# Patient Record
Sex: Female | Born: 1935 | Race: White | Hispanic: No | Marital: Married | Smoking: Former smoker
Health system: Southern US, Community
[De-identification: ages and names within clinical notes are randomized; demographics above are authoritative.]

## PROBLEM LIST (undated history)

## (undated) DIAGNOSIS — C50912 Malignant neoplasm of unspecified site of left female breast: Secondary | ICD-10-CM

## (undated) DIAGNOSIS — H3530 Unspecified macular degeneration: Secondary | ICD-10-CM

## (undated) DIAGNOSIS — Z923 Personal history of irradiation: Secondary | ICD-10-CM

## (undated) DIAGNOSIS — Z950 Presence of cardiac pacemaker: Secondary | ICD-10-CM

## (undated) DIAGNOSIS — T4145XA Adverse effect of unspecified anesthetic, initial encounter: Secondary | ICD-10-CM

## (undated) DIAGNOSIS — T8859XA Other complications of anesthesia, initial encounter: Secondary | ICD-10-CM

## (undated) DIAGNOSIS — C4492 Squamous cell carcinoma of skin, unspecified: Secondary | ICD-10-CM

## (undated) DIAGNOSIS — I219 Acute myocardial infarction, unspecified: Secondary | ICD-10-CM

## (undated) DIAGNOSIS — D649 Anemia, unspecified: Secondary | ICD-10-CM

## (undated) DIAGNOSIS — Z8679 Personal history of other diseases of the circulatory system: Secondary | ICD-10-CM

## (undated) DIAGNOSIS — M1990 Unspecified osteoarthritis, unspecified site: Secondary | ICD-10-CM

## (undated) DIAGNOSIS — Z87442 Personal history of urinary calculi: Secondary | ICD-10-CM

## (undated) DIAGNOSIS — G459 Transient cerebral ischemic attack, unspecified: Secondary | ICD-10-CM

## (undated) DIAGNOSIS — G629 Polyneuropathy, unspecified: Secondary | ICD-10-CM

## (undated) DIAGNOSIS — E119 Type 2 diabetes mellitus without complications: Secondary | ICD-10-CM

## (undated) DIAGNOSIS — E785 Hyperlipidemia, unspecified: Secondary | ICD-10-CM

## (undated) DIAGNOSIS — R011 Cardiac murmur, unspecified: Secondary | ICD-10-CM

## (undated) DIAGNOSIS — I499 Cardiac arrhythmia, unspecified: Secondary | ICD-10-CM

## (undated) DIAGNOSIS — I2510 Atherosclerotic heart disease of native coronary artery without angina pectoris: Secondary | ICD-10-CM

## (undated) DIAGNOSIS — T7840XA Allergy, unspecified, initial encounter: Secondary | ICD-10-CM

## (undated) DIAGNOSIS — I4891 Unspecified atrial fibrillation: Secondary | ICD-10-CM

## (undated) HISTORY — PX: CATARACT EXTRACTION: SUR2

## (undated) HISTORY — DX: Type 2 diabetes mellitus without complications: E11.9

## (undated) HISTORY — DX: Unspecified macular degeneration: H35.30

## (undated) HISTORY — DX: Polyneuropathy, unspecified: G62.9

## (undated) HISTORY — DX: Allergy, unspecified, initial encounter: T78.40XA

## (undated) HISTORY — PX: TONSILLECTOMY AND ADENOIDECTOMY: SUR1326

## (undated) HISTORY — DX: Unspecified atrial fibrillation: I48.91

## (undated) HISTORY — DX: Anemia, unspecified: D64.9

## (undated) HISTORY — DX: Transient cerebral ischemic attack, unspecified: G45.9

## (undated) HISTORY — DX: Acute myocardial infarction, unspecified: I21.9

## (undated) HISTORY — DX: Personal history of other diseases of the circulatory system: Z86.79

## (undated) HISTORY — PX: ABDOMINAL HYSTERECTOMY: SHX81

## (undated) HISTORY — PX: OTHER SURGICAL HISTORY: SHX169

## (undated) HISTORY — DX: Unspecified osteoarthritis, unspecified site: M19.90

## (undated) HISTORY — DX: Atherosclerotic heart disease of native coronary artery without angina pectoris: I25.10

## (undated) HISTORY — DX: Hyperlipidemia, unspecified: E78.5

## (undated) HISTORY — DX: Cardiac murmur, unspecified: R01.1

## (undated) HISTORY — PX: BLADDER SURGERY: SHX569

## (undated) HISTORY — DX: Presence of cardiac pacemaker: Z95.0

---
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
## 1986-01-18 HISTORY — PX: BREAST EXCISIONAL BIOPSY: SUR124

## 1986-09-14 HISTORY — PX: BREAST SURGERY: SHX581

## 1987-09-15 HISTORY — PX: CARDIAC CATHETERIZATION: SHX172

## 1994-09-14 HISTORY — PX: RECTOCELE REPAIR: SHX761

## 1999-07-07 ENCOUNTER — Ambulatory Visit (HOSPITAL_COMMUNITY): Admission: RE | Admit: 1999-07-07 | Discharge: 1999-07-07 | Payer: Self-pay | Admitting: Interventional Cardiology

## 2002-09-14 DIAGNOSIS — I4891 Unspecified atrial fibrillation: Secondary | ICD-10-CM

## 2002-09-14 HISTORY — DX: Unspecified atrial fibrillation: I48.91

## 2003-10-08 ENCOUNTER — Inpatient Hospital Stay (HOSPITAL_COMMUNITY): Admission: AD | Admit: 2003-10-08 | Discharge: 2003-10-11 | Payer: Self-pay | Admitting: Interventional Cardiology

## 2003-11-13 ENCOUNTER — Other Ambulatory Visit: Payer: Self-pay

## 2006-09-14 DIAGNOSIS — I219 Acute myocardial infarction, unspecified: Secondary | ICD-10-CM

## 2006-09-14 HISTORY — DX: Acute myocardial infarction, unspecified: I21.9

## 2006-09-14 HISTORY — PX: OTHER SURGICAL HISTORY: SHX169

## 2007-04-13 ENCOUNTER — Inpatient Hospital Stay: Payer: Self-pay | Admitting: Internal Medicine

## 2007-04-13 ENCOUNTER — Other Ambulatory Visit: Payer: Self-pay

## 2007-06-16 ENCOUNTER — Ambulatory Visit: Payer: Self-pay | Admitting: Internal Medicine

## 2008-09-14 LAB — HM COLONOSCOPY

## 2009-10-08 ENCOUNTER — Ambulatory Visit: Payer: Self-pay | Admitting: Internal Medicine

## 2010-06-14 DIAGNOSIS — Z950 Presence of cardiac pacemaker: Secondary | ICD-10-CM

## 2010-06-14 HISTORY — DX: Presence of cardiac pacemaker: Z95.0

## 2010-06-19 ENCOUNTER — Ambulatory Visit: Payer: Self-pay | Admitting: Cardiology

## 2010-06-24 ENCOUNTER — Ambulatory Visit: Payer: Self-pay | Admitting: Cardiology

## 2011-07-07 ENCOUNTER — Ambulatory Visit: Payer: Self-pay | Admitting: Internal Medicine

## 2011-07-08 ENCOUNTER — Ambulatory Visit: Payer: Self-pay | Admitting: Internal Medicine

## 2013-03-31 DIAGNOSIS — K922 Gastrointestinal hemorrhage, unspecified: Secondary | ICD-10-CM | POA: Insufficient documentation

## 2013-03-31 DIAGNOSIS — I341 Nonrheumatic mitral (valve) prolapse: Secondary | ICD-10-CM | POA: Insufficient documentation

## 2013-03-31 DIAGNOSIS — K5790 Diverticulosis of intestine, part unspecified, without perforation or abscess without bleeding: Secondary | ICD-10-CM | POA: Insufficient documentation

## 2013-09-20 DIAGNOSIS — E119 Type 2 diabetes mellitus without complications: Secondary | ICD-10-CM | POA: Diagnosis not present

## 2013-09-20 DIAGNOSIS — E785 Hyperlipidemia, unspecified: Secondary | ICD-10-CM | POA: Diagnosis not present

## 2013-09-20 LAB — HEPATIC FUNCTION PANEL
ALT: 23 U/L (ref 7–35)
AST: 23 U/L (ref 13–35)
Alkaline Phosphatase: 63 U/L (ref 25–125)
Bilirubin, Total: 0.4 mg/dL

## 2013-09-20 LAB — LIPID PANEL
Cholesterol: 237 mg/dL — AB (ref 0–200)
HDL: 62 mg/dL (ref 35–70)
LDL Cholesterol: 137 mg/dL
Triglycerides: 190 mg/dL — AB (ref 40–160)

## 2013-09-20 LAB — BASIC METABOLIC PANEL
BUN: 13 mg/dL (ref 4–21)
Creatinine: 0.8 mg/dL (ref 0.5–1.1)
Glucose: 141 mg/dL
Potassium: 4.9 mmol/L (ref 3.4–5.3)
Sodium: 140 mmol/L (ref 137–147)

## 2013-09-20 LAB — HEMOGLOBIN A1C: Hgb A1c MFr Bld: 7.4 % — AB (ref 4.0–6.0)

## 2013-09-22 DIAGNOSIS — H353290 Exudative age-related macular degeneration, unspecified eye, stage unspecified: Secondary | ICD-10-CM | POA: Diagnosis not present

## 2013-09-26 DIAGNOSIS — I495 Sick sinus syndrome: Secondary | ICD-10-CM | POA: Diagnosis not present

## 2013-09-27 DIAGNOSIS — E119 Type 2 diabetes mellitus without complications: Secondary | ICD-10-CM | POA: Diagnosis not present

## 2013-09-27 DIAGNOSIS — I259 Chronic ischemic heart disease, unspecified: Secondary | ICD-10-CM | POA: Diagnosis not present

## 2013-10-06 DIAGNOSIS — H353290 Exudative age-related macular degeneration, unspecified eye, stage unspecified: Secondary | ICD-10-CM | POA: Diagnosis not present

## 2013-10-12 DIAGNOSIS — D485 Neoplasm of uncertain behavior of skin: Secondary | ICD-10-CM | POA: Diagnosis not present

## 2013-10-12 DIAGNOSIS — C44721 Squamous cell carcinoma of skin of unspecified lower limb, including hip: Secondary | ICD-10-CM | POA: Diagnosis not present

## 2013-10-24 DIAGNOSIS — K922 Gastrointestinal hemorrhage, unspecified: Secondary | ICD-10-CM | POA: Diagnosis not present

## 2013-10-24 DIAGNOSIS — D509 Iron deficiency anemia, unspecified: Secondary | ICD-10-CM | POA: Diagnosis not present

## 2013-11-03 DIAGNOSIS — H353290 Exudative age-related macular degeneration, unspecified eye, stage unspecified: Secondary | ICD-10-CM | POA: Diagnosis not present

## 2013-11-20 DIAGNOSIS — H353290 Exudative age-related macular degeneration, unspecified eye, stage unspecified: Secondary | ICD-10-CM | POA: Diagnosis not present

## 2013-11-23 DIAGNOSIS — C44721 Squamous cell carcinoma of skin of unspecified lower limb, including hip: Secondary | ICD-10-CM | POA: Diagnosis not present

## 2013-12-11 DIAGNOSIS — R079 Chest pain, unspecified: Secondary | ICD-10-CM | POA: Diagnosis not present

## 2013-12-11 DIAGNOSIS — I2510 Atherosclerotic heart disease of native coronary artery without angina pectoris: Secondary | ICD-10-CM | POA: Diagnosis not present

## 2013-12-11 DIAGNOSIS — I10 Essential (primary) hypertension: Secondary | ICD-10-CM | POA: Diagnosis not present

## 2013-12-11 DIAGNOSIS — I4891 Unspecified atrial fibrillation: Secondary | ICD-10-CM | POA: Diagnosis not present

## 2013-12-19 DIAGNOSIS — R079 Chest pain, unspecified: Secondary | ICD-10-CM | POA: Diagnosis not present

## 2013-12-25 DIAGNOSIS — H353290 Exudative age-related macular degeneration, unspecified eye, stage unspecified: Secondary | ICD-10-CM | POA: Diagnosis not present

## 2014-01-01 DIAGNOSIS — H353290 Exudative age-related macular degeneration, unspecified eye, stage unspecified: Secondary | ICD-10-CM | POA: Diagnosis not present

## 2014-01-01 DIAGNOSIS — E119 Type 2 diabetes mellitus without complications: Secondary | ICD-10-CM | POA: Diagnosis not present

## 2014-01-01 DIAGNOSIS — I10 Essential (primary) hypertension: Secondary | ICD-10-CM | POA: Diagnosis not present

## 2014-01-01 DIAGNOSIS — I4891 Unspecified atrial fibrillation: Secondary | ICD-10-CM | POA: Diagnosis not present

## 2014-01-08 ENCOUNTER — Ambulatory Visit (INDEPENDENT_AMBULATORY_CARE_PROVIDER_SITE_OTHER): Payer: Medicare Other

## 2014-01-08 ENCOUNTER — Ambulatory Visit: Payer: Medicare Other | Admitting: Podiatry

## 2014-01-08 ENCOUNTER — Encounter: Payer: Self-pay | Admitting: Podiatry

## 2014-01-08 VITALS — Resp 16 | Ht 64.0 in | Wt 163.0 lb

## 2014-01-08 DIAGNOSIS — B351 Tinea unguium: Secondary | ICD-10-CM

## 2014-01-08 DIAGNOSIS — E1149 Type 2 diabetes mellitus with other diabetic neurological complication: Secondary | ICD-10-CM

## 2014-01-08 DIAGNOSIS — M79609 Pain in unspecified limb: Secondary | ICD-10-CM | POA: Diagnosis not present

## 2014-01-08 DIAGNOSIS — Q828 Other specified congenital malformations of skin: Secondary | ICD-10-CM | POA: Diagnosis not present

## 2014-01-08 DIAGNOSIS — M79673 Pain in unspecified foot: Secondary | ICD-10-CM

## 2014-01-08 NOTE — Progress Notes (Signed)
   Subjective:    Patient ID: Holly Rose, female    DOB: 29-Mar-1930, 80 y.o.   MRN: [id number]  HPI Comments: Its both of my feet that hurt. They have been hurting for about 3 yrs and they are getting worse. i have callus on both as well. i soak my feet and try to pull the hard skin off. They hurt with shoes.   Foot Pain Associated symptoms include fatigue and numbness.      Review of Systems  Constitutional: Positive for fatigue.  HENT:       Sinus problems  Eyes: Positive for visual disturbance.  Respiratory: Negative.   Cardiovascular: Positive for palpitations.  Endocrine: Positive for cold intolerance.  Musculoskeletal:       Joint pain  Neurological: Positive for numbness.  All other systems reviewed and are negative.      Objective:   Physical Exam: I have reviewed her past medical history medications allergies surgeries social history and review of systems. Pulses are strongly palpable bilateral. Neurologic since him a slightly decreased per since once the monofilament to the level of the midfoot. Deep tendon reflexes are intact bilateral brisk and equal. Muscle strength is 5 over 5 dorsiflexors plantar flexors inverters everters all intrinsic musculature is intact. Orthopedic evaluation does demonstrate mild hammertoe deformities bilateral with plantar flexed metatarsals resulting in reactive hyperkeratosis. Cutaneous evaluation demonstrates supple well hydrated cutis her toenails are thick yellow dystrophic onychomycotic painful palpation as well as debridement. She also has plantar reactive hyperkeratosis to the plantar aspect of the bilateral forefoot.        Assessment & Plan:  Assessment: Diabetes with diabetic peripheral neuropathy. Pain in limb secondary to onychomycosis bilateral. Pain in limb secondary to porokeratosis plantar aspect of the bilateral foot.  Plan: Debridement all reactive hyperkeratosis. Debridement all nails 1 through 5 bilateral secondary to  pain. Dispensed silicone metatarsal heads bilateral.

## 2014-01-10 DIAGNOSIS — J019 Acute sinusitis, unspecified: Secondary | ICD-10-CM | POA: Diagnosis not present

## 2014-01-29 DIAGNOSIS — H353290 Exudative age-related macular degeneration, unspecified eye, stage unspecified: Secondary | ICD-10-CM | POA: Diagnosis not present

## 2014-02-02 DIAGNOSIS — H353290 Exudative age-related macular degeneration, unspecified eye, stage unspecified: Secondary | ICD-10-CM | POA: Diagnosis not present

## 2014-02-26 DIAGNOSIS — H353290 Exudative age-related macular degeneration, unspecified eye, stage unspecified: Secondary | ICD-10-CM | POA: Diagnosis not present

## 2014-02-28 DIAGNOSIS — E119 Type 2 diabetes mellitus without complications: Secondary | ICD-10-CM | POA: Diagnosis not present

## 2014-02-28 DIAGNOSIS — E789 Disorder of lipoprotein metabolism, unspecified: Secondary | ICD-10-CM | POA: Diagnosis not present

## 2014-02-28 DIAGNOSIS — R7989 Other specified abnormal findings of blood chemistry: Secondary | ICD-10-CM | POA: Diagnosis not present

## 2014-03-01 LAB — HEMOGLOBIN A1C: Hgb A1c MFr Bld: 8.3 % — AB (ref 4.0–6.0)

## 2014-03-01 LAB — CBC AND DIFFERENTIAL
HCT: 43 % (ref 36–46)
Hemoglobin: 14.2 g/dL (ref 12.0–16.0)
Platelets: 194 10*3/uL (ref 150–399)
WBC: 6.3 10^3/mL

## 2014-03-01 LAB — HEPATIC FUNCTION PANEL
ALT: 26 U/L (ref 7–35)
AST: 25 U/L (ref 13–35)
Alkaline Phosphatase: 61 U/L (ref 25–125)
Bilirubin, Total: 0.4 mg/dL

## 2014-03-01 LAB — LIPID PANEL
Cholesterol: 204 mg/dL — AB (ref 0–200)
HDL: 60 mg/dL (ref 35–70)
LDL Cholesterol: 110 mg/dL
Triglycerides: 168 mg/dL — AB (ref 40–160)

## 2014-03-01 LAB — BASIC METABOLIC PANEL
BUN: 13 mg/dL (ref 4–21)
Creatinine: 0.8 mg/dL (ref 0.5–1.1)
Glucose: 141 mg/dL
Potassium: 4.6 mmol/L (ref 3.4–5.3)
Sodium: 138 mmol/L (ref 137–147)

## 2014-03-06 DIAGNOSIS — H353290 Exudative age-related macular degeneration, unspecified eye, stage unspecified: Secondary | ICD-10-CM | POA: Diagnosis not present

## 2014-03-07 DIAGNOSIS — E119 Type 2 diabetes mellitus without complications: Secondary | ICD-10-CM | POA: Diagnosis not present

## 2014-03-07 DIAGNOSIS — I4891 Unspecified atrial fibrillation: Secondary | ICD-10-CM | POA: Diagnosis not present

## 2014-03-07 DIAGNOSIS — I259 Chronic ischemic heart disease, unspecified: Secondary | ICD-10-CM | POA: Diagnosis not present

## 2014-03-07 DIAGNOSIS — G589 Mononeuropathy, unspecified: Secondary | ICD-10-CM | POA: Diagnosis not present

## 2014-03-30 DIAGNOSIS — H353290 Exudative age-related macular degeneration, unspecified eye, stage unspecified: Secondary | ICD-10-CM | POA: Diagnosis not present

## 2014-04-03 DIAGNOSIS — I495 Sick sinus syndrome: Secondary | ICD-10-CM | POA: Diagnosis not present

## 2014-04-05 DIAGNOSIS — D500 Iron deficiency anemia secondary to blood loss (chronic): Secondary | ICD-10-CM | POA: Diagnosis not present

## 2014-04-05 DIAGNOSIS — D509 Iron deficiency anemia, unspecified: Secondary | ICD-10-CM | POA: Diagnosis not present

## 2014-04-09 ENCOUNTER — Ambulatory Visit (INDEPENDENT_AMBULATORY_CARE_PROVIDER_SITE_OTHER): Payer: Medicare Other | Admitting: Podiatry

## 2014-04-09 ENCOUNTER — Encounter: Payer: Self-pay | Admitting: Podiatry

## 2014-04-09 DIAGNOSIS — E1149 Type 2 diabetes mellitus with other diabetic neurological complication: Secondary | ICD-10-CM | POA: Diagnosis not present

## 2014-04-09 DIAGNOSIS — G5790 Unspecified mononeuropathy of unspecified lower limb: Secondary | ICD-10-CM

## 2014-04-09 DIAGNOSIS — B351 Tinea unguium: Secondary | ICD-10-CM | POA: Diagnosis not present

## 2014-04-09 DIAGNOSIS — M79609 Pain in unspecified limb: Secondary | ICD-10-CM | POA: Diagnosis not present

## 2014-04-09 DIAGNOSIS — Q828 Other specified congenital malformations of skin: Secondary | ICD-10-CM | POA: Diagnosis not present

## 2014-04-09 DIAGNOSIS — M79676 Pain in unspecified toe(s): Secondary | ICD-10-CM

## 2014-04-09 MED ORDER — PREGABALIN 50 MG PO CAPS: 50.0000 mg | ORAL_CAPSULE | Freq: Two times a day (BID) | ORAL | Status: DC

## 2014-04-09 NOTE — Progress Notes (Signed)
She presents today chief complaint of painful burning aching feet. A sharp incurvated nail margin tibial border hallux left. Painfully elongated toenails one through 5 bilateral.  Objective: Vital signs are stable she is alert and oriented x3 pulses are palpable bilateral. No change in neurological findings. Nails are thick yellow dystrophic onychomycotic. Split ingrown nail hallux left.  Assessment: Pain in limb secondary to diabetic peripheral neuropathy and painful elongated toenails one through 5 bilateral. Ingrown nail tibial border hallux left.  Plan: Debridement nails 1 through 5 bilateral today started her layer. I did expressed to her that Lyrica and gabapentin were 1 of the 2 drugs that she could not take however she would like to try these again stating that she had no major side effects other than dizziness. She also goes on to state that she never gave in time work properly. So I dispensed a prescription for Lyrica 50 mg 1 twice daily.  The next him he comes in for med check we will perform a partial permanent matrixectomy tibial border hallux left.

## 2014-04-16 DIAGNOSIS — H353290 Exudative age-related macular degeneration, unspecified eye, stage unspecified: Secondary | ICD-10-CM | POA: Diagnosis not present

## 2014-05-03 DIAGNOSIS — H353290 Exudative age-related macular degeneration, unspecified eye, stage unspecified: Secondary | ICD-10-CM | POA: Diagnosis not present

## 2014-05-07 ENCOUNTER — Ambulatory Visit (INDEPENDENT_AMBULATORY_CARE_PROVIDER_SITE_OTHER): Payer: Medicare Other | Admitting: Podiatry

## 2014-05-07 VITALS — BP 123/67 | HR 89 | Resp 16

## 2014-05-07 DIAGNOSIS — Z79899 Other long term (current) drug therapy: Secondary | ICD-10-CM

## 2014-05-07 NOTE — Progress Notes (Signed)
She presents today stating that her toe is doing fine she does not want a matrixectomy at this point in time she refers to the hallux left. She's also started Lyrica recently and states that she's approximately 75% improved with 1 heel at nighttime.  Objective: Vital signs are stable she is alert and oriented x3. Pulses are palpable bilateral. No erythema edema cellulitis drainage or odor to the hallux nail plate left.  Assessment: Neuropathy unchanged on physical exam. 75% improved her patient.  Plan: Continue Lyrica and will followup with me in one month.

## 2014-05-14 DIAGNOSIS — H353290 Exudative age-related macular degeneration, unspecified eye, stage unspecified: Secondary | ICD-10-CM | POA: Diagnosis not present

## 2014-06-04 ENCOUNTER — Ambulatory Visit (INDEPENDENT_AMBULATORY_CARE_PROVIDER_SITE_OTHER): Payer: Medicare Other | Admitting: Podiatry

## 2014-06-04 DIAGNOSIS — B351 Tinea unguium: Secondary | ICD-10-CM

## 2014-06-04 DIAGNOSIS — M79676 Pain in unspecified toe(s): Secondary | ICD-10-CM

## 2014-06-04 DIAGNOSIS — Q828 Other specified congenital malformations of skin: Secondary | ICD-10-CM | POA: Diagnosis not present

## 2014-06-04 DIAGNOSIS — M79609 Pain in unspecified limb: Secondary | ICD-10-CM | POA: Diagnosis not present

## 2014-06-04 NOTE — Progress Notes (Signed)
She presents today for followup of her painful elongated toenails and to discuss Lyrica. She states is alert and seems to be working for her that she's only taken one at nighttime. She also complaining of painful elongated toenails one through 5 bilateral.  Objective: Vital signs are stable she is alert and oriented x3.  Assessment: Pain in limb secondary onychomycosis and neuropathy.  Plan: Continue Lyrica regularly. Debridement nails 1 through 5 bilateral.

## 2014-06-06 DIAGNOSIS — H353290 Exudative age-related macular degeneration, unspecified eye, stage unspecified: Secondary | ICD-10-CM | POA: Diagnosis not present

## 2014-06-13 DIAGNOSIS — I4891 Unspecified atrial fibrillation: Secondary | ICD-10-CM | POA: Diagnosis not present

## 2014-06-13 DIAGNOSIS — H353290 Exudative age-related macular degeneration, unspecified eye, stage unspecified: Secondary | ICD-10-CM | POA: Diagnosis not present

## 2014-06-13 DIAGNOSIS — E119 Type 2 diabetes mellitus without complications: Secondary | ICD-10-CM | POA: Diagnosis not present

## 2014-06-13 DIAGNOSIS — J019 Acute sinusitis, unspecified: Secondary | ICD-10-CM | POA: Diagnosis not present

## 2014-06-14 LAB — HEMOGLOBIN A1C: Hgb A1c MFr Bld: 8 % — AB (ref 4.0–6.0)

## 2014-06-14 LAB — BASIC METABOLIC PANEL
BUN: 15 mg/dL (ref 4–21)
Creatinine: 0.8 mg/dL (ref 0.5–1.1)
Glucose: 144 mg/dL
Potassium: 4.6 mmol/L (ref 3.4–5.3)
Sodium: 136 mmol/L — AB (ref 137–147)

## 2014-06-14 LAB — LIPID PANEL
Cholesterol: 219 mg/dL — AB (ref 0–200)
HDL: 58 mg/dL (ref 35–70)
LDL Cholesterol: 128 mg/dL
Triglycerides: 163 mg/dL — AB (ref 40–160)

## 2014-06-14 LAB — HEPATIC FUNCTION PANEL
ALT: 26 U/L (ref 7–35)
AST: 20 U/L (ref 13–35)
Alkaline Phosphatase: 65 U/L (ref 25–125)
Bilirubin, Total: 0.3 mg/dL

## 2014-06-26 DIAGNOSIS — I4891 Unspecified atrial fibrillation: Secondary | ICD-10-CM | POA: Diagnosis not present

## 2014-06-26 DIAGNOSIS — Z23 Encounter for immunization: Secondary | ICD-10-CM | POA: Diagnosis not present

## 2014-06-26 DIAGNOSIS — E119 Type 2 diabetes mellitus without complications: Secondary | ICD-10-CM | POA: Diagnosis not present

## 2014-06-26 DIAGNOSIS — I259 Chronic ischemic heart disease, unspecified: Secondary | ICD-10-CM | POA: Diagnosis not present

## 2014-07-04 DIAGNOSIS — H3532 Exudative age-related macular degeneration: Secondary | ICD-10-CM | POA: Diagnosis not present

## 2014-07-09 ENCOUNTER — Ambulatory Visit (INDEPENDENT_AMBULATORY_CARE_PROVIDER_SITE_OTHER): Payer: Medicare Other | Admitting: Podiatry

## 2014-07-09 ENCOUNTER — Encounter: Payer: Self-pay | Admitting: Podiatry

## 2014-07-09 DIAGNOSIS — B351 Tinea unguium: Secondary | ICD-10-CM

## 2014-07-09 DIAGNOSIS — M79676 Pain in unspecified toe(s): Secondary | ICD-10-CM | POA: Diagnosis not present

## 2014-07-09 NOTE — Progress Notes (Signed)
Presents today chief complaint of painful elongated toenails.  Objective: Pulses are palpable bilateral nails are thick, yellow dystrophic onychomycosis and painful palpation.   Assessment: Onychomycosis with pain in limb.  Plan: Treatment of nails in thickness and length as covered service secondary to pain.  

## 2014-07-13 DIAGNOSIS — E782 Mixed hyperlipidemia: Secondary | ICD-10-CM | POA: Diagnosis not present

## 2014-07-13 DIAGNOSIS — E119 Type 2 diabetes mellitus without complications: Secondary | ICD-10-CM | POA: Diagnosis not present

## 2014-07-13 DIAGNOSIS — I4891 Unspecified atrial fibrillation: Secondary | ICD-10-CM | POA: Diagnosis not present

## 2014-07-13 DIAGNOSIS — E1169 Type 2 diabetes mellitus with other specified complication: Secondary | ICD-10-CM | POA: Insufficient documentation

## 2014-08-02 DIAGNOSIS — H6063 Unspecified chronic otitis externa, bilateral: Secondary | ICD-10-CM | POA: Diagnosis not present

## 2014-08-02 DIAGNOSIS — H6120 Impacted cerumen, unspecified ear: Secondary | ICD-10-CM | POA: Diagnosis not present

## 2014-08-06 DIAGNOSIS — H3532 Exudative age-related macular degeneration: Secondary | ICD-10-CM | POA: Diagnosis not present

## 2014-08-07 DIAGNOSIS — K922 Gastrointestinal hemorrhage, unspecified: Secondary | ICD-10-CM | POA: Diagnosis not present

## 2014-08-07 DIAGNOSIS — G4700 Insomnia, unspecified: Secondary | ICD-10-CM | POA: Diagnosis not present

## 2014-08-07 DIAGNOSIS — Z6832 Body mass index (BMI) 32.0-32.9, adult: Secondary | ICD-10-CM | POA: Diagnosis not present

## 2014-08-07 DIAGNOSIS — D500 Iron deficiency anemia secondary to blood loss (chronic): Secondary | ICD-10-CM | POA: Diagnosis not present

## 2014-08-13 DIAGNOSIS — H3532 Exudative age-related macular degeneration: Secondary | ICD-10-CM | POA: Diagnosis not present

## 2014-08-20 ENCOUNTER — Ambulatory Visit: Payer: Medicare Other | Admitting: Podiatry

## 2014-09-04 DIAGNOSIS — I495 Sick sinus syndrome: Secondary | ICD-10-CM | POA: Diagnosis not present

## 2014-09-05 DIAGNOSIS — H3532 Exudative age-related macular degeneration: Secondary | ICD-10-CM | POA: Diagnosis not present

## 2014-09-10 DIAGNOSIS — H3532 Exudative age-related macular degeneration: Secondary | ICD-10-CM | POA: Diagnosis not present

## 2014-10-01 ENCOUNTER — Ambulatory Visit: Payer: Medicare Other | Admitting: Podiatry

## 2014-10-01 ENCOUNTER — Ambulatory Visit (INDEPENDENT_AMBULATORY_CARE_PROVIDER_SITE_OTHER): Payer: Medicare Other | Admitting: Podiatry

## 2014-10-01 DIAGNOSIS — H3532 Exudative age-related macular degeneration: Secondary | ICD-10-CM | POA: Diagnosis not present

## 2014-10-01 DIAGNOSIS — M79676 Pain in unspecified toe(s): Secondary | ICD-10-CM

## 2014-10-01 DIAGNOSIS — B351 Tinea unguium: Secondary | ICD-10-CM | POA: Diagnosis not present

## 2014-10-01 NOTE — Progress Notes (Signed)
Presents today chief complaint of painful elongated toenails.  Objective: Pulses are palpable bilateral nails are thick, yellow dystrophic onychomycosis and painful palpation.   Assessment: Onychomycosis with pain in limb.  Plan: Treatment of nails in thickness and length as covered service secondary to pain.  

## 2014-10-08 DIAGNOSIS — H3532 Exudative age-related macular degeneration: Secondary | ICD-10-CM | POA: Diagnosis not present

## 2014-10-24 ENCOUNTER — Ambulatory Visit (INDEPENDENT_AMBULATORY_CARE_PROVIDER_SITE_OTHER): Payer: Medicare Other | Admitting: Podiatry

## 2014-10-24 ENCOUNTER — Encounter: Payer: Self-pay | Admitting: Podiatry

## 2014-10-24 VITALS — BP 142/79 | HR 92 | Resp 12

## 2014-10-24 DIAGNOSIS — G5790 Unspecified mononeuropathy of unspecified lower limb: Secondary | ICD-10-CM

## 2014-10-24 DIAGNOSIS — E118 Type 2 diabetes mellitus with unspecified complications: Secondary | ICD-10-CM

## 2014-10-24 DIAGNOSIS — E11621 Type 2 diabetes mellitus with foot ulcer: Secondary | ICD-10-CM

## 2014-10-24 DIAGNOSIS — L89890 Pressure ulcer of other site, unstageable: Secondary | ICD-10-CM

## 2014-10-24 DIAGNOSIS — E1142 Type 2 diabetes mellitus with diabetic polyneuropathy: Secondary | ICD-10-CM

## 2014-10-24 NOTE — Progress Notes (Signed)
She presents today with chief complaint of a painful third toe on her left foot.  Objective: Vital signs are stable she is alert and oriented 3 she has a distal clavus with superficial ulceration. Cellulitis does not extend to the level of the DIPJ appears to be local. I debrided all reactive hyperkeratosis today down the bleeding and the toe appears to be in good position other than the rigid hammertoe deformity.  Assessment: Hammertoe deformity resulting in a distal clavus diabetic ulceration.  Plan: Debrided to bleeding today instructed on soaking Epsom salts and warm water application of sulcus pad and dressing. Follow up with her on Monday if necessary otherwise

## 2014-10-29 ENCOUNTER — Ambulatory Visit: Payer: Medicare Other | Admitting: Podiatry

## 2014-10-31 ENCOUNTER — Encounter: Payer: Self-pay | Admitting: Podiatry

## 2014-10-31 ENCOUNTER — Ambulatory Visit (INDEPENDENT_AMBULATORY_CARE_PROVIDER_SITE_OTHER): Payer: Medicare Other | Admitting: Podiatry

## 2014-10-31 VITALS — BP 139/79 | HR 87 | Resp 12

## 2014-10-31 DIAGNOSIS — G5790 Unspecified mononeuropathy of unspecified lower limb: Secondary | ICD-10-CM

## 2014-10-31 DIAGNOSIS — M779 Enthesopathy, unspecified: Secondary | ICD-10-CM

## 2014-10-31 DIAGNOSIS — M778 Other enthesopathies, not elsewhere classified: Secondary | ICD-10-CM

## 2014-10-31 DIAGNOSIS — E11621 Type 2 diabetes mellitus with foot ulcer: Secondary | ICD-10-CM

## 2014-10-31 DIAGNOSIS — L89891 Pressure ulcer of other site, stage 1: Secondary | ICD-10-CM | POA: Diagnosis not present

## 2014-10-31 DIAGNOSIS — H3532 Exudative age-related macular degeneration: Secondary | ICD-10-CM | POA: Diagnosis not present

## 2014-10-31 DIAGNOSIS — E118 Type 2 diabetes mellitus with unspecified complications: Secondary | ICD-10-CM

## 2014-10-31 DIAGNOSIS — M7751 Other enthesopathy of right foot: Secondary | ICD-10-CM

## 2014-10-31 NOTE — Progress Notes (Signed)
She presents today for follow-up of ulceration distal aspect of the third digit left foot. She states it is doing much better but is still little tender. She is also having pain between the first and second metatarsophalangeal joints of the right foot.  Objective: Vital signs are stable she is alert and oriented 3. Pulses are palpable bilateral. Hammertoe deformities of the left foot resulting in a distal clavus with superficial ulceration which appears to be now healing without signs of infection to the third digit of the left foot. Distal clavus was debrided today. No signs of infection. She has pain on palpation of the fibular sesamoid and the plantar medial aspect of the second metatarsophalangeal joint of the right foot.  Assessment: Sesamoiditis capsulitis right foot. Hammertoe deformity with ulceration distal clavus third left.  Plan: Debrided all reactive hyperkeratosis today and injected her right foot. I will follow up with her in about 3 weeks but she is to continue all conservative therapies.

## 2014-11-05 DIAGNOSIS — H3532 Exudative age-related macular degeneration: Secondary | ICD-10-CM | POA: Diagnosis not present

## 2014-11-14 ENCOUNTER — Ambulatory Visit (INDEPENDENT_AMBULATORY_CARE_PROVIDER_SITE_OTHER): Payer: Medicare Other | Admitting: Podiatry

## 2014-11-14 VITALS — BP 122/82 | HR 94 | Resp 16

## 2014-11-14 DIAGNOSIS — E11621 Type 2 diabetes mellitus with foot ulcer: Secondary | ICD-10-CM

## 2014-11-14 DIAGNOSIS — L89891 Pressure ulcer of other site, stage 1: Secondary | ICD-10-CM

## 2014-11-14 DIAGNOSIS — G5790 Unspecified mononeuropathy of unspecified lower limb: Secondary | ICD-10-CM

## 2014-11-14 DIAGNOSIS — L97529 Non-pressure chronic ulcer of other part of left foot with unspecified severity: Secondary | ICD-10-CM

## 2014-11-15 NOTE — Progress Notes (Signed)
She presents today for follow-up of ulceration to the third digit of her left foot. She states that the third toe on the right foot appears to be getting a little red as well. Concerned she may be developing another ulceration. States the third toe on the left foot appears to be doing all right.  Objective: Vital signs are stable she's alert and oriented 3. Pulses remain palpable bilateral hammertoe deformities resulting in mild erythema to the distal tuft of the toe no ulceration or skin breakdown on the right foot however she still returned retains superficial ulceration third digit left foot.  Assessment: Superficial ulceration third digit left foot.  Plan: Debrided the wound today she will continue to treat this conservatively at home. Also dispensed silicone pads and I will follow-up with her in proximally a month.

## 2014-11-16 DIAGNOSIS — Z23 Encounter for immunization: Secondary | ICD-10-CM | POA: Diagnosis not present

## 2014-11-28 DIAGNOSIS — H3532 Exudative age-related macular degeneration: Secondary | ICD-10-CM | POA: Diagnosis not present

## 2014-11-30 DIAGNOSIS — E119 Type 2 diabetes mellitus without complications: Secondary | ICD-10-CM | POA: Diagnosis not present

## 2014-11-30 DIAGNOSIS — I4891 Unspecified atrial fibrillation: Secondary | ICD-10-CM | POA: Diagnosis not present

## 2014-11-30 DIAGNOSIS — E785 Hyperlipidemia, unspecified: Secondary | ICD-10-CM | POA: Diagnosis not present

## 2014-12-01 LAB — LIPID PANEL
Cholesterol: 221 mg/dL — AB (ref 0–200)
HDL: 73 mg/dL — AB (ref 35–70)
LDL Cholesterol: 119 mg/dL
Triglycerides: 146 mg/dL (ref 40–160)

## 2014-12-01 LAB — HEPATIC FUNCTION PANEL
ALT: 29 U/L (ref 7–35)
AST: 23 U/L (ref 13–35)
Alkaline Phosphatase: 55 U/L (ref 25–125)
Bilirubin, Total: 0.3 mg/dL

## 2014-12-01 LAB — HEMOGLOBIN A1C: Hgb A1c MFr Bld: 9.1 % — AB (ref 4.0–6.0)

## 2014-12-01 LAB — BASIC METABOLIC PANEL
BUN: 13 mg/dL (ref 4–21)
Creatinine: 0.7 mg/dL (ref 0.5–1.1)
Glucose: 164 mg/dL
Potassium: 4.5 mmol/L (ref 3.4–5.3)
Sodium: 140 mmol/L (ref 137–147)

## 2014-12-04 DIAGNOSIS — Z6831 Body mass index (BMI) 31.0-31.9, adult: Secondary | ICD-10-CM | POA: Diagnosis not present

## 2014-12-04 DIAGNOSIS — R197 Diarrhea, unspecified: Secondary | ICD-10-CM | POA: Diagnosis not present

## 2014-12-04 DIAGNOSIS — D509 Iron deficiency anemia, unspecified: Secondary | ICD-10-CM | POA: Diagnosis not present

## 2014-12-14 DIAGNOSIS — H3532 Exudative age-related macular degeneration: Secondary | ICD-10-CM | POA: Diagnosis not present

## 2014-12-17 ENCOUNTER — Encounter: Payer: Self-pay | Admitting: Podiatry

## 2014-12-17 ENCOUNTER — Ambulatory Visit (INDEPENDENT_AMBULATORY_CARE_PROVIDER_SITE_OTHER): Payer: Medicare Other | Admitting: Podiatry

## 2014-12-17 VITALS — BP 145/79 | HR 99 | Resp 16

## 2014-12-17 DIAGNOSIS — L89891 Pressure ulcer of other site, stage 1: Secondary | ICD-10-CM

## 2014-12-17 DIAGNOSIS — L97529 Non-pressure chronic ulcer of other part of left foot with unspecified severity: Principal | ICD-10-CM

## 2014-12-17 DIAGNOSIS — E11621 Type 2 diabetes mellitus with foot ulcer: Secondary | ICD-10-CM

## 2014-12-17 NOTE — Progress Notes (Signed)
She presents today for follow-up of the ulceration to the third digit left foot distal aspect. States that she's been sleeping with her dressing on for 2 days at a time.  Objective: Vital signs are stable she is alert and oriented 3. Distal aspect of the third toe demonstrates a distal clavus and superficial ulceration was debridement. No signs of cellulitis and the ulceration does not probe deep to bone.  Assessment: Hammertoe deformities left foot. Distal clavus with superficial ulceration third left.  Plan: Debridement of all reactive hyperkeratosis today follow up with her in 1 month

## 2014-12-26 DIAGNOSIS — I480 Paroxysmal atrial fibrillation: Secondary | ICD-10-CM | POA: Diagnosis not present

## 2014-12-26 DIAGNOSIS — E119 Type 2 diabetes mellitus without complications: Secondary | ICD-10-CM | POA: Diagnosis not present

## 2014-12-26 DIAGNOSIS — E782 Mixed hyperlipidemia: Secondary | ICD-10-CM | POA: Diagnosis not present

## 2014-12-26 DIAGNOSIS — I481 Persistent atrial fibrillation: Secondary | ICD-10-CM | POA: Diagnosis not present

## 2014-12-31 ENCOUNTER — Ambulatory Visit (INDEPENDENT_AMBULATORY_CARE_PROVIDER_SITE_OTHER): Payer: Medicare Other | Admitting: Podiatry

## 2014-12-31 DIAGNOSIS — M79676 Pain in unspecified toe(s): Secondary | ICD-10-CM | POA: Diagnosis not present

## 2014-12-31 DIAGNOSIS — L89891 Pressure ulcer of other site, stage 1: Secondary | ICD-10-CM

## 2014-12-31 DIAGNOSIS — B351 Tinea unguium: Secondary | ICD-10-CM

## 2014-12-31 DIAGNOSIS — E11621 Type 2 diabetes mellitus with foot ulcer: Secondary | ICD-10-CM

## 2014-12-31 DIAGNOSIS — L97529 Non-pressure chronic ulcer of other part of left foot with unspecified severity: Principal | ICD-10-CM

## 2014-12-31 MED ORDER — AMOXICILLIN-POT CLAVULANATE 875-125 MG PO TABS
1.0000 | ORAL_TABLET | Freq: Two times a day (BID) | ORAL | Status: DC
Start: 2014-12-31 — End: 2015-02-12

## 2014-12-31 NOTE — Progress Notes (Signed)
Presents today chief complaint of painful elongated toenails. And an ulcer fourth digit left foot which appears to be sort of swollen today and painful.  Objective: Pulses are palpable bilateral nails are thick, yellow dystrophic onychomycosis and painful palpation. Ulceration fourth digit left foot demonstrates distal clavus and the skin was removed was removed demonstrates no drainage or purulence no malodor. She does have cellulitis extending to the level of the DIPJ.  Assessment: Onychomycosis with pain in limb. Hammertoe deformity resulting in a distal clavus and superficial ulceration left foot.    Plan: Treatment of nails in thickness and length as covered service secondary to pain. All reactive hyperkeratotic tissue and started her on Augmentin 875 mg 1 by mouth twice a day 10 days follow up with her in 2 weeks.

## 2015-01-07 DIAGNOSIS — H3532 Exudative age-related macular degeneration: Secondary | ICD-10-CM | POA: Diagnosis not present

## 2015-01-08 DIAGNOSIS — E785 Hyperlipidemia, unspecified: Secondary | ICD-10-CM | POA: Diagnosis not present

## 2015-01-08 DIAGNOSIS — E119 Type 2 diabetes mellitus without complications: Secondary | ICD-10-CM | POA: Diagnosis not present

## 2015-01-14 ENCOUNTER — Ambulatory Visit: Payer: Medicare Other | Admitting: Podiatry

## 2015-01-14 DIAGNOSIS — H3532 Exudative age-related macular degeneration: Secondary | ICD-10-CM | POA: Diagnosis not present

## 2015-01-28 ENCOUNTER — Ambulatory Visit (INDEPENDENT_AMBULATORY_CARE_PROVIDER_SITE_OTHER): Payer: Medicare Other | Admitting: Podiatry

## 2015-01-28 DIAGNOSIS — L89891 Pressure ulcer of other site, stage 1: Secondary | ICD-10-CM

## 2015-01-28 DIAGNOSIS — L97529 Non-pressure chronic ulcer of other part of left foot with unspecified severity: Principal | ICD-10-CM

## 2015-01-28 DIAGNOSIS — E11621 Type 2 diabetes mellitus with foot ulcer: Secondary | ICD-10-CM | POA: Diagnosis not present

## 2015-01-28 MED ORDER — AMOXICILLIN 500 MG PO CAPS: 500.0000 mg | ORAL_CAPSULE | Freq: Two times a day (BID) | ORAL | Status: DC

## 2015-01-28 NOTE — Progress Notes (Signed)
She presents today for follow-up of her diabetic ulceration distal aspect third toe left foot. She states it is still moderately tender and she continues to soak and dressing daily.  Objective: Vital signs alert and oriented 3. Pulses are strongly palpable. Hammertoe deformity third digit left foot resulting in superficial ulceration does not probe to bone all cellulitis is noted no purulence and no malodor.  Assessment: Chronic diabetic ulceration third toe left foot.  Plan: Debridement of all reactive hyperkeratosis discussed surgical management today however her blood sugars are out of control at this point. She will start new medication in order to get her sugar control and we will follow up with her in a few weeks.

## 2015-02-04 DIAGNOSIS — H3532 Exudative age-related macular degeneration: Secondary | ICD-10-CM | POA: Diagnosis not present

## 2015-02-07 ENCOUNTER — Encounter: Payer: Self-pay | Admitting: *Deleted

## 2015-02-07 DIAGNOSIS — H0013 Chalazion right eye, unspecified eyelid: Secondary | ICD-10-CM | POA: Diagnosis not present

## 2015-02-12 ENCOUNTER — Encounter (INDEPENDENT_AMBULATORY_CARE_PROVIDER_SITE_OTHER): Payer: Self-pay

## 2015-02-12 ENCOUNTER — Ambulatory Visit (INDEPENDENT_AMBULATORY_CARE_PROVIDER_SITE_OTHER): Payer: Medicare Other | Admitting: Internal Medicine

## 2015-02-12 ENCOUNTER — Encounter: Payer: Self-pay | Admitting: Internal Medicine

## 2015-02-12 VITALS — BP 128/70 | HR 70 | Temp 98.2°F | Ht 62.75 in | Wt 175.1 lb

## 2015-02-12 DIAGNOSIS — Z0000 Encounter for general adult medical examination without abnormal findings: Secondary | ICD-10-CM

## 2015-02-12 DIAGNOSIS — Z658 Other specified problems related to psychosocial circumstances: Secondary | ICD-10-CM

## 2015-02-12 DIAGNOSIS — F439 Reaction to severe stress, unspecified: Secondary | ICD-10-CM

## 2015-02-12 DIAGNOSIS — Z1239 Encounter for other screening for malignant neoplasm of breast: Secondary | ICD-10-CM

## 2015-02-12 DIAGNOSIS — E1140 Type 2 diabetes mellitus with diabetic neuropathy, unspecified: Secondary | ICD-10-CM

## 2015-02-12 DIAGNOSIS — I4891 Unspecified atrial fibrillation: Secondary | ICD-10-CM | POA: Diagnosis not present

## 2015-02-12 DIAGNOSIS — H3530 Unspecified macular degeneration: Secondary | ICD-10-CM

## 2015-02-12 DIAGNOSIS — I2510 Atherosclerotic heart disease of native coronary artery without angina pectoris: Secondary | ICD-10-CM | POA: Diagnosis not present

## 2015-02-12 DIAGNOSIS — D509 Iron deficiency anemia, unspecified: Secondary | ICD-10-CM

## 2015-02-12 DIAGNOSIS — E7800 Pure hypercholesterolemia, unspecified: Secondary | ICD-10-CM

## 2015-02-12 DIAGNOSIS — R197 Diarrhea, unspecified: Secondary | ICD-10-CM

## 2015-02-12 MED ORDER — SITAGLIPTIN PHOSPHATE 100 MG PO TABS: 100.0000 mg | ORAL_TABLET | Freq: Every day | ORAL | Status: DC

## 2015-02-12 NOTE — Progress Notes (Signed)
Pre visit review using our clinic review tool, if applicable. No additional management support is needed unless otherwise documented below in the visit note. 

## 2015-02-15 DIAGNOSIS — H3532 Exudative age-related macular degeneration: Secondary | ICD-10-CM | POA: Diagnosis not present

## 2015-02-17 ENCOUNTER — Encounter: Payer: Self-pay | Admitting: Internal Medicine

## 2015-02-17 DIAGNOSIS — Z0000 Encounter for general adult medical examination without abnormal findings: Secondary | ICD-10-CM | POA: Insufficient documentation

## 2015-02-17 DIAGNOSIS — I25119 Atherosclerotic heart disease of native coronary artery with unspecified angina pectoris: Secondary | ICD-10-CM | POA: Insufficient documentation

## 2015-02-17 DIAGNOSIS — I2510 Atherosclerotic heart disease of native coronary artery without angina pectoris: Secondary | ICD-10-CM | POA: Insufficient documentation

## 2015-02-17 DIAGNOSIS — E7800 Pure hypercholesterolemia, unspecified: Secondary | ICD-10-CM | POA: Insufficient documentation

## 2015-02-17 DIAGNOSIS — H3530 Unspecified macular degeneration: Secondary | ICD-10-CM | POA: Insufficient documentation

## 2015-02-17 DIAGNOSIS — R197 Diarrhea, unspecified: Secondary | ICD-10-CM | POA: Insufficient documentation

## 2015-02-17 DIAGNOSIS — F439 Reaction to severe stress, unspecified: Secondary | ICD-10-CM | POA: Insufficient documentation

## 2015-02-17 DIAGNOSIS — E785 Hyperlipidemia, unspecified: Secondary | ICD-10-CM | POA: Insufficient documentation

## 2015-02-17 DIAGNOSIS — E1140 Type 2 diabetes mellitus with diabetic neuropathy, unspecified: Secondary | ICD-10-CM | POA: Insufficient documentation

## 2015-02-17 DIAGNOSIS — D509 Iron deficiency anemia, unspecified: Secondary | ICD-10-CM | POA: Insufficient documentation

## 2015-02-17 DIAGNOSIS — I4891 Unspecified atrial fibrillation: Secondary | ICD-10-CM | POA: Insufficient documentation

## 2015-02-17 DIAGNOSIS — I482 Chronic atrial fibrillation, unspecified: Secondary | ICD-10-CM | POA: Insufficient documentation

## 2015-02-17 NOTE — Assessment & Plan Note (Signed)
Increased stress.  Husband recently diagnosed with bladder cancer.  Follow.

## 2015-02-17 NOTE — Assessment & Plan Note (Signed)
Sees Dr Sterling Big.  Gets iron infusions intermittently.  Last hgb wnl.  Has had colonoscopy - 2010.  EGD 2014.  Follow.

## 2015-02-17 NOTE — Assessment & Plan Note (Signed)
Last a1c 9.0.  On amaryl and metformin.  Has decreased dose of metformin.  Start [place] 100mg  q day.  Follow sugars.  Monitor for lows.  Check metabolic panel and K4M.  Is up to date with eye exams.

## 2015-02-17 NOTE — Assessment & Plan Note (Signed)
Followed by opthalmology.  Limited vision.  Does not drive.

## 2015-02-17 NOTE — Progress Notes (Signed)
Patient ID: Holly Rose, female   DOB: 1930-08-28, 81 y.o.   MRN: [id number]   Subjective:    Patient ID: Holly Rose, female    DOB: 19-Apr-1930, 81 y.o.   MRN: [id number]  HPI  Patient here to establish care.  Accompanied by her daughter-n-law.  History obtained from both of them.  Has a history of CAD and atrial fib. Sees Dr Ubaldo Glassing.  States doing well from a cardiac standpoint.  Has been followed by Dr Hall Busing.  He has been following her diabetes.  Last a1c elevated - 9.0.  On metformin.  Has recently decreased the dose secondary to diarrhea.  Diarrhea resolved since decreasing the dose.  Having regular bowel movements now.  Also on amaryl.   Previously on Farxiga.  Too expensive.  Did not start victoza.  Has samples.  States sugars are averaging 97-150 when checked.  Lives at the [facility].  Does not drive.  Has macular degeneration.  Sees opthalmology regularly.  Increased stress.  Husband just recently diagnosed with bladder cancer.  Does have peripheral neuropathy.  Affects her gait.  Is unsteady.  Also has a toe ulcer.  Followed by Dr Milinda Pointer.  Tried lyrica and neurontin for her neuropathy.  Did not tolerate.  States made her crazy.  Breathing stable.  States is followed by Dr Sterling Big for her anemia.  Previous GI bleed.  Had colonoscopy 2010.  Has to periodically get iron infusions.  Most recent hgb wnl.  Is s/p pacemaker placement for sick sinus syndrome.     Past Medical History  Diagnosis Date  . Diabetes   . Atrial fibrillation   . Hyperlipidemia   . Anemia   . Neuropathy   . TIA (transient ischemic attack)   . Arthritis   . Cancer     H/O skin cancer  . Allergy   . Heart murmur   . Stroke   . CAD (coronary artery disease)   . History of sick sinus syndrome     s/p pacemaker placement  . Neuropathy   . Macular degeneration     Current Outpatient Prescriptions on File Prior to Visit  Medication Sig Dispense Refill  . aspirin 81 MG tablet Take 81 mg by mouth  daily.    . digoxin (LANOXIN) 0.125 MG tablet     . furosemide (LASIX) 20 MG tablet Take 20 mg by mouth.    Marland Kitchen glimepiride (AMARYL) 4 MG tablet     . metFORMIN (GLUCOPHAGE) 1000 MG tablet     . metoprolol succinate (TOPROL-XL) 50 MG 24 hr tablet Take 50 mg by mouth daily. Take with or immediately following a meal.    . Multiple Vitamins-Minerals (MULTIVITAMIN PO) Take by mouth daily.    . vitamin B-12 (CYANOCOBALAMIN) 1000 MCG tablet Take 1,000 mcg by mouth daily.     No current facility-administered medications on file prior to visit.    Review of Systems  Constitutional: Negative for appetite change and unexpected weight change.  HENT: Negative for congestion and sinus pressure.   Eyes:       Decreased vision.  Sees opthalmology regularly.  Has macular degeneration.   Respiratory: Negative for cough, chest tightness and shortness of breath.   Cardiovascular: Negative for chest pain, palpitations and leg swelling.  Gastrointestinal: Positive for diarrhea (resolved now.  normal bowel movements now. ). Negative for nausea, vomiting and abdominal pain.  Genitourinary: Negative for dysuria and difficulty urinating.  Musculoskeletal: Negative for joint swelling.  Pain in her feet from her neuropathy.   Skin: Negative for rash.       Toe ulceration.   Neurological: Negative for dizziness, light-headedness and headaches.  Hematological: Negative for adenopathy. Does not bruise/bleed easily.  Psychiatric/Behavioral: Negative for dysphoric mood and agitation.       Objective:     Blood pressure recheck:  142/78, pulse 68  Physical Exam  Constitutional: She appears well-developed and well-nourished. No distress.  HENT:  Nose: Nose normal.  Mouth/Throat: Oropharynx is clear and moist.  Eyes: Right eye exhibits no discharge. Left eye exhibits no discharge. No scleral icterus.  Neck: Neck supple. No thyromegaly present.  Cardiovascular: Normal rate.   Rate controlled.  Has afib.     Pulmonary/Chest: Breath sounds normal. No respiratory distress. She has no wheezes.  Abdominal: Soft. Bowel sounds are normal. There is no tenderness.  Musculoskeletal: She exhibits no edema or tenderness.  Lymphadenopathy:    She has no cervical adenopathy.  Neurological:  Decreased sensation to pin prick - bilateral feet extending up her legs.   Skin: No rash noted. No erythema.  Left third toe ulceration.    Psychiatric: She has a normal mood and affect. Her behavior is normal.    BP 128/70 mmHg  Pulse 70  Temp(Src) 98.2 F (36.8 C) (Oral)  Ht 5' 2.75" (1.594 m)  Wt 175 lb 2 oz (79.436 kg)  BMI 31.26 kg/m2  SpO2 96% Wt Readings from Last 3 Encounters:  02/12/15 175 lb 2 oz (79.436 kg)  01/08/14 163 lb (73.936 kg)     Lab Results  Component Value Date   WBC 6.3 03/01/2014   HGB 14.2 03/01/2014   HCT 43 03/01/2014   PLT 194 03/01/2014   CHOL 221* 12/01/2014   TRIG 146 12/01/2014   HDL 73* 12/01/2014   LDLCALC 119 12/01/2014   ALT 29 12/01/2014   AST 23 12/01/2014   NA 140 12/01/2014   K 4.5 12/01/2014   CREATININE 0.7 12/01/2014   BUN 13 12/01/2014   HGBA1C 9.1* 12/01/2014       Assessment & Plan:   Problem List Items Addressed This Visit    Anemia, iron deficiency    Sees Dr Sterling Big.  Gets iron infusions intermittently.  Last hgb wnl.  Has had colonoscopy - 2010.  EGD 2014.  Follow.        Atrial fibrillation    Sees Dr Ubaldo Glassing.  On digoxin and metoprolol.  Rate controlled.  Has a history of sick sinus syndrome.  Pacemaker in place.  Doing well.  Follow.       CAD (coronary artery disease)    Has documented CAD.  Per her report cath ok.  See if can obtain results.  Sees Dr Ubaldo Glassing.       Diabetes mellitus with neuropathy - Primary    Last a1c 9.0.  On amaryl and metformin.  Has decreased dose of metformin.  Start [place] 100mg  q day.  Follow sugars.  Monitor for lows.  Check metabolic panel and V6H.  Is up to date with eye exams.        Relevant  Medications   sitaGLIPtin (JANUVIA) 100 MG tablet   Diarrhea    Better since adjusting metformin dose.  Follow.  Pt reports norma bowel movements now.        Health care maintenance    Schedule her for a physical.   Schedule mammogram.  Colonoscopy 2010.  Pneumonia shot in 2014.  Need to  clarify type.       Hypercholesterolemia    Low cholesterol diet and exercise.  Follow lipid panel.       Macular degeneration    Followed by opthalmology.  Limited vision.  Does not drive.       Stress    Increased stress.  Husband recently diagnosed with bladder cancer.  Follow.        Other Visit Diagnoses    Breast cancer screening        Relevant Orders    MM DIGITAL SCREENING BILATERAL      I spent 45 minutes with the patient and more than 50% of the time was spent in consultation regarding the above.     [name], MD

## 2015-02-17 NOTE — Assessment & Plan Note (Signed)
Sees Dr Ubaldo Glassing.  On digoxin and metoprolol.  Rate controlled.  Has a history of sick sinus syndrome.  Pacemaker in place.  Doing well.  Follow.

## 2015-02-17 NOTE — Assessment & Plan Note (Signed)
Low cholesterol diet and exercise.  Follow lipid panel.   

## 2015-02-17 NOTE — Assessment & Plan Note (Addendum)
Schedule her for a physical.   Schedule mammogram.  Colonoscopy 2010.  Pneumonia shot in 2014.  Need to clarify type.

## 2015-02-17 NOTE — Assessment & Plan Note (Signed)
Has documented CAD.  Per her report cath ok.  See if can obtain results.  Sees Dr Ubaldo Glassing.

## 2015-02-17 NOTE — Assessment & Plan Note (Signed)
Better since adjusting metformin dose.  Follow.  Pt reports norma bowel movements now.

## 2015-02-25 ENCOUNTER — Ambulatory Visit (INDEPENDENT_AMBULATORY_CARE_PROVIDER_SITE_OTHER): Payer: Medicare Other | Admitting: Podiatry

## 2015-02-25 ENCOUNTER — Encounter: Payer: Self-pay | Admitting: Podiatry

## 2015-02-25 VITALS — BP 149/75 | HR 84 | Resp 16

## 2015-02-25 DIAGNOSIS — E11621 Type 2 diabetes mellitus with foot ulcer: Secondary | ICD-10-CM

## 2015-02-25 DIAGNOSIS — I872 Venous insufficiency (chronic) (peripheral): Secondary | ICD-10-CM

## 2015-02-25 DIAGNOSIS — L89891 Pressure ulcer of other site, stage 1: Secondary | ICD-10-CM

## 2015-02-25 DIAGNOSIS — L97529 Non-pressure chronic ulcer of other part of left foot with unspecified severity: Principal | ICD-10-CM

## 2015-02-25 DIAGNOSIS — I2510 Atherosclerotic heart disease of native coronary artery without angina pectoris: Secondary | ICD-10-CM

## 2015-02-25 NOTE — Progress Notes (Signed)
She presents today for follow-up of the ulceration third digit of her left foot. She states that I gets is doing okay seems to be doing better.  Objective: Vital signs are stable he is alert and oriented 3 third digit of the left foot demonstrates reactive fibrokeratoma to the distal tuft of the toe secondary to hammertoe deformity and skin breakdown. The skin breakdown and ulceration is limited to superficial tissue does not probe deep to bone and there appears to be good circulation. However the patient is complaining up And leg pain particularly in the evenings and swelling throughout the day. At this point I think a vascular evaluation consisting of venous Doppler as well as a arterial in flow study should be performed.  Assessment: Chronic ulceration third digit left foot. Appears to be slowly healing. Venous insufficiency resulting in edema to the bilateral lower extremity and nocturnal pain. She also has palpable pulses however concerned about digital flow. We are requesting a arterial study as well.  Plan: Debridement wound today and redressed with a dry sterile compressive dressing she'll follow up with vascular department and follow up with [place] in 1 month we did discuss the possible need for surgical intervention however this will be delayed until her husband is feeling better from his bladder cancer treatments

## 2015-02-26 ENCOUNTER — Telehealth: Payer: Self-pay | Admitting: *Deleted

## 2015-02-26 DIAGNOSIS — R197 Diarrhea, unspecified: Secondary | ICD-10-CM

## 2015-02-26 DIAGNOSIS — D509 Iron deficiency anemia, unspecified: Secondary | ICD-10-CM

## 2015-02-26 DIAGNOSIS — E1140 Type 2 diabetes mellitus with diabetic neuropathy, unspecified: Secondary | ICD-10-CM

## 2015-02-26 DIAGNOSIS — E7800 Pure hypercholesterolemia, unspecified: Secondary | ICD-10-CM

## 2015-02-26 DIAGNOSIS — I4891 Unspecified atrial fibrillation: Secondary | ICD-10-CM

## 2015-02-26 NOTE — Telephone Encounter (Signed)
Orders placed for f/u labs.  

## 2015-02-26 NOTE — Telephone Encounter (Signed)
Pt coming tomorrow what labs and dx?

## 2015-02-27 ENCOUNTER — Other Ambulatory Visit (INDEPENDENT_AMBULATORY_CARE_PROVIDER_SITE_OTHER): Payer: Medicare Other

## 2015-02-27 DIAGNOSIS — E1140 Type 2 diabetes mellitus with diabetic neuropathy, unspecified: Secondary | ICD-10-CM

## 2015-02-27 DIAGNOSIS — D509 Iron deficiency anemia, unspecified: Secondary | ICD-10-CM

## 2015-02-27 DIAGNOSIS — E7800 Pure hypercholesterolemia, unspecified: Secondary | ICD-10-CM

## 2015-02-27 DIAGNOSIS — I4891 Unspecified atrial fibrillation: Secondary | ICD-10-CM | POA: Diagnosis not present

## 2015-02-27 LAB — BASIC METABOLIC PANEL
BUN: 16 mg/dL (ref 6–23)
CO2: 26 mEq/L (ref 19–32)
Calcium: 10.1 mg/dL (ref 8.4–10.5)
Chloride: 103 mEq/L (ref 96–112)
Creatinine, Ser: 0.71 mg/dL (ref 0.40–1.20)
GFR: 83.5 mL/min (ref >60.00)
Glucose, Bld: 181 mg/dL — ABNORMAL HIGH (ref 70–99)
Potassium: 4.5 mEq/L (ref 3.5–5.1)
Sodium: 135 mEq/L (ref 135–145)

## 2015-02-27 LAB — MICROALBUMIN / CREATININE URINE RATIO
Creatinine,U: 71.3 mg/dL
Microalb Creat Ratio: 14.6 mg/g (ref 0.0–30.0)
Microalb, Ur: 10.4 mg/dL — ABNORMAL HIGH (ref 0.0–1.9)

## 2015-02-27 LAB — LIPID PANEL
Cholesterol: 189 mg/dL (ref 0–200)
HDL: 53.1 mg/dL (ref >39.00)
LDL Cholesterol: 107 mg/dL — ABNORMAL HIGH (ref 0–99)
NonHDL: 135.9
Total CHOL/HDL Ratio: 4
Triglycerides: 145 mg/dL (ref 0.0–149.0)
VLDL: 29 mg/dL (ref 0.0–40.0)

## 2015-02-27 LAB — CBC WITH DIFFERENTIAL/PLATELET
Basophils Absolute: 0 10*3/uL (ref 0.0–0.1)
Basophils Relative: 0.3 % (ref 0.0–3.0)
Eosinophils Absolute: 0.1 10*3/uL (ref 0.0–0.7)
Eosinophils Relative: 1.3 % (ref 0.0–5.0)
HCT: 38 % (ref 36.0–46.0)
Hemoglobin: 12.6 g/dL (ref 12.0–15.0)
Lymphocytes Relative: 27.1 % (ref 12.0–46.0)
Lymphs Abs: 1.7 10*3/uL (ref 0.7–4.0)
MCHC: 33.3 g/dL (ref 30.0–36.0)
MCV: 84.3 fl (ref 78.0–100.0)
Monocytes Absolute: 0.5 10*3/uL (ref 0.1–1.0)
Monocytes Relative: 7.9 % (ref 3.0–12.0)
Neutro Abs: 4 10*3/uL (ref 1.4–7.7)
Neutrophils Relative %: 63.4 % (ref 43.0–77.0)
Platelets: 176 10*3/uL (ref 150.0–400.0)
RBC: 4.51 Mil/uL (ref 3.87–5.11)
RDW: 15.3 % (ref 11.5–15.5)
WBC: 6.3 10*3/uL (ref 4.0–10.5)

## 2015-02-27 LAB — HEPATIC FUNCTION PANEL
ALT: 27 U/L (ref 0–35)
AST: 22 U/L (ref 0–37)
Albumin: 3.9 g/dL (ref 3.5–5.2)
Alkaline Phosphatase: 60 U/L (ref 39–117)
Bilirubin, Direct: 0.1 mg/dL (ref 0.0–0.3)
Total Bilirubin: 0.5 mg/dL (ref 0.2–1.2)
Total Protein: 7.3 g/dL (ref 6.0–8.3)

## 2015-02-27 LAB — HEMOGLOBIN A1C: Hgb A1c MFr Bld: 8.5 % — ABNORMAL HIGH (ref 4.6–6.5)

## 2015-02-27 LAB — TSH: TSH: 3.56 u[IU]/mL (ref 0.35–4.50)

## 2015-02-28 ENCOUNTER — Encounter: Payer: Self-pay | Admitting: Internal Medicine

## 2015-03-01 ENCOUNTER — Other Ambulatory Visit: Payer: Self-pay | Admitting: *Deleted

## 2015-03-01 ENCOUNTER — Encounter: Payer: Self-pay | Admitting: Internal Medicine

## 2015-03-01 MED ORDER — METFORMIN HCL 500 MG PO TABS: 500.0000 mg | ORAL_TABLET | Freq: Two times a day (BID) | ORAL | Status: DC

## 2015-03-01 NOTE — Telephone Encounter (Signed)
rx has been sent if for metformin 500mg  bid #180 with one refill.

## 2015-03-01 NOTE — Telephone Encounter (Signed)
Opened in error

## 2015-03-01 NOTE — Telephone Encounter (Signed)
Metformin refilled per patients mychart request. I called the pharmacy to verify the correct dose & directions. Pt notified via mychart.

## 2015-03-04 ENCOUNTER — Telehealth: Payer: Self-pay | Admitting: Internal Medicine

## 2015-03-04 NOTE — Telephone Encounter (Signed)
Will review when return to office.

## 2015-03-04 NOTE — Telephone Encounter (Signed)
Pt daughter dropped off blood sugar readings. Readings in Dr. Bary Leriche box/msn

## 2015-03-12 NOTE — Telephone Encounter (Signed)
My chart message sent regarding sugar readings.  Continue [place].

## 2015-03-15 DIAGNOSIS — H3532 Exudative age-related macular degeneration: Secondary | ICD-10-CM | POA: Diagnosis not present

## 2015-03-20 ENCOUNTER — Encounter: Payer: Self-pay | Admitting: Podiatry

## 2015-03-20 ENCOUNTER — Ambulatory Visit (INDEPENDENT_AMBULATORY_CARE_PROVIDER_SITE_OTHER): Payer: Medicare Other | Admitting: Podiatry

## 2015-03-20 VITALS — BP 148/78 | HR 86 | Resp 17

## 2015-03-20 DIAGNOSIS — L97529 Non-pressure chronic ulcer of other part of left foot with unspecified severity: Principal | ICD-10-CM

## 2015-03-20 DIAGNOSIS — M79676 Pain in unspecified toe(s): Secondary | ICD-10-CM

## 2015-03-20 DIAGNOSIS — L89891 Pressure ulcer of other site, stage 1: Secondary | ICD-10-CM | POA: Diagnosis not present

## 2015-03-20 DIAGNOSIS — B351 Tinea unguium: Secondary | ICD-10-CM | POA: Diagnosis not present

## 2015-03-20 DIAGNOSIS — E11621 Type 2 diabetes mellitus with foot ulcer: Secondary | ICD-10-CM

## 2015-03-20 NOTE — Progress Notes (Signed)
She presents today for follow-up of ulceration third digit left foot. She states this seems to be getting better. The redness is gone down and it doesn't hurt as much. She also like to have her elongated nails trimmed.  Objective: Vital signs are stable she is alert and oriented 3. Pulses are palpable bilateral. Ulceration distal aspect third digit does demonstrate reactive hyperkeratosis which was debrided demonstrates a 3 mm ulcerative lesion that probes deep and the tuft of the toe. I am concerned for osteomyelitis. Toenails are thick yellow dystrophic lytic mycotic and painful.  Assessment: Pain in limb secondary to onychomycosis bilateral. Ulceration can't Rule out osteomyelitis third digit left foot.  Plan: Discussed etiology pathology conservative versus surgical therapies. The next time she comes in which will be approximately one month we will discuss the need for surgical intervention debrided nails bilaterally. Redressed third toe with a dry sterile compressive dressing.

## 2015-03-21 DIAGNOSIS — I495 Sick sinus syndrome: Secondary | ICD-10-CM | POA: Diagnosis not present

## 2015-03-22 DIAGNOSIS — H3532 Exudative age-related macular degeneration: Secondary | ICD-10-CM | POA: Diagnosis not present

## 2015-03-28 DIAGNOSIS — C4491 Basal cell carcinoma of skin, unspecified: Secondary | ICD-10-CM | POA: Diagnosis not present

## 2015-04-03 ENCOUNTER — Ambulatory Visit: Payer: Medicare Other | Admitting: Podiatry

## 2015-04-04 DIAGNOSIS — R05 Cough: Secondary | ICD-10-CM | POA: Diagnosis not present

## 2015-04-04 DIAGNOSIS — I481 Persistent atrial fibrillation: Secondary | ICD-10-CM | POA: Diagnosis not present

## 2015-04-04 DIAGNOSIS — E782 Mixed hyperlipidemia: Secondary | ICD-10-CM | POA: Diagnosis not present

## 2015-04-04 DIAGNOSIS — R0602 Shortness of breath: Secondary | ICD-10-CM | POA: Diagnosis not present

## 2015-04-05 DIAGNOSIS — R05 Cough: Secondary | ICD-10-CM | POA: Diagnosis not present

## 2015-04-09 DIAGNOSIS — C44321 Squamous cell carcinoma of skin of nose: Secondary | ICD-10-CM | POA: Diagnosis not present

## 2015-04-09 DIAGNOSIS — D500 Iron deficiency anemia secondary to blood loss (chronic): Secondary | ICD-10-CM | POA: Diagnosis not present

## 2015-04-09 DIAGNOSIS — Z6830 Body mass index (BMI) 30.0-30.9, adult: Secondary | ICD-10-CM | POA: Diagnosis not present

## 2015-04-09 DIAGNOSIS — I509 Heart failure, unspecified: Secondary | ICD-10-CM | POA: Diagnosis not present

## 2015-04-12 DIAGNOSIS — R0602 Shortness of breath: Secondary | ICD-10-CM | POA: Diagnosis not present

## 2015-04-15 ENCOUNTER — Ambulatory Visit: Payer: Medicare Other | Admitting: Podiatry

## 2015-04-15 DIAGNOSIS — C44129 Squamous cell carcinoma of skin of left eyelid, including canthus: Secondary | ICD-10-CM | POA: Diagnosis not present

## 2015-04-17 ENCOUNTER — Ambulatory Visit (INDEPENDENT_AMBULATORY_CARE_PROVIDER_SITE_OTHER): Payer: Medicare Other

## 2015-04-17 ENCOUNTER — Encounter: Payer: Self-pay | Admitting: Podiatry

## 2015-04-17 ENCOUNTER — Ambulatory Visit: Payer: Medicare Other

## 2015-04-17 ENCOUNTER — Ambulatory Visit (INDEPENDENT_AMBULATORY_CARE_PROVIDER_SITE_OTHER): Payer: Medicare Other | Admitting: Podiatry

## 2015-04-17 VITALS — BP 137/76 | HR 89 | Resp 16

## 2015-04-17 DIAGNOSIS — I2510 Atherosclerotic heart disease of native coronary artery without angina pectoris: Secondary | ICD-10-CM | POA: Diagnosis not present

## 2015-04-17 DIAGNOSIS — L89891 Pressure ulcer of other site, stage 1: Secondary | ICD-10-CM | POA: Diagnosis not present

## 2015-04-17 DIAGNOSIS — E11621 Type 2 diabetes mellitus with foot ulcer: Secondary | ICD-10-CM | POA: Diagnosis not present

## 2015-04-17 DIAGNOSIS — L97529 Non-pressure chronic ulcer of other part of left foot with unspecified severity: Secondary | ICD-10-CM

## 2015-04-17 MED ORDER — DOXYCYCLINE HYCLATE 100 MG PO TABS: 100.0000 mg | ORAL_TABLET | Freq: Two times a day (BID) | ORAL | Status: DC

## 2015-04-17 NOTE — Progress Notes (Signed)
She presents today for follow-up of a chronic ulceration to the third digit of the left foot. She denies fever chills nausea vomiting muscle aches and pains. She does state that she is recently been short of breath and has had an echocardiogram performed and will follow-up with the cardiologist tomorrow and discuss her results.  Objective: Vital signs are stable she is alert and oriented 3. Pulses are palpable left foot. Hammertoe deformity which is resulted in a chronic ulceration has also resulted in osteomyelitis of the distal tuft of the distal phalanx third toe left foot. Chronic cellulitis is also present.  Assessment: Osteomyelitis third digit left foot.  Plan: We went over consent form today line by line number by number giving her ample time to ask questions she saw fit regarding partial amputation third digit left foot. I answered these questions to best of my ability and layman's terms. She understood it was amenable to it and signed all 3 pages of the consent form. I also wrote prescription for doxycycline to help hold her over prior surgery. I requested her daughter to follow up with [place] after her cardiologist visit tomorrow.

## 2015-04-18 DIAGNOSIS — I481 Persistent atrial fibrillation: Secondary | ICD-10-CM | POA: Diagnosis not present

## 2015-04-18 DIAGNOSIS — R600 Localized edema: Secondary | ICD-10-CM | POA: Diagnosis not present

## 2015-04-18 DIAGNOSIS — E119 Type 2 diabetes mellitus without complications: Secondary | ICD-10-CM | POA: Diagnosis not present

## 2015-04-18 DIAGNOSIS — E782 Mixed hyperlipidemia: Secondary | ICD-10-CM | POA: Diagnosis not present

## 2015-04-19 DIAGNOSIS — H3532 Exudative age-related macular degeneration: Secondary | ICD-10-CM | POA: Diagnosis not present

## 2015-04-22 ENCOUNTER — Telehealth: Payer: Self-pay | Admitting: *Deleted

## 2015-04-22 NOTE — Telephone Encounter (Signed)
I'm calling to set up your surgery.  Is this Friday, 04/26/2015 okay with you?  "That will be fine."  Okay, I'll get it scheduled then.  The surgical center will call you either tomorrow or Wednesday.  They will give you the arrival time.  You can't eat or drink anything after midnight.  Did they give you a pamphlet for the surgical center?  "Yes they did."  There's some information that needs to be filled out.  You can fill it out on-line if you have access to a computer or you can call and give the needed information.  "I don't have a computer.  So I'll call."

## 2015-04-23 ENCOUNTER — Telehealth: Payer: Self-pay | Admitting: *Deleted

## 2015-04-23 NOTE — Telephone Encounter (Signed)
"  I help my mother-in-law with appointments.  She saw [name] last Wednesday.  We're waiting on a call to schedule foot surgery in [place].  I know he does surgeries on Fridays.  Please give me a call back this morning.  I spoke with patient yesterday and scheduled surgery for 04/26/2015.

## 2015-04-24 DIAGNOSIS — H3532 Exudative age-related macular degeneration: Secondary | ICD-10-CM | POA: Diagnosis not present

## 2015-04-25 ENCOUNTER — Other Ambulatory Visit: Payer: Self-pay | Admitting: Podiatry

## 2015-04-25 MED ORDER — HYDROCODONE-ACETAMINOPHEN 10-325 MG PO TABS: 1.0000 | ORAL_TABLET | Freq: Four times a day (QID) | ORAL | Status: DC | PRN

## 2015-04-25 MED ORDER — CLINDAMYCIN HCL 150 MG PO CAPS: 150.0000 mg | ORAL_CAPSULE | Freq: Three times a day (TID) | ORAL | Status: DC

## 2015-04-26 ENCOUNTER — Encounter: Payer: Self-pay | Admitting: Podiatry

## 2015-04-26 DIAGNOSIS — M868X7 Other osteomyelitis, ankle and foot: Secondary | ICD-10-CM | POA: Diagnosis not present

## 2015-04-26 DIAGNOSIS — M150 Primary generalized (osteo)arthritis: Secondary | ICD-10-CM | POA: Diagnosis not present

## 2015-04-26 DIAGNOSIS — M86172 Other acute osteomyelitis, left ankle and foot: Secondary | ICD-10-CM | POA: Diagnosis not present

## 2015-04-26 DIAGNOSIS — I4891 Unspecified atrial fibrillation: Secondary | ICD-10-CM | POA: Diagnosis not present

## 2015-05-01 ENCOUNTER — Ambulatory Visit (INDEPENDENT_AMBULATORY_CARE_PROVIDER_SITE_OTHER): Payer: Medicare Other

## 2015-05-01 ENCOUNTER — Telehealth: Payer: Self-pay | Admitting: *Deleted

## 2015-05-01 ENCOUNTER — Ambulatory Visit (INDEPENDENT_AMBULATORY_CARE_PROVIDER_SITE_OTHER): Payer: Medicare Other | Admitting: Podiatry

## 2015-05-01 DIAGNOSIS — Z9889 Other specified postprocedural states: Secondary | ICD-10-CM | POA: Diagnosis not present

## 2015-05-01 DIAGNOSIS — L97524 Non-pressure chronic ulcer of other part of left foot with necrosis of bone: Secondary | ICD-10-CM | POA: Diagnosis not present

## 2015-05-01 NOTE — Telephone Encounter (Signed)
Courtesy call, pt picked up her line about the same time I saw she was at or returning from her 1st POV.  I asked pt if she had any questions or concerns after her 1st POV and she states she's doing great, I encouraged pt to call with concerns.

## 2015-05-01 NOTE — Progress Notes (Signed)
DOS 04/26/2015 Partial amputation toe 3rd left.

## 2015-05-01 NOTE — Progress Notes (Signed)
She presents today less than 1 week status post amputation third digit left foot. She denies fever chills nausea vomiting muscle aches and pains and states that she has not had any pain and she has not taking any medication for pain. She states that she tolerated the procedure well and had no problems postoperatively. She denies shortness of breath or chest pain.  Objective: Vital signs are stable she is alert and oriented 3. Dry sterile dressing was intact was removed demonstrates minimal edema no erythema saline as drainage or odor sutures are in place margins appear to be where well coapted third digit left foot. Radiographs taken in the office today do demonstrate a disarticulation at the PIPJ or digit left foot. No other osseous abnormalities are noted.  Assessment: Well-healing surgical foot status post 5 days.  Plan: Discussed etiology pathology conservative versus surgical therapies. And I will follow-up with her in 1 week for suture removal hopefully I did redress with a dry sterile compressive dressing today.

## 2015-05-02 ENCOUNTER — Encounter: Payer: Self-pay | Admitting: Podiatry

## 2015-05-08 ENCOUNTER — Ambulatory Visit (INDEPENDENT_AMBULATORY_CARE_PROVIDER_SITE_OTHER): Payer: Medicare Other | Admitting: Podiatry

## 2015-05-08 ENCOUNTER — Encounter: Payer: Self-pay | Admitting: Podiatry

## 2015-05-08 VITALS — BP 149/75 | HR 91 | Resp 18

## 2015-05-08 DIAGNOSIS — Z9889 Other specified postprocedural states: Secondary | ICD-10-CM

## 2015-05-08 NOTE — Progress Notes (Signed)
She presents today 3 weeks status post partial amputation third digit left foot secondary to osteomyelitis. She denies fever chills nausea vomiting muscle aches and pains states that the toe feels much better.  Objective: Vital signs are stable she is alert and oriented 3 sutures are intact to the distal aspect of the third digit left foot at the PIPJ. Margins are well coapted there is no signs of infection no purulence no erythema and no drainage.  Assessment: Well-healing surgical toe status post amputation third digit left foot.  Plan: Sutures were removed today margins remain well coapted I encouraged her to get back to her regular routine and I will follow-up with her in 2 weeks at which time another set of x-rays will be taken.

## 2015-05-11 ENCOUNTER — Other Ambulatory Visit: Payer: Self-pay | Admitting: Internal Medicine

## 2015-05-16 ENCOUNTER — Encounter: Payer: Medicare Other | Admitting: Internal Medicine

## 2015-05-17 DIAGNOSIS — C44129 Squamous cell carcinoma of skin of left eyelid, including canthus: Secondary | ICD-10-CM | POA: Diagnosis not present

## 2015-05-22 ENCOUNTER — Ambulatory Visit (INDEPENDENT_AMBULATORY_CARE_PROVIDER_SITE_OTHER): Payer: Medicare Other

## 2015-05-22 ENCOUNTER — Encounter: Payer: Self-pay | Admitting: Podiatry

## 2015-05-22 ENCOUNTER — Ambulatory Visit (INDEPENDENT_AMBULATORY_CARE_PROVIDER_SITE_OTHER): Payer: Medicare Other | Admitting: Podiatry

## 2015-05-22 VITALS — BP 146/76 | HR 85 | Resp 18

## 2015-05-22 DIAGNOSIS — L89891 Pressure ulcer of other site, stage 1: Secondary | ICD-10-CM

## 2015-05-22 DIAGNOSIS — M778 Other enthesopathies, not elsewhere classified: Secondary | ICD-10-CM

## 2015-05-22 DIAGNOSIS — M79676 Pain in unspecified toe(s): Secondary | ICD-10-CM | POA: Diagnosis not present

## 2015-05-22 DIAGNOSIS — B351 Tinea unguium: Secondary | ICD-10-CM | POA: Diagnosis not present

## 2015-05-22 DIAGNOSIS — L97524 Non-pressure chronic ulcer of other part of left foot with necrosis of bone: Secondary | ICD-10-CM

## 2015-05-22 DIAGNOSIS — M779 Enthesopathy, unspecified: Secondary | ICD-10-CM

## 2015-05-22 NOTE — Progress Notes (Signed)
She presents today with chief complaint of painful elongated toenails bilateral foot. She is also here for follow-up of a partial affectation third digit left foot which she states is doing just great.  Objective: Vital signs are stable she is alert and oriented 3. Pulses are strongly palpable. Nails are thick yellow dystrophic likely mycotic 1 through 5 right 1 into 4 and 5 on the left foot.  Assessment: Well-healing surgical toe third left. Pain in limb secondary to onychomycosis bilateral.  Plan: Debridement of nails 1 through 5 bilateral covered service secondary to pain.

## 2015-05-24 DIAGNOSIS — H3532 Exudative age-related macular degeneration: Secondary | ICD-10-CM | POA: Diagnosis not present

## 2015-06-04 DIAGNOSIS — D500 Iron deficiency anemia secondary to blood loss (chronic): Secondary | ICD-10-CM | POA: Diagnosis not present

## 2015-06-05 ENCOUNTER — Encounter: Payer: Self-pay | Admitting: Podiatry

## 2015-06-05 DIAGNOSIS — H3532 Exudative age-related macular degeneration: Secondary | ICD-10-CM | POA: Diagnosis not present

## 2015-06-07 ENCOUNTER — Other Ambulatory Visit: Payer: Self-pay | Admitting: Internal Medicine

## 2015-06-11 ENCOUNTER — Encounter: Payer: Self-pay | Admitting: Internal Medicine

## 2015-06-11 ENCOUNTER — Ambulatory Visit (INDEPENDENT_AMBULATORY_CARE_PROVIDER_SITE_OTHER): Payer: Medicare Other | Admitting: Internal Medicine

## 2015-06-11 VITALS — BP 120/70 | HR 81 | Temp 97.8°F | Resp 18 | Ht 62.5 in | Wt 171.2 lb

## 2015-06-11 DIAGNOSIS — F439 Reaction to severe stress, unspecified: Secondary | ICD-10-CM

## 2015-06-11 DIAGNOSIS — I4891 Unspecified atrial fibrillation: Secondary | ICD-10-CM

## 2015-06-11 DIAGNOSIS — L97529 Non-pressure chronic ulcer of other part of left foot with unspecified severity: Secondary | ICD-10-CM

## 2015-06-11 DIAGNOSIS — E1140 Type 2 diabetes mellitus with diabetic neuropathy, unspecified: Secondary | ICD-10-CM

## 2015-06-11 DIAGNOSIS — Z23 Encounter for immunization: Secondary | ICD-10-CM

## 2015-06-11 DIAGNOSIS — D509 Iron deficiency anemia, unspecified: Secondary | ICD-10-CM

## 2015-06-11 DIAGNOSIS — E7800 Pure hypercholesterolemia, unspecified: Secondary | ICD-10-CM

## 2015-06-11 DIAGNOSIS — H3530 Unspecified macular degeneration: Secondary | ICD-10-CM

## 2015-06-11 DIAGNOSIS — R197 Diarrhea, unspecified: Secondary | ICD-10-CM

## 2015-06-11 DIAGNOSIS — Z658 Other specified problems related to psychosocial circumstances: Secondary | ICD-10-CM

## 2015-06-11 DIAGNOSIS — Z0000 Encounter for general adult medical examination without abnormal findings: Secondary | ICD-10-CM

## 2015-06-11 DIAGNOSIS — I2510 Atherosclerotic heart disease of native coronary artery without angina pectoris: Secondary | ICD-10-CM

## 2015-06-11 MED ORDER — SITAGLIPTIN PHOSPHATE 100 MG PO TABS: 100.0000 mg | ORAL_TABLET | Freq: Every day | ORAL | Status: DC

## 2015-06-11 MED ORDER — METFORMIN HCL 500 MG PO TABS: 500.0000 mg | ORAL_TABLET | Freq: Two times a day (BID) | ORAL | Status: DC

## 2015-06-11 MED ORDER — FLUCONAZOLE 150 MG PO TABS: 150.0000 mg | ORAL_TABLET | Freq: Once | ORAL | Status: DC

## 2015-06-11 NOTE — Progress Notes (Signed)
Pre-visit discussion using our clinic review tool. No additional management support is needed unless otherwise documented below in the visit note.  

## 2015-06-11 NOTE — Progress Notes (Signed)
Patient ID: Holly Rose, female   DOB: [birth year], 81 y.o.   MRN: [id number]   Subjective:    Patient ID: Holly Rose, female    DOB: [birth year], 81 y.o.   MRN: [id number]  HPI  Patient with past history of atrial fib, CAD, diabetes and hypercholesterolemia who comes in today to follow up on these issues as well as for a complete physical exam.  She is accompanied by her daughter-n-law.  History obtained from both of them.  She has been doing relatively well.  Tries to stay active.  No cardiac symptoms with increased activity or exertion.  No increased heart rate or palpitations.  No acid reflux reported.  Stool had been doing better.  Noticed some loose stool over the last couple of days.  No increased diarrhea.  Eating and drinking.  No vomiting.  Being followed by podiatry for toe ulcer.     Past Medical History  Diagnosis Date  . Diabetes ([place])   . Atrial fibrillation ([place])   . Hyperlipidemia   . Anemia   . Neuropathy ([place])   . TIA (transient ischemic attack)   . Arthritis   . Cancer [facility])     H/O skin cancer  . Allergy   . Heart murmur   . Stroke ([place])   . CAD (coronary artery disease)   . History of sick sinus syndrome     s/p pacemaker placement  . Neuropathy ([place])   . Macular degeneration    Past Surgical History  Procedure Laterality Date  . Psychologist, forensic    . Breast surgery    . Appendectomy    . Abdominal hysterectomy    . Tonsillectomy and adenoidectomy    . Bladder surgery      1975 and 1995  . Rectocele repair  1996  . Cataract extraction  1997 and 1998  . Cardiac catheterization  1989   Family History  Problem Relation Age of Onset  . Stroke Mother   . Diabetes Mother   . Cancer Maternal Aunt     Breast Cancer  . Arthritis Maternal Grandmother   . Cancer Maternal Aunt     Lung Cancer   Social History   Social History  . Marital Status: Single    Spouse Name: N/A  . Number of Children: N/A  . Years of Education: N/A   Social History Main  Topics  . Smoking status: Never Smoker   . Smokeless tobacco: Never Used  . Alcohol Use: No  . Drug Use: No  . Sexual Activity: Not Asked   Other Topics Concern  . None   Social History Narrative    Outpatient Encounter Prescriptions as of 06/11/2015  Medication Sig  . aspirin 81 MG tablet Take 81 mg by mouth daily.  . digoxin (LANOXIN) 0.125 MG tablet   . furosemide (LASIX) 20 MG tablet Take 20 mg by mouth.  Marland Kitchen glimepiride (AMARYL) 4 MG tablet   . JANUVIA 100 MG tablet TAKE ONE TABLET BY MOUTH ONCE DAILY  . metFORMIN (GLUCOPHAGE) 500 MG tablet Take 1 tablet (500 mg total) by mouth 2 (two) times daily with a meal.  . metoprolol succinate (TOPROL-XL) 50 MG 24 hr tablet Take 50 mg by mouth daily. Take with or immediately following a meal.  . Multiple Vitamins-Minerals (MULTIVITAMIN PO) Take by mouth daily.  . Multiple Vitamins-Minerals (PRESERVISION AREDS 2 PO) Take by mouth.  . Probiotic Product (PROBIOTIC PO) Take by mouth daily.  . sitaGLIPtin (JANUVIA) 100  MG tablet Take 1 tablet (100 mg total) by mouth daily.  . vitamin B-12 (CYANOCOBALAMIN) 1000 MCG tablet Take 1,000 mcg by mouth daily.  . [DISCONTINUED] JANUVIA 100 MG tablet TAKE ONE TABLET BY MOUTH ONCE DAILY  . [DISCONTINUED] metFORMIN (GLUCOPHAGE) 500 MG tablet Take 1 tablet (500 mg total) by mouth 2 (two) times daily with a meal.  . fluconazole (DIFLUCAN) 150 MG tablet Take 1 tablet (150 mg total) by mouth once. If no improvement, then repeat x 1 in 3 days  . [DISCONTINUED] clindamycin (CLEOCIN) 150 MG capsule Take 1 capsule (150 mg total) by mouth 3 (three) times daily.  . [DISCONTINUED] doxycycline (VIBRA-TABS) 100 MG tablet Take 1 tablet (100 mg total) by mouth 2 (two) times daily.  . [DISCONTINUED] HYDROcodone-acetaminophen (NORCO) 10-325 MG per tablet Take 1 tablet by mouth every 6 (six) hours as needed.   No facility-administered encounter medications on file as of 06/11/2015.   Review of Systems  Constitutional:  Negative for appetite change and unexpected weight change.  HENT: Negative for congestion and sinus pressure.   Eyes: Negative for pain and visual disturbance.  Respiratory: Negative for cough, chest tightness and shortness of breath.   Cardiovascular: Negative for chest pain, palpitations and leg swelling.  Gastrointestinal: Negative for nausea, vomiting and abdominal pain.       Some loose stool.  Noticed over the last couple of days.    Genitourinary: Negative for dysuria and difficulty urinating.  Musculoskeletal: Negative for back pain and joint swelling.  Skin: Negative for color change and rash.  Neurological: Negative for dizziness, light-headedness and headaches.  Hematological: Negative for adenopathy. Does not bruise/bleed easily.  Psychiatric/Behavioral: Negative for dysphoric mood and agitation.       Objective:    Physical Exam  Constitutional: She is oriented to person, place, and time. She appears well-developed and well-nourished. No distress.  HENT:  Nose: Nose normal.  Mouth/Throat: Oropharynx is clear and moist.  Eyes: Right eye exhibits no discharge. Left eye exhibits no discharge. No scleral icterus.  Neck: Neck supple. No thyromegaly present.  Cardiovascular: Normal rate and regular rhythm.   Pulmonary/Chest: Breath sounds normal. No accessory muscle usage. No tachypnea. No respiratory distress. She has no decreased breath sounds. She has no wheezes. She has no rhonchi. Right breast exhibits no inverted nipple, no mass, no nipple discharge and no tenderness (no axillary adenopathy). Left breast exhibits no inverted nipple, no mass, no nipple discharge and no tenderness (no axilarry adenopathy).  Abdominal: Soft. Bowel sounds are normal. There is no tenderness.  Musculoskeletal: She exhibits no edema or tenderness.  Lymphadenopathy:    She has no cervical adenopathy.  Neurological: She is alert and oriented to person, place, and time.  Skin: Skin is warm. No rash  noted. No erythema.  Healing toe.    Psychiatric: She has a normal mood and affect. Her behavior is normal.    BP 120/70 mmHg  Pulse 81  Temp(Src) 97.8 F (36.6 C) (Oral)  Resp 18  Ht 5' 2.5" (1.588 m)  Wt 171 lb 4 oz (77.678 kg)  BMI 30.80 kg/m2  SpO2 97% Wt Readings from Last 3 Encounters:  06/11/15 171 lb 4 oz (77.678 kg)  02/12/15 175 lb 2 oz (79.436 kg)  01/08/14 163 lb (73.936 kg)     Lab Results  Component Value Date   WBC 6.3 02/27/2015   HGB 12.6 02/27/2015   HCT 38.0 02/27/2015   PLT 176.0 02/27/2015   GLUCOSE 181* 02/27/2015  CHOL 189 02/27/2015   TRIG 145.0 02/27/2015   HDL 53.10 02/27/2015   LDLCALC 107* 02/27/2015   ALT 27 02/27/2015   AST 22 02/27/2015   NA 135 02/27/2015   K 4.5 02/27/2015   CL 103 02/27/2015   CREATININE 0.71 02/27/2015   BUN 16 02/27/2015   CO2 26 02/27/2015   TSH 3.56 02/27/2015   HGBA1C 8.5* 02/27/2015   MICROALBUR 10.4* 02/27/2015       Assessment & Plan:   Problem List Items Addressed This Visit    Anemia, iron deficiency    Sees Dr Danton Clap ma.  Gets iron infusions intermittently.  Last hgb 13.5.  No infusion needed.  Follow cbc.  Colonoscopy 2010.  EGD - 2014.        Atrial fibrillation ([place])    Followed by Dr Ubaldo Glassing.  He has her on digoxin and metoprolol.  Has a history of sick sinus syndrome.  Pacemaker in place.  Stable.  Follow.        CAD (coronary artery disease)    Had documented CAD.  Followed by Dr Ubaldo Glassing.  No chest pain.  Currently doing well.        Diabetes mellitus with neuropathy ([place])    Most recent a1c improved - 8.5.  Low carb diet and exercise.  Follow met b and a1c.  On januvia and metformin.  Sugars improved and attached.  Follow.       Relevant Medications   metFORMIN (GLUCOPHAGE) 500 MG tablet   sitaGLIPtin (JANUVIA) 100 MG tablet   Other Relevant Orders   Hemoglobin T5V   Basic metabolic panel   Diarrhea    Diarrhea had been better.  Some soft stool over the last two days.  Discussed  probiotic.  Follow.  Notify me if persistent.       Health care maintenance    Physical today 06/11/15.  She desires not to have a mammogram.  Colonoscopy 2010.        Hypercholesterolemia    Low cholesterol diet and exercise.  Follow lipid panel.        Relevant Orders   Lipid panel   Hepatic function panel   Macular degeneration    Sees opthalmology.        Stress    Feels she is handling stress well.  Follow.       Toe ulcer ([place])    Followed by podiatry.         Other Visit Diagnoses    Encounter for immunization    -  Primary        [name], MD

## 2015-06-11 NOTE — Patient Instructions (Signed)

## 2015-06-16 ENCOUNTER — Encounter: Payer: Self-pay | Admitting: Internal Medicine

## 2015-06-16 NOTE — Assessment & Plan Note (Signed)
Followed by Dr Ubaldo Glassing.  He has her on digoxin and metoprolol.  Has a history of sick sinus syndrome.  Pacemaker in place.  Stable.  Follow.

## 2015-06-16 NOTE — Assessment & Plan Note (Signed)
Most recent a1c improved - 8.5.  Low carb diet and exercise.  Follow met b and a1c.  On januvia and metformin.  Sugars improved and attached.  Follow.

## 2015-06-16 NOTE — Assessment & Plan Note (Signed)
Physical today 06/11/15.  She desires not to have a mammogram.  Colonoscopy 2010.

## 2015-06-16 NOTE — Assessment & Plan Note (Signed)
Diarrhea had been better.  Some soft stool over the last two days.  Discussed probiotic.  Follow.  Notify me if persistent.

## 2015-06-16 NOTE — Assessment & Plan Note (Signed)
Sees opthalmology.  

## 2015-06-16 NOTE — Assessment & Plan Note (Signed)
Had documented CAD.  Followed by Dr Ubaldo Glassing.  No chest pain.  Currently doing well.

## 2015-06-16 NOTE — Assessment & Plan Note (Signed)
Followed by podiatry 

## 2015-06-16 NOTE — Assessment & Plan Note (Signed)
Feels she is handling stress well.  Follow.   

## 2015-06-16 NOTE — Assessment & Plan Note (Signed)
Sees Dr Danton Clap ma.  Gets iron infusions intermittently.  Last hgb 13.5.  No infusion needed.  Follow cbc.  Colonoscopy 2010.  EGD - 2014.

## 2015-06-16 NOTE — Assessment & Plan Note (Signed)
Low cholesterol diet and exercise.  Follow lipid panel.   

## 2015-06-21 ENCOUNTER — Other Ambulatory Visit (INDEPENDENT_AMBULATORY_CARE_PROVIDER_SITE_OTHER): Payer: Medicare Other

## 2015-06-21 DIAGNOSIS — E1140 Type 2 diabetes mellitus with diabetic neuropathy, unspecified: Secondary | ICD-10-CM

## 2015-06-21 DIAGNOSIS — E7800 Pure hypercholesterolemia, unspecified: Secondary | ICD-10-CM

## 2015-06-21 LAB — BASIC METABOLIC PANEL
BUN: 14 mg/dL (ref 6–23)
CO2: 23 mEq/L (ref 19–32)
Calcium: 10.7 mg/dL — ABNORMAL HIGH (ref 8.4–10.5)
Chloride: 105 mEq/L (ref 96–112)
Creatinine, Ser: 0.71 mg/dL (ref 0.40–1.20)
GFR: 83.44 mL/min (ref >60.00)
Glucose, Bld: 173 mg/dL — ABNORMAL HIGH (ref 70–99)
Potassium: 4.5 mEq/L (ref 3.5–5.1)
Sodium: 137 mEq/L (ref 135–145)

## 2015-06-21 LAB — LIPID PANEL
Cholesterol: 202 mg/dL — ABNORMAL HIGH (ref 0–200)
HDL: 49.8 mg/dL (ref >39.00)
LDL Cholesterol: 127 mg/dL — ABNORMAL HIGH (ref 0–99)
NonHDL: 152.11
Total CHOL/HDL Ratio: 4
Triglycerides: 125 mg/dL (ref 0.0–149.0)
VLDL: 25 mg/dL (ref 0.0–40.0)

## 2015-06-21 LAB — HEPATIC FUNCTION PANEL
ALT: 22 U/L (ref 0–35)
AST: 24 U/L (ref 0–37)
Albumin: 3.7 g/dL (ref 3.5–5.2)
Alkaline Phosphatase: 50 U/L (ref 39–117)
Bilirubin, Direct: 0.1 mg/dL (ref 0.0–0.3)
Total Bilirubin: 0.5 mg/dL (ref 0.2–1.2)
Total Protein: 7.7 g/dL (ref 6.0–8.3)

## 2015-06-21 LAB — HEMOGLOBIN A1C: Hgb A1c MFr Bld: 8 % — ABNORMAL HIGH (ref 4.6–6.5)

## 2015-06-22 ENCOUNTER — Other Ambulatory Visit: Payer: Self-pay | Admitting: Internal Medicine

## 2015-06-22 NOTE — Progress Notes (Signed)
Order placed for f/u calcium.  

## 2015-06-24 ENCOUNTER — Encounter: Payer: Self-pay | Admitting: *Deleted

## 2015-07-04 DIAGNOSIS — H353221 Exudative age-related macular degeneration, left eye, with active choroidal neovascularization: Secondary | ICD-10-CM | POA: Diagnosis not present

## 2015-07-10 ENCOUNTER — Other Ambulatory Visit (INDEPENDENT_AMBULATORY_CARE_PROVIDER_SITE_OTHER): Payer: Medicare Other

## 2015-07-10 LAB — CALCIUM: Calcium: 10.4 mg/dL (ref 8.4–10.5)

## 2015-07-11 ENCOUNTER — Encounter: Payer: Self-pay | Admitting: Internal Medicine

## 2015-07-15 DIAGNOSIS — H353221 Exudative age-related macular degeneration, left eye, with active choroidal neovascularization: Secondary | ICD-10-CM | POA: Diagnosis not present

## 2015-07-19 ENCOUNTER — Telehealth: Payer: Self-pay | Admitting: Internal Medicine

## 2015-07-19 NOTE — Telephone Encounter (Signed)
Pt daughter called about (mom) pt needing a new glucose meter and test strips. Daughter states the meter stopped working. Thank yOu!

## 2015-07-20 ENCOUNTER — Encounter: Payer: Self-pay | Admitting: Internal Medicine

## 2015-07-22 ENCOUNTER — Other Ambulatory Visit: Payer: Self-pay

## 2015-07-22 MED ORDER — BLOOD GLUCOSE METER KIT: PACK | Status: DC

## 2015-07-22 MED ORDER — GLUCOSE BLOOD VI STRP: ORAL_STRIP | Status: DC

## 2015-07-22 NOTE — Telephone Encounter (Signed)
Spoke with patient, she has a [employer] meter currently and would like to have another like it.  Sent new prescription to the pharmacy for the meter and test strips.

## 2015-07-25 ENCOUNTER — Other Ambulatory Visit: Payer: Self-pay

## 2015-07-25 MED ORDER — GLUCOSE BLOOD VI STRP: ORAL_STRIP | Status: DC

## 2015-07-25 MED ORDER — BLOOD GLUCOSE METER KIT: PACK | Status: DC

## 2015-08-06 ENCOUNTER — Encounter: Payer: Self-pay | Admitting: Internal Medicine

## 2015-08-07 NOTE — Telephone Encounter (Signed)
I can see her in the open spot on Tuesday - that we discussed.  Please contact the daughter and get her scheduled.  Thank you.  Please reiterate to her that if any change or worsening issues, she needs to be evaluated.  Thanks.   Dr [name]

## 2015-08-07 NOTE — Telephone Encounter (Signed)
Please call [name] (pts daughter-n-law) and let her know that if Ms Sipe is continuing to have bloody stools, she needs to be evaluated today.  Since I am not here this pm or the rest of the week, I recommend [facility] or [facility] for evaluation.  That way can get labs while there if needed.  If having increased bleeding and pain, recommend to ER.  Let me know if any problems.  Thanks.    Dr [name]

## 2015-08-13 ENCOUNTER — Encounter: Payer: Self-pay | Admitting: Internal Medicine

## 2015-08-13 ENCOUNTER — Ambulatory Visit (INDEPENDENT_AMBULATORY_CARE_PROVIDER_SITE_OTHER): Payer: Medicare Other | Admitting: Internal Medicine

## 2015-08-13 VITALS — BP 128/78 | HR 87 | Temp 97.9°F | Resp 18 | Ht 62.5 in | Wt 174.0 lb

## 2015-08-13 DIAGNOSIS — R1084 Generalized abdominal pain: Secondary | ICD-10-CM | POA: Diagnosis not present

## 2015-08-13 DIAGNOSIS — K625 Hemorrhage of anus and rectum: Secondary | ICD-10-CM

## 2015-08-13 DIAGNOSIS — I2510 Atherosclerotic heart disease of native coronary artery without angina pectoris: Secondary | ICD-10-CM | POA: Diagnosis not present

## 2015-08-13 DIAGNOSIS — E1140 Type 2 diabetes mellitus with diabetic neuropathy, unspecified: Secondary | ICD-10-CM

## 2015-08-13 DIAGNOSIS — D509 Iron deficiency anemia, unspecified: Secondary | ICD-10-CM | POA: Diagnosis not present

## 2015-08-13 LAB — HEPATIC FUNCTION PANEL
ALT: 22 U/L (ref 0–35)
AST: 22 U/L (ref 0–37)
Albumin: 3.8 g/dL (ref 3.5–5.2)
Alkaline Phosphatase: 49 U/L (ref 39–117)
Bilirubin, Direct: 0.1 mg/dL (ref 0.0–0.3)
Total Bilirubin: 0.4 mg/dL (ref 0.2–1.2)
Total Protein: 7.6 g/dL (ref 6.0–8.3)

## 2015-08-13 LAB — CBC WITH DIFFERENTIAL/PLATELET
Basophils Absolute: 0 K/uL (ref 0.0–0.1)
Basophils Relative: 0.5 % (ref 0.0–3.0)
Eosinophils Absolute: 0.1 K/uL (ref 0.0–0.7)
Eosinophils Relative: 1.8 % (ref 0.0–5.0)
HCT: 39.7 % (ref 36.0–46.0)
Hemoglobin: 13 g/dL (ref 12.0–15.0)
Lymphocytes Relative: 30.2 % (ref 12.0–46.0)
Lymphs Abs: 1.9 K/uL (ref 0.7–4.0)
MCHC: 32.7 g/dL (ref 30.0–36.0)
MCV: 88 fl (ref 78.0–100.0)
Monocytes Absolute: 0.6 K/uL (ref 0.1–1.0)
Monocytes Relative: 9.5 % (ref 3.0–12.0)
Neutro Abs: 3.6 K/uL (ref 1.4–7.7)
Neutrophils Relative %: 58 % (ref 43.0–77.0)
Platelets: 179 K/uL (ref 150.0–400.0)
RBC: 4.51 Mil/uL (ref 3.87–5.11)
RDW: 15.4 % (ref 11.5–15.5)
WBC: 6.2 K/uL (ref 4.0–10.5)

## 2015-08-13 LAB — BASIC METABOLIC PANEL WITH GFR
BUN: 18 mg/dL (ref 6–23)
CO2: 27 meq/L (ref 19–32)
Calcium: 10.6 mg/dL — ABNORMAL HIGH (ref 8.4–10.5)
Chloride: 101 meq/L (ref 96–112)
Creatinine, Ser: 0.72 mg/dL (ref 0.40–1.20)
GFR: 82.07 mL/min
Glucose, Bld: 194 mg/dL — ABNORMAL HIGH (ref 70–99)
Potassium: 4.4 meq/L (ref 3.5–5.1)
Sodium: 136 meq/L (ref 135–145)

## 2015-08-13 NOTE — Progress Notes (Signed)
Pre-visit discussion using our clinic review tool. No additional management support is needed unless otherwise documented below in the visit note.  

## 2015-08-13 NOTE — Progress Notes (Signed)
Patient ID: Ina Kick, female   DOB: December 19, 1929, 81 y.o.   MRN: [id number]   Subjective:    Patient ID: Ina Kick, female    DOB: 03-21-1930, 81 y.o.   MRN: [id number]  HPI  Patient with past history of diabetes, afib, CAD, anemia and hypercholesterolemia.  She comes in today as a work in with concerns regarding noticing blood in her stool.  She is accompanied by her daughter-n-law.  History obtained from both of them.  She reports that the Tuesday before Thanksgiving, she wiped and noticed BRB on toilet paper.  The bleeding continued for a few days.  Noticed bleeding with bowel movements.  Was passing some clots.  No vaginal bleeding and no hematuria.  The bleeding stopped several days ago.  Initially had right lower side pain.  This change to left side pain.  No pain today - per her report.  Previously had some nausea.  One episode of emesis.  Feels better today.  Has been eating well.     Past Medical History  Diagnosis Date  . Diabetes ([place])   . Atrial fibrillation ([place])   . Hyperlipidemia   . Anemia   . Neuropathy ([place])   . TIA (transient ischemic attack)   . Arthritis   . Allergy   . Heart murmur   . Stroke ([place])   . CAD (coronary artery disease)   . History of sick sinus syndrome     s/p pacemaker placement  . Neuropathy ([place])   . Macular degeneration   . Cancer [facility])     H/O skin cancer, squamous   Past Surgical History  Procedure Laterality Date  . Psychologist, forensic    . Breast surgery    . Appendectomy    . Abdominal hysterectomy    . Tonsillectomy and adenoidectomy    . Bladder surgery      1975 and 1995  . Rectocele repair  1996  . Cataract extraction  1997 and 1998  . Cardiac catheterization  1989   Family History  Problem Relation Age of Onset  . Stroke Mother   . Diabetes Mother   . Cancer Maternal Aunt     Breast Cancer  . Arthritis Maternal Grandmother   . Cancer Maternal Aunt     Lung Cancer   Social History   Social History  . Marital  Status: Single    Spouse Name: N/A  . Number of Children: N/A  . Years of Education: N/A   Social History Main Topics  . Smoking status: Never Smoker   . Smokeless tobacco: Never Used  . Alcohol Use: No  . Drug Use: No  . Sexual Activity: Not Asked   Other Topics Concern  . None   Social History Narrative    Outpatient Encounter Prescriptions as of 08/13/2015  Medication Sig  . aspirin 81 MG tablet Take 81 mg by mouth daily.  . blood glucose meter kit and supplies Per patient please dispense One touch Ultra2 meter.Use meter and supplies three times a day to check blood sugar. ICD10: E11.40  . digoxin (LANOXIN) 0.125 MG tablet   . fluconazole (DIFLUCAN) 150 MG tablet Take 1 tablet (150 mg total) by mouth once. If no improvement, then repeat x 1 in 3 days  . furosemide (LASIX) 20 MG tablet Take 20 mg by mouth.  Marland Kitchen glimepiride (AMARYL) 4 MG tablet   . glucose blood test strip Use three times a day to check blood sugar. Please dispense One  Touch Ultra 2 strips to go with meter. ICD-10:E11.40  . JANUVIA 100 MG tablet TAKE ONE TABLET BY MOUTH ONCE DAILY  . metFORMIN (GLUCOPHAGE) 500 MG tablet Take 1 tablet (500 mg total) by mouth 2 (two) times daily with a meal.  . metoprolol succinate (TOPROL-XL) 50 MG 24 hr tablet Take 50 mg by mouth daily. Take with or immediately following a meal.  . Multiple Vitamins-Minerals (MULTIVITAMIN PO) Take by mouth daily.  . Multiple Vitamins-Minerals (PRESERVISION AREDS 2 PO) Take by mouth.  . Probiotic Product (PROBIOTIC PO) Take by mouth daily.  . sitaGLIPtin (JANUVIA) 100 MG tablet Take 1 tablet (100 mg total) by mouth daily.  . vitamin B-12 (CYANOCOBALAMIN) 1000 MCG tablet Take 1,000 mcg by mouth daily.   No facility-administered encounter medications on file as of 08/13/2015.    Review of Systems  Constitutional: Negative for appetite change and unexpected weight change.  HENT: Negative for congestion and sinus pressure.   Respiratory: Negative  for cough, chest tightness and shortness of breath.   Cardiovascular: Negative for chest pain, palpitations and leg swelling.  Gastrointestinal: Positive for abdominal pain and blood in stool.       Some occasional nausea.  One episode of vomiting.    Genitourinary: Negative for dysuria.       No hematuria.   Musculoskeletal: Negative for back pain and joint swelling.  Skin: Negative for color change and rash.  Neurological: Negative for dizziness, light-headedness and headaches.       Objective:     Blood pressure rechecked by me:  130/72  Physical Exam  Constitutional: She appears well-developed and well-nourished. No distress.  HENT:  Mouth/Throat: Oropharynx is clear and moist.  Neck: Neck supple.  Cardiovascular: Normal rate and regular rhythm.   Pulmonary/Chest: Breath sounds normal. No respiratory distress. She has no wheezes.  Abdominal: Soft. Bowel sounds are normal.  Increased tenderness to palpation over her mid abdomen and left side.    Genitourinary:  Rectal exam - heme negative.    Musculoskeletal: She exhibits no edema or tenderness.  Lymphadenopathy:    She has no cervical adenopathy.  Skin: No rash noted. No erythema.  Psychiatric: She has a normal mood and affect. Her behavior is normal.    BP 128/78 mmHg  Pulse 87  Temp(Src) 97.9 F (36.6 C) (Oral)  Resp 18  Ht 5' 2.5" (1.588 m)  Wt 174 lb (78.926 kg)  BMI 31.30 kg/m2  SpO2 96% Wt Readings from Last 3 Encounters:  08/13/15 174 lb (78.926 kg)  06/11/15 171 lb 4 oz (77.678 kg)  02/12/15 175 lb 2 oz (79.436 kg)     Lab Results  Component Value Date   WBC 6.2 08/13/2015   HGB 13.0 08/13/2015   HCT 39.7 08/13/2015   PLT 179.0 08/13/2015   GLUCOSE 194* 08/13/2015   CHOL 202* 06/21/2015   TRIG 125.0 06/21/2015   HDL 49.80 06/21/2015   LDLCALC 127* 06/21/2015   ALT 22 08/13/2015   AST 22 08/13/2015   NA 136 08/13/2015   K 4.4 08/13/2015   CL 101 08/13/2015   CREATININE 0.72 08/13/2015    BUN 18 08/13/2015   CO2 27 08/13/2015   TSH 3.56 02/27/2015   HGBA1C 8.0* 06/21/2015   MICROALBUR 10.4* 02/27/2015       Assessment & Plan:   Problem List Items Addressed This Visit    Abdominal pain - Primary    Increased abdominal pain on exam.  Pt reports feeling better.  Bleeding  has subsided.  Given the increased pain on exam, will obtain CT abdomen and pelvis.  Also, refer to GI given the amount of bleeding.  Hold abx.  Check cbc and metabolic panel.        Relevant Orders   Basic metabolic panel (Completed)   Hepatic function panel (Completed)   CT Abdomen Pelvis W Contrast (Completed)   Ambulatory referral to Gastroenterology   Anemia, iron deficiency    Followed by hematology - Dr Sterling Big.  Check cbc today given bleeding.  Last colonoscopy 2010.        Diabetes mellitus with neuropathy ([facility])    States sugars are relatively stable.  Eating.  Follow sugars.        Rectal bleeding    Bleeding has stopped.  With increased pain.  Obtain CT as outlined.  Instructed on stopping metformin around CT.  Refer to GI.  Check cbc.       Relevant Orders   CBC with Differential/Platelet (Completed)   CT Abdomen Pelvis W Contrast (Completed)   Ambulatory referral to Gastroenterology       [name], MD

## 2015-08-14 ENCOUNTER — Encounter: Payer: Self-pay | Admitting: Internal Medicine

## 2015-08-15 ENCOUNTER — Ambulatory Visit
Admission: RE | Admit: 2015-08-15 | Discharge: 2015-08-15 | Disposition: A | Discharge status: Home / Self Care | Payer: Medicare Other | Source: Ambulatory Visit | Attending: Internal Medicine | Admitting: Internal Medicine

## 2015-08-15 DIAGNOSIS — K625 Hemorrhage of anus and rectum: Secondary | ICD-10-CM | POA: Diagnosis not present

## 2015-08-15 DIAGNOSIS — R1084 Generalized abdominal pain: Secondary | ICD-10-CM | POA: Diagnosis not present

## 2015-08-15 DIAGNOSIS — R1031 Right lower quadrant pain: Secondary | ICD-10-CM | POA: Diagnosis not present

## 2015-08-15 DIAGNOSIS — N281 Cyst of kidney, acquired: Secondary | ICD-10-CM | POA: Diagnosis not present

## 2015-08-15 DIAGNOSIS — N838 Other noninflammatory disorders of ovary, fallopian tube and broad ligament: Secondary | ICD-10-CM | POA: Diagnosis not present

## 2015-08-15 DIAGNOSIS — R1032 Left lower quadrant pain: Secondary | ICD-10-CM | POA: Diagnosis not present

## 2015-08-15 DIAGNOSIS — K5730 Diverticulosis of large intestine without perforation or abscess without bleeding: Secondary | ICD-10-CM | POA: Diagnosis not present

## 2015-08-15 MED ORDER — IOHEXOL 300 MG/ML  SOLN
50.0000 mL | Freq: Once | INTRAMUSCULAR | Status: AC | PRN
  Administered 2015-08-15: 100 mL via INTRAVENOUS

## 2015-08-18 ENCOUNTER — Encounter: Payer: Self-pay | Admitting: Internal Medicine

## 2015-08-18 DIAGNOSIS — R109 Unspecified abdominal pain: Secondary | ICD-10-CM | POA: Insufficient documentation

## 2015-08-18 DIAGNOSIS — K625 Hemorrhage of anus and rectum: Secondary | ICD-10-CM | POA: Insufficient documentation

## 2015-08-18 NOTE — Assessment & Plan Note (Signed)
States sugars are relatively stable.  Eating.  Follow sugars.

## 2015-08-18 NOTE — Assessment & Plan Note (Signed)
Increased abdominal pain on exam.  Pt reports feeling better.  Bleeding has subsided.  Given the increased pain on exam, will obtain CT abdomen and pelvis.  Also, refer to GI given the amount of bleeding.  Hold abx.  Check cbc and metabolic panel.

## 2015-08-18 NOTE — Assessment & Plan Note (Signed)
Bleeding has stopped.  With increased pain.  Obtain CT as outlined.  Instructed on stopping metformin around CT.  Refer to GI.  Check cbc.

## 2015-08-18 NOTE — Assessment & Plan Note (Addendum)
Followed by hematology - Dr Sterling Big.  Check cbc today given bleeding.  Last colonoscopy 2010.

## 2015-08-19 ENCOUNTER — Encounter: Payer: Self-pay | Admitting: Internal Medicine

## 2015-08-19 ENCOUNTER — Ambulatory Visit: Payer: BLUE CROSS/BLUE SHIELD

## 2015-08-20 ENCOUNTER — Encounter: Payer: Self-pay | Admitting: Internal Medicine

## 2015-08-20 ENCOUNTER — Other Ambulatory Visit: Payer: Self-pay | Admitting: Internal Medicine

## 2015-08-20 DIAGNOSIS — R935 Abnormal findings on diagnostic imaging of other abdominal regions, including retroperitoneum: Secondary | ICD-10-CM

## 2015-08-20 DIAGNOSIS — N949 Unspecified condition associated with female genital organs and menstrual cycle: Secondary | ICD-10-CM

## 2015-08-20 NOTE — Progress Notes (Signed)
Order placed for pelvic ultrasound.

## 2015-08-21 ENCOUNTER — Ambulatory Visit (INDEPENDENT_AMBULATORY_CARE_PROVIDER_SITE_OTHER): Payer: Medicare Other | Admitting: Podiatry

## 2015-08-21 ENCOUNTER — Ambulatory Visit: Payer: Medicare Other | Admitting: Podiatry

## 2015-08-21 ENCOUNTER — Encounter: Payer: Self-pay | Admitting: Podiatry

## 2015-08-21 VITALS — BP 141/77 | HR 81 | Resp 18

## 2015-08-21 DIAGNOSIS — M79676 Pain in unspecified toe(s): Secondary | ICD-10-CM | POA: Diagnosis not present

## 2015-08-21 DIAGNOSIS — B351 Tinea unguium: Secondary | ICD-10-CM | POA: Diagnosis not present

## 2015-08-21 NOTE — Progress Notes (Signed)
She presents today with chief complaint of painful elongated toenails bilateral.  Objective: Vital signs are stable she is alert and oriented 3. Pulses are strongly palpable. Her toenails are thick yellow dystrophic with mycotic bilateral.  Assessment: Pain in limb secondary onychomycosis bilateral.  Plan: Debridement of nails bilaterally.

## 2015-08-26 DIAGNOSIS — H353221 Exudative age-related macular degeneration, left eye, with active choroidal neovascularization: Secondary | ICD-10-CM | POA: Diagnosis not present

## 2015-08-28 ENCOUNTER — Ambulatory Visit
Admission: RE | Admit: 2015-08-28 | Discharge: 2015-08-28 | Disposition: A | Discharge status: Home / Self Care | Payer: Medicare Other | Source: Ambulatory Visit | Attending: Internal Medicine | Admitting: Internal Medicine

## 2015-08-28 ENCOUNTER — Encounter: Payer: Self-pay | Admitting: Internal Medicine

## 2015-08-28 DIAGNOSIS — R935 Abnormal findings on diagnostic imaging of other abdominal regions, including retroperitoneum: Secondary | ICD-10-CM

## 2015-08-28 DIAGNOSIS — N949 Unspecified condition associated with female genital organs and menstrual cycle: Secondary | ICD-10-CM | POA: Diagnosis not present

## 2015-08-28 DIAGNOSIS — Z90710 Acquired absence of both cervix and uterus: Secondary | ICD-10-CM | POA: Diagnosis not present

## 2015-08-28 DIAGNOSIS — N838 Other noninflammatory disorders of ovary, fallopian tube and broad ligament: Secondary | ICD-10-CM | POA: Diagnosis not present

## 2015-08-29 ENCOUNTER — Encounter: Payer: Self-pay | Admitting: *Deleted

## 2015-09-11 ENCOUNTER — Encounter: Payer: Self-pay | Admitting: *Deleted

## 2015-09-11 ENCOUNTER — Encounter: Payer: Self-pay | Admitting: Internal Medicine

## 2015-09-12 ENCOUNTER — Encounter: Payer: Self-pay | Admitting: Anesthesiology

## 2015-09-12 ENCOUNTER — Ambulatory Visit: Payer: Medicare Other | Admitting: Anesthesiology

## 2015-09-12 ENCOUNTER — Encounter
Admission: RE | Disposition: A | Discharge status: Home / Self Care | Payer: Self-pay | Source: Ambulatory Visit | Attending: Unknown Physician Specialty

## 2015-09-12 ENCOUNTER — Ambulatory Visit
Admission: RE | Admit: 2015-09-12 | Discharge: 2015-09-12 | Disposition: A | Discharge status: Home / Self Care | Payer: Medicare Other | Source: Ambulatory Visit | Attending: Unknown Physician Specialty | Admitting: Unknown Physician Specialty

## 2015-09-12 DIAGNOSIS — E785 Hyperlipidemia, unspecified: Secondary | ICD-10-CM | POA: Diagnosis not present

## 2015-09-12 DIAGNOSIS — I2510 Atherosclerotic heart disease of native coronary artery without angina pectoris: Secondary | ICD-10-CM | POA: Diagnosis not present

## 2015-09-12 DIAGNOSIS — Z7982 Long term (current) use of aspirin: Secondary | ICD-10-CM | POA: Insufficient documentation

## 2015-09-12 DIAGNOSIS — K648 Other hemorrhoids: Secondary | ICD-10-CM | POA: Diagnosis not present

## 2015-09-12 DIAGNOSIS — Z85828 Personal history of other malignant neoplasm of skin: Secondary | ICD-10-CM | POA: Diagnosis not present

## 2015-09-12 DIAGNOSIS — Z950 Presence of cardiac pacemaker: Secondary | ICD-10-CM | POA: Diagnosis not present

## 2015-09-12 DIAGNOSIS — Z79899 Other long term (current) drug therapy: Secondary | ICD-10-CM | POA: Diagnosis not present

## 2015-09-12 DIAGNOSIS — Z8673 Personal history of transient ischemic attack (TIA), and cerebral infarction without residual deficits: Secondary | ICD-10-CM | POA: Insufficient documentation

## 2015-09-12 DIAGNOSIS — K5730 Diverticulosis of large intestine without perforation or abscess without bleeding: Secondary | ICD-10-CM | POA: Insufficient documentation

## 2015-09-12 DIAGNOSIS — K625 Hemorrhage of anus and rectum: Secondary | ICD-10-CM | POA: Insufficient documentation

## 2015-09-12 DIAGNOSIS — E119 Type 2 diabetes mellitus without complications: Secondary | ICD-10-CM | POA: Insufficient documentation

## 2015-09-12 DIAGNOSIS — D122 Benign neoplasm of ascending colon: Secondary | ICD-10-CM | POA: Insufficient documentation

## 2015-09-12 DIAGNOSIS — D120 Benign neoplasm of cecum: Secondary | ICD-10-CM | POA: Diagnosis not present

## 2015-09-12 DIAGNOSIS — I4891 Unspecified atrial fibrillation: Secondary | ICD-10-CM | POA: Insufficient documentation

## 2015-09-12 DIAGNOSIS — K640 First degree hemorrhoids: Secondary | ICD-10-CM | POA: Insufficient documentation

## 2015-09-12 DIAGNOSIS — D123 Benign neoplasm of transverse colon: Secondary | ICD-10-CM | POA: Diagnosis not present

## 2015-09-12 DIAGNOSIS — K5790 Diverticulosis of intestine, part unspecified, without perforation or abscess without bleeding: Secondary | ICD-10-CM | POA: Diagnosis not present

## 2015-09-12 DIAGNOSIS — K635 Polyp of colon: Secondary | ICD-10-CM | POA: Diagnosis not present

## 2015-09-12 DIAGNOSIS — Z7984 Long term (current) use of oral hypoglycemic drugs: Secondary | ICD-10-CM | POA: Diagnosis not present

## 2015-09-12 DIAGNOSIS — I770 Arteriovenous fistula, acquired: Secondary | ICD-10-CM | POA: Diagnosis not present

## 2015-09-12 DIAGNOSIS — K5520 Angiodysplasia of colon without hemorrhage: Secondary | ICD-10-CM | POA: Diagnosis not present

## 2015-09-12 HISTORY — PX: COLONOSCOPY WITH PROPOFOL: SHX5780

## 2015-09-12 LAB — GLUCOSE, CAPILLARY: Glucose-Capillary: 154 mg/dL — ABNORMAL HIGH (ref 65–99)

## 2015-09-12 LAB — HM COLONOSCOPY: HM Colonoscopy: 6

## 2015-09-12 SURGERY — COLONOSCOPY WITH PROPOFOL
Anesthesia: General

## 2015-09-12 MED ORDER — PROPOFOL 500 MG/50ML IV EMUL
INTRAVENOUS | Status: DC | PRN
  Administered 2015-09-12: 100 ug/kg/min via INTRAVENOUS

## 2015-09-12 MED ORDER — METOPROLOL SUCCINATE ER 50 MG PO TB24
ORAL_TABLET | ORAL | Status: AC
  Administered 2015-09-12: 50 mg via ORAL
  Filled 2015-09-12: qty 1

## 2015-09-12 MED ORDER — SODIUM CHLORIDE 0.9 % IV SOLN
INTRAVENOUS | Status: DC
  Administered 2015-09-12: 1000 mL via INTRAVENOUS

## 2015-09-12 MED ORDER — EPHEDRINE SULFATE 50 MG/ML IJ SOLN
INTRAMUSCULAR | Status: DC | PRN
  Administered 2015-09-12 (×2): 5 mg via INTRAVENOUS

## 2015-09-12 MED ORDER — SODIUM CHLORIDE 0.9 % IV SOLN: INTRAVENOUS | Status: DC

## 2015-09-12 MED ORDER — FENTANYL CITRATE (PF) 100 MCG/2ML IJ SOLN
INTRAMUSCULAR | Status: DC | PRN
  Administered 2015-09-12: 50 ug via INTRAVENOUS

## 2015-09-12 NOTE — Transfer of Care (Signed)
Immediate Anesthesia Transfer of Care Note  Patient: Holly Rose  Procedure(s) Performed: Procedure(s): COLONOSCOPY WITH PROPOFOL (N/A)  Patient Location: PACU  Anesthesia Type:General  Level of Consciousness: awake and sedated  Airway & Oxygen Therapy: Patient Spontanous Breathing and Patient connected to nasal cannula oxygen  Post-op Assessment: Report given to RN and Post -op Vital signs reviewed and stable  Post vital signs: Reviewed and stable  Last Vitals:  Filed Vitals:   09/12/15 0721  BP: 162/72  Pulse: 88  Temp: 36.8 C  Resp: 16    Complications: No apparent anesthesia complications

## 2015-09-12 NOTE — H&P (Signed)
Primary Care Physician:  [name], MD Primary Gastroenterologist:  Dr. Vira Agar  Pre-Procedure History & Physical: HPI:  Holly Rose is a 81 y.o. female is here for an colonoscopy.   Past Medical History  Diagnosis Date  . Diabetes ([place])   . Atrial fibrillation ([place])   . Hyperlipidemia   . Anemia   . Neuropathy ([place])   . TIA (transient ischemic attack)   . Arthritis   . Allergy   . Heart murmur   . Stroke ([place])   . CAD (coronary artery disease)   . History of sick sinus syndrome     s/p pacemaker placement  . Neuropathy ([place])   . Macular degeneration   . Cancer [facility])     H/O skin cancer, squamous    Past Surgical History  Procedure Laterality Date  . Psychologist, forensic    . Breast surgery    . Appendectomy    . Abdominal hysterectomy    . Tonsillectomy and adenoidectomy    . Bladder surgery      1975 and 1995  . Rectocele repair  1996  . Cataract extraction  1997 and 1998  . Cardiac catheterization  1989    Prior to Admission medications   Medication Sig Start Date End Date Taking? Authorizing Provider  aspirin 81 MG tablet Take 81 mg by mouth daily.   Yes Historical Provider, MD  blood glucose meter kit and supplies Per patient please dispense One touch Ultra2 meter.Use meter and supplies three times a day to check blood sugar. ICD10: E11.40 07/25/15  Yes [name], MD  digoxin (LANOXIN) 0.125 MG tablet  12/04/13  Yes Historical Provider, MD  fluconazole (DIFLUCAN) 150 MG tablet Take 1 tablet (150 mg total) by mouth once. If no improvement, then repeat x 1 in 3 days 06/11/15  Yes [name], MD  furosemide (LASIX) 20 MG tablet Take 20 mg by mouth.   Yes Historical Provider, MD  glimepiride (AMARYL) 4 MG tablet  12/04/13  Yes Historical Provider, MD  glucose blood test strip Use three times a day to check blood sugar. Please dispense One Touch Ultra 2 strips to go with meter. ICD-10:E11.40 07/25/15  Yes [name], MD  JANUVIA 100 MG tablet TAKE ONE  TABLET BY MOUTH ONCE DAILY 05/13/15  Yes [name], MD  metFORMIN (GLUCOPHAGE) 500 MG tablet Take 1 tablet (500 mg total) by mouth 2 (two) times daily with a meal. 06/11/15  Yes [name], MD  metoprolol succinate (TOPROL-XL) 50 MG 24 hr tablet Take 50 mg by mouth daily. Take with or immediately following a meal.   Yes Historical Provider, MD  Multiple Vitamins-Minerals (MULTIVITAMIN PO) Take by mouth daily.   Yes Historical Provider, MD  Multiple Vitamins-Minerals (PRESERVISION AREDS 2 PO) Take by mouth.   Yes Historical Provider, MD  Probiotic Product (PROBIOTIC PO) Take by mouth daily.   Yes Historical Provider, MD  sitaGLIPtin (JANUVIA) 100 MG tablet Take 1 tablet (100 mg total) by mouth daily. 06/11/15  Yes [name], MD  vitamin B-12 (CYANOCOBALAMIN) 1000 MCG tablet Take 1,000 mcg by mouth daily.   Yes Historical Provider, MD    Allergies as of 08/22/2015 - Review Complete 08/21/2015  Allergen Reaction Noted  . Lyrica [pregabalin] Other (See Comments) and Swelling 01/08/2014  . Neurontin [gabapentin] Other (See Comments) and Nausea And Vomiting 01/08/2014    Family History  Problem Relation Age of Onset  . Stroke Mother   . Diabetes Mother   .  Cancer Maternal Aunt     Breast Cancer  . Arthritis Maternal Grandmother   . Cancer Maternal Aunt     Lung Cancer    Social History   Social History  . Marital Status: Single    Spouse Name: N/A  . Number of Children: N/A  . Years of Education: N/A   Occupational History  . Not on file.   Social History Main Topics  . Smoking status: Never Smoker   . Smokeless tobacco: Never Used  . Alcohol Use: No  . Drug Use: No  . Sexual Activity: Not on file   Other Topics Concern  . Not on file   Social History Narrative    Review of Systems: See HPI, otherwise negative ROS  Physical Exam: BP 162/72 mmHg  Pulse 88  Temp(Src) 98.2 F (36.8 C) (Tympanic)  Resp 16  Ht 5' 3"  (1.6 m)  Wt 76.204 kg (168 lb)  BMI  29.77 kg/m2  SpO2 98% General:   Alert,  pleasant and cooperative in NAD Head:  Normocephalic and atraumatic. Neck:  Supple; no masses or thyromegaly. Lungs:  Clear throughout to auscultation.    Heart:  Irregular rhythm s/w/atrial fib. Abdomen:  Soft, nontender and nondistended. Normal bowel sounds, without guarding, and without rebound.   Neurologic:  Alert and  oriented x4;  grossly normal neurologically.  Impression/Plan: Holly Rose is here for an colonoscopy to be performed for rectal bleeding, abnormal CT of abdomen  Risks, benefits, limitations, and alternatives regarding  colonoscopy have been reviewed with the patient.  Questions have been answered.  All parties agreeable.   Gaylyn Cheers, MD  09/12/2015, 7:37 AM

## 2015-09-12 NOTE — Anesthesia Procedure Notes (Signed)
Performed by: Vaughan Sine Pre-anesthesia Checklist: Emergency Drugs available, Patient identified, Suction available, Patient being monitored and Timeout performed Patient Re-evaluated:Patient Re-evaluated prior to inductionOxygen Delivery Method: Nasal cannula Preoxygenation: Pre-oxygenation with 100% oxygen Intubation Type: IV induction Placement Confirmation: CO2 detector

## 2015-09-12 NOTE — Op Note (Signed)
[facility] Gastroenterology Patient Name: Holly Rose Procedure Date: 09/12/2015 7:49 AM MRN: [id number] Account #: [id number] Date of Birth: 11/27/1929 Admit Type: Outpatient Age: 81 Room: [facility] ENDO ROOM 1 Gender: Female Note Status: Finalized Procedure:         Colonoscopy Indications:       Rectal bleeding Providers:         [name], MD Referring MD:      [name], MD (Referring MD) Medicines:         Propofol per Anesthesia Complications:     No immediate complications. Procedure:         Pre-Anesthesia Assessment:                    - After reviewing the risks and benefits, the patient was                     deemed in satisfactory condition to undergo the procedure.                    After obtaining informed consent, the colonoscope was                     passed under direct vision. Throughout the procedure, the                     patient's blood pressure, pulse, and oxygen saturations                     were monitored continuously. The Colonoscope was                     introduced through the anus and advanced to the the cecum,                     identified by appendiceal orifice and ileocecal valve. The                     colonoscopy was somewhat difficult due to restricted                     mobility of the colon. Successful completion of the                     procedure was aided by placed on her back and sigmoid                     became more open. Stool present in clumps. Findings:      Six sessile polyps were found in the transverse colon, in the ascending       colon and in the cecum. The polyps were small in size. These polyps were       removed with a cold snare. Resection and retrieval were complete.      A diminutive polyp was found in the cecum. The polyp was sessile. The       polyp was removed with a jumbo cold forceps. Resection and retrieval       were complete.      A few small patchy angioectasias without  bleeding were found in the       cecum. Coagulation for tissue destruction using argon plasma at 0.4       liters/minute and 20 watts was successful.      Multiple small and large-mouthed diverticula were  found in the sigmoid       colon, in the descending colon and in the transverse colon.      Internal hemorrhoids were found during endoscopy. The hemorrhoids were       small and Grade I (internal hemorrhoids that do not prolapse). A clip       was used on one spot. Impression:        - Six small polyps in the transverse colon, in the                     ascending colon and in the cecum. Resected and retrieved.                    - One diminutive polyp in the cecum. Resected and                     retrieved.                    - A few non-bleeding colonic angioectasias. Treated with                     argon plasma coagulation (APC).                    - Diverticulosis in the sigmoid colon, in the descending                     colon and in the transverse colon.                    - Internal hemorrhoids. Recommendation:    - Await pathology results. Procedure Code(s): --- Professional ---                    [phone], 59, Colonoscopy, flexible; with ablation of                     tumor(s), polyp(s), or other lesion(s) (includes pre- and                     post-dilation and guide wire passage, when performed)                    45385, Colonoscopy, flexible; with removal of tumor(s),                     polyp(s), or other lesion(s) by snare technique                    45380, 3, Colonoscopy, flexible; with biopsy, single or                     multiple CPT copyright 2014 American Medical Association. All rights reserved. The codes documented in this report are preliminary and upon coder review may  be revised to meet current compliance requirements. [name], MD 09/12/2015 8:46:52 AM This report has been signed electronically. Number of Addenda: 0 Note Initiated On: 09/12/2015  7:49 AM Scope Withdrawal Time: 0 hours 22 minutes 4 seconds  Total Procedure Duration: 0 hours 37 minutes 55 seconds       [facility]

## 2015-09-12 NOTE — Anesthesia Preprocedure Evaluation (Signed)
Anesthesia Evaluation  Patient identified by MRN, date of birth, ID band Patient awake    Reviewed: Allergy & Precautions, H&P , NPO status , Patient's Chart, lab work & pertinent test results, reviewed documented beta blocker date and time   Airway Mallampati: II  TM Distance: >3 FB Neck ROM: full    Dental no notable dental hx. (+) Upper Dentures, Lower Dentures   Pulmonary neg pulmonary ROS,    Pulmonary exam normal breath sounds clear to auscultation       Cardiovascular Exercise Tolerance: Good negative cardio ROS   Rhythm:regular Rate:Normal     Neuro/Psych TIACVA negative neurological ROS  negative psych ROS   GI/Hepatic negative GI ROS, Neg liver ROS,   Endo/Other  negative endocrine ROSdiabetes  Renal/GU negative Renal ROS  negative genitourinary   Musculoskeletal   Abdominal   Peds  Hematology negative hematology ROS (+) anemia ,   Anesthesia Other Findings   Reproductive/Obstetrics negative OB ROS                             Anesthesia Physical Anesthesia Plan  ASA: III  Anesthesia Plan: General   Post-op Pain Management:    Induction:   Airway Management Planned:   Additional Equipment:   Intra-op Plan:   Post-operative Plan:   Informed Consent: I have reviewed the patients History and Physical, chart, labs and discussed the procedure including the risks, benefits and alternatives for the proposed anesthesia with the patient or authorized representative who has indicated his/her understanding and acceptance.   Dental Advisory Given  Plan Discussed with: CRNA  Anesthesia Plan Comments:         Anesthesia Quick Evaluation

## 2015-09-13 LAB — SURGICAL PATHOLOGY

## 2015-09-16 ENCOUNTER — Encounter: Payer: Self-pay | Admitting: Internal Medicine

## 2015-09-16 DIAGNOSIS — Z86010 Personal history of colonic polyps: Secondary | ICD-10-CM | POA: Insufficient documentation

## 2015-09-17 ENCOUNTER — Encounter: Payer: Self-pay | Admitting: Unknown Physician Specialty

## 2015-09-17 NOTE — Anesthesia Postprocedure Evaluation (Signed)
Anesthesia Post Note  Patient: Elleen A Killgore  Procedure(s) Performed: Procedure(s) (LRB): COLONOSCOPY WITH PROPOFOL (N/A)  Patient location during evaluation: PACU Anesthesia Type: General Level of consciousness: awake and alert Pain management: pain level controlled Vital Signs Assessment: post-procedure vital signs reviewed and stable Respiratory status: spontaneous breathing, nonlabored ventilation, respiratory function stable and patient connected to nasal cannula oxygen Cardiovascular status: blood pressure returned to baseline and stable Postop Assessment: no signs of nausea or vomiting Anesthetic complications: no    Last Vitals:  Filed Vitals:   09/12/15 0850 09/12/15 0900  BP: 98/60 122/61  Pulse: 69 59  Temp:    Resp: 11 13    Last Pain:  Filed Vitals:   09/13/15 0755  PainSc: 0-No pain                 Molli Barrows

## 2015-09-25 DIAGNOSIS — H353221 Exudative age-related macular degeneration, left eye, with active choroidal neovascularization: Secondary | ICD-10-CM | POA: Diagnosis not present

## 2015-10-01 DIAGNOSIS — Z6830 Body mass index (BMI) 30.0-30.9, adult: Secondary | ICD-10-CM | POA: Diagnosis not present

## 2015-10-01 DIAGNOSIS — Z86010 Personal history of colonic polyps: Secondary | ICD-10-CM | POA: Diagnosis not present

## 2015-10-01 DIAGNOSIS — K922 Gastrointestinal hemorrhage, unspecified: Secondary | ICD-10-CM | POA: Diagnosis not present

## 2015-10-01 DIAGNOSIS — D500 Iron deficiency anemia secondary to blood loss (chronic): Secondary | ICD-10-CM | POA: Diagnosis not present

## 2015-10-01 LAB — CBC AND DIFFERENTIAL
HCT: 44 % (ref 36–46)
Hemoglobin: 13.9 g/dL (ref 12.0–16.0)
Neutrophils Absolute: 4 /uL
Platelets: 182 10*3/uL (ref 150–399)
WBC: 6.3 10^3/mL

## 2015-10-04 ENCOUNTER — Encounter: Payer: Self-pay | Admitting: Internal Medicine

## 2015-10-09 DIAGNOSIS — H353211 Exudative age-related macular degeneration, right eye, with active choroidal neovascularization: Secondary | ICD-10-CM | POA: Diagnosis not present

## 2015-10-10 DIAGNOSIS — I495 Sick sinus syndrome: Secondary | ICD-10-CM | POA: Diagnosis not present

## 2015-10-14 ENCOUNTER — Encounter: Payer: Self-pay | Admitting: Internal Medicine

## 2015-10-14 ENCOUNTER — Ambulatory Visit (INDEPENDENT_AMBULATORY_CARE_PROVIDER_SITE_OTHER): Payer: Medicare Other | Admitting: Internal Medicine

## 2015-10-14 VITALS — BP 118/70 | HR 95 | Temp 97.5°F | Resp 18 | Ht 62.5 in | Wt 174.2 lb

## 2015-10-14 DIAGNOSIS — K625 Hemorrhage of anus and rectum: Secondary | ICD-10-CM | POA: Diagnosis not present

## 2015-10-14 DIAGNOSIS — R1084 Generalized abdominal pain: Secondary | ICD-10-CM

## 2015-10-14 DIAGNOSIS — D509 Iron deficiency anemia, unspecified: Secondary | ICD-10-CM | POA: Diagnosis not present

## 2015-10-14 DIAGNOSIS — E1140 Type 2 diabetes mellitus with diabetic neuropathy, unspecified: Secondary | ICD-10-CM

## 2015-10-14 DIAGNOSIS — I4891 Unspecified atrial fibrillation: Secondary | ICD-10-CM

## 2015-10-14 DIAGNOSIS — R197 Diarrhea, unspecified: Secondary | ICD-10-CM | POA: Diagnosis not present

## 2015-10-14 NOTE — Progress Notes (Signed)
Pre-visit discussion using our clinic review tool. No additional management support is needed unless otherwise documented below in the visit note.  

## 2015-10-14 NOTE — Progress Notes (Signed)
Patient ID: Holly Rose, female   DOB: 09/14/1930, 82 y.o.   MRN: [id number]   Subjective:    Patient ID: Holly Rose, female    DOB: 1930-08-23, 82 y.o.   MRN: [id number]  HPI  Patient with past history of hypercholesterolemia, diabetes, afib and previous rectal bleeding.  She comes in today to follow up on these issues.  She is accompanied by her daughter-n-law.  History obtained from both of them.  She had recent colonoscopy.  Found to have 7 polyps, few non bleeding colonic angioectasias, diverticulosis and internal hemorrhoids.  She has not had any more bleeding.  She was since on her stomach 10 days ago.  Some loose stool and abdominal pain.  This is better.  Still with some minimal abdominal discomfort, but is better.  Eating and drinking.  Bowels stable.  Recently had CT scan.  Found to have adnexal fullness.  Pelvic ultrasound revealed 2.8 cm simple cyst right ovary.  Radiology recommended one year f/u ultrasound.  Discussed with her again today.  She wants to hold on any further w/up or evaluation for this - at this time.  States will notify me if changes her mind.  States her sugars have been averaging 160s in the am and 130-180 in the pm.  No lows.     Past Medical History  Diagnosis Date  . Diabetes ([place])   . Atrial fibrillation ([place])   . Hyperlipidemia   . Anemia   . Neuropathy ([place])   . TIA (transient ischemic attack)   . Arthritis   . Allergy   . Heart murmur   . Stroke ([place])   . CAD (coronary artery disease)   . History of sick sinus syndrome     s/p pacemaker placement  . Neuropathy ([place])   . Macular degeneration   . Cancer [facility])     H/O skin cancer, squamous   Past Surgical History  Procedure Laterality Date  . Psychologist, forensic    . Breast surgery    . Appendectomy    . Abdominal hysterectomy    . Tonsillectomy and adenoidectomy    . Bladder surgery      1975 and 1995  . Rectocele repair  1996  . Cataract extraction  1997 and 1998  . Cardiac  catheterization  1989  . Colonoscopy with propofol N/A 09/12/2015    Procedure: COLONOSCOPY WITH PROPOFOL;  Surgeon: [name], MD;  Location: [facility] ENDOSCOPY;  Service: Endoscopy;  Laterality: N/A;   Family History  Problem Relation Age of Onset  . Stroke Mother   . Diabetes Mother   . Cancer Maternal Aunt     Breast Cancer  . Arthritis Maternal Grandmother   . Cancer Maternal Aunt     Lung Cancer   Social History   Social History  . Marital Status: Single    Spouse Name: N/A  . Number of Children: N/A  . Years of Education: N/A   Social History Main Topics  . Smoking status: Never Smoker   . Smokeless tobacco: Never Used  . Alcohol Use: No  . Drug Use: No  . Sexual Activity: Not Asked   Other Topics Concern  . None   Social History Narrative    Outpatient Encounter Prescriptions as of 10/14/2015  Medication Sig  . aspirin 81 MG tablet Take 81 mg by mouth daily.  . blood glucose meter kit and supplies Per patient please dispense One touch Ultra2 meter.Use meter and supplies three times  a day to check blood sugar. ICD10: E11.40  . digoxin (LANOXIN) 0.125 MG tablet   . furosemide (LASIX) 20 MG tablet Take 20 mg by mouth.  Marland Kitchen glimepiride (AMARYL) 4 MG tablet   . glucose blood test strip Use three times a day to check blood sugar. Please dispense One Touch Ultra 2 strips to go with meter. ICD-10:E11.40  . metFORMIN (GLUCOPHAGE) 500 MG tablet Take 1 tablet (500 mg total) by mouth 2 (two) times daily with a meal.  . metoprolol succinate (TOPROL-XL) 50 MG 24 hr tablet Take 50 mg by mouth daily. Take with or immediately following a meal.  . Multiple Vitamins-Minerals (MULTIVITAMIN PO) Take by mouth daily.  . Multiple Vitamins-Minerals (PRESERVISION AREDS 2 PO) Take by mouth.  . Probiotic Product (PROBIOTIC PO) Take by mouth daily.  . sitaGLIPtin (JANUVIA) 100 MG tablet Take 1 tablet (100 mg total) by mouth daily.  . vitamin B-12 (CYANOCOBALAMIN) 1000 MCG tablet Take  1,000 mcg by mouth daily.  . [DISCONTINUED] JANUVIA 100 MG tablet TAKE ONE TABLET BY MOUTH ONCE DAILY  . [DISCONTINUED] fluconazole (DIFLUCAN) 150 MG tablet Take 1 tablet (150 mg total) by mouth once. If no improvement, then repeat x 1 in 3 days   No facility-administered encounter medications on file as of 10/14/2015.    Review of Systems  Constitutional: Negative for appetite change and unexpected weight change.  HENT: Negative for congestion and sinus pressure.   Respiratory: Negative for cough, chest tightness and shortness of breath.   Cardiovascular: Negative for chest pain, palpitations and leg swelling.  Gastrointestinal: Positive for abdominal pain. Negative for nausea and vomiting.       Diarrhea has resolved.    Genitourinary: Negative for dysuria and difficulty urinating.  Musculoskeletal: Negative for back pain and joint swelling.  Skin: Negative for color change and rash.  Neurological: Negative for dizziness, light-headedness and headaches.  Psychiatric/Behavioral: Negative for dysphoric mood and agitation.       Objective:    Physical Exam  Constitutional: She appears well-developed and well-nourished. No distress.  HENT:  Nose: Nose normal.  Mouth/Throat: Oropharynx is clear and moist.  Neck: Neck supple. No thyromegaly present.  Cardiovascular: Normal rate and regular rhythm.   Pulmonary/Chest: Breath sounds normal. No respiratory distress. She has no wheezes.  Abdominal: Soft. Bowel sounds are normal.  Minimal tenderness to palpation.    Musculoskeletal: She exhibits no edema or tenderness.  Lymphadenopathy:    She has no cervical adenopathy.  Skin: No rash noted. No erythema.  Psychiatric: She has a normal mood and affect. Her behavior is normal.    BP 118/70 mmHg  Pulse 95  Temp(Src) 97.5 F (36.4 C) (Oral)  Resp 18  Ht 5' 2.5" (1.588 m)  Wt 174 lb 4 oz (79.039 kg)  BMI 31.34 kg/m2  SpO2 96% Wt Readings from Last 3 Encounters:  10/14/15 174 lb 4  oz (79.039 kg)  09/12/15 168 lb (76.204 kg)  08/13/15 174 lb (78.926 kg)     Lab Results  Component Value Date   WBC 6.3 10/01/2015   HGB 13.9 10/01/2015   HCT 44 10/01/2015   PLT 182 10/01/2015   GLUCOSE 194* 08/13/2015   CHOL 202* 06/21/2015   TRIG 125.0 06/21/2015   HDL 49.80 06/21/2015   LDLCALC 127* 06/21/2015   ALT 22 08/13/2015   AST 22 08/13/2015   NA 136 08/13/2015   K 4.4 08/13/2015   CL 101 08/13/2015   CREATININE 0.72 08/13/2015   BUN  18 08/13/2015   CO2 27 08/13/2015   TSH 3.56 02/27/2015   HGBA1C 8.0* 06/21/2015   MICROALBUR 10.4* 02/27/2015    US Transvaginal Non-ob  08/28/2015  CLINICAL DATA:  Adnexal cyst. EXAM: TRANSABDOMINAL AND TRANSVAGINAL ULTRASOUND OF PELVIS TECHNIQUE: Both transabdominal and transvaginal ultrasound examinations of the pelvis were performed. Transabdominal technique was performed for global imaging of the pelvis including uterus, ovaries, adnexal regions, and pelvic cul-de-sac. It was necessary to proceed with endovaginal exam following the transabdominal exam to visualize the uterus and ovaries. COMPARISON:  None FINDINGS: Uterus Hysterectomy. Right ovary Not well visualized. Hypoechoic region in the right adnexa is noted with a at 2.7 x 2.8 x 2.8 cm cyst. This is most likely the right ovary with a right ovarian small simple cyst. Left ovary Not visualized. Other findings No free fluid.  Difficult exam due to overlying bowel gas. IMPRESSION: Prior hysterectomy. Difficult exam due to overlying bowel gas. Probable 2.8 cm simple cyst right ovary. This is almost certainly benign, but follow up ultrasound is recommended in 1 year according to the Society of Radiologists in [place] Statement (D Clovis Riley et al. Management of Asymptomatic Ovarian and Other Adnexal Cysts Imaged at [place]: Society of Radiologists in [place] Statement 2010. Radiology 256 (Sept 2010): 502-774.). Electronically Signed   By:  Marcello Moores  Register   On: 08/28/2015 11:34   US Pelvis Complete  08/28/2015  CLINICAL DATA:  Adnexal cyst. EXAM: TRANSABDOMINAL AND TRANSVAGINAL ULTRASOUND OF PELVIS TECHNIQUE: Both transabdominal and transvaginal ultrasound examinations of the pelvis were performed. Transabdominal technique was performed for global imaging of the pelvis including uterus, ovaries, adnexal regions, and pelvic cul-de-sac. It was necessary to proceed with endovaginal exam following the transabdominal exam to visualize the uterus and ovaries. COMPARISON:  None FINDINGS: Uterus Hysterectomy. Right ovary Not well visualized. Hypoechoic region in the right adnexa is noted with a at 2.7 x 2.8 x 2.8 cm cyst. This is most likely the right ovary with a right ovarian small simple cyst. Left ovary Not visualized. Other findings No free fluid.  Difficult exam due to overlying bowel gas. IMPRESSION: Prior hysterectomy. Difficult exam due to overlying bowel gas. Probable 2.8 cm simple cyst right ovary. This is almost certainly benign, but follow up ultrasound is recommended in 1 year according to the Society of Radiologists in [place] Statement (D Clovis Riley et al. Management of Asymptomatic Ovarian and Other Adnexal Cysts Imaged at [place]: Society of Radiologists in [place] Statement 2010. Radiology 256 (Sept 2010): 128-786.). Electronically Signed   By: Marcello Moores  Register   On: 08/28/2015 11:34       Assessment & Plan:   Problem List Items Addressed This Visit    Abdominal pain    Had recent CT as outlined.  Pelvic ultrasound with right ovarian cyst.  Minimal pain on exam.  She desires no further w/up for the cyst.  Pain is better.  Bowels better.  Eating well.  Follow.  Notify me if pain does not completely resolve.        Anemia, iron deficiency    Followed by Dr Sterling Big.  hgb 76.7 on recent check.  Just had colonoscopy as outlined.        Atrial fibrillation ([place])   Relevant Orders    Digoxin level   Diabetes mellitus with neuropathy ([place])    Sugars as outlined.  No low sugars.  Continue same medication regimen.  Follow met b and a1c.  Relevant Orders   Hepatic function panel   Lipid panel   Hemoglobin W5I   Basic metabolic panel   Diarrhea    Better now.  Continue probiotic.  Follow.        Rectal bleeding - Primary    Had colonoscopy as outlined.  No further bleeding.  Follow.            [name], MD

## 2015-10-17 DIAGNOSIS — I482 Chronic atrial fibrillation: Secondary | ICD-10-CM | POA: Diagnosis not present

## 2015-10-17 DIAGNOSIS — E782 Mixed hyperlipidemia: Secondary | ICD-10-CM | POA: Diagnosis not present

## 2015-10-17 DIAGNOSIS — I4891 Unspecified atrial fibrillation: Secondary | ICD-10-CM | POA: Diagnosis not present

## 2015-10-17 DIAGNOSIS — E1169 Type 2 diabetes mellitus with other specified complication: Secondary | ICD-10-CM | POA: Diagnosis not present

## 2015-10-21 ENCOUNTER — Encounter: Payer: Self-pay | Admitting: Internal Medicine

## 2015-10-21 NOTE — Assessment & Plan Note (Signed)
Sugars as outlined.  No low sugars.  Continue same medication regimen.  Follow met b and a1c.

## 2015-10-21 NOTE — Assessment & Plan Note (Signed)
Had recent CT as outlined.  Pelvic ultrasound with right ovarian cyst.  Minimal pain on exam.  She desires no further w/up for the cyst.  Pain is better.  Bowels better.  Eating well.  Follow.  Notify me if pain does not completely resolve.

## 2015-10-21 NOTE — Assessment & Plan Note (Signed)
Had colonoscopy as outlined.  No further bleeding.  Follow.

## 2015-10-21 NOTE — Assessment & Plan Note (Signed)
Followed by Dr Sterling Big.  hgb [id number] on recent check.  Just had colonoscopy as outlined.

## 2015-10-21 NOTE — Assessment & Plan Note (Signed)
Better now.  Continue probiotic.  Follow.

## 2015-11-01 ENCOUNTER — Other Ambulatory Visit (INDEPENDENT_AMBULATORY_CARE_PROVIDER_SITE_OTHER): Payer: Medicare Other

## 2015-11-01 DIAGNOSIS — M3501 Sicca syndrome with keratoconjunctivitis: Secondary | ICD-10-CM | POA: Diagnosis not present

## 2015-11-01 DIAGNOSIS — I4891 Unspecified atrial fibrillation: Secondary | ICD-10-CM | POA: Diagnosis not present

## 2015-11-01 DIAGNOSIS — E1140 Type 2 diabetes mellitus with diabetic neuropathy, unspecified: Secondary | ICD-10-CM

## 2015-11-01 LAB — BASIC METABOLIC PANEL
BUN: 14 mg/dL (ref 6–23)
CO2: 27 mEq/L (ref 19–32)
Calcium: 10.1 mg/dL (ref 8.4–10.5)
Chloride: 101 mEq/L (ref 96–112)
Creatinine, Ser: 0.7 mg/dL (ref 0.40–1.20)
GFR: 84.74 mL/min (ref >60.00)
Glucose, Bld: 190 mg/dL — ABNORMAL HIGH (ref 70–99)
Potassium: 4.4 mEq/L (ref 3.5–5.1)
Sodium: 138 mEq/L (ref 135–145)

## 2015-11-01 LAB — LIPID PANEL
Cholesterol: 212 mg/dL — ABNORMAL HIGH (ref 0–200)
HDL: 55.3 mg/dL (ref >39.00)
LDL Cholesterol: 132 mg/dL — ABNORMAL HIGH (ref 0–99)
NonHDL: 156.69
Total CHOL/HDL Ratio: 4
Triglycerides: 125 mg/dL (ref 0.0–149.0)
VLDL: 25 mg/dL (ref 0.0–40.0)

## 2015-11-01 LAB — HEPATIC FUNCTION PANEL
ALT: 21 U/L (ref 0–35)
AST: 20 U/L (ref 0–37)
Albumin: 4 g/dL (ref 3.5–5.2)
Alkaline Phosphatase: 51 U/L (ref 39–117)
Bilirubin, Direct: 0.1 mg/dL (ref 0.0–0.3)
Total Bilirubin: 0.4 mg/dL (ref 0.2–1.2)
Total Protein: 7.5 g/dL (ref 6.0–8.3)

## 2015-11-01 LAB — HEMOGLOBIN A1C: Hgb A1c MFr Bld: 8.8 % — ABNORMAL HIGH (ref 4.6–6.5)

## 2015-11-01 NOTE — Progress Notes (Signed)
Patient here today for lab draw only. 

## 2015-11-02 LAB — DIGOXIN LEVEL: Digoxin Level: 0.7 ug/L — ABNORMAL LOW (ref 0.8–2.0)

## 2015-11-03 ENCOUNTER — Encounter: Payer: Self-pay | Admitting: Internal Medicine

## 2015-11-20 ENCOUNTER — Encounter: Payer: Self-pay | Admitting: Podiatry

## 2015-11-20 ENCOUNTER — Ambulatory Visit (INDEPENDENT_AMBULATORY_CARE_PROVIDER_SITE_OTHER): Payer: Medicare Other | Admitting: Podiatry

## 2015-11-20 DIAGNOSIS — Q828 Other specified congenital malformations of skin: Secondary | ICD-10-CM | POA: Diagnosis not present

## 2015-11-20 DIAGNOSIS — M79676 Pain in unspecified toe(s): Secondary | ICD-10-CM

## 2015-11-20 DIAGNOSIS — E1142 Type 2 diabetes mellitus with diabetic polyneuropathy: Secondary | ICD-10-CM

## 2015-11-20 DIAGNOSIS — B351 Tinea unguium: Secondary | ICD-10-CM

## 2015-11-20 NOTE — Progress Notes (Signed)
She presents today for a chief complaint painful elongated toenails and calluses plantar aspect of the bilateral foot.  Objective: Vital signs are stable she is alert and oriented 3. Pulses are strongly palpable. Her toenails are thick yellow dystrophic onychomycotic and multiple porokeratotic lesions plantar aspect of the bilateral foot.  Assessment: Pain in limb secondary to onychomycosis 1 through 5 bilateral. Porokeratosis plantar aspect of bilateral foot with diabetes mellitus and diabetic peripheral neuropathy.  Plan: Discussed etiology pathology conservative or surgical therapies. Debrided toenails 1 through 5 bilateral. Debrided all reactive hyperkeratotic tissues bilateral.

## 2015-11-21 DIAGNOSIS — H353221 Exudative age-related macular degeneration, left eye, with active choroidal neovascularization: Secondary | ICD-10-CM | POA: Diagnosis not present

## 2015-11-25 DIAGNOSIS — H353211 Exudative age-related macular degeneration, right eye, with active choroidal neovascularization: Secondary | ICD-10-CM | POA: Diagnosis not present

## 2015-11-27 DIAGNOSIS — H353213 Exudative age-related macular degeneration, right eye, with inactive scar: Secondary | ICD-10-CM | POA: Diagnosis not present

## 2015-12-19 DIAGNOSIS — H353221 Exudative age-related macular degeneration, left eye, with active choroidal neovascularization: Secondary | ICD-10-CM | POA: Diagnosis not present

## 2015-12-24 ENCOUNTER — Ambulatory Visit (INDEPENDENT_AMBULATORY_CARE_PROVIDER_SITE_OTHER): Payer: Medicare Other | Admitting: Internal Medicine

## 2015-12-24 ENCOUNTER — Encounter: Payer: Self-pay | Admitting: Internal Medicine

## 2015-12-24 VITALS — BP 120/70 | HR 92 | Temp 98.0°F | Resp 18 | Ht 62.5 in | Wt 175.5 lb

## 2015-12-24 DIAGNOSIS — E7800 Pure hypercholesterolemia, unspecified: Secondary | ICD-10-CM | POA: Diagnosis not present

## 2015-12-24 DIAGNOSIS — D509 Iron deficiency anemia, unspecified: Secondary | ICD-10-CM

## 2015-12-24 DIAGNOSIS — I4891 Unspecified atrial fibrillation: Secondary | ICD-10-CM | POA: Diagnosis not present

## 2015-12-24 DIAGNOSIS — Z86010 Personal history of colonic polyps: Secondary | ICD-10-CM

## 2015-12-24 DIAGNOSIS — R1084 Generalized abdominal pain: Secondary | ICD-10-CM | POA: Diagnosis not present

## 2015-12-24 DIAGNOSIS — E1140 Type 2 diabetes mellitus with diabetic neuropathy, unspecified: Secondary | ICD-10-CM

## 2015-12-24 NOTE — Progress Notes (Signed)
Pre-visit discussion using our clinic review tool. No additional management support is needed unless otherwise documented below in the visit note.  

## 2015-12-24 NOTE — Progress Notes (Signed)
Patient ID: Holly Rose, female   DOB: [birth year], 82 y.o.   MRN: [id number]   Subjective:    Patient ID: Holly Rose, female    DOB: 1930-01-11, 82 y.o.   MRN: [id number]  HPI  Patient here for a scheduled follow up.  She reports she is doing better.  Seeing Dr Druscilla Brownie for her eyes.  Still has intermittent abdominal discomfort.   Intermittent.  Not as bad as previous.  No triggers.  May last hours and then resolves.  No diarrhea.  Some occasional constipation.  No blood in her stool.  States her sugars in the am average 154-210 and pm are lower - 100.  Discussed diet and exercise.  Discussed further w/up for her abdominal discomfort.  Has had CT scan.  Right ovarian simple cyst.  Had colonoscopy.     Past Medical History  Diagnosis Date  . Diabetes (HCC)   . Atrial fibrillation (HCC)   . Hyperlipidemia   . Anemia   . Neuropathy (HCC)   . TIA (transient ischemic attack)   . Arthritis   . Allergy   . Heart murmur   . Stroke (HCC)   . CAD (coronary artery disease)   . History of sick sinus syndrome     s/p pacemaker placement  . Neuropathy (HCC)   . Macular degeneration   . Cancer [facility])     H/O skin cancer, squamous   Past Surgical History  Procedure Laterality Date  . Visual merchandiser    . Breast surgery    . Appendectomy    . Abdominal hysterectomy    . Tonsillectomy and adenoidectomy    . Bladder surgery      1975 and 1995  . Rectocele repair  1996  . Cataract extraction  1997 and 1998  . Cardiac catheterization  1989  . Colonoscopy with propofol N/A 09/12/2015    Procedure: COLONOSCOPY WITH PROPOFOL;  Surgeon: Scot Jun, MD;  Location: [facility] ENDOSCOPY;  Service: Endoscopy;  Laterality: N/A;   Family History  Problem Relation Age of Onset  . Stroke Mother   . Diabetes Mother   . Cancer Maternal Aunt     Breast Cancer  . Arthritis Maternal Grandmother   . Cancer Maternal Aunt     Lung Cancer   Social History   Social History  . Marital Status: Single     Spouse Name: N/A  . Number of Children: N/A  . Years of Education: N/A   Social History Main Topics  . Smoking status: Never Smoker   . Smokeless tobacco: Never Used  . Alcohol Use: No  . Drug Use: No  . Sexual Activity: Not Asked   Other Topics Concern  . None   Social History Narrative    Outpatient Encounter Prescriptions as of 12/24/2015  Medication Sig  . aspirin 81 MG tablet Take 81 mg by mouth daily.  . blood glucose meter kit and supplies Per patient please dispense One touch Ultra2 meter.Use meter and supplies three times a day to check blood sugar. ICD10: E11.40  . digoxin (LANOXIN) 0.125 MG tablet   . furosemide (LASIX) 20 MG tablet Take 20 mg by mouth.  Marland Kitchen glimepiride (AMARYL) 4 MG tablet   . glucose blood test strip Use three times a day to check blood sugar. Please dispense One Touch Ultra 2 strips to go with meter. ICD-10:E11.40  . metFORMIN (GLUCOPHAGE) 500 MG tablet Take 1 tablet (500 mg total) by mouth 2 (two) times daily  with a meal.  . metoprolol (LOPRESSOR) 50 MG tablet   . metoprolol succinate (TOPROL-XL) 50 MG 24 hr tablet Take 50 mg by mouth daily. Take with or immediately following a meal.  . Multiple Vitamins-Minerals (MULTIVITAMIN PO) Take by mouth daily.  . Multiple Vitamins-Minerals (PRESERVISION AREDS 2 PO) Take by mouth.  . Probiotic Product (PROBIOTIC PO) Take by mouth daily.  . sitaGLIPtin (JANUVIA) 100 MG tablet Take 1 tablet (100 mg total) by mouth daily.  . vitamin B-12 (CYANOCOBALAMIN) 1000 MCG tablet Take 1,000 mcg by mouth daily.   No facility-administered encounter medications on file as of 12/24/2015.    Review of Systems  Constitutional: Negative for appetite change and unexpected weight change.  HENT: Negative for congestion and sinus pressure.   Respiratory: Negative for cough, chest tightness and shortness of breath.   Cardiovascular: Negative for chest pain, palpitations and leg swelling.  Gastrointestinal: Positive for  abdominal pain and constipation (occasional constipation. ). Negative for nausea, vomiting and diarrhea.  Genitourinary: Negative for dysuria and difficulty urinating.  Musculoskeletal: Negative for back pain and joint swelling.  Skin: Negative for color change and rash.  Neurological: Negative for dizziness, light-headedness and headaches.  Psychiatric/Behavioral: Negative for dysphoric mood and agitation.       Objective:    Physical Exam  Constitutional: She appears well-developed and well-nourished. No distress.  HENT:  Nose: Nose normal.  Mouth/Throat: Oropharynx is clear and moist.  Neck: Neck supple. No thyromegaly present.  Cardiovascular: Normal rate and regular rhythm.   Pulmonary/Chest: Breath sounds normal. No respiratory distress. She has no wheezes.  Abdominal: Soft. Bowel sounds are normal.  Minimal tenderness lower abdomen.    Musculoskeletal: She exhibits no edema or tenderness.  Lymphadenopathy:    She has no cervical adenopathy.  Skin: No rash noted. No erythema.  Psychiatric: She has a normal mood and affect. Her behavior is normal.    BP 120/70 mmHg  Pulse 92  Temp(Src) 98 F (36.7 C) (Oral)  Resp 18  Ht 5' 2.5" (1.588 m)  Wt 175 lb 8 oz (79.606 kg)  BMI 31.57 kg/m2  SpO2 94% Wt Readings from Last 3 Encounters:  12/24/15 175 lb 8 oz (79.606 kg)  10/14/15 174 lb 4 oz (79.039 kg)  09/12/15 168 lb (76.204 kg)     Lab Results  Component Value Date   WBC 6.3 10/01/2015   HGB 13.9 10/01/2015   HCT 44 10/01/2015   PLT 182 10/01/2015   GLUCOSE 190* 11/01/2015   CHOL 212* 11/01/2015   TRIG 125.0 11/01/2015   HDL 55.30 11/01/2015   LDLCALC 132* 11/01/2015   ALT 21 11/01/2015   AST 20 11/01/2015   NA 138 11/01/2015   K 4.4 11/01/2015   CL 101 11/01/2015   CREATININE 0.70 11/01/2015   BUN 14 11/01/2015   CO2 27 11/01/2015   TSH 3.56 02/27/2015   HGBA1C 8.8* 11/01/2015   MICROALBUR 10.4* 02/27/2015    US Transvaginal Non-ob  08/28/2015   CLINICAL DATA:  Adnexal cyst. EXAM: TRANSABDOMINAL AND TRANSVAGINAL ULTRASOUND OF PELVIS TECHNIQUE: Both transabdominal and transvaginal ultrasound examinations of the pelvis were performed. Transabdominal technique was performed for global imaging of the pelvis including uterus, ovaries, adnexal regions, and pelvic cul-de-sac. It was necessary to proceed with endovaginal exam following the transabdominal exam to visualize the uterus and ovaries. COMPARISON:  None FINDINGS: Uterus Hysterectomy. Right ovary Not well visualized. Hypoechoic region in the right adnexa is noted with a at 2.7 x 2.8 x 2.8  cm cyst. This is most likely the right ovary with a right ovarian small simple cyst. Left ovary Not visualized. Other findings No free fluid.  Difficult exam due to overlying bowel gas. IMPRESSION: Prior hysterectomy. Difficult exam due to overlying bowel gas. Probable 2.8 cm simple cyst right ovary. This is almost certainly benign, but follow up ultrasound is recommended in 1 year according to the Society of Radiologists in [place] Statement (D Clovis Riley et al. Management of Asymptomatic Ovarian and Other Adnexal Cysts Imaged at [place]: Society of Radiologists in [place] Statement 2010. Radiology 256 (Sept 2010): 903-009.). Electronically Signed   By: Marcello Moores  Register   On: 08/28/2015 11:34   US Pelvis Complete  08/28/2015  CLINICAL DATA:  Adnexal cyst. EXAM: TRANSABDOMINAL AND TRANSVAGINAL ULTRASOUND OF PELVIS TECHNIQUE: Both transabdominal and transvaginal ultrasound examinations of the pelvis were performed. Transabdominal technique was performed for global imaging of the pelvis including uterus, ovaries, adnexal regions, and pelvic cul-de-sac. It was necessary to proceed with endovaginal exam following the transabdominal exam to visualize the uterus and ovaries. COMPARISON:  None FINDINGS: Uterus Hysterectomy. Right ovary Not well visualized. Hypoechoic region in the  right adnexa is noted with a at 2.7 x 2.8 x 2.8 cm cyst. This is most likely the right ovary with a right ovarian small simple cyst. Left ovary Not visualized. Other findings No free fluid.  Difficult exam due to overlying bowel gas. IMPRESSION: Prior hysterectomy. Difficult exam due to overlying bowel gas. Probable 2.8 cm simple cyst right ovary. This is almost certainly benign, but follow up ultrasound is recommended in 1 year according to the Society of Radiologists in [place] Statement (D Clovis Riley et al. Management of Asymptomatic Ovarian and Other Adnexal Cysts Imaged at [place]: Society of Radiologists in [place] Statement 2010. Radiology 256 (Sept 2010): 233-007.). Electronically Signed   By: Marcello Moores  Register   On: 08/28/2015 11:34       Assessment & Plan:   Problem List Items Addressed This Visit    Abdominal pain - Primary    Had recent CT as outlined.  Pelvic ultrasound revealed a right ovarian cyst.  She desires no further w/up for the cyst.  Pain is intermittent.  Has had recent colonoscopy.  Given persistent intermittent pain, will refer back to GI for further evaluation and question of need for any further w/up.        Relevant Orders   Ambulatory referral to Gastroenterology   Anemia, iron deficiency    Followed by Dr Sterling Big.  Has been stable.  Colonoscopy as outlined.       Atrial fibrillation ([place])    Followed by Dr Ubaldo Glassing.  Stable.  Pacemaker in place for history of sick sinus syndrome.  Follow.        Diabetes mellitus with neuropathy (HCC)    Sugars as outlined.  Discussed medication.  Discussed diet and exercise.  Follow sugars.  Follow met b and a1c.       Relevant Orders   Hemoglobin M2U   Basic metabolic panel   TSH   Microalbumin / creatinine urine ratio   History of colonic polyps    Just had colonoscopy 09/12/15 as outlined in overview.  Bowels appear to be overall stable.       Hypercholesterolemia    Low  cholesterol diet and exercise.  Follow lipid panel.        Relevant Orders   Lipid panel   Hepatic function panel  [name], MD

## 2015-12-29 ENCOUNTER — Encounter: Payer: Self-pay | Admitting: Internal Medicine

## 2015-12-29 NOTE — Assessment & Plan Note (Signed)
Sugars as outlined.  Discussed medication.  Discussed diet and exercise.  Follow sugars.  Follow met b and a1c.

## 2015-12-29 NOTE — Assessment & Plan Note (Signed)
Followed by Dr Ubaldo Glassing.  Stable.  Pacemaker in place for history of sick sinus syndrome.  Follow.

## 2015-12-29 NOTE — Assessment & Plan Note (Signed)
Had recent CT as outlined.  Pelvic ultrasound revealed a right ovarian cyst.  She desires no further w/up for the cyst.  Pain is intermittent.  Has had recent colonoscopy.  Given persistent intermittent pain, will refer back to GI for further evaluation and question of need for any further w/up.

## 2015-12-29 NOTE — Assessment & Plan Note (Signed)
Just had colonoscopy 09/12/15 as outlined in overview.  Bowels appear to be overall stable.

## 2015-12-29 NOTE — Assessment & Plan Note (Signed)
Low cholesterol diet and exercise.  Follow lipid panel.   

## 2015-12-29 NOTE — Assessment & Plan Note (Signed)
Followed by Dr Sterling Big.  Has been stable.  Colonoscopy as outlined.

## 2016-01-06 DIAGNOSIS — H353211 Exudative age-related macular degeneration, right eye, with active choroidal neovascularization: Secondary | ICD-10-CM | POA: Diagnosis not present

## 2016-01-07 DIAGNOSIS — Z6831 Body mass index (BMI) 31.0-31.9, adult: Secondary | ICD-10-CM | POA: Diagnosis not present

## 2016-01-07 DIAGNOSIS — Z79899 Other long term (current) drug therapy: Secondary | ICD-10-CM | POA: Diagnosis not present

## 2016-01-07 DIAGNOSIS — D500 Iron deficiency anemia secondary to blood loss (chronic): Secondary | ICD-10-CM | POA: Diagnosis not present

## 2016-01-07 DIAGNOSIS — D509 Iron deficiency anemia, unspecified: Secondary | ICD-10-CM | POA: Diagnosis not present

## 2016-01-16 DIAGNOSIS — H353122 Nonexudative age-related macular degeneration, left eye, intermediate dry stage: Secondary | ICD-10-CM | POA: Diagnosis not present

## 2016-01-20 DIAGNOSIS — R1032 Left lower quadrant pain: Secondary | ICD-10-CM | POA: Diagnosis not present

## 2016-01-20 DIAGNOSIS — K5900 Constipation, unspecified: Secondary | ICD-10-CM | POA: Diagnosis not present

## 2016-01-20 DIAGNOSIS — K5909 Other constipation: Secondary | ICD-10-CM | POA: Diagnosis not present

## 2016-01-20 DIAGNOSIS — R1031 Right lower quadrant pain: Secondary | ICD-10-CM | POA: Diagnosis not present

## 2016-01-20 DIAGNOSIS — K625 Hemorrhage of anus and rectum: Secondary | ICD-10-CM | POA: Diagnosis not present

## 2016-02-05 ENCOUNTER — Other Ambulatory Visit: Payer: Self-pay | Admitting: Internal Medicine

## 2016-02-12 DIAGNOSIS — H353122 Nonexudative age-related macular degeneration, left eye, intermediate dry stage: Secondary | ICD-10-CM | POA: Diagnosis not present

## 2016-02-13 DIAGNOSIS — H353112 Nonexudative age-related macular degeneration, right eye, intermediate dry stage: Secondary | ICD-10-CM | POA: Diagnosis not present

## 2016-02-24 ENCOUNTER — Encounter: Payer: Self-pay | Admitting: Podiatry

## 2016-02-24 ENCOUNTER — Ambulatory Visit (INDEPENDENT_AMBULATORY_CARE_PROVIDER_SITE_OTHER): Payer: Medicare Other | Admitting: Podiatry

## 2016-02-24 DIAGNOSIS — B351 Tinea unguium: Secondary | ICD-10-CM

## 2016-02-24 DIAGNOSIS — M79676 Pain in unspecified toe(s): Secondary | ICD-10-CM

## 2016-02-24 DIAGNOSIS — Q828 Other specified congenital malformations of skin: Secondary | ICD-10-CM | POA: Diagnosis not present

## 2016-02-24 NOTE — Progress Notes (Signed)
She presents today with chief complaint of painful elongated toenails and corns and calluses.  Objective: Vital signs are stable alert and oriented 3. Pulses are palpable. Neurologic symptoms intact. Toenails are thick yellow dystrophic with mycotic and painful palpation. Multiple porokeratosis tyloma sit plantar aspect bilateral foot.  Assessment: Pain in limb secondary to onychomycosis diabetes and porokeratosis.  Plan: Debridement of toenails and reactive hyperkeratosis bilateral.

## 2016-03-09 ENCOUNTER — Other Ambulatory Visit (INDEPENDENT_AMBULATORY_CARE_PROVIDER_SITE_OTHER): Payer: Medicare Other

## 2016-03-09 DIAGNOSIS — E1140 Type 2 diabetes mellitus with diabetic neuropathy, unspecified: Secondary | ICD-10-CM | POA: Diagnosis not present

## 2016-03-09 DIAGNOSIS — E7800 Pure hypercholesterolemia, unspecified: Secondary | ICD-10-CM

## 2016-03-09 LAB — MICROALBUMIN / CREATININE URINE RATIO
Creatinine,U: 105.7 mg/dL
Microalb Creat Ratio: 61.9 mg/g — ABNORMAL HIGH (ref 0.0–30.0)
Microalb, Ur: 65.4 mg/dL — ABNORMAL HIGH (ref 0.0–1.9)

## 2016-03-09 LAB — LIPID PANEL
Cholesterol: 211 mg/dL — ABNORMAL HIGH (ref 0–200)
HDL: 58.4 mg/dL (ref >39.00)
LDL Cholesterol: 127 mg/dL — ABNORMAL HIGH (ref 0–99)
NonHDL: 152.95
Total CHOL/HDL Ratio: 4
Triglycerides: 129 mg/dL (ref 0.0–149.0)
VLDL: 25.8 mg/dL (ref 0.0–40.0)

## 2016-03-09 LAB — HEPATIC FUNCTION PANEL
ALT: 23 U/L (ref 0–35)
AST: 22 U/L (ref 0–37)
Albumin: 4 g/dL (ref 3.5–5.2)
Alkaline Phosphatase: 50 U/L (ref 39–117)
Bilirubin, Direct: 0.1 mg/dL (ref 0.0–0.3)
Total Bilirubin: 0.4 mg/dL (ref 0.2–1.2)
Total Protein: 7.7 g/dL (ref 6.0–8.3)

## 2016-03-09 LAB — BASIC METABOLIC PANEL
BUN: 13 mg/dL (ref 6–23)
CO2: 23 mEq/L (ref 19–32)
Calcium: 10.4 mg/dL (ref 8.4–10.5)
Chloride: 102 mEq/L (ref 96–112)
Creatinine, Ser: 0.71 mg/dL (ref 0.40–1.20)
GFR: 83.29 mL/min (ref >60.00)
Glucose, Bld: 293 mg/dL — ABNORMAL HIGH (ref 70–99)
Potassium: 4.5 mEq/L (ref 3.5–5.1)
Sodium: 136 mEq/L (ref 135–145)

## 2016-03-09 LAB — HEMOGLOBIN A1C: Hgb A1c MFr Bld: 10.2 % — ABNORMAL HIGH (ref 4.6–6.5)

## 2016-03-09 LAB — TSH: TSH: 3.22 u[IU]/mL (ref 0.35–4.50)

## 2016-03-10 ENCOUNTER — Ambulatory Visit (INDEPENDENT_AMBULATORY_CARE_PROVIDER_SITE_OTHER): Payer: Medicare Other | Admitting: Internal Medicine

## 2016-03-10 ENCOUNTER — Encounter: Payer: Self-pay | Admitting: Internal Medicine

## 2016-03-10 VITALS — BP 130/78 | HR 86 | Temp 98.1°F | Resp 18 | Ht 62.5 in | Wt 173.1 lb

## 2016-03-10 DIAGNOSIS — E1140 Type 2 diabetes mellitus with diabetic neuropathy, unspecified: Secondary | ICD-10-CM

## 2016-03-10 DIAGNOSIS — N83202 Unspecified ovarian cyst, left side: Secondary | ICD-10-CM

## 2016-03-10 DIAGNOSIS — N390 Urinary tract infection, site not specified: Secondary | ICD-10-CM

## 2016-03-10 DIAGNOSIS — R300 Dysuria: Secondary | ICD-10-CM

## 2016-03-10 DIAGNOSIS — D509 Iron deficiency anemia, unspecified: Secondary | ICD-10-CM

## 2016-03-10 DIAGNOSIS — R1084 Generalized abdominal pain: Secondary | ICD-10-CM

## 2016-03-10 DIAGNOSIS — I2510 Atherosclerotic heart disease of native coronary artery without angina pectoris: Secondary | ICD-10-CM | POA: Diagnosis not present

## 2016-03-10 DIAGNOSIS — I4891 Unspecified atrial fibrillation: Secondary | ICD-10-CM

## 2016-03-10 DIAGNOSIS — Z860100 Personal history of colon polyps, unspecified: Secondary | ICD-10-CM

## 2016-03-10 DIAGNOSIS — E7800 Pure hypercholesterolemia, unspecified: Secondary | ICD-10-CM

## 2016-03-10 DIAGNOSIS — N83201 Unspecified ovarian cyst, right side: Secondary | ICD-10-CM | POA: Diagnosis not present

## 2016-03-10 DIAGNOSIS — R197 Diarrhea, unspecified: Secondary | ICD-10-CM

## 2016-03-10 DIAGNOSIS — E1142 Type 2 diabetes mellitus with diabetic polyneuropathy: Secondary | ICD-10-CM

## 2016-03-10 MED ORDER — METFORMIN HCL ER 500 MG PO TB24: ORAL_TABLET | ORAL | Status: DC

## 2016-03-10 NOTE — Progress Notes (Signed)
Pre-visit discussion using our clinic review tool. No additional management support is needed unless otherwise documented below in the visit note.  

## 2016-03-10 NOTE — Progress Notes (Signed)
Patient ID: Holly Rose, female   DOB: 20-Oct-1929, 82 y.o.   MRN: [id number]   Subjective:    Patient ID: Holly Rose, female    DOB: August 26, 1930, 82 y.o.   MRN: [id number]  HPI  Patient here for a scheduled follow up.  She is accompanied by her daughter-n-law.  History obtained from both of them.  Overall she feels she is doing better.  Still with some intermittent bowel flares - abdominal pain, constipation.  S/p colonoscopy 09/12/15 - 6 small polyps in the transverse colon, ascending colon and the cecum, a few colonic angioectasias, diverticulosis.  Pathology revealed tubular adenomas - no repeat colon necessary.   They recommended her adjust her diet.  She has been watching her diet and increasing her fiber.  Still with flares.  Eating.  Has been eating out a lot.  Increased carb intake.  Checking sugars - elevated.  See attached list.  Taking her meds regularly.  Now with diarrhea - with mucus.  Intermittent.     Past Medical History  Diagnosis Date  . Diabetes ([place])   . Atrial fibrillation ([place])   . Hyperlipidemia   . Anemia   . Neuropathy ([place])   . TIA (transient ischemic attack)   . Arthritis   . Allergy   . Heart murmur   . Stroke ([place])   . CAD (coronary artery disease)   . History of sick sinus syndrome     s/p pacemaker placement  . Neuropathy ([place])   . Macular degeneration   . Cancer [facility])     H/O skin cancer, squamous   Past Surgical History  Procedure Laterality Date  . Psychologist, forensic    . Breast surgery    . Appendectomy    . Abdominal hysterectomy    . Tonsillectomy and adenoidectomy    . Bladder surgery      1975 and 1995  . Rectocele repair  1996  . Cataract extraction  1997 and 1998  . Cardiac catheterization  1989  . Colonoscopy with propofol N/A 09/12/2015    Procedure: COLONOSCOPY WITH PROPOFOL;  Surgeon: [name], MD;  Location: [facility] ENDOSCOPY;  Service: Endoscopy;  Laterality: N/A;   Family History  Problem Relation Age of Onset  .  Stroke Mother   . Diabetes Mother   . Cancer Maternal Aunt     Breast Cancer  . Arthritis Maternal Grandmother   . Cancer Maternal Aunt     Lung Cancer   Social History   Social History  . Marital Status: Single    Spouse Name: N/A  . Number of Children: N/A  . Years of Education: N/A   Social History Main Topics  . Smoking status: Never Smoker   . Smokeless tobacco: Never Used  . Alcohol Use: No  . Drug Use: No  . Sexual Activity: Not Asked   Other Topics Concern  . None   Social History Narrative    Outpatient Encounter Prescriptions as of 03/10/2016  Medication Sig  . aspirin 81 MG tablet Take 81 mg by mouth daily.  . blood glucose meter kit and supplies Per patient please dispense One touch Ultra2 meter.Use meter and supplies three times a day to check blood sugar. ICD10: E11.40  . digoxin (LANOXIN) 0.125 MG tablet   . furosemide (LASIX) 20 MG tablet Take 20 mg by mouth.  Marland Kitchen glucose blood test strip Use three times a day to check blood sugar. Please dispense One Touch Ultra 2 strips to  go with meter. ICD-10:E11.40  . metoprolol (LOPRESSOR) 50 MG tablet Take 50 mg by mouth 2 (two) times daily.  . Multiple Vitamins-Minerals (MULTIVITAMIN PO) Take by mouth daily.  . Multiple Vitamins-Minerals (PRESERVISION AREDS 2 PO) Take by mouth.  . Probiotic Product (PROBIOTIC PO) Take by mouth daily.  . sitaGLIPtin (JANUVIA) 100 MG tablet Take 1 tablet (100 mg total) by mouth daily.  . vitamin B-12 (CYANOCOBALAMIN) 1000 MCG tablet Take 1,000 mcg by mouth daily.  . [DISCONTINUED] glimepiride (AMARYL) 4 MG tablet   . [DISCONTINUED] metFORMIN (GLUCOPHAGE) 500 MG tablet TAKE ONE TABLET BY MOUTH TWICE DAILY WITH FOOD  . [DISCONTINUED] metoprolol (LOPRESSOR) 50 MG tablet   . [DISCONTINUED] metoprolol succinate (TOPROL-XL) 50 MG 24 hr tablet Take 50 mg by mouth 2 (two) times daily. Take with or immediately following a meal.  . metFORMIN (GLUCOPHAGE XR) 500 MG 24 hr tablet Take two tablets  bid   No facility-administered encounter medications on file as of 03/10/2016.    Review of Systems  Constitutional: Negative for appetite change and unexpected weight change.  HENT: Negative for congestion and sinus pressure.   Respiratory: Negative for cough, chest tightness and shortness of breath.   Cardiovascular: Negative for chest pain, palpitations and leg swelling.  Gastrointestinal: Positive for abdominal pain, diarrhea and constipation. Negative for nausea and vomiting.  Genitourinary: Positive for dysuria. Negative for difficulty urinating.  Musculoskeletal: Negative for joint swelling.       Some pain in her low back.  Some burning in her hands and feet.  Worse at times.  Discussed medication.    Neurological: Negative for dizziness, light-headedness and headaches.  Psychiatric/Behavioral: Negative for dysphoric mood and agitation.       Objective:     Blood pressure rechecked by me:  128-130/78  Physical Exam  Constitutional: She appears well-developed and well-nourished. No distress.  HENT:  Nose: Nose normal.  Mouth/Throat: Oropharynx is clear and moist.  Neck: Neck supple. No thyromegaly present.  Cardiovascular: Normal rate and regular rhythm.   Pulmonary/Chest: Breath sounds normal. No respiratory distress. She has no wheezes.  Abdominal: Soft. Bowel sounds are normal.  Minimal tenderness to palpation - lower abdomen.    Musculoskeletal: She exhibits no edema or tenderness.  Lymphadenopathy:    She has no cervical adenopathy.  Skin: No rash noted. No erythema.  Psychiatric: She has a normal mood and affect. Her behavior is normal.    BP 130/78 mmHg  Pulse 86  Temp(Src) 98.1 F (36.7 C) (Oral)  Resp 18  Ht 5' 2.5" (1.588 m)  Wt 173 lb 2 oz (78.529 kg)  BMI 31.14 kg/m2  SpO2 95% Wt Readings from Last 3 Encounters:  03/10/16 173 lb 2 oz (78.529 kg)  12/24/15 175 lb 8 oz (79.606 kg)  10/14/15 174 lb 4 oz (79.039 kg)     Lab Results  Component  Value Date   WBC 6.3 10/01/2015   HGB 13.9 10/01/2015   HCT 44 10/01/2015   PLT 182 10/01/2015   GLUCOSE 293* 03/09/2016   CHOL 211* 03/09/2016   TRIG 129.0 03/09/2016   HDL 58.40 03/09/2016   LDLCALC 127* 03/09/2016   ALT 23 03/09/2016   AST 22 03/09/2016   NA 136 03/09/2016   K 4.5 03/09/2016   CL 102 03/09/2016   CREATININE 0.71 03/09/2016   BUN 13 03/09/2016   CO2 23 03/09/2016   TSH 3.22 03/09/2016   HGBA1C 10.2* 03/09/2016   MICROALBUR 65.4* 03/09/2016    US  Transvaginal Non-ob  08/28/2015  CLINICAL DATA:  Adnexal cyst. EXAM: TRANSABDOMINAL AND TRANSVAGINAL ULTRASOUND OF PELVIS TECHNIQUE: Both transabdominal and transvaginal ultrasound examinations of the pelvis were performed. Transabdominal technique was performed for global imaging of the pelvis including uterus, ovaries, adnexal regions, and pelvic cul-de-sac. It was necessary to proceed with endovaginal exam following the transabdominal exam to visualize the uterus and ovaries. COMPARISON:  None FINDINGS: Uterus Hysterectomy. Right ovary Not well visualized. Hypoechoic region in the right adnexa is noted with a at 2.7 x 2.8 x 2.8 cm cyst. This is most likely the right ovary with a right ovarian small simple cyst. Left ovary Not visualized. Other findings No free fluid.  Difficult exam due to overlying bowel gas. IMPRESSION: Prior hysterectomy. Difficult exam due to overlying bowel gas. Probable 2.8 cm simple cyst right ovary. This is almost certainly benign, but follow up ultrasound is recommended in 1 year according to the Society of Radiologists in [place] Statement (D Clovis Riley et al. Management of Asymptomatic Ovarian and Other Adnexal Cysts Imaged at [place]: Society of Radiologists in [place] Statement 2010. Radiology 256 (Sept 2010): 517-616.). Electronically Signed   By: Marcello Moores  Register   On: 08/28/2015 11:34   US Pelvis Complete  08/28/2015  CLINICAL DATA:  Adnexal cyst.  EXAM: TRANSABDOMINAL AND TRANSVAGINAL ULTRASOUND OF PELVIS TECHNIQUE: Both transabdominal and transvaginal ultrasound examinations of the pelvis were performed. Transabdominal technique was performed for global imaging of the pelvis including uterus, ovaries, adnexal regions, and pelvic cul-de-sac. It was necessary to proceed with endovaginal exam following the transabdominal exam to visualize the uterus and ovaries. COMPARISON:  None FINDINGS: Uterus Hysterectomy. Right ovary Not well visualized. Hypoechoic region in the right adnexa is noted with a at 2.7 x 2.8 x 2.8 cm cyst. This is most likely the right ovary with a right ovarian small simple cyst. Left ovary Not visualized. Other findings No free fluid.  Difficult exam due to overlying bowel gas. IMPRESSION: Prior hysterectomy. Difficult exam due to overlying bowel gas. Probable 2.8 cm simple cyst right ovary. This is almost certainly benign, but follow up ultrasound is recommended in 1 year according to the Society of Radiologists in [place] Statement (D Clovis Riley et al. Management of Asymptomatic Ovarian and Other Adnexal Cysts Imaged at [place]: Society of Radiologists in [place] Statement 2010. Radiology 256 (Sept 2010): 073-710.). Electronically Signed   By: Marcello Moores  Register   On: 08/28/2015 11:34       Assessment & Plan:   Problem List Items Addressed This Visit    Abdominal pain    Had CT scan and then subsequent pelvic ultrasound.  Ultrasound revealed right ovarian cyst.  Discussed with her today.  She agreed to f/u pelvic ultrasound.        Anemia, iron deficiency    Followed by Dr Gaylyn Cheers.  Has been stable.  Colonoscopy as outlined.       Atrial fibrillation ([place])    Followed by Dr Ubaldo Glassing.  Stable.  Pacemaker in place.       Relevant Medications   metoprolol (LOPRESSOR) 50 MG tablet   CAD (coronary artery disease)    Has been followed by cardiology.        Relevant Medications   metoprolol  (LOPRESSOR) 50 MG tablet   Diabetes mellitus with neuropathy ([place]) - Primary    Sugars increased as outlined.  Have been unable to increase metformin secondary to intolerance.  We discussed multiple options.  Discussed additional  medication.  Will change metformin to extended release form.  Increase to 1031m bid.  Follow renal function.  Continue j[place]onga  Follow sugars.  Send in readings over the next few weeks.        Relevant Medications   metFORMIN (GLUCOPHAGE XR) 500 MG 24 hr tablet   Diabetic neuropathy ([place]    Discussed treatment.  Follow.  She desires to hold on medication.       Relevant Medications   metFORMIN (GLUCOPHAGE XR) 500 MG 24 hr tablet   Other Relevant Orders   Basic metabolic panel   Diarrhea    Continue probiotic.  Diarrhea better.  Follow.  Monitor diet intake.        History of colonic polyps    Colonoscopy 09/12/15 - as outlined.        Hypercholesterolemia    Low cholesterol diet and exercise.  Follow lipid panel.       Relevant Medications   metoprolol (LOPRESSOR) 50 MG tablet    Other Visit Diagnoses    Urinary tract infection, site not specified        Relevant Orders    Urine culture (Completed)    Urinalysis, Routine w reflex microscopic (Completed)    Cyst of left ovary        Right ovarian cyst        Relevant Orders    [place]Pelvis Complete (Completed)    [place]oreaTransvaginal Non-OB (Completed)    Dysuria        check u/a and culture.          SEinar Pheasant MD

## 2016-03-11 DIAGNOSIS — H353231 Exudative age-related macular degeneration, bilateral, with active choroidal neovascularization: Secondary | ICD-10-CM | POA: Diagnosis not present

## 2016-03-11 LAB — URINALYSIS, ROUTINE W REFLEX MICROSCOPIC
Bilirubin Urine: NEGATIVE
Ketones, ur: NEGATIVE
Nitrite: POSITIVE — AB
Specific Gravity, Urine: 1.025 (ref 1.000–1.030)
Total Protein, Urine: 30 — AB
Urine Glucose: >=1000 — AB
Urobilinogen, UA: 0.2 (ref 0.0–1.0)
pH: 5.5 (ref 5.0–8.0)

## 2016-03-13 ENCOUNTER — Other Ambulatory Visit: Payer: Self-pay | Admitting: Internal Medicine

## 2016-03-13 ENCOUNTER — Ambulatory Visit: Payer: BLUE CROSS/BLUE SHIELD | Admitting: Internal Medicine

## 2016-03-13 ENCOUNTER — Ambulatory Visit
Admission: RE | Admit: 2016-03-13 | Discharge: 2016-03-13 | Disposition: A | Discharge status: Home / Self Care | Payer: Medicare Other | Source: Ambulatory Visit | Attending: Internal Medicine | Admitting: Internal Medicine

## 2016-03-13 DIAGNOSIS — N83201 Unspecified ovarian cyst, right side: Secondary | ICD-10-CM | POA: Insufficient documentation

## 2016-03-13 LAB — URINE CULTURE: Colony Count: >=100000

## 2016-03-13 MED ORDER — CEFDINIR 300 MG PO CAPS: 300.0000 mg | ORAL_CAPSULE | Freq: Two times a day (BID) | ORAL | Status: DC

## 2016-03-13 NOTE — Progress Notes (Signed)
rx sent in for omnicef 

## 2016-03-15 ENCOUNTER — Encounter: Payer: Self-pay | Admitting: Internal Medicine

## 2016-03-15 DIAGNOSIS — R109 Unspecified abdominal pain: Secondary | ICD-10-CM

## 2016-03-15 DIAGNOSIS — R102 Pelvic and perineal pain: Secondary | ICD-10-CM

## 2016-03-15 DIAGNOSIS — N949 Unspecified condition associated with female genital organs and menstrual cycle: Secondary | ICD-10-CM

## 2016-03-15 NOTE — Assessment & Plan Note (Signed)
Low cholesterol diet and exercise.  Follow lipid panel.   

## 2016-03-15 NOTE — Assessment & Plan Note (Signed)
Colonoscopy 09/12/15 - as outlined.

## 2016-03-15 NOTE — Assessment & Plan Note (Signed)
Discussed treatment.  Follow.  She desires to hold on medication.

## 2016-03-15 NOTE — Assessment & Plan Note (Signed)
Had CT scan and then subsequent pelvic ultrasound.  Ultrasound revealed right ovarian cyst.  Discussed with her today.  She agreed to f/u pelvic ultrasound.

## 2016-03-15 NOTE — Assessment & Plan Note (Signed)
Sugars increased as outlined.  Have been unable to increase metformin secondary to intolerance.  We discussed multiple options.  Discussed additional medication.  Will change metformin to extended release form.  Increase to 1000mg  bid.  Follow renal function.  Continue [place].  Follow sugars.  Send in readings over the next few weeks.

## 2016-03-15 NOTE — Assessment & Plan Note (Signed)
Followed by Dr Gaylyn Cheers.  Has been stable.  Colonoscopy as outlined.

## 2016-03-15 NOTE — Assessment & Plan Note (Signed)
Has been followed by cardiology.  

## 2016-03-15 NOTE — Assessment & Plan Note (Signed)
Followed by Dr Ubaldo Glassing.  Stable.  Pacemaker in place.

## 2016-03-15 NOTE — Assessment & Plan Note (Signed)
Continue probiotic.  Diarrhea better.  Follow.  Monitor diet intake.

## 2016-03-16 NOTE — Telephone Encounter (Signed)
Order placed for CT scan. Pt advised about stopping metformin.

## 2016-03-18 NOTE — Telephone Encounter (Signed)
I mailed this info along with the 7/3 unread message

## 2016-03-25 DIAGNOSIS — H353231 Exudative age-related macular degeneration, bilateral, with active choroidal neovascularization: Secondary | ICD-10-CM | POA: Diagnosis not present

## 2016-03-26 DIAGNOSIS — I4891 Unspecified atrial fibrillation: Secondary | ICD-10-CM | POA: Diagnosis not present

## 2016-03-27 ENCOUNTER — Ambulatory Visit
Admission: RE | Admit: 2016-03-27 | Discharge: 2016-03-27 | Disposition: A | Discharge status: Home / Self Care | Payer: Medicare Other | Source: Ambulatory Visit | Attending: Internal Medicine | Admitting: Internal Medicine

## 2016-03-27 DIAGNOSIS — N200 Calculus of kidney: Secondary | ICD-10-CM | POA: Insufficient documentation

## 2016-03-27 DIAGNOSIS — K5790 Diverticulosis of intestine, part unspecified, without perforation or abscess without bleeding: Secondary | ICD-10-CM | POA: Diagnosis not present

## 2016-03-27 DIAGNOSIS — K7689 Other specified diseases of liver: Secondary | ICD-10-CM | POA: Diagnosis not present

## 2016-03-27 DIAGNOSIS — N281 Cyst of kidney, acquired: Secondary | ICD-10-CM | POA: Diagnosis not present

## 2016-03-27 DIAGNOSIS — N83201 Unspecified ovarian cyst, right side: Secondary | ICD-10-CM | POA: Insufficient documentation

## 2016-03-27 DIAGNOSIS — R109 Unspecified abdominal pain: Secondary | ICD-10-CM | POA: Insufficient documentation

## 2016-03-27 DIAGNOSIS — K760 Fatty (change of) liver, not elsewhere classified: Secondary | ICD-10-CM | POA: Diagnosis not present

## 2016-03-27 DIAGNOSIS — N949 Unspecified condition associated with female genital organs and menstrual cycle: Secondary | ICD-10-CM | POA: Diagnosis not present

## 2016-03-27 DIAGNOSIS — R102 Pelvic and perineal pain: Secondary | ICD-10-CM | POA: Insufficient documentation

## 2016-03-27 DIAGNOSIS — R1030 Lower abdominal pain, unspecified: Secondary | ICD-10-CM | POA: Diagnosis not present

## 2016-03-27 MED ORDER — IOPAMIDOL (ISOVUE-300) INJECTION 61%
100.0000 mL | Freq: Once | INTRAVENOUS | Status: AC | PRN
  Administered 2016-03-27: 100 mL via INTRAVENOUS

## 2016-03-30 ENCOUNTER — Other Ambulatory Visit: Payer: Self-pay | Admitting: Internal Medicine

## 2016-03-30 DIAGNOSIS — N83209 Unspecified ovarian cyst, unspecified side: Secondary | ICD-10-CM

## 2016-03-30 NOTE — Progress Notes (Signed)
Order placed for gyn referral.  

## 2016-04-09 DIAGNOSIS — H353231 Exudative age-related macular degeneration, bilateral, with active choroidal neovascularization: Secondary | ICD-10-CM | POA: Diagnosis not present

## 2016-04-12 ENCOUNTER — Encounter: Payer: Self-pay | Admitting: Internal Medicine

## 2016-04-14 NOTE — Telephone Encounter (Signed)
I had given you a message on this pt yesterday.  Dr Leonides Schanz leaving.  We discussed scheduling an appt with Dr Leafy Ro. Apparently pt called and scheduled.  Can you see if we need a referral and make sure insurance covers.. Thanks

## 2016-04-15 DIAGNOSIS — R001 Bradycardia, unspecified: Secondary | ICD-10-CM | POA: Diagnosis not present

## 2016-04-15 DIAGNOSIS — I341 Nonrheumatic mitral (valve) prolapse: Secondary | ICD-10-CM | POA: Diagnosis not present

## 2016-04-15 DIAGNOSIS — I4891 Unspecified atrial fibrillation: Secondary | ICD-10-CM | POA: Diagnosis not present

## 2016-04-15 DIAGNOSIS — E782 Mixed hyperlipidemia: Secondary | ICD-10-CM | POA: Diagnosis not present

## 2016-04-21 DIAGNOSIS — Z79899 Other long term (current) drug therapy: Secondary | ICD-10-CM | POA: Diagnosis not present

## 2016-04-21 DIAGNOSIS — Z6831 Body mass index (BMI) 31.0-31.9, adult: Secondary | ICD-10-CM | POA: Diagnosis not present

## 2016-04-21 DIAGNOSIS — D500 Iron deficiency anemia secondary to blood loss (chronic): Secondary | ICD-10-CM | POA: Diagnosis not present

## 2016-05-07 DIAGNOSIS — H353231 Exudative age-related macular degeneration, bilateral, with active choroidal neovascularization: Secondary | ICD-10-CM | POA: Diagnosis not present

## 2016-05-11 DIAGNOSIS — R300 Dysuria: Secondary | ICD-10-CM | POA: Diagnosis not present

## 2016-05-11 DIAGNOSIS — N390 Urinary tract infection, site not specified: Secondary | ICD-10-CM | POA: Diagnosis not present

## 2016-05-11 DIAGNOSIS — N200 Calculus of kidney: Secondary | ICD-10-CM | POA: Diagnosis not present

## 2016-05-11 DIAGNOSIS — N83201 Unspecified ovarian cyst, right side: Secondary | ICD-10-CM | POA: Diagnosis not present

## 2016-05-14 DIAGNOSIS — H353231 Exudative age-related macular degeneration, bilateral, with active choroidal neovascularization: Secondary | ICD-10-CM | POA: Diagnosis not present

## 2016-05-19 ENCOUNTER — Encounter: Payer: Self-pay | Admitting: Internal Medicine

## 2016-05-19 NOTE — Telephone Encounter (Signed)
I can see her on Tuesday 05/26/16 at 12:00.  Please call her daughter-n-law Cheryll Dessert) with appt time. Thanks.

## 2016-05-20 NOTE — Telephone Encounter (Signed)
I can see her at 8:00 or 8:30 on 06/03/16.  Let me know if problem.

## 2016-05-26 ENCOUNTER — Encounter: Payer: Self-pay | Admitting: Podiatry

## 2016-05-26 ENCOUNTER — Ambulatory Visit (INDEPENDENT_AMBULATORY_CARE_PROVIDER_SITE_OTHER): Payer: Medicare Other | Admitting: Podiatry

## 2016-05-26 DIAGNOSIS — Q828 Other specified congenital malformations of skin: Secondary | ICD-10-CM

## 2016-05-26 DIAGNOSIS — E1142 Type 2 diabetes mellitus with diabetic polyneuropathy: Secondary | ICD-10-CM | POA: Diagnosis not present

## 2016-05-26 DIAGNOSIS — M79676 Pain in unspecified toe(s): Secondary | ICD-10-CM | POA: Diagnosis not present

## 2016-05-26 DIAGNOSIS — B351 Tinea unguium: Secondary | ICD-10-CM | POA: Diagnosis not present

## 2016-05-26 NOTE — Patient Instructions (Signed)
Diabetes and Foot Care Diabetes may cause you to have problems because of poor blood supply (circulation) to your feet and legs. This may cause the skin on your feet to become thinner, break easier, and heal more slowly. Your skin may become dry, and the skin may peel and crack. You may also have nerve damage in your legs and feet causing decreased feeling in them. You may not notice minor injuries to your feet that could lead to infections or more serious problems. Taking care of your feet is one of the most important things you can do for yourself.  HOME CARE INSTRUCTIONS  Wear shoes at all times, even in the house. Do not go barefoot. Bare feet are easily injured.  Check your feet daily for blisters, cuts, and redness. If you cannot see the bottom of your feet, use a mirror or ask someone for help.  Wash your feet with warm water (do not use hot water) and mild soap. Then pat your feet and the areas between your toes until they are completely dry. Do not soak your feet as this can dry your skin.  Apply a moisturizing lotion or petroleum jelly (that does not contain alcohol and is unscented) to the skin on your feet and to dry, brittle toenails. Do not apply lotion between your toes.  Trim your toenails straight across. Do not dig under them or around the cuticle. File the edges of your nails with an emery board or nail file.  Do not cut corns or calluses or try to remove them with medicine.  Wear clean socks or stockings every day. Make sure they are not too tight. Do not wear knee-high stockings since they may decrease blood flow to your legs.  Wear shoes that fit properly and have enough cushioning. To break in new shoes, wear them for just a few hours a day. This prevents you from injuring your feet. Always look in your shoes before you put them on to be sure there are no objects inside.  Do not cross your legs. This may decrease the blood flow to your feet.  If you find a minor scrape,  cut, or break in the skin on your feet, keep it and the skin around it clean and dry. These areas may be cleansed with mild soap and water. Do not cleanse the area with peroxide, alcohol, or iodine.  When you remove an adhesive bandage, be sure not to damage the skin around it.  If you have a wound, look at it several times a day to make sure it is healing.  Do not use heating pads or hot water bottles. They may burn your skin. If you have lost feeling in your feet or legs, you may not know it is happening until it is too late.  Make sure your health care provider performs a complete foot exam at least annually or more often if you have foot problems. Report any cuts, sores, or bruises to your health care provider immediately. SEEK MEDICAL CARE IF:   You have an injury that is not healing.  You have cuts or breaks in the skin.  You have an ingrown nail.  You notice redness on your legs or feet.  You feel burning or tingling in your legs or feet.  You have pain or cramps in your legs and feet.  Your legs or feet are numb.  Your feet always feel cold. SEEK IMMEDIATE MEDICAL CARE IF:   There is increasing redness,   swelling, or pain in or around a wound.  There is a red line that goes up your leg.  Pus is coming from a wound.  You develop a fever or as directed by your health care provider.  You notice a bad smell coming from an ulcer or wound.   This information is not intended to replace advice given to you by your health care provider. Make sure you discuss any questions you have with your health care provider.   Document Released: 08/28/2000 Document Revised: 05/03/2013 Document Reviewed: 02/07/2013 Elsevier Interactive Patient Education 2016 [employer]  

## 2016-05-26 NOTE — Progress Notes (Signed)
SUBJECTIVE Patient with a history of diabetes mellitus presents to office today complaining of elongated, thickened nails. Pain while ambulating in shoes. Patient is unable to trim their own nails. Patient also complains of bilateral calluses to the bottom of her feet.  Allergies  Allergen Reactions  . Lyrica [Pregabalin] Other (See Comments) and Swelling    Other reaction(s): SWELLING/EDEMA Other reaction(s): SWELLING/EDEMA Sleepy, does not eat  . Neurontin [Gabapentin] Other (See Comments) and Nausea And Vomiting    Other reaction(s): Other (See Comments) Out of it Other reaction(s): Other (See Comments) Out of it Other reaction(s): OTHER Out of it    OBJECTIVE General Patient is awake, alert, and oriented x 3 and in no acute distress. Derm Skin is dry and supple bilateral. Negative open lesions or macerations. Remaining integument unremarkable. Nails are tender, long, thickened and dystrophic with subungual debris, consistent with onychomycosis, 1-5 bilateral. No signs of infection noted.Hyperkeratotic lesions noted to the sub-second metatarsal area of the bilateral feet. These are painful on palpation Vasc  DP and PT pedal pulses palpable bilaterally. Temperature gradient within normal limits.  Neuro Epicritic and protective threshold sensation diminished bilaterally.  Musculoskeletal Exam pain on palpation to the bilateral calluses previously noted to the sub-second metatarsal region bilateral No symptomatic pedal deformities noted bilateral. Muscular strength within normal limits.  ASSESSMENT 1. Diabetes Mellitus w/ peripheral neuropathy 2. Onychomycosis of nail due to dermatophyte bilateral 3. Pain in foot bilateral 4. Porokeratosis subsecond metatarsal bilateral feet  PLAN OF CARE Patient evaluated today. Instructed to maintain good pedal hygiene and foot care. Stressed importance of controlling blood sugar.  Mechanical debridement of nails 1-5 bilaterally performed using a  nail nipper. Filed with dremel without incident.  All patient questions were answered. Return to clinic in 3 mos.    Edrick Kins, DPM

## 2016-05-27 ENCOUNTER — Ambulatory Visit: Payer: Medicare Other | Admitting: Podiatry

## 2016-06-02 ENCOUNTER — Telehealth: Payer: Self-pay | Admitting: *Deleted

## 2016-06-02 ENCOUNTER — Ambulatory Visit (INDEPENDENT_AMBULATORY_CARE_PROVIDER_SITE_OTHER): Payer: Medicare Other | Admitting: Urology

## 2016-06-02 ENCOUNTER — Encounter: Payer: Self-pay | Admitting: Urology

## 2016-06-02 VITALS — BP 146/72 | HR 69 | Ht 62.5 in | Wt 166.4 lb

## 2016-06-02 DIAGNOSIS — I2510 Atherosclerotic heart disease of native coronary artery without angina pectoris: Secondary | ICD-10-CM

## 2016-06-02 DIAGNOSIS — N390 Urinary tract infection, site not specified: Secondary | ICD-10-CM | POA: Diagnosis not present

## 2016-06-02 DIAGNOSIS — N200 Calculus of kidney: Secondary | ICD-10-CM | POA: Diagnosis not present

## 2016-06-02 DIAGNOSIS — R32 Unspecified urinary incontinence: Secondary | ICD-10-CM | POA: Diagnosis not present

## 2016-06-02 DIAGNOSIS — N952 Postmenopausal atrophic vaginitis: Secondary | ICD-10-CM

## 2016-06-02 DIAGNOSIS — R109 Unspecified abdominal pain: Secondary | ICD-10-CM

## 2016-06-02 LAB — BLADDER SCAN AMB NON-IMAGING: Scan Result: 0

## 2016-06-02 MED ORDER — ESTROGENS, CONJUGATED 0.625 MG/GM VA CREA: 1.0000 | TOPICAL_CREAM | Freq: Every day | VAGINAL | 12 refills | Status: DC

## 2016-06-02 MED ORDER — ESTRADIOL 0.1 MG/GM VA CREA: TOPICAL_CREAM | VAGINAL | 12 refills | Status: DC

## 2016-06-02 NOTE — Telephone Encounter (Signed)
Spoke with patient about setting up her PTNS appointments. Patient wants to talk to daughter in law and call me back tomorrow. I let her know that would be fine.

## 2016-06-02 NOTE — Progress Notes (Signed)
06/02/2016 2:38 PM   Holly Rose 05-05-1930 [id number]  Referring provider: [name], MD [address] [place], [place] [address]  Chief Complaint  Patient presents with  . Recurrent UTI    referred by Holly Rose    HPI: Patient is a 82 -year-old Caucasian female who is referred to [place] by, Dr. [name],  for recurrent urinary tract infections.  Patient states that she has had two urinary tract infections over the last year.  Her symptoms with a urinary tract infection consist of dysuria, suprapubic pain and pain in the left flank.  She denies andy current dysuria, gross hematuria, suprapubic pain, back pain or abdominal pain.    She has been having left flank pain that is intermittent.  It can be intense at time reaching levels of 10/10.   The pain occurs while she is seated and she has to walk around, but she cannot get comfortable.  The pain lasts for several minutes.  She has not found anything that provides relief for the flank pain.    She has not had any recent fevers, chills, nausea or vomiting.  She does have a history of nephrolithiasis, and GU surgery.  She has had bladder tacking in 1975 and 1995.   She had rectocele repair and bladder reconstruction 1996.    Reviewing her records,  she has had two documented infections.  One for e.coli and one for proteus with pan sensitivities.    She is not sexually active.  She is post menopausal.  She admits to diarrhea.  She does engage in good perineal hygiene.  She does not take tub baths.   She has incontinence.  She is using incontinence pads. She wears two thick pads daily, sometimes wetting through the pads onto her clothes.    She underwent a CT with contrast in 03/2016 which demonstrated a reduction in size of the right ovarian cyst as described.  Nonobstructing left renal calculus increased in size from the prior exam.  Fatty liver.  Hepatic and renal cysts which are stable.  Diverticulosis  without diverticulitis.  She is drinking adequate amount of water daily.  Her PVR at today's exam was 0 mL.    PMH: Past Medical History:  Diagnosis Date  . Allergy   . Anemia   . Arthritis   . Atrial fibrillation ([place])   . CAD (coronary artery disease)   . Cancer ([place])    H/O skin cancer, squamous  . Diabetes ([place])   . Heart attack ([place])   . Heart murmur   . History of sick sinus syndrome    s/p pacemaker placement  . Hyperlipidemia   . Macular degeneration   . Neuropathy ([place])   . Neuropathy ([place])   . Stroke ([place])   . TIA (transient ischemic attack)     Surgical History: Past Surgical History:  Procedure Laterality Date  . ABDOMINAL HYSTERECTOMY    . APPENDECTOMY    . [place]  . BREAST SURGERY    . CARDIAC CATHETERIZATION  1989  . CATARACT EXTRACTION  1997 and 1998  . COLONOSCOPY WITH PROPOFOL N/A 09/12/2015   Procedure: COLONOSCOPY WITH PROPOFOL;  Surgeon: [name], MD;  Location: [facility] ENDOSCOPY;  Service: Endoscopy;  Laterality: N/A;  . pace maker    . [place]  . TONSILLECTOMY AND ADENOIDECTOMY      Home Medications:    Medication List  Accurate as of 06/02/16  2:38 PM. Always use your most recent med list.          Aflibercept 2 MG/0.05ML Soln   aspirin 81 MG tablet Take 81 mg by mouth daily.   blood glucose meter kit and supplies Per patient please dispense One touch Ultra2 meter.Use meter and supplies three times a day to check blood sugar. ICD10: E11.40   cefdinir 300 MG capsule Commonly known as:  OMNICEF Take 1 capsule (300 mg total) by mouth 2 (two) times daily.   conjugated estrogens vaginal cream Commonly known as:  PREMARIN Place 1 Applicatorful vaginally daily. Apply 0.'5mg'$  (pea-sized amount)  just inside the vaginal introitus with a finger-tip every night for two weeks and then Monday, Wednesday and Friday nights.   dapagliflozin propanediol 5 MG Tabs tablet Commonly known as:   FARXIGA Take 5 mg by mouth.   digoxin 0.125 MG tablet Commonly known as:  LANOXIN   estradiol 0.1 MG/GM vaginal cream Commonly known as:  ESTRACE VAGINAL Apply 0.'5mg'$  (pea-sized amount)  just inside the vaginal introitus with a finger-tip every night for two weeks and then Monday, Wednesday and Friday nights.   furosemide 20 MG tablet Commonly known as:  LASIX Take 20 mg by mouth.   glucose blood test strip Use three times a day to check blood sugar. Please dispense One Touch Ultra 2 strips to go with meter. ICD-10:E11.40   guaiFENesin 200 MG tablet Take 400 mg by mouth.   magnesium sulfate 50 % injection Take by mouth.   metFORMIN 500 MG 24 hr tablet Commonly known as:  GLUCOPHAGE XR Take two tablets bid   metoprolol 50 MG tablet Commonly known as:  LOPRESSOR Take 50 mg by mouth 2 (two) times daily.   PRESERVISION AREDS 2 PO Take by mouth.   PROBIOTIC ACIDOPHILUS PO Take by mouth.   PROBIOTIC PO Take by mouth daily.   sitaGLIPtin 100 MG tablet Commonly known as:  JANUVIA Take 1 tablet (100 mg total) by mouth daily.   sulfamethoxazole-trimethoprim 800-160 MG tablet Commonly known as:  BACTRIM DS,SEPTRA DS Take by mouth.   vitamin B-12 1000 MCG tablet Commonly known as:  CYANOCOBALAMIN Take 1,000 mcg by mouth daily.   VITAMIN D-1000 MAX ST 1000 units tablet Generic drug:  Cholecalciferol Take by mouth.       Allergies:  Allergies  Allergen Reactions  . Lyrica [Pregabalin] Other (See Comments) and Swelling    Other reaction(s): SWELLING/EDEMA Other reaction(s): SWELLING/EDEMA Sleepy, does not eat  . Neurontin [Gabapentin] Other (See Comments) and Nausea And Vomiting    Other reaction(s): Other (See Comments) Out of it Other reaction(s): Other (See Comments) Out of it Other reaction(s): OTHER Out of it  . Bactrim [Sulfamethoxazole-Trimethoprim]     Made her dizziness    Family History: Family History  Problem Relation Age of Onset  . Stroke  Mother   . Diabetes Mother   . Cancer Maternal Aunt     Breast Cancer  . Arthritis Maternal Grandmother   . Cancer Maternal Aunt     Lung Cancer  . Kidney disease Neg Hx   . Bladder Cancer Neg Hx     Social History:  reports that she has quit smoking. She has never used smokeless tobacco. She reports that she does not drink alcohol or use drugs.  ROS: UROLOGY Frequent Urination?: Yes Hard to postpone urination?: Yes Burning/pain with urination?: No Get up at night to urinate?: Yes Leakage of urine?: Yes Urine  stream starts and stops?: No Trouble starting stream?: No Do you have to strain to urinate?: No Blood in urine?: No Urinary tract infection?: Yes Sexually transmitted disease?: No Injury to kidneys or bladder?: No Painful intercourse?: No Weak stream?: No Currently pregnant?: No Vaginal bleeding?: No Last menstrual period?: n  Gastrointestinal Nausea?: No Vomiting?: No Indigestion/heartburn?: No Diarrhea?: Yes Constipation?: No  Constitutional Fever: No Night sweats?: No Weight loss?: No Fatigue?: No  Skin Skin rash/lesions?: No Itching?: No  Eyes Blurred vision?: No Double vision?: No  Ears/Nose/Throat Sore throat?: No Sinus problems?: No  Hematologic/Lymphatic Swollen glands?: No Easy bruising?: No  Cardiovascular Leg swelling?: Yes Chest pain?: No  Respiratory Cough?: No Shortness of breath?: No  Endocrine Excessive thirst?: No  Musculoskeletal Back pain?: Yes Joint pain?: Yes  Neurological Headaches?: No Dizziness?: No  Psychologic Depression?: No Anxiety?: No  Physical Exam: BP (!) 146/72   Pulse 69   Ht 5' 2.5" (1.588 m)   Wt 166 lb 6.4 oz (75.5 kg)   BMI 29.95 kg/m   Constitutional: Well nourished. Alert and oriented, No acute distress. HEENT: [place] AT, moist mucus membranes. Trachea midline, no masses. Cardiovascular: No clubbing, cyanosis, or edema. Respiratory: Normal respiratory effort, no increased work of  breathing. GI: Abdomen is soft, non tender, non distended, no abdominal masses. Liver and spleen not palpable.  No hernias appreciated.  Stool sample for occult testing is not indicated.   GU: No CVA tenderness.  No bladder fullness or masses.  Atrophic external genitalia, normal pubic hair distribution, no lesions.  Normal urethral meatus, no lesions, no prolapse, no discharge.   No urethral masses, tenderness and/or tenderness. No bladder fullness, tenderness or masses. Pale vaginal mucosa, poor estrogen effect, no discharge, no lesions, good pelvic support, no cystocele or rectocele noted.  No cervical motion tenderness.  Uterus is freely mobile and non-fixed.  No adnexal/parametria masses or tenderness noted.  Anus and perineum are without rashes or lesions.    Skin: No rashes, bruises or suspicious lesions. Lymph: No cervical or inguinal adenopathy. Neurologic: Grossly intact, no focal deficits, moving all 4 extremities. Psychiatric: Normal mood and affect.  Laboratory Data: Lab Results  Component Value Date   WBC 6.3 10/01/2015   HGB 13.9 10/01/2015   HCT 44 10/01/2015   MCV 88.0 08/13/2015   PLT 182 10/01/2015    Lab Results  Component Value Date   CREATININE 0.71 03/09/2016    Lab Results  Component Value Date   HGBA1C 10.2 (H) 03/09/2016    Lab Results  Component Value Date   TSH 3.22 03/09/2016       Component Value Date/Time   CHOL 211 (H) 03/09/2016 0923   HDL 58.40 03/09/2016 0923   CHOLHDL 4 03/09/2016 0923   VLDL 25.8 03/09/2016 0923   LDLCALC 127 (H) 03/09/2016 0923    Lab Results  Component Value Date   AST 22 03/09/2016   Lab Results  Component Value Date   ALT 23 03/09/2016     Pertinent Imaging: CLINICAL DATA:  Lower abdominal pain for several months  EXAM: CT ABDOMEN AND PELVIS WITH CONTRAST  TECHNIQUE: Multidetector CT imaging of the abdomen and pelvis was performed using the standard protocol following bolus administration  of intravenous contrast.  CONTRAST:  114m ISOVUE-300 IOPAMIDOL (ISOVUE-300) INJECTION 61%  COMPARISON:  08/15/2015  FINDINGS: Lower chest:  No acute findings.  Hepatobiliary: Fatty infiltration of the liver is noted. A hypodensity is noted in the dome of the liver stable from  the prior exam. The gallbladder is within normal limits.  Pancreas: No mass, inflammatory changes, or other significant abnormality.  Spleen: Within normal limits in size and appearance.  Adrenals/Urinary Tract: Adrenal glands are unremarkable. Kidneys demonstrate nonobstructing stone in the lower pole on the left measuring approximately 14 mm. This is slightly more prominent than that seen on the prior exam. Tiny renal cysts are again noted. The bladder is well distended.  Stomach/Bowel: The appendix has been surgically removed. Diverticular change of the colon is again identified. No diverticulitis is seen.  Vascular/Lymphatic: Aortoiliac calcifications are noted without aneurysmal dilatation. No significant lymphadenopathy is noted.  Reproductive: The uterus has been surgically removed. A small right ovarian cyst is again noted and decreased in size now measuring 1.9 cm. It previously measured 2.5 cm.  Other: None.  Musculoskeletal: Multilevel degenerative changes of the lumbar spine are seen.  IMPRESSION: Reduction in size of the right ovarian cyst as described.  Nonobstructing left renal calculus increased in size from the prior exam.  Fatty liver.  Hepatic and renal cysts which are stable.  Diverticulosis without diverticulitis.   Electronically Signed   By: Inez Catalina M.D.   On: 03/27/2016 10:35   Assessment & Plan:    1. Vaginal atrophy  - I explained to the patient that when women go through menopause and her estrogen levels are severely diminished, the normal vaginal flora will change.  This is due to an increase of the vaginal canal's pH. Because of  this, the vaginal canal may be colonized by bacteria from the rectum instead of the protective lactobacillus.  This accompanied by the loss of the mucus barrier with vaginal atrophy is a cause of recurrent urinary tract infections.  - In some studies, the use of vaginal estrogen cream has been demonstrated to reduce  recurrent urinary tract infections to one a year.   - Patient was given a sample of vaginal estrogen cream (Estrace) and instructed to apply 0.21m (pea-sized amount)  just inside the vaginal introitus with a finger-tip every night for two weeks and then Monday, Wednesday and Friday nights.  I explained to the patient that vaginally administered estrogen, which causes only a slight increase in the blood estrogen levels, have fewer contraindications and adverse systemic effects that oral HT.  - I have also given prescriptions for the Estrace cream and Premarin cream, so that the patient may carry them to the pharmacy to see which one of the branded creams would be most economical for her.  If she finds both medications cost prohibitive, she is instructed to call the office.  We can then call in a compounded vaginal estrogen cream for the patient that may be more affordable.    - She will follow up in three months for an exam.    2. Left renal stone  - 14 mm left lower pole stone  - serum creatinine is stable at 0.7 over the last 10 months  - not a surgical candidate at this time  - obtain a 24 hour work up to hopefully avoid further enlargement of the stone and prevent other stones from forming  - RTC to discuss results  - Advised to contact our office or seek treatment in the ED if becomes febrile or pain/ vomiting are difficult control in order to arrange for emergent/urgent intervention  3. Recurrent UTI's  - Patient is instructed to increase her water intake until the urine is pale yellow or clear.  I have advised her to  take probiotics (yogurt, oral pills or vaginal suppositories),  take cranberry pills or drink the juice and use the estrogen cream.  She is to take Vitamin C 1,000 mg daily to acidify the urine.   She should also avoid soaking in tubs and wipe front to back after urinating.    - Because of her history of recurrent UTI's, I have asked the patient to contact our office if she should experience symptoms of urinary tract infection so that we can CATH her for an urine specimen for urinalysis and culture. This is to prevent a skin contaminant from showing up in the urine culture.  If she should have her symptoms after hours or cannot get to our office, she should notify her other providers that she needs a catheterized specimen for UA and culture.   - I reviewed the symptoms of a urinary tract infection, such as a worsening of urinary urgency and frequency, dysuria, which is painful urination and not the pain of urine hitting sensitive perineal skin, hematuria, foul-smelling urine, suprapubic pain or mental status changes. Fevers, chills, nausea and or vomiting can also be signs of a possible UTI.  Positive urinalyses and positive urine cultures that are not associated with urinary symptoms should not be treated with antibiotics.    - I explained to the patient that being exposed to unnecessary antibiotics can put her at risk for increasing resistance of the bacteria to antibiotics, C. difficile and the side effects of the antibiotics.    4. Left flank pain  - not likely due the left renal stone as it is not obstructing at this time  5. Incontinence  - will reassess after a trial of estrogen cream                                  I spent 60 minutes in a face to face conversation regarding her recurrent urinary tract infections vaginal atrophy contributes to recurrent urinary tract infections. We also discussed her renal stone and that in theory is not causing obstruction and should not be causing pain. We also discussed undergoing 24-hour urine testing for further  evaluation as to why she a form stones.  Greater than 50% was spent in counseling & coordination of care with the patient.  Return in about 3 months (around 09/01/2016) for exam and [facility] results.  These notes generated with voice recognition software. I apologize for typographical errors.  Zara Council, [facility] [address] [place], [place] [address] [phone]

## 2016-06-02 NOTE — Patient Instructions (Addendum)
  I have given you two prescriptions for a vaginal estrogen cream.  Estrace and Premarin.  Please take these to your pharmacy and see which one your insurance covers.  If both are too expensive, please call the office at [phone] for an alternative.  You are given a sample of vaginal estrogen cream (Estrace) and instructed to apply 0.5mg  (pea-sized amount)  just inside the vaginal introitus with a finger-tip every night for two weeks.   Then apply it Monday, Wednesday and Friday nights.

## 2016-06-03 ENCOUNTER — Encounter: Payer: Self-pay | Admitting: Internal Medicine

## 2016-06-03 ENCOUNTER — Telehealth: Payer: Self-pay | Admitting: Urology

## 2016-06-03 ENCOUNTER — Ambulatory Visit (INDEPENDENT_AMBULATORY_CARE_PROVIDER_SITE_OTHER): Payer: Medicare Other | Admitting: Internal Medicine

## 2016-06-03 VITALS — BP 132/86 | HR 87 | Temp 97.7°F | Ht 63.0 in | Wt 166.0 lb

## 2016-06-03 DIAGNOSIS — E1142 Type 2 diabetes mellitus with diabetic polyneuropathy: Secondary | ICD-10-CM

## 2016-06-03 DIAGNOSIS — E7800 Pure hypercholesterolemia, unspecified: Secondary | ICD-10-CM | POA: Diagnosis not present

## 2016-06-03 DIAGNOSIS — Z23 Encounter for immunization: Secondary | ICD-10-CM | POA: Diagnosis not present

## 2016-06-03 DIAGNOSIS — F439 Reaction to severe stress, unspecified: Secondary | ICD-10-CM

## 2016-06-03 DIAGNOSIS — G629 Polyneuropathy, unspecified: Secondary | ICD-10-CM

## 2016-06-03 DIAGNOSIS — Z86010 Personal history of colonic polyps: Secondary | ICD-10-CM

## 2016-06-03 DIAGNOSIS — I2510 Atherosclerotic heart disease of native coronary artery without angina pectoris: Secondary | ICD-10-CM | POA: Diagnosis not present

## 2016-06-03 DIAGNOSIS — D509 Iron deficiency anemia, unspecified: Secondary | ICD-10-CM

## 2016-06-03 DIAGNOSIS — E1140 Type 2 diabetes mellitus with diabetic neuropathy, unspecified: Secondary | ICD-10-CM

## 2016-06-03 DIAGNOSIS — Z658 Other specified problems related to psychosocial circumstances: Secondary | ICD-10-CM | POA: Diagnosis not present

## 2016-06-03 DIAGNOSIS — I4891 Unspecified atrial fibrillation: Secondary | ICD-10-CM

## 2016-06-03 NOTE — Telephone Encounter (Signed)
Pt's daughter, [name] called to schedule PTNS treatments.  Please give her a call.

## 2016-06-03 NOTE — Patient Instructions (Signed)
Alpha lipoic acid daily (600mg )

## 2016-06-03 NOTE — Progress Notes (Signed)
Pre visit review using our clinic review tool, if applicable. No additional management support is needed unless otherwise documented below in the visit note. 

## 2016-06-03 NOTE — Progress Notes (Signed)
Patient ID: Holly Rose, female   DOB: 27-Sep-1929, 82 y.o.   MRN: [id number]   Subjective:    Patient ID: Holly Rose, female    DOB: Jan 19, 1930, 82 y.o.   MRN: [id number]  HPI  Patient here for a scheduled follow up.  She is accompanied by her daughter.  History obtained from both of them.  She has been under increased stress with her husband's health issues.  Overall feels she is handling things relatively well.  Just saw gyn.  Everything checked out ok.  Saw urology.  Recommended estrogen cream.  Tries to stay active.  Not checking her sugars.  Have been elevated.  Bowels appear to be doing better.  States has a history of parathyroid issues.  Desires no surgery.  Calcium recently has been wnl.  She is having problems with neuropathy.  Increased problems in her hands and feet.  Her hands are bothering her more now.  Eating well.  No nausea or vomiting.     Past Medical History:  Diagnosis Date  . Allergy   . Anemia   . Arthritis   . Atrial fibrillation ([place])   . CAD (coronary artery disease)   . Cancer ([place])    H/O skin cancer, squamous  . Diabetes ([place])   . Heart attack ([place])   . Heart murmur   . History of sick sinus syndrome    s/p pacemaker placement  . Hyperlipidemia   . Macular degeneration   . Neuropathy ([place])   . Neuropathy ([place])   . Stroke ([place])   . TIA (transient ischemic attack)    Past Surgical History:  Procedure Laterality Date  . ABDOMINAL HYSTERECTOMY    . APPENDECTOMY    . [place]  . BREAST SURGERY    . CARDIAC CATHETERIZATION  1989  . CATARACT EXTRACTION  1997 and 1998  . COLONOSCOPY WITH PROPOFOL N/A 09/12/2015   Procedure: COLONOSCOPY WITH PROPOFOL;  Surgeon: [name], MD;  Location: [facility] ENDOSCOPY;  Service: Endoscopy;  Laterality: N/A;  . pace maker    . [place]  . TONSILLECTOMY AND ADENOIDECTOMY     Family History  Problem Relation Age of Onset  . Stroke Mother   . Diabetes Mother   .  Cancer Maternal Aunt     Breast Cancer  . Arthritis Maternal Grandmother   . Cancer Maternal Aunt     Lung Cancer  . Kidney disease Neg Hx   . Bladder Cancer Neg Hx    Social History   Social History  . Marital status: Single    Spouse name: N/A  . Number of children: N/A  . Years of education: N/A   Social History Main Topics  . Smoking status: Former Research scientist (life sciences)  . Smokeless tobacco: Never Used     Comment: quit 1970  . Alcohol use No  . Drug use: No  . Sexual activity: Not Asked   Other Topics Concern  . None   Social History Narrative  . None    Outpatient Encounter Prescriptions as of 06/03/2016  Medication Sig  . Aflibercept 2 MG/0.05ML SOLN   . aspirin 81 MG tablet Take 81 mg by mouth daily.  . blood glucose meter kit and supplies Per patient please dispense One touch Ultra2 meter.Use meter and supplies three times a day to check blood sugar. ICD10: E11.40  . conjugated estrogens (PREMARIN) vaginal cream Place 1 Applicatorful vaginally daily. Apply 0.'5mg'$  (  pea-sized amount)  just inside the vaginal introitus with a finger-tip every night for two weeks and then Monday, Wednesday and Friday nights.  . dapagliflozin propanediol (FARXIGA) 5 MG TABS tablet Take 5 mg by mouth.  . digoxin (LANOXIN) 0.125 MG tablet   . estradiol (ESTRACE VAGINAL) 0.1 MG/GM vaginal cream Apply 0.25m (pea-sized amount)  just inside the vaginal introitus with a finger-tip every night for two weeks and then Monday, Wednesday and Friday nights.  . furosemide (LASIX) 20 MG tablet Take 20 mg by mouth.  .Marland Kitchenglucose blood test strip Use three times a day to check blood sugar. Please dispense One Touch Ultra 2 strips to go with meter. ICD-10:E11.40  . guaiFENesin 200 MG tablet Take 400 mg by mouth.  . Lactobacillus (PROBIOTIC ACIDOPHILUS PO) Take by mouth.  . magnesium sulfate 50 % injection Take by mouth.  . metFORMIN (GLUCOPHAGE XR) 500 MG 24 hr tablet Take two tablets bid  . metoprolol (LOPRESSOR) 50 MG  tablet Take 50 mg by mouth 2 (two) times daily.  . Multiple Vitamins-Minerals (PRESERVISION AREDS 2 PO) Take by mouth.  . Probiotic Product (PROBIOTIC PO) Take by mouth daily.  . sitaGLIPtin (JANUVIA) 100 MG tablet Take 1 tablet (100 mg total) by mouth daily.  .Marland Kitchensulfamethoxazole-trimethoprim (BACTRIM DS,SEPTRA DS) 800-160 MG tablet Take by mouth.  . vitamin B-12 (CYANOCOBALAMIN) 1000 MCG tablet Take 1,000 mcg by mouth daily.  . [DISCONTINUED] cefdinir (OMNICEF) 300 MG capsule Take 1 capsule (300 mg total) by mouth 2 (two) times daily.  . [DISCONTINUED] Cholecalciferol (VITAMIN D-1000 MAX ST) 1000 units tablet Take by mouth.  . [DISCONTINUED] metFORMIN (GLUCOPHAGE XR) 500 MG 24 hr tablet Take two tablets bid   No facility-administered encounter medications on file as of 06/03/2016.     Review of Systems  Constitutional: Negative for appetite change and unexpected weight change.  HENT: Negative for congestion and sinus pressure.   Respiratory: Negative for cough, chest tightness and shortness of breath.   Cardiovascular: Negative for chest pain, palpitations and leg swelling.  Gastrointestinal: Negative for abdominal pain, diarrhea, nausea and vomiting.  Genitourinary: Negative for difficulty urinating and dysuria.  Musculoskeletal: Negative for back pain and joint swelling.  Skin: Negative for color change and rash.  Neurological: Negative for dizziness, light-headedness and headaches.  Psychiatric/Behavioral: Negative for agitation and dysphoric mood.       Objective:    Physical Exam  Constitutional: She appears well-developed and well-nourished. No distress.  HENT:  Nose: Nose normal.  Mouth/Throat: Oropharynx is clear and moist.  Neck: Neck supple. No thyromegaly present.  Cardiovascular: Normal rate and regular rhythm.   Pulmonary/Chest: Breath sounds normal. No respiratory distress. She has no wheezes.  Abdominal: Soft. Bowel sounds are normal. There is no tenderness.    Musculoskeletal: She exhibits no edema or tenderness.  Lymphadenopathy:    She has no cervical adenopathy.  Skin: No rash noted. No erythema.  Psychiatric: She has a normal mood and affect. Her behavior is normal.    BP 132/86   Pulse 87   Temp 97.7 F (36.5 C) (Oral)   Ht 5' 3"  (1.6 m)   Wt 166 lb (75.3 kg)   SpO2 94%   BMI 29.41 kg/m  Wt Readings from Last 3 Encounters:  06/03/16 166 lb (75.3 kg)  06/02/16 166 lb 6.4 oz (75.5 kg)  03/10/16 173 lb 2 oz (78.5 kg)     Lab Results  Component Value Date   WBC 6.3 10/01/2015   HGB  13.9 10/01/2015   HCT 44 10/01/2015   PLT 182 10/01/2015   GLUCOSE 293 (H) 03/09/2016   CHOL 211 (H) 03/09/2016   TRIG 129.0 03/09/2016   HDL 58.40 03/09/2016   LDLCALC 127 (H) 03/09/2016   ALT 23 03/09/2016   AST 22 03/09/2016   NA 136 03/09/2016   K 4.5 03/09/2016   CL 102 03/09/2016   CREATININE 0.71 03/09/2016   BUN 13 03/09/2016   CO2 23 03/09/2016   TSH 3.22 03/09/2016   HGBA1C 10.2 (H) 03/09/2016   MICROALBUR 65.4 (H) 03/09/2016    Ct Abdomen Pelvis W Contrast  Result Date: 03/27/2016 CLINICAL DATA:  Lower abdominal pain for several months EXAM: CT ABDOMEN AND PELVIS WITH CONTRAST TECHNIQUE: Multidetector CT imaging of the abdomen and pelvis was performed using the standard protocol following bolus administration of intravenous contrast. CONTRAST:  141m ISOVUE-300 IOPAMIDOL (ISOVUE-300) INJECTION 61% COMPARISON:  08/15/2015 FINDINGS: Lower chest:  No acute findings. Hepatobiliary: Fatty infiltration of the liver is noted. A hypodensity is noted in the dome of the liver stable from the prior exam. The gallbladder is within normal limits. Pancreas: No mass, inflammatory changes, or other significant abnormality. Spleen: Within normal limits in size and appearance. Adrenals/Urinary Tract: Adrenal glands are unremarkable. Kidneys demonstrate nonobstructing stone in the lower pole on the left measuring approximately 14 mm. This is slightly  more prominent than that seen on the prior exam. Tiny renal cysts are again noted. The bladder is well distended. Stomach/Bowel: The appendix has been surgically removed. Diverticular change of the colon is again identified. No diverticulitis is seen. Vascular/Lymphatic: Aortoiliac calcifications are noted without aneurysmal dilatation. No significant lymphadenopathy is noted. Reproductive: The uterus has been surgically removed. A small right ovarian cyst is again noted and decreased in size now measuring 1.9 cm. It previously measured 2.5 cm. Other: None. Musculoskeletal: Multilevel degenerative changes of the lumbar spine are seen. IMPRESSION: Reduction in size of the right ovarian cyst as described. Nonobstructing left renal calculus increased in size from the prior exam. Fatty liver. Hepatic and renal cysts which are stable. Diverticulosis without diverticulitis. Electronically Signed   By: MInez CatalinaM.D.   On: 03/27/2016 10:35       Assessment & Plan:   Problem List Items Addressed This Visit    Anemia, iron deficiency    Has been followed by Dr MGaylyn Cheers  Would like to start getting cbc's followed here.        Relevant Orders   CBC with Differential/Platelet   Ferritin   Atrial fibrillation (HCC)    Followed by Dr FUbaldo Glassing  Stable.  Pacemaker in place.        Relevant Orders   Digoxin level   CAD (coronary artery disease)    Has been followed by cardiology.       Diabetes mellitus with neuropathy (HCC)    Low carb diet and exercise.  Have asked her to start monitoring her blood sugar.  Follow met b and a1c.        Relevant Medications   metFORMIN (GLUCOPHAGE XR) 500 MG 24 hr tablet   Other Relevant Orders   Hemoglobin AJ6B  Basic metabolic panel   Diabetic [place] ([place]    Discussed treatment options.  She has had issues with lyrica and gabapentin.  Discussed TCA - low dose.  Discussed starting alpha lipoic acid.  She prefers to start this.  Follow.        Relevant  Medications   metFORMIN (GLUCOPHAGE  XR) 500 MG 24 hr tablet   History of colonic polyps    Colonoscopy 09/12/15 as outlined.        Hypercholesterolemia    Low cholesterol diet and exercise.  Follow lipid panel.        Relevant Orders   Lipid panel   Hepatic function panel   Stress    Overall she feel she is handling things relatively well.  Follow.         Other Visit Diagnoses    Neuropathy ([place])    -  Primary   Encounter for immunization       Relevant Medications   metFORMIN (GLUCOPHAGE XR) 500 MG 24 hr tablet   Other Relevant Orders   Flu vaccine HIGH DOSE PF (Completed)   CBC with Differential/Platelet   Lipid panel   Ferritin   Hepatic function panel   Hemoglobin B8N   Basic metabolic panel   Digoxin level       [name], MD

## 2016-06-04 NOTE — Telephone Encounter (Signed)
Spoke with daughter [name]. Scheduled patient appointments for Mondays at 2:30 for the next 12 weeks. Daughter ok with plan.

## 2016-06-07 ENCOUNTER — Encounter: Payer: Self-pay | Admitting: Internal Medicine

## 2016-06-07 ENCOUNTER — Other Ambulatory Visit: Payer: Self-pay | Admitting: Internal Medicine

## 2016-06-07 DIAGNOSIS — E2839 Other primary ovarian failure: Secondary | ICD-10-CM

## 2016-06-07 MED ORDER — METFORMIN HCL ER 500 MG PO TB24: ORAL_TABLET | ORAL | 2 refills | Status: DC

## 2016-06-07 NOTE — Assessment & Plan Note (Signed)
Followed by Dr Ubaldo Glassing.  Stable.  Pacemaker in place.

## 2016-06-07 NOTE — Assessment & Plan Note (Signed)
Low cholesterol diet and exercise.  Follow lipid panel.   

## 2016-06-07 NOTE — Assessment & Plan Note (Signed)
Has been followed by cardiology.  

## 2016-06-07 NOTE — Assessment & Plan Note (Signed)
Has been followed by Dr Gaylyn Cheers.  Would like to start getting cbc's followed here.

## 2016-06-07 NOTE — Assessment & Plan Note (Signed)
Colonoscopy 09/12/15 as outlined.

## 2016-06-07 NOTE — Assessment & Plan Note (Signed)
Low carb diet and exercise.  Have asked her to start monitoring her blood sugar.  Follow met b and a1c.

## 2016-06-07 NOTE — Assessment & Plan Note (Signed)
Overall she feel she is handling things relatively well.  Follow.

## 2016-06-07 NOTE — Assessment & Plan Note (Signed)
Discussed treatment options.  She has had issues with lyrica and gabapentin.  Discussed TCA - low dose.  Discussed starting alpha lipoic acid.  She prefers to start this.  Follow.

## 2016-06-15 ENCOUNTER — Ambulatory Visit (INDEPENDENT_AMBULATORY_CARE_PROVIDER_SITE_OTHER): Payer: Medicare Other | Admitting: Urology

## 2016-06-15 ENCOUNTER — Encounter: Payer: Self-pay | Admitting: Urology

## 2016-06-15 ENCOUNTER — Telehealth: Payer: Self-pay | Admitting: *Deleted

## 2016-06-15 ENCOUNTER — Encounter: Payer: Self-pay | Admitting: Internal Medicine

## 2016-06-15 ENCOUNTER — Ambulatory Visit: Payer: Medicare Other | Admitting: Urology

## 2016-06-15 VITALS — BP 152/80 | HR 83 | Ht 62.5 in | Wt 167.1 lb

## 2016-06-15 DIAGNOSIS — N390 Urinary tract infection, site not specified: Secondary | ICD-10-CM | POA: Diagnosis not present

## 2016-06-15 DIAGNOSIS — N39498 Other specified urinary incontinence: Secondary | ICD-10-CM | POA: Diagnosis not present

## 2016-06-15 DIAGNOSIS — R109 Unspecified abdominal pain: Secondary | ICD-10-CM

## 2016-06-15 DIAGNOSIS — N952 Postmenopausal atrophic vaginitis: Secondary | ICD-10-CM

## 2016-06-15 DIAGNOSIS — N200 Calculus of kidney: Secondary | ICD-10-CM

## 2016-06-15 DIAGNOSIS — I2510 Atherosclerotic heart disease of native coronary artery without angina pectoris: Secondary | ICD-10-CM | POA: Diagnosis not present

## 2016-06-15 LAB — URINALYSIS, COMPLETE
Bilirubin, UA: NEGATIVE
Ketones, UA: NEGATIVE
Nitrite, UA: NEGATIVE
Specific Gravity, UA: 1.02 (ref 1.005–1.030)
Urobilinogen, Ur: 0.2 mg/dL (ref 0.2–1.0)
pH, UA: 5.5 (ref 5.0–7.5)

## 2016-06-15 LAB — MICROSCOPIC EXAMINATION: WBC, UA: >30 /hpf — AB (ref 0–?)

## 2016-06-15 NOTE — Telephone Encounter (Signed)
Pt daughter dropped off form to be filled out.. Placed in Dr.Scotts folder up front.. Please advise daughter when complete

## 2016-06-15 NOTE — Progress Notes (Signed)
06/15/2016 12:38 PM   Holly Rose May 30, 1930 [id number]  Referring provider: [name], MD [address] [place], [place] [address]  Chief Complaint  Patient presents with  . Urinary Incontinence    PTNS 1    HPI: Patient is an 82 year old Caucasian female who presents today to initiate PTNS therapy, this will be the first of 12 weekly treatments.  Background history Patient was referred to [place] by, Dr. [name],  for recurrent urinary tract infections.  Patient states that she has had two urinary tract infections over the last year.  Her symptoms with a urinary tract infection consist of dysuria, suprapubic pain and pain in the left flank.   She has been having left flank pain that is intermittent.  It can be intense at time reaching levels of 10/10.   The pain occurs while she is seated and she has to walk around, but she cannot get comfortable.  The pain lasts for several minutes.  She has not found anything that provides relief for the flank pain.  She does have a history of nephrolithiasis, and GU surgery.  She has had bladder tacking in 1975 and 1995.   She had rectocele repair and bladder reconstruction 1996.  Reviewing her records,  she has had two documented infections.  One for e.coli and one for proteus with pan sensitivities.  She is not sexually active.  She is post menopausal.  She admits to diarrhea.  She does engage in good perineal hygiene.  She does not take tub baths.  She has incontinence.  She is using incontinence pads. She wears two thick pads daily, sometimes wetting through the pads onto her clothes.  She underwent a CT with contrast in 03/2016 which demonstrated a reduction in size of the right ovarian cyst as described.  Nonobstructing left renal calculus increased in size from the prior exam.  Fatty liver.  Hepatic and renal cysts which are stable.  Diverticulosis without diverticulitis.  She is drinking adequate amount of water daily.   Her PVR at her last exam was 0 mL.    Today, she is quite overwrought at and does not want to undergo a PTNS treatment at today's visit.  Her husband has returned home from rehabilitation and he has a poor prognosis.  She is quite tearful about this situation.  She states that she has developed some dysuria and suprapubic pain. She is concerned that she may have a urinary tract infection. She's not had fevers, chills, nausea or vomiting. She has not experienced gross hematuria.  She is not experiencing flank pain at this time.  Her UA today is significant for greater than 30 WBCs and 3-10 rbc's.  She has not started her 24-hour urine test.  She is using the vaginal estrogen cream 3 nights weekly.  PMH: Past Medical History:  Diagnosis Date  . Allergy   . Anemia   . Arthritis   . Atrial fibrillation ([place])   . CAD (coronary artery disease)   . Cancer ([place])    H/O skin cancer, squamous  . Diabetes ([place])   . Heart attack   . Heart murmur   . History of sick sinus syndrome    s/p pacemaker placement  . Hyperlipidemia   . Macular degeneration   . Neuropathy ([place])   . Neuropathy ([place])   . Pacemaker   . Stroke ([place])   . TIA (transient ischemic attack)     Surgical History: Past Surgical History:  Procedure  Laterality Date  . ABDOMINAL HYSTERECTOMY    . APPENDECTOMY    . [place]  . BREAST SURGERY    . CARDIAC CATHETERIZATION  1989  . CATARACT EXTRACTION  1997 and 1998  . COLONOSCOPY WITH PROPOFOL N/A 09/12/2015   Procedure: COLONOSCOPY WITH PROPOFOL;  Surgeon: [name], MD;  [facility] ENDOSCOPY;  Service: Endoscopy;  Laterality: N/A;  . pace maker    . [place]  . TONSILLECTOMY AND ADENOIDECTOMY      Home Medications:    Medication List       Accurate as of 06/15/16 11:59 PM. Always use your most recent med list.          Aflibercept 2 MG/0.05ML Soln   aspirin 81 MG tablet Take 81 mg by mouth daily.   blood  glucose meter kit and supplies Per patient please dispense One touch Ultra2 meter.Use meter and supplies three times a day to check blood sugar. ICD10: E11.40   conjugated estrogens vaginal cream Commonly known as:  PREMARIN Place 1 Applicatorful vaginally daily. Apply 0.18m (pea-sized amount)  just inside the vaginal introitus with a finger-tip every night for two weeks and then Monday, Wednesday and Friday nights.   dapagliflozin propanediol 5 MG Tabs tablet Commonly known as:  FARXIGA Take 5 mg by mouth.   digoxin 0.125 MG tablet Commonly known as:  LANOXIN   estradiol 0.1 MG/GM vaginal cream Commonly known as:  ESTRACE VAGINAL Apply 0.527m(pea-sized amount)  just inside the vaginal introitus with a finger-tip every night for two weeks and then Monday, Wednesday and Friday nights.   furosemide 20 MG tablet Commonly known as:  LASIX Take 20 mg by mouth.   glucose blood test strip Use three times a day to check blood sugar. Please dispense One Touch Ultra 2 strips to go with meter. ICD-10:E11.40   guaiFENesin 200 MG tablet Take 400 mg by mouth.   JANUVIA 100 MG tablet Generic drug:  sitaGLIPtin TAKE ONE TABLET BY MOUTH ONCE DAILY   magnesium sulfate 50 % injection Take by mouth.   MAGNESIUM SULFATE PO Take by mouth.   metFORMIN 500 MG 24 hr tablet Commonly known as:  GLUCOPHAGE XR Take two tablets bid   metoprolol 50 MG tablet Commonly known as:  LOPRESSOR Take 50 mg by mouth 2 (two) times daily.   PRESERVISION AREDS 2 PO Take by mouth.   PROBIOTIC ACIDOPHILUS PO Take by mouth.   PROBIOTIC PO Take by mouth daily.   sulfamethoxazole-trimethoprim 800-160 MG tablet Commonly known as:  BACTRIM DS,SEPTRA DS Take by mouth.   vitamin B-12 1000 MCG tablet Commonly known as:  CYANOCOBALAMIN Take 1,000 mcg by mouth daily.       Allergies:  Allergies  Allergen Reactions  . Lyrica [Pregabalin] Other (See Comments) and Swelling    Other reaction(s):  SWELLING/EDEMA Other reaction(s): SWELLING/EDEMA Sleepy, does not eat  . Neurontin [Gabapentin] Other (See Comments) and Nausea And Vomiting    Other reaction(s): Other (See Comments) Out of it Other reaction(s): Other (See Comments) Out of it Other reaction(s): OTHER Out of it  . Bactrim [Sulfamethoxazole-Trimethoprim]     Made her dizziness    Family History: Family History  Problem Relation Age of Onset  . Stroke Mother   . Diabetes Mother   . Cancer Maternal Aunt     Breast Cancer  . Arthritis Maternal Grandmother   . Cancer Maternal Aunt  Lung Cancer  . Kidney disease Neg Hx   . Bladder Cancer Neg Hx     Social History:  reports that she has quit smoking. She has never used smokeless tobacco. She reports that she does not drink alcohol or use drugs.  ROS: UROLOGY Frequent Urination?: Yes Hard to postpone urination?: Yes Burning/pain with urination?: Yes Get up at night to urinate?: Yes Leakage of urine?: Yes Urine stream starts and stops?: No Trouble starting stream?: No Do you have to strain to urinate?: No Blood in urine?: No Urinary tract infection?: Yes Sexually transmitted disease?: No Injury to kidneys or bladder?: No Painful intercourse?: No Weak stream?: No Currently pregnant?: No Vaginal bleeding?: No Last menstrual period?: n  Gastrointestinal Nausea?: No Vomiting?: No Indigestion/heartburn?: No Diarrhea?: No Constipation?: No  Constitutional Fever: No Night sweats?: No Weight loss?: No Fatigue?: No  Skin Skin rash/lesions?: No Itching?: No  Eyes Blurred vision?: No Double vision?: No  Ears/Nose/Throat Sore throat?: No Sinus problems?: No  Hematologic/Lymphatic Swollen glands?: No Easy bruising?: No  Cardiovascular Leg swelling?: Yes Chest pain?: No  Respiratory Cough?: No Shortness of breath?: No  Endocrine Excessive thirst?: No  Musculoskeletal Back pain?: Yes Joint pain?: Yes  Neurological Headaches?:  No Dizziness?: No  Psychologic Depression?: No Anxiety?: No  Physical Exam: BP (!) 152/80   Pulse 83   Ht 5' 2.5" (1.588 m)   Wt 167 lb 1.6 oz (75.8 kg)   BMI 30.08 kg/m   Constitutional: Well nourished. Alert and oriented, No acute distress. HEENT: [place] AT, moist mucus membranes. Trachea midline, no masses. Cardiovascular: No clubbing, cyanosis, or edema. Skin: No rashes, bruises or suspicious lesions. Lymph: No cervical or inguinal adenopathy. Neurologic: Grossly intact, no focal deficits, moving all 4 extremities. Psychiatric: Normal mood and affect.  Laboratory Data: Lab Results  Component Value Date   WBC 6.3 10/01/2015   HGB 13.9 10/01/2015   HCT 44 10/01/2015   MCV 88.0 08/13/2015   PLT 182 10/01/2015    Lab Results  Component Value Date   CREATININE 0.71 03/09/2016    Lab Results  Component Value Date   HGBA1C 10.2 (H) 03/09/2016    Lab Results  Component Value Date   TSH 3.22 03/09/2016       Component Value Date/Time   CHOL 211 (H) 03/09/2016 0923   HDL 58.40 03/09/2016 0923   CHOLHDL 4 03/09/2016 0923   VLDL 25.8 03/09/2016 0923   LDLCALC 127 (H) 03/09/2016 0923    Lab Results  Component Value Date   AST 22 03/09/2016   Lab Results  Component Value Date   ALT 23 03/09/2016     Pertinent Imaging: CLINICAL DATA:  Lower abdominal pain for several months  EXAM: CT ABDOMEN AND PELVIS WITH CONTRAST  TECHNIQUE: Multidetector CT imaging of the abdomen and pelvis was performed using the standard protocol following bolus administration of intravenous contrast.  CONTRAST:  179m ISOVUE-300 IOPAMIDOL (ISOVUE-300) INJECTION 61%  COMPARISON:  08/15/2015  FINDINGS: Lower chest:  No acute findings.  Hepatobiliary: Fatty infiltration of the liver is noted. A hypodensity is noted in the dome of the liver stable from the prior exam. The gallbladder is within normal limits.  Pancreas: No mass, inflammatory changes, or other  significant abnormality.  Spleen: Within normal limits in size and appearance.  Adrenals/Urinary Tract: Adrenal glands are unremarkable. Kidneys demonstrate nonobstructing stone in the lower pole on the left measuring approximately 14 mm. This is slightly more prominent than that seen on the prior  exam. Tiny renal cysts are again noted. The bladder is well distended.  Stomach/Bowel: The appendix has been surgically removed. Diverticular change of the colon is again identified. No diverticulitis is seen.  Vascular/Lymphatic: Aortoiliac calcifications are noted without aneurysmal dilatation. No significant lymphadenopathy is noted.  Reproductive: The uterus has been surgically removed. A small right ovarian cyst is again noted and decreased in size now measuring 1.9 cm. It previously measured 2.5 cm.  Other: None.  Musculoskeletal: Multilevel degenerative changes of the lumbar spine are seen.  IMPRESSION: Reduction in size of the right ovarian cyst as described.  Nonobstructing left renal calculus increased in size from the prior exam.  Fatty liver.  Hepatic and renal cysts which are stable.  Diverticulosis without diverticulitis.   Electronically Signed   By: Inez Catalina M.D.   On: 03/27/2016 10:35   Assessment & Plan:    1. Vaginal atrophy  - Continue the vaginal estrogen cream three nights weekly  - She will follow up in three months for an exam.    2. Left renal stone  - 14 mm left lower pole stone  - serum creatinine is stable at 0.7 over the last 10 months  - not a surgical candidate at this time  - obtain a 24 hour work up to hopefully avoid further enlargement of the stone and prevent other stones from forming  - RTC to discuss results - pending at this time  - Advised to contact our office or seek treatment in the ED if becomes febrile or pain/ vomiting are difficult control in order to arrange for emergent/urgent intervention  3.  Recurrent UTI's  - Reviewed preventative measures   - UA is suspicious for infection- sent for culture  -  4. Left flank pain  - not likely due the left renal stone as it is not obstructing at this time  5. Incontinence  - Patient will RTC next week for her 1st PTNS                                  Return in about 1 week (around 06/22/2016) for # 1 PTNS.  These notes generated with voice recognition software. I apologize for typographical errors.  Zara Council, [facility] [address] [place], [place] [address] [phone]

## 2016-06-15 NOTE — Telephone Encounter (Signed)
Spoke with patients daughter per shannon urine looks suspicious for infection and we ordered a culture should be back Wednesday or Thursday. Someone will call her with the results as they come in. Patient's daughter ok with plan.

## 2016-06-18 DIAGNOSIS — H353231 Exudative age-related macular degeneration, bilateral, with active choroidal neovascularization: Secondary | ICD-10-CM | POA: Diagnosis not present

## 2016-06-18 LAB — CULTURE, URINE COMPREHENSIVE

## 2016-06-19 ENCOUNTER — Telehealth: Payer: Self-pay

## 2016-06-19 DIAGNOSIS — N390 Urinary tract infection, site not specified: Secondary | ICD-10-CM

## 2016-06-19 MED ORDER — AMOXICILLIN-POT CLAVULANATE 875-125 MG PO TABS: 1.0000 | ORAL_TABLET | Freq: Two times a day (BID) | ORAL | 0 refills | Status: AC

## 2016-06-19 NOTE — Telephone Encounter (Signed)
-----   Message from Nori Riis, PA-C sent at 06/18/2016 12:09 PM EDT ----- Patient's urine culture was positive and she needs to start Augmentin 875/125, one twice daily for seven days.

## 2016-06-19 NOTE — Telephone Encounter (Signed)
Spoke with pt daughter and made aware abx were sent to pharmacy. Daughter voiced understanding.  

## 2016-06-22 ENCOUNTER — Encounter: Payer: Self-pay | Admitting: Urology

## 2016-06-22 ENCOUNTER — Ambulatory Visit (INDEPENDENT_AMBULATORY_CARE_PROVIDER_SITE_OTHER): Payer: Medicare Other | Admitting: Urology

## 2016-06-22 VITALS — BP 150/85 | HR 69 | Ht 62.5 in | Wt 166.5 lb

## 2016-06-22 DIAGNOSIS — N39498 Other specified urinary incontinence: Secondary | ICD-10-CM

## 2016-06-22 DIAGNOSIS — I2510 Atherosclerotic heart disease of native coronary artery without angina pectoris: Secondary | ICD-10-CM | POA: Diagnosis not present

## 2016-06-22 DIAGNOSIS — N3941 Urge incontinence: Secondary | ICD-10-CM | POA: Diagnosis not present

## 2016-06-22 NOTE — Progress Notes (Signed)
PTNS  Session # 1  Health & Social Factors:  Caffeine: 2 Alcohol: 0 Daytime voids #per day: 10 Night-time voids #per night: 3 Urgency: Strong Incontinence Episodes #per day: 3 Ankle used: Right Treatment Setting: 12 Feeling/ Response: Sensory   Preformed By: Zara Council, PA-C  Assistant: Elberta Leatherwood, CMA  Follow Up: 1 week #2

## 2016-06-22 NOTE — Progress Notes (Signed)
06/22/2016 2:58 PM   Holly Rose 11/22/1929 [id number]  Referring provider: [name], MD [address] [place], [place] [address]  Chief Complaint  Patient presents with  . Urinary Incontinence    PTNS      HPI: Patient is an 82 year old Caucasian female who presents today to initiate PTNS therapy, this will be the first of 12 weekly treatments.  Background history Patient was referred to [place] by, Dr. [name],  for recurrent urinary tract infections.  Patient states that she has had two urinary tract infections over the last year.  Her symptoms with a urinary tract infection consist of dysuria, suprapubic pain and pain in the left flank.   She has been having left flank pain that is intermittent.  It can be intense at time reaching levels of 10/10.   The pain occurs while she is seated and she has to walk around, but she cannot get comfortable.  The pain lasts for several minutes.  She has not found anything that provides relief for the flank pain.  She does have a history of nephrolithiasis, and GU surgery.  She has had bladder tacking in 1975 and 1995.   She had rectocele repair and bladder reconstruction 1996.  Reviewing her records,  she has had two documented infections.  One for e.coli and one for proteus with pan sensitivities.  She is not sexually active.  She is post menopausal.  She admits to diarrhea.  She does engage in good perineal hygiene.  She does not take tub baths.  She has incontinence.  She is using incontinence pads. She wears two thick pads daily, sometimes wetting through the pads onto her clothes.  She underwent a CT with contrast in 03/2016 which demonstrated a reduction in size of the right ovarian cyst as described.  Nonobstructing left renal calculus increased in size from the prior exam.  Fatty liver.  Hepatic and renal cysts which are stable.  Diverticulosis without diverticulitis.  She is drinking adequate amount of water daily.  Her  PVR at her last exam was 0 mL.    She is ready to begin her PTNS treatment.       Contraindications present for PTNS      Pacemaker- YES (cardiac clearance is obtained)      Implantable defibrillator- NO       History of abnormal bleeding - NO      History of neuropathies or nerve damage - NO  Discussed with patient possible complications of procedure, such as discomfort, bleeding at insertion/stimulation site, procedure consent signed  Today, she is experiencing 10 day time voids, 3 night time voids and 3 incontinence episodes a day.  Her urgency is strong.  She denies dysuria, gross hematuria and suprapubic pain.  She has not had recent fevers, chills, nausea or vomiting.  She is currently on Augmentin 875/125, one tablet twice daily for a urine culture positive for proteus mirabilis.    Patient goals: She would like to reduce her urgency, frequency, no accidents, sleep through the night and voiding every two hours.     PMH: Past Medical History:  Diagnosis Date  . Allergy   . Anemia   . Arthritis   . Atrial fibrillation ([place])   . CAD (coronary artery disease)   . Cancer ([place])    H/O skin cancer, squamous  . Diabetes ([place])   . Heart attack   . Heart murmur   . History of sick sinus syndrome  s/p pacemaker placement  . Hyperlipidemia   . Macular degeneration   . Neuropathy ([place])   . Neuropathy ([place])   . Pacemaker   . Stroke ([place])   . TIA (transient ischemic attack)     Surgical History: Past Surgical History:  Procedure Laterality Date  . ABDOMINAL HYSTERECTOMY    . APPENDECTOMY    . [place]  . BREAST SURGERY    . CARDIAC CATHETERIZATION  1989  . CATARACT EXTRACTION  1997 and 1998  . COLONOSCOPY WITH PROPOFOL N/A 09/12/2015   Procedure: COLONOSCOPY WITH PROPOFOL;  Surgeon: [name], MD;  Location: [facility] ENDOSCOPY;  Service: Endoscopy;  Laterality: N/A;  . pace maker    . [place]  . TONSILLECTOMY AND  ADENOIDECTOMY      Home Medications:    Medication List       Accurate as of 06/22/16  2:58 PM. Always use your most recent med list.          Aflibercept 2 MG/0.05ML Soln   amoxicillin-clavulanate 875-125 MG tablet Commonly known as:  AUGMENTIN Take 1 tablet by mouth 2 (two) times daily.   aspirin 81 MG tablet Take 81 mg by mouth daily.   blood glucose meter kit and supplies Per patient please dispense One touch Ultra2 meter.Use meter and supplies three times a day to check blood sugar. ICD10: E11.40   conjugated estrogens vaginal cream Commonly known as:  PREMARIN Place 1 Applicatorful vaginally daily. Apply 0.33m (pea-sized amount)  just inside the vaginal introitus with a finger-tip every night for two weeks and then Monday, Wednesday and Friday nights.   dapagliflozin propanediol 5 MG Tabs tablet Commonly known as:  FARXIGA Take 5 mg by mouth.   digoxin 0.125 MG tablet Commonly known as:  LANOXIN   estradiol 0.1 MG/GM vaginal cream Commonly known as:  ESTRACE VAGINAL Apply 0.544m(pea-sized amount)  just inside the vaginal introitus with a finger-tip every night for two weeks and then Monday, Wednesday and Friday nights.   furosemide 20 MG tablet Commonly known as:  LASIX Take 20 mg by mouth.   glucose blood test strip Use three times a day to check blood sugar. Please dispense One Touch Ultra 2 strips to go with meter. ICD-10:E11.40   guaiFENesin 200 MG tablet Take 400 mg by mouth.   JANUVIA 100 MG tablet Generic drug:  sitaGLIPtin TAKE ONE TABLET BY MOUTH ONCE DAILY   MAGNESIUM SULFATE PO Take by mouth.   metFORMIN 500 MG 24 hr tablet Commonly known as:  GLUCOPHAGE XR Take two tablets bid   metoprolol 50 MG tablet Commonly known as:  LOPRESSOR Take 50 mg by mouth 2 (two) times daily.   PRESERVISION AREDS 2 PO Take by mouth.   PROBIOTIC ACIDOPHILUS PO Take by mouth.   sulfamethoxazole-trimethoprim 800-160 MG tablet Commonly known as:   BACTRIM DS,SEPTRA DS Take by mouth.   vitamin B-12 1000 MCG tablet Commonly known as:  CYANOCOBALAMIN Take 1,000 mcg by mouth daily.       Allergies:  Allergies  Allergen Reactions  . Lyrica [Pregabalin] Other (See Comments) and Swelling    Other reaction(s): SWELLING/EDEMA Other reaction(s): SWELLING/EDEMA Sleepy, does not eat  . Neurontin [Gabapentin] Other (See Comments) and Nausea And Vomiting    Other reaction(s): Other (See Comments) Out of it Other reaction(s): Other (See Comments) Out of it Other reaction(s): OTHER Out of it  . Bactrim [Sulfamethoxazole-Trimethoprim]  Made her dizziness    Family History: Family History  Problem Relation Age of Onset  . Stroke Mother   . Diabetes Mother   . Cancer Maternal Aunt     Breast Cancer  . Arthritis Maternal Grandmother   . Cancer Maternal Aunt     Lung Cancer  . Kidney disease Neg Hx   . Bladder Cancer Neg Hx     Social History:  reports that she has quit smoking. She has never used smokeless tobacco. She reports that she does not drink alcohol or use drugs.  ROS: UROLOGY Frequent Urination?: No Hard to postpone urination?: Yes Burning/pain with urination?: No Get up at night to urinate?: Yes Leakage of urine?: Yes Urine stream starts and stops?: No Trouble starting stream?: No Do you have to strain to urinate?: No Blood in urine?: No Urinary tract infection?: Yes Sexually transmitted disease?: No Injury to kidneys or bladder?: No Painful intercourse?: No Weak stream?: No Currently pregnant?: No Vaginal bleeding?: No Last menstrual period?: n  Gastrointestinal Nausea?: No Vomiting?: No Indigestion/heartburn?: No Diarrhea?: No Constipation?: No  Constitutional Fever: No Night sweats?: No Weight loss?: No Fatigue?: No  Skin Skin rash/lesions?: No Itching?: No  Eyes Blurred vision?: No Double vision?: No  Ears/Nose/Throat Sore throat?: No Sinus problems?:  No  Hematologic/Lymphatic Swollen glands?: No Easy bruising?: No  Cardiovascular Leg swelling?: Yes Chest pain?: No  Respiratory Cough?: No Shortness of breath?: No  Endocrine Excessive thirst?: No  Musculoskeletal Back pain?: No Joint pain?: No  Neurological Headaches?: No Dizziness?: No  Psychologic Depression?: No Anxiety?: No  Physical Exam: BP (!) 150/85 (BP Location: Left Arm, Patient Position: Sitting, Cuff Size: Normal)   Pulse 69   Ht 5' 2.5" (1.588 m)   Wt 166 lb 8 oz (75.5 kg)   BMI 29.97 kg/m    Constitutional: Well nourished. Alert and oriented, No acute distress. HEENT: [place] AT, moist mucus membranes. Trachea midline, no masses. Cardiovascular: No clubbing, cyanosis, or edema. Skin: No rashes, bruises or suspicious lesions. Lymph: No cervical or inguinal adenopathy. Neurologic: Grossly intact, no focal deficits, moving all 4 extremities. Psychiatric: Normal mood and affect.  Laboratory Data: Lab Results  Component Value Date   WBC 6.3 10/01/2015   HGB 13.9 10/01/2015   HCT 44 10/01/2015   MCV 88.0 08/13/2015   PLT 182 10/01/2015    Lab Results  Component Value Date   CREATININE 0.71 03/09/2016    Lab Results  Component Value Date   HGBA1C 10.2 (H) 03/09/2016    Lab Results  Component Value Date   TSH 3.22 03/09/2016       Component Value Date/Time   CHOL 211 (H) 03/09/2016 0923   HDL 58.40 03/09/2016 0923   CHOLHDL 4 03/09/2016 0923   VLDL 25.8 03/09/2016 0923   LDLCALC 127 (H) 03/09/2016 0923    Lab Results  Component Value Date   AST 22 03/09/2016   Lab Results  Component Value Date   ALT 23 03/09/2016    1. Urge incontinence   PTNS treatment:   The needle electrode was inserted into the lower, inner aspect of the patient's right leg. The surface electrode was  placed on the inside arch of the foot on the treatment leg. The lead set was connected to the stimulator and the needle  electrode clip was connected to  the needle electrode. The stimulator that produces an adjustable electrical pulse that travels  to the sacral nerve plexus via the tibial nerve was  increased to 12 until the patient received a sensory response.     Treatment Plan:    The needle electrode was removed without difficulty to the patient.  Patient tolerated the procedure for 30 minutes.    She will return next week for # 2 out of 12 of their weekly PTNS treatment's                                  Return in about 1 week (around 06/29/2016) for # 2 PTNS.  These notes generated with voice recognition software. I apologize for typographical errors.  Zara Council, [facility] [address], [place], [place] [address] [phone]

## 2016-06-24 DIAGNOSIS — H353231 Exudative age-related macular degeneration, bilateral, with active choroidal neovascularization: Secondary | ICD-10-CM | POA: Diagnosis not present

## 2016-06-29 ENCOUNTER — Ambulatory Visit (INDEPENDENT_AMBULATORY_CARE_PROVIDER_SITE_OTHER): Payer: Medicare Other | Admitting: Urology

## 2016-06-29 ENCOUNTER — Encounter: Payer: Self-pay | Admitting: Urology

## 2016-06-29 VITALS — BP 137/82 | HR 88 | Ht 62.5 in | Wt 164.8 lb

## 2016-06-29 DIAGNOSIS — I2510 Atherosclerotic heart disease of native coronary artery without angina pectoris: Secondary | ICD-10-CM

## 2016-06-29 DIAGNOSIS — N3941 Urge incontinence: Secondary | ICD-10-CM

## 2016-06-29 NOTE — Progress Notes (Signed)
06/29/2016 4:51 PM   Maren A [name] [birth year] [id number]  Referring provider: [name], MD [address] [place], [place] [address]  Chief Complaint  Patient presents with  . PTNS    HPI: Patient is an 82 year old Caucasian female who presents today to initiate PTNS therapy, this will be the second of 12 weekly treatments.  Background history Patient was referred to [place] by, Dr. [name],  for recurrent urinary tract infections.  Patient states that she has had two urinary tract infections over the last year.  Her symptoms with a urinary tract infection consist of dysuria, suprapubic pain and pain in the left flank.   She has been having left flank pain that is intermittent.  It can be intense at time reaching levels of 10/10.   The pain occurs while she is seated and she has to walk around, but she cannot get comfortable.  The pain lasts for several minutes.  She has not found anything that provides relief for the flank pain.  She does have a history of nephrolithiasis, and GU surgery.  She has had bladder tacking in 1975 and 1995.   She had rectocele repair and bladder reconstruction 1996.  Reviewing her records,  she has had two documented infections.  One for e.coli and one for proteus with pan sensitivities.  She is not sexually active.  She is post menopausal.  She admits to diarrhea.  She does engage in good perineal hygiene.  She does not take tub baths.  She has incontinence.  She is using incontinence pads. She wears two thick pads daily, sometimes wetting through the pads onto her clothes.  She underwent a CT with contrast in 03/2016 which demonstrated a reduction in size of the right ovarian cyst as described.  Nonobstructing left renal calculus increased in size from the prior exam.  Fatty liver.  Hepatic and renal cysts which are stable.  Diverticulosis without diverticulitis.  She is drinking adequate amount of water daily.  Her PVR at her last exam was 0  mL.    She is ready to begin her PTNS treatment.       Contraindications present for PTNS      Pacemaker- YES (cardiac clearance is obtained)      Implantable defibrillator- NO       History of abnormal bleeding - NO      History of neuropathies or nerve damage - NO  Discussed with patient possible complications of procedure, such as discomfort, bleeding at insertion/stimulation site, procedure consent signed  Today, she is experiencing 8 day time voids, 2 night time voids and 3 incontinence episodes a day.  Her urgency is strong.  She denies dysuria, gross hematuria and suprapubic pain.  She has not had recent fevers, chills, nausea or vomiting.  She has finished her Augmentin 875/125 for a urine culture positive for proteus mirabilis.    Patient goals: She would like to reduce her urgency, frequency, no accidents, sleep through the night and voiding every two hours.     PMH: Past Medical History:  Diagnosis Date  . Allergy   . Anemia   . Arthritis   . Atrial fibrillation ([place])   . CAD (coronary artery disease)   . Cancer ([place])    H/O skin cancer, squamous  . Diabetes ([place])   . Heart attack   . Heart murmur   . History of sick sinus syndrome    s/p pacemaker placement  . Hyperlipidemia   .  Macular degeneration   . Neuropathy ([place])   . Neuropathy ([place])   . Pacemaker   . Stroke ([place])   . TIA (transient ischemic attack)     Surgical History: Past Surgical History:  Procedure Laterality Date  . ABDOMINAL HYSTERECTOMY    . APPENDECTOMY    . [place]  . BREAST SURGERY    . CARDIAC CATHETERIZATION  1989  . CATARACT EXTRACTION  1997 and 1998  . COLONOSCOPY WITH PROPOFOL N/A 09/12/2015   Procedure: COLONOSCOPY WITH PROPOFOL;  Surgeon: [name], MD;  Location: [facility] ENDOSCOPY;  Service: Endoscopy;  Laterality: N/A;  . pace maker    . [place]  . TONSILLECTOMY AND ADENOIDECTOMY      Home Medications:    Medication List         Accurate as of 06/29/16  4:51 PM. Always use your most recent med list.          Aflibercept 2 MG/0.05ML Soln   aspirin 81 MG tablet Take 81 mg by mouth daily.   blood glucose meter kit and supplies Per patient please dispense One touch Ultra2 meter.Use meter and supplies three times a day to check blood sugar. ICD10: E11.40   conjugated estrogens vaginal cream Commonly known as:  PREMARIN Place 1 Applicatorful vaginally daily. Apply 0.16m (pea-sized amount)  just inside the vaginal introitus with a finger-tip every night for two weeks and then Monday, Wednesday and Friday nights.   dapagliflozin propanediol 5 MG Tabs tablet Commonly known as:  FARXIGA Take 5 mg by mouth.   digoxin 0.125 MG tablet Commonly known as:  LANOXIN   estradiol 0.1 MG/GM vaginal cream Commonly known as:  ESTRACE VAGINAL Apply 0.564m(pea-sized amount)  just inside the vaginal introitus with a finger-tip every night for two weeks and then Monday, Wednesday and Friday nights.   furosemide 20 MG tablet Commonly known as:  LASIX Take 20 mg by mouth.   glucose blood test strip Use three times a day to check blood sugar. Please dispense One Touch Ultra 2 strips to go with meter. ICD-10:E11.40   guaiFENesin 200 MG tablet Take 400 mg by mouth.   JANUVIA 100 MG tablet Generic drug:  sitaGLIPtin TAKE ONE TABLET BY MOUTH ONCE DAILY   MAGNESIUM SULFATE PO Take by mouth.   metFORMIN 500 MG 24 hr tablet Commonly known as:  GLUCOPHAGE XR Take two tablets bid   metoprolol 50 MG tablet Commonly known as:  LOPRESSOR Take 50 mg by mouth 2 (two) times daily.   PRESERVISION AREDS 2 PO Take by mouth.   PROBIOTIC ACIDOPHILUS PO Take by mouth.   sulfamethoxazole-trimethoprim 800-160 MG tablet Commonly known as:  BACTRIM DS,SEPTRA DS Take by mouth.   vitamin B-12 1000 MCG tablet Commonly known as:  CYANOCOBALAMIN Take 1,000 mcg by mouth daily.       Allergies:  Allergies  Allergen  Reactions  . Lyrica [Pregabalin] Other (See Comments) and Swelling    Other reaction(s): SWELLING/EDEMA Other reaction(s): SWELLING/EDEMA Sleepy, does not eat  . Neurontin [Gabapentin] Other (See Comments) and Nausea And Vomiting    Other reaction(s): Other (See Comments) Out of it Other reaction(s): Other (See Comments) Out of it Other reaction(s): OTHER Out of it  . Bactrim [Sulfamethoxazole-Trimethoprim]     Made her dizziness    Family History: Family History  Problem Relation Age of Onset  . Stroke Mother   . Diabetes Mother   .  Cancer Maternal Aunt     Breast Cancer  . Arthritis Maternal Grandmother   . Cancer Maternal Aunt     Lung Cancer  . Kidney disease Neg Hx   . Bladder Cancer Neg Hx     Social History:  reports that she has quit smoking. She has never used smokeless tobacco. She reports that she does not drink alcohol or use drugs.  ROS: UROLOGY Frequent Urination?: Yes Hard to postpone urination?: Yes Burning/pain with urination?: No Get up at night to urinate?: Yes Leakage of urine?: No Urine stream starts and stops?: No Trouble starting stream?: No Do you have to strain to urinate?: No Blood in urine?: No Urinary tract infection?: No Sexually transmitted disease?: No Injury to kidneys or bladder?: No Painful intercourse?: No Weak stream?: No Currently pregnant?: No Vaginal bleeding?: No Last menstrual period?: n  Gastrointestinal Nausea?: No Vomiting?: No Indigestion/heartburn?: No Diarrhea?: No Constipation?: No  Constitutional Fever: No Night sweats?: No Weight loss?: No Fatigue?: No  Skin Skin rash/lesions?: No Itching?: No  Eyes Blurred vision?: No Double vision?: No  Ears/Nose/Throat Sore throat?: No Sinus problems?: No  Hematologic/Lymphatic Swollen glands?: No Easy bruising?: No  Cardiovascular Leg swelling?: No Chest pain?: No  Respiratory Cough?: No Shortness of breath?: No  Endocrine Excessive thirst?:  No  Musculoskeletal Back pain?: No Joint pain?: No  Neurological Headaches?: No Dizziness?: No  Psychologic Depression?: No Anxiety?: No  Physical Exam: BP 137/82   Pulse 88   Ht 5' 2.5" (1.588 m)   Wt 164 lb 12.8 oz (74.8 kg)   BMI 29.66 kg/m   Constitutional: Well nourished. Alert and oriented, No acute distress. HEENT: [place] AT, moist mucus membranes. Trachea midline, no masses. Cardiovascular: No clubbing, cyanosis, or edema. Skin: No rashes, bruises or suspicious lesions. Lymph: No cervical or inguinal adenopathy. Neurologic: Grossly intact, no focal deficits, moving all 4 extremities. Psychiatric: Normal mood and affect.  Laboratory Data: Lab Results  Component Value Date   WBC 6.3 10/01/2015   HGB 13.9 10/01/2015   HCT 44 10/01/2015   MCV 88.0 08/13/2015   PLT 182 10/01/2015    Lab Results  Component Value Date   CREATININE 0.71 03/09/2016    Lab Results  Component Value Date   HGBA1C 10.2 (H) 03/09/2016    Lab Results  Component Value Date   TSH 3.22 03/09/2016       Component Value Date/Time   CHOL 211 (H) 03/09/2016 0923   HDL 58.40 03/09/2016 0923   CHOLHDL 4 03/09/2016 0923   VLDL 25.8 03/09/2016 0923   LDLCALC 127 (H) 03/09/2016 0923    Lab Results  Component Value Date   AST 22 03/09/2016   Lab Results  Component Value Date   ALT 23 03/09/2016    1. Urge incontinence   PTNS treatment:   The needle electrode was inserted into the lower, inner aspect of the patient's right leg. The surface electrode was  placed on the inside arch of the foot on the treatment leg. The lead set was connected to the stimulator and the needle  electrode clip was connected to the needle electrode. The stimulator that produces an adjustable electrical pulse that travels  to the sacral nerve plexus via the tibial nerve was increased to 14 until the patient received a toe flex.     Treatment Plan:    The needle electrode was removed without difficulty  to the patient.  Patient tolerated the procedure for 30 minutes.  She will return next week for # 3 out of 12 of their weekly PTNS treatment's                                  Return for # 3 PTNS.  These notes generated with voice recognition software. I apologize for typographical errors.  Zara Council, [facility] [address], [place],  [address] [phone]

## 2016-07-06 ENCOUNTER — Ambulatory Visit (INDEPENDENT_AMBULATORY_CARE_PROVIDER_SITE_OTHER): Payer: Medicare Other | Admitting: Urology

## 2016-07-06 ENCOUNTER — Encounter: Payer: Self-pay | Admitting: Urology

## 2016-07-06 VITALS — BP 140/82 | HR 93 | Ht 62.5 in | Wt 164.7 lb

## 2016-07-06 DIAGNOSIS — N3941 Urge incontinence: Secondary | ICD-10-CM

## 2016-07-06 DIAGNOSIS — I2510 Atherosclerotic heart disease of native coronary artery without angina pectoris: Secondary | ICD-10-CM

## 2016-07-06 LAB — PTNS-PERCUTANEOUS TIBIAL NERVE STIMULATION: Scan Result: 15

## 2016-07-06 NOTE — Progress Notes (Signed)
07/06/2016 3:03 PM   Etherine A [name] 1930/08/07 [id number]  Referring provider: [name], MD [address] [place], [place] [address]  Chief Complaint  Patient presents with  . Urinary Incontinence    PTNS    HPI: Patient is an 82 year old Caucasian female who presents today to initiate PTNS therapy, this will be the 3rd of 12 weekly treatments.  Background history Patient was referred to [place] by, Dr. [name],  for recurrent urinary tract infections.  Patient states that she has had two urinary tract infections over the last year.  Her symptoms with a urinary tract infection consist of dysuria, suprapubic pain and pain in the left flank.   She has been having left flank pain that is intermittent.  It can be intense at time reaching levels of 10/10.   The pain occurs while she is seated and she has to walk around, but she cannot get comfortable.  The pain lasts for several minutes.  She has not found anything that provides relief for the flank pain.  She does have a history of nephrolithiasis, and GU surgery.  She has had bladder tacking in 1975 and 1995.   She had rectocele repair and bladder reconstruction 1996.  Reviewing her records,  she has had two documented infections.  One for e.coli and one for proteus with pan sensitivities.  She is not sexually active.  She is post menopausal.  She admits to diarrhea.  She does engage in good perineal hygiene.  She does not take tub baths.  She has incontinence.  She is using incontinence pads. She wears two thick pads daily, sometimes wetting through the pads onto her clothes.  She underwent a CT with contrast in 03/2016 which demonstrated a reduction in size of the right ovarian cyst as described.  Nonobstructing left renal calculus increased in size from the prior exam.  Fatty liver.  Hepatic and renal cysts which are stable.  Diverticulosis without diverticulitis.  She is drinking adequate amount of water daily.  Her PVR  at her last exam was 0 mL.    She is ready to begin her PTNS treatment.       Contraindications present for PTNS      Pacemaker- YES (cardiac clearance is obtained)      Implantable defibrillator- NO       History of abnormal bleeding - NO      History of neuropathies or nerve damage - NO   Today, she is experiencing 7 day time voids, 2 night time voids and 3 incontinence episodes a day.  Her urgency is strong.  She denies dysuria, gross hematuria and suprapubic pain.  She has not had recent fevers, chills, nausea or vomiting.    Patient goals: She would like to reduce her urgency, frequency, no accidents, sleep through the night and voiding every two hours.     PMH: Past Medical History:  Diagnosis Date  . Allergy   . Anemia   . Arthritis   . Atrial fibrillation ([place])   . CAD (coronary artery disease)   . Cancer ([place])    H/O skin cancer, squamous  . Diabetes ([place])   . Heart attack   . Heart murmur   . History of sick sinus syndrome    s/p pacemaker placement  . Hyperlipidemia   . Macular degeneration   . Neuropathy ([place])   . Neuropathy ([place])   . Pacemaker   . Stroke ([place])   . TIA (transient ischemic  attack)     Surgical History: Past Surgical History:  Procedure Laterality Date  . ABDOMINAL HYSTERECTOMY    . APPENDECTOMY    . [place]  . BREAST SURGERY    . CARDIAC CATHETERIZATION  1989  . CATARACT EXTRACTION  1997 and 1998  . COLONOSCOPY WITH PROPOFOL N/A 09/12/2015   Procedure: COLONOSCOPY WITH PROPOFOL;  Surgeon: [name], MD;  Location: [facility] ENDOSCOPY;  Service: Endoscopy;  Laterality: N/A;  . pace maker    . [place]  . TONSILLECTOMY AND ADENOIDECTOMY      Home Medications:    Medication List       Accurate as of 07/06/16  3:03 PM. Always use your most recent med list.          Aflibercept 2 MG/0.05ML Soln   aspirin 81 MG tablet Take 81 mg by mouth daily.   blood glucose meter kit and  supplies Per patient please dispense One touch Ultra2 meter.Use meter and supplies three times a day to check blood sugar. ICD10: E11.40   conjugated estrogens vaginal cream Commonly known as:  PREMARIN Place 1 Applicatorful vaginally daily. Apply 0.48m (pea-sized amount)  just inside the vaginal introitus with a finger-tip every night for two weeks and then Monday, Wednesday and Friday nights.   dapagliflozin propanediol 5 MG Tabs tablet Commonly known as:  FARXIGA Take 5 mg by mouth.   digoxin 0.125 MG tablet Commonly known as:  LANOXIN   estradiol 0.1 MG/GM vaginal cream Commonly known as:  ESTRACE VAGINAL Apply 0.549m(pea-sized amount)  just inside the vaginal introitus with a finger-tip every night for two weeks and then Monday, Wednesday and Friday nights.   furosemide 20 MG tablet Commonly known as:  LASIX Take 20 mg by mouth.   glucose blood test strip Use three times a day to check blood sugar. Please dispense One Touch Ultra 2 strips to go with meter. ICD-10:E11.40   guaiFENesin 200 MG tablet Take 400 mg by mouth.   JANUVIA 100 MG tablet Generic drug:  sitaGLIPtin TAKE ONE TABLET BY MOUTH ONCE DAILY   MAGNESIUM SULFATE PO Take by mouth.   metFORMIN 500 MG 24 hr tablet Commonly known as:  GLUCOPHAGE XR Take two tablets bid   metoprolol 50 MG tablet Commonly known as:  LOPRESSOR Take 50 mg by mouth 2 (two) times daily.   PRESERVISION AREDS 2 PO Take by mouth.   PROBIOTIC ACIDOPHILUS PO Take by mouth.   sulfamethoxazole-trimethoprim 800-160 MG tablet Commonly known as:  BACTRIM DS,SEPTRA DS Take by mouth.   vitamin B-12 1000 MCG tablet Commonly known as:  CYANOCOBALAMIN Take 1,000 mcg by mouth daily.       Allergies:  Allergies  Allergen Reactions  . Lyrica [Pregabalin] Other (See Comments) and Swelling    Other reaction(s): SWELLING/EDEMA Other reaction(s): SWELLING/EDEMA Sleepy, does not eat  . Neurontin [Gabapentin] Other (See Comments) and  Nausea And Vomiting    Other reaction(s): Other (See Comments) Out of it Other reaction(s): Other (See Comments) Out of it Other reaction(s): OTHER Out of it  . Bactrim [Sulfamethoxazole-Trimethoprim]     Made her dizziness    Family History: Family History  Problem Relation Age of Onset  . Stroke Mother   . Diabetes Mother   . Cancer Maternal Aunt     Breast Cancer  . Arthritis Maternal Grandmother   . Cancer Maternal Aunt     Lung Cancer  .  Kidney disease Neg Hx   . Bladder Cancer Neg Hx     Social History:  reports that she has quit smoking. She has never used smokeless tobacco. She reports that she does not drink alcohol or use drugs.  ROS: UROLOGY Frequent Urination?: Yes Hard to postpone urination?: Yes Burning/pain with urination?: No Get up at night to urinate?: Yes Leakage of urine?: No Urine stream starts and stops?: No Trouble starting stream?: No Do you have to strain to urinate?: No Blood in urine?: No Urinary tract infection?: No Sexually transmitted disease?: No Injury to kidneys or bladder?: No Painful intercourse?: No Weak stream?: No Currently pregnant?: No Vaginal bleeding?: No Last menstrual period?: n  Gastrointestinal Nausea?: No Vomiting?: No Indigestion/heartburn?: No Diarrhea?: No Constipation?: No  Constitutional Fever: No Night sweats?: No Weight loss?: No Fatigue?: No  Skin Skin rash/lesions?: No Itching?: No  Eyes Blurred vision?: No Double vision?: No  Ears/Nose/Throat Sore throat?: No Sinus problems?: No  Hematologic/Lymphatic Swollen glands?: No Easy bruising?: No  Cardiovascular Leg swelling?: No Chest pain?: No  Respiratory Cough?: No Shortness of breath?: No  Endocrine Excessive thirst?: No  Musculoskeletal Back pain?: No Joint pain?: No  Neurological Headaches?: No Dizziness?: No  Psychologic Depression?: No Anxiety?: No  Physical Exam: BP 140/82   Pulse 93   Ht 5' 2.5" (1.588 m)    Wt 164 lb 11.2 oz (74.7 kg)   BMI 29.64 kg/m   Constitutional: Well nourished. Alert and oriented, No acute distress. HEENT: [place] AT, moist mucus membranes. Trachea midline, no masses. Cardiovascular: No clubbing, cyanosis, or edema. Skin: No rashes, bruises or suspicious lesions. Lymph: No cervical or inguinal adenopathy. Neurologic: Grossly intact, no focal deficits, moving all 4 extremities. Psychiatric: Normal mood and affect.  Laboratory Data: Lab Results  Component Value Date   WBC 6.3 10/01/2015   HGB 13.9 10/01/2015   HCT 44 10/01/2015   MCV 88.0 08/13/2015   PLT 182 10/01/2015    Lab Results  Component Value Date   CREATININE 0.71 03/09/2016    Lab Results  Component Value Date   HGBA1C 10.2 (H) 03/09/2016    Lab Results  Component Value Date   TSH 3.22 03/09/2016       Component Value Date/Time   CHOL 211 (H) 03/09/2016 0923   HDL 58.40 03/09/2016 0923   CHOLHDL 4 03/09/2016 0923   VLDL 25.8 03/09/2016 0923   LDLCALC 127 (H) 03/09/2016 0923    Lab Results  Component Value Date   AST 22 03/09/2016   Lab Results  Component Value Date   ALT 23 03/09/2016    1. Urge incontinence   PTNS treatment:   The needle electrode was inserted into the lower, inner aspect of the patient's right leg. The surface electrode was  placed on the inside arch of the foot on the treatment leg. The lead set was connected to the stimulator and the needle  electrode clip was connected to the needle electrode. The stimulator that produces an adjustable electrical pulse that travels  to the sacral nerve plexus via the tibial nerve was increased to 15 until the patient received a toe flex and sensory response.     Treatment Plan:    The needle electrode was removed without difficulty to the patient.  Patient tolerated the procedure for 30 minutes.    She will return next week for # 4 out of 12 of their weekly PTNS treatment's  Return in  about 1 week (around 07/13/2016) for # 4 PTNS.  These notes generated with voice recognition software. I apologize for typographical errors.  Zara Council, [facility] [address] [place], [place] [address] [phone]

## 2016-07-06 NOTE — Progress Notes (Signed)
Patient brought in her supplies that she was mailed for her 24 hr urine and told me I could have them back that she would not be doing it. I let patient know that we didn't send her the supplies they came from [facility] and she told me to just throw it out then.

## 2016-07-13 ENCOUNTER — Ambulatory Visit (INDEPENDENT_AMBULATORY_CARE_PROVIDER_SITE_OTHER): Payer: Medicare Other | Admitting: Urology

## 2016-07-13 ENCOUNTER — Encounter: Payer: Self-pay | Admitting: Urology

## 2016-07-13 VITALS — BP 160/82 | HR 99 | Ht 62.5 in | Wt 163.5 lb

## 2016-07-13 DIAGNOSIS — N3941 Urge incontinence: Secondary | ICD-10-CM | POA: Diagnosis not present

## 2016-07-13 DIAGNOSIS — I2510 Atherosclerotic heart disease of native coronary artery without angina pectoris: Secondary | ICD-10-CM

## 2016-07-13 NOTE — Progress Notes (Signed)
07/13/2016 3:35 PM   Holly Rose 1930/08/11 [id number]  Referring provider: [name], MD [address] [place], [place] [address]  Chief Complaint  Patient presents with  . PTNS    4th    HPI: Patient is an 82 year old Caucasian female who presents today to initiate PTNS therapy, this will be the 4th of 12 weekly treatments.  Background history Patient was referred to [place] by, Dr. [name],  for recurrent urinary tract infections.  Patient states that she has had two urinary tract infections over the last year.  Her symptoms with a urinary tract infection consist of dysuria, suprapubic pain and pain in the left flank.   She has been having left flank pain that is intermittent.  It can be intense at time reaching levels of 10/10.   The pain occurs while she is seated and she has to walk around, but she cannot get comfortable.  The pain lasts for several minutes.  She has not found anything that provides relief for the flank pain.  She does have a history of nephrolithiasis, and GU surgery.  She has had bladder tacking in 1975 and 1995.   She had rectocele repair and bladder reconstruction 1996.  Reviewing her records,  she has had two documented infections.  One for e.coli and one for proteus with pan sensitivities.  She is not sexually active.  She is post menopausal.  She admits to diarrhea.  She does engage in good perineal hygiene.  She does not take tub baths.  She has incontinence.  She is using incontinence pads. She wears two thick pads daily, sometimes wetting through the pads onto her clothes.  She underwent a CT with contrast in 03/2016 which demonstrated a reduction in size of the right ovarian cyst as described.  Nonobstructing left renal calculus increased in size from the prior exam.  Fatty liver.  Hepatic and renal cysts which are stable.  Diverticulosis without diverticulitis.  She is drinking adequate amount of water daily.  Her PVR at her last exam  was 0 mL.    She is ready to begin her PTNS treatment.       Contraindications present for PTNS      Pacemaker- YES (cardiac clearance is obtained)      Implantable defibrillator- NO       History of abnormal bleeding - NO      History of neuropathies or nerve damage - NO   Today, she is experiencing 7 day time voids (stable), 2 night time voids (stable)  and 0 incontinence episodes a day (improvement).  Her urgency is mild.  She denies dysuria, gross hematuria and suprapubic pain.  She has not had recent fevers, chills, nausea or vomiting.  She feels that the PTNS treatments are providing benefit.    Patient goals: She would like to reduce her urgency, frequency, no accidents, sleep through the night and voiding every two hours.     PMH: Past Medical History:  Diagnosis Date  . Allergy   . Anemia   . Arthritis   . Atrial fibrillation ([place])   . CAD (coronary artery disease)   . Cancer ([place])    H/O skin cancer, squamous  . Diabetes ([place])   . Heart attack   . Heart murmur   . History of sick sinus syndrome    s/p pacemaker placement  . Hyperlipidemia   . Macular degeneration   . Neuropathy ([place])   . Neuropathy ([place])   .  Pacemaker   . Stroke ([place])   . TIA (transient ischemic attack)     Surgical History: Past Surgical History:  Procedure Laterality Date  . ABDOMINAL HYSTERECTOMY    . APPENDECTOMY    . [place]  . BREAST SURGERY    . CARDIAC CATHETERIZATION  1989  . CATARACT EXTRACTION  1997 and 1998  . COLONOSCOPY WITH PROPOFOL N/A 09/12/2015   Procedure: COLONOSCOPY WITH PROPOFOL;  Surgeon: [name], MD;  Location: [facility] ENDOSCOPY;  Service: Endoscopy;  Laterality: N/A;  . pace maker    . [place]  . TONSILLECTOMY AND ADENOIDECTOMY      Home Medications:    Medication List       Accurate as of 07/13/16  3:35 PM. Always use your most recent med list.          Aflibercept 2 MG/0.05ML Soln   aspirin 81 MG  tablet Take 81 mg by mouth daily.   blood glucose meter kit and supplies Per patient please dispense One touch Ultra2 meter.Use meter and supplies three times a day to check blood sugar. ICD10: E11.40   conjugated estrogens vaginal cream Commonly known as:  PREMARIN Place 1 Applicatorful vaginally daily. Apply 0.'5mg'$  (pea-sized amount)  just inside the vaginal introitus with a finger-tip every night for two weeks and then Monday, Wednesday and Friday nights.   dapagliflozin propanediol 5 MG Tabs tablet Commonly known as:  FARXIGA Take 5 mg by mouth.   digoxin 0.125 MG tablet Commonly known as:  LANOXIN   estradiol 0.1 MG/GM vaginal cream Commonly known as:  ESTRACE VAGINAL Apply 0.'5mg'$  (pea-sized amount)  just inside the vaginal introitus with a finger-tip every night for two weeks and then Monday, Wednesday and Friday nights.   furosemide 20 MG tablet Commonly known as:  LASIX Take 20 mg by mouth.   glucose blood test strip Use three times a day to check blood sugar. Please dispense One Touch Ultra 2 strips to go with meter. ICD-10:E11.40   guaiFENesin 200 MG tablet Take 400 mg by mouth.   JANUVIA 100 MG tablet Generic drug:  sitaGLIPtin TAKE ONE TABLET BY MOUTH ONCE DAILY   MAGNESIUM SULFATE PO Take by mouth.   metFORMIN 500 MG 24 hr tablet Commonly known as:  GLUCOPHAGE XR Take two tablets bid   metoprolol 50 MG tablet Commonly known as:  LOPRESSOR Take 50 mg by mouth 2 (two) times daily.   PRESERVISION AREDS 2 PO Take by mouth.   PROBIOTIC ACIDOPHILUS PO Take by mouth.   sulfamethoxazole-trimethoprim 800-160 MG tablet Commonly known as:  BACTRIM DS,SEPTRA DS Take by mouth.   vitamin B-12 1000 MCG tablet Commonly known as:  CYANOCOBALAMIN Take 1,000 mcg by mouth daily.       Allergies:  Allergies  Allergen Reactions  . Lyrica [Pregabalin] Other (See Comments) and Swelling    Other reaction(s): SWELLING/EDEMA Other reaction(s):  SWELLING/EDEMA Sleepy, does not eat  . Neurontin [Gabapentin] Other (See Comments) and Nausea And Vomiting    Other reaction(s): Other (See Comments) Out of it Other reaction(s): Other (See Comments) Out of it Other reaction(s): OTHER Out of it  . Bactrim [Sulfamethoxazole-Trimethoprim]     Made her dizziness    Family History: Family History  Problem Relation Age of Onset  . Stroke Mother   . Diabetes Mother   . Cancer Maternal Aunt     Breast Cancer  . Arthritis Maternal Grandmother   .  Cancer Maternal Aunt     Lung Cancer  . Kidney disease Neg Hx   . Bladder Cancer Neg Hx     Social History:  reports that she has quit smoking. She has never used smokeless tobacco. She reports that she does not drink alcohol or use drugs.  ROS: UROLOGY Frequent Urination?: No Hard to postpone urination?: No Burning/pain with urination?: No Get up at night to urinate?: No Leakage of urine?: No Urine stream starts and stops?: No Trouble starting stream?: No Do you have to strain to urinate?: No Blood in urine?: No Urinary tract infection?: No Sexually transmitted disease?: No Injury to kidneys or bladder?: No Painful intercourse?: No Weak stream?: No Currently pregnant?: No Vaginal bleeding?: No Last menstrual period?: n  Gastrointestinal Nausea?: No Vomiting?: No Indigestion/heartburn?: No Diarrhea?: No Constipation?: No  Constitutional Fever: No Night sweats?: No Weight loss?: No Fatigue?: No  Skin Skin rash/lesions?: No Itching?: No  Eyes Blurred vision?: No Double vision?: No  Ears/Nose/Throat Sore throat?: No Sinus problems?: No  Hematologic/Lymphatic Swollen glands?: No Easy bruising?: No  Cardiovascular Leg swelling?: No Chest pain?: No  Respiratory Cough?: No Shortness of breath?: No  Endocrine Excessive thirst?: No  Musculoskeletal Back pain?: No Joint pain?: No  Neurological Headaches?: No Dizziness?:  No  Psychologic Depression?: No Anxiety?: No  Physical Exam: BP (!) 160/82 (BP Location: Left Arm, Patient Position: Sitting, Cuff Size: Normal)   Pulse 99   Ht 5' 2.5" (1.588 m)   Wt 163 lb 8 oz (74.2 kg)   BMI 29.43 kg/m   Constitutional: Well nourished. Alert and oriented, No acute distress. HEENT: [place] AT, moist mucus membranes. Trachea midline, no masses. Cardiovascular: No clubbing, cyanosis, or edema. Skin: No rashes, bruises or suspicious lesions. Lymph: No cervical or inguinal adenopathy. Neurologic: Grossly intact, no focal deficits, moving all 4 extremities. Psychiatric: Normal mood and affect.  Laboratory Data: Lab Results  Component Value Date   WBC 6.3 10/01/2015   HGB 13.9 10/01/2015   HCT 44 10/01/2015   MCV 88.0 08/13/2015   PLT 182 10/01/2015    Lab Results  Component Value Date   CREATININE 0.71 03/09/2016    Lab Results  Component Value Date   HGBA1C 10.2 (H) 03/09/2016    Lab Results  Component Value Date   TSH 3.22 03/09/2016       Component Value Date/Time   CHOL 211 (H) 03/09/2016 0923   HDL 58.40 03/09/2016 0923   CHOLHDL 4 03/09/2016 0923   VLDL 25.8 03/09/2016 0923   LDLCALC 127 (H) 03/09/2016 0923    Lab Results  Component Value Date   AST 22 03/09/2016   Lab Results  Component Value Date   ALT 23 03/09/2016   PTNS treatment:  The needle electrode was inserted into the lower, inner aspect of the patient's right leg. The surface electrode  was  placed on the inside arch of the foot on the treatment leg. The lead set was connected to the stimulator and the needle  electrode clip was connected to the needle electrode. The stimulator that produces an adjustable electrical pulse that  travels  to the sacral nerve plexus via the tibial nerve was increased to 15 until the patient received a sensory response.    1. Urge incontinence   Treatment Plan:    The needle electrode was removed without difficulty to the patient.  Patient  tolerated the procedure for 30   minutes.   She will return next week for #  5 out of 12 of their weekly PTNS treatment's                                  Return in about 1 week (around 07/20/2016) for # 5 PTNS.  These notes generated with voice recognition software. I apologize for typographical errors.  Zara Council, [facility] [address] [place], [place] [address] [phone]

## 2016-07-13 NOTE — Progress Notes (Signed)
PTNS  Session # 4  Health & Social Factors: No change Caffeine: 1 Alcohol: 0 Daytime voids #per day: 7 Night-time voids #per night: 2 Urgency: mild Incontinence Episodes #per day: 2 Ankle used: right Treatment Setting: 15 Feeling/ Response: sensory Comments:   Preformed By: Zara Council, PA-C  Assistant: Elberta Leatherwood, CMA  Follow Up: 1 week PTNS #5

## 2016-07-14 ENCOUNTER — Ambulatory Visit
Admission: RE | Admit: 2016-07-14 | Discharge: 2016-07-14 | Disposition: A | Discharge status: Home / Self Care | Payer: Medicare Other | Source: Ambulatory Visit | Attending: Internal Medicine | Admitting: Internal Medicine

## 2016-07-14 DIAGNOSIS — E2839 Other primary ovarian failure: Secondary | ICD-10-CM | POA: Diagnosis present

## 2016-07-14 DIAGNOSIS — M8589 Other specified disorders of bone density and structure, multiple sites: Secondary | ICD-10-CM | POA: Insufficient documentation

## 2016-07-14 DIAGNOSIS — M85852 Other specified disorders of bone density and structure, left thigh: Secondary | ICD-10-CM | POA: Diagnosis not present

## 2016-07-15 ENCOUNTER — Encounter: Payer: Self-pay | Admitting: Internal Medicine

## 2016-07-20 ENCOUNTER — Ambulatory Visit (INDEPENDENT_AMBULATORY_CARE_PROVIDER_SITE_OTHER): Payer: Medicare Other | Admitting: Urology

## 2016-07-20 ENCOUNTER — Encounter: Payer: Self-pay | Admitting: Urology

## 2016-07-20 VITALS — BP 148/81 | HR 93 | Ht 62.5 in | Wt 165.7 lb

## 2016-07-20 DIAGNOSIS — I2510 Atherosclerotic heart disease of native coronary artery without angina pectoris: Secondary | ICD-10-CM | POA: Diagnosis not present

## 2016-07-20 DIAGNOSIS — N3941 Urge incontinence: Secondary | ICD-10-CM | POA: Diagnosis not present

## 2016-07-20 LAB — PTNS-PERCUTANEOUS TIBIAL NERVE STIMULATION

## 2016-07-20 NOTE — Progress Notes (Signed)
07/20/2016 3:11 PM   Holly Rose 01-Mar-1930 [id number]  Referring provider: [name], MD [address] [place], [place] [address]  Chief Complaint  Patient presents with  . Follow-up    PTNS    HPI: Patient is an 82 year old Caucasian female who presents today to initiate PTNS therapy, this will be the 5th of 12 weekly treatments.  Background history Patient was referred to [place] by, Dr. [name],  for recurrent urinary tract infections.  Patient states that she has had two urinary tract infections over the last year.  Her symptoms with a urinary tract infection consist of dysuria, suprapubic pain and pain in the left flank.   She has been having left flank pain that is intermittent.  It can be intense at time reaching levels of 10/10.   The pain occurs while she is seated and she has to walk around, but she cannot get comfortable.  The pain lasts for several minutes.  She has not found anything that provides relief for the flank pain.  She does have a history of nephrolithiasis, and GU surgery.  She has had bladder tacking in 1975 and 1995.   She had rectocele repair and bladder reconstruction 1996.  Reviewing her records,  she has had two documented infections.  One for e.coli and one for proteus with pan sensitivities.  She is not sexually active.  She is post menopausal.  She admits to diarrhea.  She does engage in good perineal hygiene.  She does not take tub baths.  She has incontinence.  She is using incontinence pads. She wears two thick pads daily, sometimes wetting through the pads onto her clothes.  She underwent a CT with contrast in 03/2016 which demonstrated a reduction in size of the right ovarian cyst as described.  Nonobstructing left renal calculus increased in size from the prior exam.  Fatty liver.  Hepatic and renal cysts which are stable.  Diverticulosis without diverticulitis.  She is drinking adequate amount of water daily.  Her PVR at her last  exam was 0 mL.      Contraindications present for PTNS      Pacemaker- YES (cardiac clearance is obtained)      Implantable defibrillator- NO       History of abnormal bleeding - NO      History of neuropathies or nerve damage - NO   Today, she is experiencing 7 day time voids (stable), 2 night time voids (stable)  and 0 incontinence episodes a day (improvement).  Her urgency is mild.  She denies dysuria, gross hematuria and suprapubic pain.  She has not had recent fevers, chills, nausea or vomiting.  She feels that the PTNS treatments are providing benefit.    Patient goals: She would like to reduce her urgency, frequency, no accidents, sleep through the night and voiding every two hours.     PMH: Past Medical History:  Diagnosis Date  . Allergy   . Anemia   . Arthritis   . Atrial fibrillation ([place])   . CAD (coronary artery disease)   . Cancer ([place])    H/O skin cancer, squamous  . Diabetes ([place])   . Heart attack   . Heart murmur   . History of sick sinus syndrome    s/p pacemaker placement  . Hyperlipidemia   . Macular degeneration   . Neuropathy ([place])   . Neuropathy ([place])   . Pacemaker   . Stroke ([place])   . TIA (transient  ischemic attack)     Surgical History: Past Surgical History:  Procedure Laterality Date  . ABDOMINAL HYSTERECTOMY    . APPENDECTOMY    . [place]  . BREAST SURGERY    . CARDIAC CATHETERIZATION  1989  . CATARACT EXTRACTION  1997 and 1998  . COLONOSCOPY WITH PROPOFOL N/A 09/12/2015   Procedure: COLONOSCOPY WITH PROPOFOL;  Surgeon: [name], MD;  Location: [facility] ENDOSCOPY;  Service: Endoscopy;  Laterality: N/A;  . pace maker    . [place]  . TONSILLECTOMY AND ADENOIDECTOMY      Home Medications:    Medication List       Accurate as of 07/20/16  3:11 PM. Always use your most recent med list.          Aflibercept 2 MG/0.05ML Soln   aspirin 81 MG tablet Take 81 mg by mouth daily.   blood  glucose meter kit and supplies Per patient please dispense One touch Ultra2 meter.Use meter and supplies three times a day to check blood sugar. ICD10: E11.40   conjugated estrogens vaginal cream Commonly known as:  PREMARIN Place 1 Applicatorful vaginally daily. Apply 0.55m (pea-sized amount)  just inside the vaginal introitus with a finger-tip every night for two weeks and then Monday, Wednesday and Friday nights.   dapagliflozin propanediol 5 MG Tabs tablet Commonly known as:  FARXIGA Take 5 mg by mouth.   digoxin 0.125 MG tablet Commonly known as:  LANOXIN   estradiol 0.1 MG/GM vaginal cream Commonly known as:  ESTRACE VAGINAL Apply 0.527m(pea-sized amount)  just inside the vaginal introitus with a finger-tip every night for two weeks and then Monday, Wednesday and Friday nights.   furosemide 20 MG tablet Commonly known as:  LASIX Take 20 mg by mouth.   glucose blood test strip Use three times a day to check blood sugar. Please dispense One Touch Ultra 2 strips to go with meter. ICD-10:E11.40   guaiFENesin 200 MG tablet Take 400 mg by mouth.   JANUVIA 100 MG tablet Generic drug:  sitaGLIPtin TAKE ONE TABLET BY MOUTH ONCE DAILY   MAGNESIUM SULFATE PO Take by mouth.   metFORMIN 500 MG 24 hr tablet Commonly known as:  GLUCOPHAGE XR Take two tablets bid   metoprolol 50 MG tablet Commonly known as:  LOPRESSOR Take 50 mg by mouth 2 (two) times daily.   PRESERVISION AREDS 2 PO Take by mouth.   PROBIOTIC ACIDOPHILUS PO Take by mouth.   vitamin B-12 1000 MCG tablet Commonly known as:  CYANOCOBALAMIN Take 1,000 mcg by mouth daily.       Allergies:  Allergies  Allergen Reactions  . Lyrica [Pregabalin] Other (See Comments) and Swelling    Other reaction(s): SWELLING/EDEMA Other reaction(s): SWELLING/EDEMA Sleepy, does not eat  . Neurontin [Gabapentin] Other (See Comments) and Nausea And Vomiting    Other reaction(s): Other (See Comments) Out of it Other  reaction(s): Other (See Comments) Out of it Other reaction(s): OTHER Out of it  . Bactrim [Sulfamethoxazole-Trimethoprim]     Made her dizziness    Family History: Family History  Problem Relation Age of Onset  . Stroke Mother   . Diabetes Mother   . Cancer Maternal Aunt     Breast Cancer  . Arthritis Maternal Grandmother   . Cancer Maternal Aunt     Lung Cancer  . Kidney disease Neg Hx   . Bladder Cancer Neg Hx  Social History:  reports that she has quit smoking. She has never used smokeless tobacco. She reports that she does not drink alcohol or use drugs.  ROS: UROLOGY Frequent Urination?: No Hard to postpone urination?: No Burning/pain with urination?: No Get up at night to urinate?: Yes Leakage of urine?: No Urine stream starts and stops?: No Trouble starting stream?: No Do you have to strain to urinate?: No Blood in urine?: No Urinary tract infection?: No Sexually transmitted disease?: No Injury to kidneys or bladder?: No Painful intercourse?: No Weak stream?: No Currently pregnant?: No Vaginal bleeding?: No Last menstrual period?: n  Gastrointestinal Nausea?: No Vomiting?: No Indigestion/heartburn?: No Diarrhea?: No Constipation?: No  Constitutional Fever: No Night sweats?: No Weight loss?: No Fatigue?: No  Skin Skin rash/lesions?: No Itching?: No  Eyes Blurred vision?: No Double vision?: No  Ears/Nose/Throat Sore throat?: No Sinus problems?: No  Hematologic/Lymphatic Swollen glands?: No Easy bruising?: No  Cardiovascular Leg swelling?: No Chest pain?: No  Respiratory Cough?: No Shortness of breath?: No  Endocrine Excessive thirst?: No  Musculoskeletal Back pain?: No Joint pain?: No  Neurological Headaches?: No Dizziness?: No  Psychologic Depression?: No Anxiety?: No  Physical Exam: BP (!) 148/81   Pulse 93   Ht 5' 2.5" (1.588 m)   Wt 165 lb 11.2 oz (75.2 kg)   BMI 29.82 kg/m   Constitutional: Well  nourished. Alert and oriented, No acute distress. HEENT:  AT, moist mucus membranes. Trachea midline, no masses. Cardiovascular: No clubbing, cyanosis, or edema. Skin: No rashes, bruises or suspicious lesions. Lymph: No cervical or inguinal adenopathy. Neurologic: Grossly intact, no focal deficits, moving all 4 extremities. Psychiatric: Normal mood and affect.  Laboratory Data: Lab Results  Component Value Date   WBC 6.3 10/01/2015   HGB 13.9 10/01/2015   HCT 44 10/01/2015   MCV 88.0 08/13/2015   PLT 182 10/01/2015    Lab Results  Component Value Date   CREATININE 0.71 03/09/2016    Lab Results  Component Value Date   HGBA1C 10.2 (H) 03/09/2016    Lab Results  Component Value Date   TSH 3.22 03/09/2016       Component Value Date/Time   CHOL 211 (H) 03/09/2016 0923   HDL 58.40 03/09/2016 0923   CHOLHDL 4 03/09/2016 0923   VLDL 25.8 03/09/2016 0923   LDLCALC 127 (H) 03/09/2016 0923    Lab Results  Component Value Date   AST 22 03/09/2016   Lab Results  Component Value Date   ALT 23 03/09/2016   PTNS treatment:  The needle electrode was inserted into the lower, inner aspect of the patient's right leg. The surface electrode  was  placed on the inside arch of the foot on the treatment leg. The lead set was connected to the stimulator and the needle  electrode clip was connected to the needle electrode. The stimulator that produces an adjustable electrical pulse that  travels  to the sacral nerve plexus via the tibial nerve was increased to 12 until the patient received a sensory response.    1. Urge incontinence   Treatment Plan:    The needle electrode was removed without difficulty to the patient.  Patient tolerated the procedure for 30   minutes.   She will return next week for # 6 out of 12 of their weekly PTNS treatment's  Return in about 1 week (around 07/27/2016) for # 7 PTNS.  These notes generated with voice  recognition software. I apologize for typographical errors.  Zara Council, [facility] [address] [place], [place] [address] [phone]

## 2016-07-27 ENCOUNTER — Ambulatory Visit: Payer: Medicare Other | Admitting: Urology

## 2016-07-30 DIAGNOSIS — H353231 Exudative age-related macular degeneration, bilateral, with active choroidal neovascularization: Secondary | ICD-10-CM | POA: Diagnosis not present

## 2016-08-03 ENCOUNTER — Ambulatory Visit: Payer: Medicare Other | Admitting: Urology

## 2016-08-03 ENCOUNTER — Other Ambulatory Visit: Payer: Medicare Other

## 2016-08-03 NOTE — Addendum Note (Signed)
Addended by: Leeanne Rio on: 08/03/2016 04:00 PM   Modules accepted: Orders

## 2016-08-05 ENCOUNTER — Ambulatory Visit (INDEPENDENT_AMBULATORY_CARE_PROVIDER_SITE_OTHER): Payer: Medicare Other | Admitting: Internal Medicine

## 2016-08-05 ENCOUNTER — Encounter: Payer: Self-pay | Admitting: Internal Medicine

## 2016-08-05 ENCOUNTER — Other Ambulatory Visit: Payer: Medicare Other

## 2016-08-05 DIAGNOSIS — E7800 Pure hypercholesterolemia, unspecified: Secondary | ICD-10-CM

## 2016-08-05 DIAGNOSIS — I2510 Atherosclerotic heart disease of native coronary artery without angina pectoris: Secondary | ICD-10-CM | POA: Diagnosis not present

## 2016-08-05 DIAGNOSIS — H353231 Exudative age-related macular degeneration, bilateral, with active choroidal neovascularization: Secondary | ICD-10-CM | POA: Diagnosis not present

## 2016-08-05 DIAGNOSIS — E1140 Type 2 diabetes mellitus with diabetic neuropathy, unspecified: Secondary | ICD-10-CM

## 2016-08-05 DIAGNOSIS — Z86010 Personal history of colonic polyps: Secondary | ICD-10-CM | POA: Diagnosis not present

## 2016-08-05 DIAGNOSIS — F439 Reaction to severe stress, unspecified: Secondary | ICD-10-CM | POA: Diagnosis not present

## 2016-08-05 DIAGNOSIS — D509 Iron deficiency anemia, unspecified: Secondary | ICD-10-CM

## 2016-08-05 DIAGNOSIS — R197 Diarrhea, unspecified: Secondary | ICD-10-CM

## 2016-08-05 DIAGNOSIS — I4891 Unspecified atrial fibrillation: Secondary | ICD-10-CM

## 2016-08-05 DIAGNOSIS — E1142 Type 2 diabetes mellitus with diabetic polyneuropathy: Secondary | ICD-10-CM

## 2016-08-05 DIAGNOSIS — R1084 Generalized abdominal pain: Secondary | ICD-10-CM

## 2016-08-05 NOTE — Addendum Note (Signed)
Addended by: Frutoso Chase A on: 08/05/2016 10:23 AM   Modules accepted: Orders

## 2016-08-05 NOTE — Progress Notes (Signed)
Patient ID: [name], female   DOB: 17-Jul-1930, 82 y.o.   MRN: [id number]   Subjective:    Patient ID: [name], female    DOB: [birth year], 82 y.o.   MRN: [id number]  HPI  Patient here for a scheduled follow up.  She is accompanied by her caretaker.  History obtained from both of them.  She is under increased stress.  Her son has been diagnosed with metastatic lung cancer.  She feels she is handling things ok.  Her sugars have been elevated.  Brought in no recorded sugar readings.  States am sugars in the low 200 range.  No chest pain.  Breathing stable.  Was seeing urology for bladder issues.  Does not feel this is helping a lot.  Not sure if going to continue.  Eating.  No nausea or vomiting.  Bowels moving.  No significant abdominal pain or cramping.  Occasionally will notice some back discomfort.  Will just catch at times.  Desires no further w/up or intervention.  Unable to get her labs here.  Plans to get next week at [facility].  Discussed her neuropathy.  She has not started taking alpha lipoic acid.  Plans to start.  See last note.     Past Medical History:  Diagnosis Date  . Allergy   . Anemia   . Arthritis   . Atrial fibrillation ([place])   . CAD (coronary artery disease)   . Cancer ([place])    H/O skin cancer, squamous  . Diabetes ([place])   . Heart attack   . Heart murmur   . History of sick sinus syndrome    s/p pacemaker placement  . Hyperlipidemia   . Macular degeneration   . Neuropathy ([place])   . Neuropathy ([place])   . Pacemaker   . Stroke ([place])   . TIA (transient ischemic attack)    Past Surgical History:  Procedure Laterality Date  . ABDOMINAL HYSTERECTOMY    . APPENDECTOMY    . [place]  . BREAST SURGERY    . CARDIAC CATHETERIZATION  1989  . CATARACT EXTRACTION  1997 and 1998  . COLONOSCOPY WITH PROPOFOL N/A 09/12/2015   Procedure: COLONOSCOPY WITH PROPOFOL;  Surgeon: [name], MD;  Location: [facility] ENDOSCOPY;  Service:  Endoscopy;  Laterality: N/A;  . pace maker    . [place]  . TONSILLECTOMY AND ADENOIDECTOMY     Family History  Problem Relation Age of Onset  . Stroke Mother   . Diabetes Mother   . Cancer Maternal Aunt     Breast Cancer  . Arthritis Maternal Grandmother   . Cancer Maternal Aunt     Lung Cancer  . Kidney disease Neg Hx   . Bladder Cancer Neg Hx    Social History   Social History  . Marital status: Single    Spouse name: N/A  . Number of children: N/A  . Years of education: N/A   Social History Main Topics  . Smoking status: Former Research scientist (life sciences)  . Smokeless tobacco: Never Used     Comment: quit 1970  . Alcohol use No  . Drug use: No  . Sexual activity: Not Asked   Other Topics Concern  . None   Social History Narrative  . None    Outpatient Encounter Prescriptions as of 08/05/2016  Medication Sig  . Aflibercept 2 MG/0.05ML SOLN   . aspirin 81 MG tablet Take 81 mg  by mouth daily.  . blood glucose meter kit and supplies Per patient please dispense One touch Ultra2 meter.Use meter and supplies three times a day to check blood sugar. ICD10: E11.40  . conjugated estrogens (PREMARIN) vaginal cream Place 1 Applicatorful vaginally daily. Apply 0.63m (pea-sized amount)  just inside the vaginal introitus with a finger-tip every night for two weeks and then Monday, Wednesday and Friday nights.  . dapagliflozin propanediol (FARXIGA) 5 MG TABS tablet Take 5 mg by mouth.  . digoxin (LANOXIN) 0.125 MG tablet   . estradiol (ESTRACE VAGINAL) 0.1 MG/GM vaginal cream Apply 0.563m(pea-sized amount)  just inside the vaginal introitus with a finger-tip every night for two weeks and then Monday, Wednesday and Friday nights.  . furosemide (LASIX) 20 MG tablet Take 20 mg by mouth.  . Marland Kitchenlucose blood test strip Use three times a day to check blood sugar. Please dispense One Touch Ultra 2 strips to go with meter. ICD-10:E11.40  . guaiFENesin 200 MG tablet Take 400 mg by mouth.  . Marland KitchenJANUVIA 100 MG tablet TAKE ONE TABLET BY MOUTH ONCE DAILY  . Lactobacillus (PROBIOTIC ACIDOPHILUS PO) Take by mouth.  . Marland KitchenAGNESIUM SULFATE PO Take by mouth.  . metFORMIN (GLUCOPHAGE XR) 500 MG 24 hr tablet Take two tablets bid  . metoprolol (LOPRESSOR) 50 MG tablet Take 50 mg by mouth 2 (two) times daily.  . Multiple Vitamins-Minerals (PRESERVISION AREDS 2 PO) Take by mouth.  . vitamin B-12 (CYANOCOBALAMIN) 1000 MCG tablet Take 1,000 mcg by mouth daily.   No facility-administered encounter medications on file as of 08/05/2016.     Review of Systems  Constitutional: Negative for appetite change and unexpected weight change.  HENT: Negative for congestion and sinus pressure.   Respiratory: Negative for cough, chest tightness and shortness of breath.   Cardiovascular: Negative for chest pain, palpitations and leg swelling.  Gastrointestinal: Negative for abdominal pain, nausea and vomiting.       Still some diarrhea.  Intermittent.   Genitourinary: Negative for dysuria and vaginal pain.  Musculoskeletal: Negative for myalgias.       Occasional back pain as outlined.    Skin: Negative for color change and rash.  Neurological: Negative for dizziness, light-headedness and headaches.  Psychiatric/Behavioral: Negative for agitation and dysphoric mood.       Increased stress as outlined.         Objective:    Physical Exam  Constitutional: She appears well-developed and well-nourished. No distress.  HENT:  Nose: Nose normal.  Mouth/Throat: Oropharynx is clear and moist.  Neck: Neck supple. No thyromegaly present.  Cardiovascular: Normal rate and regular rhythm.   Pulmonary/Chest: Breath sounds normal. No respiratory distress. She has no wheezes.  Abdominal: Soft. Bowel sounds are normal.  Minimal tenderness to palpation lower abdomen.  No significant pain.    Musculoskeletal: She exhibits no edema or tenderness.  Lymphadenopathy:    She has no cervical adenopathy.  Skin: No rash  noted. No erythema.  Psychiatric: She has a normal mood and affect. Her behavior is normal.    BP 140/80   Pulse 80   Temp 97.6 F (36.4 C) (Oral)   Ht 5' 3" (1.6 m)   Wt 163 lb 9.6 oz (74.2 kg)   SpO2 95%   BMI 28.98 kg/m  Wt Readings from Last 3 Encounters:  08/05/16 163 lb 9.6 oz (74.2 kg)  07/20/16 165 lb 11.2 oz (75.2 kg)  07/13/16 163 lb 8 oz (74.2 kg)  Lab Results  Component Value Date   WBC 6.3 10/01/2015   HGB 13.9 10/01/2015   HCT 44 10/01/2015   PLT 182 10/01/2015   GLUCOSE 293 (H) 03/09/2016   CHOL 211 (H) 03/09/2016   TRIG 129.0 03/09/2016   HDL 58.40 03/09/2016   LDLCALC 127 (H) 03/09/2016   ALT 23 03/09/2016   AST 22 03/09/2016   NA 136 03/09/2016   K 4.5 03/09/2016   CL 102 03/09/2016   CREATININE 0.71 03/09/2016   BUN 13 03/09/2016   CO2 23 03/09/2016   TSH 3.22 03/09/2016   HGBA1C 10.2 (H) 03/09/2016   MICROALBUR 65.4 (H) 03/09/2016    Dg Bone Density  Result Date: 07/14/2016 EXAM: DUAL X-RAY ABSORPTIOMETRY (DXA) FOR BONE MINERAL DENSITY IMPRESSION: Dear Dr. [name], Your patient [name] completed a FRAX assessment on 07/14/2016 using the [place] (analysis version: 14.10) manufactured by [employer]. The following summarizes the results of our evaluation. PATIENT BIOGRAPHICAL: Name: Nirvi, Boehler Patient ID: [id number] Birth Date: Dec 03, 1929 Height:    62.5 in. Gender:     Female    [age]       Weight:    163.5 lbs. Ethnicity:  White                            Exam Date: 07/14/2016 FRAX* RESULTS:  (version: 3.5) 10-year Probability of Fracture1 Major Osteoporotic Fracture2 Hip Fracture 17.0% 5.6% Population: [place] (Caucasian) Risk Factors: None Based on Femur (Left) Neck BMD 1 -The 10-year probability of fracture may be lower than reported if the patient has received treatment. 2 -Major Osteoporotic Fracture: Clinical Spine, Forearm, Hip or Shoulder *FRAX is a Materials engineer of the [employer] of [employer] for Metabolic Bone Disease, a [place] (WHO) [facility]. ASSESSMENT: The probability of a major osteoporotic fracture is 17.0% within the next ten years. The probability of a hip fracture is 5.6% within the next ten years. . Dear Dr. [name], Your patient Mellody Masri completed a BMD test on 07/14/2016 using the [place] (analysis version: 14.10) manufactured by [employer]. The following summarizes the results of our evaluation. PATIENT BIOGRAPHICAL: Name: Lizvette, Lightsey Patient ID: [id number] Birth Date: [birth year] Height: 62.5 in. Gender: Female Exam Date: 07/14/2016 Weight: 163.5 lbs. Indications: Advanced Age, Caucasian, Hysterectomy, Postmenopausal Fractures: Treatments: Estrace, Multi-Vitamin with calcium ASSESSMENT: The BMD measured at Femur Neck Left is 0.726 g/cm2 with a T-score of -2.2. This patient is considered osteopenic according to [facility]) criteria. Site Region Measured Measured WHO Young Adult BMD Date       Age      Classification T-score AP Spine L1-L4 07[age]14/2016 [age] Osteopenia -1.1 1.054 g/cm2 DualFemur Neck Left 07/14/2016 84.6 Osteopenia -2.2 0.726 g/cm2 World Health Organization [employer]) criteria for post-menopausal, Caucasian Women: Normal:       T-score at or above -1 SD Osteopenia:   T-score between -1 and -2.5 SD Osteoporosis: T-score at or below -2.5 SD RECOMMENDATIONS: [place] recommends that FDA-approved medical therapies be considered in postmenopausal women and men age 68 or older with a: 1. Hip or vertebral (clinical or morphometric) fracture. 2. T-score of < -2.5 at the spine or hip. 3. Ten-year fracture probability by FRAX of 3% or greater for hip fracture or 20% or greater for major osteoporotic fracture. All treatment decisions require clinical judgment  and consideration of individual patient factors, including patient preferences, co-morbidities, previous  drug use, risk factors not captured in the FRAX model (e.g. falls, vitamin D deficiency, increased bone turnover, interval significant decline in bone density) and possible under - or over-estimation of fracture risk by FRAX. All patients should ensure an adequate intake of dietary calcium (1200 mg/d) and vitamin D (800 IU daily) unless contraindicated. FOLLOW-UP: People with diagnosed cases of osteoporosis or at high risk for fracture should have regular bone mineral density tests. For patients eligible for Medicare, routine testing is allowed once every 2 years. The testing frequency can be increased to one year for patients who have rapidly progressing disease, those who are receiving or discontinuing medical therapy to restore bone mass, or have additional risk factors. I have reviewed this report, and agree with the above findings. [facility] Radiology Electronically Signed   By: Lowella Grip III M.D.   On: 07/14/2016 11:36       Assessment & Plan:   Problem List Items Addressed This Visit    A-fib [facility])    Has been followed by Dr Ubaldo Glassing.  Pacemaker in place.  Stable.        Abdominal pain    Had previous CT scan and pelvic ultrasound.  Ultrasound revealed right ovarian cyst.  Saw gyn.  Cyst decreased in size.  Referred to urology.  Has kidney stone.  Currently stable.  Desires no further intervention.  Follow.        CAD (coronary artery disease)    Has been followed by cardiology.  Stable.       Diabetes mellitus with neuropathy (HCC)    Low carb diet and exercise as tolerated.  Brought in no recorded sugar readings.  Check metabolic panel and G9E.  Up to date with eye checks.        Diabetic neuropathy ([place])    Have discussed treatment options.  See last note.  Will start alpha lipoic acid.  Follow.        Diarrhea    On a probiotic.  Diarrhea overall she had reported was better.  Still flares intermittently.  Follow.        History of colonic polyps    Colonoscopy 09/12/15  - as outlined.        Hypercholesterolemia    Low cholesterol diet and exercise.  Follow lipid panel.        Iron deficiency anemia    Has been followed by Dr Gaylyn Cheers.  Wants cbc's followed here now.  Check cbc with next lab.       Stress    Increased stress as outlined.  Overall feels she is handling things relatively well.  Follow.            [name], MD

## 2016-08-05 NOTE — Progress Notes (Signed)
Pre visit review using our clinic review tool, if applicable. No additional management support is needed unless otherwise documented below in the visit note. 

## 2016-08-05 NOTE — Addendum Note (Signed)
Addended by: Frutoso Chase A on: 08/05/2016 04:54 PM   Modules accepted: Orders

## 2016-08-10 ENCOUNTER — Ambulatory Visit: Payer: Medicare Other | Admitting: Urology

## 2016-08-10 ENCOUNTER — Other Ambulatory Visit: Payer: Medicare Other

## 2016-08-10 ENCOUNTER — Encounter: Payer: Self-pay | Admitting: Internal Medicine

## 2016-08-10 ENCOUNTER — Telehealth: Payer: Self-pay | Admitting: Internal Medicine

## 2016-08-10 DIAGNOSIS — S50862A Insect bite (nonvenomous) of left forearm, initial encounter: Secondary | ICD-10-CM | POA: Diagnosis not present

## 2016-08-10 DIAGNOSIS — L0390 Cellulitis, unspecified: Secondary | ICD-10-CM | POA: Diagnosis not present

## 2016-08-10 DIAGNOSIS — W57XXXA Bitten or stung by nonvenomous insect and other nonvenomous arthropods, initial encounter: Secondary | ICD-10-CM | POA: Diagnosis not present

## 2016-08-10 NOTE — Assessment & Plan Note (Signed)
Increased stress as outlined.  Overall feels she is handling things relatively well.  Follow.

## 2016-08-10 NOTE — Assessment & Plan Note (Signed)
Low carb diet and exercise as tolerated.  Brought in no recorded sugar readings.  Check metabolic panel and [id number].  Up to date with eye checks.

## 2016-08-10 NOTE — Assessment & Plan Note (Signed)
On a probiotic.  Diarrhea overall she had reported was better.  Still flares intermittently.  Follow.

## 2016-08-10 NOTE — Assessment & Plan Note (Signed)
Has been followed by Dr Ubaldo Glassing.  Pacemaker in place.  Stable.

## 2016-08-10 NOTE — Assessment & Plan Note (Signed)
Have discussed treatment options.  See last note.  Will start alpha lipoic acid.  Follow.

## 2016-08-10 NOTE — Assessment & Plan Note (Signed)
Colonoscopy 09/12/15 - as outlined.

## 2016-08-10 NOTE — Assessment & Plan Note (Signed)
Low cholesterol diet and exercise.  Follow lipid panel.   

## 2016-08-10 NOTE — Telephone Encounter (Signed)
Pt daughter in law called and stated that pt has a possible spider bit and it is swollen, red, and puss is coming out. We have no appts available today. Please advise, thank you!  Call [name] @ [phone]

## 2016-08-10 NOTE — Assessment & Plan Note (Signed)
Has been followed by cardiology.  Stable.  

## 2016-08-10 NOTE — Assessment & Plan Note (Signed)
Has been followed by Dr Gaylyn Cheers.  Wants cbc's followed here now.  Check cbc with next lab.

## 2016-08-10 NOTE — Telephone Encounter (Signed)
Spoke with [name] and states patient was seen by [facility] and she was treated for spider bite and was put on antibiotic.

## 2016-08-10 NOTE — Assessment & Plan Note (Signed)
Had previous CT scan and pelvic ultrasound.  Ultrasound revealed right ovarian cyst.  Saw gyn.  Cyst decreased in size.  Referred to urology.  Has kidney stone.  Currently stable.  Desires no further intervention.  Follow.

## 2016-08-13 ENCOUNTER — Telehealth: Payer: Self-pay | Admitting: *Deleted

## 2016-08-13 NOTE — Telephone Encounter (Signed)
Pt wold like a office visit, pt thinks she could have been bit by a spider. She went to [place] [facility], she was given an antibiotic and topical cream. The spot is on the top of her left arm, and now has started draining. Please give a time and date to place pt.  Pt contact [phone]

## 2016-08-13 NOTE — Telephone Encounter (Signed)
I spoke with the patient.  She wanted to follow up with you.  She is still taking the Doxcycline.  It seems to be helping as long as she takes the medication with food.  She states that the site is still about the same.  It is now draining a bloody drainage on the bandage and it red around it still.  She wanted to make sure that is okay.  I advised that we had no appts today, but she could be seen tomorrow by another Provider, she declined as she is not available.  She requested an appt on Monday, I gave her one with NP Arnett, but advised that if the pain or drainage changes to seek medical attention at a urgent care/ED over the next few days.  She agreed.

## 2016-08-13 NOTE — Telephone Encounter (Signed)
Please call her.  She was seen 08/10/16 for bite.  Placed on doxycycline.  See if she is worsening.  If so, does need reevaluation.  I am unable to see her today - can see if someone has opening today (or tomorrow if able to wait).

## 2016-08-13 NOTE — Telephone Encounter (Signed)
Please advise, does she need office visit?

## 2016-08-17 ENCOUNTER — Ambulatory Visit: Payer: Medicare Other | Admitting: Urology

## 2016-08-17 ENCOUNTER — Ambulatory Visit (INDEPENDENT_AMBULATORY_CARE_PROVIDER_SITE_OTHER): Payer: Medicare Other | Admitting: Family

## 2016-08-17 ENCOUNTER — Encounter: Payer: Self-pay | Admitting: Family

## 2016-08-17 VITALS — BP 145/85 | HR 87 | Temp 97.5°F | Ht 63.0 in | Wt 164.6 lb

## 2016-08-17 DIAGNOSIS — I2510 Atherosclerotic heart disease of native coronary artery without angina pectoris: Secondary | ICD-10-CM | POA: Diagnosis not present

## 2016-08-17 DIAGNOSIS — T63301A Toxic effect of unspecified spider venom, accidental (unintentional), initial encounter: Secondary | ICD-10-CM

## 2016-08-17 NOTE — Progress Notes (Signed)
Pre visit review using our clinic review tool, if applicable. No additional management support is needed unless otherwise documented below in the visit note. 

## 2016-08-17 NOTE — Progress Notes (Signed)
Subjective:    Patient ID: Holly Rose, female    DOB: 1930/03/20, 82 y.o.   MRN: [id number]  CC: Holly Rose is a 82 y.o. female who presents today for an acute visit.    HPI: CC: left forearm. spider bite 11/27 and was started on antibiotic from walk in clinic. Has been on doxycycline and mupirocin topical with improvement. Draining clear to blood fluid.   No fever, chills.     HISTORY:  Past Medical History:  Diagnosis Date  . Allergy   . Anemia   . Arthritis   . Atrial fibrillation ([place])   . CAD (coronary artery disease)   . Cancer ([place])    H/O skin cancer, squamous  . Diabetes ([place])   . Heart attack   . Heart murmur   . History of sick sinus syndrome    s/p pacemaker placement  . Hyperlipidemia   . Macular degeneration   . Neuropathy ([place])   . Neuropathy ([place])   . Pacemaker   . Stroke ([place])   . TIA (transient ischemic attack)    Past Surgical History:  Procedure Laterality Date  . ABDOMINAL HYSTERECTOMY    . APPENDECTOMY    . [place]  . BREAST SURGERY    . CARDIAC CATHETERIZATION  1989  . CATARACT EXTRACTION  1997 and 1998  . COLONOSCOPY WITH PROPOFOL N/A 09/12/2015   Procedure: COLONOSCOPY WITH PROPOFOL;  Surgeon: [name], MD;  Location: [facility] ENDOSCOPY;  Service: Endoscopy;  Laterality: N/A;  . pace maker    . [place]  . TONSILLECTOMY AND ADENOIDECTOMY     Family History  Problem Relation Age of Onset  . Stroke Mother   . Diabetes Mother   . Cancer Maternal Aunt     Breast Cancer  . Arthritis Maternal Grandmother   . Cancer Maternal Aunt     Lung Cancer  . Kidney disease Neg Hx   . Bladder Cancer Neg Hx     Allergies: Lyrica [pregabalin]; Neurontin [gabapentin]; and Bactrim [sulfamethoxazole-trimethoprim] Current Outpatient Prescriptions on File Prior to Visit  Medication Sig Dispense Refill  . Aflibercept 2 MG/0.05ML SOLN     . aspirin 81 MG tablet Take 81 mg by mouth daily.    .  blood glucose meter kit and supplies Per patient please dispense One touch Ultra2 meter.Use meter and supplies three times a day to check blood sugar. ICD10: E11.40 1 each 0  . dapagliflozin propanediol (FARXIGA) 5 MG TABS tablet Take 5 mg by mouth.    . digoxin (LANOXIN) 0.125 MG tablet     . estradiol (ESTRACE VAGINAL) 0.1 MG/GM vaginal cream Apply 0.71m (pea-sized amount)  just inside the vaginal introitus with a finger-tip every night for two weeks and then Monday, Wednesday and Friday nights. 30 g 12  . furosemide (LASIX) 20 MG tablet Take 20 mg by mouth.    .Marland Kitchenglucose blood test strip Use three times a day to check blood sugar. Please dispense One Touch Ultra 2 strips to go with meter. ICD-10:E11.40 100 each 12  . Lactobacillus (PROBIOTIC ACIDOPHILUS PO) Take by mouth.    .Marland KitchenMAGNESIUM SULFATE PO Take by mouth.    . metFORMIN (GLUCOPHAGE XR) 500 MG 24 hr tablet Take two tablets bid 120 tablet 2  . metoprolol (LOPRESSOR) 50 MG tablet Take 50 mg by mouth 2 (two) times daily.    . Multiple Vitamins-Minerals (PRESERVISION AREDS 2 PO)  Take by mouth.    . vitamin B-12 (CYANOCOBALAMIN) 1000 MCG tablet Take 1,000 mcg by mouth daily.     No current facility-administered medications on file prior to visit.     Social History  Substance Use Topics  . Smoking status: Former Research scientist (life sciences)  . Smokeless tobacco: Never Used     Comment: quit 1970  . Alcohol use No    Review of Systems  Constitutional: Negative for chills and fever.  Respiratory: Negative for cough.   Cardiovascular: Negative for chest pain and palpitations.  Gastrointestinal: Negative for nausea and vomiting.  Skin: Positive for wound.      Objective:    BP (!) 145/85   Pulse 87   Temp 97.5 F (36.4 C) (Oral)   Ht 5' 3"  (1.6 m)   Wt 164 lb 9.6 oz (74.7 kg)   SpO2 96%   BMI 29.16 kg/m    Physical Exam  Constitutional: She appears well-developed and well-nourished.  Eyes: Conjunctivae are normal.  Cardiovascular: Normal  rate, regular rhythm, normal heart sounds and normal pulses.   Pulmonary/Chest: Effort normal and breath sounds normal. She has no wheezes. She has no rhonchi. She has no rales.  Neurological: She is alert.  Skin: Skin is warm and dry.     approx 3 cm circular erythematous wound. No streaking, drainage or warmth.   Psychiatric: She has a normal mood and affect. Her speech is normal and behavior is normal. Thought content normal.  Vitals reviewed.      Assessment & Plan:   1. Spider bite wound, accidental or unintentional, initial encounter Please to see resolving. Advised to complete antibiotic. Return precautions given.     I have discontinued [name]'s guaiFENesin, conjugated estrogens, and JANUVIA. I am also having her maintain her digoxin, aspirin, furosemide, vitamin B-12, Multiple Vitamins-Minerals (PRESERVISION AREDS 2 PO), blood glucose meter kit and supplies, glucose blood, metoprolol, Aflibercept, dapagliflozin propanediol, Lactobacillus (PROBIOTIC ACIDOPHILUS PO), estradiol, metFORMIN, MAGNESIUM SULFATE PO, doxycycline, and mupirocin ointment.   Meds ordered this encounter  Medications  . doxycycline (VIBRAMYCIN) 100 MG capsule    Sig: Take by mouth.  . mupirocin ointment (BACTROBAN) 2 %    Sig: Apply topically.    Return precautions given.   Risks, benefits, and alternatives of the medications and treatment plan prescribed today were discussed, and patient expressed understanding.   Education regarding symptom management and diagnosis given to patient on AVS.  Continue to follow with [name], MD for routine health maintenance.   Jaynie A [name] and I agreed with plan.   Mable Paris, FNP

## 2016-08-17 NOTE — Patient Instructions (Addendum)
BP goal < 145/90. Please call if consistently higher than that.   Arm looks great ; finish antibiotic.   Make lab appt , fasting

## 2016-08-19 ENCOUNTER — Other Ambulatory Visit: Payer: BLUE CROSS/BLUE SHIELD

## 2016-08-19 ENCOUNTER — Telehealth: Payer: Self-pay | Admitting: Internal Medicine

## 2016-08-19 NOTE — Telephone Encounter (Signed)
The lab orders are already in the system for [facility].

## 2016-08-19 NOTE — Telephone Encounter (Signed)
Patient advised.

## 2016-08-19 NOTE — Telephone Encounter (Signed)
Please advise 

## 2016-08-19 NOTE — Telephone Encounter (Signed)
The patient was in the lab previously to have her labs drawn per the patient they were unable to get any blood . She is requesting orders to be sent to [facility] on [address]. She will go there to have them drawn . She does not want to go to [facility]. Please call the patient after labs have been sent to [facility].

## 2016-08-20 DIAGNOSIS — E1140 Type 2 diabetes mellitus with diabetic neuropathy, unspecified: Secondary | ICD-10-CM | POA: Diagnosis not present

## 2016-08-20 DIAGNOSIS — D509 Iron deficiency anemia, unspecified: Secondary | ICD-10-CM | POA: Diagnosis not present

## 2016-08-20 DIAGNOSIS — E7800 Pure hypercholesterolemia, unspecified: Secondary | ICD-10-CM | POA: Diagnosis not present

## 2016-08-20 DIAGNOSIS — I4891 Unspecified atrial fibrillation: Secondary | ICD-10-CM | POA: Diagnosis not present

## 2016-08-21 ENCOUNTER — Other Ambulatory Visit: Payer: Self-pay | Admitting: Internal Medicine

## 2016-08-21 LAB — CBC WITH DIFFERENTIAL/PLATELET
Basophils Absolute: 0 10*3/uL (ref 0.0–0.2)
Basos: 0 %
EOS (ABSOLUTE): 0.1 10*3/uL (ref 0.0–0.4)
Eos: 1 %
Hematocrit: 41.2 % (ref 34.0–46.6)
Hemoglobin: 13 g/dL (ref 11.1–15.9)
Immature Grans (Abs): 0 10*3/uL (ref 0.0–0.1)
Immature Granulocytes: 0 %
Lymphocytes Absolute: 1.6 10*3/uL (ref 0.7–3.1)
Lymphs: 29 %
MCH: 27.8 pg (ref 26.6–33.0)
MCHC: 31.6 g/dL (ref 31.5–35.7)
MCV: 88 fL (ref 79–97)
Monocytes Absolute: 0.5 10*3/uL (ref 0.1–0.9)
Monocytes: 9 %
Neutrophils Absolute: 3.4 10*3/uL (ref 1.4–7.0)
Neutrophils: 61 %
Platelets: 180 10*3/uL (ref 150–379)
RBC: 4.68 x10E6/uL (ref 3.77–5.28)
RDW: 14.5 % (ref 12.3–15.4)
WBC: 5.7 10*3/uL (ref 3.4–10.8)

## 2016-08-21 LAB — BASIC METABOLIC PANEL
BUN/Creatinine Ratio: 16 (ref 12–28)
BUN: 10 mg/dL (ref 8–27)
CO2: 22 mmol/L (ref 18–29)
Calcium: 10.5 mg/dL — ABNORMAL HIGH (ref 8.7–10.3)
Chloride: 97 mmol/L (ref 96–106)
Creatinine, Ser: 0.63 mg/dL (ref 0.57–1.00)
GFR calc Af Amer: 95 mL/min/{1.73_m2} (ref >59)
GFR calc non Af Amer: 83 mL/min/{1.73_m2} (ref >59)
Glucose: 244 mg/dL — ABNORMAL HIGH (ref 65–99)
Potassium: 4.3 mmol/L (ref 3.5–5.2)
Sodium: 135 mmol/L (ref 134–144)

## 2016-08-21 LAB — HEPATIC FUNCTION PANEL
ALT: 21 IU/L (ref 0–32)
AST: 20 IU/L (ref 0–40)
Albumin: 3.8 g/dL (ref 3.5–4.7)
Alkaline Phosphatase: 68 IU/L (ref 39–117)
Bilirubin Total: 0.5 mg/dL (ref 0.0–1.2)
Bilirubin, Direct: 0.17 mg/dL (ref 0.00–0.40)
Total Protein: 7.6 g/dL (ref 6.0–8.5)

## 2016-08-21 LAB — LIPID PANEL
Chol/HDL Ratio: 2.8 ratio units (ref 0.0–4.4)
Cholesterol, Total: 193 mg/dL (ref 100–199)
HDL: 68 mg/dL (ref >39)
LDL Calculated: 102 mg/dL — ABNORMAL HIGH (ref 0–99)
Triglycerides: 115 mg/dL (ref 0–149)
VLDL Cholesterol Cal: 23 mg/dL (ref 5–40)

## 2016-08-21 LAB — HEMOGLOBIN A1C
Est. average glucose Bld gHb Est-mCnc: 255 mg/dL
Hgb A1c MFr Bld: 10.5 % — ABNORMAL HIGH (ref 4.8–5.6)

## 2016-08-21 LAB — DIGOXIN LEVEL: Digoxin, Serum: <0.4 ng/mL — ABNORMAL LOW (ref 0.5–0.9)

## 2016-08-21 LAB — FERRITIN: Ferritin: 21 ng/mL (ref 15–150)

## 2016-08-24 ENCOUNTER — Ambulatory Visit: Payer: Medicare Other | Admitting: Urology

## 2016-08-24 ENCOUNTER — Other Ambulatory Visit: Payer: Self-pay | Admitting: Internal Medicine

## 2016-08-24 NOTE — Progress Notes (Signed)
Opened in areea.

## 2016-08-25 ENCOUNTER — Ambulatory Visit (INDEPENDENT_AMBULATORY_CARE_PROVIDER_SITE_OTHER): Payer: Medicare Other | Admitting: Podiatry

## 2016-08-25 ENCOUNTER — Encounter: Payer: Self-pay | Admitting: Podiatry

## 2016-08-25 DIAGNOSIS — M79676 Pain in unspecified toe(s): Secondary | ICD-10-CM

## 2016-08-25 DIAGNOSIS — Q828 Other specified congenital malformations of skin: Secondary | ICD-10-CM | POA: Diagnosis not present

## 2016-08-25 DIAGNOSIS — M79672 Pain in left foot: Secondary | ICD-10-CM

## 2016-08-25 DIAGNOSIS — E0843 Diabetes mellitus due to underlying condition with diabetic autonomic (poly)neuropathy: Secondary | ICD-10-CM

## 2016-08-25 DIAGNOSIS — L84 Corns and callosities: Secondary | ICD-10-CM

## 2016-08-25 DIAGNOSIS — B351 Tinea unguium: Secondary | ICD-10-CM | POA: Diagnosis not present

## 2016-08-25 DIAGNOSIS — L603 Nail dystrophy: Secondary | ICD-10-CM

## 2016-08-25 DIAGNOSIS — M79609 Pain in unspecified limb: Secondary | ICD-10-CM

## 2016-08-25 DIAGNOSIS — L851 Acquired keratosis [keratoderma] palmaris et plantaris: Secondary | ICD-10-CM

## 2016-08-25 DIAGNOSIS — L608 Other nail disorders: Secondary | ICD-10-CM

## 2016-08-25 DIAGNOSIS — M79671 Pain in right foot: Secondary | ICD-10-CM

## 2016-08-25 NOTE — Progress Notes (Signed)
SUBJECTIVE Patient with a history of diabetes mellitus presents to office today complaining of elongated, thickened nails. Pain while ambulating in shoes. Patient is unable to trim their own nails.  Patient also complains of painful callus lesions to the weightbearing surfaces of her bilateral feet.  Allergies  Allergen Reactions  . Lyrica [Pregabalin] Other (See Comments) and Swelling    Other reaction(s): SWELLING/EDEMA Other reaction(s): SWELLING/EDEMA Sleepy, does not eat  . Neurontin [Gabapentin] Other (See Comments) and Nausea And Vomiting    Other reaction(s): Other (See Comments) Out of it Other reaction(s): Other (See Comments) Out of it Other reaction(s): OTHER Out of it  . Bactrim [Sulfamethoxazole-Trimethoprim]     Made her dizziness    OBJECTIVE General Patient is awake, alert, and oriented x 3 and in no acute distress. Derm Painful hyperkeratotic callus lesions was noted to the bilateral weightbearing surfaces of the forefoot 5. Skin is dry and supple bilateral. Negative open lesions or macerations. Remaining integument unremarkable. Nails are tender, long, thickened and dystrophic with subungual debris, consistent with onychomycosis, 1-5 bilateral. No signs of infection noted. Vasc  DP and PT pedal pulses palpable bilaterally. Temperature gradient within normal limits.  Neuro Epicritic and protective threshold sensation diminished bilaterally.  Musculoskeletal Exam No symptomatic pedal deformities noted bilateral. Muscular strength within normal limits.  ASSESSMENT 1. Diabetes Mellitus w/ peripheral neuropathy 2. Onychomycosis of nail due to dermatophyte bilateral 3. Pain in foot bilateral 4. Porokeratosis bilateral forefeet 5  PLAN OF CARE 1. Patient evaluated today. 2. Instructed to maintain good pedal hygiene and foot care. Stressed importance of controlling blood sugar.  3. Mechanical debridement of nails 1-5 bilaterally performed using a nail nipper. Filed  with dremel without incident.  4. Excisional debridement of callus lesions was performed using a chisel blade without incident or bleeding. [id number] salicylic acid cream was applied with occlusive dressing. 5. Return to clinic in 3 mos.     Edrick Kins, DPM [facility]  Dr. Edrick Kins, [address]                                        [place], [place] [address]                Office [phone]  Fax [phone]

## 2016-08-26 ENCOUNTER — Other Ambulatory Visit: Payer: Self-pay

## 2016-08-26 ENCOUNTER — Telehealth: Payer: Self-pay | Admitting: Internal Medicine

## 2016-08-26 DIAGNOSIS — E1140 Type 2 diabetes mellitus with diabetic neuropathy, unspecified: Secondary | ICD-10-CM

## 2016-08-26 MED ORDER — GLUCOSE BLOOD VI STRP: ORAL_STRIP | 2 refills | Status: DC

## 2016-08-26 NOTE — Telephone Encounter (Signed)
Order placed for calcium recheck

## 2016-08-27 ENCOUNTER — Other Ambulatory Visit: Payer: Self-pay | Admitting: Internal Medicine

## 2016-08-27 DIAGNOSIS — E1140 Type 2 diabetes mellitus with diabetic neuropathy, unspecified: Secondary | ICD-10-CM

## 2016-08-27 NOTE — Progress Notes (Signed)
Order placed for endocrinology referral.  

## 2016-08-31 ENCOUNTER — Ambulatory Visit: Payer: Medicare Other | Admitting: Urology

## 2016-09-01 ENCOUNTER — Ambulatory Visit: Payer: Medicare Other | Admitting: Urology

## 2016-09-14 HISTORY — PX: KIDNEY STONE SURGERY: SHX686

## 2016-09-15 ENCOUNTER — Ambulatory Visit: Payer: Medicare Other | Admitting: Urology

## 2016-09-21 ENCOUNTER — Ambulatory Visit: Payer: Medicare Other | Admitting: Urology

## 2016-09-21 DIAGNOSIS — H353231 Exudative age-related macular degeneration, bilateral, with active choroidal neovascularization: Secondary | ICD-10-CM | POA: Diagnosis not present

## 2016-09-23 DIAGNOSIS — H353231 Exudative age-related macular degeneration, bilateral, with active choroidal neovascularization: Secondary | ICD-10-CM | POA: Diagnosis not present

## 2016-09-28 ENCOUNTER — Ambulatory Visit: Payer: Medicare Other | Admitting: Urology

## 2016-10-12 DIAGNOSIS — R300 Dysuria: Secondary | ICD-10-CM | POA: Diagnosis not present

## 2016-10-12 DIAGNOSIS — E210 Primary hyperparathyroidism: Secondary | ICD-10-CM | POA: Diagnosis not present

## 2016-10-12 DIAGNOSIS — Z794 Long term (current) use of insulin: Secondary | ICD-10-CM | POA: Diagnosis not present

## 2016-10-12 DIAGNOSIS — R8290 Unspecified abnormal findings in urine: Secondary | ICD-10-CM | POA: Diagnosis not present

## 2016-10-12 DIAGNOSIS — E1142 Type 2 diabetes mellitus with diabetic polyneuropathy: Secondary | ICD-10-CM | POA: Diagnosis not present

## 2016-10-13 ENCOUNTER — Encounter: Payer: Self-pay | Admitting: Internal Medicine

## 2016-10-13 DIAGNOSIS — I495 Sick sinus syndrome: Secondary | ICD-10-CM | POA: Diagnosis not present

## 2016-10-13 DIAGNOSIS — R8290 Unspecified abnormal findings in urine: Secondary | ICD-10-CM | POA: Diagnosis not present

## 2016-10-13 DIAGNOSIS — N200 Calculus of kidney: Secondary | ICD-10-CM

## 2016-10-13 DIAGNOSIS — Z87442 Personal history of urinary calculi: Secondary | ICD-10-CM

## 2016-10-16 DIAGNOSIS — E782 Mixed hyperlipidemia: Secondary | ICD-10-CM | POA: Diagnosis not present

## 2016-10-16 DIAGNOSIS — I341 Nonrheumatic mitral (valve) prolapse: Secondary | ICD-10-CM | POA: Diagnosis not present

## 2016-10-16 DIAGNOSIS — M19049 Primary osteoarthritis, unspecified hand: Secondary | ICD-10-CM | POA: Diagnosis not present

## 2016-10-16 DIAGNOSIS — I4891 Unspecified atrial fibrillation: Secondary | ICD-10-CM | POA: Diagnosis not present

## 2016-10-16 NOTE — Telephone Encounter (Signed)
Order placed for urology referral.  

## 2016-11-02 DIAGNOSIS — H353231 Exudative age-related macular degeneration, bilateral, with active choroidal neovascularization: Secondary | ICD-10-CM | POA: Diagnosis not present

## 2016-11-03 DIAGNOSIS — M159 Polyosteoarthritis, unspecified: Secondary | ICD-10-CM | POA: Insufficient documentation

## 2016-11-03 DIAGNOSIS — M79642 Pain in left hand: Secondary | ICD-10-CM | POA: Diagnosis not present

## 2016-11-03 DIAGNOSIS — M79641 Pain in right hand: Secondary | ICD-10-CM | POA: Diagnosis not present

## 2016-11-03 DIAGNOSIS — R202 Paresthesia of skin: Secondary | ICD-10-CM

## 2016-11-03 DIAGNOSIS — E1140 Type 2 diabetes mellitus with diabetic neuropathy, unspecified: Secondary | ICD-10-CM | POA: Diagnosis not present

## 2016-11-03 DIAGNOSIS — R200 Anesthesia of skin: Secondary | ICD-10-CM | POA: Diagnosis not present

## 2016-11-04 ENCOUNTER — Telehealth: Payer: Self-pay

## 2016-11-04 ENCOUNTER — Telehealth: Payer: Self-pay | Admitting: *Deleted

## 2016-11-04 NOTE — Telephone Encounter (Signed)
We received a e-mail message from patients daughter in law via her mychart:  Dr [name], [place] just called me and said she has found a lump in a breast. What should we do?     Ed was told by his oncologist Febuary 8 that his metastatic liver cancer has not responded to the chemo drugs so we decided to stop. Hospice has begun home visits so now we just treat his pain. I'm not sure how much more I can take on but there is no one else close to help Beila.     I take her to Dr Jacqlyn Larsen on Thursday afternoon. How soon can she be seen or scheduled for a mammogram?  Help!  [name]     I called patient and she states that the knot is on top middle of breast around nipple area. It is about the size of a dime. She is not having any d/c or change in nipple or pain/redness in the area. She does not remember when her last mammogram was.

## 2016-11-04 NOTE — Telephone Encounter (Signed)
See phone message had already spoke to pt this am.

## 2016-11-04 NOTE — Telephone Encounter (Signed)
Pt's daughter-in-law reported that pt found a knot in her breast(unspecified of which breast). Pt was not sure when she had her last mammogram, she also requested a time and date to come in to see Dr. [name] if necessary.  Please contact daughter-in-law [name] [phone]

## 2016-11-04 NOTE — Telephone Encounter (Signed)
She is going to need to be evaluated and then can schedule further testing needed.  I will be out of the office the rest of today and this week.  I can see her 11/10/16 at 4:00.  If more urgent, can see if someone else here can see her.  Thanks

## 2016-11-04 NOTE — Telephone Encounter (Signed)
Spoke to pt did not want to see another provider have made app for 2/27 she will call if any problems.

## 2016-11-05 DIAGNOSIS — N3020 Other chronic cystitis without hematuria: Secondary | ICD-10-CM | POA: Insufficient documentation

## 2016-11-05 DIAGNOSIS — N23 Unspecified renal colic: Secondary | ICD-10-CM | POA: Insufficient documentation

## 2016-11-05 DIAGNOSIS — N3941 Urge incontinence: Secondary | ICD-10-CM | POA: Diagnosis not present

## 2016-11-05 DIAGNOSIS — N200 Calculus of kidney: Secondary | ICD-10-CM | POA: Diagnosis not present

## 2016-11-05 DIAGNOSIS — M8588 Other specified disorders of bone density and structure, other site: Secondary | ICD-10-CM | POA: Diagnosis not present

## 2016-11-05 DIAGNOSIS — Z6831 Body mass index (BMI) 31.0-31.9, adult: Secondary | ICD-10-CM | POA: Diagnosis not present

## 2016-11-09 DIAGNOSIS — H353211 Exudative age-related macular degeneration, right eye, with active choroidal neovascularization: Secondary | ICD-10-CM | POA: Diagnosis not present

## 2016-11-10 ENCOUNTER — Encounter: Payer: Self-pay | Admitting: Internal Medicine

## 2016-11-10 ENCOUNTER — Ambulatory Visit: Payer: BLUE CROSS/BLUE SHIELD | Admitting: Dietician

## 2016-11-10 ENCOUNTER — Other Ambulatory Visit: Payer: Self-pay | Admitting: Internal Medicine

## 2016-11-10 ENCOUNTER — Ambulatory Visit (INDEPENDENT_AMBULATORY_CARE_PROVIDER_SITE_OTHER): Payer: Medicare Other | Admitting: Internal Medicine

## 2016-11-10 VITALS — BP 132/74 | HR 74 | Temp 98.6°F | Resp 16 | Ht 63.0 in | Wt 163.0 lb

## 2016-11-10 DIAGNOSIS — N6320 Unspecified lump in the left breast, unspecified quadrant: Secondary | ICD-10-CM | POA: Diagnosis not present

## 2016-11-10 DIAGNOSIS — E1140 Type 2 diabetes mellitus with diabetic neuropathy, unspecified: Secondary | ICD-10-CM

## 2016-11-10 DIAGNOSIS — Z1231 Encounter for screening mammogram for malignant neoplasm of breast: Secondary | ICD-10-CM

## 2016-11-10 DIAGNOSIS — N63 Unspecified lump in unspecified breast: Secondary | ICD-10-CM

## 2016-11-10 DIAGNOSIS — H353221 Exudative age-related macular degeneration, left eye, with active choroidal neovascularization: Secondary | ICD-10-CM | POA: Diagnosis not present

## 2016-11-10 DIAGNOSIS — Z1239 Encounter for other screening for malignant neoplasm of breast: Secondary | ICD-10-CM

## 2016-11-10 NOTE — Progress Notes (Signed)
Pre-visit discussion using our clinic review tool. No additional management support is needed unless otherwise documented below in the visit note.  

## 2016-11-10 NOTE — Progress Notes (Signed)
Patient ID: Holly Rose, female   DOB: 1930-05-25, 83 y.o.   MRN: [id number]   Subjective:    Patient ID: Holly Rose, female    DOB: [birth year], 83 y.o.   MRN: [id number]  HPI  Patient here as a work in with concerns regarding a lump in her left breast.  She first noticed over this past week.  States was in the shower and looked down and saw a change in her breast.  She is questioning if is bigger.  No pain.  No nipple discharge.  No redness or warmth.    Past Medical History:  Diagnosis Date  . Allergy   . Anemia   . Arthritis   . Atrial fibrillation ([place])   . CAD (coronary artery disease)   . Cancer ([place])    H/O skin cancer, squamous  . Diabetes ([place])   . Heart attack   . Heart murmur   . History of sick sinus syndrome    s/p pacemaker placement  . Hyperlipidemia   . Macular degeneration   . Neuropathy ([place])   . Neuropathy ([place])   . Pacemaker   . Stroke ([place])   . TIA (transient ischemic attack)    Past Surgical History:  Procedure Laterality Date  . ABDOMINAL HYSTERECTOMY    . APPENDECTOMY    . [place]  . BREAST SURGERY    . CARDIAC CATHETERIZATION  1989  . CATARACT EXTRACTION  1997 and 1998  . COLONOSCOPY WITH PROPOFOL N/A 09/12/2015   Procedure: COLONOSCOPY WITH PROPOFOL;  Surgeon: [name], MD;  Location: [facility] ENDOSCOPY;  Service: Endoscopy;  Laterality: N/A;  . pace maker    . [place]  . TONSILLECTOMY AND ADENOIDECTOMY     Family History  Problem Relation Age of Onset  . Stroke Mother   . Diabetes Mother   . Cancer Maternal Aunt     Breast Cancer  . Arthritis Maternal Grandmother   . Cancer Maternal Aunt     Lung Cancer  . Kidney disease Neg Hx   . Bladder Cancer Neg Hx    Social History   Social History  . Marital status: Single    Spouse name: N/A  . Number of children: N/A  . Years of education: N/A   Social History Main Topics  . Smoking status: Former Research scientist (life sciences)  . Smokeless tobacco:  Never Used     Comment: quit 1970  . Alcohol use No  . Drug use: No  . Sexual activity: Not Asked   Other Topics Concern  . None   Social History Narrative  . None    Outpatient Encounter Prescriptions as of 11/10/2016  Medication Sig  . Aflibercept 2 MG/0.05ML SOLN   . aspirin 81 MG tablet Take 81 mg by mouth daily.  . blood glucose meter kit and supplies Per patient please dispense One touch Ultra2 meter.Use meter and supplies three times a day to check blood sugar. ICD10: E11.40  . digoxin (LANOXIN) 0.125 MG tablet   . doxycycline (VIBRAMYCIN) 100 MG capsule Take by mouth.  . estradiol (ESTRACE VAGINAL) 0.1 MG/GM vaginal cream Apply 0.'5mg'$  (pea-sized amount)  just inside the vaginal introitus with a finger-tip every night for two weeks and then Monday, Wednesday and Friday nights.  . furosemide (LASIX) 20 MG tablet Take 20 mg by mouth.  Marland Kitchen glucose blood (ONE TOUCH ULTRA TEST) test strip USE ONE STRIP TO CHECK GLUCOSE THREE TIMES  DAILY  . Lactobacillus (PROBIOTIC ACIDOPHILUS PO) Take by mouth.  . liraglutide (VICTOZA) 18 MG/3ML SOPN Inject 18 mg into the skin daily.  Marland Kitchen MAGNESIUM SULFATE PO Take by mouth.  . metoprolol (LOPRESSOR) 50 MG tablet Take 50 mg by mouth 2 (two) times daily.  . Multiple Vitamins-Minerals (PRESERVISION AREDS 2 PO) Take by mouth.  . vitamin B-12 (CYANOCOBALAMIN) 1000 MCG tablet Take 1,000 mcg by mouth daily.  . [DISCONTINUED] metFORMIN (GLUCOPHAGE XR) 500 MG 24 hr tablet Take two tablets bid (Patient taking differently: 500 mg at bedtime. Take two tablets bid)  . [DISCONTINUED] dapagliflozin propanediol (FARXIGA) 5 MG TABS tablet Take 5 mg by mouth.  . [DISCONTINUED] mupirocin ointment (BACTROBAN) 2 % Apply topically.   No facility-administered encounter medications on file as of 11/10/2016.     Review of Systems  Constitutional: Negative for appetite change and unexpected weight change.  Cardiovascular: Negative for chest pain.  Gastrointestinal: Negative  for nausea and vomiting.  Skin: Negative for color change and rash.       Objective:    Physical Exam  Constitutional: She appears well-developed and well-nourished. No distress.  Neck: Neck supple.  Cardiovascular: Normal rate and regular rhythm.   Pulmonary/Chest: Breath sounds normal. No respiratory distress. She has no wheezes.  Breast exam:  Palpable left breast lump.  Non tender.  No nipple retraction or nipple discharge present.  Could no appreciate any distinct nodule or axillary adenopathy.    Abdominal: Soft. Bowel sounds are normal. There is no tenderness.  Lymphadenopathy:    She has no cervical adenopathy.    BP 132/74 (BP Location: Left Arm, Patient Position: Sitting, Cuff Size: Large)   Pulse 74   Temp 98.6 F (37 C) (Oral)   Resp 16   Ht 5' 3"  (1.6 m)   Wt 163 lb (73.9 kg)   SpO2 94%   BMI 28.87 kg/m  Wt Readings from Last 3 Encounters:  11/16/16 160 lb 6.4 oz (72.8 kg)  11/10/16 163 lb (73.9 kg)  08/17/16 164 lb 9.6 oz (74.7 kg)     Lab Results  Component Value Date   WBC 5.7 08/20/2016   HGB 13.9 10/01/2015   HCT 41.2 08/20/2016   PLT 180 08/20/2016   GLUCOSE 244 (H) 08/20/2016   CHOL 193 08/20/2016   TRIG 115 08/20/2016   HDL 68 08/20/2016   LDLCALC 102 (H) 08/20/2016   ALT 21 08/20/2016   AST 20 08/20/2016   NA 135 08/20/2016   K 4.3 08/20/2016   CL 97 08/20/2016   CREATININE 0.63 08/20/2016   BUN 10 08/20/2016   CO2 22 08/20/2016   TSH 3.22 03/09/2016   HGBA1C 10.5 (H) 08/20/2016   MICROALBUR 65.4 (H) 03/09/2016       Assessment & Plan:   Problem List Items Addressed This Visit    Diabetes mellitus with neuropathy [facility])    Seeing endocrinology now.        Relevant Medications   liraglutide (VICTOZA) 18 MG/3ML SOPN   Left breast lump    Schedule diagnostic mammogram and ultrasound.  She has declined recent mammograms.  Agreed today for scheduled exam.        Relevant Orders   US BREAST LTD UNI LEFT INC AXILLA   US BREAST  LTD UNI RIGHT INC AXILLA    Other Visit Diagnoses    Breast cancer screening    -  Primary   Breast mass in female       Relevant Orders  MM Digital Diagnostic Bilat       [name], MD

## 2016-11-11 NOTE — Telephone Encounter (Signed)
Should be now.  Let me know if a problem.

## 2016-11-11 NOTE — Telephone Encounter (Signed)
Pt is being seen at Endocrinology at [facility] should she be receiving refills from them?

## 2016-11-14 ENCOUNTER — Other Ambulatory Visit: Payer: Self-pay | Admitting: Internal Medicine

## 2016-11-16 ENCOUNTER — Ambulatory Visit (INDEPENDENT_AMBULATORY_CARE_PROVIDER_SITE_OTHER): Payer: Medicare Other | Admitting: Internal Medicine

## 2016-11-16 ENCOUNTER — Encounter: Payer: Self-pay | Admitting: Internal Medicine

## 2016-11-16 DIAGNOSIS — N6320 Unspecified lump in the left breast, unspecified quadrant: Secondary | ICD-10-CM

## 2016-11-16 DIAGNOSIS — F439 Reaction to severe stress, unspecified: Secondary | ICD-10-CM

## 2016-11-16 DIAGNOSIS — I2510 Atherosclerotic heart disease of native coronary artery without angina pectoris: Secondary | ICD-10-CM

## 2016-11-16 DIAGNOSIS — E7800 Pure hypercholesterolemia, unspecified: Secondary | ICD-10-CM

## 2016-11-16 DIAGNOSIS — I4891 Unspecified atrial fibrillation: Secondary | ICD-10-CM | POA: Diagnosis not present

## 2016-11-16 DIAGNOSIS — D509 Iron deficiency anemia, unspecified: Secondary | ICD-10-CM

## 2016-11-16 DIAGNOSIS — E1140 Type 2 diabetes mellitus with diabetic neuropathy, unspecified: Secondary | ICD-10-CM | POA: Diagnosis not present

## 2016-11-16 MED ORDER — METFORMIN HCL ER 500 MG PO TB24: 500.0000 mg | ORAL_TABLET | Freq: Every day | ORAL | 1 refills | Status: DC

## 2016-11-16 NOTE — Progress Notes (Signed)
Patient ID: Holly Rose, female   DOB: 13-Jan-1930, 83 y.o.   MRN: [id number]   Subjective:    Patient ID: Holly Rose, female    DOB: [birth year], 83 y.o.   MRN: [id number]  HPI  Patient here for a scheduled follow up.  She is accompanied by her daughter-n-law.  History obtained from both of them.  She was seen last week with breast nodule.  Waiting for mammogram.  Trying to get outside films.  Increased stress.  Son diagnosed with metastatic cancer.  Overall appears to be doing ok.  Seeing endocrinology now for her sugars.  Are better.  No chest pain.  Breathing stable.  No abdominal pain.  Bowels doing better.  Taking victoza.  No urine change.  Planning for urological procedure soon.    Past Medical History:  Diagnosis Date  . Allergy   . Anemia   . Arthritis   . Atrial fibrillation ([place])   . CAD (coronary artery disease)   . Cancer ([place])    H/O skin cancer, squamous  . Diabetes ([place])   . Heart attack   . Heart murmur   . History of sick sinus syndrome    s/p pacemaker placement  . Hyperlipidemia   . Macular degeneration   . Neuropathy ([place])   . Neuropathy ([place])   . Pacemaker   . Stroke ([place])   . TIA (transient ischemic attack)    Past Surgical History:  Procedure Laterality Date  . ABDOMINAL HYSTERECTOMY    . APPENDECTOMY    . [place]  . BREAST SURGERY    . CARDIAC CATHETERIZATION  1989  . CATARACT EXTRACTION  1997 and 1998  . COLONOSCOPY WITH PROPOFOL N/A 09/12/2015   Procedure: COLONOSCOPY WITH PROPOFOL;  Surgeon: [name], MD;  Location: [facility] ENDOSCOPY;  Service: Endoscopy;  Laterality: N/A;  . pace maker    . [place]  . TONSILLECTOMY AND ADENOIDECTOMY     Family History  Problem Relation Age of Onset  . Stroke Mother   . Diabetes Mother   . Cancer Maternal Aunt     Breast Cancer  . Arthritis Maternal Grandmother   . Cancer Maternal Aunt     Lung Cancer  . Kidney disease Neg Hx   . Bladder Cancer  Neg Hx    Social History   Social History  . Marital status: Single    Spouse name: N/A  . Number of children: N/A  . Years of education: N/A   Social History Main Topics  . Smoking status: Former Research scientist (life sciences)  . Smokeless tobacco: Never Used     Comment: quit 1970  . Alcohol use No  . Drug use: No  . Sexual activity: Not Asked   Other Topics Concern  . None   Social History Narrative  . None    Outpatient Encounter Prescriptions as of 11/16/2016  Medication Sig  . Aflibercept 2 MG/0.05ML SOLN   . aspirin 81 MG tablet Take 81 mg by mouth daily.  . blood glucose meter kit and supplies Per patient please dispense One touch Ultra2 meter.Use meter and supplies three times a day to check blood sugar. ICD10: E11.40  . digoxin (LANOXIN) 0.125 MG tablet   . doxycycline (VIBRAMYCIN) 100 MG capsule Take by mouth.  . estradiol (ESTRACE VAGINAL) 0.1 MG/GM vaginal cream Apply 0.57m (pea-sized amount)  just inside the vaginal introitus with a finger-tip every night for two weeks and  then Monday, Wednesday and Friday nights.  . furosemide (LASIX) 20 MG tablet Take 20 mg by mouth.  Marland Kitchen glucose blood (ONE TOUCH ULTRA TEST) test strip USE ONE STRIP TO CHECK GLUCOSE THREE TIMES DAILY  . Lactobacillus (PROBIOTIC ACIDOPHILUS PO) Take by mouth.  . liraglutide (VICTOZA) 18 MG/3ML SOPN Inject 18 mg into the skin daily.  Marland Kitchen MAGNESIUM SULFATE PO Take by mouth.  . metFORMIN (GLUCOPHAGE-XR) 500 MG 24 hr tablet Take 1 tablet (500 mg total) by mouth daily with breakfast.  . metoprolol (LOPRESSOR) 50 MG tablet Take 50 mg by mouth 2 (two) times daily.  . Multiple Vitamins-Minerals (PRESERVISION AREDS 2 PO) Take by mouth.  . vitamin B-12 (CYANOCOBALAMIN) 1000 MCG tablet Take 1,000 mcg by mouth daily.  . [DISCONTINUED] metFORMIN (GLUCOPHAGE-XR) 500 MG 24 hr tablet TAKE TWO TABLETS BY MOUTH TWICE DAILY   No facility-administered encounter medications on file as of 11/16/2016.     Review of Systems  Constitutional:  Negative for appetite change and unexpected weight change.  HENT: Negative for congestion and sinus pressure.   Respiratory: Negative for cough, chest tightness and shortness of breath.   Cardiovascular: Negative for chest pain, palpitations and leg swelling.  Gastrointestinal: Negative for abdominal pain, diarrhea, nausea and vomiting.  Genitourinary: Negative for difficulty urinating and dysuria.  Musculoskeletal: Negative for back pain and joint swelling.  Skin: Negative for color change and rash.  Neurological: Negative for dizziness, light-headedness and headaches.  Psychiatric/Behavioral: Negative for agitation and dysphoric mood.       Objective:    Physical Exam  Constitutional: She appears well-developed and well-nourished. No distress.  HENT:  Nose: Nose normal.  Mouth/Throat: Oropharynx is clear and moist.  Neck: Neck supple. No thyromegaly present.  Cardiovascular: Normal rate and regular rhythm.   Pulmonary/Chest: Breath sounds normal. No respiratory distress. She has no wheezes.  Abdominal: Soft. Bowel sounds are normal. There is no tenderness.  Musculoskeletal: She exhibits no edema or tenderness.  Lymphadenopathy:    She has no cervical adenopathy.  Skin: No rash noted. No erythema.  Psychiatric: She has a normal mood and affect. Her behavior is normal.    BP 134/70 (BP Location: Left Arm, Patient Position: Sitting, Cuff Size: Large)   Pulse 74   Temp 97.8 F (36.6 C) (Oral)   Resp 16   Ht _0  (1.6 m)   Wt 160 lb 6.4 oz (72.8 kg)   SpO2 95%   BMI 28.41 kg/m  Wt Readings from Last 3 Encounters:  11/16/16 160 lb 6.4 oz (72.8 kg)  11/10/16 163 lb (73.9 kg)  08/17/16 164 lb 9.6 oz (74.7 kg)     Lab Results  Component Value Date   WBC 5.7 08/20/2016   HGB 13.9 10/01/2015   HCT 41.2 08/20/2016   PLT 180 08/20/2016   GLUCOSE 244 (H) 08/20/2016   CHOL 193 08/20/2016   TRIG 115 08/20/2016   HDL 68 08/20/2016   LDLCALC 102 (H) 08/20/2016   ALT 21  08/20/2016   AST 20 08/20/2016   NA 135 08/20/2016   K 4.3 08/20/2016   CL 97 08/20/2016   CREATININE 0.63 08/20/2016   BUN 10 08/20/2016   CO2 22 08/20/2016   TSH 3.22 03/09/2016   HGBA1C 10.5 (H) 08/20/2016   MICROALBUR 65.4 (H) 03/09/2016    Dg Bone Density  Result Date: 07/14/2016 EXAM: DUAL X-RAY ABSORPTIOMETRY (DXA) FOR BONE MINERAL DENSITY IMPRESSION: Dear Dr. [name], Your patient Holly Rose completed a FRAX assessment  on 07/14/2016 using the [place] (analysis version: 14.10) manufactured by [employer]. The following summarizes the results of our evaluation. PATIENT BIOGRAPHICAL: Name: Shakyra, Mattera Patient ID: [id number] Birth Date: 27-Jan-1930 Height:    62.5 in. Gender:     Female    [age]       Weight:    163.5 lbs. Ethnicity:  White                            Exam Date: 07/14/2016 FRAX* RESULTS:  (version: 3.5) 10-year Probability of Fracture1 Major Osteoporotic Fracture2 Hip Fracture 17.0% 5.6% Population: [place] (Caucasian) Risk Factors: None Based on Femur (Left) Neck BMD 1 -The 10-year probability of fracture may be lower than reported if the patient has received treatment. 2 -Major Osteoporotic Fracture: Clinical Spine, Forearm, Hip or Shoulder *FRAX is a Materials engineer of the [employer] for Metabolic Bone Disease, a [place] (WHO) [facility]. ASSESSMENT: The probability of a major osteoporotic fracture is 17.0% within the next ten years. The probability of a hip fracture is 5.6% within the next ten years. . Dear Dr. [name], Your patient Netanya Yazdani completed a BMD test on 07/14/2016 using the [place] (analysis version: 14.10) manufactured by [employer]. The following summarizes the results of our evaluation. PATIENT BIOGRAPHICAL: Name: Delaina, Fetsch Patient ID: [id number] Birth Date: [birth year] Height: 62.5 in. Gender: Female Exam Date: 07/14/2016  Weight: 163.5 lbs. Indications: Advanced Age, Caucasian, Hysterectomy, Postmenopausal Fractures: Treatments: Estrace, Multi-Vitamin with calcium ASSESSMENT: The BMD measured at Femur Neck Left is 0.726 g/cm2 with a T-score of -2.2. This patient is considered osteopenic according to [facility]) criteria. Site Region Measured Measured WHO Young Adult BMD Date       Age      Classification T-score AP Spine L1-L4 07/14/2016 84.6 Osteopenia -1.1 1.054 g/cm2 DualFemur Neck Left 07/14/2016 84.6 Osteopenia -2.2 0.726 g/cm2 World Health Organization [facility]) criteria for post-menopausal, Caucasian Women: Normal:       T-score at or above -1 SD Osteopenia:   T-score between -1 and -2.5 SD Osteoporosis: T-score at or below -2.5 SD RECOMMENDATIONS: [place] recommends that FDA-approved medical therapies be considered in postmenopausal women and men age 88 or older with a: 1. Hip or vertebral (clinical or morphometric) fracture. 2. T-score of < -2.5 at the spine or hip. 3. Ten-year fracture probability by FRAX of 3% or greater for hip fracture or 20% or greater for major osteoporotic fracture. All treatment decisions require clinical judgment and consideration of individual patient factors, including patient preferences, co-morbidities, previous drug use, risk factors not captured in the FRAX model (e.g. falls, vitamin D deficiency, increased bone turnover, interval significant decline in bone density) and possible under - or over-estimation of fracture risk by FRAX. All patients should ensure an adequate intake of dietary calcium (1200 mg/d) and vitamin D (800 IU daily) unless contraindicated. FOLLOW-UP: People with diagnosed cases of osteoporosis or at high risk for fracture should have regular bone mineral density tests. For patients eligible for Medicare, routine testing is allowed once every 2 years. The testing frequency can be increased to one year for patients who have rapidly  progressing disease, those who are receiving or discontinuing medical therapy to restore bone mass, or have additional risk factors. I have reviewed this report, and agree with the above findings.  [facility] Radiology Electronically Signed   By: Lowella Grip III M.D.   On: 07/14/2016 11:36       Assessment & Plan:   Problem List Items Addressed This Visit    A-fib [facility])    Pacemaker in place.  Has been followed by Dr Ubaldo Glassing.        Relevant Orders   Digoxin level   CAD (coronary artery disease)    Has been followed by cardiology.  Stable.        Hypercholesterolemia    Low cholesterol diet and exercise.  Follow lipid panel.        Relevant Orders   Hepatic function panel   Lipid panel   Iron deficiency anemia    Has been followed by Dr Gaylyn Cheers.  Follow cbc.       Relevant Orders   CBC with Differential/Platelet   Ferritin   Left breast lump    Awaiting mammogram and ultrasound.  Have been ordered.        Stress    Increased stress as outlined.  Overall appears to be handling things well.  Follow.        Type 2 diabetes mellitus with diabetic neuropathy [facility])    Seeing endocrinology now.  Sugars better.  On victoza.  Follow.        Relevant Medications   metFORMIN (GLUCOPHAGE-XR) 500 MG 24 hr tablet   Other Relevant Orders   Hemoglobin A1c   TSH   Basic metabolic panel   Microalbumin / creatinine urine ratio       [name], MD

## 2016-11-16 NOTE — Progress Notes (Signed)
Pre-visit discussion using our clinic review tool. No additional management support is needed unless otherwise documented below in the visit note.  

## 2016-11-17 DIAGNOSIS — Z950 Presence of cardiac pacemaker: Secondary | ICD-10-CM | POA: Diagnosis not present

## 2016-11-17 DIAGNOSIS — N200 Calculus of kidney: Secondary | ICD-10-CM | POA: Diagnosis not present

## 2016-11-17 DIAGNOSIS — I480 Paroxysmal atrial fibrillation: Secondary | ICD-10-CM | POA: Diagnosis not present

## 2016-11-17 DIAGNOSIS — E213 Hyperparathyroidism, unspecified: Secondary | ICD-10-CM | POA: Diagnosis not present

## 2016-11-17 DIAGNOSIS — G629 Polyneuropathy, unspecified: Secondary | ICD-10-CM | POA: Diagnosis not present

## 2016-11-17 DIAGNOSIS — E1140 Type 2 diabetes mellitus with diabetic neuropathy, unspecified: Secondary | ICD-10-CM | POA: Diagnosis not present

## 2016-11-17 DIAGNOSIS — Z01818 Encounter for other preprocedural examination: Secondary | ICD-10-CM | POA: Diagnosis not present

## 2016-11-19 ENCOUNTER — Inpatient Hospital Stay
Admission: RE | Admit: 2016-11-19 | Discharge: 2016-11-19 | Disposition: A | Discharge status: Home / Self Care | Payer: Self-pay | Source: Ambulatory Visit | Attending: *Deleted | Admitting: *Deleted

## 2016-11-19 ENCOUNTER — Other Ambulatory Visit: Payer: Self-pay | Admitting: *Deleted

## 2016-11-19 DIAGNOSIS — Z9289 Personal history of other medical treatment: Secondary | ICD-10-CM

## 2016-11-22 ENCOUNTER — Encounter: Payer: Self-pay | Admitting: Internal Medicine

## 2016-11-22 NOTE — Assessment & Plan Note (Signed)
Low cholesterol diet and exercise.  Follow lipid panel.   

## 2016-11-22 NOTE — Assessment & Plan Note (Signed)
Pacemaker in place.  Has been followed by Dr Ubaldo Glassing.

## 2016-11-22 NOTE — Assessment & Plan Note (Signed)
Seeing endocrinology now.  Sugars better.  On victoza.  Follow.

## 2016-11-22 NOTE — Assessment & Plan Note (Signed)
Increased stress as outlined.  Overall appears to be handling things well  Follow.  

## 2016-11-22 NOTE — Assessment & Plan Note (Signed)
Has been followed by cardiology.  Stable.  

## 2016-11-22 NOTE — Assessment & Plan Note (Signed)
Seeing endocrinology now.

## 2016-11-22 NOTE — Assessment & Plan Note (Signed)
Has been followed by Dr Gaylyn Cheers.  Follow [facility].

## 2016-11-22 NOTE — Assessment & Plan Note (Signed)
Awaiting mammogram and ultrasound.  Have been ordered.

## 2016-11-22 NOTE — Assessment & Plan Note (Signed)
Schedule diagnostic mammogram and ultrasound.  She has declined recent mammograms.  Agreed today for scheduled exam.

## 2016-11-23 ENCOUNTER — Ambulatory Visit: Payer: Medicare Other | Admitting: Podiatry

## 2016-11-23 DIAGNOSIS — I480 Paroxysmal atrial fibrillation: Secondary | ICD-10-CM | POA: Diagnosis not present

## 2016-11-23 DIAGNOSIS — I252 Old myocardial infarction: Secondary | ICD-10-CM | POA: Diagnosis not present

## 2016-11-23 DIAGNOSIS — Z794 Long term (current) use of insulin: Secondary | ICD-10-CM | POA: Diagnosis not present

## 2016-11-23 DIAGNOSIS — Z85828 Personal history of other malignant neoplasm of skin: Secondary | ICD-10-CM | POA: Diagnosis not present

## 2016-11-23 DIAGNOSIS — Z8673 Personal history of transient ischemic attack (TIA), and cerebral infarction without residual deficits: Secondary | ICD-10-CM | POA: Diagnosis not present

## 2016-11-23 DIAGNOSIS — N3090 Cystitis, unspecified without hematuria: Secondary | ICD-10-CM | POA: Diagnosis not present

## 2016-11-23 DIAGNOSIS — I341 Nonrheumatic mitral (valve) prolapse: Secondary | ICD-10-CM | POA: Diagnosis not present

## 2016-11-23 DIAGNOSIS — Z79899 Other long term (current) drug therapy: Secondary | ICD-10-CM | POA: Diagnosis not present

## 2016-11-23 DIAGNOSIS — Z95810 Presence of automatic (implantable) cardiac defibrillator: Secondary | ICD-10-CM | POA: Diagnosis not present

## 2016-11-23 DIAGNOSIS — E213 Hyperparathyroidism, unspecified: Secondary | ICD-10-CM | POA: Diagnosis not present

## 2016-11-23 DIAGNOSIS — Z87891 Personal history of nicotine dependence: Secondary | ICD-10-CM | POA: Diagnosis not present

## 2016-11-23 DIAGNOSIS — K5791 Diverticulosis of intestine, part unspecified, without perforation or abscess with bleeding: Secondary | ICD-10-CM | POA: Diagnosis not present

## 2016-11-23 DIAGNOSIS — N281 Cyst of kidney, acquired: Secondary | ICD-10-CM | POA: Diagnosis not present

## 2016-11-23 DIAGNOSIS — I495 Sick sinus syndrome: Secondary | ICD-10-CM | POA: Diagnosis not present

## 2016-11-23 DIAGNOSIS — E1140 Type 2 diabetes mellitus with diabetic neuropathy, unspecified: Secondary | ICD-10-CM | POA: Diagnosis not present

## 2016-11-23 DIAGNOSIS — D509 Iron deficiency anemia, unspecified: Secondary | ICD-10-CM | POA: Diagnosis not present

## 2016-11-23 DIAGNOSIS — N200 Calculus of kidney: Secondary | ICD-10-CM | POA: Diagnosis not present

## 2016-11-23 DIAGNOSIS — Z886 Allergy status to analgesic agent status: Secondary | ICD-10-CM | POA: Diagnosis not present

## 2016-11-23 DIAGNOSIS — N3941 Urge incontinence: Secondary | ICD-10-CM | POA: Diagnosis not present

## 2016-12-01 ENCOUNTER — Other Ambulatory Visit: Payer: BLUE CROSS/BLUE SHIELD

## 2016-12-01 ENCOUNTER — Ambulatory Visit: Payer: BLUE CROSS/BLUE SHIELD

## 2016-12-07 ENCOUNTER — Ambulatory Visit
Admission: RE | Admit: 2016-12-07 | Discharge: 2016-12-07 | Disposition: A | Discharge status: Home / Self Care | Payer: Medicare Other | Source: Ambulatory Visit | Attending: Internal Medicine | Admitting: Internal Medicine

## 2016-12-07 ENCOUNTER — Other Ambulatory Visit: Payer: Self-pay | Admitting: Internal Medicine

## 2016-12-07 DIAGNOSIS — I899 Noninfective disorder of lymphatic vessels and lymph nodes, unspecified: Secondary | ICD-10-CM | POA: Diagnosis not present

## 2016-12-07 DIAGNOSIS — N63 Unspecified lump in unspecified breast: Secondary | ICD-10-CM

## 2016-12-07 DIAGNOSIS — N6320 Unspecified lump in the left breast, unspecified quadrant: Secondary | ICD-10-CM

## 2016-12-07 DIAGNOSIS — R928 Other abnormal and inconclusive findings on diagnostic imaging of breast: Secondary | ICD-10-CM | POA: Diagnosis not present

## 2016-12-07 LAB — HM MAMMOGRAPHY

## 2016-12-08 ENCOUNTER — Other Ambulatory Visit: Payer: Self-pay | Admitting: Internal Medicine

## 2016-12-08 DIAGNOSIS — N6320 Unspecified lump in the left breast, unspecified quadrant: Secondary | ICD-10-CM

## 2016-12-08 DIAGNOSIS — H353211 Exudative age-related macular degeneration, right eye, with active choroidal neovascularization: Secondary | ICD-10-CM | POA: Diagnosis not present

## 2016-12-08 DIAGNOSIS — R928 Other abnormal and inconclusive findings on diagnostic imaging of breast: Secondary | ICD-10-CM

## 2016-12-09 DIAGNOSIS — N200 Calculus of kidney: Secondary | ICD-10-CM | POA: Diagnosis not present

## 2016-12-09 DIAGNOSIS — T191XXA Foreign body in bladder, initial encounter: Secondary | ICD-10-CM | POA: Diagnosis not present

## 2016-12-10 ENCOUNTER — Encounter: Payer: Self-pay | Admitting: General Surgery

## 2016-12-10 ENCOUNTER — Other Ambulatory Visit: Payer: Self-pay | Admitting: Internal Medicine

## 2016-12-10 DIAGNOSIS — R928 Other abnormal and inconclusive findings on diagnostic imaging of breast: Secondary | ICD-10-CM

## 2016-12-10 DIAGNOSIS — N200 Calculus of kidney: Secondary | ICD-10-CM | POA: Diagnosis not present

## 2016-12-10 DIAGNOSIS — E1142 Type 2 diabetes mellitus with diabetic polyneuropathy: Secondary | ICD-10-CM | POA: Diagnosis not present

## 2016-12-10 DIAGNOSIS — Z794 Long term (current) use of insulin: Secondary | ICD-10-CM | POA: Diagnosis not present

## 2016-12-10 NOTE — Progress Notes (Signed)
Order placed for surgery referral.  

## 2016-12-14 DIAGNOSIS — H353221 Exudative age-related macular degeneration, left eye, with active choroidal neovascularization: Secondary | ICD-10-CM | POA: Diagnosis not present

## 2016-12-17 ENCOUNTER — Ambulatory Visit (INDEPENDENT_AMBULATORY_CARE_PROVIDER_SITE_OTHER): Payer: Medicare Other | Admitting: General Surgery

## 2016-12-17 ENCOUNTER — Inpatient Hospital Stay: Payer: Self-pay

## 2016-12-17 ENCOUNTER — Ambulatory Visit: Payer: Medicare Other | Admitting: General Surgery

## 2016-12-17 ENCOUNTER — Encounter: Payer: Self-pay | Admitting: General Surgery

## 2016-12-17 VITALS — BP 162/90 | HR 102 | Resp 14 | Ht 62.5 in | Wt 159.0 lb

## 2016-12-17 DIAGNOSIS — N6320 Unspecified lump in the left breast, unspecified quadrant: Secondary | ICD-10-CM

## 2016-12-17 DIAGNOSIS — C50812 Malignant neoplasm of overlapping sites of left female breast: Secondary | ICD-10-CM | POA: Diagnosis not present

## 2016-12-17 DIAGNOSIS — N6321 Unspecified lump in the left breast, upper outer quadrant: Secondary | ICD-10-CM | POA: Diagnosis not present

## 2016-12-17 DIAGNOSIS — I2510 Atherosclerotic heart disease of native coronary artery without angina pectoris: Secondary | ICD-10-CM

## 2016-12-17 DIAGNOSIS — N6489 Other specified disorders of breast: Secondary | ICD-10-CM | POA: Diagnosis not present

## 2016-12-17 HISTORY — PX: BREAST BIOPSY: SHX20

## 2016-12-17 NOTE — Progress Notes (Addendum)
Patient ID: Holly Rose, female   DOB: Dec 30, 1929, 83 y.o.   MRN: [id number]  Chief Complaint  Patient presents with  . Breast Problem    HPI Denicia A Goodnow is a 83 y.o. female.  who presents for a breast evaluation. The most recent mammogram was done on 12-07-16. Prior mammogram was 1996 she was told she didn't need any more mammograms at age 29. She states she can feel and see a left breast knot about 6-8 weeks ago. She denies pain. Denies any breast injury or trauma. Patient does perform regular self breast checks and she does not get regular mammograms done.   On doxycycline for kidney infection. She is accompanied today by Emelia Salisbury, her daughter in law.  The patient's son, and Ms. Kretz husband recently died of lung cancer.  The patient is looking forward to celebrating her 76 th wedding anniversary this June.  FSBS this morning was 150. Dr Ubaldo Glassing is her cardiologist.  When originally diagnosed with atrial fibrillation she was placed on Coumadin but they had such extreme difficulties maintaining therapeutic levels it was discontinued.    HPI  Past Medical History:  Diagnosis Date  . Allergy   . Anemia   . Arthritis   . Atrial fibrillation ([place]) 2004  . CAD (coronary artery disease)   . Cancer ([place])    H/O skin cancer, squamous  . Diabetes ([place])   . Heart attack 2008  . Heart murmur   . History of sick sinus syndrome    s/p pacemaker placement  . Hyperlipidemia   . Kidney calculi 11/2016  . Macular degeneration   . Neuropathy ([place])   . Neuropathy ([place])   . Pacemaker 06/2010  . TIA (transient ischemic attack)     Past Surgical History:  Procedure Laterality Date  . ABDOMINAL HYSTERECTOMY  1960's  . APPENDECTOMY  1947  . [place]  . BREAST EXCISIONAL BIOPSY Left 01/18/1986   Benign microcalcifications, [employer].  Marland Kitchen BREAST SURGERY  1988  . CARDIAC CATHETERIZATION  1989  . CATARACT EXTRACTION  1997 and 1998  .  COLONOSCOPY WITH PROPOFOL N/A 09/12/2015   Procedure: COLONOSCOPY WITH PROPOFOL;  Surgeon: [name], MD;  Location: [facility] ENDOSCOPY;  Service: Endoscopy;  Laterality: N/A;  . KIDNEY STONE SURGERY  2018  . pace maker  2008  . [place]  . TONSILLECTOMY AND ADENOIDECTOMY  1950"s    Family History  Problem Relation Age of Onset  . Stroke Mother   . Diabetes Mother   . Cancer Maternal Aunt     Breast Cancer  . Breast cancer Maternal Aunt   . Arthritis Maternal Grandmother   . Cancer Maternal Aunt     Lung Cancer  . Kidney disease Neg Hx   . Bladder Cancer Neg Hx     Social History Social History  Substance Use Topics  . Smoking status: Former Smoker    Years: 5.00    Quit date: 09/14/1968  . Smokeless tobacco: Never Used     Comment: quit 1970  . Alcohol use No    Allergies  Allergen Reactions  . Lyrica [Pregabalin] Other (See Comments) and Swelling    Other reaction(s): SWELLING/EDEMA Other reaction(s): SWELLING/EDEMA Sleepy, does not eat  . Neurontin [Gabapentin] Other (See Comments) and Nausea And Vomiting    Other reaction(s): Other (See Comments) Out of it Other reaction(s): Other (See Comments) Out of it Other reaction(s): OTHER Out  of it  . Bactrim [Sulfamethoxazole-Trimethoprim]     Made her dizziness    Current Outpatient Prescriptions  Medication Sig Dispense Refill  . aspirin 81 MG tablet Take 81 mg by mouth daily.    . blood glucose meter kit and supplies Per patient please dispense One touch Ultra2 meter.Use meter and supplies three times a day to check blood sugar. ICD10: E11.40 1 each 0  . digoxin (LANOXIN) 0.125 MG tablet     . doxycycline (VIBRAMYCIN) 100 MG capsule Take by mouth.    . estradiol (ESTRACE VAGINAL) 0.1 MG/GM vaginal cream Apply 0.79m (pea-sized amount)  just inside the vaginal introitus with a finger-tip every night for two weeks and then Monday, Wednesday and Friday nights. 30 g 12  . furosemide (LASIX) 20 MG  tablet Take 20 mg by mouth.    .Marland Kitchenglucose blood (ONE TOUCH ULTRA TEST) test strip USE ONE STRIP TO CHECK GLUCOSE THREE TIMES DAILY 100 each 2  . Lactobacillus (PROBIOTIC ACIDOPHILUS PO) Take by mouth.    . liraglutide (VICTOZA) 18 MG/3ML SOPN Inject 18 mg into the skin daily.    .Marland KitchenMAGNESIUM SULFATE PO Take by mouth.    . metFORMIN (GLUCOPHAGE-XR) 500 MG 24 hr tablet Take 1 tablet (500 mg total) by mouth daily with breakfast. 90 tablet 1  . metoprolol (LOPRESSOR) 50 MG tablet Take 50 mg by mouth 2 (two) times daily.    . Multiple Vitamins-Minerals (PRESERVISION AREDS 2 PO) Take by mouth.    . vitamin B-12 (CYANOCOBALAMIN) 1000 MCG tablet Take 1,000 mcg by mouth daily.     No current facility-administered medications for this visit.     Review of Systems Review of Systems  Constitutional: Negative.   Respiratory: Negative.   Cardiovascular: Negative.     Blood pressure (!) 162/90, pulse (!) 102, resp. rate 14, height 5' 2.5" (1.588 m), weight 159 lb (72.1 kg).  Physical Exam Physical Exam  Constitutional: She is oriented to person, place, and time. She appears well-developed and well-nourished.  HENT:  Mouth/Throat: Oropharynx is clear and moist.  Eyes: Conjunctivae are normal. No scleral icterus.  Neck: Neck supple.  Cardiovascular: Normal rate and normal heart sounds.  An irregular rhythm present.  Pulmonary/Chest: Effort normal and breath sounds normal. Right breast exhibits no inverted nipple, no mass, no nipple discharge, no skin change and no tenderness. Left breast exhibits mass. Left breast exhibits no inverted nipple, no nipple discharge, no skin change and no tenderness.    1.5 cm mass at 12 o'clock right breast  Lymphadenopathy:    She has no cervical adenopathy.    She has no axillary adenopathy.  Neurological: She is alert and oriented to person, place, and time.  Skin: Skin is warm and dry.  Psychiatric: Her behavior is normal.    Data Reviewed Mammograms from  12/07/2016 including a left breast ultrasound were reviewed. Solitary mass in the upper aspect of the left breast with ultrasound showing a mixed lesion. BI-RADS-4. [facility] mid [facility]s showed that she had undergone wire localization of both breasts in 1987 for microcalcifications. Pathology was not available.  Assessment    New left breast mass suspicious for malignancy.    Plan    The patient was amenable to having a biopsy completed, and I explained my concern for possible malignancy. The procedure was reviewed with both the patient and her daughter-in-law.  Ultrasound examination of the left breast showed a  solitary dominant lesion at the 12:00 position, 5 cm from the nipple which had areas of focal acoustic enhancement as well as areas of posterior acoustic shadowing. The latter were more medially-based. There appeared to be a cystic and solid component. Duplex imaging showed no internal flow. This measured 1.14 x 1.5 x 1.56 cm.  10 mL of 0.5% Xylocaine with 0.25% Marcaine with 1-200,000 units was infiltrated and well tolerated. With this needle aspiration of the lesion yielded some dark fluid consistent with old blood and there was a significant decrease in volume of the mass.  ChloraPrep was applied to the skin and a 14-gauge Finesse biopsy device was used to remove 8 core samples. There was a significant decrease in size of the lesion. Patient had transient pain that resolved by the end of the procedure. A postbiopsy clip was placed. Scant bleeding from the puncture site. This was controlled with direct pressure. The skin defect was closed with benzoin and Steri-Strips followed by Telfa and Tegaderm dressing.  Approximately 45 minutes was spent with the patient today.  The patient's daughter-in-law will be contacted when pathology is available per patient request.  HPI, Physical Exam, Assessment and Plan have been scribed  under the direction and in the presence of [name], MD.  Karie Fetch, RN  I have completed the exam and reviewed the above documentation for accuracy and completeness.  I agree with the above.  Haematologist has been used and any errors in dictation or transcription are unintentional.  Hervey Ard, M.D., F.A.C.S. [name] 12/17/2016, 8:37 PM

## 2016-12-17 NOTE — Patient Instructions (Addendum)

## 2016-12-18 ENCOUNTER — Telehealth: Payer: Self-pay | Admitting: General Surgery

## 2016-12-18 NOTE — Telephone Encounter (Signed)
Notified daughter in law that path inconclusive.  Will arrange f/u visit next week to re-assess next step.

## 2016-12-21 DIAGNOSIS — N200 Calculus of kidney: Secondary | ICD-10-CM | POA: Diagnosis not present

## 2016-12-23 ENCOUNTER — Encounter: Payer: Self-pay | Admitting: General Surgery

## 2016-12-23 ENCOUNTER — Inpatient Hospital Stay: Payer: Self-pay

## 2016-12-23 ENCOUNTER — Ambulatory Visit (INDEPENDENT_AMBULATORY_CARE_PROVIDER_SITE_OTHER): Payer: Medicare Other | Admitting: General Surgery

## 2016-12-23 VITALS — BP 168/84 | HR 88 | Resp 12 | Ht 62.5 in | Wt 159.0 lb

## 2016-12-23 DIAGNOSIS — I2510 Atherosclerotic heart disease of native coronary artery without angina pectoris: Secondary | ICD-10-CM

## 2016-12-23 DIAGNOSIS — N6321 Unspecified lump in the left breast, upper outer quadrant: Secondary | ICD-10-CM | POA: Diagnosis not present

## 2016-12-23 DIAGNOSIS — N6320 Unspecified lump in the left breast, unspecified quadrant: Secondary | ICD-10-CM

## 2016-12-23 DIAGNOSIS — N6489 Other specified disorders of breast: Secondary | ICD-10-CM | POA: Diagnosis not present

## 2016-12-23 DIAGNOSIS — C50812 Malignant neoplasm of overlapping sites of left female breast: Secondary | ICD-10-CM | POA: Diagnosis not present

## 2016-12-23 NOTE — Progress Notes (Signed)
Patient ID: Holly Rose, female   DOB: 29-Mar-1930, 83 y.o.   MRN: [id number]  Chief Complaint  Patient presents with  . Other    HPI Holly Rose is a 83 y.o. female.  Here today for post left breast biopsy.  She is accompanied today by Emelia Salisbury, her daughter in law   HPI  Past Medical History:  Diagnosis Date  . Allergy   . Anemia   . Arthritis   . Atrial fibrillation ([place]) 2004  . CAD (coronary artery disease)   . Cancer ([place])    H/O skin cancer, squamous  . Diabetes ([place])   . Heart attack 2008  . Heart murmur   . History of sick sinus syndrome    s/p pacemaker placement  . Hyperlipidemia   . Kidney calculi 11/2016  . Macular degeneration   . Neuropathy ([place])   . Neuropathy ([place])   . Pacemaker 06/2010  . TIA (transient ischemic attack)     Past Surgical History:  Procedure Laterality Date  . ABDOMINAL HYSTERECTOMY  1960's  . APPENDECTOMY  1947  . [place]  . BREAST BIOPSY Left 12/17/2016   SINGLE FRAGMENT OF ATYPICAL EPITHELIAL CELLS  . BREAST EXCISIONAL BIOPSY Left 01/18/1986   Benign microcalcifications, [employer].  Marland Kitchen BREAST SURGERY  1988  . CARDIAC CATHETERIZATION  1989  . CATARACT EXTRACTION  1997 and 1998  . COLONOSCOPY WITH PROPOFOL N/A 09/12/2015   Procedure: COLONOSCOPY WITH PROPOFOL;  Surgeon: [name], MD;  Location: [facility] ENDOSCOPY;  Service: Endoscopy;  Laterality: N/A;  . KIDNEY STONE SURGERY  2018  . pace maker  2008  . [place]  . TONSILLECTOMY AND ADENOIDECTOMY  1950"s    Family History  Problem Relation Age of Onset  . Stroke Mother   . Diabetes Mother   . Cancer Maternal Aunt     Breast Cancer  . Breast cancer Maternal Aunt   . Arthritis Maternal Grandmother   . Cancer Maternal Aunt     Lung Cancer  . Kidney disease Neg Hx   . Bladder Cancer Neg Hx     Social History Social History  Substance Use Topics  . Smoking status: Former Smoker    Years: 5.00   Quit date: 09/14/1968  . Smokeless tobacco: Never Used     Comment: quit 1970  . Alcohol use No    Allergies  Allergen Reactions  . Lyrica [Pregabalin] Other (See Comments) and Swelling    Other reaction(s): SWELLING/EDEMA Other reaction(s): SWELLING/EDEMA Sleepy, does not eat  . Neurontin [Gabapentin] Other (See Comments) and Nausea And Vomiting    Other reaction(s): Other (See Comments) Out of it Other reaction(s): Other (See Comments) Out of it Other reaction(s): OTHER Out of it  . Bactrim [Sulfamethoxazole-Trimethoprim]     Made her dizziness    Current Outpatient Prescriptions  Medication Sig Dispense Refill  . aspirin 81 MG tablet Take 81 mg by mouth daily.    . blood glucose meter kit and supplies Per patient please dispense One touch Ultra2 meter.Use meter and supplies three times a day to check blood sugar. ICD10: E11.40 1 each 0  . digoxin (LANOXIN) 0.125 MG tablet     . doxycycline (VIBRAMYCIN) 100 MG capsule Take by mouth.    . estradiol (ESTRACE VAGINAL) 0.1 MG/GM vaginal cream Apply 0.52m (pea-sized amount)  just inside the vaginal introitus with a finger-tip every night for two weeks and then  Monday, Wednesday and Friday nights. 30 g 12  . furosemide (LASIX) 20 MG tablet Take 20 mg by mouth.    Marland Kitchen glucose blood (ONE TOUCH ULTRA TEST) test strip USE ONE STRIP TO CHECK GLUCOSE THREE TIMES DAILY 100 each 2  . Lactobacillus (PROBIOTIC ACIDOPHILUS PO) Take by mouth.    . liraglutide (VICTOZA) 18 MG/3ML SOPN Inject 18 mg into the skin daily.    Marland Kitchen MAGNESIUM SULFATE PO Take by mouth.    . metFORMIN (GLUCOPHAGE-XR) 500 MG 24 hr tablet Take 1 tablet (500 mg total) by mouth daily with breakfast. 90 tablet 1  . metoprolol (LOPRESSOR) 50 MG tablet Take 50 mg by mouth 2 (two) times daily.    . Multiple Vitamins-Minerals (PRESERVISION AREDS 2 PO) Take by mouth.    . vitamin B-12 (CYANOCOBALAMIN) 1000 MCG tablet Take 1,000 mcg by mouth daily.     No current facility-administered  medications for this visit.     Review of Systems Review of Systems  Constitutional: Negative.   Respiratory: Negative.   Cardiovascular: Negative.     Blood pressure (!) 168/84, pulse 88, resp. rate 12, height 5' 2.5" (1.588 m), weight 159 lb (72.1 kg).  Physical Exam Physical Exam  Constitutional: She is oriented to person, place, and time. She appears well-developed and well-nourished.  Pulmonary/Chest: Left breast exhibits mass. Left breast exhibits no inverted nipple, no nipple discharge, no skin change and no tenderness.    minimal bruise left breast biopsy site  Neurological: She is alert and oriented to person, place, and time.  Skin: Skin is warm and dry.  Psychiatric: Her behavior is normal.    Data Reviewed December 17, 2016 biopsy results had previously been presented to the patient's daughter by phone. Breast, left, needle core biopsy, 12 o'clock - SINGLE FRAGMENT OF ATYPICAL EPITHELIAL CELLS. - SEE COMMENT. Microscopic Comment Most of the specimen consists of benign fibroadipose tissue with debris, suggestive of a cyst. There is a single fragment of atypical epithelial cells present, however. Additional tissue may help better evaluate the extent and severity of these atypical cells. (JDP:gt, 12/18/16)  Assessment    Biopsy that does not indicate malignancy, discordant based on clinical exam but equivocal in review of ultrasound findings.    Plan    Options for management were reviewed with the patient and her daughter: 1) observation alone versus 2) surgical excision versus 3) repeat vacuum biopsy to completely remove the area. Discussing the pros and cons of each we decided to repeat the biopsy.  Ultrasound examination of the original biopsy site showed the clip in place and a 0.6 x 1.1 x 1.24 cm hypoechoic area with shadowing associated with the clip in the 12:00 position of the left breast 5 cm from the nipple.  10 mL of 0.5% Xylocaine with 0.25% Marcaine with  1-200,000 units of epinephrine was instilled and well tolerated. Approaching the lesion from the lateral aspect in the anti-radial view, a 10-gauge Encor device was then placed in the lesion and with the sample volume center 12 mm 8 core samples obtained. The biopsy needle was then repositioned slightly inferior to encompass removal of the remaining tissue. The patient did experience some pain and additional 10 mL of 1% plain Xylocaine was instilled in the wound. Final biopsies were then obtained with minimal discomfort. Scant bleeding was noted. A new biopsy clip was placed. Benzoin and Steri-Strips followed by Telfa and Tegaderm dressing was applied. Ice pack provided.  Brief scan through the axilla shows  a mildly prominent axillary node. This was left undisturbed.    The patient's will be contacted when biopsy results are available. Further treatment options will be reviewed based on those results.  HPI, Physical Exam, Assessment and Plan have been scribed under the direction and in the presence of [name], MD.  Karie Fetch, RN  I have completed the exam and reviewed the above documentation for accuracy and completeness.  I agree with the above.  Haematologist has been used and any errors in dictation or transcription are unintentional.  Hervey Ard, M.D., F.A.C.S. [name] 12/24/2016, 4:33 PM  The patient returned to the office about an hour post procedure with evidence of breast swelling suggestive of the hematoma. The site was prepped with ChloraPrep and using a hemostat about 60 mL old blood was expressed. A compressive wrap with fluff gauze, Kerlix and an Ace wrap was applied.  We'll arrange for follow-up tomorrow for reassessment.

## 2016-12-23 NOTE — Patient Instructions (Addendum)

## 2016-12-24 ENCOUNTER — Ambulatory Visit: Payer: Medicare Other | Admitting: *Deleted

## 2016-12-24 DIAGNOSIS — N6320 Unspecified lump in the left breast, unspecified quadrant: Secondary | ICD-10-CM

## 2016-12-24 NOTE — Progress Notes (Signed)
Patient ID: Holly Rose, female   DOB: [birth year], 83 y.o.   MRN: [id number]   Patient came in today for a wound check post breast biopsy.  The wound is clean, with no signs of infection noted. Follow up in four days.

## 2016-12-24 NOTE — Patient Instructions (Signed)
The patient is aware to call back for any questions or concerns.  

## 2016-12-25 ENCOUNTER — Telehealth: Payer: Self-pay | Admitting: General Surgery

## 2016-12-25 NOTE — Telephone Encounter (Signed)
I notified the patient's daughter-in-law, Janeese Mcgloin,  that the pathology results showed a 1 mm (head of the pin) area of cancer, otherwise only scar tissue. She will relay the message to the patient when she goes to check on her later today and change her compressive wrap.  We'll review the details at her follow-up appointment on Monday, April 16.

## 2016-12-28 ENCOUNTER — Ambulatory Visit (INDEPENDENT_AMBULATORY_CARE_PROVIDER_SITE_OTHER): Payer: Medicare Other | Admitting: General Surgery

## 2016-12-28 VITALS — BP 116/62 | HR 68 | Resp 14 | Ht 62.0 in | Wt 160.0 lb

## 2016-12-28 DIAGNOSIS — C50412 Malignant neoplasm of upper-outer quadrant of left female breast: Secondary | ICD-10-CM

## 2016-12-28 DIAGNOSIS — Z170 Estrogen receptor positive status [ER+]: Secondary | ICD-10-CM

## 2016-12-28 DIAGNOSIS — N6489 Other specified disorders of breast: Secondary | ICD-10-CM | POA: Insufficient documentation

## 2016-12-28 DIAGNOSIS — C50912 Malignant neoplasm of unspecified site of left female breast: Secondary | ICD-10-CM | POA: Insufficient documentation

## 2016-12-28 NOTE — Progress Notes (Addendum)
Patient ID: [name], female   DOB: 1930/02/07, 83 y.o.   MRN: [id number]  Chief Complaint  Patient presents with  . Follow-up    HPI Rima A Sze is a 83 y.o. female here today for her post op left breast biopsy done on 12/23/2016. Patient states she is doing well, she has been using the heating pad. She is accompanied today Evalee Jefferson, her daughter in law  HPI  Past Medical History:  Diagnosis Date  . Allergy   . Anemia   . Arthritis   . Atrial fibrillation ([place]) 2004  . CAD (coronary artery disease)   . Cancer ([place])    H/O skin cancer, squamous  . Diabetes ([place])   . Heart attack ([place]) 2008  . Heart murmur   . History of sick sinus syndrome    s/p pacemaker placement  . Hyperlipidemia   . Kidney calculi 11/2016  . Macular degeneration   . Neuropathy   . Neuropathy   . Pacemaker 06/2010  . TIA (transient ischemic attack)     Past Surgical History:  Procedure Laterality Date  . ABDOMINAL HYSTERECTOMY  1960's  . APPENDECTOMY  1947  . [place]  . BREAST BIOPSY Left 12/17/2016   SINGLE FRAGMENT OF ATYPICAL EPITHELIAL CELLS  . BREAST EXCISIONAL BIOPSY Left 01/18/1986   Benign microcalcifications, [employer].  Marland Kitchen BREAST SURGERY  1988  . CARDIAC CATHETERIZATION  1989  . CATARACT EXTRACTION  1997 and 1998  . COLONOSCOPY WITH PROPOFOL N/A 09/12/2015   Procedure: COLONOSCOPY WITH PROPOFOL;  Surgeon: [name], MD;  Location: [facility] ENDOSCOPY;  Service: Endoscopy;  Laterality: N/A;  . KIDNEY STONE SURGERY  2018  . pace maker  2008  . [place]  . TONSILLECTOMY AND ADENOIDECTOMY  1950"s    Family History  Problem Relation Age of Onset  . Stroke Mother   . Diabetes Mother   . Cancer Maternal Aunt     Breast Cancer  . Breast cancer Maternal Aunt   . Arthritis Maternal Grandmother   . Cancer Maternal Aunt     Lung Cancer  . Kidney disease Neg Hx   . Bladder Cancer Neg Hx     Social History Social  History  Substance Use Topics  . Smoking status: Former Smoker    Years: 5.00    Quit date: 09/14/1968  . Smokeless tobacco: Never Used     Comment: quit 1970  . Alcohol use No    Allergies  Allergen Reactions  . Lyrica [Pregabalin] Other (See Comments) and Swelling    Other reaction(s): SWELLING/EDEMA Other reaction(s): SWELLING/EDEMA Sleepy, does not eat  . Neurontin [Gabapentin] Other (See Comments) and Nausea And Vomiting    Other reaction(s): Other (See Comments) Out of it Other reaction(s): Other (See Comments) Out of it Other reaction(s): OTHER Out of it  . Bactrim [Sulfamethoxazole-Trimethoprim]     Made her dizziness    Current Outpatient Prescriptions  Medication Sig Dispense Refill  . aspirin 81 MG tablet Take 81 mg by mouth daily.    . blood glucose meter kit and supplies Per patient please dispense One touch Ultra2 meter.Use meter and supplies three times a day to check blood sugar. ICD10: E11.40 1 each 0  . digoxin (LANOXIN) 0.125 MG tablet     . doxycycline (VIBRAMYCIN) 100 MG capsule Take by mouth.    . estradiol (ESTRACE VAGINAL) 0.1 MG/GM vaginal cream Apply 0.2m (pea-sized amount)  just inside the vaginal introitus with a finger-tip every night for two weeks and then Monday, Wednesday and Friday nights. 30 g 12  . furosemide (LASIX) 20 MG tablet Take 20 mg by mouth.    Marland Kitchen glucose blood (ONE TOUCH ULTRA TEST) test strip USE ONE STRIP TO CHECK GLUCOSE THREE TIMES DAILY 100 each 2  . Lactobacillus (PROBIOTIC ACIDOPHILUS PO) Take by mouth.    . liraglutide (VICTOZA) 18 MG/3ML SOPN Inject 18 mg into the skin daily.    Marland Kitchen MAGNESIUM SULFATE PO Take by mouth.    . metFORMIN (GLUCOPHAGE-XR) 500 MG 24 hr tablet Take 1 tablet (500 mg total) by mouth daily with breakfast. 90 tablet 1  . metoprolol (LOPRESSOR) 50 MG tablet Take 50 mg by mouth 2 (two) times daily.    . Multiple Vitamins-Minerals (PRESERVISION AREDS 2 PO) Take by mouth.    . vitamin B-12 (CYANOCOBALAMIN)  1000 MCG tablet Take 1,000 mcg by mouth daily.     No current facility-administered medications for this visit.     Review of Systems Review of Systems  Constitutional: Negative.   Respiratory: Negative.   Gastrointestinal: Negative.     Blood pressure 116/62, pulse 68, resp. rate 14, height _0  (1.575 m), weight 160 lb (72.6 kg).  Physical Exam Physical Exam  Pulmonary/Chest:      Data Reviewed Breast, left, needle core biopsy, 12:00 o'clock - INVASIVE MAMMARY CARCINOMA. - RADIAL SCAR. - SEE COMMENT. Microscopic Comment There is a microscopic of grade 1 invasive mammary carcinoma which is favored to be a ductal phenotype. Immunohistochemical stains for cytokeratin, calponin, myosin, and p63 are non-contributory. A breast prognostic profile will be attempted and the results reported separately. Dr. Bary Castilla was paged on 12/24/2016. Dr. Tresa Moore has reviewed the case and concurs with this interpretation. (JBK:ah 12/24/16) Enid Cutter MD Pathologist, Electronic Signature   Estimated volume of the palpable mass biopsied was 1.76 cm. The volume of tissue submitted on the second biopsy site was 5 cm. I do not believe there is a sampling error issue here and that this indeed is a T1a carcinoma arising in a radial scar.    Assessment    Hematoma post biopsy, identification of a 1 mm invasive mammary carcinoma.  Lymphadenopathy of questionable source on ultrasound.     Plan    At this time I would not recommend reexcision as the likelihood of residual disease is exceptionally small. I also would not rectal mind elective node biopsy as at her present age if an estrogen receptor tumors identified that excision of the node would be enough towards adjuvant chemotherapy.  At present will work toward resolution of the hematoma with the use of local heat.  The patient had been placed on Estrace cream daily in the past when she had frequent UTIs, markedly improved over the last 18  months. The patient is presently making use of Estrace cream 3 times a week. This value would be likely enough to offset any potential use of an aromatase inhibitor. The patient is asked to decrease her frequency of use to twice a week and if she remains asymptomatic over the next month will likely drop back to 1 time per week.  She likely do well with tamoxifen which would allow her to continue to use Estrace cream and not interfere with hormone blocking in the rest of the breast. The risks associated with this including a 1% chance of DVT.  Opportunity for second surgical opinion or medical oncology assessment was  reviewed. Deferred at present.  We'll arrange for follow-up examination in 10 days.       HPI, Physical Exam, Assessment and Plan have been scribed under the direction and in the presence of Hervey Ard, MD.  Gaspar Cola, CMA  I have completed the exam and reviewed the above documentation for accuracy and completeness.  I agree with the above.  Haematologist has been used and any errors in dictation or transcription are unintentional.  Hervey Ard, M.D., F.A.C.S. [name] 12/28/2016, 1:45 PM

## 2016-12-29 NOTE — Telephone Encounter (Signed)
x

## 2017-01-06 ENCOUNTER — Ambulatory Visit (INDEPENDENT_AMBULATORY_CARE_PROVIDER_SITE_OTHER): Payer: Medicare Other | Admitting: General Surgery

## 2017-01-06 ENCOUNTER — Encounter: Payer: Self-pay | Admitting: General Surgery

## 2017-01-06 VITALS — BP 130/78 | HR 68 | Resp 14 | Ht 62.0 in | Wt 160.0 lb

## 2017-01-06 DIAGNOSIS — C50412 Malignant neoplasm of upper-outer quadrant of left female breast: Secondary | ICD-10-CM

## 2017-01-06 DIAGNOSIS — Z170 Estrogen receptor positive status [ER+]: Secondary | ICD-10-CM

## 2017-01-06 DIAGNOSIS — N6489 Other specified disorders of breast: Secondary | ICD-10-CM

## 2017-01-06 NOTE — Progress Notes (Signed)
Patient ID: [name], female   DOB: 10-02-1929, 83 y.o.   MRN: [id number]  Chief Complaint  Patient presents with  . Follow-up    HPI Holly Rose is a 83 y.o. female here today for her post op left breast biopsy done on 12/23/2016. Patient states she is doing well, she has been using the heating pad. Lorriane Dehart daughter in law is present at visit.  HPI  Past Medical History:  Diagnosis Date  . Allergy   . Anemia   . Arthritis   . Atrial fibrillation ([place]) 2004  . CAD (coronary artery disease)   . Cancer ([place])    H/O skin cancer, squamous  . Diabetes ([place])   . Heart attack ([place]) 2008  . Heart murmur   . History of sick sinus syndrome    s/p pacemaker placement  . Hyperlipidemia   . Kidney calculi 11/2016  . Macular degeneration   . Neuropathy   . Neuropathy   . Pacemaker 06/2010  . TIA (transient ischemic attack)     Past Surgical History:  Procedure Laterality Date  . ABDOMINAL HYSTERECTOMY  1960's  . APPENDECTOMY  1947  . [place]  . BREAST BIOPSY Left 12/17/2016   SINGLE FRAGMENT OF ATYPICAL EPITHELIAL CELLS  . BREAST EXCISIONAL BIOPSY Left 01/18/1986   Benign microcalcifications, [employer].  Marland Kitchen BREAST SURGERY  1988  . CARDIAC CATHETERIZATION  1989  . CATARACT EXTRACTION  1997 and 1998  . COLONOSCOPY WITH PROPOFOL N/A 09/12/2015   Procedure: COLONOSCOPY WITH PROPOFOL;  Surgeon: [name], MD;  Location: [facility] ENDOSCOPY;  Service: Endoscopy;  Laterality: N/A;  . KIDNEY STONE SURGERY  2018  . pace maker  2008  . [place]  . TONSILLECTOMY AND ADENOIDECTOMY  1950"s    Family History  Problem Relation Age of Onset  . Stroke Mother   . Diabetes Mother   . Cancer Maternal Aunt     Breast Cancer  . Breast cancer Maternal Aunt   . Arthritis Maternal Grandmother   . Cancer Maternal Aunt     Lung Cancer  . Kidney disease Neg Hx   . Bladder Cancer Neg Hx     Social History Social History    Substance Use Topics  . Smoking status: Former Smoker    Years: 5.00    Quit date: 09/14/1968  . Smokeless tobacco: Never Used     Comment: quit 1970  . Alcohol use No    Allergies  Allergen Reactions  . Lyrica [Pregabalin] Other (See Comments) and Swelling    Other reaction(s): SWELLING/EDEMA Other reaction(s): SWELLING/EDEMA Sleepy, does not eat  . Neurontin [Gabapentin] Other (See Comments) and Nausea And Vomiting    Other reaction(s): Other (See Comments) Out of it Other reaction(s): Other (See Comments) Out of it Other reaction(s): OTHER Out of it  . Bactrim [Sulfamethoxazole-Trimethoprim]     Made her dizziness    Current Outpatient Prescriptions  Medication Sig Dispense Refill  . aspirin 81 MG tablet Take 81 mg by mouth daily.    . blood glucose meter kit and supplies Per patient please dispense One touch Ultra2 meter.Use meter and supplies three times a day to check blood sugar. ICD10: E11.40 1 each 0  . digoxin (LANOXIN) 0.125 MG tablet     . doxycycline (VIBRAMYCIN) 100 MG capsule Take by mouth.    . estradiol (ESTRACE VAGINAL) 0.1 MG/GM vaginal cream Apply 0.45m (pea-sized amount)  just inside the vaginal introitus with a finger-tip every night for two weeks and then Monday, Wednesday and Friday nights. 30 g 12  . furosemide (LASIX) 20 MG tablet Take 20 mg by mouth.    Marland Kitchen glucose blood (ONE TOUCH ULTRA TEST) test strip USE ONE STRIP TO CHECK GLUCOSE THREE TIMES DAILY 100 each 2  . Lactobacillus (PROBIOTIC ACIDOPHILUS PO) Take by mouth.    . liraglutide (VICTOZA) 18 MG/3ML SOPN Inject 18 mg into the skin daily.    Marland Kitchen MAGNESIUM SULFATE PO Take by mouth.    . metFORMIN (GLUCOPHAGE-XR) 500 MG 24 hr tablet Take 1 tablet (500 mg total) by mouth daily with breakfast. 90 tablet 1  . metoprolol (LOPRESSOR) 50 MG tablet Take 50 mg by mouth 2 (two) times daily.    . Multiple Vitamins-Minerals (PRESERVISION AREDS 2 PO) Take by mouth.    . vitamin B-12 (CYANOCOBALAMIN) 1000 MCG  tablet Take 1,000 mcg by mouth daily.     No current facility-administered medications for this visit.     Review of Systems Review of Systems  Constitutional: Negative.   Respiratory: Negative.   Cardiovascular: Negative.     Blood pressure 130/78, pulse 68, resp. rate 14, height 5' 2"  (1.575 m), weight 160 lb (72.6 kg).  Physical Exam Physical Exam  Constitutional: She is oriented to person, place, and time. She appears well-developed and well-nourished.  Pulmonary/Chest:    Lymphadenopathy:    She has axillary adenopathy.       Left: No supraclavicular adenopathy present.  Neurological: She is alert and oriented to person, place, and time.  Skin: Skin is warm and dry.    Data Reviewed Breast, left, needle core biopsy, 12:00 o'clock - INVASIVE MAMMARY CARCINOMA. - RADIAL SCAR. - SEE COMMENT. Microscopic Comment There is a microscopic of grade 1 invasive mammary carcinoma which is favored to be a ductal phenotype. Immunohistochemical stains for cytokeratin, calponin, myosin, and p63 are non-contributory. A breast prognostic profile will be attempted and the results reported separately. Dr. Bary Castilla was paged on 12/24/2016. Dr. Tresa Moore has reviewed the case and concurs with this interpretation. (JBK:ah 12/24/16) Enid Cutter MD Pathologist, Electronic Signature (Case signed 12/25/2016) Specimen Gross and Clinical Information ER/PR negative. Insufficient tumor for additional testing  Assessment    1 mm invasive carcinoma, ER/PR negative.    Plan    My recommendation at present is to allow the hematoma to resolve. I don't believe that additional surgical resection will be of benefit. The potential for metastatic disease to the axillary nodes with a 1 mm tumor is in the low single digits. Observation has been recommended.  Opportunity for second surgical opinion was reviewed. Opportunity for medical oncology assessment discussed.  The patient will continue to use local  heat and wear her bra for support as long as it provides more comfort.    Patient to return in one month. HPI, Physical Exam, Assessment and Plan have been scribed under the direction and in the presence of Hervey Ard, MD.  Gaspar Cola, CMA  I have completed the exam and reviewed the above documentation for accuracy and completeness.  I agree with the above.  Haematologist has been used and any errors in dictation or transcription are unintentional.  Hervey Ard, M.D., F.A.C.S.   [name] 01/07/2017, 7:03 PM

## 2017-01-06 NOTE — Patient Instructions (Signed)
Patient to return in one month. 

## 2017-01-07 DIAGNOSIS — E1142 Type 2 diabetes mellitus with diabetic polyneuropathy: Secondary | ICD-10-CM | POA: Diagnosis not present

## 2017-01-07 DIAGNOSIS — Z794 Long term (current) use of insulin: Secondary | ICD-10-CM | POA: Diagnosis not present

## 2017-01-18 DIAGNOSIS — H353221 Exudative age-related macular degeneration, left eye, with active choroidal neovascularization: Secondary | ICD-10-CM | POA: Diagnosis not present

## 2017-01-19 DIAGNOSIS — H353211 Exudative age-related macular degeneration, right eye, with active choroidal neovascularization: Secondary | ICD-10-CM | POA: Diagnosis not present

## 2017-01-20 ENCOUNTER — Encounter: Payer: Self-pay | Admitting: Podiatry

## 2017-01-20 ENCOUNTER — Ambulatory Visit (INDEPENDENT_AMBULATORY_CARE_PROVIDER_SITE_OTHER): Payer: Medicare Other | Admitting: Podiatry

## 2017-01-20 DIAGNOSIS — M79609 Pain in unspecified limb: Secondary | ICD-10-CM | POA: Diagnosis not present

## 2017-01-20 DIAGNOSIS — Q828 Other specified congenital malformations of skin: Secondary | ICD-10-CM

## 2017-01-20 DIAGNOSIS — B351 Tinea unguium: Secondary | ICD-10-CM

## 2017-01-20 DIAGNOSIS — E1142 Type 2 diabetes mellitus with diabetic polyneuropathy: Secondary | ICD-10-CM

## 2017-01-20 NOTE — Progress Notes (Signed)
She presents today with chief complaint of painful elongated toenails and calluses plantar aspect of bilateral foot.  Objective: Vital signs stable she is alert and oriented 3 Sensory was intact. Tone is along thick L dystrophic with mycotic bilateral. Reactive hyperkeratosis plantar aspect of bilateral foot particularly beneath the second metatarsophalangeal joint and the fifth metatarsal base on the right foot.  Assessment: Pain elicited onychomycosis and porokeratosis.  Plan: Debridement of the area bilaterally and debridement of toenails 1 through 5 bilateral.

## 2017-02-03 ENCOUNTER — Ambulatory Visit (INDEPENDENT_AMBULATORY_CARE_PROVIDER_SITE_OTHER): Payer: Medicare Other | Admitting: General Surgery

## 2017-02-03 ENCOUNTER — Encounter: Payer: Self-pay | Admitting: General Surgery

## 2017-02-03 VITALS — BP 132/62 | HR 80 | Resp 14 | Ht 62.5 in | Wt 159.0 lb

## 2017-02-03 DIAGNOSIS — Z170 Estrogen receptor positive status [ER+]: Secondary | ICD-10-CM

## 2017-02-03 DIAGNOSIS — N6489 Other specified disorders of breast: Secondary | ICD-10-CM

## 2017-02-03 DIAGNOSIS — C50412 Malignant neoplasm of upper-outer quadrant of left female breast: Secondary | ICD-10-CM

## 2017-02-03 NOTE — Progress Notes (Signed)
Patient ID: Holly Rose, female   DOB: May 18, 1930, 83 y.o.   MRN: [id number]  Chief Complaint  Patient presents with  . Follow-up    HPI Holly Rose is a 83 y.o. female.  Here today for follow up left breast biopsy done 12-23-16. She states she is doing well, the area is still swollen. Emelia Salisbury daughter in law is present at visit.  She has a 70th wedding anniversary coming up.  HPI  Past Medical History:  Diagnosis Date  . Allergy   . Anemia   . Arthritis   . Atrial fibrillation ([place]) 2004  . CAD (coronary artery disease)   . Cancer ([place])    H/O skin cancer, squamous  . Diabetes ([place])   . Heart attack ([place]) 2008  . Heart murmur   . History of sick sinus syndrome    s/p pacemaker placement  . Hyperlipidemia   . Kidney calculi 11/2016  . Macular degeneration   . Neuropathy   . Neuropathy   . Pacemaker 06/2010  . TIA (transient ischemic attack)     Past Surgical History:  Procedure Laterality Date  . ABDOMINAL HYSTERECTOMY  1960's  . APPENDECTOMY  1947  . [place]  . BREAST BIOPSY Left 12/17/2016   SINGLE FRAGMENT OF ATYPICAL EPITHELIAL CELLS  . BREAST EXCISIONAL BIOPSY Left 01/18/1986   Benign microcalcifications, [employer].  Marland Kitchen BREAST SURGERY  1988  . CARDIAC CATHETERIZATION  1989  . CATARACT EXTRACTION  1997 and 1998  . COLONOSCOPY WITH PROPOFOL N/A 09/12/2015   Procedure: COLONOSCOPY WITH PROPOFOL;  Surgeon: [name], MD;  Location: [facility] END[facility]COPY;  Service: Endoscopy;  Laterality: N/A;  . KIDNEY STONE SURGERY  2018  . pace maker  2008  . [place]  . TONSILLECTOMY AND ADENOIDECTOMY  1950"s    Family History  Problem Relation Age of Onset  . Stroke Mother   . Diabetes Mother   . Cancer Maternal Aunt        Breast Cancer  . Breast cancer Maternal Aunt   . Arthritis Maternal Grandmother   . Cancer Maternal Aunt        Lung Cancer  . Kidney disease Neg Hx   . Bladder Cancer Neg Hx      Social History Social History  Substance Use Topics  . Smoking status: Former Smoker    Years: 5.00    Quit date: 09/14/1968  . Smokeless tobacco: Never Used     Comment: quit 1970  . Alcohol use No    Allergies  Allergen Reactions  . Lyrica [Pregabalin] Other (See Comments) and Swelling    Other reaction(s): SWELLING/EDEMA Other reaction(s): SWELLING/EDEMA Sleepy, does not eat  . Neurontin [Gabapentin] Other (See Comments) and Nausea And Vomiting    Other reaction(s): Other (See Comments) Out of it Other reaction(s): Other (See Comments) Out of it Other reaction(s): OTHER Out of it  . Bactrim [Sulfamethoxazole-Trimethoprim]     Made her dizziness    Current Outpatient Prescriptions  Medication Sig Dispense Refill  . aspirin 81 MG tablet Take 81 mg by mouth daily.    . blood glucose meter kit and supplies Per patient please dispense One touch Ultra2 meter.Use meter and supplies three times a day to check blood sugar. ICD10: E11.40 1 each 0  . digoxin (LANOXIN) 0.125 MG tablet     . estradiol (ESTRACE VAGINAL) 0.1 MG/GM vaginal cream Apply 0.33m (pea-sized amount)  just  inside the vaginal introitus with a finger-tip every night for two weeks and then Monday, Wednesday and Friday nights. 30 g 12  . furosemide (LASIX) 20 MG tablet Take 20 mg by mouth.    Marland Kitchen glucose blood (ONE TOUCH ULTRA TEST) test strip USE ONE STRIP TO CHECK GLUCOSE THREE TIMES DAILY 100 each 2  . Lactobacillus (PROBIOTIC ACIDOPHILUS PO) Take by mouth.    . liraglutide (VICTOZA) 18 MG/3ML SOPN Inject 18 mg into the skin daily.    Marland Kitchen MAGNESIUM SULFATE PO Take by mouth.    . metFORMIN (GLUCOPHAGE-XR) 500 MG 24 hr tablet Take 1 tablet (500 mg total) by mouth daily with breakfast. 90 tablet 1  . metoprolol (LOPRESSOR) 50 MG tablet Take 50 mg by mouth 2 (two) times daily.    . Multiple Vitamins-Minerals (PRESERVISION AREDS 2 PO) Take by mouth.    . vitamin B-12 (CYANOCOBALAMIN) 1000 MCG tablet Take 1,000 mcg by  mouth daily.     No current facility-administered medications for this visit.     Review of Systems Review of Systems  Blood pressure 132/62, pulse 80, resp. rate 14, height 5' 2.5" (1.588 m), weight 159 lb (72.1 kg).  Physical Exam Physical Exam  Constitutional: She is oriented to person, place, and time. She appears well-developed and well-nourished.  HENT:  Mouth/Throat: Oropharynx is clear and moist.  Eyes: Conjunctivae are normal. No scleral icterus.  Neck: Neck supple.  Cardiovascular: Normal rate and normal heart sounds.  An irregular rhythm present.  Pulmonary/Chest: Effort normal and breath sounds normal.    Hematoma left breast  Lymphadenopathy:    She has no cervical adenopathy.    She has axillary adenopathy.  Left axillary node 8 mm  Neurological: She is alert and oriented to person, place, and time.  Skin: Skin is warm and dry.  Psychiatric: Her behavior is normal.    Data Reviewed Breast, left, needle core biopsy, 12:00 o'clock - INVASIVE MAMMARY CARCINOMA. - RADIAL SCAR. - SEE COMMENT. Microscopic Comment There is a microscopic of grade 1 invasive mammary carcinoma which is favored to be a ductal phenotype. Immunohistochemical stains for cytokeratin, calponin, myosin, and p63 are non-contributory. ER/PR negative. Insufficient sample for HER-2 testing.  Assessment    Gradual resolution of postbiopsy hematoma.    Plan    It may be reasonable to complete formal excision of this area area with her upcoming anniversary should like to delay any decision.     Follow up in 2 months. The patient is aware to call back for any questions or concerns.     HPI, Physical Exam, Assessment and Plan have been scribed under the direction and in the presence of [name], MD.  Karie Fetch, RN  I have completed the exam and reviewed the above documentation for accuracy and completeness.  I agree with the above.  Haematologist has been used and any  errors in dictation or transcription are unintentional.  Hervey Ard, M.D., F.A.C.S.  [name] 02/03/2017, 8:54 PM

## 2017-02-03 NOTE — Patient Instructions (Signed)
The patient is aware to call back for any questions or concerns.  

## 2017-02-11 DIAGNOSIS — E1142 Type 2 diabetes mellitus with diabetic polyneuropathy: Secondary | ICD-10-CM | POA: Diagnosis not present

## 2017-02-17 DIAGNOSIS — D0472 Carcinoma in situ of skin of left lower limb, including hip: Secondary | ICD-10-CM | POA: Diagnosis not present

## 2017-02-17 DIAGNOSIS — L570 Actinic keratosis: Secondary | ICD-10-CM | POA: Diagnosis not present

## 2017-02-17 DIAGNOSIS — D0439 Carcinoma in situ of skin of other parts of face: Secondary | ICD-10-CM | POA: Diagnosis not present

## 2017-02-17 DIAGNOSIS — D485 Neoplasm of uncertain behavior of skin: Secondary | ICD-10-CM | POA: Diagnosis not present

## 2017-02-17 DIAGNOSIS — X32XXXA Exposure to sunlight, initial encounter: Secondary | ICD-10-CM | POA: Diagnosis not present

## 2017-02-22 ENCOUNTER — Other Ambulatory Visit: Payer: Self-pay

## 2017-02-22 MED ORDER — METFORMIN HCL ER 500 MG PO TB24: 500.0000 mg | ORAL_TABLET | Freq: Every day | ORAL | 1 refills | Status: DC

## 2017-03-01 DIAGNOSIS — H353221 Exudative age-related macular degeneration, left eye, with active choroidal neovascularization: Secondary | ICD-10-CM | POA: Diagnosis not present

## 2017-03-09 ENCOUNTER — Ambulatory Visit: Payer: BLUE CROSS/BLUE SHIELD | Admitting: Internal Medicine

## 2017-03-09 ENCOUNTER — Encounter: Payer: Self-pay | Admitting: Internal Medicine

## 2017-03-09 ENCOUNTER — Ambulatory Visit (INDEPENDENT_AMBULATORY_CARE_PROVIDER_SITE_OTHER): Payer: Medicare Other | Admitting: Internal Medicine

## 2017-03-09 DIAGNOSIS — I2510 Atherosclerotic heart disease of native coronary artery without angina pectoris: Secondary | ICD-10-CM | POA: Diagnosis not present

## 2017-03-09 DIAGNOSIS — Z170 Estrogen receptor positive status [ER+]: Secondary | ICD-10-CM

## 2017-03-09 DIAGNOSIS — E1140 Type 2 diabetes mellitus with diabetic neuropathy, unspecified: Secondary | ICD-10-CM | POA: Diagnosis not present

## 2017-03-09 DIAGNOSIS — R1084 Generalized abdominal pain: Secondary | ICD-10-CM | POA: Diagnosis not present

## 2017-03-09 DIAGNOSIS — C50412 Malignant neoplasm of upper-outer quadrant of left female breast: Secondary | ICD-10-CM

## 2017-03-09 DIAGNOSIS — I4891 Unspecified atrial fibrillation: Secondary | ICD-10-CM

## 2017-03-09 DIAGNOSIS — Z87442 Personal history of urinary calculi: Secondary | ICD-10-CM | POA: Diagnosis not present

## 2017-03-09 DIAGNOSIS — F439 Reaction to severe stress, unspecified: Secondary | ICD-10-CM | POA: Diagnosis not present

## 2017-03-09 DIAGNOSIS — H353211 Exudative age-related macular degeneration, right eye, with active choroidal neovascularization: Secondary | ICD-10-CM | POA: Diagnosis not present

## 2017-03-09 DIAGNOSIS — E7800 Pure hypercholesterolemia, unspecified: Secondary | ICD-10-CM | POA: Diagnosis not present

## 2017-03-09 DIAGNOSIS — Z0000 Encounter for general adult medical examination without abnormal findings: Secondary | ICD-10-CM | POA: Diagnosis not present

## 2017-03-09 DIAGNOSIS — E1142 Type 2 diabetes mellitus with diabetic polyneuropathy: Secondary | ICD-10-CM | POA: Diagnosis not present

## 2017-03-09 LAB — HM DIABETES FOOT EXAM

## 2017-03-09 NOTE — Progress Notes (Signed)
Patient ID: Holly Rose, female   DOB: November 05, 1929, 83 y.o.   MRN: [id number]   Subjective:    Patient ID: Holly Rose, female    DOB: [birth year], 83 y.o.   MRN: [id number]  HPI  Patient with past history of diabetes, hypercholesterolemia and recent diagnosis of breast cancer.  She comes in today to follow up on these issues as well as for a complete physical exam.  She is accompanied by her daughter-n-law.  History obtained from both of them.  She reports she is doing relatively well.  Appears to be handling stress relatively well.  No chest pain.  Breathing stable.  Seeing endocrinology.  On victoza.  Has lost some weight.  a1c decreased to 7.8.  Being followed by Dr Bary Castilla for her breast.   Note reviewed.  Persistent breast lump.  No acid reflux.  Overall bowels doing better.  No abdominal pain now.  Persistent pain with neuropathy.    Past Medical History:  Diagnosis Date  . Allergy   . Anemia   . Arthritis   . Atrial fibrillation ([place]) 2004  . CAD (coronary artery disease)   . Cancer ([place])    H/O skin cancer, squamous  . Diabetes ([place])   . Heart attack ([place]) 2008  . Heart murmur   . History of sick sinus syndrome    s/p pacemaker placement  . Hyperlipidemia   . Kidney calculi 11/2016  . Macular degeneration   . Neuropathy   . Neuropathy   . Pacemaker 06/2010  . TIA (transient ischemic attack)    Past Surgical History:  Procedure Laterality Date  . ABDOMINAL HYSTERECTOMY  1960's  . APPENDECTOMY  1947  . [place]  . BREAST BIOPSY Left 12/17/2016   SINGLE FRAGMENT OF ATYPICAL EPITHELIAL CELLS  . BREAST EXCISIONAL BIOPSY Left 01/18/1986   Benign microcalcifications, [employer].  Marland Kitchen BREAST SURGERY  1988  . CARDIAC CATHETERIZATION  1989  . CATARACT EXTRACTION  1997 and 1998  . COLONOSCOPY WITH PROPOFOL N/A 09/12/2015   Procedure: COLONOSCOPY WITH PROPOFOL;  Surgeon: [name], MD;  Location: [facility] ENDOSCOPY;  Service: Endoscopy;   Laterality: N/A;  . KIDNEY STONE SURGERY  2018  . pace maker  2008  . [place]  . TONSILLECTOMY AND ADENOIDECTOMY  1950"s   Family History  Problem Relation Age of Onset  . Stroke Mother   . Diabetes Mother   . Cancer Maternal Aunt        Breast Cancer  . Breast cancer Maternal Aunt   . Arthritis Maternal Grandmother   . Cancer Maternal Aunt        Lung Cancer  . Kidney disease Neg Hx   . Bladder Cancer Neg Hx    Social History   Social History  . Marital status: Married    Spouse name: N/A  . Number of children: N/A  . Years of education: N/A   Social History Main Topics  . Smoking status: Former Smoker    Years: 5.00    Quit date: 09/14/1968  . Smokeless tobacco: Never Used     Comment: quit 1970  . Alcohol use No  . Drug use: No  . Sexual activity: Not Asked   Other Topics Concern  . None   Social History Narrative  . None    Outpatient Encounter Prescriptions as of 03/09/2017  Medication Sig  . aspirin 81 MG tablet Take 81 mg by  mouth daily.  . blood glucose meter kit and supplies Per patient please dispense One touch Ultra2 meter.Use meter and supplies three times a day to check blood sugar. ICD10: E11.40  . digoxin (LANOXIN) 0.125 MG tablet   . furosemide (LASIX) 20 MG tablet Take 20 mg by mouth.  Marland Kitchen glucose blood (ONE TOUCH ULTRA TEST) test strip USE ONE STRIP TO CHECK GLUCOSE THREE TIMES DAILY  . Lactobacillus (PROBIOTIC ACIDOPHILUS PO) Take by mouth.  . liraglutide (VICTOZA) 18 MG/3ML SOPN Inject 18 mg into the skin daily.  Marland Kitchen MAGNESIUM SULFATE PO Take by mouth.  . metFORMIN (GLUCOPHAGE-XR) 500 MG 24 hr tablet Take 1 tablet (500 mg total) by mouth daily with breakfast.  . metoprolol (LOPRESSOR) 50 MG tablet Take 50 mg by mouth 2 (two) times daily.  . Multiple Vitamins-Minerals (PRESERVISION AREDS 2 PO) Take by mouth.  . vitamin B-12 (CYANOCOBALAMIN) 1000 MCG tablet Take 1,000 mcg by mouth daily.  . [DISCONTINUED] estradiol (ESTRACE VAGINAL)  0.1 MG/GM vaginal cream Apply 0.60m (pea-sized amount)  just inside the vaginal introitus with a finger-tip every night for two weeks and then Monday, Wednesday and Friday nights.   No facility-administered encounter medications on file as of 03/09/2017.     Review of Systems  Constitutional: Negative for appetite change and unexpected weight change.  HENT: Negative for congestion and sinus pressure.   Eyes: Negative for pain and visual disturbance.  Respiratory: Negative for cough, chest tightness and shortness of breath.   Cardiovascular: Negative for chest pain, palpitations and leg swelling.  Gastrointestinal: Negative for abdominal pain, diarrhea, nausea and vomiting.  Genitourinary: Negative for difficulty urinating and dysuria.  Musculoskeletal: Negative for back pain and joint swelling.  Skin: Negative for color change and rash.  Neurological: Negative for dizziness, light-headedness and headaches.       Neuropathy pain as outliend.    Hematological: Negative for adenopathy. Does not bruise/bleed easily.  Psychiatric/Behavioral: Negative for agitation and dysphoric mood.       Objective:    Physical Exam  Constitutional: She appears well-developed and well-nourished. No distress.  HENT:  Nose: Nose normal.  Mouth/Throat: Oropharynx is clear and moist.  Eyes: Conjunctivae are normal. Right eye exhibits no discharge. Left eye exhibits no discharge.  Neck: Neck supple. No thyromegaly present.  Cardiovascular: Normal rate.   Rate controlled.   Pulmonary/Chest: Breath sounds normal. No respiratory distress. She has no wheezes.  Breast exam:  No nipple discharge or nipple retraction present.  Nodule - left breast.  No palpable axillary adenopathy.    Abdominal: Soft. Bowel sounds are normal. There is no tenderness.  Musculoskeletal: She exhibits no edema or tenderness.  Feet - intact to light touch. Some decreased sensation to pinprick - distally.   Lymphadenopathy:    She has  no cervical adenopathy.  Skin: No rash noted. No erythema.  Psychiatric: She has a normal mood and affect. Her behavior is normal.    BP 130/64 (BP Location: Left Arm, Patient Position: Sitting, Cuff Size: Normal)   Pulse 62   Temp 98.6 F (37 C) (Oral)   Resp 12   Ht 5' 3"  (1.6 m)   Wt 156 lb 9.6 oz (71 kg)   SpO2 96%   BMI 27.74 kg/m  Wt Readings from Last 3 Encounters:  03/09/17 156 lb 9.6 oz (71 kg)  02/03/17 159 lb (72.1 kg)  01/06/17 160 lb (72.6 kg)     Lab Results  Component Value Date   WBC 5.7 08/20/2016  HGB 13.0 08/20/2016   HCT 41.2 08/20/2016   PLT 180 08/20/2016   GLUCOSE 244 (H) 08/20/2016   CHOL 193 08/20/2016   TRIG 115 08/20/2016   HDL 68 08/20/2016   LDLCALC 102 (H) 08/20/2016   ALT 21 08/20/2016   AST 20 08/20/2016   NA 135 08/20/2016   K 4.3 08/20/2016   CL 97 08/20/2016   CREATININE 0.63 08/20/2016   BUN 10 08/20/2016   CO2 22 08/20/2016   TSH 3.22 03/09/2016   HGBA1C 10.5 (H) 08/20/2016   MICROALBUR 65.4 (H) 03/09/2016    US Breast Ltd Uni Left Inc Axilla  Result Date: 12/07/2016 CLINICAL DATA:  Palpable abnormality in the left breast. EXAM: 2D DIGITAL DIAGNOSTIC LEFT MAMMOGRAM WITH CAD AND ADJUNCT TOMO ULTRASOUND LEFT BREAST COMPARISON:  New baseline; earlier studies performed at [facility] are not available. ACR Breast Density Category a: The breast tissue is almost entirely fatty. FINDINGS: Within the upper central portion of the left breast, there is a partially rounded mass with partially circumscribed, partially indistinct margins measuring approximately 1.6 cm mammographically. Mammographic images were processed with CAD. On physical exam, I palpate a rounded mobile mass in the 12 o'clock location of the left breast. Targeted ultrasound is performed, showing an oval parallel mass with posterior acoustic enhancement. Image R.T. the margins are circumscribed. Some margins are angular however. The mass is partially anechoic and partially  hypoechoic, and measures 1.6 x 1.0 x 1.6 cm. Evaluation of the left axilla demonstrates a single lymph node with prominent cortex. Other lymph nodes have normal morphology. IMPRESSION: 1. Suspicious cystic and solid mass in the 12 o'clock location of the left breast for which biopsy is indicated. 2. Mildly prominent left axillary lymph node with thickened cortex. RECOMMENDATION: 1. Ultrasound-guided core biopsy of mass in the 12 o'clock location of the left breast. 2. Ultrasound-guided core biopsy of left axillary lymph node. I have discussed the findings and recommendations with the patient and her daughter in law. Results were also provided in writing at the conclusion of the visit. If applicable, a reminder letter will be sent to the patient regarding the next appointment. BI-RADS CATEGORY  4: Suspicious. Electronically Signed   By: Nolon Nations M.D.   On: 12/07/2016 14:41   Mm Diag Breast Tomo Bilateral  Result Date: 12/07/2016 CLINICAL DATA:  Palpable abnormality in the left breast. EXAM: 2D DIGITAL DIAGNOSTIC LEFT MAMMOGRAM WITH CAD AND ADJUNCT TOMO ULTRASOUND LEFT BREAST COMPARISON:  New baseline; earlier studies performed at [facility] are not available. ACR Breast Density Category a: The breast tissue is almost entirely fatty. FINDINGS: Within the upper central portion of the left breast, there is a partially rounded mass with partially circumscribed, partially indistinct margins measuring approximately 1.6 cm mammographically. Mammographic images were processed with CAD. On physical exam, I palpate a rounded mobile mass in the 12 o'clock location of the left breast. Targeted ultrasound is performed, showing an oval parallel mass with posterior acoustic enhancement. Image R.T. the margins are circumscribed. Some margins are angular however. The mass is partially anechoic and partially hypoechoic, and measures 1.6 x 1.0 x 1.6 cm. Evaluation of the left axilla demonstrates a single lymph node with prominent  cortex. Other lymph nodes have normal morphology. IMPRESSION: 1. Suspicious cystic and solid mass in the 12 o'clock location of the left breast for which biopsy is indicated. 2. Mildly prominent left axillary lymph node with thickened cortex. RECOMMENDATION: 1. Ultrasound-guided core biopsy of mass in the 12 o'clock location of the  left breast. 2. Ultrasound-guided core biopsy of left axillary lymph node. I have discussed the findings and recommendations with the patient and her daughter in law. Results were also provided in writing at the conclusion of the visit. If applicable, a reminder letter will be sent to the patient regarding the next appointment. BI-RADS CATEGORY  4: Suspicious. Electronically Signed   By: Nolon Nations M.D.   On: 12/07/2016 14:41       Assessment & Plan:   Problem List Items Addressed This Visit    A-fib [facility])    Has pacemaker in place.  Has been followed by Dr Ubaldo Glassing.  Stable.        Abdominal pain    None now.  Has been better.  Has had extensive w/up as outlined.  Follow.        CAD (coronary artery disease)    Stable.  Has seen cardiology.       Diabetic neuropathy (HCC)    Tried alpha lipoic acid.  Did not help.  Follow.        Health care maintenance    Physical today 03/09/17.  Mammogram and breast being followed by Dr Bary Castilla.  Colonoscopy 09/12/15.       History of kidney stones    Seeing urology.  S/p removal.  Doing well.        Hypercholesterolemia    Low cholesterol diet and exercise.  Follow lipid panel.        Malignant neoplasm of upper-outer quadrant of left breast in female, estrogen receptor positive ([place])    Being followed by Dr Bary Castilla.        Stress    Appears to be doing well.  Follow.        Type 2 diabetes mellitus with diabetic neuropathy [facility])    Seeing endocrinology now.  On victoza.  Last a1c 7.8.  Low carb diet and exercise.  Follow met b and a1c.            [name], MD

## 2017-03-09 NOTE — Progress Notes (Signed)
Pre-visit discussion using our clinic review tool. No additional management support is needed unless otherwise documented below in the visit note.  

## 2017-03-12 ENCOUNTER — Encounter: Payer: Self-pay | Admitting: Internal Medicine

## 2017-03-12 DIAGNOSIS — Z87442 Personal history of urinary calculi: Secondary | ICD-10-CM | POA: Insufficient documentation

## 2017-03-12 NOTE — Assessment & Plan Note (Signed)
Seeing urology.  S/p removal.  Doing well.

## 2017-03-12 NOTE — Assessment & Plan Note (Signed)
Has pacemaker in place.  Has been followed by Dr Ubaldo Glassing.  Stable.

## 2017-03-12 NOTE — Assessment & Plan Note (Signed)
Being followed by Dr Bary Castilla.

## 2017-03-12 NOTE — Assessment & Plan Note (Signed)
Stable.  Has seen cardiology.

## 2017-03-12 NOTE — Assessment & Plan Note (Signed)
Physical today 03/09/17.  Mammogram and breast being followed by Dr Bary Castilla.  Colonoscopy 09/12/15.

## 2017-03-12 NOTE — Assessment & Plan Note (Signed)
Low cholesterol diet and exercise.  Follow lipid panel.   

## 2017-03-12 NOTE — Assessment & Plan Note (Signed)
None now.  Has been better.  Has had extensive w/up as outlined.  Follow.

## 2017-03-12 NOTE — Assessment & Plan Note (Signed)
Tried alpha lipoic acid.  Did not help.  Follow.

## 2017-03-12 NOTE — Assessment & Plan Note (Signed)
Seeing endocrinology now.  On victoza.  Last a1c 7.8.  Low carb diet and exercise.  Follow met b and a1c.

## 2017-03-12 NOTE — Assessment & Plan Note (Signed)
Appears to be doing well.  Follow.   

## 2017-03-21 ENCOUNTER — Other Ambulatory Visit: Payer: Self-pay | Admitting: Internal Medicine

## 2017-03-25 DIAGNOSIS — D0472 Carcinoma in situ of skin of left lower limb, including hip: Secondary | ICD-10-CM | POA: Diagnosis not present

## 2017-03-29 DIAGNOSIS — L578 Other skin changes due to chronic exposure to nonionizing radiation: Secondary | ICD-10-CM | POA: Diagnosis not present

## 2017-03-29 DIAGNOSIS — L908 Other atrophic disorders of skin: Secondary | ICD-10-CM | POA: Diagnosis not present

## 2017-03-29 DIAGNOSIS — Z85828 Personal history of other malignant neoplasm of skin: Secondary | ICD-10-CM | POA: Diagnosis not present

## 2017-03-29 DIAGNOSIS — D099 Carcinoma in situ, unspecified: Secondary | ICD-10-CM | POA: Diagnosis not present

## 2017-03-29 DIAGNOSIS — C44320 Squamous cell carcinoma of skin of unspecified parts of face: Secondary | ICD-10-CM | POA: Insufficient documentation

## 2017-03-29 DIAGNOSIS — L814 Other melanin hyperpigmentation: Secondary | ICD-10-CM | POA: Diagnosis not present

## 2017-04-08 ENCOUNTER — Ambulatory Visit (INDEPENDENT_AMBULATORY_CARE_PROVIDER_SITE_OTHER): Payer: Medicare Other | Admitting: General Surgery

## 2017-04-08 ENCOUNTER — Encounter: Payer: Self-pay | Admitting: General Surgery

## 2017-04-08 ENCOUNTER — Inpatient Hospital Stay: Payer: Self-pay

## 2017-04-08 VITALS — BP 152/74 | HR 96 | Resp 14 | Ht 62.5 in | Wt 161.0 lb

## 2017-04-08 DIAGNOSIS — C50412 Malignant neoplasm of upper-outer quadrant of left female breast: Secondary | ICD-10-CM

## 2017-04-08 DIAGNOSIS — I2510 Atherosclerotic heart disease of native coronary artery without angina pectoris: Secondary | ICD-10-CM

## 2017-04-08 DIAGNOSIS — Z170 Estrogen receptor positive status [ER+]: Principal | ICD-10-CM

## 2017-04-08 HISTORY — PX: BREAST BIOPSY: SHX20

## 2017-04-08 NOTE — Progress Notes (Signed)
Patient ID: Holly Rose, female   DOB: 12/06/1929, 83 y.o.   MRN: [id number]  Chief Complaint  Patient presents with  . Breast Cancer    HPI Holly Rose is a 83 y.o. female.  Here today for follow up left breast biopsy done 12-23-16. She developed a post biopsy hematoma. She states she is doing well, and the lump is still swollen, no change. Holly Rose daughter in law is present at visit.  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 She has an appointment with Dr Ubaldo Glassing for pacemaker check up.  HPI  Past Medical History:  Diagnosis Date  . Allergy   . Anemia   . Arthritis   . Atrial fibrillation ([place]) 2004  . CAD (coronary artery disease)   . Cancer ([place])    H/O skin cancer, squamous  . Diabetes ([place])   . Heart attack ([place]) 2008  . Heart murmur   . History of sick sinus syndrome    s/p pacemaker placement  . Hyperlipidemia   . Kidney calculi 11/2016  . Macular degeneration   . Neuropathy   . Neuropathy   . Pacemaker 06/2010  . TIA (transient ischemic attack)     Past Surgical History:  Procedure Laterality Date  . ABDOMINAL HYSTERECTOMY  1960's  . APPENDECTOMY  1947  . [name]  . BREAST BIOPSY Left 12/17/2016   SINGLE FRAGMENT OF ATYPICAL EPITHELIAL CELLS  . BREAST EXCISIONAL BIOPSY Left 01/18/1986   Benign microcalcifications, [employer].  Marland Kitchen BREAST SURGERY  1988  . CARDIAC CATHETERIZATION  1989  . CATARACT EXTRACTION  1997 and 1998  . COLONOSCOPY WITH PROPOFOL N/A 09/12/2015   Procedure: COLONOSCOPY WITH PROPOFOL;  Surgeon: [name], MD;  Location: [facility] ENDOSCOPY;  Service: Endoscopy;  Laterality: N/A;  . KIDNEY STONE SURGERY  2018  . pace maker  2008  . [place]  . TONSILLECTOMY AND ADENOIDECTOMY  1950"s    Family History  Problem Relation Age of Onset  . Stroke Mother   . Diabetes Mother   . Cancer Maternal Aunt        Breast Cancer  . Breast cancer  Maternal Aunt   . Arthritis Maternal Grandmother   . Cancer Maternal Aunt        Lung Cancer  . Kidney disease Neg Hx   . Bladder Cancer Neg Hx     Social History Social History  Substance Use Topics  . Smoking status: Former Smoker    Years: 5.00    Quit date: 09/14/1968  . Smokeless tobacco: Never Used     Comment: quit 1970  . Alcohol use No    Allergies  Allergen Reactions  . Lyrica [Pregabalin] Other (See Comments) and Swelling    Other reaction(s): SWELLING/EDEMA Other reaction(s): SWELLING/EDEMA Sleepy, does not eat  . Neurontin [Gabapentin] Other (See Comments) and Nausea And Vomiting    Other reaction(s): Other (See Comments) Out of it Other reaction(s): Other (See Comments) Out of it Other reaction(s): OTHER Out of it  . Bactrim [Sulfamethoxazole-Trimethoprim]     Made her dizziness    Current Outpatient Prescriptions  Medication Sig Dispense Refill  . aspirin 81 MG tablet Take 81 mg by mouth daily.    . blood glucose meter kit and supplies Per patient please dispense One touch Ultra2 meter.Use meter and supplies three times a day to check blood sugar. ICD10: E11.40 1 each 0  .  digoxin (LANOXIN) 0.125 MG tablet     . furosemide (LASIX) 20 MG tablet Take 20 mg by mouth.    Marland Kitchen glucose blood (ONE TOUCH ULTRA TEST) test strip USE ONE STRIP TO CHECK GLUCOSE THREE TIMES DAILY 100 each 2  . Lactobacillus (PROBIOTIC ACIDOPHILUS PO) Take by mouth.    Marland Kitchen MAGNESIUM SULFATE PO Take by mouth.    . metFORMIN (GLUCOPHAGE-XR) 500 MG 24 hr tablet Take 1 tablet (500 mg total) by mouth daily with breakfast. 90 tablet 1  . metFORMIN (GLUCOPHAGE-XR) 500 MG 24 hr tablet TAKE 2 TABLETS BY MOUTH TWICE DAILY 120 tablet 2  . metoprolol (LOPRESSOR) 50 MG tablet Take 50 mg by mouth 2 (two) times daily.    . Multiple Vitamins-Minerals (PRESERVISION AREDS 2 PO) Take by mouth.    . vitamin B-12 (CYANOCOBALAMIN) 1000 MCG tablet Take 1,000 mcg by mouth daily.    Marland Kitchen liraglutide (VICTOZA) 18  MG/3ML SOPN Inject 18 mg into the skin daily.     No current facility-administered medications for this visit.     Review of Systems Review of Systems  Constitutional: Negative.   Respiratory: Negative.   Cardiovascular: Negative.     Blood pressure (!) 152/74, pulse 96, resp. rate 14, height 5' 2.5" (1.588 m), weight 161 lb (73 kg).  Physical Exam Physical Exam  Constitutional: She is oriented to person, place, and time. She appears well-developed and well-nourished.  HENT:  Mouth/Throat: Oropharynx is clear and moist.  Eyes: Conjunctivae are normal. No scleral icterus.  Neck: Neck supple.  Cardiovascular: Normal rate and normal heart sounds.  An irregular rhythm present.  Pulmonary/Chest: Effort normal and breath sounds normal. Right breast exhibits no inverted nipple, no mass, no nipple discharge, no skin change and no tenderness. Left breast exhibits no inverted nipple, no mass, no nipple discharge, no skin change and no tenderness.  Little thickening at 12 o'clock left breast. Hematoma area much softer.   Abdominal: Soft.  Lymphadenopathy:    She has no cervical adenopathy.    She has no axillary adenopathy.  Neurological: She is alert and oriented to person, place, and time.  Skin: Skin is warm and dry.  Psychiatric: Her behavior is normal.    Data Reviewed Ultrasound examination completed 12/07/2016 described a 1.0 x 1.6 x 1.6 cm mass mass in the 12:00 position of the left breast.  This lesion was biopsied on 12/17/2016 and a 14-gauge Finesse biopsy was utilized. Biopsy showed a single fragment of atypical ductal cells.  Repeat biopsy was completed 12/23/2016 when pathology was inconclusive. At that time, a 10-gauge Encor device was utilized with 12 mm core samples 8 obtained. Pathology showed a microscopic focus of invasive cancer within a radial scar. ER/PR negative. Inadequate sample for HER-2/neu analysis.  Ultrasound examination today post hematoma shows a  residual 1.18 x 1.5 x 1.7 cm hematoma cavity with a small 0.36 x 0.44 x 0.46 hypoechoic area adjacent to the hematoma cavity. No increased vascular flow noted. BI-RADS-4.    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    Plan    The patient is scheduled for pacemaker assessment with her cardiologist next week or 2. If she is not going to require generator replacement, I've recommended excision of the area under local anesthesia with sedation to completely clarify that no additional residual tumor is present. Considering that only a microscopic foci of disease was identified on 2 vacuum biopsies, I a.m. a little reluctant to proceed  to sentinel node biopsy as it's unlikely that the patient would ever be a candidate for adjuvant radiation or chemotherapy.  This proposal was reviewed with the patient and her daughter-in-law who are in agreement.  If generator changes plan, we'll try to coordinate with cardiology service to leave the biopsy at the same time.    Cardiac clearance with Dr Ubaldo Glassing as scheduled, appointment next week. Plan for surgery in new future    HPI, Physical Exam, Assessment and Plan have been scribed under the direction and in the presence of [name], MD. Karie Fetch, RN  I have completed the exam and reviewed the above documentation for accuracy and completeness.  I agree with the above.  Haematologist has been used and any errors in dictation or transcription are unintentional.  Hervey Ard, M.D., F.A.C.S.  [name] 04/08/2017, 10:02 PM

## 2017-04-08 NOTE — Patient Instructions (Addendum)
The patient is aware to call back for any questions or concerns.  Cardiac clearance with Dr Ubaldo Glassing as scheduled next week.

## 2017-04-12 DIAGNOSIS — H353221 Exudative age-related macular degeneration, left eye, with active choroidal neovascularization: Secondary | ICD-10-CM | POA: Diagnosis not present

## 2017-04-13 DIAGNOSIS — I4891 Unspecified atrial fibrillation: Secondary | ICD-10-CM | POA: Diagnosis not present

## 2017-04-16 DIAGNOSIS — I4891 Unspecified atrial fibrillation: Secondary | ICD-10-CM | POA: Diagnosis not present

## 2017-04-16 DIAGNOSIS — I341 Nonrheumatic mitral (valve) prolapse: Secondary | ICD-10-CM | POA: Diagnosis not present

## 2017-04-16 DIAGNOSIS — I481 Persistent atrial fibrillation: Secondary | ICD-10-CM | POA: Diagnosis not present

## 2017-04-16 DIAGNOSIS — E782 Mixed hyperlipidemia: Secondary | ICD-10-CM | POA: Diagnosis not present

## 2017-04-20 DIAGNOSIS — H353211 Exudative age-related macular degeneration, right eye, with active choroidal neovascularization: Secondary | ICD-10-CM | POA: Diagnosis not present

## 2017-04-26 ENCOUNTER — Ambulatory Visit: Payer: Medicare Other | Admitting: Podiatry

## 2017-04-26 ENCOUNTER — Encounter: Payer: Self-pay | Admitting: Podiatry

## 2017-04-26 ENCOUNTER — Ambulatory Visit (INDEPENDENT_AMBULATORY_CARE_PROVIDER_SITE_OTHER): Payer: Medicare Other | Admitting: Podiatry

## 2017-04-26 DIAGNOSIS — Q828 Other specified congenital malformations of skin: Secondary | ICD-10-CM

## 2017-04-26 DIAGNOSIS — M79676 Pain in unspecified toe(s): Secondary | ICD-10-CM

## 2017-04-26 DIAGNOSIS — M79609 Pain in unspecified limb: Secondary | ICD-10-CM

## 2017-04-26 DIAGNOSIS — E1142 Type 2 diabetes mellitus with diabetic polyneuropathy: Secondary | ICD-10-CM

## 2017-04-26 DIAGNOSIS — B351 Tinea unguium: Secondary | ICD-10-CM

## 2017-04-26 NOTE — Progress Notes (Signed)
She presents today concerned that her toenails are getting thick and that she is developing calluses.  Objective: Vital signs are stable she is alert and oriented 3 no open lesions or wounds are noted plantar aspect of the distal aspect of the toes. Her toenails are long thick yellow dystrophic and clinically mycotic.  Assessment: Pain limb secondary to onychomycosis and porokeratosis.  Plan: Debridement of all reactive hyperkeratotic tissue and debridement of toenails 1 through 5 bilateral.

## 2017-04-29 ENCOUNTER — Ambulatory Visit (INDEPENDENT_AMBULATORY_CARE_PROVIDER_SITE_OTHER): Payer: Medicare Other

## 2017-04-29 VITALS — BP 130/70 | HR 90 | Temp 98.4°F | Resp 14 | Ht 62.5 in | Wt 157.1 lb

## 2017-04-29 DIAGNOSIS — Z0000 Encounter for general adult medical examination without abnormal findings: Secondary | ICD-10-CM | POA: Diagnosis not present

## 2017-04-29 NOTE — Progress Notes (Signed)
Subjective:   Holly Rose is a 83 y.o. female who presents for an Initial Medicare Annual Wellness Visit.  Review of Systems    No ROS.  Medicare Wellness Visit. Additional risk factors are reflected in the social history.  Cardiac Risk Factors include: advanced age (>58mn, >>19women);diabetes mellitus     Objective:    Today's Vitals   04/29/17 1515  BP: 130/70  Pulse: 90  Resp: 14  Temp: 98.4 F (36.9 C)  TempSrc: Oral  SpO2: 97%  Weight: 157 lb 1.9 oz (71.3 kg)  Height: 5' 2.5" (1.588 m)   Body mass index is 28.28 kg/m.   Current Medications (verified) Outpatient Encounter Prescriptions as of 04/29/2017  Medication Sig  . aspirin 81 MG tablet Take 81 mg by mouth daily.  . blood glucose meter kit and supplies Per patient please dispense One touch Ultra2 meter.Use meter and supplies three times a day to check blood sugar. ICD10: E11.40  . furosemide (LASIX) 20 MG tablet Take 20 mg by mouth.  .Marland Kitchenglucose blood (ONE TOUCH ULTRA TEST) test strip USE ONE STRIP TO CHECK GLUCOSE THREE TIMES DAILY  . Lactobacillus (PROBIOTIC ACIDOPHILUS PO) Take by mouth.  .Marland KitchenMAGNESIUM SULFATE PO Take by mouth.  . metFORMIN (GLUCOPHAGE-XR) 500 MG 24 hr tablet TAKE 2 TABLETS BY MOUTH TWICE DAILY  . metoprolol (LOPRESSOR) 50 MG tablet Take 50 mg by mouth 2 (two) times daily.  . Multiple Vitamins-Minerals (PRESERVISION AREDS 2 PO) Take by mouth.  . vitamin B-12 (CYANOCOBALAMIN) 1000 MCG tablet Take 1,000 mcg by mouth daily.  . [DISCONTINUED] digoxin (LANOXIN) 0.125 MG tablet   . [DISCONTINUED] liraglutide (VICTOZA) 18 MG/3ML SOPN Inject 18 mg into the skin daily.  . [DISCONTINUED] metFORMIN (GLUCOPHAGE-XR) 500 MG 24 hr tablet Take 1 tablet (500 mg total) by mouth daily with breakfast.   No facility-administered encounter medications on file as of 04/29/2017.     Allergies (verified) Lyrica [pregabalin]; Neurontin [gabapentin]; and Bactrim [sulfamethoxazole-trimethoprim]    History: Past Medical History:  Diagnosis Date  . Allergy   . Anemia   . Arthritis   . Atrial fibrillation ([place] 2004  . CAD (coronary artery disease)   . Cancer ([place]    H/O skin cancer, squamous  . Diabetes ([place]   . Heart attack ([place]  . Heart murmur   . History of sick sinus syndrome    s/p pacemaker placement  . Hyperlipidemia   . Kidney calculi 11/2016  . Macular degeneration   . Neuropathy   . Neuropathy   . Pacemaker 06/2010  . TIA (transient ischemic attack)    Past Surgical History:  Procedure Laterality Date  . ABDOMINAL HYSTERECTOMY  1960's  . [place]EN[place]CTOMY  1947  [place] BREAST BIOPSY Left 12/17/2016   SINGLE FRAGMENT OF ATYPICAL EPITHELIAL CELLS  . BREAST EXCISIONAL BIOPSY Left 01/18/1986   Benign microcalcifications, [employer].  .Marland KitchenBREAST SURGERY  1988  . CARDIAC CATHETERIZATION  1989  . CATARACT EXTRACTION  1997 and 1998  . COLONOSCOPY WITH PROPOFOL N/A 09/12/2015   Procedure: COLONOSCOPY WITH PROPOFOL;  Surgeon: RManya Silvas MD;  [facility];  Service: Endoscopy;  Laterality: N/A;  . KIDNEY STONE SURGERY  2018  . pace maker  2008  [place] TONSILLECTOMY AND ADENOIDECTOMY  1950"s   Family History  Problem Relation Age of Onset  . Stroke Mother   . Diabetes Mother   .  Cancer Maternal Aunt        Breast Cancer  . Breast cancer Maternal Aunt   . Arthritis Maternal Grandmother   . Cancer Maternal Aunt        Lung Cancer  . Diabetes Son   . Cancer Son   . Kidney disease Neg Hx   . Bladder Cancer Neg Hx    Social History   Occupational History  . Not on file.   Social History Main Topics  . Smoking status: Former Smoker    Years: 5.00    Quit date: 09/14/1968  . Smokeless tobacco: Never Used     Comment: quit 1970  . Alcohol use No  . Drug use: No  . Sexual activity: Not on file    Tobacco Counseling Counseling given: Not Answered   Activities of Daily  Living In your present state of health, do you have any difficulty performing the following activities: 04/29/2017  Hearing? N  Vision? Y  Comment Macular degeneration  Difficulty concentrating or making decisions? N  Walking or climbing stairs? N  Dressing or bathing? N  Doing errands, shopping? Y  Comment She does not drive.   Preparing Food and eating ? N  Using the Toilet? N  In the past six months, have you accidently leaked urine? Y  Comment Managed with a daily pad  Do you have problems with loss of bowel control? N  Managing your Medications? N  Managing your Finances? N  Housekeeping or managing your Housekeeping? N  Some recent data might be hidden    Immunizations and Health Maintenance Immunization History  Administered Date(s) Administered  . Influenza, High Dose Seasonal PF 06/03/2016  . Influenza, Seasonal, Injecte, Preservative Fre 06/24/2007, 06/16/2011  . Influenza,inj,Quad PF,36+ Mos 06/11/2015  . Pneumococcal Polysaccharide-23 06/24/2007  . Td 09/14/2009  . Zoster 09/04/2013   Health Maintenance Due  Topic Date Due  . OPHTHALMOLOGY EXAM  11/18/1939  . HEMOGLOBIN A1C  02/18/2017  . URINE MICROALBUMIN  03/09/2017  . INFLUENZA VACCINE  04/14/2017    Patient Care Team: [name], MD as PCP - General (Internal Medicine) [name], MD (Internal Medicine) Bary Castilla Forest Gleason, MD (General Surgery)  Indicate any recent Medical Services you may have received from other than [facility] providers in the past year (date may be approximate).     Assessment:   This is a routine wellness examination for Holly Rose. The goal of the wellness visit is to assist the patient how to close the gaps in care and create a preventative care plan for the patient.   The roster of all physicians providing medical care to patient is listed in the Snapshot section of the chart.  Taking calcium VIT D as appropriate/Osteoporosis risk reviewed.    Safety issues reviewed;  Smoke and carbon monoxide detectors in the home. No firearms in the home.  Wears seatbelts when driving or riding with others. Patient does wear sunscreen or protective clothing when in direct sunlight. No violence in the home.  Patient is alert, normal appearance, oriented to person/place/and time. Correctly identified the president of the [place], recall of 2/3 words, and performing simple calculations. Displays appropriate judgement and can read correct time from watch face.   No new identified risk were noted.  No failures at ADL's or IADL's.    BMI- discussed the importance of a healthy diet, water intake and the benefits of aerobic exercise. Educational material provided.   24 hour diet recall: Breakfast: Blueberry english muffin, 2 tablespoons  cottage cheese Lunch: Peanut butter sandwich, 2 celery sticks with pimento Dinner: Roast beef sandwich, 2 onion rings   Daily fluid intake: 1 cups of caffeine, 4 cups of water, 1 V8 juice  Dental- dentures and implants.  Sleep patterns- Sleeps 7-8 hours at night.  Wakes feeling rested.  Diabtetes- followed by Endocrinology, Dr. Gabriel Carina.  Patient Concerns: None at this time. Follow up with PCP as needed.  Hearing/Vision screen Hearing Screening Comments: Patient is able to hear conversational tones without difficulty.  No issues reported.   Vision Screening Comments: Followed by [facility] Wears corrective lenses OV every 4 weeks; injections administered Macular degeneration Cataract extraction, bilateral Visual acuity not assessed per patient preference since they have regular follow up with the ophthalmologist  Dietary issues and exercise activities discussed: Current Exercise Habits: Home exercise routine, Time (Minutes): 20, Frequency (Times/Week): 4, Weekly Exercise (Minutes/Week): 80, Intensity: Mild  Goals    . Increase lean proteins          Low carb foods    . Increase physical activity          Stay active and  walk for exercise      Depression Screen PHQ 2/9 Scores 04/29/2017 08/05/2016 06/03/2016 06/11/2015 02/12/2015  PHQ - 2 Score 0 0 0 0 0    Fall Risk Fall Risk  04/29/2017 08/05/2016 06/03/2016 06/11/2015 02/12/2015  Falls in the past year? No No No No No    Cognitive Function:     6CIT Screen 04/29/2017  What Year? 0 points  What month? 0 points  What time? 0 points  Count back from 20 0 points  Months in reverse 0 points  Repeat phrase 0 points  Total Score 0    Screening Tests Health Maintenance  Topic Date Due  . OPHTHALMOLOGY EXAM  [birth year]18[birth year]1941  . HEMOGLOBIN A1C  02/18/2017  . URINE MICROALBUMIN  03/09/2017  . INFLUENZA VACCINE  04/14/2017  . PNA vac Low Risk Adult (2 of 2 - PCV13) 03/14/2019 (Originally 06/23/2008)  . FOOT EXAM  03/09/2018  . TETANUS/TDAP  09/15/2019  . DEXA SCAN  Completed      Plan:    End of life planning; Advance aging; Advanced directives discussed. Copy of current HCPOA/Living Will requested.    I have personally reviewed and noted the following in the patient's chart:   . Medical and social history . Use of alcohol, tobacco or illicit drugs  . Current medications and supplements . Functional ability and status . Nutritional status . Physical activity . Advanced directives . List of other physicians . Hospitalizations, surgeries, and ER visits in previous 12 months . Vitals . Screenings to include cognitive, depression, and falls . Referrals and appointments  In addition, I have reviewed and discussed with patient certain preventive protocols, quality metrics, and best practice recommendations. A written personalized care plan for preventive services as well as general preventive health recommendations were provided to patient.     Varney Biles, LPN   4/84/[id number]    Reviewed above information.  Agree with assessment and plan.  Dr [name]

## 2017-04-29 NOTE — Patient Instructions (Addendum)
  Holly Rose , Thank you for taking time to come for your Medicare Wellness Visit. I appreciate your ongoing commitment to your health goals. Please review the following plan we discussed and let me know if I can assist you in the future.   Follow up with Dr. [name] as needed.    Bring a copy of your [place] and/or Living Will to be scanned into chart.  Have a great day!  These are the goals we discussed: Goals    . Increase physical activity          Stay active and walk for exercise       This is a list of the screening recommended for you and due dates:  Health Maintenance  Topic Date Due  . Eye exam for diabetics  11/18/1939  . Hemoglobin A1C  02/18/2017  . Urine Protein Check  03/09/2017  . Flu Shot  04/14/2017  . Pneumonia vaccines (2 of 2 - PCV13) 03/14/2019*  . Complete foot exam   03/09/2018  . Tetanus Vaccine  09/15/2019  . DEXA scan (bone density measurement)  Completed  *Topic was postponed. The date shown is not the original due date.

## 2017-05-05 ENCOUNTER — Telehealth: Payer: Self-pay | Admitting: *Deleted

## 2017-05-05 NOTE — Telephone Encounter (Signed)
Patient was contacted today and made aware that we received cardiac clearance from Dr. Bethanne Ginger office.   This patient wishes to call the office back tomorrow morning after she reviews her calendar to arrange a date for surgery.   Patient will also need a pre-op visit with Dr. Bary Castilla and a pre-admission appointment.

## 2017-05-05 NOTE — Telephone Encounter (Signed)
-----   Message from [name], MD sent at 05/05/2017  4:30 PM EDT ----- OK to schedule for left wide excision, NO SLN.  Needs preop appointment. Scheduling sheet completed.  ----- Message ----- From: Dominga Ferry, CMA Sent: 05/05/2017   8:33 AM To: [name], MD  See cardiac clearance from Dr. Ubaldo Glassing in your blue folder. I do not have a scheduling sheet on this patient. Thanks.

## 2017-05-06 NOTE — Telephone Encounter (Signed)
Spoke with Holly Rose, patient's daughter and the patient is scheduled for surgery at [facility] on 05/26/17. She will pre admit at the hospital on 05/18/17. The patient is aware of dates and instructions.

## 2017-05-06 NOTE — Telephone Encounter (Signed)
The patient will be seen here for a pre op appointment with Dr Bary Castilla on 05/10/17 at 3:00 pm.

## 2017-05-10 ENCOUNTER — Inpatient Hospital Stay: Payer: Self-pay

## 2017-05-10 ENCOUNTER — Ambulatory Visit (INDEPENDENT_AMBULATORY_CARE_PROVIDER_SITE_OTHER): Payer: Medicare Other | Admitting: General Surgery

## 2017-05-10 ENCOUNTER — Encounter: Payer: Self-pay | Admitting: General Surgery

## 2017-05-10 VITALS — BP 136/74 | HR 111 | Resp 12 | Ht 66.0 in | Wt 157.0 lb

## 2017-05-10 DIAGNOSIS — Z170 Estrogen receptor positive status [ER+]: Secondary | ICD-10-CM

## 2017-05-10 DIAGNOSIS — C50412 Malignant neoplasm of upper-outer quadrant of left female breast: Secondary | ICD-10-CM | POA: Diagnosis not present

## 2017-05-10 DIAGNOSIS — I2510 Atherosclerotic heart disease of native coronary artery without angina pectoris: Secondary | ICD-10-CM

## 2017-05-10 NOTE — Progress Notes (Signed)
Patient ID: Holly Rose, female   DOB: [birth year], 83 y.o.   MRN: [id number]  Chief Complaint  Patient presents with  . Pre-op Exam    left breast wide excison    HPI Ferrin A Vanderpol is a 83 y.o. female here today for her pre op left breast wide excision scheduled on 05/26/2017. Maloree Uplinger daughter in law is present at visit.                                                      Marland KitchenHPI   Past Medical History:  Diagnosis Date  . Allergy   . Anemia   . Arthritis   . Atrial fibrillation ([place]) 2004  . CAD (coronary artery disease)   . Cancer ([place])    H/O skin cancer, squamous  . Diabetes ([place])   . Heart attack ([place]) 2008  . Heart murmur   . History of sick sinus syndrome    s/p pacemaker placement  . Hyperlipidemia   . Kidney calculi 11/2016  . Macular degeneration   . Neuropathy   . Neuropathy   . Pacemaker 06/2010  . TIA (transient ischemic attack)     Past Surgical History:  Procedure Laterality Date  . ABDOMINAL HYSTERECTOMY  1960's  . APPENDECTOMY  1947  . [place]  . BREAST BIOPSY Left 12/17/2016   SINGLE FRAGMENT OF ATYPICAL EPITHELIAL CELLS  . BREAST EXCISIONAL BIOPSY Left 01/18/1986   Benign microcalcifications, [employer].  Marland Kitchen BREAST SURGERY  1988  . CARDIAC CATHETERIZATION  1989  . CATARACT EXTRACTION  1997 and 1998  . COLONOSCOPY WITH PROPOFOL N/A 09/12/2015   Procedure: COLONOSCOPY WITH PROPOFOL;  Surgeon: [name], MD;  Location: [facility] ENDOSCOPY;  Service: Endoscopy;  Laterality: N/A;  . KIDNEY STONE SURGERY  2018  . pace maker  2008  . [place]  . TONSILLECTOMY AND ADENOIDECTOMY  1950"s    Family History  Problem Relation Age of Onset  . Stroke Mother   . Diabetes Mother   . Cancer Maternal Aunt        Breast Cancer  . Breast cancer Maternal Aunt   . Arthritis Maternal Grandmother   . Cancer Maternal Aunt        Lung Cancer  . Diabetes Son   . Cancer Son   . Kidney disease Neg Hx   .  Bladder Cancer Neg Hx     Social History Social History  Substance Use Topics  . Smoking status: Former Smoker    Years: 5.00    Quit date: 09/14/1968  . Smokeless tobacco: Never Used     Comment: quit 1970  . Alcohol use No    Allergies  Allergen Reactions  . Lyrica [Pregabalin] Other (See Comments) and Swelling    Other reaction(s): SWELLING/EDEMA Other reaction(s): SWELLING/EDEMA Sleepy, does not eat  . Neurontin [Gabapentin] Other (See Comments) and Nausea And Vomiting    Other reaction(s): Other (See Comments) Out of it Other reaction(s): Other (See Comments) Out of it Other reaction(s): OTHER Out of it  . Bactrim [Sulfamethoxazole-Trimethoprim]     Made her dizziness    Current Outpatient Prescriptions  Medication Sig Dispense Refill  . aspirin 81 MG tablet Take 81 mg by mouth daily.    . blood glucose  meter kit and supplies Per patient please dispense One touch Ultra2 meter.Use meter and supplies three times a day to check blood sugar. ICD10: E11.40 1 each 0  . fluorouracil (EFUDEX) 5 % cream Apply topically 2 (two) times daily.    . furosemide (LASIX) 20 MG tablet Take 20 mg by mouth.    Marland Kitchen glucose blood (ONE TOUCH ULTRA TEST) test strip USE ONE STRIP TO CHECK GLUCOSE THREE TIMES DAILY 100 each 2  . Lactobacillus (PROBIOTIC ACIDOPHILUS PO) Take by mouth.    Marland Kitchen MAGNESIUM SULFATE PO Take by mouth.    . metFORMIN (GLUCOPHAGE-XR) 500 MG 24 hr tablet TAKE 2 TABLETS BY MOUTH TWICE DAILY 120 tablet 2  . metoprolol (LOPRESSOR) 50 MG tablet Take 50 mg by mouth 2 (two) times daily.    . Multiple Vitamins-Minerals (PRESERVISION AREDS 2 PO) Take by mouth.    . vitamin B-12 (CYANOCOBALAMIN) 1000 MCG tablet Take 1,000 mcg by mouth daily.     No current facility-administered medications for this visit.     Review of Systems Review of Systems  Blood pressure 136/74, pulse (!) 111, resp. rate 12, height _0  (1.676 m), weight 157 lb (71.2 kg).  Physical Exam Physical Exam   Constitutional: She is oriented to person, place, and time. She appears well-developed and well-nourished.  Eyes: Conjunctivae are normal. No scleral icterus.  Neck: Neck supple.  Cardiovascular: Normal rate, regular rhythm and normal heart sounds.   Pulmonary/Chest: Effort normal and breath sounds normal.  Lymphadenopathy:    She has no cervical adenopathy.  Neurological: She is alert and oriented to person, place, and time.  Skin: Skin is warm and dry.    Data Reviewed 04/16/2017 cardiology evaluation note reviewed. Pacemaker interrogation dated 04/13/2017 estimated battery life at 2.5 years.  Ultrasound examination of the 12:00 position of the left breast shows a 3 lobulated 0.9 x 1.02 x 1.04 cm mass 5 cm from the nipple. No increased blood flow on duplex imaging. Primarily hematoma after the prior 10-gauge Encor biopsy previously completed. BI-RADS-6.  Assessment    Left breast mass with that leak a microscopic foci of invasive mammary cancer.    Plan    With the pacemaker status clarified, we will elected to arrange for excision of this area under local anesthesia with sedation. I don't believe the patient's candidate for sentinel node biopsy. This should help put to rest the stepwise increase in pathology from atypia on the 14-gauge biopsy to a microscopic foci of invasive cancer on a 10-gauge biopsy and allow [place] to initiate antiestrogen therapy. ER and PR were negative on the original core sample, but considering the tiny volume of tissue all have this repeated on the formal excision if additional invasive cancer is seen.    Patient is scheduled for left breast wide excision on  05/26/2017.   HPI, Physical Exam, Assessment and Plan have been scribed under the direction and in the presence of Hervey Ard, MD.  Gaspar Cola, CMA  I have completed the exam and reviewed the above documentation for accuracy and completeness.  I agree with the above.  Haematologist has  been used and any errors in dictation or transcription are unintentional.  Hervey Ard, M.D., F.A.C.S.  [name] 05/11/2017, 9:52 AM

## 2017-05-10 NOTE — Patient Instructions (Signed)
Patient is scheduled for left breast wide excision on  05/26/2017.

## 2017-05-18 ENCOUNTER — Encounter
Admission: RE | Admit: 2017-05-18 | Discharge: 2017-05-18 | Disposition: A | Discharge status: Home / Self Care | Payer: Medicare Other | Source: Ambulatory Visit | Attending: General Surgery | Admitting: General Surgery

## 2017-05-18 DIAGNOSIS — C50412 Malignant neoplasm of upper-outer quadrant of left female breast: Secondary | ICD-10-CM | POA: Diagnosis not present

## 2017-05-18 DIAGNOSIS — Z170 Estrogen receptor positive status [ER+]: Secondary | ICD-10-CM | POA: Diagnosis not present

## 2017-05-18 HISTORY — DX: Cardiac arrhythmia, unspecified: I49.9

## 2017-05-18 HISTORY — DX: Adverse effect of unspecified anesthetic, initial encounter: T41.45XA

## 2017-05-18 HISTORY — DX: Personal history of urinary calculi: Z87.442

## 2017-05-18 HISTORY — DX: Other complications of anesthesia, initial encounter: T88.59XA

## 2017-05-18 NOTE — Pre-Procedure Instructions (Signed)
[place] perioperatove prescription for implanted cardiac device programming form faxed to [facility] for completion and return.

## 2017-05-18 NOTE — Patient Instructions (Signed)
  Your procedure is scheduled on:Wednesday Sept. 12 , 2018. Report to Same Day Surgery. To find out your arrival time please call [phone] between 1PM - 3PM on Tuesday Sept. 11, 2018.  Remember: Instructions that are not followed completely may result in serious medical risk, up to and including death, or upon the discretion of your surgeon and anesthesiologist your surgery may need to be rescheduled.    _x___ 1. Do not eat food or drink liquids after midnight. No gum chewing or hard candies. Clear liquids up to   2 hours prior to arrival time.    ____ 2. No Alcohol for 24 hours before or after surgery.   ____ 3. Bring all medications with you on the day of surgery if instructed.    __x__ 4. Notify your doctor if there is any change in your medical condition     (cold, fever, infections).    _____ 5. No smoking 24 hours prior to surgery.     Do not wear jewelry, make-up, hairpins, clips or nail polish.  Do not wear lotions, powders, or perfumes.   Do not shave 48 hours prior to surgery. Men may shave face and neck.  Do not bring valuables to the hospital.    [facility] is not responsible for any belongings or valuables.               Contacts, dentures or bridgework may not be worn into surgery.  Leave your suitcase in the car. After surgery it may be brought to your room.  For patients admitted to the hospital, discharge time is determined by your treatment team.   Patients discharged the day of surgery will not be allowed to drive home.    Please read over the following fact sheets that you were given:   [facility] Preparing for Surgery  __x__ Take these medicines the morning of surgery with A SIP OF WATER:    1. metoprolol (LOPRESSOR)   ____ Fleet Enema (as directed)   _x___ Use CHG Soap as directed on instruction sheet  ____ Use inhalers on the day of surgery and bring to hospital day of surgery  ____ Stop metformin 2 days prior to surgery, last dose will be on  Sept. 9, 2018.    ____ Take 1/2 of usual insulin dose the night before surgery and none on the morning of surgery.   __x__ Stop aspirin on per Dr. Dwyane Luo instructions.  _x___ Stop Anti-inflammatories such as Advil, Aleve, Ibuprofen, Motrin, Naproxen, Naprosyn, Goodies powders or aspirin  products. OK to take Tylenol.   ____ Stop supplements until after surgery.    ____ Bring C-Pap to the hospital.

## 2017-05-18 NOTE — Pre-Procedure Instructions (Signed)
ECG 12 Lead3/02/2017 [place] Component Name Value Ref Range  EKG Ventricular Rate 65 BPM   EKG Atrial Rate 288 BPM   EKG QRS Duration 96 ms  EKG Q-T Interval 428 ms  EKG QTC Calculation 445 ms  EKG Calculated R Axis 65 degrees   EKG Calculated T Axis 58 degrees   Result Narrative  ATRIAL FIBRILLATION WITH A DEMAND PACEMAKER WHEN COMPARED WITH ECG OF 15-Apr-2007 08:17, RATE HAS DECREASED AND VENTRICULAR-PACED COMPLEXES ARE NOW PRESENT Confirmed by Ishmael Holter MD, Charles [phone]) on 11/17/2016 5:41:23 PM

## 2017-05-19 DIAGNOSIS — E1142 Type 2 diabetes mellitus with diabetic polyneuropathy: Secondary | ICD-10-CM | POA: Diagnosis not present

## 2017-05-19 DIAGNOSIS — Z794 Long term (current) use of insulin: Secondary | ICD-10-CM | POA: Diagnosis not present

## 2017-05-19 DIAGNOSIS — N6320 Unspecified lump in the left breast, unspecified quadrant: Secondary | ICD-10-CM | POA: Diagnosis not present

## 2017-05-20 ENCOUNTER — Other Ambulatory Visit: Payer: BLUE CROSS/BLUE SHIELD

## 2017-05-21 ENCOUNTER — Encounter: Payer: Self-pay | Admitting: General Surgery

## 2017-05-24 DIAGNOSIS — H353221 Exudative age-related macular degeneration, left eye, with active choroidal neovascularization: Secondary | ICD-10-CM | POA: Diagnosis not present

## 2017-05-25 MED ORDER — CEFAZOLIN SODIUM-DEXTROSE 2-4 GM/100ML-% IV SOLN
2.0000 g | INTRAVENOUS | Status: AC
  Administered 2017-05-26: 2 g via INTRAVENOUS

## 2017-05-25 NOTE — Pre-Procedure Instructions (Signed)
Called Dr. Bethanne Ginger office for an update on the Pacemaker form that was faxed 05/18/17.  Spoke with Gwinda Passe, she suggested that I refax the form to their office, she will let Dr. Bethanne Ginger nurse know to expect this form for completion and return to PAT ASAP.

## 2017-05-26 ENCOUNTER — Encounter
Admission: RE | Disposition: A | Discharge status: Home / Self Care | Payer: Self-pay | Source: Ambulatory Visit | Attending: General Surgery

## 2017-05-26 ENCOUNTER — Ambulatory Visit: Payer: Medicare Other | Admitting: Registered Nurse

## 2017-05-26 ENCOUNTER — Ambulatory Visit
Admission: RE | Admit: 2017-05-26 | Discharge: 2017-05-26 | Disposition: A | Discharge status: Home / Self Care | Payer: Medicare Other | Source: Ambulatory Visit | Attending: General Surgery | Admitting: General Surgery

## 2017-05-26 ENCOUNTER — Encounter: Payer: Self-pay | Admitting: Anesthesiology

## 2017-05-26 DIAGNOSIS — N6320 Unspecified lump in the left breast, unspecified quadrant: Secondary | ICD-10-CM | POA: Diagnosis not present

## 2017-05-26 DIAGNOSIS — E119 Type 2 diabetes mellitus without complications: Secondary | ICD-10-CM | POA: Diagnosis not present

## 2017-05-26 DIAGNOSIS — Z7982 Long term (current) use of aspirin: Secondary | ICD-10-CM | POA: Diagnosis not present

## 2017-05-26 DIAGNOSIS — I10 Essential (primary) hypertension: Secondary | ICD-10-CM | POA: Insufficient documentation

## 2017-05-26 DIAGNOSIS — Z79899 Other long term (current) drug therapy: Secondary | ICD-10-CM | POA: Diagnosis not present

## 2017-05-26 DIAGNOSIS — I2510 Atherosclerotic heart disease of native coronary artery without angina pectoris: Secondary | ICD-10-CM | POA: Diagnosis not present

## 2017-05-26 DIAGNOSIS — Z803 Family history of malignant neoplasm of breast: Secondary | ICD-10-CM | POA: Insufficient documentation

## 2017-05-26 DIAGNOSIS — C50412 Malignant neoplasm of upper-outer quadrant of left female breast: Secondary | ICD-10-CM

## 2017-05-26 DIAGNOSIS — Z85828 Personal history of other malignant neoplasm of skin: Secondary | ICD-10-CM | POA: Diagnosis not present

## 2017-05-26 DIAGNOSIS — I252 Old myocardial infarction: Secondary | ICD-10-CM | POA: Insufficient documentation

## 2017-05-26 DIAGNOSIS — Z7984 Long term (current) use of oral hypoglycemic drugs: Secondary | ICD-10-CM | POA: Diagnosis not present

## 2017-05-26 DIAGNOSIS — Z8673 Personal history of transient ischemic attack (TIA), and cerebral infarction without residual deficits: Secondary | ICD-10-CM | POA: Insufficient documentation

## 2017-05-26 DIAGNOSIS — C50912 Malignant neoplasm of unspecified site of left female breast: Secondary | ICD-10-CM | POA: Diagnosis not present

## 2017-05-26 DIAGNOSIS — E1140 Type 2 diabetes mellitus with diabetic neuropathy, unspecified: Secondary | ICD-10-CM | POA: Insufficient documentation

## 2017-05-26 DIAGNOSIS — I4891 Unspecified atrial fibrillation: Secondary | ICD-10-CM | POA: Insufficient documentation

## 2017-05-26 DIAGNOSIS — Z87891 Personal history of nicotine dependence: Secondary | ICD-10-CM | POA: Diagnosis not present

## 2017-05-26 DIAGNOSIS — D649 Anemia, unspecified: Secondary | ICD-10-CM | POA: Diagnosis not present

## 2017-05-26 DIAGNOSIS — Z950 Presence of cardiac pacemaker: Secondary | ICD-10-CM | POA: Diagnosis not present

## 2017-05-26 DIAGNOSIS — N6321 Unspecified lump in the left breast, upper outer quadrant: Secondary | ICD-10-CM | POA: Diagnosis present

## 2017-05-26 DIAGNOSIS — Z170 Estrogen receptor positive status [ER+]: Secondary | ICD-10-CM

## 2017-05-26 HISTORY — PX: BREAST LUMPECTOMY: SHX2

## 2017-05-26 LAB — GLUCOSE, CAPILLARY: Glucose-Capillary: 156 mg/dL — ABNORMAL HIGH (ref 65–99)

## 2017-05-26 SURGERY — BREAST LUMPECTOMY
Anesthesia: General | Laterality: Left | Wound class: Clean

## 2017-05-26 MED ORDER — LIDOCAINE HCL (CARDIAC) 20 MG/ML IV SOLN
INTRAVENOUS | Status: DC | PRN
  Administered 2017-05-26: 40 mg via INTRAVENOUS

## 2017-05-26 MED ORDER — FENTANYL CITRATE (PF) 100 MCG/2ML IJ SOLN: 25.0000 ug | INTRAMUSCULAR | Status: DC | PRN

## 2017-05-26 MED ORDER — LIDOCAINE HCL 1 % IJ SOLN
INTRAMUSCULAR | Status: DC | PRN
  Administered 2017-05-26: 20 mL

## 2017-05-26 MED ORDER — LIDOCAINE HCL (PF) 1 % IJ SOLN
INTRAMUSCULAR | Status: AC
Start: 2017-05-26 — End: ?
  Filled 2017-05-26: qty 30

## 2017-05-26 MED ORDER — PROPOFOL 500 MG/50ML IV EMUL
INTRAVENOUS | Status: DC | PRN
  Administered 2017-05-26: 75 ug/kg/min via INTRAVENOUS

## 2017-05-26 MED ORDER — FENTANYL CITRATE (PF) 100 MCG/2ML IJ SOLN
INTRAMUSCULAR | Status: AC
  Filled 2017-05-26: qty 2

## 2017-05-26 MED ORDER — HYDROCODONE-ACETAMINOPHEN 5-325 MG PO TABS: 1.0000 | ORAL_TABLET | ORAL | 0 refills | Status: DC | PRN

## 2017-05-26 MED ORDER — BUPIVACAINE-EPINEPHRINE (PF) 0.5% -1:200000 IJ SOLN
INTRAMUSCULAR | Status: AC
  Filled 2017-05-26: qty 30

## 2017-05-26 MED ORDER — BUPIVACAINE-EPINEPHRINE (PF) 0.5% -1:200000 IJ SOLN
INTRAMUSCULAR | Status: DC | PRN
  Administered 2017-05-26: 20 mL via PERINEURAL

## 2017-05-26 MED ORDER — ONDANSETRON HCL 4 MG/2ML IJ SOLN
INTRAMUSCULAR | Status: AC
  Filled 2017-05-26: qty 2

## 2017-05-26 MED ORDER — FENTANYL CITRATE (PF) 100 MCG/2ML IJ SOLN
INTRAMUSCULAR | Status: DC | PRN
  Administered 2017-05-26 (×2): 12.5 ug via INTRAVENOUS
  Administered 2017-05-26: 25 ug via INTRAVENOUS

## 2017-05-26 MED ORDER — FAMOTIDINE 20 MG PO TABS
ORAL_TABLET | ORAL | Status: AC
  Administered 2017-05-26: 20 mg via ORAL
  Filled 2017-05-26: qty 1

## 2017-05-26 MED ORDER — ONDANSETRON HCL 4 MG/2ML IJ SOLN
INTRAMUSCULAR | Status: DC | PRN
  Administered 2017-05-26: 4 mg via INTRAVENOUS

## 2017-05-26 MED ORDER — ONDANSETRON HCL 4 MG/2ML IJ SOLN: 4.0000 mg | Freq: Once | INTRAMUSCULAR | Status: DC | PRN

## 2017-05-26 MED ORDER — FAMOTIDINE 20 MG PO TABS
20.0000 mg | ORAL_TABLET | Freq: Once | ORAL | Status: AC
  Administered 2017-05-26: 20 mg via ORAL

## 2017-05-26 MED ORDER — PROPOFOL 10 MG/ML IV BOLUS
INTRAVENOUS | Status: AC
  Filled 2017-05-26: qty 20

## 2017-05-26 MED ORDER — CEFAZOLIN SODIUM-DEXTROSE 2-4 GM/100ML-% IV SOLN
INTRAVENOUS | Status: AC
  Filled 2017-05-26: qty 100

## 2017-05-26 MED ORDER — SODIUM CHLORIDE 0.9 % IV SOLN
INTRAVENOUS | Status: DC
  Administered 2017-05-26: 11:00:00 via INTRAVENOUS

## 2017-05-26 MED ORDER — LIDOCAINE HCL (PF) 2 % IJ SOLN
INTRAMUSCULAR | Status: AC
  Filled 2017-05-26: qty 4

## 2017-05-26 SURGICAL SUPPLY — 49 items
BLADE SURG 15 STRL SS SAFETY (BLADE) ×3 IMPLANT
BRA SURGICAL LRG (MISCELLANEOUS) IMPLANT
BRA SURGICAL MEDIUM (MISCELLANEOUS) ×3 IMPLANT
BRA SURGICAL XLRG (MISCELLANEOUS) IMPLANT
BULB RESERV EVAC DRAIN JP 100C (MISCELLANEOUS) IMPLANT
CANISTER SUCT 1200ML W/VALVE (MISCELLANEOUS) ×3 IMPLANT
CHLORAPREP W/TINT 26ML (MISCELLANEOUS) ×3 IMPLANT
CLOSURE WOUND 1/2 X4 (GAUZE/BANDAGES/DRESSINGS) ×1
CNTNR SPEC 2.5X3XGRAD LEK (MISCELLANEOUS) ×1
CONT SPEC 4OZ STER OR WHT (MISCELLANEOUS) ×2
CONTAINER SPEC 2.5X3XGRAD LEK (MISCELLANEOUS) ×1 IMPLANT
COVER PROBE FLX POLY STRL (MISCELLANEOUS) ×3 IMPLANT
DRAIN CHANNEL JP 15F RND 16 (MISCELLANEOUS) IMPLANT
DRAPE LAPAROTOMY TRNSV 106X77 (MISCELLANEOUS) ×3 IMPLANT
DRSG TELFA 3X8 NADH (GAUZE/BANDAGES/DRESSINGS) ×3 IMPLANT
ELECT CAUTERY BLADE TIP 2.5 (TIP) ×3
ELECT REM PT RETURN 9FT ADLT (ELECTROSURGICAL) ×3
ELECTRODE CAUTERY BLDE TIP 2.5 (TIP) ×1 IMPLANT
ELECTRODE REM PT RTRN 9FT ADLT (ELECTROSURGICAL) ×1 IMPLANT
GAUZE FLUFF 18X24 1PLY STRL (GAUZE/BANDAGES/DRESSINGS) ×3 IMPLANT
GLOVE BIO SURGEON STRL SZ7.5 (GLOVE) ×9 IMPLANT
GLOVE INDICATOR 8.0 STRL GRN (GLOVE) ×9 IMPLANT
GOWN STRL REUS W/ TWL LRG LVL3 (GOWN DISPOSABLE) ×3 IMPLANT
GOWN STRL REUS W/TWL LRG LVL3 (GOWN DISPOSABLE) ×6
KIT RM TURNOVER STRD PROC AR (KITS) ×3 IMPLANT
LABEL OR SOLS (LABEL) ×3 IMPLANT
MARGIN MAP 10MM (MISCELLANEOUS) ×3 IMPLANT
NDL SAFETY 22GX1.5 (NEEDLE) ×3 IMPLANT
NEEDLE HYPO 25X1 1.5 SAFETY (NEEDLE) ×6 IMPLANT
PACK BASIN MINOR ARMC (MISCELLANEOUS) ×3 IMPLANT
SHEARS FOC LG CVD HARMONIC 17C (MISCELLANEOUS) IMPLANT
SHEARS HARMONIC 9CM CVD (BLADE) IMPLANT
STRIP CLOSURE SKIN 1/2X4 (GAUZE/BANDAGES/DRESSINGS) ×2 IMPLANT
SUT ETHILON 3-0 FS-10 30 BLK (SUTURE) ×3
SUT SILK 2 0 (SUTURE)
SUT SILK 2-0 18XBRD TIE 12 (SUTURE) IMPLANT
SUT VIC AB 2-0 CT1 27 (SUTURE) ×2
SUT VIC AB 2-0 CT1 TAPERPNT 27 (SUTURE) ×1 IMPLANT
SUT VIC AB 3-0 SH 27 (SUTURE) ×2
SUT VIC AB 3-0 SH 27X BRD (SUTURE) ×1 IMPLANT
SUT VIC AB 4-0 FS2 27 (SUTURE) ×3 IMPLANT
SUT VICRYL+ 3-0 144IN (SUTURE) ×3 IMPLANT
SUTURE EHLN 3-0 FS-10 30 BLK (SUTURE) ×1 IMPLANT
SWABSTK COMLB BENZOIN TINCTURE (MISCELLANEOUS) ×3 IMPLANT
SYR BULB IRRIG 60ML STRL (SYRINGE) IMPLANT
SYR CONTROL 10ML (SYRINGE) ×3 IMPLANT
SYRINGE 10CC LL (SYRINGE) IMPLANT
TAPE TRANSPORE STRL 2 31045 (GAUZE/BANDAGES/DRESSINGS) IMPLANT
WATER STERILE IRR 1000ML POUR (IV SOLUTION) ×3 IMPLANT

## 2017-05-26 NOTE — Transfer of Care (Signed)
Immediate Anesthesia Transfer of Care Note  Patient: Holly Rose  Procedure(s) Performed: Procedure(s): LEFT BREAST WIDE EXCISION (Left)  Patient Location: PACU  Anesthesia Type:General  Level of Consciousness: awake, alert  and oriented  Airway & Oxygen Therapy: Patient Spontanous Breathing  Post-op Assessment: Report given to RN and Post -op Vital signs reviewed and stable  Post vital signs: Reviewed and stable  Last Vitals:  Vitals:   05/26/17 1033 05/26/17 1147  BP: (!) 172/88 101/65  Pulse: 77 73  Resp: 18 (!) 9  Temp: (!) 35.9 C 36.5 C  SpO2: 99% 95%    Last Pain:  Vitals:   05/26/17 1147  TempSrc: Temporal         Complications: No apparent anesthesia complications

## 2017-05-26 NOTE — Discharge Instructions (Addendum)
appt Sept 19 at 10:30

## 2017-05-26 NOTE — Anesthesia Procedure Notes (Signed)
Procedure Name: MAC Date/Time: 05/26/2017 11:05 AM Performed by: Hedda Slade Pre-anesthesia Checklist: Patient identified, Emergency Drugs available, Suction available and Patient being monitored Oxygen Delivery Method: Simple face mask

## 2017-05-26 NOTE — Op Note (Signed)
Preoperative diagnosis:  Left upper-outer quadrant breast mass,core biopsy showing microscopic foci of adenocarcinoma.  Postoperative diagnosis: Same.  Operative procedure: Wide excision left rest mass.  Operating surgeon: Ollen Bowl, M.D.  Anesthesia: Attended local, 40 mL 0.5% Xylocaine with 0.25% Marcaine with 1-200,000 units of epinephrine.  Estimated blood loss: Less than 5 mL.  Clinical note. This 83 year old woman was noted with a mass in the upper-outer quadrant of the left breast. Core biopsy with a 14-gauge device sd no evidence of malignancy. Clinical suspicion was strong and general repeat biopsy with a 10-gauge device. This showed a single microscopic foci of adenocarcinoma. She developed a hematoma after the second biopsy and she's brought preoperative at this time perform excision of the mass.  Operative note: With the patient under adequate sedation the breast was prepped with ChloraPrep and draped. Ultrasound was used to identify the area in the upper-outer quadrant of the left breast. Field block anesthesia was established. A curvilinear incision from the 12 to o'clock position was made and carried down through skin and subcutaneous and the remaining dissection was completed with electrocautery. A 2.5 x 3 x 3 cm block of tissue extending down to but not including the pectoralis fashion was excised. The specimen was orientated and sent for routine histology. Hemostasis was achieved electrocautery. The deep tissue was approximated with interrupted 2-0 Vicryl figure-of-eight sutures. The superficial layer of the breast parenchyma and adipose tissue was approximated in a similar fashion. He skin was closed with a running 4-0 Vicryl subcuticular suture. Benzoin, Steri-Strips followed by Telfa pad fluffed gauze and a surgical bra was applied.  The patient tolerated the procedure well and was taken recovery in stable condition

## 2017-05-26 NOTE — H&P (Signed)
No change in clinical history or exam. For formal excision left upper outer quadrant area where a microscopic foci of carcinoma was identified on biopsy.

## 2017-05-26 NOTE — Anesthesia Preprocedure Evaluation (Signed)
Anesthesia Evaluation  Patient identified by MRN, date of birth, ID band Patient awake    Reviewed: Allergy & Precautions, NPO status , Patient's Chart, lab work & pertinent test results, reviewed documented beta blocker date and time   History of Anesthesia Complications (+) history of anesthetic complications  Airway Mallampati: II  TM Distance: >3 FB     Dental  (+) Chipped   Pulmonary former smoker,           Cardiovascular hypertension, Pt. on home beta blockers and Pt. on medications + CAD and + Past MI  + dysrhythmias Atrial Fibrillation + pacemaker + Valvular Problems/Murmurs      Neuro/Psych TIA   GI/Hepatic   Endo/Other  diabetes, Type 2  Renal/GU      Musculoskeletal   Abdominal   Peds  Hematology  (+) anemia ,   Anesthesia Other Findings   Reproductive/Obstetrics                             Anesthesia Physical Anesthesia Plan  ASA: III  Anesthesia Plan: General   Post-op Pain Management:    Induction: Intravenous  PONV Risk Score and Plan:   Airway Management Planned: LMA  Additional Equipment:   Intra-op Plan:   Post-operative Plan:   Informed Consent: I have reviewed the patients History and Physical, chart, labs and discussed the procedure including the risks, benefits and alternatives for the proposed anesthesia with the patient or authorized representative who has indicated his/her understanding and acceptance.     Plan Discussed with: CRNA  Anesthesia Plan Comments:         Anesthesia Quick Evaluation

## 2017-05-26 NOTE — Anesthesia Postprocedure Evaluation (Signed)
Anesthesia Post Note  Patient: Milanya A Ortega  Procedure(s) Performed: Procedure(s) (LRB): LEFT BREAST WIDE EXCISION (Left)  Patient location during evaluation: PACU Anesthesia Type: General Level of consciousness: awake and alert Pain management: pain level controlled Vital Signs Assessment: post-procedure vital signs reviewed and stable Respiratory status: spontaneous breathing, nonlabored ventilation, respiratory function stable and patient connected to nasal cannula oxygen Cardiovascular status: blood pressure returned to baseline and stable Postop Assessment: no signs of nausea or vomiting Anesthetic complications: no     Last Vitals:  Vitals:   05/26/17 1320 05/26/17 1340  BP:  140/80  Pulse: 66 66  Resp:    Temp: (!) 36.2 C   SpO2: 99% 99%    Last Pain:  Vitals:   05/26/17 1320  TempSrc: Temporal  PainSc:                  Colton Tassin S

## 2017-05-26 NOTE — Progress Notes (Signed)
Dr. Byrnett into see 

## 2017-05-26 NOTE — Anesthesia Post-op Follow-up Note (Signed)
Anesthesia QCDR form completed.        

## 2017-05-27 ENCOUNTER — Telehealth: Payer: Self-pay | Admitting: General Surgery

## 2017-05-27 NOTE — Telephone Encounter (Signed)
Results of yesterday's biopsy reviewed with the patient's daughter-in-law per patient request. The mass is indeed a 12 mm invasive cancer. Written report pending. ER/PR receptor status going to be repeated based on the small original sample size. Follow-up is scheduled for September 19.

## 2017-06-02 ENCOUNTER — Ambulatory Visit (INDEPENDENT_AMBULATORY_CARE_PROVIDER_SITE_OTHER): Payer: Medicare Other | Admitting: General Surgery

## 2017-06-02 ENCOUNTER — Encounter: Payer: Self-pay | Admitting: General Surgery

## 2017-06-02 VITALS — BP 126/70 | HR 84 | Resp 14 | Ht 62.0 in | Wt 161.0 lb

## 2017-06-02 DIAGNOSIS — Z170 Estrogen receptor positive status [ER+]: Secondary | ICD-10-CM

## 2017-06-02 DIAGNOSIS — C50412 Malignant neoplasm of upper-outer quadrant of left female breast: Secondary | ICD-10-CM

## 2017-06-02 NOTE — Patient Instructions (Addendum)
Return in three weeks.. 

## 2017-06-02 NOTE — Progress Notes (Signed)
Patient ID: Holly Rose, female   DOB: 04/29/1930, 83 y.o.   MRN: [id number]  Chief Complaint  Patient presents with  . Routine Post Op    HPI Holly Rose is a 83 y.o. female here today for her post op left breast mass excision done on 05/26/2017. Patient states she is doing well. Holly Rose daughter in law is present at visit.  HPI  Past Medical History:  Diagnosis Date  . Allergy   . Anemia   . Arthritis   . Atrial fibrillation ([place]) 2004  . CAD (coronary artery disease)   . Cancer ([place])    H/O skin cancer, squamous  . Complication of anesthesia    hard to wake up with general anesthesia  . Diabetes ([place])   . Dysrhythmia    A-fib  . Heart attack ([place]) 2008  . Heart murmur   . History of kidney stones   . History of sick sinus syndrome    s/p pacemaker placement  . Hyperlipidemia   . Macular degeneration   . Neuropathy   . Neuropathy   . Pacemaker 06/2010  . TIA (transient ischemic attack)     Past Surgical History:  Procedure Laterality Date  . ABDOMINAL HYSTERECTOMY  1960's  . APPENDECTOMY  1947  . [place]  . BREAST BIOPSY Left 12/17/2016   SINGLE FRAGMENT OF ATYPICAL EPITHELIAL CELLS  . BREAST EXCISIONAL BIOPSY Left 01/18/1986   Benign microcalcifications, [employer].  Marland Kitchen BREAST LUMPECTOMY Left 05/26/2017   Procedure: LEFT BREAST WIDE EXCISION;  Surgeon: [name], MD;  Location: [facility] ORS;  Service: General;  Laterality: Left;  . BREAST SURGERY  1988  . CARDIAC CATHETERIZATION  1989  . CATARACT EXTRACTION  1997 and 1998  . COLONOSCOPY WITH PROPOFOL N/A 09/12/2015   Procedure: COLONOSCOPY WITH PROPOFOL;  Surgeon: [name], MD;  Location: [facility] ENDOSCOPY;  Service: Endoscopy;  Laterality: N/A;  . KIDNEY STONE SURGERY  2018  . pace maker  2008  . [place]  . TONSILLECTOMY AND ADENOIDECTOMY  1950"s    Family History  Problem Relation Age of Onset  . Stroke Mother   . Diabetes Mother    . Cancer Maternal Aunt        Breast Cancer  . Breast cancer Maternal Aunt   . Arthritis Maternal Grandmother   . Cancer Maternal Aunt        Lung Cancer  . Diabetes Son   . Cancer Son   . Kidney disease Neg Hx   . Bladder Cancer Neg Hx     Social History Social History  Substance Use Topics  . Smoking status: Former Smoker    Years: 5.00    Quit date: 09/14/1968  . Smokeless tobacco: Never Used     Comment: quit 1970  . Alcohol use No    Allergies  Allergen Reactions  . Lyrica [Pregabalin] Swelling and Other (See Comments)    Other reaction(s): SWELLING/EDEMA Sleepy, does not eat  . Neurontin [Gabapentin] Nausea And Vomiting and Other (See Comments)    disoriented  . Bactrim [Sulfamethoxazole-Trimethoprim]     Made her dizziness    Current Outpatient Prescriptions  Medication Sig Dispense Refill  . aspirin 81 MG tablet Take 81 mg by mouth daily.    . Biotin w/ Vitamins C & E (HAIR/SKIN/NAILS PO) Take 1 tablet by mouth daily.    . blood glucose meter kit and supplies Per patient  please dispense One touch Ultra2 meter.Use meter and supplies three times a day to check blood sugar. ICD10: E11.40 1 each 0  . Cholecalciferol (D3-50 PO) Take 1 tablet by mouth daily.    . furosemide (LASIX) 20 MG tablet Take 20 mg by mouth daily.     Marland Kitchen glucose blood (ONE TOUCH ULTRA TEST) test strip USE ONE STRIP TO CHECK GLUCOSE THREE TIMES DAILY 100 each 2  . Lactobacillus (PROBIOTIC ACIDOPHILUS PO) Take 1 tablet by mouth daily.     Marland Kitchen MAGNESIUM SULFATE PO Take 1 tablet by mouth daily.     . metFORMIN (GLUCOPHAGE-XR) 500 MG 24 hr tablet TAKE 2 TABLETS BY MOUTH TWICE DAILY (Patient taking differently: TAKE 1 TABLETS BY MOUTH TWICE DAILY) 120 tablet 2  . metoprolol (LOPRESSOR) 50 MG tablet Take 50 mg by mouth daily.     . Multiple Vitamins-Minerals (PRESERVISION AREDS 2 PO) Take 1 tablet by mouth daily.     Vladimir Faster Glycol-Propyl Glycol (SYSTANE OP) Apply 1 drop to eye 2 (two) times daily.     . vitamin B-12 (CYANOCOBALAMIN) 1000 MCG tablet Take 1,000 mcg by mouth daily.     No current facility-administered medications for this visit.     Review of Systems Review of Systems  Constitutional: Negative.   Respiratory: Negative.   Cardiovascular: Negative.     Blood pressure 126/70, pulse 84, resp. rate 14, height 5' 2"  (1.575 m), weight 161 lb (73 kg).  Physical Exam Physical Exam  Constitutional: She is oriented to person, place, and time. She appears well-developed and well-nourished.  Pulmonary/Chest:    Left breast excision is clean and healing well.   Neurological: She is alert and oriented to person, place, and time.  Skin: Skin is warm and dry.    Data Reviewed DIAGNOSIS:  A. LEFT BREAST; WIDE EXCISION:  - INVASIVE MAMMARY CARCINOMA OF NO SPECIAL TYPE, 13 MM.  - HISTOLOGIC FEATURES CONSISTENT WITH PRIOR BIOPSY SITE.  - THE MARGINS OF EXCISION ARE NEGATIVE, CLOSEST MARGIN IS MEDIAL.  - SEE SUMMARY BELOW.   Surgical Pathology Cancer Case Summary   INVASIVE CARCINOMA OF THE BREAST  Procedure: Wide excision  Specimen Laterality: Left  Histologic Type: Invasive carcinoma of no special type  Histologic Grade ([place] Histologic Score)       Glandular (Acinar)/Tubular Differentiation: 1       Nuclear Pleomorphism: 1       Mitotic Rate: 1       Overall Grade: 1  Tumor Size: 13 mm  Ductal Carcinoma In Situ (DCIS): Present  Margins: Negative, closest margin is medial (distance 2 mm)  Regional Lymph nodes: None submitted or found  Treatment Effect: No known pre-surgical therapy  Lymphovascular Invasion: Negative   ER: 90%, PR 50%. Her 2 neu not overexpressed. Margins clear.   Assessment    T1c, Nx carcinoma left breast.    Plan    Potential role of postoperative radiation therapy reviewed, we'll defer referral to radiation therapy pending upcoming review at the [facility] breast tumor conference.    Patient to return in three  week. The patient is aware to call back for any questions or concerns.  Follow up appointment to be announced.   HPI, Physical Exam, Assessment and Plan have been scribed under the direction and in the presence of Hervey Ard, MD.  Gaspar Cola, CMA  I have completed the exam and reviewed the above documentation for accuracy and completeness.  I agree with the above.  Haematologist  has been used and any errors in dictation or transcription are unintentional.  Hervey Ard, M.D., F.A.C.S.  [name] 06/03/2017, 2:48 PM

## 2017-06-03 ENCOUNTER — Other Ambulatory Visit: Payer: Self-pay | Admitting: *Deleted

## 2017-06-08 ENCOUNTER — Telehealth: Payer: Self-pay | Admitting: *Deleted

## 2017-06-08 ENCOUNTER — Encounter: Payer: Self-pay | Admitting: General Surgery

## 2017-06-08 DIAGNOSIS — H353211 Exudative age-related macular degeneration, right eye, with active choroidal neovascularization: Secondary | ICD-10-CM | POA: Diagnosis not present

## 2017-06-08 DIAGNOSIS — H353221 Exudative age-related macular degeneration, left eye, with active choroidal neovascularization: Secondary | ICD-10-CM | POA: Diagnosis not present

## 2017-06-08 LAB — SURGICAL PATHOLOGY

## 2017-06-08 NOTE — Telephone Encounter (Signed)
Patient called the office wanted to speak with you, please return call. Thank you

## 2017-06-08 NOTE — Telephone Encounter (Signed)
Notified patient as instructed, patient pleased. Discussed follow-up appointments, October, patient agrees

## 2017-06-08 NOTE — Telephone Encounter (Signed)
Mammoprint order faxed with pathology.

## 2017-06-08 NOTE — Telephone Encounter (Signed)
-----   Message from [name], MD sent at 06/08/2017  9:29 AM EDT ----- Please let the patient/ daughter in law know I am going to get a Mammoprint test and will discuss results on f/u visit. Radiation on hold at this time.  ----- Message ----- From: Interface, Lab In Three Zero One Sent: 05/28/2017  11:35 AM To: [name], MD

## 2017-06-10 DIAGNOSIS — C50412 Malignant neoplasm of upper-outer quadrant of left female breast: Secondary | ICD-10-CM | POA: Diagnosis not present

## 2017-06-14 DIAGNOSIS — N3020 Other chronic cystitis without hematuria: Secondary | ICD-10-CM | POA: Diagnosis not present

## 2017-06-14 DIAGNOSIS — R82994 Hypercalciuria: Secondary | ICD-10-CM | POA: Diagnosis not present

## 2017-06-14 DIAGNOSIS — N3941 Urge incontinence: Secondary | ICD-10-CM | POA: Diagnosis not present

## 2017-06-14 DIAGNOSIS — I878 Other specified disorders of veins: Secondary | ICD-10-CM | POA: Diagnosis not present

## 2017-06-14 DIAGNOSIS — N200 Calculus of kidney: Secondary | ICD-10-CM | POA: Diagnosis not present

## 2017-06-16 DIAGNOSIS — E559 Vitamin D deficiency, unspecified: Secondary | ICD-10-CM | POA: Insufficient documentation

## 2017-06-16 DIAGNOSIS — E785 Hyperlipidemia, unspecified: Secondary | ICD-10-CM | POA: Diagnosis not present

## 2017-06-16 DIAGNOSIS — E1142 Type 2 diabetes mellitus with diabetic polyneuropathy: Secondary | ICD-10-CM | POA: Diagnosis not present

## 2017-06-16 DIAGNOSIS — E1169 Type 2 diabetes mellitus with other specified complication: Secondary | ICD-10-CM | POA: Diagnosis not present

## 2017-06-17 ENCOUNTER — Encounter: Payer: Self-pay | Admitting: General Surgery

## 2017-06-17 ENCOUNTER — Telehealth: Payer: Self-pay | Admitting: General Surgery

## 2017-06-17 NOTE — Telephone Encounter (Signed)
Reviewed results of the Mammoprint study with the patient's daughter-in-law, Ms. Ridolfi. We both agree to this would be better to discuss with her in person at the time of her upcoming appointment scheduled for October 11.

## 2017-06-24 ENCOUNTER — Encounter: Payer: Self-pay | Admitting: General Surgery

## 2017-06-24 ENCOUNTER — Ambulatory Visit (INDEPENDENT_AMBULATORY_CARE_PROVIDER_SITE_OTHER): Payer: Medicare Other | Admitting: General Surgery

## 2017-06-24 VITALS — BP 140/70 | HR 84 | Resp 14 | Ht 62.0 in | Wt 162.0 lb

## 2017-06-24 DIAGNOSIS — C50412 Malignant neoplasm of upper-outer quadrant of left female breast: Secondary | ICD-10-CM

## 2017-06-24 DIAGNOSIS — Z170 Estrogen receptor positive status [ER+]: Secondary | ICD-10-CM

## 2017-06-24 NOTE — Progress Notes (Signed)
Appintment with mnedical Patient ID: Holly Rose, female   DOB: Jan 05, 1930, 83 y.o.   MRN: [id number]  Chief Complaint  Patient presents with  . Follow-up    HPI Holly Rose is a 83 y.o. female here today for her follow up left breast wide excision done on 05/26/2017. Patient states she is doing well. Soreness at the surgical site is steadily decreasing. She is here with her daughter in law, Holly Rose.  HPI  Past Medical History:  Diagnosis Date  . Allergy   . Anemia   . Arthritis   . Atrial fibrillation ([place]) 2004  . CAD (coronary artery disease)   . Cancer ([place])    H/O skin cancer, squamous  . Complication of anesthesia    hard to wake up with general anesthesia  . Diabetes ([place])   . Dysrhythmia    A-fib  . Heart attack ([place]) 2008  . Heart murmur   . History of kidney stones   . History of sick sinus syndrome    s/p pacemaker placement  . Hyperlipidemia   . Macular degeneration   . Neuropathy   . Neuropathy   . Pacemaker 06/2010  . TIA (transient ischemic attack)     Past Surgical History:  Procedure Laterality Date  . ABDOMINAL HYSTERECTOMY  1960's  . APPENDECTOMY  1947  . [place]  . BREAST BIOPSY Left 12/17/2016   SINGLE FRAGMENT OF ATYPICAL EPITHELIAL CELLS  . BREAST EXCISIONAL BIOPSY Left 01/18/1986   Benign microcalcifications, [employer].  Marland Kitchen BREAST LUMPECTOMY Left 05/26/2017   T1c, N0; ER/ PR+, Her 2 neu negative, HIGH RISK by Mammoprint ;  Surgeon: [name], MD;  Location: [facility] ORS;  Service: General;  Laterality: Left;  . BREAST SURGERY  1988  . CARDIAC CATHETERIZATION  1989  . CATARACT EXTRACTION  1997 and 1998  . COLONOSCOPY WITH PROPOFOL N/A 09/12/2015   Procedure: COLONOSCOPY WITH PROPOFOL;  Surgeon: [name], MD;  Location: [facility] ENDOSCOPY;  Service: Endoscopy;  Laterality: N/A;  . KIDNEY STONE SURGERY  2018  . pace maker  2008  . [place]  . TONSILLECTOMY AND  ADENOIDECTOMY  1950"s    Family History  Problem Relation Age of Onset  . Stroke Mother   . Diabetes Mother   . Cancer Maternal Aunt        Breast Cancer  . Breast cancer Maternal Aunt   . Arthritis Maternal Grandmother   . Cancer Maternal Aunt        Lung Cancer  . Diabetes Son   . Cancer Son   . Kidney disease Neg Hx   . Bladder Cancer Neg Hx     Social History Social History  Substance Use Topics  . Smoking status: Former Smoker    Years: 5.00    Quit date: 09/14/1968  . Smokeless tobacco: Never Used     Comment: quit 1970  . Alcohol use No    Allergies  Allergen Reactions  . Lyrica [Pregabalin] Swelling and Other (See Comments)    Other reaction(s): SWELLING/EDEMA Sleepy, does not eat  . Neurontin [Gabapentin] Nausea And Vomiting and Other (See Comments)    disoriented  . Bactrim [Sulfamethoxazole-Trimethoprim]     Made her dizziness    Current Outpatient Prescriptions  Medication Sig Dispense Refill  . aspirin 81 MG tablet Take 81 mg by mouth daily.    . Biotin w/ Vitamins C & E (HAIR/SKIN/NAILS PO)  Take 1 tablet by mouth daily.    . blood glucose meter kit and supplies Per patient please dispense One touch Ultra2 meter.Use meter and supplies three times a day to check blood sugar. ICD10: E11.40 1 each 0  . Cholecalciferol (D3-50 PO) Take 1 tablet by mouth daily.    . furosemide (LASIX) 20 MG tablet Take 20 mg by mouth daily.     Marland Kitchen glucose blood (ONE TOUCH ULTRA TEST) test strip USE ONE STRIP TO CHECK GLUCOSE THREE TIMES DAILY 100 each 2  . Lactobacillus (PROBIOTIC ACIDOPHILUS PO) Take 1 tablet by mouth daily.     Marland Kitchen MAGNESIUM SULFATE PO Take 1 tablet by mouth daily.     . metFORMIN (GLUCOPHAGE-XR) 500 MG 24 hr tablet TAKE 2 TABLETS BY MOUTH TWICE DAILY (Patient taking differently: TAKE 1 TABLETS BY MOUTH TWICE DAILY) 120 tablet 2  . metoprolol (LOPRESSOR) 50 MG tablet Take 50 mg by mouth daily.     . Multiple Vitamins-Minerals (PRESERVISION AREDS 2 PO) Take 1  tablet by mouth daily.     Vladimir Faster Glycol-Propyl Glycol (SYSTANE OP) Apply 1 drop to eye 2 (two) times daily.    . vitamin B-12 (CYANOCOBALAMIN) 1000 MCG tablet Take 1,000 mcg by mouth daily.     No current facility-administered medications for this visit.     Review of Systems Review of Systems  Constitutional: Negative.   Respiratory: Negative.   Cardiovascular: Negative.     Blood pressure 140/70, pulse 84, resp. rate 14, height _0  (1.575 m), weight 162 lb (73.5 kg).  Physical Exam Physical Exam  Constitutional: She is oriented to person, place, and time. She appears well-developed and well-nourished.  Pulmonary/Chest:    Neurological: She is alert and oriented to person, place, and time.  Skin: Skin is warm and dry.  Psychiatric: Her behavior is normal.    Data Reviewed Mammoprint testing showed her to have an approximately 30% risk of local regional recurrence at 10 years without adjuvant chemotherapy. 5% with adjuvant chemotherapy and hormone suppression.  Assessment    Doing well status post wide excision.    Plan    I had originally obtained a Mammoprint with the idea this might allow [place] to avoid radiation therapy if she is found to be low risk. With the identification of high-risk tumor characteristics on molecular analysis, I think she would benefit from medical oncology assessment. This does not commit her to adjuvant chemotherapy, but it would be best for here the pros and cons from those would be administered. If she declined chemotherapy she would certainly be a candidate for antiestrogen therapy and perhaps whole breast radiation with inclusion of the axilla as sentinel node biopsy was not undertaken.    Appointment with medical oncology HPI, Physical Exam, Assessment and Plan have been scribed under the direction and in the presence of Hervey Ard, MD.  Gaspar Cola, CMA  I have completed the exam and reviewed the above documentation for  accuracy and completeness.  I agree with the above.  Haematologist has been used and any errors in dictation or transcription are unintentional.  Hervey Ard, M.D., F.A.C.S.   [name] 06/24/2017, 7:56 PM  The patient has been scheduled for an appointment with Dr. Grayland Ormond at the [facility] for 06-28-17 at 3:15 pm (arrive 3 pm). She is aware of date, time, and instructions.  Dominga Ferry, CMA

## 2017-06-24 NOTE — Patient Instructions (Signed)
Appointment with medical oncology

## 2017-06-27 NOTE — Progress Notes (Signed)
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  INTERVAL HISTORY: patient is an 83 year old female who is noted to have an abnormality in routine screening mammogram. Subsequent biopsy and lumpectomy revealed the above stated breast cancer. She is also noted to have a high risk Mammaprint it is referred for further evaluation and treatment planning. She currently feels well and is asymptomatic. She has no neurologic complaints. She denies any recent fevers or illnesses. She has good appetite and denies weight loss. She has no chest pain or shortness of breath. She denies any nausea, vomiting, constipation, or diarrhea. She has no urinary complaints. Patient feels at her baseline and offers no specific complaints today.  REVIEW OF SYSTEMS:   Review of Systems  Constitutional: Negative.  Negative for fever, malaise/fatigue and weight loss.  Respiratory: Negative.  Negative for cough and shortness of breath.   Cardiovascular: Negative.  Negative for chest pain and leg swelling.  Gastrointestinal: Negative.  Negative for abdominal pain, blood in stool and melena.  Genitourinary: Negative.   Musculoskeletal: Negative.   Skin: Negative.  Negative for rash.  Neurological: Negative.  Negative for weakness.  Psychiatric/Behavioral: Negative.  The patient is not nervous/anxious.     As per HPI. Otherwise, a complete review of systems is negative.  PAST MEDICAL HISTORY: Past Medical History:  Diagnosis Date  . Allergy   . Anemia   . Arthritis   . Atrial fibrillation ([place]) 2004  . CAD (coronary artery  disease)   . Cancer ([place])    H/O skin cancer, squamous  . Complication of anesthesia    hard to wake up with general anesthesia  . Diabetes ([place])   . Dysrhythmia    A-fib  . Heart attack ([place]) 2008  . Heart murmur   . History of kidney stones   . History of sick sinus syndrome    s/p pacemaker placement  . Hyperlipidemia   . Macular degeneration   . Neuropathy   . Neuropathy   . Pacemaker 06/2010  . TIA (transient ischemic attack)     PAST SURGICAL HISTORY: Past Surgical History:  Procedure Laterality Date  . ABDOMINAL HYSTERECTOMY  1960's  . APPENDECTOMY  1947  . [place]  . BREAST BIOPSY Left 12/17/2016   SINGLE FRAGMENT OF ATYPICAL EPITHELIAL CELLS  . BREAST EXCISIONAL BIOPSY Left 01/18/1986   Benign microcalcifications, [employer].  Marland Kitchen BREAST LUMPECTOMY Left 05/26/2017   T1c, N0; ER/ PR+, Her 2 neu negative, HIGH RISK by Mammoprint ;  Surgeon: [name], MD;  Location: [facility] ORS;  Service: General;  Laterality: Left;  . BREAST SURGERY  1988  . CARDIAC CATHETERIZATION  1989  . CATARACT EXTRACTION  1997 and 1998  . COLONOSCOPY WITH PROPOFOL N/A 09/12/2015   Procedure: COLONOSCOPY WITH PROPOFOL;  Surgeon: [name], MD;  Location: [facility] ENDOSCOPY;  Service: Endoscopy;  Laterality: N/A;  . KIDNEY STONE SURGERY  2018  . pace maker  2008  . [place]  . TONSILLECTOMY AND ADENOIDECTOMY  1950"s    FAMILY HISTORY: Family History  Problem Relation Age of Onset  . Stroke  Mother   . Diabetes Mother   . Cancer Maternal Aunt        Breast Cancer  . Breast cancer Maternal Aunt   . Arthritis Maternal Grandmother   . Cancer Maternal Aunt        Lung Cancer  . Diabetes Son   . Cancer Son   . Kidney disease Neg Hx   . Bladder Cancer Neg Hx     ADVANCED DIRECTIVES (Y/N):  N  HEALTH MAINTENANCE: Social History  Substance Use Topics  . Smoking status: Former Smoker    Years: 5.00    Quit date: 09/14/1968  .  Smokeless tobacco: Never Used     Comment: quit 1970  . Alcohol use No     Colonoscopy:  PAP:  Bone density:  Lipid panel:  Allergies  Allergen Reactions  . Lyrica [Pregabalin] Swelling and Other (See Comments)    Other reaction(s): SWELLING/EDEMA Sleepy, does not eat  . Neurontin [Gabapentin] Nausea And Vomiting and Other (See Comments)    disoriented  . Bactrim [Sulfamethoxazole-Trimethoprim]     Made her dizziness    Current Outpatient Prescriptions  Medication Sig Dispense Refill  . aspirin 81 MG tablet Take 81 mg by mouth daily.    . Biotin w/ Vitamins C & E (HAIR/SKIN/NAILS PO) Take 1 tablet by mouth daily.    . blood glucose meter kit and supplies Per patient please dispense One touch Ultra2 meter.Use meter and supplies three times a day to check blood sugar. ICD10: E11.40 1 each 0  . Cholecalciferol (D3-50 PO) Take 1 tablet by mouth daily.    . furosemide (LASIX) 20 MG tablet Take 20 mg by mouth daily.     Marland Kitchen glucose blood (ONE TOUCH ULTRA TEST) test strip USE ONE STRIP TO CHECK GLUCOSE THREE TIMES DAILY 100 each 2  . Lactobacillus (PROBIOTIC ACIDOPHILUS PO) Take 1 tablet by mouth daily.     Marland Kitchen MAGNESIUM SULFATE PO Take 1 tablet by mouth daily.     . metFORMIN (GLUCOPHAGE-XR) 500 MG 24 hr tablet TAKE 2 TABLETS BY MOUTH TWICE DAILY (Patient taking differently: TAKE 1 TABLETS BY MOUTH TWICE DAILY) 120 tablet 2  . metoprolol (LOPRESSOR) 50 MG tablet Take 50 mg by mouth daily.     . Multiple Vitamins-Minerals (PRESERVISION AREDS 2 PO) Take 1 tablet by mouth daily.     Vladimir Faster Glycol-Propyl Glycol (SYSTANE OP) Apply 1 drop to eye 2 (two) times daily.    . vitamin B-12 (CYANOCOBALAMIN) 1000 MCG tablet Take 1,000 mcg by mouth daily.     No current facility-administered medications for this visit.     OBJECTIVE: Vitals:   06/28/17 1538  BP: (!) 147/82  Pulse: 85     Body mass index is 29.65 kg/m.    ECOG FS:0 - Asymptomatic  General: Well-developed, well-nourished,  no acute distress. Eyes: Pink conjunctiva, anicteric sclera. HEENT: Normocephalic, moist mucous membranes, clear oropharnyx. Breasts: Left breast with well-healed surgical scar. Chest wall: Pacemaker in place just above surgical scar. Lungs: Clear to auscultation bilaterally. Heart: Regular rate and rhythm. No rubs, murmurs, or gallops. Abdomen: Soft, nontender, nondistended. No organomegaly noted, normoactive bowel sounds. Musculoskeletal: No edema, cyanosis, or clubbing. Neuro: Alert, answering all questions appropriately. Cranial nerves grossly intact. Skin: No rashes or petechiae noted. Psych: Normal affect. Lymphatics: No cervical, calvicular, axillary or inguinal LAD.   LAB RESULTS:  Lab Results  Component Value Date   NA 135 08/20/2016   K 4.3 08/20/2016   CL  97 08/20/2016   CO2 22 08/20/2016   GLUCOSE 244 (H) 08/20/2016   BUN 10 08/20/2016   CREATININE 0.63 08/20/2016   CALCIUM 10.5 (H) 08/20/2016   PROT 7.6 08/20/2016   ALBUMIN 3.8 08/20/2016   AST 20 08/20/2016   ALT 21 08/20/2016   ALKPHOS 68 08/20/2016   BILITOT 0.5 08/20/2016   GFRNONAA 83 08/20/2016   GFRAA 95 08/20/2016    Lab Results  Component Value Date   WBC 5.7 08/20/2016   NEUTROABS 3.4 08/20/2016   HGB 13.0 08/20/2016   HCT 41.2 08/20/2016   MCV 88 08/20/2016   PLT 180 08/20/2016     STUDIES: No results found.  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  PLAN:    1.  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 After lengthy discussion with the patient, given her advanced age is agreed upon that chemotherapy may be more detrimental than any benefit it would offer. She has agreed to a referral to radiation oncology for evaluation for adjuvant XRT.  If she pursues adjuvant XRT, patient will return to clinic in 8 weeks to initiate aromatase inhibitor. If she declines adjuvant XRT, she will return to clinic in 2 weeks for further evaluation. A baseline bone marrow density has been ordered.  Approximately 60 minutes was spent in discussion of which greater than 50% was consultation.  Patient expressed understanding and was in agreement with this plan. She also understands that She can call clinic at any time with any questions, concerns, or complaints.   Cancer Staging Malignant neoplasm of upper-outer quadrant of left breast in female, estrogen receptor positive ([place]) Staging form: Breast, AJCC 8th Edition - Pathologic stage from 06/27/2017: Stage IA (pT1c, pN0, cM0, G1, ER: Positive, PR: Positive, HER2: Negative) - Signed by [name], MD on 06/27/2017   [name], MD   06/29/2017 10:19 AM

## 2017-06-28 ENCOUNTER — Encounter: Payer: Self-pay | Admitting: Oncology

## 2017-06-28 ENCOUNTER — Encounter: Payer: Self-pay | Admitting: *Deleted

## 2017-06-28 ENCOUNTER — Inpatient Hospital Stay: Payer: Medicare Other | Attending: Oncology | Admitting: Oncology

## 2017-06-28 VITALS — BP 147/82 | HR 85 | Wt 162.1 lb

## 2017-06-28 DIAGNOSIS — I4891 Unspecified atrial fibrillation: Secondary | ICD-10-CM | POA: Insufficient documentation

## 2017-06-28 DIAGNOSIS — M129 Arthropathy, unspecified: Secondary | ICD-10-CM | POA: Diagnosis not present

## 2017-06-28 DIAGNOSIS — Z803 Family history of malignant neoplasm of breast: Secondary | ICD-10-CM | POA: Insufficient documentation

## 2017-06-28 DIAGNOSIS — C50412 Malignant neoplasm of upper-outer quadrant of left female breast: Secondary | ICD-10-CM | POA: Diagnosis not present

## 2017-06-28 DIAGNOSIS — Z87442 Personal history of urinary calculi: Secondary | ICD-10-CM | POA: Diagnosis not present

## 2017-06-28 DIAGNOSIS — E785 Hyperlipidemia, unspecified: Secondary | ICD-10-CM | POA: Insufficient documentation

## 2017-06-28 DIAGNOSIS — Z780 Asymptomatic menopausal state: Secondary | ICD-10-CM

## 2017-06-28 DIAGNOSIS — Z8673 Personal history of transient ischemic attack (TIA), and cerebral infarction without residual deficits: Secondary | ICD-10-CM

## 2017-06-28 DIAGNOSIS — G629 Polyneuropathy, unspecified: Secondary | ICD-10-CM | POA: Diagnosis not present

## 2017-06-28 DIAGNOSIS — Z7982 Long term (current) use of aspirin: Secondary | ICD-10-CM | POA: Diagnosis not present

## 2017-06-28 DIAGNOSIS — Z85828 Personal history of other malignant neoplasm of skin: Secondary | ICD-10-CM | POA: Insufficient documentation

## 2017-06-28 DIAGNOSIS — Z801 Family history of malignant neoplasm of trachea, bronchus and lung: Secondary | ICD-10-CM | POA: Insufficient documentation

## 2017-06-28 DIAGNOSIS — D649 Anemia, unspecified: Secondary | ICD-10-CM | POA: Insufficient documentation

## 2017-06-28 DIAGNOSIS — I252 Old myocardial infarction: Secondary | ICD-10-CM | POA: Insufficient documentation

## 2017-06-28 DIAGNOSIS — Z79899 Other long term (current) drug therapy: Secondary | ICD-10-CM | POA: Diagnosis not present

## 2017-06-28 DIAGNOSIS — Z170 Estrogen receptor positive status [ER+]: Secondary | ICD-10-CM

## 2017-06-28 DIAGNOSIS — Z7984 Long term (current) use of oral hypoglycemic drugs: Secondary | ICD-10-CM | POA: Diagnosis not present

## 2017-06-28 DIAGNOSIS — E119 Type 2 diabetes mellitus without complications: Secondary | ICD-10-CM | POA: Insufficient documentation

## 2017-06-28 DIAGNOSIS — I2510 Atherosclerotic heart disease of native coronary artery without angina pectoris: Secondary | ICD-10-CM | POA: Insufficient documentation

## 2017-06-28 DIAGNOSIS — Z87891 Personal history of nicotine dependence: Secondary | ICD-10-CM | POA: Diagnosis not present

## 2017-06-28 NOTE — Progress Notes (Signed)
  Oncology Nurse Navigator Documentation  Navigator Location: [facility]-Med Onc (06/28/17 1600) Referral date to RadOnc/MedOnc: 06/28/17 (06/28/17 1600) )Navigator Encounter Type: Initial MedOnc (06/28/17 1600)                                                    Time Spent with Patient: 60 (06/28/17 1600)   Met patient, her husband, and daughter-in-law Holly Rose, today during her initial medical oncology visit with Dr. Grayland Rose.  Patient has had lumpectomy, and recent high risk mammoprint results.  Dr. Grayland Rose is referring patient to Dr. Baruch Rose to consult for radiation therapy and treatment with Letrozole.  Patient is to follow-up with him after radiation therapy unless she decides not to proceed with radiation.  She is to call with questions or needs.

## 2017-06-30 ENCOUNTER — Ambulatory Visit
Admission: RE | Admit: 2017-06-30 | Discharge: 2017-06-30 | Disposition: A | Discharge status: Home / Self Care | Payer: Medicare Other | Source: Ambulatory Visit | Attending: Oncology | Admitting: Oncology

## 2017-06-30 DIAGNOSIS — Z780 Asymptomatic menopausal state: Secondary | ICD-10-CM | POA: Insufficient documentation

## 2017-06-30 DIAGNOSIS — M8588 Other specified disorders of bone density and structure, other site: Secondary | ICD-10-CM | POA: Diagnosis not present

## 2017-06-30 DIAGNOSIS — M818 Other osteoporosis without current pathological fracture: Secondary | ICD-10-CM | POA: Diagnosis not present

## 2017-06-30 DIAGNOSIS — M810 Age-related osteoporosis without current pathological fracture: Secondary | ICD-10-CM | POA: Diagnosis not present

## 2017-07-02 ENCOUNTER — Ambulatory Visit: Payer: Medicare Other | Admitting: Radiation Oncology

## 2017-07-05 ENCOUNTER — Ambulatory Visit
Admission: RE | Admit: 2017-07-05 | Discharge: 2017-07-05 | Disposition: A | Discharge status: Home / Self Care | Payer: Medicare Other | Source: Ambulatory Visit | Attending: Radiation Oncology | Admitting: Radiation Oncology

## 2017-07-05 ENCOUNTER — Encounter: Payer: Self-pay | Admitting: Radiation Oncology

## 2017-07-05 ENCOUNTER — Other Ambulatory Visit: Payer: Self-pay | Admitting: *Deleted

## 2017-07-05 VITALS — BP 165/93 | HR 84 | Temp 96.6°F | Resp 18 | Wt 162.7 lb

## 2017-07-05 DIAGNOSIS — Z803 Family history of malignant neoplasm of breast: Secondary | ICD-10-CM | POA: Insufficient documentation

## 2017-07-05 DIAGNOSIS — Z8673 Personal history of transient ischemic attack (TIA), and cerebral infarction without residual deficits: Secondary | ICD-10-CM | POA: Insufficient documentation

## 2017-07-05 DIAGNOSIS — Z950 Presence of cardiac pacemaker: Secondary | ICD-10-CM | POA: Insufficient documentation

## 2017-07-05 DIAGNOSIS — C50412 Malignant neoplasm of upper-outer quadrant of left female breast: Secondary | ICD-10-CM

## 2017-07-05 DIAGNOSIS — Z510 Encounter for antineoplastic radiation therapy: Secondary | ICD-10-CM | POA: Insufficient documentation

## 2017-07-05 DIAGNOSIS — E785 Hyperlipidemia, unspecified: Secondary | ICD-10-CM | POA: Insufficient documentation

## 2017-07-05 DIAGNOSIS — E119 Type 2 diabetes mellitus without complications: Secondary | ICD-10-CM | POA: Insufficient documentation

## 2017-07-05 DIAGNOSIS — I252 Old myocardial infarction: Secondary | ICD-10-CM | POA: Insufficient documentation

## 2017-07-05 DIAGNOSIS — Z79899 Other long term (current) drug therapy: Secondary | ICD-10-CM | POA: Insufficient documentation

## 2017-07-05 DIAGNOSIS — Z87442 Personal history of urinary calculi: Secondary | ICD-10-CM | POA: Insufficient documentation

## 2017-07-05 DIAGNOSIS — I2510 Atherosclerotic heart disease of native coronary artery without angina pectoris: Secondary | ICD-10-CM | POA: Insufficient documentation

## 2017-07-05 DIAGNOSIS — Z170 Estrogen receptor positive status [ER+]: Secondary | ICD-10-CM | POA: Diagnosis not present

## 2017-07-05 DIAGNOSIS — D649 Anemia, unspecified: Secondary | ICD-10-CM | POA: Insufficient documentation

## 2017-07-05 DIAGNOSIS — G629 Polyneuropathy, unspecified: Secondary | ICD-10-CM | POA: Insufficient documentation

## 2017-07-05 DIAGNOSIS — Z7982 Long term (current) use of aspirin: Secondary | ICD-10-CM | POA: Insufficient documentation

## 2017-07-05 DIAGNOSIS — I4891 Unspecified atrial fibrillation: Secondary | ICD-10-CM | POA: Insufficient documentation

## 2017-07-05 DIAGNOSIS — Z9049 Acquired absence of other specified parts of digestive tract: Secondary | ICD-10-CM | POA: Insufficient documentation

## 2017-07-05 DIAGNOSIS — M1990 Unspecified osteoarthritis, unspecified site: Secondary | ICD-10-CM | POA: Insufficient documentation

## 2017-07-05 DIAGNOSIS — Z87891 Personal history of nicotine dependence: Secondary | ICD-10-CM | POA: Insufficient documentation

## 2017-07-05 DIAGNOSIS — R011 Cardiac murmur, unspecified: Secondary | ICD-10-CM | POA: Insufficient documentation

## 2017-07-05 DIAGNOSIS — Z90710 Acquired absence of both cervix and uterus: Secondary | ICD-10-CM | POA: Insufficient documentation

## 2017-07-05 DIAGNOSIS — Z794 Long term (current) use of insulin: Secondary | ICD-10-CM | POA: Insufficient documentation

## 2017-07-05 NOTE — Consult Note (Signed)
NEW PATIENT EVALUATION  Name: Holly Rose  MRN: [id number]  Date:   07/05/2017     DOB: August 17, 1930   This 83 y.o. 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 No sentinel lymph node submitted patient has MammaPrint showing high risk for recurrence.  REFERRING PHYSICIAN: [name], MD  CHIEF COMPLAINT:  Chief Complaint  Patient presents with  . Breast Cancer    Pt is here for initial consult of breast cancer.      DIAGNOSIS: The encounter diagnosis was Malignant neoplasm of upper-outer quadrant of left breast in female, estrogen receptor positive ([place]).   PREVIOUS INVESTIGATIONS:  Mammogram and ultrasound reviewed Pathology reports reviewed Clinical notes reviewed  HPI: Patient is a 83 year old female whose husband was a prior patient of mine for bladder cancer. She was seen for a routine screening mammogram back in March 2018 shows is a suspicious cystic and solid mass at 12:00 position of the left breast. There was also mild prominent left axillary lymph node with thickened cortex. Biopsy of the cystic lesion demonstrated invasive mammary carcinoma do not see any real report on the lymph node biopsy. Secondary to family issues her wide local excision was delayed until early September at which time she underwent a wide local excision. Tumor was 1.3 cm overall grade 1. No lymphovascular invasion was noted. Margins were clear at 2 mm. No sentinel lymph node was identified. Patient patient had a MammaPrint performed showing a high risk for recurrence with a 30% chance of local regional recurrence at 10 years. She's been seen by medical oncology based on her advanced age they have declined systemic chemotherapy. She is now referred to radiation oncology for consideration of treatment. She is doing well she states she is  healing well specifically denies cough bone pain. She does have some issues with her left upper extremity with limited range of motion secondary believe to arthritis. She also has a pacemaker present in her left upper chest wall.  PLANNED TREATMENT REGIMEN: Left breast possibly hypofractionated course of treatment versus 3 field with protection of her pacemaker  PAST MEDICAL HISTORY:  has a past medical history of Allergy; Anemia; Arthritis; Atrial fibrillation ([place]) (2004); CAD (coronary artery disease); Cancer ([place]); Complication of anesthesia; Diabetes ([place]); Dysrhythmia; Heart attack ([place]) (2008); Heart murmur; History of kidney stones; History of sick sinus syndrome; Hyperlipidemia; Macular degeneration; Neuropathy; Neuropathy; Pacemaker (06/2010); and TIA (transient ischemic attack).    PAST SURGICAL HISTORY:  Past Surgical History:  Procedure Laterality Date  . ABDOMINAL HYSTERECTOMY  1960's  . APPENDECTOMY  1947  . [place]  . BREAST BIOPSY Left 12/17/2016   SINGLE FRAGMENT OF ATYPICAL EPITHELIAL CELLS  . BREAST EXCISIONAL BIOPSY Left 01/18/1986   Benign microcalcifications, [employer].  Marland Kitchen BREAST LUMPECTOMY Left 05/26/2017   T1c, N0; ER/ PR+, Her 2 neu negative, HIGH RISK by Mammoprint ;  Surgeon: [name], MD;  Location: [facility] ORS;  Service: General;  Laterality: Left;  . BREAST SURGERY  1988  . CARDIAC CATHETERIZATION  1989  . CATARACT EXTRACTION  1997 and 1998  . COLONOSCOPY WITH PROPOFOL N/A 09/12/2015   Procedure: COLONOSCOPY WITH PROPOFOL;  Surgeon: [name], MD;  Location: [facility] ENDOSCOPY;  Service: Endoscopy;  Laterality: N/A;  . KIDNEY STONE SURGERY  2018  . pace  maker  2008  . [place]  . TONSILLECTOMY AND ADENOIDECTOMY  1950"s    FAMILY HISTORY: family history includes Arthritis in her maternal grandmother; Breast cancer in her maternal aunt; Cancer in her maternal aunt, maternal aunt, and son; Diabetes in her  mother and son; Stroke in her mother.  SOCIAL HISTORY:  reports that she quit smoking about 48 years ago. She quit after 5.00 years of use. She has never used smokeless tobacco. She reports that she does not drink alcohol or use drugs.  ALLERGIES: Lyrica [pregabalin]; Neurontin [gabapentin]; and Bactrim [sulfamethoxazole-trimethoprim]  MEDICATIONS:  Current Outpatient Prescriptions  Medication Sig Dispense Refill  . aspirin 81 MG tablet Take 81 mg by mouth daily.    . Biotin w/ Vitamins C & E (HAIR/SKIN/NAILS PO) Take 1 tablet by mouth daily.    . blood glucose meter kit and supplies Per patient please dispense One touch Ultra2 meter.Use meter and supplies three times a day to check blood sugar. ICD10: E11.40 1 each 0  . Cholecalciferol (D3-50 PO) Take 1 tablet by mouth daily.    . furosemide (LASIX) 20 MG tablet Take 20 mg by mouth daily.     Marland Kitchen glucose blood (ONE TOUCH ULTRA TEST) test strip USE ONE STRIP TO CHECK GLUCOSE THREE TIMES DAILY 100 each 2  . Lactobacillus (PROBIOTIC ACIDOPHILUS PO) Take 1 tablet by mouth daily.     Marland Kitchen MAGNESIUM SULFATE PO Take 1 tablet by mouth daily.     . metFORMIN (GLUCOPHAGE-XR) 500 MG 24 hr tablet TAKE 2 TABLETS BY MOUTH TWICE DAILY (Patient taking differently: TAKE 1 TABLETS BY MOUTH TWICE DAILY) 120 tablet 2  . metoprolol (LOPRESSOR) 50 MG tablet Take 50 mg by mouth daily.     . Multiple Vitamins-Minerals (PRESERVISION AREDS 2 PO) Take 1 tablet by mouth daily.     Vladimir Faster Glycol-Propyl Glycol (SYSTANE OP) Apply 1 drop to eye 2 (two) times daily.    . vitamin B-12 (CYANOCOBALAMIN) 1000 MCG tablet Take 1,000 mcg by mouth daily.     No current facility-administered medications for this encounter.     ECOG PERFORMANCE STATUS:  0 - Asymptomatic  REVIEW OF SYSTEMS:  Patient denies any weight loss, fatigue, weakness, fever, chills or night sweats. Patient denies any loss of vision, blurred vision. Patient denies any ringing  of the ears or hearing loss.  No irregular heartbeat. Patient denies heart murmur or history of fainting. Patient denies any chest pain or pain radiating to her upper extremities. Patient denies any shortness of breath, difficulty breathing at night, cough or hemoptysis. Patient denies any swelling in the lower legs. Patient denies any nausea vomiting, vomiting of blood, or coffee ground material in the vomitus. Patient denies any stomach pain. Patient states has had normal bowel movements no significant constipation or diarrhea. Patient denies any dysuria, hematuria or significant nocturia. Patient denies any problems walking, swelling in the joints or loss of balance. Patient denies any skin changes, loss of hair or loss of weight. Patient denies any excessive worrying or anxiety or significant depression. Patient denies any problems with insomnia. Patient denies excessive thirst, polyuria, polydipsia. Patient denies any swollen glands, patient denies easy bruising or easy bleeding. Patient denies any recent infections, allergies or URI. Patient "s visual fields have not changed significantly in recent time.    PHYSICAL EXAM: BP (!) 165/93   Pulse 84   Temp (!) 96.6 F (35.9 C)   Resp 18   Wt 162 lb  11.2 oz (73.8 kg)   LMP  (LMP Unknown)   BMI 29.76 kg/m  Patient is status post wide local excision of the left breast. No dominant mass or nodularity is noted in either breast in 2 positions examined. No axillary or supraclavicular adenopathy is appreciated. She does have a pacemaker present in her left anterior chest wall. Well-developed well-nourished patient in NAD. HEENT reveals PERLA, EOMI, discs not visualized.  Oral cavity is clear. No oral mucosal lesions are identified. Neck is clear without evidence of cervical or supraclavicular adenopathy. Lungs are clear to A&P. Cardiac examination is essentially unremarkable with regular rate and rhythm without murmur rub or thrill. Abdomen is benign with no organomegaly or masses  noted. Motor sensory and DTR levels are equal and symmetric in the upper and lower extremities. Cranial nerves II through XII are grossly intact. Proprioception is intact. No peripheral adenopathy or edema is identified. No motor or sensory levels are noted. Crude visual fields are within normal range.  LABORATORY DATA: Pathology reports reviewed    RADIOLOGY RESULTS: Mammograms and ultrasound reviewed   IMPRESSION: Stage I invasive mammary carcinoma with MammaPrint showing high risk for local recurrence in 83 year old female  PLAN: At this time I have recommended adjuvant radiation therapy. I may be prohibitive in my intent to treat her peripheral lymphatics based on the positioning and placement of her pacemaker. If I cannot treat her peripheral lymphatics will treat using a hypofractionated course of treatment over 4 weeks boosting her scar another 1400 cGy using electron beam. Risks and benefits of treatment including skin reaction fatigue alteration of blood counts possible inclusion of superficial lung possible slight lymphedema of her left upper extremity all were discussed in detail with the patient and her family. They all seem to comprehend my treatment plan well. I have personally set up and ordered CT simulation for later this week. Patient also will be candidate for antiestrogen therapy after completion of radiation.  I would like to take this opportunity to thank you for allowing me to participate in the care of your patient.Armstead Peaks., MD

## 2017-07-06 DIAGNOSIS — H353221 Exudative age-related macular degeneration, left eye, with active choroidal neovascularization: Secondary | ICD-10-CM | POA: Diagnosis not present

## 2017-07-07 DIAGNOSIS — Z87442 Personal history of urinary calculi: Secondary | ICD-10-CM | POA: Diagnosis not present

## 2017-07-08 ENCOUNTER — Ambulatory Visit
Admission: RE | Admit: 2017-07-08 | Discharge: 2017-07-08 | Disposition: A | Discharge status: Home / Self Care | Payer: Medicare Other | Source: Ambulatory Visit | Attending: Radiation Oncology | Admitting: Radiation Oncology

## 2017-07-08 DIAGNOSIS — D649 Anemia, unspecified: Secondary | ICD-10-CM | POA: Diagnosis not present

## 2017-07-08 DIAGNOSIS — Z87891 Personal history of nicotine dependence: Secondary | ICD-10-CM | POA: Diagnosis not present

## 2017-07-08 DIAGNOSIS — M1990 Unspecified osteoarthritis, unspecified site: Secondary | ICD-10-CM | POA: Diagnosis not present

## 2017-07-08 DIAGNOSIS — Z87442 Personal history of urinary calculi: Secondary | ICD-10-CM | POA: Diagnosis not present

## 2017-07-08 DIAGNOSIS — C50412 Malignant neoplasm of upper-outer quadrant of left female breast: Secondary | ICD-10-CM | POA: Diagnosis not present

## 2017-07-08 DIAGNOSIS — Z7982 Long term (current) use of aspirin: Secondary | ICD-10-CM | POA: Diagnosis not present

## 2017-07-08 DIAGNOSIS — E785 Hyperlipidemia, unspecified: Secondary | ICD-10-CM | POA: Diagnosis not present

## 2017-07-08 DIAGNOSIS — Z8673 Personal history of transient ischemic attack (TIA), and cerebral infarction without residual deficits: Secondary | ICD-10-CM | POA: Diagnosis not present

## 2017-07-08 DIAGNOSIS — I4891 Unspecified atrial fibrillation: Secondary | ICD-10-CM | POA: Diagnosis not present

## 2017-07-08 DIAGNOSIS — E119 Type 2 diabetes mellitus without complications: Secondary | ICD-10-CM | POA: Diagnosis not present

## 2017-07-08 DIAGNOSIS — Z9049 Acquired absence of other specified parts of digestive tract: Secondary | ICD-10-CM | POA: Diagnosis not present

## 2017-07-08 DIAGNOSIS — Z794 Long term (current) use of insulin: Secondary | ICD-10-CM | POA: Diagnosis not present

## 2017-07-08 DIAGNOSIS — Z510 Encounter for antineoplastic radiation therapy: Secondary | ICD-10-CM | POA: Diagnosis not present

## 2017-07-08 DIAGNOSIS — Z79899 Other long term (current) drug therapy: Secondary | ICD-10-CM | POA: Diagnosis not present

## 2017-07-08 DIAGNOSIS — I2510 Atherosclerotic heart disease of native coronary artery without angina pectoris: Secondary | ICD-10-CM | POA: Diagnosis not present

## 2017-07-08 DIAGNOSIS — Z170 Estrogen receptor positive status [ER+]: Secondary | ICD-10-CM | POA: Diagnosis not present

## 2017-07-08 DIAGNOSIS — I252 Old myocardial infarction: Secondary | ICD-10-CM | POA: Diagnosis not present

## 2017-07-08 DIAGNOSIS — Z90710 Acquired absence of both cervix and uterus: Secondary | ICD-10-CM | POA: Diagnosis not present

## 2017-07-08 DIAGNOSIS — Z803 Family history of malignant neoplasm of breast: Secondary | ICD-10-CM | POA: Diagnosis not present

## 2017-07-08 DIAGNOSIS — G629 Polyneuropathy, unspecified: Secondary | ICD-10-CM | POA: Diagnosis not present

## 2017-07-08 DIAGNOSIS — Z950 Presence of cardiac pacemaker: Secondary | ICD-10-CM | POA: Diagnosis not present

## 2017-07-08 DIAGNOSIS — R011 Cardiac murmur, unspecified: Secondary | ICD-10-CM | POA: Diagnosis not present

## 2017-07-09 ENCOUNTER — Other Ambulatory Visit: Payer: Self-pay | Admitting: *Deleted

## 2017-07-09 DIAGNOSIS — Z170 Estrogen receptor positive status [ER+]: Principal | ICD-10-CM

## 2017-07-09 DIAGNOSIS — C50412 Malignant neoplasm of upper-outer quadrant of left female breast: Secondary | ICD-10-CM

## 2017-07-12 DIAGNOSIS — D649 Anemia, unspecified: Secondary | ICD-10-CM | POA: Diagnosis not present

## 2017-07-12 DIAGNOSIS — C50412 Malignant neoplasm of upper-outer quadrant of left female breast: Secondary | ICD-10-CM | POA: Diagnosis not present

## 2017-07-12 DIAGNOSIS — I4891 Unspecified atrial fibrillation: Secondary | ICD-10-CM | POA: Diagnosis not present

## 2017-07-12 DIAGNOSIS — Z170 Estrogen receptor positive status [ER+]: Secondary | ICD-10-CM | POA: Diagnosis not present

## 2017-07-12 DIAGNOSIS — Z510 Encounter for antineoplastic radiation therapy: Secondary | ICD-10-CM | POA: Diagnosis not present

## 2017-07-12 DIAGNOSIS — M1990 Unspecified osteoarthritis, unspecified site: Secondary | ICD-10-CM | POA: Diagnosis not present

## 2017-07-13 ENCOUNTER — Ambulatory Visit (INDEPENDENT_AMBULATORY_CARE_PROVIDER_SITE_OTHER): Payer: Medicare Other | Admitting: Internal Medicine

## 2017-07-13 ENCOUNTER — Encounter: Payer: Self-pay | Admitting: Internal Medicine

## 2017-07-13 VITALS — BP 134/80 | HR 89 | Temp 97.6°F | Resp 17 | Ht 61.81 in | Wt 164.6 lb

## 2017-07-13 DIAGNOSIS — E7800 Pure hypercholesterolemia, unspecified: Secondary | ICD-10-CM | POA: Diagnosis not present

## 2017-07-13 DIAGNOSIS — E118 Type 2 diabetes mellitus with unspecified complications: Secondary | ICD-10-CM | POA: Diagnosis not present

## 2017-07-13 DIAGNOSIS — D509 Iron deficiency anemia, unspecified: Secondary | ICD-10-CM

## 2017-07-13 DIAGNOSIS — F439 Reaction to severe stress, unspecified: Secondary | ICD-10-CM

## 2017-07-13 DIAGNOSIS — I4891 Unspecified atrial fibrillation: Secondary | ICD-10-CM | POA: Diagnosis not present

## 2017-07-13 DIAGNOSIS — C50412 Malignant neoplasm of upper-outer quadrant of left female breast: Secondary | ICD-10-CM

## 2017-07-13 DIAGNOSIS — Z170 Estrogen receptor positive status [ER+]: Secondary | ICD-10-CM | POA: Diagnosis not present

## 2017-07-13 DIAGNOSIS — E559 Vitamin D deficiency, unspecified: Secondary | ICD-10-CM

## 2017-07-13 DIAGNOSIS — R350 Frequency of micturition: Secondary | ICD-10-CM | POA: Diagnosis not present

## 2017-07-13 DIAGNOSIS — Z23 Encounter for immunization: Secondary | ICD-10-CM

## 2017-07-13 DIAGNOSIS — Z87442 Personal history of urinary calculi: Secondary | ICD-10-CM

## 2017-07-13 DIAGNOSIS — I2510 Atherosclerotic heart disease of native coronary artery without angina pectoris: Secondary | ICD-10-CM | POA: Diagnosis not present

## 2017-07-13 LAB — URINALYSIS, ROUTINE W REFLEX MICROSCOPIC
Bilirubin Urine: NEGATIVE
Hgb urine dipstick: NEGATIVE
Ketones, ur: NEGATIVE
Leukocytes, UA: NEGATIVE
Nitrite: POSITIVE — AB
RBC / HPF: NONE SEEN (ref 0–?)
Specific Gravity, Urine: 1.025 (ref 1.000–1.030)
Total Protein, Urine: 30 — AB
Urine Glucose: 250 — AB
Urobilinogen, UA: 0.2 (ref 0.0–1.0)
pH: 6.5 (ref 5.0–8.0)

## 2017-07-13 NOTE — Progress Notes (Signed)
Patient ID: Holly Rose, female   DOB: [birth year], 83 y.o.   MRN: [id number]   Subjective:    Patient ID: Holly Rose, female    DOB: 11-03-1929, 84 y.o.   MRN: [id number]  HPI  Patient here for a scheduled follow up.  She is accompanied by her daughter-n-law Holly Rose).  History obtained from both of them.  Recently diagnosed with breast cancer.  S/p lumpectomy.  Saw Dr Holly Rose. Planning for XRT.  Handling stress ok.  No chest pain.  Breathing stable.  Does report noticing some symptoms c/w uti recently.  Sees Dr Holly Rose, but did not have symptoms when recently evaluated.  States noticed increased frequency over the last few days.  Took AZO last night.  States sugars are averaging 120-160-170 in the am and pm sugars similar.  Trying to watch her diet.     Past Medical History:  Diagnosis Date  . Allergy   . Anemia   . Arthritis   . Atrial fibrillation ([place]) 2004  . CAD (coronary artery disease)   . Cancer ([place])    H/O skin cancer, squamous  . Complication of anesthesia    hard to wake up with general anesthesia  . Diabetes ([place])   . Dysrhythmia    A-fib  . Heart attack ([place]) 2008  . Heart murmur   . History of kidney stones   . History of sick sinus syndrome    s/p pacemaker placement  . Hyperlipidemia   . Macular degeneration   . Neuropathy   . Neuropathy   . Pacemaker 06/2010  . TIA (transient ischemic attack)    Past Surgical History:  Procedure Laterality Date  . ABDOMINAL HYSTERECTOMY  1960's  . APPENDECTOMY  1947  . [place]  . BREAST BIOPSY Left 12/17/2016   SINGLE FRAGMENT OF ATYPICAL EPITHELIAL CELLS  . BREAST EXCISIONAL BIOPSY Left 01/18/1986   Benign microcalcifications, [employer].  Marland Kitchen BREAST LUMPECTOMY Left 05/26/2017   T1c, N0; ER/ PR+, Her 2 neu negative, HIGH RISK by Mammoprint ;  Surgeon: Holly Bellow, MD;  Location: [facility] ORS;  Service: General;  Laterality: Left;  . BREAST SURGERY  1988  . CARDIAC CATHETERIZATION   1989  . CATARACT EXTRACTION  1997 and 1998  . COLONOSCOPY WITH PROPOFOL N/A 09/12/2015   Procedure: COLONOSCOPY WITH PROPOFOL;  Surgeon: Holly Silvas, MD;  Location: [facility] ENDOSCOPY;  Service: Endoscopy;  Laterality: N/A;  . KIDNEY STONE SURGERY  2018  . pace maker  2008  . [place]  . TONSILLECTOMY AND ADENOIDECTOMY  1950"s   Family History  Problem Relation Age of Onset  . Stroke Mother   . Diabetes Mother   . Cancer Maternal Aunt        Breast Cancer  . Breast cancer Maternal Aunt   . Arthritis Maternal Grandmother   . Cancer Maternal Aunt        Lung Cancer  . Diabetes Son   . Cancer Son   . Kidney disease Neg Hx   . Bladder Cancer Neg Hx    Social History   Social History  . Marital status: Married    Spouse name: N/A  . Number of children: N/A  . Years of education: N/A   Social History Main Topics  . Smoking status: Former Smoker    Years: 5.00    Quit date: 09/14/1968  . Smokeless tobacco: Never Used     Comment: quit  1970  . Alcohol use No  . Drug use: No  . Sexual activity: Not Asked   Other Topics Concern  . None   Social History Narrative  . None    Outpatient Encounter Prescriptions as of 07/13/2017  Medication Sig  . aspirin 81 MG tablet Take 81 mg by mouth daily.  . Biotin w/ Vitamins C & E (HAIR/SKIN/NAILS PO) Take 1 tablet by mouth daily.  . blood glucose meter kit and supplies Per patient please dispense One touch Ultra2 meter.Use meter and supplies three times a day to check blood sugar. ICD10: E11.40  . Cholecalciferol (D3-50 PO) Take 1 tablet by mouth daily.  . furosemide (LASIX) 20 MG tablet Take 20 mg by mouth daily.   Marland Kitchen glucose blood (ONE TOUCH ULTRA TEST) test strip USE ONE STRIP TO CHECK GLUCOSE THREE TIMES DAILY  . Lactobacillus (PROBIOTIC ACIDOPHILUS PO) Take 1 tablet by mouth daily.   Marland Kitchen MAGNESIUM SULFATE PO Take 1 tablet by mouth daily.   . metoprolol (LOPRESSOR) 50 MG tablet Take 50 mg by mouth daily.   .  Multiple Vitamins-Minerals (PRESERVISION AREDS 2 PO) Take 1 tablet by mouth daily.   Holly Rose Glycol-Propyl Glycol (SYSTANE OP) Apply 1 drop to eye 2 (two) times daily.  . vitamin B-12 (CYANOCOBALAMIN) 1000 MCG tablet Take 1,000 mcg by mouth daily.  . [DISCONTINUED] metFORMIN (GLUCOPHAGE-XR) 500 MG 24 hr tablet TAKE 2 TABLETS BY MOUTH TWICE DAILY (Patient taking differently: TAKE 1 TABLETS BY MOUTH TWICE DAILY)   No facility-administered encounter medications on file as of 07/13/2017.     Review of Systems  Constitutional: Negative for appetite change and unexpected weight change.  HENT: Negative for congestion and sinus pressure.   Respiratory: Negative for cough, chest tightness and shortness of breath.   Cardiovascular: Negative for chest pain, palpitations and leg swelling.  Gastrointestinal: Negative for nausea and vomiting.       No significant abdominal pain.    Genitourinary: Positive for frequency. Negative for difficulty urinating.  Musculoskeletal: Negative for joint swelling and myalgias.  Skin: Negative for color change and rash.  Neurological: Negative for dizziness, light-headedness and headaches.  Psychiatric/Behavioral: Negative for agitation and dysphoric mood.       Objective:     Blood pressure rechecked by me:  134/78  Physical Exam  Constitutional: She appears well-developed and well-nourished. No distress.  HENT:  Nose: Nose normal.  Mouth/Throat: Oropharynx is clear and moist.  Neck: Neck supple. No thyromegaly present.  Cardiovascular: Normal rate and regular rhythm.   Pulmonary/Chest: Breath sounds normal. No respiratory distress. She has no wheezes.  Abdominal: Soft. Bowel sounds are normal. There is no tenderness.  Musculoskeletal: She exhibits no edema or tenderness.  Lymphadenopathy:    She has no cervical adenopathy.  Skin: No rash noted. No erythema.  Psychiatric: She has a normal mood and affect. Her behavior is normal.    BP 134/80 (BP  Location: Left Arm, Patient Position: Sitting, Cuff Size: Large)   Pulse 89   Temp 97.6 F (36.4 C) (Oral)   Resp 17   Ht 5' 1.81" (1.57 m)   Wt 164 lb 9.6 oz (74.7 kg)   LMP  (LMP Unknown)   SpO2 95%   BMI 30.29 kg/m  Wt Readings from Last 3 Encounters:  07/13/17 164 lb 9.6 oz (74.7 kg)  07/05/17 162 lb 11.2 oz (73.8 kg)  06/28/17 162 lb 1.6 oz (73.5 kg)     Lab Results  Component Value Date  WBC 5.7 08/20/2016   HGB 13.0 08/20/2016   HCT 41.2 08/20/2016   PLT 180 08/20/2016   GLUCOSE 244 (H) 08/20/2016   CHOL 193 08/20/2016   TRIG 115 08/20/2016   HDL 68 08/20/2016   LDLCALC 102 (H) 08/20/2016   ALT 21 08/20/2016   AST 20 08/20/2016   NA 135 08/20/2016   K 4.3 08/20/2016   CL 97 08/20/2016   CREATININE 0.63 08/20/2016   BUN 10 08/20/2016   CO2 22 08/20/2016   TSH 3.22 03/09/2016   HGBA1C 10.5 (H) 08/20/2016   MICROALBUR 65.4 (H) 03/09/2016       Assessment & Plan:   Problem List Items Addressed This Visit    A-fib [facility])    Has pacemaker in place.  Has been followed by Dr Ubaldo Glassing.        CAD (coronary artery disease)    Stable.        History of kidney stones    Seeing urology.        Hypercholesterolemia    Low cholesterol diet and exercise.  Follow lipid panel.        Iron deficiency anemia    Follow cbc and ferritin.       Malignant neoplasm of upper-outer quadrant of left breast in female, estrogen receptor positive ([place])    Followed by Dr Bary Castilla. Seeing Dr Holly Rose and planning to start XRT.        Stress    Increased stress.  Overall handling things well.  Has good support.        Type II diabetes mellitus ([place])    Seeing endocrinology.  Doing well on current regimen.  Sugars as outlined.  No low sugars.  Low carb diet and exercise.        Vitamin D deficiency, unspecified    Follow vitamin D level.         Other Visit Diagnoses    Urinary frequency    -  Primary   Check urinalysis and culture.  will hold on abx until culture  returns unless symptoms progress.  pt comfortable with this plan.     Relevant Orders   Urinalysis, Routine w reflex microscopic (Completed)   Urine Culture (Completed)   Encounter for immunization       Relevant Orders   Flu vaccine HIGH DOSE PF (Completed)       [name], MD

## 2017-07-14 ENCOUNTER — Other Ambulatory Visit: Payer: Self-pay | Admitting: Internal Medicine

## 2017-07-15 ENCOUNTER — Ambulatory Visit
Admission: RE | Admit: 2017-07-15 | Discharge: 2017-07-15 | Disposition: A | Discharge status: Home / Self Care | Payer: Medicare Other | Source: Ambulatory Visit | Attending: Radiation Oncology | Admitting: Radiation Oncology

## 2017-07-15 DIAGNOSIS — Z170 Estrogen receptor positive status [ER+]: Secondary | ICD-10-CM | POA: Diagnosis not present

## 2017-07-15 DIAGNOSIS — M1990 Unspecified osteoarthritis, unspecified site: Secondary | ICD-10-CM | POA: Diagnosis not present

## 2017-07-15 DIAGNOSIS — C50412 Malignant neoplasm of upper-outer quadrant of left female breast: Secondary | ICD-10-CM | POA: Diagnosis not present

## 2017-07-15 DIAGNOSIS — D649 Anemia, unspecified: Secondary | ICD-10-CM | POA: Diagnosis not present

## 2017-07-15 DIAGNOSIS — I4891 Unspecified atrial fibrillation: Secondary | ICD-10-CM | POA: Diagnosis not present

## 2017-07-15 DIAGNOSIS — Z510 Encounter for antineoplastic radiation therapy: Secondary | ICD-10-CM | POA: Diagnosis not present

## 2017-07-16 ENCOUNTER — Encounter: Payer: Self-pay | Admitting: Internal Medicine

## 2017-07-16 ENCOUNTER — Other Ambulatory Visit: Payer: Self-pay

## 2017-07-16 LAB — URINE CULTURE
MICRO NUMBER:: 81215463
SPECIMEN QUALITY:: ADEQUATE

## 2017-07-16 MED ORDER — AMOXICILLIN-POT CLAVULANATE 500-125 MG PO TABS: 1.0000 | ORAL_TABLET | Freq: Two times a day (BID) | ORAL | 0 refills | Status: DC

## 2017-07-16 NOTE — Assessment & Plan Note (Signed)
Seeing endocrinology.  Doing well on current regimen.  Sugars as outlined.  No low sugars.  Low carb diet and exercise.

## 2017-07-16 NOTE — Assessment & Plan Note (Signed)
Low cholesterol diet and exercise.  Follow lipid panel.   

## 2017-07-16 NOTE — Assessment & Plan Note (Signed)
Increased stress.  Overall handling things well.  Has good support.

## 2017-07-16 NOTE — Assessment & Plan Note (Signed)
Follow cbc and ferritin.  

## 2017-07-16 NOTE — Assessment & Plan Note (Signed)
Follow vitamin D level.  

## 2017-07-16 NOTE — Assessment & Plan Note (Signed)
Has pacemaker in place.  Has been followed by Dr Ubaldo Glassing.

## 2017-07-16 NOTE — Assessment & Plan Note (Signed)
Seeing urology

## 2017-07-16 NOTE — Assessment & Plan Note (Signed)
Followed by Dr Bary Castilla. Seeing Dr Donella Stade and planning to start XRT.

## 2017-07-16 NOTE — Assessment & Plan Note (Signed)
Stable

## 2017-07-19 ENCOUNTER — Ambulatory Visit
Admission: RE | Admit: 2017-07-19 | Discharge: 2017-07-19 | Disposition: A | Discharge status: Home / Self Care | Payer: Medicare Other | Source: Ambulatory Visit | Attending: Radiation Oncology | Admitting: Radiation Oncology

## 2017-07-19 DIAGNOSIS — Z170 Estrogen receptor positive status [ER+]: Secondary | ICD-10-CM | POA: Diagnosis not present

## 2017-07-19 DIAGNOSIS — M1990 Unspecified osteoarthritis, unspecified site: Secondary | ICD-10-CM | POA: Diagnosis not present

## 2017-07-19 DIAGNOSIS — I4891 Unspecified atrial fibrillation: Secondary | ICD-10-CM | POA: Diagnosis not present

## 2017-07-19 DIAGNOSIS — D649 Anemia, unspecified: Secondary | ICD-10-CM | POA: Diagnosis not present

## 2017-07-19 DIAGNOSIS — Z510 Encounter for antineoplastic radiation therapy: Secondary | ICD-10-CM | POA: Diagnosis not present

## 2017-07-19 DIAGNOSIS — C50412 Malignant neoplasm of upper-outer quadrant of left female breast: Secondary | ICD-10-CM | POA: Diagnosis not present

## 2017-07-20 ENCOUNTER — Ambulatory Visit
Admission: RE | Admit: 2017-07-20 | Discharge: 2017-07-20 | Disposition: A | Discharge status: Home / Self Care | Payer: Medicare Other | Source: Ambulatory Visit | Attending: Radiation Oncology | Admitting: Radiation Oncology

## 2017-07-20 DIAGNOSIS — Z510 Encounter for antineoplastic radiation therapy: Secondary | ICD-10-CM | POA: Diagnosis not present

## 2017-07-20 DIAGNOSIS — I4891 Unspecified atrial fibrillation: Secondary | ICD-10-CM | POA: Diagnosis not present

## 2017-07-20 DIAGNOSIS — M1990 Unspecified osteoarthritis, unspecified site: Secondary | ICD-10-CM | POA: Diagnosis not present

## 2017-07-20 DIAGNOSIS — Z170 Estrogen receptor positive status [ER+]: Secondary | ICD-10-CM | POA: Diagnosis not present

## 2017-07-20 DIAGNOSIS — D649 Anemia, unspecified: Secondary | ICD-10-CM | POA: Diagnosis not present

## 2017-07-20 DIAGNOSIS — C50412 Malignant neoplasm of upper-outer quadrant of left female breast: Secondary | ICD-10-CM | POA: Diagnosis not present

## 2017-07-21 ENCOUNTER — Ambulatory Visit
Admission: RE | Admit: 2017-07-21 | Discharge: 2017-07-21 | Disposition: A | Discharge status: Home / Self Care | Payer: Medicare Other | Source: Ambulatory Visit | Attending: Radiation Oncology | Admitting: Radiation Oncology

## 2017-07-21 DIAGNOSIS — D649 Anemia, unspecified: Secondary | ICD-10-CM | POA: Diagnosis not present

## 2017-07-21 DIAGNOSIS — Z170 Estrogen receptor positive status [ER+]: Secondary | ICD-10-CM | POA: Diagnosis not present

## 2017-07-21 DIAGNOSIS — Z510 Encounter for antineoplastic radiation therapy: Secondary | ICD-10-CM | POA: Diagnosis not present

## 2017-07-21 DIAGNOSIS — M1990 Unspecified osteoarthritis, unspecified site: Secondary | ICD-10-CM | POA: Diagnosis not present

## 2017-07-21 DIAGNOSIS — I4891 Unspecified atrial fibrillation: Secondary | ICD-10-CM | POA: Diagnosis not present

## 2017-07-21 DIAGNOSIS — C50412 Malignant neoplasm of upper-outer quadrant of left female breast: Secondary | ICD-10-CM | POA: Diagnosis not present

## 2017-07-22 ENCOUNTER — Ambulatory Visit
Admission: RE | Admit: 2017-07-22 | Discharge: 2017-07-22 | Disposition: A | Discharge status: Home / Self Care | Payer: Medicare Other | Source: Ambulatory Visit | Attending: Radiation Oncology | Admitting: Radiation Oncology

## 2017-07-22 DIAGNOSIS — I4891 Unspecified atrial fibrillation: Secondary | ICD-10-CM | POA: Diagnosis not present

## 2017-07-22 DIAGNOSIS — M1990 Unspecified osteoarthritis, unspecified site: Secondary | ICD-10-CM | POA: Diagnosis not present

## 2017-07-22 DIAGNOSIS — D649 Anemia, unspecified: Secondary | ICD-10-CM | POA: Diagnosis not present

## 2017-07-22 DIAGNOSIS — C50412 Malignant neoplasm of upper-outer quadrant of left female breast: Secondary | ICD-10-CM | POA: Diagnosis not present

## 2017-07-22 DIAGNOSIS — Z170 Estrogen receptor positive status [ER+]: Secondary | ICD-10-CM | POA: Diagnosis not present

## 2017-07-22 DIAGNOSIS — Z510 Encounter for antineoplastic radiation therapy: Secondary | ICD-10-CM | POA: Diagnosis not present

## 2017-07-23 ENCOUNTER — Ambulatory Visit
Admission: RE | Admit: 2017-07-23 | Discharge: 2017-07-23 | Disposition: A | Discharge status: Home / Self Care | Payer: Medicare Other | Source: Ambulatory Visit | Attending: Radiation Oncology | Admitting: Radiation Oncology

## 2017-07-23 DIAGNOSIS — C50412 Malignant neoplasm of upper-outer quadrant of left female breast: Secondary | ICD-10-CM | POA: Diagnosis not present

## 2017-07-23 DIAGNOSIS — Z170 Estrogen receptor positive status [ER+]: Secondary | ICD-10-CM | POA: Diagnosis not present

## 2017-07-23 DIAGNOSIS — I4891 Unspecified atrial fibrillation: Secondary | ICD-10-CM | POA: Diagnosis not present

## 2017-07-23 DIAGNOSIS — M1990 Unspecified osteoarthritis, unspecified site: Secondary | ICD-10-CM | POA: Diagnosis not present

## 2017-07-23 DIAGNOSIS — Z510 Encounter for antineoplastic radiation therapy: Secondary | ICD-10-CM | POA: Diagnosis not present

## 2017-07-23 DIAGNOSIS — D649 Anemia, unspecified: Secondary | ICD-10-CM | POA: Diagnosis not present

## 2017-07-26 ENCOUNTER — Ambulatory Visit (INDEPENDENT_AMBULATORY_CARE_PROVIDER_SITE_OTHER): Payer: Medicare Other | Admitting: Podiatry

## 2017-07-26 ENCOUNTER — Encounter: Payer: Self-pay | Admitting: Podiatry

## 2017-07-26 ENCOUNTER — Ambulatory Visit
Admission: RE | Admit: 2017-07-26 | Discharge: 2017-07-26 | Disposition: A | Discharge status: Home / Self Care | Payer: Medicare Other | Source: Ambulatory Visit | Attending: Radiation Oncology | Admitting: Radiation Oncology

## 2017-07-26 DIAGNOSIS — Z510 Encounter for antineoplastic radiation therapy: Secondary | ICD-10-CM | POA: Diagnosis not present

## 2017-07-26 DIAGNOSIS — I4891 Unspecified atrial fibrillation: Secondary | ICD-10-CM | POA: Diagnosis not present

## 2017-07-26 DIAGNOSIS — B351 Tinea unguium: Secondary | ICD-10-CM | POA: Diagnosis not present

## 2017-07-26 DIAGNOSIS — E1142 Type 2 diabetes mellitus with diabetic polyneuropathy: Secondary | ICD-10-CM

## 2017-07-26 DIAGNOSIS — D649 Anemia, unspecified: Secondary | ICD-10-CM | POA: Diagnosis not present

## 2017-07-26 DIAGNOSIS — M79609 Pain in unspecified limb: Secondary | ICD-10-CM

## 2017-07-26 DIAGNOSIS — M1990 Unspecified osteoarthritis, unspecified site: Secondary | ICD-10-CM | POA: Diagnosis not present

## 2017-07-26 DIAGNOSIS — Z170 Estrogen receptor positive status [ER+]: Secondary | ICD-10-CM | POA: Diagnosis not present

## 2017-07-26 DIAGNOSIS — C50412 Malignant neoplasm of upper-outer quadrant of left female breast: Secondary | ICD-10-CM | POA: Diagnosis not present

## 2017-07-26 NOTE — Progress Notes (Signed)
She presents today for routine nail debridement.  Objective: Toenails are long thick yellow dystrophic mycotic bilateral. They're painful on palpation as well as debridement. Pulses remain palpable.  Assessment: Pain in limb secondary to onychomycosis.  Plan: Debridement of toenails 1 through 5 bilateral.

## 2017-07-27 ENCOUNTER — Ambulatory Visit
Admission: RE | Admit: 2017-07-27 | Discharge: 2017-07-27 | Disposition: A | Discharge status: Home / Self Care | Payer: Medicare Other | Source: Ambulatory Visit | Attending: Radiation Oncology | Admitting: Radiation Oncology

## 2017-07-27 DIAGNOSIS — Z510 Encounter for antineoplastic radiation therapy: Secondary | ICD-10-CM | POA: Diagnosis not present

## 2017-07-27 DIAGNOSIS — M1990 Unspecified osteoarthritis, unspecified site: Secondary | ICD-10-CM | POA: Diagnosis not present

## 2017-07-27 DIAGNOSIS — Z170 Estrogen receptor positive status [ER+]: Secondary | ICD-10-CM | POA: Diagnosis not present

## 2017-07-27 DIAGNOSIS — I4891 Unspecified atrial fibrillation: Secondary | ICD-10-CM | POA: Diagnosis not present

## 2017-07-27 DIAGNOSIS — D649 Anemia, unspecified: Secondary | ICD-10-CM | POA: Diagnosis not present

## 2017-07-27 DIAGNOSIS — C50412 Malignant neoplasm of upper-outer quadrant of left female breast: Secondary | ICD-10-CM | POA: Diagnosis not present

## 2017-07-28 ENCOUNTER — Ambulatory Visit: Payer: Medicare Other | Admitting: Podiatry

## 2017-07-28 ENCOUNTER — Ambulatory Visit
Admission: RE | Admit: 2017-07-28 | Discharge: 2017-07-28 | Disposition: A | Discharge status: Home / Self Care | Payer: Medicare Other | Source: Ambulatory Visit | Attending: Radiation Oncology | Admitting: Radiation Oncology

## 2017-07-28 DIAGNOSIS — I4891 Unspecified atrial fibrillation: Secondary | ICD-10-CM | POA: Diagnosis not present

## 2017-07-28 DIAGNOSIS — C50412 Malignant neoplasm of upper-outer quadrant of left female breast: Secondary | ICD-10-CM | POA: Diagnosis not present

## 2017-07-28 DIAGNOSIS — Z170 Estrogen receptor positive status [ER+]: Secondary | ICD-10-CM | POA: Diagnosis not present

## 2017-07-28 DIAGNOSIS — D0439 Carcinoma in situ of skin of other parts of face: Secondary | ICD-10-CM | POA: Diagnosis not present

## 2017-07-28 DIAGNOSIS — D649 Anemia, unspecified: Secondary | ICD-10-CM | POA: Diagnosis not present

## 2017-07-28 DIAGNOSIS — M1990 Unspecified osteoarthritis, unspecified site: Secondary | ICD-10-CM | POA: Diagnosis not present

## 2017-07-28 DIAGNOSIS — D485 Neoplasm of uncertain behavior of skin: Secondary | ICD-10-CM | POA: Diagnosis not present

## 2017-07-28 DIAGNOSIS — Z85828 Personal history of other malignant neoplasm of skin: Secondary | ICD-10-CM | POA: Diagnosis not present

## 2017-07-28 DIAGNOSIS — Z510 Encounter for antineoplastic radiation therapy: Secondary | ICD-10-CM | POA: Diagnosis not present

## 2017-07-28 DIAGNOSIS — Z08 Encounter for follow-up examination after completed treatment for malignant neoplasm: Secondary | ICD-10-CM | POA: Diagnosis not present

## 2017-07-28 DIAGNOSIS — L905 Scar conditions and fibrosis of skin: Secondary | ICD-10-CM | POA: Diagnosis not present

## 2017-07-29 ENCOUNTER — Ambulatory Visit
Admission: RE | Admit: 2017-07-29 | Discharge: 2017-07-29 | Disposition: A | Discharge status: Home / Self Care | Payer: Medicare Other | Source: Ambulatory Visit | Attending: Radiation Oncology | Admitting: Radiation Oncology

## 2017-07-29 DIAGNOSIS — I4891 Unspecified atrial fibrillation: Secondary | ICD-10-CM | POA: Diagnosis not present

## 2017-07-29 DIAGNOSIS — Z170 Estrogen receptor positive status [ER+]: Secondary | ICD-10-CM | POA: Diagnosis not present

## 2017-07-29 DIAGNOSIS — D649 Anemia, unspecified: Secondary | ICD-10-CM | POA: Diagnosis not present

## 2017-07-29 DIAGNOSIS — Z510 Encounter for antineoplastic radiation therapy: Secondary | ICD-10-CM | POA: Diagnosis not present

## 2017-07-29 DIAGNOSIS — M1990 Unspecified osteoarthritis, unspecified site: Secondary | ICD-10-CM | POA: Diagnosis not present

## 2017-07-29 DIAGNOSIS — C50412 Malignant neoplasm of upper-outer quadrant of left female breast: Secondary | ICD-10-CM | POA: Diagnosis not present

## 2017-07-30 ENCOUNTER — Ambulatory Visit
Admission: RE | Admit: 2017-07-30 | Discharge: 2017-07-30 | Disposition: A | Discharge status: Home / Self Care | Payer: Medicare Other | Source: Ambulatory Visit | Attending: Radiation Oncology | Admitting: Radiation Oncology

## 2017-07-30 DIAGNOSIS — D649 Anemia, unspecified: Secondary | ICD-10-CM | POA: Diagnosis not present

## 2017-07-30 DIAGNOSIS — M1990 Unspecified osteoarthritis, unspecified site: Secondary | ICD-10-CM | POA: Diagnosis not present

## 2017-07-30 DIAGNOSIS — C50412 Malignant neoplasm of upper-outer quadrant of left female breast: Secondary | ICD-10-CM | POA: Diagnosis not present

## 2017-07-30 DIAGNOSIS — I4891 Unspecified atrial fibrillation: Secondary | ICD-10-CM | POA: Diagnosis not present

## 2017-07-30 DIAGNOSIS — Z170 Estrogen receptor positive status [ER+]: Secondary | ICD-10-CM | POA: Diagnosis not present

## 2017-07-30 DIAGNOSIS — Z510 Encounter for antineoplastic radiation therapy: Secondary | ICD-10-CM | POA: Diagnosis not present

## 2017-08-02 ENCOUNTER — Ambulatory Visit
Admission: RE | Admit: 2017-08-02 | Discharge: 2017-08-02 | Disposition: A | Discharge status: Home / Self Care | Payer: Medicare Other | Source: Ambulatory Visit | Attending: Radiation Oncology | Admitting: Radiation Oncology

## 2017-08-02 DIAGNOSIS — D649 Anemia, unspecified: Secondary | ICD-10-CM | POA: Diagnosis not present

## 2017-08-02 DIAGNOSIS — C50412 Malignant neoplasm of upper-outer quadrant of left female breast: Secondary | ICD-10-CM | POA: Diagnosis not present

## 2017-08-02 DIAGNOSIS — Z510 Encounter for antineoplastic radiation therapy: Secondary | ICD-10-CM | POA: Diagnosis not present

## 2017-08-02 DIAGNOSIS — M1990 Unspecified osteoarthritis, unspecified site: Secondary | ICD-10-CM | POA: Diagnosis not present

## 2017-08-02 DIAGNOSIS — I4891 Unspecified atrial fibrillation: Secondary | ICD-10-CM | POA: Diagnosis not present

## 2017-08-02 DIAGNOSIS — Z170 Estrogen receptor positive status [ER+]: Secondary | ICD-10-CM | POA: Diagnosis not present

## 2017-08-03 ENCOUNTER — Inpatient Hospital Stay: Payer: Medicare Other | Attending: Oncology

## 2017-08-03 ENCOUNTER — Other Ambulatory Visit: Payer: Self-pay | Admitting: Internal Medicine

## 2017-08-03 ENCOUNTER — Ambulatory Visit
Admission: RE | Admit: 2017-08-03 | Discharge: 2017-08-03 | Disposition: A | Discharge status: Home / Self Care | Payer: Medicare Other | Source: Ambulatory Visit | Attending: Radiation Oncology | Admitting: Radiation Oncology

## 2017-08-03 DIAGNOSIS — M1990 Unspecified osteoarthritis, unspecified site: Secondary | ICD-10-CM | POA: Diagnosis not present

## 2017-08-03 DIAGNOSIS — Z170 Estrogen receptor positive status [ER+]: Secondary | ICD-10-CM | POA: Insufficient documentation

## 2017-08-03 DIAGNOSIS — C50412 Malignant neoplasm of upper-outer quadrant of left female breast: Secondary | ICD-10-CM | POA: Diagnosis not present

## 2017-08-03 DIAGNOSIS — Z510 Encounter for antineoplastic radiation therapy: Secondary | ICD-10-CM | POA: Diagnosis not present

## 2017-08-03 DIAGNOSIS — I4891 Unspecified atrial fibrillation: Secondary | ICD-10-CM | POA: Diagnosis not present

## 2017-08-03 DIAGNOSIS — D649 Anemia, unspecified: Secondary | ICD-10-CM | POA: Diagnosis not present

## 2017-08-03 DIAGNOSIS — E1140 Type 2 diabetes mellitus with diabetic neuropathy, unspecified: Secondary | ICD-10-CM

## 2017-08-03 DIAGNOSIS — H353211 Exudative age-related macular degeneration, right eye, with active choroidal neovascularization: Secondary | ICD-10-CM | POA: Diagnosis not present

## 2017-08-03 LAB — CBC
HCT: 36.7 % (ref 35.0–47.0)
Hemoglobin: 12 g/dL (ref 12.0–16.0)
MCH: 28 pg (ref 26.0–34.0)
MCHC: 32.6 g/dL (ref 32.0–36.0)
MCV: 85.9 fL (ref 80.0–100.0)
Platelets: 190 10*3/uL (ref 150–440)
RBC: 4.27 MIL/uL (ref 3.80–5.20)
RDW: 16.2 % — ABNORMAL HIGH (ref 11.5–14.5)
WBC: 6.2 10*3/uL (ref 3.6–11.0)

## 2017-08-04 ENCOUNTER — Ambulatory Visit
Admission: RE | Admit: 2017-08-04 | Discharge: 2017-08-04 | Disposition: A | Discharge status: Home / Self Care | Payer: Medicare Other | Source: Ambulatory Visit | Attending: Radiation Oncology | Admitting: Radiation Oncology

## 2017-08-04 DIAGNOSIS — M1990 Unspecified osteoarthritis, unspecified site: Secondary | ICD-10-CM | POA: Diagnosis not present

## 2017-08-04 DIAGNOSIS — I4891 Unspecified atrial fibrillation: Secondary | ICD-10-CM | POA: Diagnosis not present

## 2017-08-04 DIAGNOSIS — Z510 Encounter for antineoplastic radiation therapy: Secondary | ICD-10-CM | POA: Diagnosis not present

## 2017-08-04 DIAGNOSIS — D649 Anemia, unspecified: Secondary | ICD-10-CM | POA: Diagnosis not present

## 2017-08-04 DIAGNOSIS — Z170 Estrogen receptor positive status [ER+]: Secondary | ICD-10-CM | POA: Diagnosis not present

## 2017-08-04 DIAGNOSIS — C50412 Malignant neoplasm of upper-outer quadrant of left female breast: Secondary | ICD-10-CM | POA: Diagnosis not present

## 2017-08-09 ENCOUNTER — Ambulatory Visit
Admission: RE | Admit: 2017-08-09 | Discharge: 2017-08-09 | Disposition: A | Discharge status: Home / Self Care | Payer: Medicare Other | Source: Ambulatory Visit | Attending: Radiation Oncology | Admitting: Radiation Oncology

## 2017-08-09 DIAGNOSIS — I4891 Unspecified atrial fibrillation: Secondary | ICD-10-CM | POA: Diagnosis not present

## 2017-08-09 DIAGNOSIS — Z170 Estrogen receptor positive status [ER+]: Secondary | ICD-10-CM | POA: Diagnosis not present

## 2017-08-09 DIAGNOSIS — M1990 Unspecified osteoarthritis, unspecified site: Secondary | ICD-10-CM | POA: Diagnosis not present

## 2017-08-09 DIAGNOSIS — Z510 Encounter for antineoplastic radiation therapy: Secondary | ICD-10-CM | POA: Diagnosis not present

## 2017-08-09 DIAGNOSIS — D649 Anemia, unspecified: Secondary | ICD-10-CM | POA: Diagnosis not present

## 2017-08-09 DIAGNOSIS — C50412 Malignant neoplasm of upper-outer quadrant of left female breast: Secondary | ICD-10-CM | POA: Diagnosis not present

## 2017-08-10 ENCOUNTER — Ambulatory Visit
Admission: RE | Admit: 2017-08-10 | Discharge: 2017-08-10 | Disposition: A | Discharge status: Home / Self Care | Payer: Medicare Other | Source: Ambulatory Visit | Attending: Radiation Oncology | Admitting: Radiation Oncology

## 2017-08-10 DIAGNOSIS — C50412 Malignant neoplasm of upper-outer quadrant of left female breast: Secondary | ICD-10-CM | POA: Diagnosis not present

## 2017-08-10 DIAGNOSIS — D649 Anemia, unspecified: Secondary | ICD-10-CM | POA: Diagnosis not present

## 2017-08-10 DIAGNOSIS — Z170 Estrogen receptor positive status [ER+]: Secondary | ICD-10-CM | POA: Diagnosis not present

## 2017-08-10 DIAGNOSIS — M1990 Unspecified osteoarthritis, unspecified site: Secondary | ICD-10-CM | POA: Diagnosis not present

## 2017-08-10 DIAGNOSIS — I4891 Unspecified atrial fibrillation: Secondary | ICD-10-CM | POA: Diagnosis not present

## 2017-08-10 DIAGNOSIS — Z510 Encounter for antineoplastic radiation therapy: Secondary | ICD-10-CM | POA: Diagnosis not present

## 2017-08-11 ENCOUNTER — Ambulatory Visit
Admission: RE | Admit: 2017-08-11 | Discharge: 2017-08-11 | Disposition: A | Discharge status: Home / Self Care | Payer: Medicare Other | Source: Ambulatory Visit | Attending: Radiation Oncology | Admitting: Radiation Oncology

## 2017-08-11 DIAGNOSIS — M1990 Unspecified osteoarthritis, unspecified site: Secondary | ICD-10-CM | POA: Diagnosis not present

## 2017-08-11 DIAGNOSIS — I4891 Unspecified atrial fibrillation: Secondary | ICD-10-CM | POA: Diagnosis not present

## 2017-08-11 DIAGNOSIS — C50412 Malignant neoplasm of upper-outer quadrant of left female breast: Secondary | ICD-10-CM | POA: Diagnosis not present

## 2017-08-11 DIAGNOSIS — D649 Anemia, unspecified: Secondary | ICD-10-CM | POA: Diagnosis not present

## 2017-08-11 DIAGNOSIS — Z170 Estrogen receptor positive status [ER+]: Secondary | ICD-10-CM | POA: Diagnosis not present

## 2017-08-11 DIAGNOSIS — Z510 Encounter for antineoplastic radiation therapy: Secondary | ICD-10-CM | POA: Diagnosis not present

## 2017-08-12 ENCOUNTER — Ambulatory Visit
Admission: RE | Admit: 2017-08-12 | Discharge: 2017-08-12 | Disposition: A | Discharge status: Home / Self Care | Payer: Medicare Other | Source: Ambulatory Visit | Attending: Radiation Oncology | Admitting: Radiation Oncology

## 2017-08-12 DIAGNOSIS — Z170 Estrogen receptor positive status [ER+]: Secondary | ICD-10-CM | POA: Diagnosis not present

## 2017-08-12 DIAGNOSIS — Z510 Encounter for antineoplastic radiation therapy: Secondary | ICD-10-CM | POA: Diagnosis not present

## 2017-08-12 DIAGNOSIS — C50412 Malignant neoplasm of upper-outer quadrant of left female breast: Secondary | ICD-10-CM | POA: Diagnosis not present

## 2017-08-12 DIAGNOSIS — M1990 Unspecified osteoarthritis, unspecified site: Secondary | ICD-10-CM | POA: Diagnosis not present

## 2017-08-12 DIAGNOSIS — I4891 Unspecified atrial fibrillation: Secondary | ICD-10-CM | POA: Diagnosis not present

## 2017-08-12 DIAGNOSIS — D649 Anemia, unspecified: Secondary | ICD-10-CM | POA: Diagnosis not present

## 2017-08-13 ENCOUNTER — Ambulatory Visit
Admission: RE | Admit: 2017-08-13 | Discharge: 2017-08-13 | Disposition: A | Discharge status: Home / Self Care | Payer: Medicare Other | Source: Ambulatory Visit | Attending: Radiation Oncology | Admitting: Radiation Oncology

## 2017-08-13 DIAGNOSIS — I4891 Unspecified atrial fibrillation: Secondary | ICD-10-CM | POA: Diagnosis not present

## 2017-08-13 DIAGNOSIS — M1990 Unspecified osteoarthritis, unspecified site: Secondary | ICD-10-CM | POA: Diagnosis not present

## 2017-08-13 DIAGNOSIS — C50412 Malignant neoplasm of upper-outer quadrant of left female breast: Secondary | ICD-10-CM | POA: Diagnosis not present

## 2017-08-13 DIAGNOSIS — D649 Anemia, unspecified: Secondary | ICD-10-CM | POA: Diagnosis not present

## 2017-08-13 DIAGNOSIS — Z170 Estrogen receptor positive status [ER+]: Secondary | ICD-10-CM | POA: Diagnosis not present

## 2017-08-13 DIAGNOSIS — Z510 Encounter for antineoplastic radiation therapy: Secondary | ICD-10-CM | POA: Diagnosis not present

## 2017-08-16 ENCOUNTER — Ambulatory Visit
Admission: RE | Admit: 2017-08-16 | Discharge: 2017-08-16 | Disposition: A | Discharge status: Home / Self Care | Payer: Medicare Other | Source: Ambulatory Visit | Attending: Radiation Oncology | Admitting: Radiation Oncology

## 2017-08-16 DIAGNOSIS — I4891 Unspecified atrial fibrillation: Secondary | ICD-10-CM | POA: Diagnosis not present

## 2017-08-16 DIAGNOSIS — Z510 Encounter for antineoplastic radiation therapy: Secondary | ICD-10-CM | POA: Diagnosis not present

## 2017-08-16 DIAGNOSIS — C50412 Malignant neoplasm of upper-outer quadrant of left female breast: Secondary | ICD-10-CM | POA: Diagnosis not present

## 2017-08-16 DIAGNOSIS — M1990 Unspecified osteoarthritis, unspecified site: Secondary | ICD-10-CM | POA: Diagnosis not present

## 2017-08-16 DIAGNOSIS — D649 Anemia, unspecified: Secondary | ICD-10-CM | POA: Diagnosis not present

## 2017-08-16 DIAGNOSIS — Z170 Estrogen receptor positive status [ER+]: Secondary | ICD-10-CM | POA: Diagnosis not present

## 2017-08-17 ENCOUNTER — Inpatient Hospital Stay: Payer: Medicare Other

## 2017-08-17 ENCOUNTER — Ambulatory Visit
Admission: RE | Admit: 2017-08-17 | Discharge: 2017-08-17 | Disposition: A | Discharge status: Home / Self Care | Payer: Medicare Other | Source: Ambulatory Visit | Attending: Radiation Oncology | Admitting: Radiation Oncology

## 2017-08-17 DIAGNOSIS — Z510 Encounter for antineoplastic radiation therapy: Secondary | ICD-10-CM | POA: Diagnosis not present

## 2017-08-17 DIAGNOSIS — I4891 Unspecified atrial fibrillation: Secondary | ICD-10-CM | POA: Diagnosis not present

## 2017-08-17 DIAGNOSIS — D649 Anemia, unspecified: Secondary | ICD-10-CM | POA: Diagnosis not present

## 2017-08-17 DIAGNOSIS — M1990 Unspecified osteoarthritis, unspecified site: Secondary | ICD-10-CM | POA: Diagnosis not present

## 2017-08-17 DIAGNOSIS — Z170 Estrogen receptor positive status [ER+]: Secondary | ICD-10-CM | POA: Diagnosis not present

## 2017-08-17 DIAGNOSIS — C50412 Malignant neoplasm of upper-outer quadrant of left female breast: Secondary | ICD-10-CM | POA: Diagnosis not present

## 2017-08-18 ENCOUNTER — Ambulatory Visit
Admission: RE | Admit: 2017-08-18 | Discharge: 2017-08-18 | Disposition: A | Discharge status: Home / Self Care | Payer: Medicare Other | Source: Ambulatory Visit | Attending: Radiation Oncology | Admitting: Radiation Oncology

## 2017-08-18 DIAGNOSIS — D649 Anemia, unspecified: Secondary | ICD-10-CM | POA: Diagnosis not present

## 2017-08-18 DIAGNOSIS — I4891 Unspecified atrial fibrillation: Secondary | ICD-10-CM | POA: Diagnosis not present

## 2017-08-18 DIAGNOSIS — Z510 Encounter for antineoplastic radiation therapy: Secondary | ICD-10-CM | POA: Diagnosis not present

## 2017-08-18 DIAGNOSIS — M1990 Unspecified osteoarthritis, unspecified site: Secondary | ICD-10-CM | POA: Diagnosis not present

## 2017-08-18 DIAGNOSIS — Z170 Estrogen receptor positive status [ER+]: Secondary | ICD-10-CM | POA: Diagnosis not present

## 2017-08-18 DIAGNOSIS — C50412 Malignant neoplasm of upper-outer quadrant of left female breast: Secondary | ICD-10-CM | POA: Diagnosis not present

## 2017-08-19 ENCOUNTER — Ambulatory Visit
Admission: RE | Admit: 2017-08-19 | Discharge: 2017-08-19 | Disposition: A | Discharge status: Home / Self Care | Payer: Medicare Other | Source: Ambulatory Visit | Attending: Radiation Oncology | Admitting: Radiation Oncology

## 2017-08-19 DIAGNOSIS — Z170 Estrogen receptor positive status [ER+]: Secondary | ICD-10-CM | POA: Diagnosis not present

## 2017-08-19 DIAGNOSIS — M1990 Unspecified osteoarthritis, unspecified site: Secondary | ICD-10-CM | POA: Diagnosis not present

## 2017-08-19 DIAGNOSIS — I4891 Unspecified atrial fibrillation: Secondary | ICD-10-CM | POA: Diagnosis not present

## 2017-08-19 DIAGNOSIS — D649 Anemia, unspecified: Secondary | ICD-10-CM | POA: Diagnosis not present

## 2017-08-19 DIAGNOSIS — Z510 Encounter for antineoplastic radiation therapy: Secondary | ICD-10-CM | POA: Diagnosis not present

## 2017-08-19 DIAGNOSIS — C50412 Malignant neoplasm of upper-outer quadrant of left female breast: Secondary | ICD-10-CM | POA: Diagnosis not present

## 2017-08-20 ENCOUNTER — Ambulatory Visit
Admission: RE | Admit: 2017-08-20 | Discharge: 2017-08-20 | Disposition: A | Discharge status: Home / Self Care | Payer: Medicare Other | Source: Ambulatory Visit | Attending: Radiation Oncology | Admitting: Radiation Oncology

## 2017-08-20 DIAGNOSIS — Z510 Encounter for antineoplastic radiation therapy: Secondary | ICD-10-CM | POA: Diagnosis not present

## 2017-08-20 DIAGNOSIS — M1990 Unspecified osteoarthritis, unspecified site: Secondary | ICD-10-CM | POA: Diagnosis not present

## 2017-08-20 DIAGNOSIS — Z170 Estrogen receptor positive status [ER+]: Secondary | ICD-10-CM | POA: Diagnosis not present

## 2017-08-20 DIAGNOSIS — I4891 Unspecified atrial fibrillation: Secondary | ICD-10-CM | POA: Diagnosis not present

## 2017-08-20 DIAGNOSIS — D649 Anemia, unspecified: Secondary | ICD-10-CM | POA: Diagnosis not present

## 2017-08-20 DIAGNOSIS — C50412 Malignant neoplasm of upper-outer quadrant of left female breast: Secondary | ICD-10-CM | POA: Diagnosis not present

## 2017-08-23 ENCOUNTER — Ambulatory Visit: Payer: Medicare Other

## 2017-08-24 ENCOUNTER — Ambulatory Visit: Payer: Medicare Other

## 2017-08-24 ENCOUNTER — Ambulatory Visit
Admission: RE | Admit: 2017-08-24 | Discharge: 2017-08-24 | Disposition: A | Discharge status: Home / Self Care | Payer: Medicare Other | Source: Ambulatory Visit | Attending: Radiation Oncology | Admitting: Radiation Oncology

## 2017-08-24 ENCOUNTER — Inpatient Hospital Stay: Payer: Medicare Other | Attending: Oncology | Admitting: Oncology

## 2017-08-24 VITALS — BP 121/78 | HR 82 | Temp 97.3°F | Resp 18 | Wt 163.0 lb

## 2017-08-24 DIAGNOSIS — Z7984 Long term (current) use of oral hypoglycemic drugs: Secondary | ICD-10-CM

## 2017-08-24 DIAGNOSIS — I2510 Atherosclerotic heart disease of native coronary artery without angina pectoris: Secondary | ICD-10-CM | POA: Diagnosis not present

## 2017-08-24 DIAGNOSIS — E785 Hyperlipidemia, unspecified: Secondary | ICD-10-CM | POA: Diagnosis not present

## 2017-08-24 DIAGNOSIS — M810 Age-related osteoporosis without current pathological fracture: Secondary | ICD-10-CM | POA: Insufficient documentation

## 2017-08-24 DIAGNOSIS — D649 Anemia, unspecified: Secondary | ICD-10-CM | POA: Diagnosis not present

## 2017-08-24 DIAGNOSIS — E119 Type 2 diabetes mellitus without complications: Secondary | ICD-10-CM | POA: Diagnosis not present

## 2017-08-24 DIAGNOSIS — Z79899 Other long term (current) drug therapy: Secondary | ICD-10-CM | POA: Diagnosis not present

## 2017-08-24 DIAGNOSIS — Z170 Estrogen receptor positive status [ER+]: Secondary | ICD-10-CM | POA: Insufficient documentation

## 2017-08-24 DIAGNOSIS — I4891 Unspecified atrial fibrillation: Secondary | ICD-10-CM | POA: Diagnosis not present

## 2017-08-24 DIAGNOSIS — Z85820 Personal history of malignant melanoma of skin: Secondary | ICD-10-CM | POA: Insufficient documentation

## 2017-08-24 DIAGNOSIS — Z79810 Long term (current) use of selective estrogen receptor modulators (SERMs): Secondary | ICD-10-CM | POA: Insufficient documentation

## 2017-08-24 DIAGNOSIS — Z7982 Long term (current) use of aspirin: Secondary | ICD-10-CM | POA: Diagnosis not present

## 2017-08-24 DIAGNOSIS — Z803 Family history of malignant neoplasm of breast: Secondary | ICD-10-CM | POA: Insufficient documentation

## 2017-08-24 DIAGNOSIS — I252 Old myocardial infarction: Secondary | ICD-10-CM | POA: Diagnosis not present

## 2017-08-24 DIAGNOSIS — Z87891 Personal history of nicotine dependence: Secondary | ICD-10-CM

## 2017-08-24 DIAGNOSIS — M1990 Unspecified osteoarthritis, unspecified site: Secondary | ICD-10-CM | POA: Diagnosis not present

## 2017-08-24 DIAGNOSIS — Z923 Personal history of irradiation: Secondary | ICD-10-CM | POA: Insufficient documentation

## 2017-08-24 DIAGNOSIS — G629 Polyneuropathy, unspecified: Secondary | ICD-10-CM | POA: Insufficient documentation

## 2017-08-24 DIAGNOSIS — Z800 Family history of malignant neoplasm of digestive organs: Secondary | ICD-10-CM | POA: Insufficient documentation

## 2017-08-24 DIAGNOSIS — Z8673 Personal history of transient ischemic attack (TIA), and cerebral infarction without residual deficits: Secondary | ICD-10-CM | POA: Diagnosis not present

## 2017-08-24 DIAGNOSIS — Z801 Family history of malignant neoplasm of trachea, bronchus and lung: Secondary | ICD-10-CM

## 2017-08-24 DIAGNOSIS — M129 Arthropathy, unspecified: Secondary | ICD-10-CM | POA: Insufficient documentation

## 2017-08-24 DIAGNOSIS — C50412 Malignant neoplasm of upper-outer quadrant of left female breast: Secondary | ICD-10-CM | POA: Diagnosis not present

## 2017-08-24 DIAGNOSIS — Z510 Encounter for antineoplastic radiation therapy: Secondary | ICD-10-CM | POA: Diagnosis not present

## 2017-08-24 MED ORDER — TAMOXIFEN CITRATE 20 MG PO TABS: 20.0000 mg | ORAL_TABLET | Freq: Every day | ORAL | 11 refills | Status: DC

## 2017-08-24 NOTE — Progress Notes (Signed)
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  INTERVAL HISTORY: Patient returns to clinic today for further evaluation and initiation of tamoxifen.  She will complete her adjuvant XRT today. She currently feels well and is asymptomatic. She has no neurologic complaints. She denies any recent fevers or illnesses. She has a good appetite and denies weight loss. She has no chest pain or shortness of breath. She denies any nausea, vomiting, constipation, or diarrhea. She has no urinary complaints. Patient feels at her baseline and offers no specific complaints today.  REVIEW OF SYSTEMS:   Review of Systems  Constitutional: Negative.  Negative for fever, malaise/fatigue and weight loss.  Respiratory: Negative.  Negative for cough and shortness of breath.   Cardiovascular: Negative.  Negative for chest pain and leg swelling.  Gastrointestinal: Negative.  Negative for abdominal pain, blood in stool and melena.  Genitourinary: Negative.   Musculoskeletal: Negative.   Skin: Negative.  Negative for rash.  Neurological: Negative.  Negative for weakness.  Psychiatric/Behavioral: Negative.  The patient is not nervous/anxious.     As per HPI. Otherwise, a complete review of systems is negative.  PAST MEDICAL HISTORY: Past Medical History:  Diagnosis Date  . Allergy   . Anemia   . Arthritis   . Atrial fibrillation ([place]) 2004  . CAD (coronary artery disease)   . Cancer ([place])    H/O skin cancer, squamous  . Complication of anesthesia    hard to wake up with general anesthesia  . Diabetes ([place])   .  Dysrhythmia    A-fib  . Heart attack ([place]) 2008  . Heart murmur   . History of kidney stones   . History of sick sinus syndrome    s/p pacemaker placement  . Hyperlipidemia   . Macular degeneration   . Neuropathy   . Neuropathy   . Pacemaker 06/2010  . TIA (transient ischemic attack)     PAST SURGICAL HISTORY: Past Surgical History:  Procedure Laterality Date  . ABDOMINAL HYSTERECTOMY  1960's  . APPENDECTOMY  1947  . [place]  . BREAST BIOPSY Left 12/17/2016   SINGLE FRAGMENT OF ATYPICAL EPITHELIAL CELLS  . BREAST EXCISIONAL BIOPSY Left 01/18/1986   Benign microcalcifications, [employer].  Marland Kitchen BREAST LUMPECTOMY Left 05/26/2017   T1c, N0; ER/ PR+, Her 2 neu negative, HIGH RISK by Mammoprint ;  Surgeon: [name], MD;  Location: [facility] ORS;  Service: General;  Laterality: Left;  . BREAST SURGERY  1988  . CARDIAC CATHETERIZATION  1989  . CATARACT EXTRACTION  1997 and 1998  . COLONOSCOPY WITH PROPOFOL N/A 09/12/2015   Procedure: COLONOSCOPY WITH PROPOFOL;  Surgeon: [name], MD;  Location: [facility] ENDOSCOPY;  Service: Endoscopy;  Laterality: N/A;  . KIDNEY STONE SURGERY  2018  . pace maker  2008  . [place]  . TONSILLECTOMY AND ADENOIDECTOMY  1950"s    FAMILY HISTORY: Family History  Problem Relation Age of Onset  . Stroke Mother   . Diabetes Mother   . Cancer Maternal Aunt        Breast Cancer  . Breast  cancer Maternal Aunt   . Arthritis Maternal Grandmother   . Cancer Maternal Aunt        Lung Cancer  . Diabetes Son   . Cancer Son   . Kidney disease Neg Hx   . Bladder Cancer Neg Hx     ADVANCED DIRECTIVES (Y/N):  N  HEALTH MAINTENANCE: Social History   Tobacco Use  . Smoking status: Former Smoker    Years: 5.00    Last attempt to quit: 09/14/1968    Years since quitting: 48.9  . Smokeless tobacco: Never Used  . Tobacco comment: quit 1970  Substance Use Topics  . Alcohol use: No    Alcohol/week: 0.0  oz  . Drug use: No     Colonoscopy:  PAP:  Bone density:  Lipid panel:  Allergies  Allergen Reactions  . Lyrica [Pregabalin] Swelling and Other (See Comments)    Other reaction(s): SWELLING/EDEMA Sleepy, does not eat  . Neurontin [Gabapentin] Nausea And Vomiting and Other (See Comments)    disoriented  . Bactrim [Sulfamethoxazole-Trimethoprim]     Made her dizziness    Current Outpatient Medications  Medication Sig Dispense Refill  . aspirin 81 MG tablet Take 81 mg by mouth daily.    . Biotin w/ Vitamins C & E (HAIR/SKIN/NAILS PO) Take 1 tablet by mouth daily.    . blood glucose meter kit and supplies Per patient please dispense One touch Ultra2 meter.Use meter and supplies three times a day to check blood sugar. ICD10: E11.40 1 each 0  . Cholecalciferol (D3-50 PO) Take 1 tablet by mouth daily.    . furosemide (LASIX) 20 MG tablet Take 20 mg by mouth daily.     Marland Kitchen glucose blood (ONE TOUCH ULTRA TEST) test strip USE ONE STRIP TO CHECK GLUCOSE THREE TIMES DAILY 100 each 2  . Lactobacillus (PROBIOTIC ACIDOPHILUS PO) Take 1 tablet by mouth daily.     Marland Kitchen MAGNESIUM SULFATE PO Take 1 tablet by mouth daily.     . metFORMIN (GLUCOPHAGE-XR) 500 MG 24 hr tablet TAKE 2 TABLETS BY MOUTH TWICE DAILY 120 tablet 2  . metoprolol (LOPRESSOR) 50 MG tablet Take 50 mg by mouth daily.     . Multiple Vitamins-Minerals (PRESERVISION AREDS 2 PO) Take 1 tablet by mouth daily.     Vladimir Faster Glycol-Propyl Glycol (SYSTANE OP) Apply 1 drop to eye 2 (two) times daily.    . vitamin B-12 (CYANOCOBALAMIN) 1000 MCG tablet Take 1,000 mcg by mouth daily.     No current facility-administered medications for this visit.     OBJECTIVE: Vitals:   08/24/17 1212  BP: 121/78  Pulse: 82  Resp: 18  Temp: (!) 97.3 F (36.3 C)     Body mass index is 30 kg/m.    ECOG FS:0 - Asymptomatic  General: Well-developed, well-nourished, no acute distress. Eyes: Pink conjunctiva, anicteric sclera. Breasts: Left breast with  well-healed surgical scar. Chest wall: Pacemaker in place just above surgical scar. Lungs: Clear to auscultation bilaterally. Heart: Regular rate and rhythm. No rubs, murmurs, or gallops. Abdomen: Soft, nontender, nondistended. No organomegaly noted, normoactive bowel sounds. Musculoskeletal: No edema, cyanosis, or clubbing. Neuro: Alert, answering all questions appropriately. Cranial nerves grossly intact. Skin: No rashes or petechiae noted. Psych: Normal affect.   LAB RESULTS:  Lab Results  Component Value Date   NA 135 08/20/2016   K 4.3 08/20/2016   CL 97 08/20/2016   CO2 22 08/20/2016   GLUCOSE 244 (H) 08/20/2016   BUN 10  08/20/2016   CREATININE 0.63 08/20/2016   CALCIUM 10.5 (H) 08/20/2016   PROT 7.6 08/20/2016   ALBUMIN 3.8 08/20/2016   AST 20 08/20/2016   ALT 21 08/20/2016   ALKPHOS 68 08/20/2016   BILITOT 0.5 08/20/2016   GFRNONAA 83 08/20/2016   GFRAA 95 08/20/2016    Lab Results  Component Value Date   WBC 6.2 08/03/2017   NEUTROABS 3.4 08/20/2016   HGB 12.0 08/03/2017   HCT 36.7 08/03/2017   MCV 85.9 08/03/2017   PLT 190 08/03/2017     STUDIES: No results found.  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  PLAN:    1.  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 She has now completed adjuvant XRT.  Given her osteoporosis on recent bone mineral density, will initiate tamoxifen daily for 5 years.  Return to clinic in 3 months for further evaluation. 2.  Osteoporosis: Bone mineral density from June 30, 2017 revealed a T score of -2.5.  Tamoxifen as above.  Patient was also instructed to  continue calcium and vitamin D supplementation.  Consider adding Fosamax in the future.    Approximately 30 minutes was spent in discussion of which greater than 50% was consultation.  Patient expressed understanding and was in agreement with this plan. She also understands that She can call clinic at any time with any questions, concerns, or complaints.   Cancer Staging Malignant neoplasm of upper-outer quadrant of left breast in female, estrogen receptor positive ([place]) Staging form: Breast, AJCC 8th Edition - Pathologic stage from 06/27/2017: Stage IA (pT1c, pN0, cM0, G1, ER: Positive, PR: Positive, HER2: Negative) - Signed by [name], MD on 06/27/2017   [name], MD   08/24/2017 12:38 PM

## 2017-08-25 ENCOUNTER — Ambulatory Visit: Payer: Medicare Other

## 2017-08-26 ENCOUNTER — Ambulatory Visit: Payer: Medicare Other

## 2017-08-27 ENCOUNTER — Ambulatory Visit: Payer: Medicare Other

## 2017-08-30 ENCOUNTER — Ambulatory Visit: Payer: Medicare Other

## 2017-08-31 ENCOUNTER — Ambulatory Visit: Payer: Medicare Other

## 2017-08-31 ENCOUNTER — Inpatient Hospital Stay: Payer: Medicare Other

## 2017-08-31 DIAGNOSIS — H353221 Exudative age-related macular degeneration, left eye, with active choroidal neovascularization: Secondary | ICD-10-CM | POA: Diagnosis not present

## 2017-09-01 ENCOUNTER — Ambulatory Visit: Payer: Medicare Other

## 2017-09-02 ENCOUNTER — Ambulatory Visit: Payer: Medicare Other

## 2017-09-03 ENCOUNTER — Ambulatory Visit: Payer: Medicare Other

## 2017-09-06 ENCOUNTER — Ambulatory Visit: Payer: Medicare Other

## 2017-09-08 ENCOUNTER — Ambulatory Visit: Payer: Medicare Other

## 2017-09-09 ENCOUNTER — Ambulatory Visit: Payer: Medicare Other

## 2017-09-14 HISTORY — PX: MOHS SURGERY: SUR867

## 2017-09-21 DIAGNOSIS — I495 Sick sinus syndrome: Secondary | ICD-10-CM | POA: Diagnosis not present

## 2017-09-28 DIAGNOSIS — H353211 Exudative age-related macular degeneration, right eye, with active choroidal neovascularization: Secondary | ICD-10-CM | POA: Diagnosis not present

## 2017-09-29 ENCOUNTER — Other Ambulatory Visit: Payer: Self-pay

## 2017-09-29 ENCOUNTER — Ambulatory Visit
Admission: RE | Admit: 2017-09-29 | Discharge: 2017-09-29 | Disposition: A | Discharge status: Home / Self Care | Payer: Medicare Other | Source: Ambulatory Visit | Attending: Radiation Oncology | Admitting: Radiation Oncology

## 2017-09-29 VITALS — BP 143/82 | HR 86 | Temp 97.4°F | Resp 20 | Wt 164.9 lb

## 2017-09-29 DIAGNOSIS — Z923 Personal history of irradiation: Secondary | ICD-10-CM | POA: Insufficient documentation

## 2017-09-29 DIAGNOSIS — M25512 Pain in left shoulder: Secondary | ICD-10-CM | POA: Diagnosis not present

## 2017-09-29 DIAGNOSIS — C50412 Malignant neoplasm of upper-outer quadrant of left female breast: Secondary | ICD-10-CM | POA: Insufficient documentation

## 2017-09-29 DIAGNOSIS — Z170 Estrogen receptor positive status [ER+]: Secondary | ICD-10-CM | POA: Insufficient documentation

## 2017-09-29 DIAGNOSIS — Z79810 Long term (current) use of selective estrogen receptor modulators (SERMs): Secondary | ICD-10-CM | POA: Diagnosis not present

## 2017-09-29 NOTE — Progress Notes (Signed)
Radiation Oncology Follow up Note  Name: Holly Rose   Date:   09/29/2017 MRN:  [id number] DOB: 07/29/1930    This 84 y.o. female presents to the clinic today for one-month follow-up status post whole breast radiation to her left breast for stage I invasive mammary carcinoma ER/PR positive.  REFERRING PROVIDER: [name], MD  HPI: Patient is a 84 year old female now seen out 1 month status post whole breast radiation to her left breast for stage I ER/PR positive HER-2/neu negative invasive mammary carcinoma seen today in routine follow-up she is doing well. She still complaining some pain in her left shoulder and continues to favor it. She did have a slight accident on the machine where he [employer] beam inadvertently hit her shoulder. She otherwise specifically denies breast tenderness cough or bone pain.. She's currently on tamoxifen tolerating that well without side effect.  COMPLICATIONS OF TREATMENT: none  FOLLOW UP COMPLIANCE: keeps appointments   PHYSICAL EXAM:  BP (!) 143/82   Pulse 86   Temp (!) 97.4 F (36.3 C)   Resp 20   Wt 164 lb 14.5 oz (74.8 kg)   LMP  (LMP Unknown)   BMI 30.35 kg/m  Lungs are clear to A&P cardiac examination essentially unremarkable with regular rate and rhythm. No dominant mass or nodularity is noted in either breast in 2 positions examined. Incision is well-healed. No axillary or supraclavicular adenopathy is appreciated. Cosmetic result is excellent. Range of motion of her left upper extremity does not elicit pain deep palpation of left upper extremity does not elicit pain. Motor and sensory levels are equal and symmetric in the upper and lower extremities. Well-developed well-nourished patient in NAD. HEENT reveals PERLA, EOMI, discs not visualized.  Oral cavity is clear. No oral mucosal lesions are identified. Neck is clear without evidence of cervical or supraclavicular adenopathy. Lungs are clear to A&P. Cardiac examination is essentially  unremarkable with regular rate and rhythm without murmur rub or thrill. Abdomen is benign with no organomegaly or masses noted. Motor sensory and DTR levels are equal and symmetric in the upper and lower extremities. Cranial nerves II through XII are grossly intact. Proprioception is intact. No peripheral adenopathy or edema is identified. No motor or sensory levels are noted. Crude visual fields are within normal range.  RADIOLOGY RESULTS: No current films for review  PLAN: Present time patient is doing well. I've emphasized she needs to use her left upper extremity is much is possible. Patient may benefit from physical therapy should this area of pain persist. Otherwise I've asked to see her back in 4-5 months for follow-up. She continues on tamoxifen without side effect. Patient family know to call with any concerns.  I would like to take this opportunity to thank you for allowing me to participate in the care of your patient.Noreene Filbert, MD

## 2017-10-15 DIAGNOSIS — C50912 Malignant neoplasm of unspecified site of left female breast: Secondary | ICD-10-CM

## 2017-10-15 HISTORY — DX: Malignant neoplasm of unspecified site of left female breast: C50.912

## 2017-10-18 DIAGNOSIS — I481 Persistent atrial fibrillation: Secondary | ICD-10-CM | POA: Diagnosis not present

## 2017-10-18 DIAGNOSIS — I4891 Unspecified atrial fibrillation: Secondary | ICD-10-CM | POA: Diagnosis not present

## 2017-10-18 DIAGNOSIS — E1169 Type 2 diabetes mellitus with other specified complication: Secondary | ICD-10-CM | POA: Diagnosis not present

## 2017-10-18 DIAGNOSIS — E785 Hyperlipidemia, unspecified: Secondary | ICD-10-CM | POA: Diagnosis not present

## 2017-10-18 DIAGNOSIS — I341 Nonrheumatic mitral (valve) prolapse: Secondary | ICD-10-CM | POA: Diagnosis not present

## 2017-10-19 DIAGNOSIS — H353221 Exudative age-related macular degeneration, left eye, with active choroidal neovascularization: Secondary | ICD-10-CM | POA: Diagnosis not present

## 2017-10-23 ENCOUNTER — Other Ambulatory Visit: Payer: Self-pay | Admitting: Internal Medicine

## 2017-10-27 ENCOUNTER — Ambulatory Visit: Payer: Medicare Other | Admitting: Podiatry

## 2017-11-03 ENCOUNTER — Ambulatory Visit (INDEPENDENT_AMBULATORY_CARE_PROVIDER_SITE_OTHER): Payer: Medicare Other | Admitting: Podiatry

## 2017-11-03 ENCOUNTER — Encounter: Payer: Self-pay | Admitting: Podiatry

## 2017-11-03 DIAGNOSIS — Q828 Other specified congenital malformations of skin: Secondary | ICD-10-CM

## 2017-11-03 DIAGNOSIS — E1142 Type 2 diabetes mellitus with diabetic polyneuropathy: Secondary | ICD-10-CM

## 2017-11-03 NOTE — Progress Notes (Signed)
She presents today chief complaint of painful calluses plantar aspect of the bilateral foot.  Objective: Vital signs are stable alert and oriented x3.  Pulses are palpable.  No open lesions or wounds.  Multiple reactive hyperkeratosis to the distal aspect of the toes and plantar aspect of the forefoot.  Assessment: Painful calluses and tylomas bilateral.  Plan: Debridement of reactive hyperkeratosis bilateral.  Follow-up with her as needed.

## 2017-11-04 DIAGNOSIS — D0439 Carcinoma in situ of skin of other parts of face: Secondary | ICD-10-CM | POA: Diagnosis not present

## 2017-11-11 ENCOUNTER — Ambulatory Visit: Payer: BLUE CROSS/BLUE SHIELD | Admitting: Internal Medicine

## 2017-11-12 DIAGNOSIS — Z923 Personal history of irradiation: Secondary | ICD-10-CM

## 2017-11-12 HISTORY — DX: Personal history of irradiation: Z92.3

## 2017-11-16 ENCOUNTER — Ambulatory Visit: Payer: BLUE CROSS/BLUE SHIELD | Admitting: Internal Medicine

## 2017-11-16 DIAGNOSIS — H353211 Exudative age-related macular degeneration, right eye, with active choroidal neovascularization: Secondary | ICD-10-CM | POA: Diagnosis not present

## 2017-11-24 ENCOUNTER — Ambulatory Visit: Payer: BLUE CROSS/BLUE SHIELD | Admitting: Oncology

## 2017-11-28 NOTE — Progress Notes (Deleted)
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  INTERVAL HISTORY: Patient returns to clinic today for further evaluation and initiation of tamoxifen.  She will complete her adjuvant XRT today. She currently feels well and is asymptomatic. She has no neurologic complaints. She denies any recent fevers or illnesses. She has a good appetite and denies weight loss. She has no chest pain or shortness of breath. She denies any nausea, vomiting, constipation, or diarrhea. She has no urinary complaints. Patient feels at her baseline and offers no specific complaints today.  REVIEW OF SYSTEMS:   Review of Systems  Constitutional: Negative.  Negative for fever, malaise/fatigue and weight loss.  Respiratory: Negative.  Negative for cough and shortness of breath.   Cardiovascular: Negative.  Negative for chest pain and leg swelling.  Gastrointestinal: Negative.  Negative for abdominal pain, blood in stool and melena.  Genitourinary: Negative.   Musculoskeletal: Negative.   Skin: Negative.  Negative for rash.  Neurological: Negative.  Negative for weakness.  Psychiatric/Behavioral: Negative.  The patient is not nervous/anxious.     As per HPI. Otherwise, a complete review of systems is negative.  PAST MEDICAL HISTORY: Past Medical History:  Diagnosis Date  . Allergy   . Anemia   . Arthritis   . Atrial fibrillation ([place]) 2004  . CAD (coronary artery disease)   . Cancer ([place])    H/O skin cancer, squamous  . Complication of anesthesia    hard to wake up with general anesthesia  . Diabetes ([place])   .  Dysrhythmia    A-fib  . Heart attack ([place]) 2008  . Heart murmur   . History of kidney stones   . History of sick sinus syndrome    s/p pacemaker placement  . Hyperlipidemia   . Macular degeneration   . Neuropathy   . Neuropathy   . Pacemaker 06/2010  . TIA (transient ischemic attack)     PAST SURGICAL HISTORY: Past Surgical History:  Procedure Laterality Date  . ABDOMINAL HYSTERECTOMY  1960's  . APPENDECTOMY  1947  . [place]  . BREAST BIOPSY Left 12/17/2016   SINGLE FRAGMENT OF ATYPICAL EPITHELIAL CELLS  . BREAST EXCISIONAL BIOPSY Left 01/18/1986   Benign microcalcifications, [employer].  Marland Kitchen BREAST LUMPECTOMY Left 05/26/2017   T1c, N0; ER/ PR+, Her 2 neu negative, HIGH RISK by Mammoprint ;  Surgeon: [name], MD;  Location: [facility] ORS;  Service: General;  Laterality: Left;  . BREAST SURGERY  1988  . CARDIAC CATHETERIZATION  1989  . CATARACT EXTRACTION  1997 and 1998  . COLONOSCOPY WITH PROPOFOL N/A 09/12/2015   Procedure: COLONOSCOPY WITH PROPOFOL;  Surgeon: [name], MD;  Location: [facility] ENDOSCOPY;  Service: Endoscopy;  Laterality: N/A;  . KIDNEY STONE SURGERY  2018  . pace maker  2008  . [place]  . TONSILLECTOMY AND ADENOIDECTOMY  1950"s    FAMILY HISTORY: Family History  Problem Relation Age of Onset  . Stroke Mother   . Diabetes Mother   . Cancer Maternal Aunt        Breast Cancer  . Breast  cancer Maternal Aunt   . Arthritis Maternal Grandmother   . Cancer Maternal Aunt        Lung Cancer  . Diabetes Son   . Cancer Son   . Kidney disease Neg Hx   . Bladder Cancer Neg Hx     ADVANCED DIRECTIVES (Y/N):  N  HEALTH MAINTENANCE: Social History   Tobacco Use  . Smoking status: Former Smoker    Years: 5.00    Last attempt to quit: 09/14/1968    Years since quitting: 49.2  . Smokeless tobacco: Never Used  . Tobacco comment: quit 1970  Substance Use Topics  . Alcohol use: No    Alcohol/week: 0.0  oz  . Drug use: No     Colonoscopy:  PAP:  Bone density:  Lipid panel:  Allergies  Allergen Reactions  . Lyrica [Pregabalin] Swelling and Other (See Comments)    Other reaction(s): SWELLING/EDEMA Sleepy, does not eat  . Neurontin [Gabapentin] Nausea And Vomiting and Other (See Comments)    disoriented  . Bactrim [Sulfamethoxazole-Trimethoprim]     Made her dizziness    Current Outpatient Medications  Medication Sig Dispense Refill  . Aflibercept (EYLEA) 2 MG/0.05ML SOLN by Intravitreal route.    Marland Kitchen aspirin 81 MG tablet Take 81 mg by mouth daily.    . Biotin w/ Vitamins C & E (HAIR/SKIN/NAILS PO) Take 1 tablet by mouth daily.    . blood glucose meter kit and supplies Per patient please dispense One touch Ultra2 meter.Use meter and supplies three times a day to check blood sugar. ICD10: E11.40 1 each 0  . Cholecalciferol (D3-50 PO) Take 1 tablet by mouth daily.    . furosemide (LASIX) 20 MG tablet Take 20 mg by mouth daily.     Marland Kitchen glucose blood (ONE TOUCH ULTRA TEST) test strip USE ONE STRIP TO CHECK GLUCOSE THREE TIMES DAILY 100 each 2  . Lactobacillus (PROBIOTIC ACIDOPHILUS PO) Take 1 tablet by mouth daily.     Marland Kitchen MAGNESIUM SULFATE PO Take 1 tablet by mouth daily.     . metFORMIN (GLUCOPHAGE-XR) 500 MG 24 hr tablet TAKE 2 TABLETS BY MOUTH TWICE DAILY 120 tablet 2  . metoprolol (LOPRESSOR) 50 MG tablet Take 50 mg by mouth daily.     . Multiple Vitamins-Minerals (PRESERVISION AREDS 2 PO) Take 1 tablet by mouth daily.     Vladimir Faster Glycol-Propyl Glycol (SYSTANE OP) Apply 1 drop to eye 2 (two) times daily.    . tamoxifen (NOLVADEX) 20 MG tablet Take 1 tablet (20 mg total) by mouth daily. 30 tablet 11  . vitamin B-12 (CYANOCOBALAMIN) 1000 MCG tablet Take 1,000 mcg by mouth daily.     No current facility-administered medications for this visit.     OBJECTIVE: There were no vitals filed for this visit.   There is no height or weight on file to calculate BMI.    ECOG FS:0 -  Asymptomatic  General: Well-developed, well-nourished, no acute distress. Eyes: Pink conjunctiva, anicteric sclera. Breasts: Left breast with well-healed surgical scar. Chest wall: Pacemaker in place just above surgical scar. Lungs: Clear to auscultation bilaterally. Heart: Regular rate and rhythm. No rubs, murmurs, or gallops. Abdomen: Soft, nontender, nondistended. No organomegaly noted, normoactive bowel sounds. Musculoskeletal: No edema, cyanosis, or clubbing. Neuro: Alert, answering all questions appropriately. Cranial nerves grossly intact. Skin: No rashes or petechiae noted. Psych: Normal affect.   LAB RESULTS:  Lab Results  Component Value Date   NA 135 08/20/2016   K 4.3  08/20/2016   CL 97 08/20/2016   CO2 22 08/20/2016   GLUCOSE 244 (H) 08/20/2016   BUN 10 08/20/2016   CREATININE 0.63 08/20/2016   CALCIUM 10.5 (H) 08/20/2016   PROT 7.6 08/20/2016   ALBUMIN 3.8 08/20/2016   AST 20 08/20/2016   ALT 21 08/20/2016   ALKPHOS 68 08/20/2016   BILITOT 0.5 08/20/2016   GFRNONAA 83 08/20/2016   GFRAA 95 08/20/2016    Lab Results  Component Value Date   WBC 6.2 08/03/2017   NEUTROABS 3.4 08/20/2016   HGB 12.0 08/03/2017   HCT 36.7 08/03/2017   MCV 85.9 08/03/2017   PLT 190 08/03/2017     STUDIES: No results found.  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  PLAN:    1.  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 She has now completed adjuvant XRT.  Given her osteoporosis on recent bone mineral density, will initiate tamoxifen daily for 5 years.  Return to clinic in 3 months for further  evaluation. 2.  Osteoporosis: Bone mineral density from June 30, 2017 revealed a T score of -2.5.  Tamoxifen as above.  Patient was also instructed to continue calcium and vitamin D supplementation.  Consider adding Fosamax in the future.    Approximately 30 minutes was spent in discussion of which greater than 50% was consultation.  Patient expressed understanding and was in agreement with this plan. She also understands that She can call clinic at any time with any questions, concerns, or complaints.   Cancer Staging Malignant neoplasm of upper-outer quadrant of left breast in female, estrogen receptor positive ([place]) Staging form: Breast, AJCC 8th Edition - Pathologic stage from 06/27/2017: Stage IA (pT1c, pN0, cM0, G1, ER: Positive, PR: Positive, HER2: Negative) - Signed by [name], MD on 06/27/2017   [name], MD   11/28/2017 10:22 AM

## 2017-12-02 ENCOUNTER — Inpatient Hospital Stay: Payer: Medicare Other | Admitting: Oncology

## 2017-12-06 DIAGNOSIS — H353221 Exudative age-related macular degeneration, left eye, with active choroidal neovascularization: Secondary | ICD-10-CM | POA: Diagnosis not present

## 2017-12-09 DIAGNOSIS — E1142 Type 2 diabetes mellitus with diabetic polyneuropathy: Secondary | ICD-10-CM | POA: Diagnosis not present

## 2017-12-09 DIAGNOSIS — E559 Vitamin D deficiency, unspecified: Secondary | ICD-10-CM | POA: Diagnosis not present

## 2017-12-09 DIAGNOSIS — E1169 Type 2 diabetes mellitus with other specified complication: Secondary | ICD-10-CM | POA: Diagnosis not present

## 2017-12-09 DIAGNOSIS — E785 Hyperlipidemia, unspecified: Secondary | ICD-10-CM | POA: Diagnosis not present

## 2017-12-22 DIAGNOSIS — E1169 Type 2 diabetes mellitus with other specified complication: Secondary | ICD-10-CM | POA: Diagnosis not present

## 2017-12-22 DIAGNOSIS — E1142 Type 2 diabetes mellitus with diabetic polyneuropathy: Secondary | ICD-10-CM | POA: Diagnosis not present

## 2017-12-22 DIAGNOSIS — E785 Hyperlipidemia, unspecified: Secondary | ICD-10-CM | POA: Diagnosis not present

## 2017-12-26 NOTE — Progress Notes (Signed)
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  INTERVAL HISTORY: Patient returns to clinic today for routine evaluation.  She is tolerating tamoxifen well without significant side effects.  She currently feels well and is asymptomatic. She has no neurologic complaints. She denies any recent fevers or illnesses. She has a good appetite and denies weight loss. She has no chest pain or shortness of breath. She denies any nausea, vomiting, constipation, or diarrhea. She has no urinary complaints.  Patient offers no specific complaints today.  REVIEW OF SYSTEMS:   Review of Systems  Constitutional: Negative.  Negative for fever, malaise/fatigue and weight loss.  Respiratory: Negative.  Negative for cough and shortness of breath.   Cardiovascular: Negative.  Negative for chest pain and leg swelling.  Gastrointestinal: Negative.  Negative for abdominal pain, blood in stool and melena.  Genitourinary: Negative.  Negative for dysuria.  Musculoskeletal: Negative.   Skin: Negative.  Negative for rash.  Neurological: Negative.  Negative for sensory change, focal weakness and weakness.  Psychiatric/Behavioral: Negative.  The patient is not nervous/anxious.     As per HPI. Otherwise, a complete review of systems is negative.  PAST MEDICAL HISTORY: Past Medical History:  Diagnosis Date  . Allergy   . Anemia   . Arthritis   . Atrial fibrillation ([place]) 2004  . CAD (coronary artery disease)   . Cancer ([place])    H/O skin cancer, squamous  . Complication of anesthesia    hard to wake up with general  anesthesia  . Diabetes ([place])   . Dysrhythmia    A-fib  . Heart attack ([place]) 2008  . Heart murmur   . History of kidney stones   . History of sick sinus syndrome    s/p pacemaker placement  . Hyperlipidemia   . Macular degeneration   . Neuropathy   . Neuropathy   . Pacemaker 06/2010  . TIA (transient ischemic attack)     PAST SURGICAL HISTORY: Past Surgical History:  Procedure Laterality Date  . ABDOMINAL HYSTERECTOMY  1960's  . APPENDECTOMY  1947  . [place]  . BREAST BIOPSY Left 12/17/2016   SINGLE FRAGMENT OF ATYPICAL EPITHELIAL CELLS  . BREAST EXCISIONAL BIOPSY Left 01/18/1986   Benign microcalcifications, [employer].  Marland Kitchen BREAST LUMPECTOMY Left 05/26/2017   T1c, N0; ER/ PR+, Her 2 neu negative, HIGH RISK by Mammoprint ;  Surgeon: [name], MD;  Location: [facility] ORS;  Service: General;  Laterality: Left;  . BREAST SURGERY  1988  . CARDIAC CATHETERIZATION  1989  . CATARACT EXTRACTION  1997 and 1998  . COLONOSCOPY WITH PROPOFOL N/A 09/12/2015   Procedure: COLONOSCOPY WITH PROPOFOL;  Surgeon: [name], MD;  Location: [facility] ENDOSCOPY;  Service: Endoscopy;  Laterality: N/A;  . KIDNEY STONE SURGERY  2018  . pace maker  2008  . [place]  . TONSILLECTOMY AND ADENOIDECTOMY  1950"s    FAMILY HISTORY: Family History  Problem Relation Age of Onset  . Stroke Mother   . Diabetes Mother   . Cancer Maternal Aunt        Breast Cancer  .  Breast cancer Maternal Aunt   . Arthritis Maternal Grandmother   . Cancer Maternal Aunt        Lung Cancer  . Diabetes Son   . Cancer Son   . Kidney disease Neg Hx   . Bladder Cancer Neg Hx     ADVANCED DIRECTIVES (Y/N):  N  HEALTH MAINTENANCE: Social History   Tobacco Use  . Smoking status: Former Smoker    Years: 5.00    Last attempt to quit: 09/14/1968    Years since quitting: 49.3  . Smokeless tobacco: Never Used  . Tobacco comment: quit 1970  Substance Use Topics  .  Alcohol use: No    Alcohol/week: 0.0 oz  . Drug use: No     Colonoscopy:  PAP:  Bone density:  Lipid panel:  Allergies  Allergen Reactions  . Lyrica [Pregabalin] Swelling and Other (See Comments)    Other reaction(s): SWELLING/EDEMA Sleepy, does not eat  . Neurontin [Gabapentin] Nausea And Vomiting and Other (See Comments)    disoriented  . Bactrim [Sulfamethoxazole-Trimethoprim]     Made her dizziness    Current Outpatient Medications  Medication Sig Dispense Refill  . Aflibercept (EYLEA) 2 MG/0.05ML SOLN by Intravitreal route.    Marland Kitchen aspirin 81 MG tablet Take 81 mg by mouth daily.    . Biotin w/ Vitamins C & E (HAIR/SKIN/NAILS PO) Take 1 tablet by mouth daily.    . blood glucose meter kit and supplies Per patient please dispense One touch Ultra2 meter.Use meter and supplies three times a day to check blood sugar. ICD10: E11.40 1 each 0  . Cholecalciferol (D3-50 PO) Take 1 tablet by mouth daily.    . furosemide (LASIX) 20 MG tablet Take 20 mg by mouth daily.     Marland Kitchen glucose blood (ONE TOUCH ULTRA TEST) test strip USE ONE STRIP TO CHECK GLUCOSE THREE TIMES DAILY 100 each 2  . Lactobacillus (PROBIOTIC ACIDOPHILUS PO) Take 1 tablet by mouth daily.     Marland Kitchen MAGNESIUM SULFATE PO Take 1 tablet by mouth daily.     . metFORMIN (GLUCOPHAGE-XR) 500 MG 24 hr tablet TAKE 2 TABLETS BY MOUTH TWICE DAILY 120 tablet 2  . metoprolol (LOPRESSOR) 50 MG tablet Take 50 mg by mouth daily.     . Multiple Vitamins-Minerals (PRESERVISION AREDS 2 PO) Take 1 tablet by mouth daily.     Vladimir Faster Glycol-Propyl Glycol (SYSTANE OP) Apply 1 drop to eye 2 (two) times daily.    . tamoxifen (NOLVADEX) 20 MG tablet Take 1 tablet (20 mg total) by mouth daily. 30 tablet 11  . vitamin B-12 (CYANOCOBALAMIN) 1000 MCG tablet Take 1,000 mcg by mouth daily.     No current facility-administered medications for this visit.     OBJECTIVE: Vitals:   12/27/17 1053 12/27/17 1057  BP:  (!) 162/116  Pulse:  89  Resp: 12     Temp:  98 F (36.7 C)     Body mass index is 30.35 kg/m.    ECOG FS:0 - Asymptomatic  General: Well-developed, well-nourished, no acute distress. Eyes: Pink conjunctiva, anicteric sclera. Breast: Left breast with well-healed surgical scar.  Patient declined exam today. Lungs: Clear to auscultation bilaterally. Heart: Regular rate and rhythm. No rubs, murmurs, or gallops. Abdomen: Soft, nontender, nondistended. No organomegaly noted, normoactive bowel sounds. Musculoskeletal: No edema, cyanosis, or clubbing. Neuro: Alert, answering all questions appropriately. Cranial nerves grossly intact. Skin: No rashes or petechiae noted. Psych: Normal affect.  LAB RESULTS:  Lab Results  Component Value Date   NA 135 08/20/2016   K 4.3 08/20/2016   CL 97 08/20/2016   CO2 22 08/20/2016   GLUCOSE 244 (H) 08/20/2016   BUN 10 08/20/2016   CREATININE 0.63 08/20/2016   CALCIUM 10.5 (H) 08/20/2016   PROT 7.6 08/20/2016   ALBUMIN 3.8 08/20/2016   AST 20 08/20/2016   ALT 21 08/20/2016   ALKPHOS 68 08/20/2016   BILITOT 0.5 08/20/2016   GFRNONAA 83 08/20/2016   GFRAA 95 08/20/2016    Lab Results  Component Value Date   WBC 6.2 08/03/2017   NEUTROABS 3.4 08/20/2016   HGB 12.0 08/03/2017   HCT 36.7 08/03/2017   MCV 85.9 08/03/2017   PLT 190 08/03/2017     STUDIES: No results found.  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  PLAN:    1.  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  She has now completed XRT.  Patient will require mammogram in the next 1-2 weeks.  Continue tamoxifen daily for a total of 5 years completing in January 2024.  Return to  clinic in 6 months for further evaluation. 2.  Osteoporosis: Bone mineral density from June 30, 2017 revealed a T score of -2.5.  Continue tamoxifen as above.  Patient has been instructed to continue her calcium and vitamin D supplementation.  Repeat bone mineral density in October 2019.  If no improvement, consider adding Fosamax.   Approximately 20 minutes was spent in discussion of which greater than 50% was consultation.  Patient expressed understanding and was in agreement with this plan. She also understands that She can call clinic at any time with any questions, concerns, or complaints.   Cancer Staging Malignant neoplasm of upper-outer quadrant of left breast in female, estrogen receptor positive ([place]) Staging form: Breast, AJCC 8th Edition - Pathologic stage from 06/27/2017: Stage IA (pT1c, pN0, cM0, G1, ER: Positive, PR: Positive, HER2: Negative) - Signed by [name], MD on 06/27/2017   [name], MD   12/27/2017 3:35 PM

## 2017-12-27 ENCOUNTER — Inpatient Hospital Stay: Payer: Medicare Other | Attending: Oncology | Admitting: Oncology

## 2017-12-27 ENCOUNTER — Other Ambulatory Visit: Payer: Self-pay

## 2017-12-27 ENCOUNTER — Encounter: Payer: Self-pay | Admitting: Oncology

## 2017-12-27 VITALS — BP 162/116 | HR 89 | Temp 98.0°F | Resp 12 | Ht 61.81 in | Wt 164.9 lb

## 2017-12-27 DIAGNOSIS — E119 Type 2 diabetes mellitus without complications: Secondary | ICD-10-CM

## 2017-12-27 DIAGNOSIS — Z79899 Other long term (current) drug therapy: Secondary | ICD-10-CM

## 2017-12-27 DIAGNOSIS — M810 Age-related osteoporosis without current pathological fracture: Secondary | ICD-10-CM | POA: Diagnosis not present

## 2017-12-27 DIAGNOSIS — I252 Old myocardial infarction: Secondary | ICD-10-CM | POA: Diagnosis not present

## 2017-12-27 DIAGNOSIS — Z801 Family history of malignant neoplasm of trachea, bronchus and lung: Secondary | ICD-10-CM

## 2017-12-27 DIAGNOSIS — Z87442 Personal history of urinary calculi: Secondary | ICD-10-CM | POA: Diagnosis not present

## 2017-12-27 DIAGNOSIS — C50412 Malignant neoplasm of upper-outer quadrant of left female breast: Secondary | ICD-10-CM

## 2017-12-27 DIAGNOSIS — R011 Cardiac murmur, unspecified: Secondary | ICD-10-CM | POA: Diagnosis not present

## 2017-12-27 DIAGNOSIS — Z803 Family history of malignant neoplasm of breast: Secondary | ICD-10-CM | POA: Diagnosis not present

## 2017-12-27 DIAGNOSIS — Z7984 Long term (current) use of oral hypoglycemic drugs: Secondary | ICD-10-CM | POA: Diagnosis not present

## 2017-12-27 DIAGNOSIS — E785 Hyperlipidemia, unspecified: Secondary | ICD-10-CM | POA: Diagnosis not present

## 2017-12-27 DIAGNOSIS — D649 Anemia, unspecified: Secondary | ICD-10-CM

## 2017-12-27 DIAGNOSIS — Z8673 Personal history of transient ischemic attack (TIA), and cerebral infarction without residual deficits: Secondary | ICD-10-CM

## 2017-12-27 DIAGNOSIS — I4891 Unspecified atrial fibrillation: Secondary | ICD-10-CM | POA: Diagnosis not present

## 2017-12-27 DIAGNOSIS — M129 Arthropathy, unspecified: Secondary | ICD-10-CM

## 2017-12-27 DIAGNOSIS — Z7982 Long term (current) use of aspirin: Secondary | ICD-10-CM

## 2017-12-27 DIAGNOSIS — Z923 Personal history of irradiation: Secondary | ICD-10-CM

## 2017-12-27 DIAGNOSIS — H353211 Exudative age-related macular degeneration, right eye, with active choroidal neovascularization: Secondary | ICD-10-CM | POA: Diagnosis not present

## 2017-12-27 DIAGNOSIS — Z79810 Long term (current) use of selective estrogen receptor modulators (SERMs): Secondary | ICD-10-CM

## 2017-12-27 DIAGNOSIS — I2510 Atherosclerotic heart disease of native coronary artery without angina pectoris: Secondary | ICD-10-CM | POA: Diagnosis not present

## 2017-12-27 DIAGNOSIS — G629 Polyneuropathy, unspecified: Secondary | ICD-10-CM | POA: Diagnosis not present

## 2017-12-27 DIAGNOSIS — Z170 Estrogen receptor positive status [ER+]: Secondary | ICD-10-CM | POA: Diagnosis not present

## 2017-12-27 NOTE — Progress Notes (Signed)
Patient here for follow up. Her BP is elevated today. No Headache

## 2018-01-14 ENCOUNTER — Ambulatory Visit
Admission: RE | Admit: 2018-01-14 | Discharge: 2018-01-14 | Disposition: A | Discharge status: Home / Self Care | Payer: Medicare Other | Source: Ambulatory Visit | Attending: Oncology | Admitting: Oncology

## 2018-01-14 DIAGNOSIS — C50412 Malignant neoplasm of upper-outer quadrant of left female breast: Secondary | ICD-10-CM | POA: Diagnosis not present

## 2018-01-14 DIAGNOSIS — Z170 Estrogen receptor positive status [ER+]: Principal | ICD-10-CM

## 2018-01-14 DIAGNOSIS — R928 Other abnormal and inconclusive findings on diagnostic imaging of breast: Secondary | ICD-10-CM | POA: Diagnosis not present

## 2018-01-14 HISTORY — DX: Personal history of irradiation: Z92.3

## 2018-01-17 DIAGNOSIS — I341 Nonrheumatic mitral (valve) prolapse: Secondary | ICD-10-CM | POA: Diagnosis not present

## 2018-01-17 DIAGNOSIS — I481 Persistent atrial fibrillation: Secondary | ICD-10-CM | POA: Diagnosis not present

## 2018-01-17 DIAGNOSIS — E785 Hyperlipidemia, unspecified: Secondary | ICD-10-CM | POA: Diagnosis not present

## 2018-01-17 DIAGNOSIS — E1169 Type 2 diabetes mellitus with other specified complication: Secondary | ICD-10-CM | POA: Diagnosis not present

## 2018-01-17 DIAGNOSIS — I4891 Unspecified atrial fibrillation: Secondary | ICD-10-CM | POA: Diagnosis not present

## 2018-01-25 DIAGNOSIS — H353221 Exudative age-related macular degeneration, left eye, with active choroidal neovascularization: Secondary | ICD-10-CM | POA: Diagnosis not present

## 2018-01-25 DIAGNOSIS — H35371 Puckering of macula, right eye: Secondary | ICD-10-CM | POA: Diagnosis not present

## 2018-01-31 ENCOUNTER — Other Ambulatory Visit: Payer: Self-pay | Admitting: Internal Medicine

## 2018-02-02 ENCOUNTER — Ambulatory Visit (INDEPENDENT_AMBULATORY_CARE_PROVIDER_SITE_OTHER): Payer: Medicare Other | Admitting: Podiatry

## 2018-02-02 ENCOUNTER — Encounter: Payer: Self-pay | Admitting: Podiatry

## 2018-02-02 DIAGNOSIS — Q828 Other specified congenital malformations of skin: Secondary | ICD-10-CM

## 2018-02-02 DIAGNOSIS — M79609 Pain in unspecified limb: Secondary | ICD-10-CM

## 2018-02-02 DIAGNOSIS — B351 Tinea unguium: Secondary | ICD-10-CM | POA: Diagnosis not present

## 2018-02-02 DIAGNOSIS — E1142 Type 2 diabetes mellitus with diabetic polyneuropathy: Secondary | ICD-10-CM | POA: Diagnosis not present

## 2018-02-02 NOTE — Progress Notes (Signed)
She presents today chief complaint of painful elongated toenails and calluses bilaterally.  Objective: Vital signs are stable alert and oriented x3.  Pulses are palpable.  Toenails are long thick yellow dystrophic-like mycotic bilateral foot.  Assessment: Pain in limb secondary onychomycosis.  Plan: Debridement of reactive hyperkeratosis and toenails 1 through 5.  Bilateral

## 2018-02-04 ENCOUNTER — Ambulatory Visit (INDEPENDENT_AMBULATORY_CARE_PROVIDER_SITE_OTHER): Payer: Medicare Other | Admitting: Internal Medicine

## 2018-02-04 ENCOUNTER — Encounter: Payer: Self-pay | Admitting: Internal Medicine

## 2018-02-04 VITALS — BP 132/78 | HR 70 | Temp 97.9°F | Resp 18 | Wt 164.2 lb

## 2018-02-04 DIAGNOSIS — E1140 Type 2 diabetes mellitus with diabetic neuropathy, unspecified: Secondary | ICD-10-CM

## 2018-02-04 DIAGNOSIS — C50412 Malignant neoplasm of upper-outer quadrant of left female breast: Secondary | ICD-10-CM | POA: Diagnosis not present

## 2018-02-04 DIAGNOSIS — D509 Iron deficiency anemia, unspecified: Secondary | ICD-10-CM

## 2018-02-04 DIAGNOSIS — I4891 Unspecified atrial fibrillation: Secondary | ICD-10-CM | POA: Diagnosis not present

## 2018-02-04 DIAGNOSIS — E7800 Pure hypercholesterolemia, unspecified: Secondary | ICD-10-CM | POA: Diagnosis not present

## 2018-02-04 DIAGNOSIS — E559 Vitamin D deficiency, unspecified: Secondary | ICD-10-CM | POA: Diagnosis not present

## 2018-02-04 DIAGNOSIS — R350 Frequency of micturition: Secondary | ICD-10-CM | POA: Diagnosis not present

## 2018-02-04 DIAGNOSIS — R0981 Nasal congestion: Secondary | ICD-10-CM

## 2018-02-04 DIAGNOSIS — Z170 Estrogen receptor positive status [ER+]: Secondary | ICD-10-CM

## 2018-02-04 DIAGNOSIS — I2510 Atherosclerotic heart disease of native coronary artery without angina pectoris: Secondary | ICD-10-CM | POA: Diagnosis not present

## 2018-02-04 LAB — HM DIABETES FOOT EXAM

## 2018-02-04 NOTE — Patient Instructions (Addendum)
nasacort nasal spray - 2 sprays each nostril one time per day.  Do this in the evening.    Robitussin DM twice a day as needed.

## 2018-02-04 NOTE — Progress Notes (Signed)
Patient ID: Holly Rose, female   DOB: 17-Jan-1930, 84 y.o.   MRN: [id number]   Subjective:    Patient ID: Holly Rose, female    DOB: [birth year], 84 y.o.   MRN: [id number]  HPI  Patient here for a scheduled follow up.  She is accompanied by her daughter-n-law.  History obtained from both of them.  She reports she is doing relatively well.  Tries to stay active.  Increased stress.  Overall she feels she is handling things relatively well.  Eating.  States blood sugars are doing better.  AM sugars averaging 118-140-150.  Seeing Dr Honor Junes.  a1c just checked - 7.5.  Trying to watch her diet.  No chest pain.  Breathing stable.  No acid reflux.  Saw Dr Ubaldo Glassing 01/17/18.  Stable.  Saw Dr Grayland Ormond 12/27/17.  S/p XRT.  On tamoxifen.  No nausea or vomiting.  Bowels better.     Past Medical History:  Diagnosis Date  . Allergy   . Anemia   . Arthritis   . Atrial fibrillation ([place]) 2004  . CAD (coronary artery disease)   . Cancer ([place])    H/O skin cancer, squamous  . Complication of anesthesia    hard to wake up with general anesthesia  . Diabetes ([place])   . Dysrhythmia    A-fib  . Heart attack ([place]) 2008  . Heart murmur   . History of kidney stones   . History of sick sinus syndrome    s/p pacemaker placement  . Hyperlipidemia   . Macular degeneration   . Neuropathy   . Neuropathy   . Pacemaker 06/2010  . Personal history of radiation therapy 11/2017   LEFT lumpectomy   . TIA (transient ischemic attack)    Past Surgical History:  Procedure Laterality Date  . ABDOMINAL HYSTERECTOMY  1960's  . APPENDECTOMY  1947  . [place]  . BREAST BIOPSY Left 12/17/2016   SINGLE FRAGMENT OF ATYPICAL EPITHELIAL CELLS  . BREAST BIOPSY Left 04/08/2017  . BREAST EXCISIONAL BIOPSY Left 01/18/1986   Benign microcalcifications, [employer].  Marland Kitchen BREAST LUMPECTOMY Left 05/26/2017   T1c, N0; ER/ PR+, Her 2 neu negative, HIGH RISK by Mammoprint ;  Surgeon: [name], MD;  Location: [facility] ORS;  Service: General;  Laterality: Left;  . BREAST SURGERY  1988  . CARDIAC CATHETERIZATION  1989  . CATARACT EXTRACTION  1997 and 1998  . COLONOSCOPY WITH PROPOFOL N/A 09/12/2015   Procedure: COLONOSCOPY WITH PROPOFOL;  Surgeon: [name], MD;  [facility] END[facility]COPY;  Service: Endoscopy;  Laterality: N/A;  . KIDNEY STONE SURGERY  2018  . pace maker  2008  . [place]  . TONSILLECTOMY AND ADENOIDECTOMY  1950"s   Family History  Problem Relation Age of Onset  . Stroke Mother   . Diabetes Mother   . Cancer Maternal Aunt        Breast Cancer  . Breast cancer Maternal Aunt   . Arthritis Maternal Grandmother   . Cancer Maternal Aunt        Lung Cancer  . Diabetes Son   . Cancer Son   . Kidney disease Neg Hx   . Bladder Cancer Neg Hx    Social History   Socioeconomic History  . Marital status: Married    Spouse name: Not on file  . Number of children: Not on file  . Years of education: Not on file  .  Highest education level: Not on file  Occupational History  . Not on file  Social Needs  . Financial resource strain: Not on file  . Food insecurity:    Worry: Not on file    Inability: Not on file  . Transportation needs:    Medical: Not on file    Non-medical: Not on file  Tobacco Use  . Smoking status: Former Smoker    Years: 5.00    Last attempt to quit: 09/14/1968    Years since quitting: 49.4  . Smokeless tobacco: Never Used  . Tobacco comment: quit 1970  Substance and Sexual Activity  . Alcohol use: No    Alcohol/week: 0.0 oz  . Drug use: No  . Sexual activity: Not on file  Lifestyle  . Physical activity:    Days per week: Not on file    Minutes per session: Not on file  . Stress: Not on file  Relationships  . Social connections:    Talks on phone: Not on file    Gets together: Not on file    Attends religious service: Not on file    Active member of club or organization: Not on file    Attends meetings of  clubs or organizations: Not on file    Relationship status: Not on file  Other Topics Concern  . Not on file  Social History Narrative  . Not on file    Outpatient Encounter Medications as of 02/04/2018  Medication Sig  . Aflibercept (EYLEA) 2 MG/0.05ML SOLN by Intravitreal route.  Marland Kitchen aspirin 81 MG tablet Take 81 mg by mouth daily.  . Biotin w/ Vitamins C & E (HAIR/SKIN/NAILS PO) Take 1 tablet by mouth daily.  . blood glucose meter kit and supplies Per patient please dispense One touch Ultra2 meter.Use meter and supplies three times a day to check blood sugar. ICD10: E11.40  . Cholecalciferol (D3-50 PO) Take 1 tablet by mouth daily.  . furosemide (LASIX) 20 MG tablet Take 20 mg by mouth daily.   Marland Kitchen glucose blood (ONE TOUCH ULTRA TEST) test strip USE ONE STRIP TO CHECK GLUCOSE THREE TIMES DAILY  . Lactobacillus (PROBIOTIC ACIDOPHILUS PO) Take 1 tablet by mouth daily.   Marland Kitchen MAGNESIUM SULFATE PO Take 1 tablet by mouth daily.   . metFORMIN (GLUCOPHAGE-XR) 500 MG 24 hr tablet TAKE 2 TABLETS BY MOUTH TWICE DAILY  . metoprolol (LOPRESSOR) 50 MG tablet Take 50 mg by mouth daily.   . Multiple Vitamins-Minerals (PRESERVISION AREDS 2 PO) Take 1 tablet by mouth daily.   Vladimir Faster Glycol-Propyl Glycol (SYSTANE OP) Apply 1 drop to eye 2 (two) times daily.  . tamoxifen (NOLVADEX) 20 MG tablet Take 1 tablet (20 mg total) by mouth daily.  . vitamin B-12 (CYANOCOBALAMIN) 1000 MCG tablet Take 1,000 mcg by mouth daily.   No facility-administered encounter medications on file as of 02/04/2018.     Review of Systems  Constitutional: Negative for appetite change and unexpected weight change.  HENT: Negative for congestion and sinus pressure.   Respiratory: Negative for cough, chest tightness and shortness of breath.   Cardiovascular: Negative for chest pain, palpitations and leg swelling.  Gastrointestinal: Negative for abdominal pain and nausea.  Genitourinary: Negative for difficulty urinating.        Increased frequency and nocturia.    Musculoskeletal: Negative for joint swelling and myalgias.  Skin: Negative for color change and rash.  Neurological: Negative for dizziness, light-headedness and headaches.  Psychiatric/Behavioral: Negative for agitation and dysphoric mood.  Objective:     Blood pressure rechecked by me:  128/76  Physical Exam  Constitutional: She appears well-developed and well-nourished. No distress.  HENT:  Nose: Nose normal.  Mouth/Throat: Oropharynx is clear and moist.  Neck: Neck supple. No thyromegaly present.  Cardiovascular: Normal rate and regular rhythm.  Pulmonary/Chest: Breath sounds normal. No respiratory distress. She has no wheezes.  Abdominal: Soft. Bowel sounds are normal. There is no tenderness.  Musculoskeletal: She exhibits no edema or tenderness.  Feet:  Decreased sensation up to mid lower leg.  No lesions.  DP pulses palpable and equal bilaterally.    Lymphadenopathy:    She has no cervical adenopathy.  Skin: No rash noted. No erythema.  Psychiatric: She has a normal mood and affect. Her behavior is normal.    BP 132/78 (BP Location: Left Arm, Patient Position: Sitting, Cuff Size: Normal)   Pulse 70   Temp 97.9 F (36.6 C) (Oral)   Resp 18   Wt 164 lb 3.2 oz (74.5 kg)   LMP  (LMP Unknown)   SpO2 97%   BMI 30.22 kg/m  Wt Readings from Last 3 Encounters:  02/04/18 164 lb 3.2 oz (74.5 kg)  12/27/17 164 lb 14.4 oz (74.8 kg)  09/29/17 164 lb 14.5 oz (74.8 kg)     Lab Results  Component Value Date   WBC 6.2 08/03/2017   HGB 12.0 08/03/2017   HCT 36.7 08/03/2017   PLT 190 08/03/2017   GLUCOSE 244 (H) 08/20/2016   CHOL 193 08/20/2016   TRIG 115 08/20/2016   HDL 68 08/20/2016   LDLCALC 102 (H) 08/20/2016   ALT 21 08/20/2016   AST 20 08/20/2016   NA 135 08/20/2016   K 4.3 08/20/2016   CL 97 08/20/2016   CREATININE 0.63 08/20/2016   BUN 10 08/20/2016   CO2 22 08/20/2016   TSH 3.22 03/09/2016   HGBA1C 10.5 (H)  08/20/2016   MICROALBUR 65.4 (H) 03/09/2016    Mm Diag Breast Tomo Bilateral  Result Date: 01/14/2018 CLINICAL DATA:  History of treated left breast cancer, status post lumpectomy in September 2018 and radiation therapy. EXAM: DIGITAL DIAGNOSTIC BILATERAL MAMMOGRAM WITH CAD AND TOMO COMPARISON:  Previous exam(s). ACR Breast Density Category b: There are scattered areas of fibroglandular density. FINDINGS: Mammographically, there are no suspicious masses, areas of nonsurgical architectural distortion or microcalcifications in either breast. Expected post lumpectomy and post radiation changes in the left breast. Mammographic images were processed with CAD. IMPRESSION: No mammographic evidence of malignancy in either breast, status post left lumpectomy. RECOMMENDATION: Diagnostic mammogram is suggested in 1 year. (Code:[id number]-B-01Y) I have discussed the findings and recommendations with the patient. Results were also provided in writing at the conclusion of the visit. If applicable, a reminder letter will be sent to the patient regarding the next appointment. BI-RADS CATEGORY  2: Benign. Electronically Signed   By: Fidela Salisbury M.D.   On: 01/14/2018 15:23       Assessment & Plan:   Problem List Items Addressed This Visit    A-fib [facility])    Has pacemaker in place.  Followed by Dr Ubaldo Glassing.  Stable.        Relevant Orders   TSH   CAD (coronary artery disease)    Stable.  Followed by cardiology.        Diabetes mellitus with neuropathy ([place])    Followed by endocrinology.  Sugars as outlined.  a1c 7.5.  Low carb diet and exercise.  Follow met b and  a1c.        Relevant Orders   Hemoglobin W9T   Basic metabolic panel   Microalbumin / creatinine urine ratio   Hypercholesterolemia    Low cholesterol diet and exercise.  Follow lipid panel.        Relevant Orders   Hepatic function panel   Lipid panel   Iron deficiency anemia    Follow cbc and ferritin.        Relevant Orders   CBC with  Differential/Platelet   Ferritin   Malignant neoplasm of upper-outer quadrant of left breast in female, estrogen receptor positive ([place])    Followed by Dr Bary Castilla and Dr Baruch Gouty.  On tamoxifen.  S/p XRT.  Follow.  Mammogram 01/14/18 - [place] II.        Vitamin D deficiency, unspecified    Follow vitamin D level.        Relevant Orders   VITAMIN D 25 Hydroxy (Vit-D Deficiency, Fractures)    Other Visit Diagnoses    Urinary frequency    -  Primary   discussed medication options.  check urinalysis to confirm no infection.     Relevant Orders   Urinalysis, Routine w reflex microscopic (Completed)   Urine Culture (Completed)   Nasal congestion       Congestion with cough.  Robitussin as directed.  nasacort nasal spray as directed.  Notify me if persistent.         [name], MD

## 2018-02-07 ENCOUNTER — Encounter: Payer: Self-pay | Admitting: Internal Medicine

## 2018-02-07 LAB — URINALYSIS, ROUTINE W REFLEX MICROSCOPIC
Bilirubin Urine: NEGATIVE
Glucose, UA: NEGATIVE
Hgb urine dipstick: NEGATIVE
Hyaline Cast: NONE SEEN /LPF
Ketones, ur: NEGATIVE
Nitrite: POSITIVE — AB
Specific Gravity, Urine: 1.015 (ref 1.001–1.03)
pH: 5.5 (ref 5.0–8.0)

## 2018-02-07 LAB — URINE CULTURE
MICRO NUMBER:: 90633476
SPECIMEN QUALITY:: ADEQUATE

## 2018-02-07 NOTE — Assessment & Plan Note (Signed)
Low cholesterol diet and exercise.  Follow lipid panel.   

## 2018-02-07 NOTE — Assessment & Plan Note (Signed)
Stable.  Followed by cardiology.   

## 2018-02-07 NOTE — Assessment & Plan Note (Signed)
Followed by Dr Bary Castilla and Dr Baruch Gouty.  On tamoxifen.  S/p XRT.  Follow.  Mammogram 01/14/18 - Birads II.

## 2018-02-07 NOTE — Assessment & Plan Note (Signed)
Follow cbc and ferritin.  

## 2018-02-07 NOTE — Assessment & Plan Note (Signed)
Followed by endocrinology.  Sugars as outlined.  a1c 7.5.  Low carb diet and exercise.  Follow met b and a1c.

## 2018-02-07 NOTE — Assessment & Plan Note (Signed)
Follow vitamin D level.  

## 2018-02-07 NOTE — Assessment & Plan Note (Signed)
Has pacemaker in place.  Followed by Dr Fath.  Stable.   

## 2018-02-08 MED ORDER — CEFUROXIME AXETIL 250 MG PO TABS: 250.0000 mg | ORAL_TABLET | Freq: Two times a day (BID) | ORAL | 0 refills | Status: DC

## 2018-02-08 NOTE — Addendum Note (Signed)
Addended by: Lars Masson on: 02/08/2018 05:01 PM   Modules accepted: Orders

## 2018-02-14 DIAGNOSIS — H353211 Exudative age-related macular degeneration, right eye, with active choroidal neovascularization: Secondary | ICD-10-CM | POA: Diagnosis not present

## 2018-03-01 DIAGNOSIS — H353221 Exudative age-related macular degeneration, left eye, with active choroidal neovascularization: Secondary | ICD-10-CM | POA: Diagnosis not present

## 2018-03-07 ENCOUNTER — Ambulatory Visit (INDEPENDENT_AMBULATORY_CARE_PROVIDER_SITE_OTHER): Payer: Medicare Other | Admitting: Podiatry

## 2018-03-07 ENCOUNTER — Encounter: Payer: Self-pay | Admitting: Podiatry

## 2018-03-07 DIAGNOSIS — L97521 Non-pressure chronic ulcer of other part of left foot limited to breakdown of skin: Secondary | ICD-10-CM

## 2018-03-07 DIAGNOSIS — I2510 Atherosclerotic heart disease of native coronary artery without angina pectoris: Secondary | ICD-10-CM

## 2018-03-07 DIAGNOSIS — L03032 Cellulitis of left toe: Secondary | ICD-10-CM | POA: Diagnosis not present

## 2018-03-07 DIAGNOSIS — L02612 Cutaneous abscess of left foot: Secondary | ICD-10-CM | POA: Diagnosis not present

## 2018-03-07 MED ORDER — MUPIROCIN 2 % EX OINT: TOPICAL_OINTMENT | CUTANEOUS | 1 refills | Status: DC

## 2018-03-07 MED ORDER — DOXYCYCLINE HYCLATE 100 MG PO TABS: 100.0000 mg | ORAL_TABLET | Freq: Two times a day (BID) | ORAL | 0 refills | Status: DC

## 2018-03-08 NOTE — Progress Notes (Signed)
She presents today concerned of a red second digit of her left foot.  She states the been swollen and red for the past 6 weeks.  She denies any trauma denies fever chills nausea vomiting muscle aches and pains.  States that her diabetes is doing pretty good.  Objective: Vital signs are stable she is alert and oriented x3 neurovascular status is unchanged.  Distal aspect of the toe does demonstrate erythema only to the level of the DIPJ mild edema and a pin-sized ulceration was debrided.  There is no purulence no malodor.  Assessment: Hammertoe deformity.  Diabetes with diabetic peripheral neuropathy.  Diabetic ulceration distal aspect of the second toe.  Plan: Started her on doxycycline start her on Bactroban ointment placed a crest pad to help hold the toe from the ground and I will follow-up with her in 2 weeks unless this worsens.  She will dress the toe daily as instructed in the office.

## 2018-03-30 ENCOUNTER — Encounter: Payer: Self-pay | Admitting: Podiatry

## 2018-03-30 ENCOUNTER — Ambulatory Visit (INDEPENDENT_AMBULATORY_CARE_PROVIDER_SITE_OTHER): Payer: Medicare Other | Admitting: Podiatry

## 2018-03-30 DIAGNOSIS — L97521 Non-pressure chronic ulcer of other part of left foot limited to breakdown of skin: Secondary | ICD-10-CM | POA: Diagnosis not present

## 2018-03-30 DIAGNOSIS — I2510 Atherosclerotic heart disease of native coronary artery without angina pectoris: Secondary | ICD-10-CM

## 2018-03-30 DIAGNOSIS — E1142 Type 2 diabetes mellitus with diabetic polyneuropathy: Secondary | ICD-10-CM | POA: Diagnosis not present

## 2018-03-30 MED ORDER — DOXYCYCLINE HYCLATE 100 MG PO TABS: 100.0000 mg | ORAL_TABLET | Freq: Two times a day (BID) | ORAL | 0 refills | Status: DC

## 2018-03-30 NOTE — Progress Notes (Signed)
She presents today for follow-up of ulceration to the distal aspect of the second digit of the left foot.  He states it is not as red as it was not nearly as tender.  She is finished her antibiotic and continues to soak the toe daily and dress it.  Objective: Vital signs are stable she is alert and oriented x3 pulses remain palpable.  Hammertoe deformity of the second metatarsal phalangeal joint is resulting in distal clavus and skin breakdown as well as plantarflexion of the second metatarsal and the reactive hyperkeratosis plantarly.  Assessment: Hammertoe deformity distal clavus much decrease in symptomatology and erythema.  Plan: Debrided reactive hyperkeratosis today I also recommended that we may need to do a flexor tenotomy next visit provided the ulceration has healed or nearly healed.  I also refilled her doxycycline today.

## 2018-04-04 ENCOUNTER — Ambulatory Visit: Payer: BLUE CROSS/BLUE SHIELD | Admitting: Radiation Oncology

## 2018-04-04 DIAGNOSIS — H353211 Exudative age-related macular degeneration, right eye, with active choroidal neovascularization: Secondary | ICD-10-CM | POA: Diagnosis not present

## 2018-04-05 DIAGNOSIS — I495 Sick sinus syndrome: Secondary | ICD-10-CM | POA: Diagnosis not present

## 2018-04-11 ENCOUNTER — Ambulatory Visit
Admission: RE | Admit: 2018-04-11 | Discharge: 2018-04-11 | Disposition: A | Discharge status: Home / Self Care | Payer: Medicare Other | Source: Ambulatory Visit | Attending: Radiation Oncology | Admitting: Radiation Oncology

## 2018-04-11 ENCOUNTER — Encounter: Payer: Self-pay | Admitting: Radiation Oncology

## 2018-04-11 ENCOUNTER — Other Ambulatory Visit: Payer: Self-pay | Admitting: *Deleted

## 2018-04-11 VITALS — BP 138/87 | HR 86 | Temp 98.7°F | Resp 18 | Wt 161.4 lb

## 2018-04-11 DIAGNOSIS — Z170 Estrogen receptor positive status [ER+]: Principal | ICD-10-CM

## 2018-04-11 DIAGNOSIS — C50412 Malignant neoplasm of upper-outer quadrant of left female breast: Secondary | ICD-10-CM

## 2018-04-11 NOTE — Progress Notes (Signed)
Radiation Oncology Follow up Note  Name: Holly Rose   Date:   04/11/2018 MRN:  [id number] DOB: October 22, 1929    This 84 y.o. 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 Seen today in routine follow she still having pain in her left shoulder more in the left scapular region. She does have a history of having come in contact with our treatment head during one of her treatments causing significant pain. She otherwise specifically denies breast tenderness cough or bone pain..she mammograms in May which I have reviewed were BI-RADS 2 benign. She is currently on tamoxifen tolerate that well without side effect.  COMPLICATIONS OF TREATMENT: none  FOLLOW UP COMPLIANCE: keeps appointments   PHYSICAL EXAM:  BP 138/87 (BP Location: Right Arm, Patient Position: Sitting)   Pulse 86   Temp 98.7 F (37.1 C) (Tympanic)   Resp 18   Wt 161 lb 6 oz (73.2 kg)   LMP  (LMP Unknown)   BMI 29.70 kg/m  Lungs are clear to A&P cardiac examination essentially unremarkable with regular rate and rhythm. No dominant mass or nodularity is noted in either breast in 2 positions examined. Incision is well-healed. No axillary or supraclavicular adenopathy is appreciated. Cosmetic result is excellent.she does have point tenderness at the region of the left supraclavicular area. She does have slight limitation of motion the left upper extremity.Well-developed well-nourished patient in NAD. HEENT reveals PERLA, EOMI, discs not visualized.  Oral cavity is clear. No oral mucosal lesions are identified. Neck is clear without evidence of cervical or supraclavicular adenopathy. Lungs are clear to  A&P. Cardiac examination is essentially unremarkable with regular rate and rhythm without murmur rub or thrill. Abdomen is benign with no organomegaly or masses noted. Motor sensory and DTR levels are equal and symmetric in the upper and lower extremities. Cranial nerves II through XII are grossly intact. Proprioception is intact. No peripheral adenopathy or edema is identified. No motor or sensory levels are noted. Crude visual fields are within normal range.  RADIOLOGY RESULTS: mammograms reviewed compatible with the above-stated findings. CT scan of the chest with bone windows ordered  PLAN: this time of ordered a CT scan of the chest with bone windows to rule out any possibility of fracture or progression of her disease. I've also requested a physical therapy consultation to work with her left shoulder. I'm alsocontacting Dr. Barbette Hair office for assigning her follow-up appointments. I'll see her back in one week after her CT scan to discuss those results. Otherwise she is to call with any concerns.  I would like to take this opportunity to thank you for allowing me to participate in the care of your patient.Noreene Filbert, MD

## 2018-04-11 NOTE — Progress Notes (Signed)
Pt in for follow 6 month of left breast cancer.  Reports "still having left shoulder pain and unable to raise left arm due to radiation machine incident at the last week of treatment."

## 2018-04-12 ENCOUNTER — Other Ambulatory Visit: Payer: Self-pay | Admitting: *Deleted

## 2018-04-12 DIAGNOSIS — Z170 Estrogen receptor positive status [ER+]: Principal | ICD-10-CM

## 2018-04-12 DIAGNOSIS — C50412 Malignant neoplasm of upper-outer quadrant of left female breast: Secondary | ICD-10-CM

## 2018-04-18 DIAGNOSIS — E559 Vitamin D deficiency, unspecified: Secondary | ICD-10-CM | POA: Diagnosis not present

## 2018-04-18 DIAGNOSIS — I4891 Unspecified atrial fibrillation: Secondary | ICD-10-CM | POA: Diagnosis not present

## 2018-04-18 DIAGNOSIS — D509 Iron deficiency anemia, unspecified: Secondary | ICD-10-CM | POA: Diagnosis not present

## 2018-04-18 DIAGNOSIS — E7800 Pure hypercholesterolemia, unspecified: Secondary | ICD-10-CM | POA: Diagnosis not present

## 2018-04-18 DIAGNOSIS — E1140 Type 2 diabetes mellitus with diabetic neuropathy, unspecified: Secondary | ICD-10-CM | POA: Diagnosis not present

## 2018-04-18 DIAGNOSIS — C50412 Malignant neoplasm of upper-outer quadrant of left female breast: Secondary | ICD-10-CM | POA: Diagnosis not present

## 2018-04-19 ENCOUNTER — Ambulatory Visit: Payer: BLUE CROSS/BLUE SHIELD

## 2018-04-19 DIAGNOSIS — H353221 Exudative age-related macular degeneration, left eye, with active choroidal neovascularization: Secondary | ICD-10-CM | POA: Diagnosis not present

## 2018-04-19 LAB — CBC WITH DIFFERENTIAL/PLATELET
Basophils Absolute: 0 10*3/uL (ref 0.0–0.2)
Basos: 0 %
EOS (ABSOLUTE): 0.1 10*3/uL (ref 0.0–0.4)
Eos: 1 %
Hematocrit: 39.3 % (ref 34.0–46.6)
Hemoglobin: 13.1 g/dL (ref 11.1–15.9)
Immature Grans (Abs): 0 10*3/uL (ref 0.0–0.1)
Immature Granulocytes: 0 %
Lymphocytes Absolute: 1.7 10*3/uL (ref 0.7–3.1)
Lymphs: 36 %
MCH: 30.2 pg (ref 26.6–33.0)
MCHC: 33.3 g/dL (ref 31.5–35.7)
MCV: 91 fL (ref 79–97)
Monocytes Absolute: 0.4 10*3/uL (ref 0.1–0.9)
Monocytes: 8 %
Neutrophils Absolute: 2.6 10*3/uL (ref 1.4–7.0)
Neutrophils: 55 %
Platelets: 158 10*3/uL (ref 150–450)
RBC: 4.34 x10E6/uL (ref 3.77–5.28)
RDW: 15.1 % (ref 12.3–15.4)
WBC: 4.7 10*3/uL (ref 3.4–10.8)

## 2018-04-19 LAB — HEPATIC FUNCTION PANEL
ALT: 18 IU/L (ref 0–32)
AST: 24 IU/L (ref 0–40)
Albumin: 4 g/dL (ref 3.5–4.7)
Alkaline Phosphatase: 42 IU/L (ref 39–117)
Bilirubin Total: 0.3 mg/dL (ref 0.0–1.2)
Bilirubin, Direct: 0.13 mg/dL (ref 0.00–0.40)
Total Protein: 7.3 g/dL (ref 6.0–8.5)

## 2018-04-19 LAB — BASIC METABOLIC PANEL
BUN/Creatinine Ratio: 14 (ref 12–28)
BUN: 9 mg/dL (ref 8–27)
CO2: 22 mmol/L (ref 20–29)
Calcium: 10.3 mg/dL (ref 8.7–10.3)
Chloride: 103 mmol/L (ref 96–106)
Creatinine, Ser: 0.63 mg/dL (ref 0.57–1.00)
GFR calc Af Amer: 94 mL/min/{1.73_m2} (ref >59)
GFR calc non Af Amer: 81 mL/min/{1.73_m2} (ref >59)
Glucose: 128 mg/dL — ABNORMAL HIGH (ref 65–99)
Potassium: 4.3 mmol/L (ref 3.5–5.2)
Sodium: 139 mmol/L (ref 134–144)

## 2018-04-19 LAB — LIPID PANEL
Chol/HDL Ratio: 2.9 ratio (ref 0.0–4.4)
Cholesterol, Total: 182 mg/dL (ref 100–199)
HDL: 63 mg/dL (ref >39)
LDL Calculated: 101 mg/dL — ABNORMAL HIGH (ref 0–99)
Triglycerides: 89 mg/dL (ref 0–149)
VLDL Cholesterol Cal: 18 mg/dL (ref 5–40)

## 2018-04-19 LAB — MICROALBUMIN / CREATININE URINE RATIO
Creatinine, Urine: 93 mg/dL
Microalb/Creat Ratio: 220.5 mg/g creat — ABNORMAL HIGH (ref 0.0–30.0)
Microalbumin, Urine: 205.1 ug/mL

## 2018-04-19 LAB — HEMOGLOBIN A1C
Est. average glucose Bld gHb Est-mCnc: 148 mg/dL
Hgb A1c MFr Bld: 6.8 % — ABNORMAL HIGH (ref 4.8–5.6)

## 2018-04-19 LAB — VITAMIN D 25 HYDROXY (VIT D DEFICIENCY, FRACTURES): Vit D, 25-Hydroxy: 44.2 ng/mL (ref 30.0–100.0)

## 2018-04-19 LAB — TSH: TSH: 4.89 u[IU]/mL — ABNORMAL HIGH (ref 0.450–4.500)

## 2018-04-19 LAB — FERRITIN: Ferritin: 30 ng/mL (ref 15–150)

## 2018-04-20 ENCOUNTER — Ambulatory Visit (INDEPENDENT_AMBULATORY_CARE_PROVIDER_SITE_OTHER): Payer: Medicare Other | Admitting: Podiatry

## 2018-04-20 ENCOUNTER — Encounter: Payer: Self-pay | Admitting: Podiatry

## 2018-04-20 DIAGNOSIS — L97521 Non-pressure chronic ulcer of other part of left foot limited to breakdown of skin: Secondary | ICD-10-CM

## 2018-04-20 DIAGNOSIS — E1142 Type 2 diabetes mellitus with diabetic polyneuropathy: Secondary | ICD-10-CM

## 2018-04-20 NOTE — Progress Notes (Signed)
She presents today for follow-up of her ulceration second digit left foot.  She states that is doing better.  Denies fever chills nausea vomiting muscle aches and pains.  Objective: Vital signs are stable alert and oriented x3.  Pulses are palpable  Reactive hyperkeratotic tissue plantar aspect of the second digit left.  Was debrided demonstrates superficial ulceration padding was placed and dressed.  Assessment: Chronic ulceration second digit left foot.  Plan: Discussed etiology pathology and surgical therapies redressed today dressed a compressive dressing follow-up with her in a couple weeks to make sure she is healing well.  Continue to soak.

## 2018-04-21 ENCOUNTER — Ambulatory Visit
Admission: RE | Admit: 2018-04-21 | Discharge: 2018-04-21 | Disposition: A | Discharge status: Home / Self Care | Payer: Medicare Other | Source: Ambulatory Visit | Attending: Radiation Oncology | Admitting: Radiation Oncology

## 2018-04-21 DIAGNOSIS — J439 Emphysema, unspecified: Secondary | ICD-10-CM | POA: Insufficient documentation

## 2018-04-21 DIAGNOSIS — M25512 Pain in left shoulder: Secondary | ICD-10-CM | POA: Insufficient documentation

## 2018-04-21 DIAGNOSIS — C50412 Malignant neoplasm of upper-outer quadrant of left female breast: Secondary | ICD-10-CM | POA: Insufficient documentation

## 2018-04-21 DIAGNOSIS — Z170 Estrogen receptor positive status [ER+]: Secondary | ICD-10-CM | POA: Insufficient documentation

## 2018-04-21 DIAGNOSIS — R911 Solitary pulmonary nodule: Secondary | ICD-10-CM | POA: Insufficient documentation

## 2018-04-21 DIAGNOSIS — C50912 Malignant neoplasm of unspecified site of left female breast: Secondary | ICD-10-CM | POA: Diagnosis not present

## 2018-04-21 HISTORY — DX: Squamous cell carcinoma of skin, unspecified: C44.92

## 2018-04-21 HISTORY — DX: Malignant neoplasm of unspecified site of left female breast: C50.912

## 2018-04-21 MED ORDER — IOHEXOL 300 MG/ML  SOLN
75.0000 mL | Freq: Once | INTRAMUSCULAR | Status: AC | PRN
  Administered 2018-04-21: 75 mL via INTRAVENOUS

## 2018-04-25 ENCOUNTER — Other Ambulatory Visit: Payer: Self-pay | Admitting: *Deleted

## 2018-04-25 ENCOUNTER — Other Ambulatory Visit: Payer: Self-pay

## 2018-04-25 ENCOUNTER — Ambulatory Visit
Admission: RE | Admit: 2018-04-25 | Discharge: 2018-04-25 | Disposition: A | Discharge status: Home / Self Care | Payer: Medicare Other | Source: Ambulatory Visit | Attending: Radiation Oncology | Admitting: Radiation Oncology

## 2018-04-25 VITALS — BP 136/81 | HR 85 | Temp 96.6°F | Wt 162.1 lb

## 2018-04-25 DIAGNOSIS — R911 Solitary pulmonary nodule: Secondary | ICD-10-CM

## 2018-04-25 DIAGNOSIS — Z923 Personal history of irradiation: Secondary | ICD-10-CM | POA: Insufficient documentation

## 2018-04-25 DIAGNOSIS — Z170 Estrogen receptor positive status [ER+]: Secondary | ICD-10-CM | POA: Insufficient documentation

## 2018-04-25 DIAGNOSIS — C50412 Malignant neoplasm of upper-outer quadrant of left female breast: Secondary | ICD-10-CM | POA: Diagnosis not present

## 2018-04-25 DIAGNOSIS — M25512 Pain in left shoulder: Secondary | ICD-10-CM | POA: Insufficient documentation

## 2018-04-25 NOTE — Progress Notes (Signed)
Radiation Oncology Follow up Note  Name: Holly Rose   Date:   04/25/2018 MRN:  [id number] DOB: 1929/11/22    This 84 y.o. female presents to the clinic today for follow-up of her CT of the chest results.  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 She did come in contact with her treatment head during treatment causing which he describes as continued chest pain. We had a CT scan of her chest showing no evidence of fracture She does have a pacemaker with significant scar tissue in the region of her chest pain. She also has a.6 mm right lower lobe pulmonary nodule which is new since July 2017 which we will continue to observe. She states she just doesn't feel well at this time.  COMPLICATIONS OF TREATMENT: none  FOLLOW UP COMPLIANCE: keeps appointments   PHYSICAL EXAM:  BP 136/81 (BP Location: Left Arm, Patient Position: Sitting)   Pulse 85   Temp (!) 96.6 F (35.9 C) (Tympanic)   Wt 162 lb 2.4 oz (73.5 kg)   LMP  (LMP Unknown)   BMI 29.84 kg/m  Well-developed well-nourished patient in NAD. HEENT reveals PERLA, EOMI, discs not visualized.  Oral cavity is clear. No oral mucosal lesions are identified. Neck is clear without evidence of cervical or supraclavicular adenopathy. Lungs are clear to A&P. Cardiac examination is essentially unremarkable with regular rate and rhythm without murmur rub or thrill. Abdomen is benign with no organomegaly or masses noted. Motor sensory and DTR levels are equal and symmetric in the upper and lower extremities. Cranial nerves II through XII are grossly intact. Proprioception is intact. No peripheral adenopathy or edema is identified. No motor or sensory levels are noted. Crude visual fields are within normal range.  RADIOLOGY RESULTS: CT scan is reviewed and compatible with the  above-stated findings  PLAN: this time I have scheduled the patient for physical therapy of her left shoulder. This certainly may be due to some arthritis. Another possibility is some significant scarring from her pacemaker. I've also ordered a CT scan and follow-up visit in 6 months to keep track of the 6 mm nodule. Patient and family all comprehend my treatment plan well.  I would like to take this opportunity to thank you for allowing me to participate in the care of your patient.Noreene Filbert, MD

## 2018-05-02 ENCOUNTER — Ambulatory Visit: Payer: BLUE CROSS/BLUE SHIELD

## 2018-05-03 ENCOUNTER — Ambulatory Visit: Payer: Medicare Other | Attending: Radiation Oncology

## 2018-05-03 ENCOUNTER — Other Ambulatory Visit: Payer: Self-pay

## 2018-05-03 DIAGNOSIS — M25512 Pain in left shoulder: Secondary | ICD-10-CM | POA: Diagnosis not present

## 2018-05-03 NOTE — Patient Instructions (Signed)
Access Code: [email or web address]  URL: [email or web address]  Date: 05/03/2018  Prepared by: Lieutenant Diego   Exercises  Correct Seated Posture - 10 reps - 1 sets - 3 hold - 2x daily - 7x weekly  Supine Diaphragmatic Breathing - 10 reps - 1 sets - 3 hold - 2x daily - 7x weekly  Shoulder External Rotation - 10 reps - 3 sets - 3 hold - 2x daily - 7x weekly

## 2018-05-03 NOTE — Therapy (Addendum)
[facility] [address] [place], [address] Phone: [phone]   Fax:  [phone]  Physical Therapy Evaluation  Patient Details  Name: Holly Rose MRN: [id number] Date of Birth: 08-Aug-1930 Referring Provider: Noreene Filbert   Encounter Date: 05/03/2018  PT End of Session - 05/03/18 1513    Visit Number  1    Number of Visits  18    Date for PT Re-Evaluation  06/28/18    Authorization Type  1    Authorization Time Period  of 10 Medicare progress note    PT Start Time  1347    PT Stop Time  1447    PT Time Calculation (min)  60 min    Activity Tolerance  Patient tolerated treatment well    Behavior During Therapy  [facility] for tasks assessed/performed       Past Medical History:  Diagnosis Date  . Allergy   . Anemia   . Arthritis   . Atrial fibrillation ([place]) 2004  . Breast cancer, left ([place]) 10/2017   Lumpectomy and rad tx's.   Marland Kitchen CAD (coronary artery disease)   . Complication of anesthesia    hard to wake up with general anesthesia  . Diabetes ([place])   . Dysrhythmia    A-fib  . Heart attack ([place]) 2008  . Heart murmur   . History of kidney stones   . History of sick sinus syndrome    s/p pacemaker placement  . Hyperlipidemia   . Macular degeneration   . Neuropathy   . Neuropathy   . Pacemaker 06/2010  . Personal history of radiation therapy 11/2017   LEFT lumpectomy   . Squamous cell skin cancer   . TIA (transient ischemic attack)     Past Surgical History:  Procedure Laterality Date  . ABDOMINAL HYSTERECTOMY  1960's  . APPENDECTOMY  1947  . [place]  . BREAST BIOPSY Left 12/17/2016   SINGLE FRAGMENT OF ATYPICAL EPITHELIAL CELLS  . BREAST BIOPSY Left 04/08/2017  . BREAST EXCISIONAL BIOPSY Left 01/18/1986   Benign microcalcifications, [employer].  Marland Kitchen BREAST LUMPECTOMY Left 05/26/2017   T1c, N0; ER/ PR+, Her 2 neu negative, HIGH RISK by Mammoprint ;   Surgeon: [name], MD;  Location: [facility] ORS;  Service: General;  Laterality: Left;  . BREAST SURGERY  1988  . CARDIAC CATHETERIZATION  1989  . CATARACT EXTRACTION  1997 and 1998  . COLONOSCOPY WITH PROPOFOL N/A 09/12/2015   Procedure: COLONOSCOPY WITH PROPOFOL;  Surgeon: [name], MD;  Location: [facility] ENDOSCOPY;  Service: Endoscopy;  Laterality: N/A;  . KIDNEY STONE SURGERY  2018  . pace maker  2008  . [place]  . TONSILLECTOMY AND ADENOIDECTOMY  1950"s    Subjective Assessment - 05/03/18 1401    Subjective  Patient reports some pain at start of session in superior/lateral shoulder. Worse with reaching, and sore after reaching.     Patient is accompained by:  Family member    Pertinent History  Patient presents with acute L shoulder pain that manifested end of May after she came into contact with the radiation equipment. Stated that her pain was immediately severe. Since initial onset the pain has improved,  but still reports significant pain that limits functional abilities such as reaching forward, reaching backwards, lifting, carrying, bathing, dressing, etc. Reports medications (tylenol) can help decrease pain as well as resting her  arm. Patient underwent radiation therapy for breast cancer and finished radiation about 6 months ago. CT scan shows soft tissue scarring around placement of pacemaker as well.     Limitations  Lifting;Writing;House hold activities;Other (comment)   dressing, bathing   Patient Stated Goals  pain relief, return to PLOF    Currently in Pain?  Yes    Pain Score  5     Pain Location  Shoulder    Pain Orientation  Left    Pain Descriptors / Indicators  Aching;Constant    Pain Type  Acute pain    Pain Radiating Towards  towards elbow, rarely radiates past elbow    Pain Onset  More than a month ago    Pain Frequency  Constant    Multiple Pain Sites  No       [facility] PT Assessment - 05/03/18 0001      Assessment   Medical Diagnosis   L shoulder pain    Referring Provider  Eulas Post, [place]    Hand Dominance  Right    Prior Therapy  no      Precautions   Precautions  None      Restrictions   Weight Bearing Restrictions  No      Balance Screen   Has the patient fallen in the past 6 months  No    Has the patient had a decrease in activity level because of a fear of falling?   Yes    Is the patient reluctant to leave their home because of a fear of falling?   No      Home Film/video editor residence    Living Arrangements  Spouse/significant other    Available Help at Discharge  Family    Type of [place]      Prior Function   Level of [place]  Retired      Associate Professor   Overall Cognitive Status  Within Functional Limits for tasks assessed      Posture/Postural Control   Posture Comments  forward head, rounded shoulders      ROM / Strength   AROM / PROM / Strength  AROM;PROM;Strength      AROM   Overall AROM   Due to pain;Deficits    AROM Assessment Site  Cervical;Shoulder    Right/Left Shoulder  Left;Right    Right Shoulder Flexion  140 Degrees    Right Shoulder ABduction  120 Degrees    Right Shoulder Internal Rotation  20 Degrees   apley IR T10   Right Shoulder External Rotation  50 Degrees   apley ER C5   Left Shoulder Flexion  110 Degrees    Left Shoulder ABduction  90 Degrees    Left Shoulder Internal Rotation  20 Degrees   apley IR to L1   Left Shoulder External Rotation  35 Degrees   apley ER to occiput   Cervical Flexion  WFL    Cervical Extension  50%    Cervical - Right Side Bend  50%    Cervical - Left Side Bend  50%    Cervical - Right Rotation  70%    Cervical - Left Rotation  70%      PROM   Overall PROM   Unable to assess;Due to pain      Strength   Overall Strength  Deficits    Strength Assessment Site  Elbow;Shoulder    Right/Left Shoulder  Right;Left    Right Shoulder Flexion  4/5    Right Shoulder ABduction   4-/5    Right Shoulder Internal Rotation  4-/5    Right Shoulder External Rotation  4-/5    Left Shoulder Flexion  2+/5    Left Shoulder ABduction  2+/5    Left Shoulder Internal Rotation  4-/5    Left Shoulder External Rotation  3+/5    Right/Left Elbow  Left;Right    Right Elbow Flexion  4/5    Right Elbow Extension  4/5    Left Elbow Flexion  4-/5    Left Elbow Extension  4/5      Palpation   Palpation comment  hypersentivity and hypertonicity of L UT, bicipital tendon, bicep belly, supraspinatus      Special Tests   Other special tests  ER lag -, empty can +, hornblowers -, resisted ER mild +       Objective measurements completed on examination: See above findings.    Patient instructions for HEP: Access Code: [email or web address]  URL: [email or web address]  Date: 05/03/2018  Prepared by: Lieutenant Diego   Exercises  Correct Seated Posture - 10 reps - 1 sets - 3 hold - 2x daily - 7x weekly  Supine Diaphragmatic Breathing - 10 reps - 1 sets - 3 hold - 2x daily - 7x weekly  Shoulder External Rotation - 10 reps - 3 sets - 3 hold - 2x daily - 7x weekly   PT Education - 05/03/18 1513    Education Details  Patient educated on condition, importance of mobility, therex    Person(s) Educated  Patient    Methods  Explanation;Demonstration;Verbal cues;Tactile cues;Handout    Comprehension  Verbalized understanding;Returned demonstration;Verbal cues required;Tactile cues required      Therapeutic exercise: Patient performed with instruction, verbal cues, tactile cues of therapist: goal: HEP instruction, to improve postural/ROM/relaxation  In sitting:  Posture corrections with verbal/visual cues, 10x3sec holds B/L shoulder ER with verbal/tactile cues 10x3" with cues to stay in pain free range.   In supine: Discussion, explanation, demonstration of diaphragmatic breathing to promote relaxation, decrease hypersensitivity  PT Short Term Goals - 05/03/18 1521      PT SHORT  TERM GOAL #1   Title  Patient will report adherence at least 3x a week to home exercise program to improve strength/mobility for better functional independence with ADLs.    Time  4    Period  Weeks    Status  New    Target Date  05/31/18        PT Long Term Goals - 05/03/18 1532      PT LONG TERM GOAL #1   Title  Patient will improve L shoulder AROM to equal to or 140 degrees of flexion, scaption, and abduction for improved ability to perform overhead activities.    Time  8    Period  Weeks    Status  New    Target Date  06/28/18      PT LONG TERM GOAL #2   Title  Patient will be able to perform household work/ chores with pain 3/10 or less.    Time  8    Period  Weeks    Status  New    Target Date  06/28/18      PT LONG TERM GOAL #3   Title   Patient will decrease Quick DASH score by > 8 points demonstrating reduced self-reported upper extremity disability.     Baseline  36 score, 56.82%    Time  8    Period  Weeks    Status  New    Target Date  06/28/18      PT LONG TERM GOAL #4   Title  Patient will improve shoulder external rotation in order to maintain hair care with pain 3/10 or less.    Time  8    Period  Weeks    Status  New    Target Date  06/28/18       Plan - 05/03/18 1515    Clinical Impression Statement  Upon assessment patient demonstrates significant limitations in L shoulder ROM, strength, postural abnormalities, impaired soft tissue integrity, and ability to perform functional activities such as bathing,dressing, reaching, carrying, lifting. The patient would benefit from further skilled PT intervention to address these limitations to maximize functional abilities.    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 Decreased L UE use, weakness, pain with arm in varying position, reaching, carrying    PT Frequency  2x / week    PT Duration  8 weeks    PT Treatment/Interventions  ADLs/Self Care Home Management;Neuromuscular re-education;Dry needling;Energy conservation;Functional mobility training;Moist Heat;Aquatic Therapy;Therapeutic activities;Patient/family education;Cryotherapy;Balance training;Passive range of motion;Scar mobilization;Compression bandaging;Therapeutic exercise    PT Next Visit Plan  Address UT spasm, rotator cuff isometrics, postural training, scapular strengthening, AAROM    PT Home Exercise Plan  postural correction, diaphragmatic breathing, AROM ER/IR    Consulted and Agree with Plan of Care  Patient;Family member/caregiver       Patient will benefit from skilled therapeutic intervention in order to improve the following deficits and impairments:  Pain, Improper body mechanics, Postural dysfunction, Impaired tone, Increased muscle spasms, Decreased mobility, Decreased activity tolerance, Decreased endurance, Decreased range of motion, Decreased strength, Hypomobility  Visit Diagnosis: Acute pain of left shoulder   Problem List Patient Active Problem List   Diagnosis Date Noted  . Vitamin D deficiency, unspecified 06/16/2017  . Hypercalcinuria 06/14/2017  . SCC (squamous cell carcinoma), face 03/29/2017  . History of kidney stones 03/12/2017  . Malignant neoplasm of upper-outer quadrant of left breast in female, estrogen receptor positive ([place]) 12/28/2016  . Chronic cystitis 11/05/2016  . Nephrolithiasis 11/05/2016  . Renal colic  13/04/6577  . Urge incontinence 11/05/2016  . Bilateral hand pain 11/03/2016  . Generalized osteoarthritis of hand 11/03/2016  . Numbness and tingling in both hands 11/03/2016  . Bradycardia 04/15/2016  . History of colonic polyps 09/16/2015  . Abdominal pain 08/18/2015  . Rectal bleeding 08/18/2015  . CAD (coronary artery disease) 02/17/2015  . A-fib ([place]) 02/17/2015  . Diabetes mellitus with neuropathy ([place]) 02/17/2015  . Hypercholesterolemia 02/17/2015  . Iron deficiency anemia 02/17/2015  . Macular degeneration 02/17/2015  . Stress 02/17/2015  . Health care maintenance 02/17/2015  . Diarrhea 02/17/2015  . Hyperlipidemia, unspecified 07/13/2014  . Type II diabetes mellitus ([place]) 07/13/2014  .  Hyperlipidemia due to type 2 diabetes mellitus ([place]) 07/13/2014  . Diabetic neuropathy ([place]) 03/31/2013  . Diverticulosis 03/31/2013  . GI bleeding 03/31/2013  . Mitral valve prolapse 03/31/2013  . Diverticulosis of intestine without perforation or abscess without bleeding 03/31/2013    Lieutenant Diego PT, DPT 5:59 PM,05/03/18 [facility] [address] [place], [address] Phone: [phone]   Fax:  [phone]  Name: Holly Rose MRN: [id number] Date of Birth: 1929-11-05

## 2018-05-09 ENCOUNTER — Ambulatory Visit: Payer: Medicare Other

## 2018-05-09 DIAGNOSIS — M25512 Pain in left shoulder: Secondary | ICD-10-CM

## 2018-05-09 NOTE — Therapy (Signed)
[facility] [address], [place], [address] Phone: [phone]   Fax:  [phone]  Physical Therapy Treatment  Patient Details  Name: Holly Rose MRN: [id number] Date of Birth: 1930/04/10 Referring Provider: Noreene Filbert   Encounter Date: 05/09/2018  PT End of Session - 05/09/18 1431    Visit Number  2    Number of Visits  18    Date for PT Re-Evaluation  06/28/18    Authorization Type  medicare    Authorization Time Period  1/10    PT Start Time  1350    PT Stop Time  1430    PT Time Calculation (min)  40 min    Activity Tolerance  Patient tolerated treatment well    Behavior During Therapy  [facility] for tasks assessed/performed       Past Medical History:  Diagnosis Date  . Allergy   . Anemia   . Arthritis   . Atrial fibrillation ([place]) 2004  . Breast cancer, left ([place]) 10/2017   Lumpectomy and rad tx's.   Marland Kitchen CAD (coronary artery disease)   . Complication of anesthesia    hard to wake up with general anesthesia  . Diabetes ([place])   . Dysrhythmia    A-fib  . Heart attack ([place]) 2008  . Heart murmur   . History of kidney stones   . History of sick sinus syndrome    s/p pacemaker placement  . Hyperlipidemia   . Macular degeneration   . Neuropathy   . Neuropathy   . Pacemaker 06/2010  . Personal history of radiation therapy 11/2017   LEFT lumpectomy   . Squamous cell skin cancer   . TIA (transient ischemic attack)     Past Surgical History:  Procedure Laterality Date  . ABDOMINAL HYSTERECTOMY  1960's  . APPENDECTOMY  1947  . [place]  . BREAST BIOPSY Left 12/17/2016   SINGLE FRAGMENT OF ATYPICAL EPITHELIAL CELLS  . BREAST BIOPSY Left 04/08/2017  . BREAST EXCISIONAL BIOPSY Left 01/18/1986   Benign microcalcifications, [employer].  Marland Kitchen BREAST LUMPECTOMY Left 05/26/2017   T1c, N0; ER/ PR+, Her 2 neu negative, HIGH RISK by Mammoprint ;  Surgeon: [name], MD;  Location: [facility] ORS;  Service: General;  Laterality: Left;  . BREAST SURGERY  1988  . CARDIAC CATHETERIZATION  1989  . CATARACT EXTRACTION  1997 and 1998  . COLONOSCOPY WITH PROPOFOL N/A 09/12/2015   Procedure: COLONOSCOPY WITH PROPOFOL;  Surgeon: [name], MD;  Location: [facility] ENDOSCOPY;  Service: Endoscopy;  Laterality: N/A;  . KIDNEY STONE SURGERY  2018  . pace maker  2008  . [place]  . TONSILLECTOMY AND ADENOIDECTOMY  1950"s    There were no vitals filed for this visit.  Subjective Assessment - 05/09/18 1351    Subjective  Patient reports that her shoulders are feeling better, but she is still getting severe pain with some of her activities. States she has had a lot of company, has not had a chance to do her exercises, trying to do some.     Pertinent History  Patient presents with acute L shoulder pain that manifested end of May after she came into contact with the radiation equipment. Stated that her pain was immediately severe. Since initial onset the pain has improved,  but still reports significant pain that limits functional abilities such as reaching forward, reaching  backwards, lifting, carrying, bathing, dressing, etc. Reports medications (tylenol) can help decrease pain as well as resting her arm. Patient underwent radiation therapy for breast cancer and finished radiation about 6 months ago. CT scan shows soft tissue scarring around placement of pacemaker as well.     Patient Stated Goals  pain relief, return to PLOF    Currently in Pain?  Yes    Pain Score  3     Pain Location  Shoulder    Pain Orientation  Left    Pain Descriptors / Indicators  Aching    Pain Type  Acute pain    Pain Onset  More than a month ago       Manual therapy: x15mins Superficial and IASTM to L anterior delt, bicep, UT for tissue elasticity/pain management  Therapeutic exercise: Patient performed with instruction, verbal cues, tactile cues of therapist: goal:  HEP instruction, to improve postural/ROM/relaxation   In sitting:  Posture corrections with scapular retractions with verbal/visual cues, 10x3sec holds with tactile cues B/L shoulder ER with verbal/tactile cues 10x3" with cues to stay in pain free range with visual cues Isometric contractions for pain relief and strengthening all 6 directions (ext/flex/ir/er, elbow flex/extend) 10x3sec holds with PT assist and verbal/tactile cues Sitting shoulder flexion/abduction (L) table slides 10x3" in pain free range with demo/verbal cues from PT  Response to treatment: Patient demonstrates improved L shoulder ROM post session     PT Education - 05/09/18 1503    Education Details  therex    Person(s) Educated  Patient    Methods  Explanation;Demonstration;Verbal cues;Tactile cues    Comprehension  Verbalized understanding       PT Short Term Goals - 05/03/18 1521      PT SHORT TERM GOAL #1   Title  Patient will report adherence at least 3x a week to home exercise program to improve strength/mobility for better functional independence with ADLs.    Time  4    Period  Weeks    Status  New    Target Date  05/31/18        PT Long Term Goals - 05/03/18 1532      PT LONG TERM GOAL #1   Title  Patient will improve L shoulder AROM to equal to or 140 degrees of flexion, scaption, and abduction for improved ability to perform overhead activities.    Time  8    Period  Weeks    Status  New    Target Date  06/28/18      PT LONG TERM GOAL #2   Title  Patient will be able to perform household work/ chores with pain 3/10 or less.    Time  8    Period  Weeks    Status  New    Target Date  06/28/18      PT LONG TERM GOAL #3   Title   Patient will decrease Quick DASH score by > 8 points demonstrating reduced self-reported upper extremity disability.     Baseline  36 score, 56.82%    Time  8    Period  Weeks    Status  New    Target Date  06/28/18      PT LONG TERM GOAL #4   Title  Patient  will improve shoulder external rotation in order to maintain hair care with pain 3/10 or less.    Time  8    Period  Weeks    Status  New  Target Date  06/28/18            Plan - 05/09/18 1503    Clinical Impression Statement  Patient reported improvement in mobility at end of session of LUE, though pain still unchanged from start of session. Patient still presents with limitations in LUE ROM and strength and would benefit from continued therapy to address these deficits,    Clinical Impairments Affecting Rehab Potential  whole breast radiation to her left breast for stage I invasive mammary carcinoma ER/PR positive, pacemaker placement, new R lobe pulmonary nodule new since July, being watched. Decreased L UE use, weakness, pain with arm in varying position, reaching, carrying    PT Frequency  2x / week    PT Duration  8 weeks    PT Treatment/Interventions  ADLs/Self Care Home Management;Neuromuscular re-education;Dry needling;Energy conservation;Functional mobility training;Moist Heat;Aquatic Therapy;Therapeutic activities;Patient/family education;Cryotherapy;Balance training;Passive range of motion;Scar mobilization;Compression bandaging;Therapeutic exercise    PT Next Visit Plan  Address UT spasm, rotator cuff isometrics, postural training, scapular strengthening, AAROM    PT Home Exercise Plan  postural correction, diaphragmatic breathing, AROM ER/IR    Consulted and Agree with Plan of Care  Patient       Patient will benefit from skilled therapeutic intervention in order to improve the following deficits and impairments:  Pain, Improper body mechanics, Postural dysfunction, Impaired tone, Increased muscle spasms, Decreased mobility, Decreased activity tolerance, Decreased endurance, Decreased range of motion, Decreased strength, Hypomobility  Visit Diagnosis: Acute pain of left shoulder     Problem List Patient Active Problem List   Diagnosis Date Noted  . Vitamin D  deficiency, unspecified 06/16/2017  . Hypercalcinuria 06/14/2017  . SCC (squamous cell carcinoma), face 03/29/2017  . History of kidney stones 03/12/2017  . Malignant neoplasm of upper-outer quadrant of left breast in female, estrogen receptor positive ([place]) 12/28/2016  . Chronic cystitis 11/05/2016  . Nephrolithiasis 11/05/2016  . Renal colic 59/56/3875  . Urge incontinence 11/05/2016  . Bilateral hand pain 11/03/2016  . Generalized osteoarthritis of hand 11/03/2016  . Numbness and tingling in both hands 11/03/2016  . Bradycardia 04/15/2016  . History of colonic polyps 09/16/2015  . Abdominal pain 08/18/2015  . Rectal bleeding 08/18/2015  . CAD (coronary artery disease) 02/17/2015  . A-fib ([place]) 02/17/2015  . Diabetes mellitus with neuropathy ([place]) 02/17/2015  . Hypercholesterolemia 02/17/2015  . Iron deficiency anemia 02/17/2015  . Macular degeneration 02/17/2015  . Stress 02/17/2015  . Health care maintenance 02/17/2015  . Diarrhea 02/17/2015  . Hyperlipidemia, unspecified 07/13/2014  . Type II diabetes mellitus ([place]) 07/13/2014  . Hyperlipidemia due to type 2 diabetes mellitus ([place]) 07/13/2014  . Diabetic neuropathy ([place]) 03/31/2013  . Diverticulosis 03/31/2013  . GI bleeding 03/31/2013  . Mitral valve prolapse 03/31/2013  . Diverticulosis of intestine without perforation or abscess without bleeding 03/31/2013   Lieutenant Diego PT, DPT 8:18 AM,05/10/18 [facility] [address], [place], [address] Phone: [phone]   Fax:  [phone]  Name: KAYLYNN CHAMBLIN MRN: [id number] Date of Birth: 1930/04/24

## 2018-05-10 ENCOUNTER — Encounter: Payer: Self-pay | Admitting: General Surgery

## 2018-05-10 ENCOUNTER — Ambulatory Visit (INDEPENDENT_AMBULATORY_CARE_PROVIDER_SITE_OTHER): Payer: Medicare Other | Admitting: General Surgery

## 2018-05-10 VITALS — BP 140/70 | HR 68 | Resp 16 | Ht 61.8 in | Wt 160.0 lb

## 2018-05-10 DIAGNOSIS — C50412 Malignant neoplasm of upper-outer quadrant of left female breast: Secondary | ICD-10-CM | POA: Diagnosis not present

## 2018-05-10 DIAGNOSIS — Z170 Estrogen receptor positive status [ER+]: Secondary | ICD-10-CM

## 2018-05-10 DIAGNOSIS — I2510 Atherosclerotic heart disease of native coronary artery without angina pectoris: Secondary | ICD-10-CM

## 2018-05-10 NOTE — Progress Notes (Signed)
Patient ID: [name], female   DOB: 1929-12-29, 84 y.o.   MRN: [id number]  Chief Complaint  Patient presents with  . Follow-up    HPI Holly Rose is a 84 y.o. female.  who presents for her follow up left breast cancer and a breast evaluation referred by Dr Baruch Gouty. The most recent mammogram was done on 01-14-18. She has completed whole breast radiation. Tolerating Tamoxifen. Patient does perform regular self breast checks and gets regular mammograms done.  No new breast issues. She is going to PT for her left shoulder pain. She is here with her daughter in law, Brantleigh Mifflin.  HPI  Past Medical History:  Diagnosis Date  . Allergy   . Anemia   . Arthritis   . Atrial fibrillation ([place]) 2004  . Breast cancer, left ([place]) 10/2017   Lumpectomy and rad tx's.   Marland Kitchen CAD (coronary artery disease)   . Complication of anesthesia    hard to wake up with general anesthesia  . Diabetes ([place])   . Dysrhythmia    A-fib  . Heart attack ([place]) 2008  . Heart murmur   . History of kidney stones   . History of sick sinus syndrome    s/p pacemaker placement  . Hyperlipidemia   . Macular degeneration   . Neuropathy   . Neuropathy   . Pacemaker 06/2010  . Personal history of radiation therapy 11/2017   LEFT lumpectomy   . Squamous cell skin cancer   . TIA (transient ischemic attack)     Past Surgical History:  Procedure Laterality Date  . ABDOMINAL HYSTERECTOMY  1960's  . APPENDECTOMY  1947  . [place]  . BREAST BIOPSY Left 12/17/2016   SINGLE FRAGMENT OF ATYPICAL EPITHELIAL CELLS  . BREAST BIOPSY Left 04/08/2017  . BREAST EXCISIONAL BIOPSY Left 01/18/1986   Benign microcalcifications, [employer].  Marland Kitchen BREAST LUMPECTOMY Left 05/26/2017   T1c, N0; ER/ PR+, Her 2 neu negative, HIGH RISK by Mammoprint ;  Surgeon: [name], MD;  Location: [facility] ORS;  Service: General;  Laterality: Left;  . BREAST SURGERY  1988  . CARDIAC CATHETERIZATION  1989  .  CATARACT EXTRACTION  1997 and 1998  . COLONOSCOPY WITH PROPOFOL N/A 09/12/2015   Procedure: COLONOSCOPY WITH PROPOFOL;  Surgeon: [name], MD;  Location: [facility] ENDOSCOPY;  Service: Endoscopy;  Laterality: N/A;  . KIDNEY STONE SURGERY  2018  . MOHS SURGERY  2019   forehead  . pace maker  2008  . [place]  . TONSILLECTOMY AND ADENOIDECTOMY  1950"s    Family History  Problem Relation Age of Onset  . Stroke Mother   . Diabetes Mother   . Cancer Maternal Aunt        Breast Cancer  . Breast cancer Maternal Aunt   . Arthritis Maternal Grandmother   . Cancer Maternal Aunt        Lung Cancer  . Diabetes Son   . Cancer Son   . Kidney disease Neg Hx   . Bladder Cancer Neg Hx     Social History Social History   Tobacco Use  . Smoking status: Former Smoker    Years: 5.00    Last attempt to quit: 09/14/1968    Years since quitting: 49.6  . Smokeless tobacco: Never Used  . Tobacco comment: quit 1970  Substance Use Topics  . Alcohol use: No    Alcohol/week: 0.0 standard  drinks  . Drug use: No    Allergies  Allergen Reactions  . Lyrica [Pregabalin] Swelling and Other (See Comments)    Other reaction(s): SWELLING/EDEMA Sleepy, does not eat  . Neurontin [Gabapentin] Nausea And Vomiting and Other (See Comments)    disoriented  . Bactrim [Sulfamethoxazole-Trimethoprim]     Made her dizziness    Current Outpatient Medications  Medication Sig Dispense Refill  . Aflibercept (EYLEA) 2 MG/0.05ML SOLN by Intravitreal route.    Marland Kitchen aspirin 81 MG tablet Take 81 mg by mouth daily.    . Biotin w/ Vitamins C & E (HAIR/SKIN/NAILS PO) Take 1 tablet by mouth daily.    . blood glucose meter kit and supplies Per patient please dispense One touch Ultra2 meter.Use meter and supplies three times a day to check blood sugar. ICD10: E11.40 1 each 0  . Cholecalciferol (D3-50 PO) Take 1 tablet by mouth daily.    . furosemide (LASIX) 20 MG tablet Take 20 mg by mouth daily.     Marland Kitchen  glucose blood (ONE TOUCH ULTRA TEST) test strip USE ONE STRIP TO CHECK GLUCOSE THREE TIMES DAILY 100 each 2  . Lactobacillus (PROBIOTIC ACIDOPHILUS PO) Take 1 tablet by mouth daily.     Marland Kitchen MAGNESIUM SULFATE PO Take 1 tablet by mouth daily.     . metFORMIN (GLUCOPHAGE-XR) 500 MG 24 hr tablet TAKE 2 TABLETS BY MOUTH TWICE DAILY 360 tablet 0  . metoprolol (LOPRESSOR) 50 MG tablet Take 50 mg by mouth daily.     . Multiple Vitamins-Minerals (PRESERVISION AREDS 2 PO) Take 1 tablet by mouth daily.     . mupirocin ointment (BACTROBAN) 2 % Apply to wound after soaking BID 30 g 1  . Polyethyl Glycol-Propyl Glycol (SYSTANE OP) Apply 1 drop to eye 2 (two) times daily.    . tamoxifen (NOLVADEX) 20 MG tablet Take 1 tablet (20 mg total) by mouth daily. 30 tablet 11  . vitamin B-12 (CYANOCOBALAMIN) 1000 MCG tablet Take 1,000 mcg by mouth daily.     No current facility-administered medications for this visit.     Review of Systems Review of Systems  Constitutional: Negative.   Respiratory: Negative.  Negative for apnea.   Cardiovascular: Negative.     Blood pressure 140/70, pulse 68, resp. rate 16, height 5' 1.8" (1.57 m), weight 160 lb (72.6 kg).  Physical Exam Physical Exam  Constitutional: She is oriented to person, place, and time. She appears well-developed and well-nourished.  HENT:  Mouth/Throat: Oropharynx is clear and moist.  Eyes: Conjunctivae are normal. No scleral icterus.  Neck: Neck supple.  Cardiovascular: Normal rate, regular rhythm and normal heart sounds.  Pulmonary/Chest: Effort normal and breath sounds normal. Right breast exhibits no inverted nipple, no mass, no nipple discharge, no skin change and no tenderness. Left breast exhibits no inverted nipple, no mass, no nipple discharge, no skin change and no tenderness.  Left lumpectomy incision scar well healed, mild tenderness at surgery site.    Musculoskeletal:       Arms: Limited range of motion left upper extremity.    Lymphadenopathy:    She has no cervical adenopathy.    She has no axillary adenopathy.       Right: No supraclavicular adenopathy present.  Neurological: She is alert and oriented to person, place, and time.  Skin: Skin is warm and dry.  Psychiatric: Her behavior is normal.    Data Reviewed Bilateral diagnostic mammograms of Jan 14, 2018 reviewed.  Postsurgical changes.  BI-RADS-2.  Chest CT of April 21, 2018 reviewed, obtained for left shoulder weakness.  8 mm right lower lobe nodule.  One-year follow-up planned.  Shoulder muscular appears intact.  Joint space preserved compared to contralateral side.  No clear reason for her deltoid weakness.   Medical oncology notes of August 24, 2017 reviewed.  Decision to defer adjuvant chemotherapy and make use of antiestrogen therapy.  Based on bone density with negative T score of 2.5 tamoxifen chosen.  Patient tolerating well.  Assessment    No evidence of recurrent cancer in the upper outer quadrant of the left breast.  Left shoulder dysfunction, early favorable response to physical therapy.  Right lower lobe lung nodule, incidental finding.  Follow-up CT scheduled in 1 year.    Plan    Recommend aspirin 81 mg daily minimize risk of clotting while on tamoxifen.  Follow up in one year, no mammograms.  (Mammograms are being scheduled by Dr. Grayland Ormond in May 2020)  The patient is aware to call back for any questions or concerns.      HPI, Physical Exam, Assessment and Plan have been scribed under the direction and in the presence of [name], MD. Karie Fetch, RN  I have completed the exam and reviewed the above documentation for accuracy and completeness.  I agree with the above.  Haematologist has been used and any errors in dictation or transcription are unintentional.  Hervey Ard, M.D., F.A.C.S.  Forest Gleason Byrnett 05/10/2018, 2:39 PM

## 2018-05-10 NOTE — Patient Instructions (Addendum)
The patient is aware to call back for any questions or concerns.      Recommend aspirin 81 mg daily.

## 2018-05-10 NOTE — Addendum Note (Signed)
Addended by: Wyline Beady on: 05/10/2018 01:25 PM   Modules accepted: Orders

## 2018-05-11 ENCOUNTER — Ambulatory Visit: Payer: Medicare Other

## 2018-05-11 ENCOUNTER — Ambulatory Visit: Payer: Medicare Other | Admitting: Podiatry

## 2018-05-11 DIAGNOSIS — M25512 Pain in left shoulder: Secondary | ICD-10-CM

## 2018-05-11 NOTE — Therapy (Signed)
[place] [facility] [address], [place], [address] Phone: [phone]   Fax:  [phone]  Physical Therapy Treatment  Patient Details  Name: Holly Rose MRN: [id number] Date of Birth: 12/23/1929 Referring Provider: Noreene Filbert   Encounter Date: 05/11/2018  PT End of Session - 05/11/18 1328    Visit Number  3    Number of Visits  18    Date for PT Re-Evaluation  06/28/18    Authorization Type  medicare    Authorization Time Period  2/10    PT Start Time  1305    PT Stop Time  1350    PT Time Calculation (min)  45 min    Activity Tolerance  Patient tolerated treatment well    Behavior During Therapy  [facility] for tasks assessed/performed       Past Medical History:  Diagnosis Date  . Allergy   . Anemia   . Arthritis   . Atrial fibrillation ([place]) 2004  . Breast cancer, left ([place]) 10/2017   Lumpectomy and rad tx's.   Marland Kitchen CAD (coronary artery disease)   . Complication of anesthesia    hard to wake up with general anesthesia  . Diabetes ([place])   . Dysrhythmia    A-fib  . Heart attack ([place]) 2008  . Heart murmur   . History of kidney stones   . History of sick sinus syndrome    s/p pacemaker placement  . Hyperlipidemia   . Macular degeneration   . Neuropathy   . Neuropathy   . Pacemaker 06/2010  . Personal history of radiation therapy 11/2017   LEFT lumpectomy   . Squamous cell skin cancer   . TIA (transient ischemic attack)     Past Surgical History:  Procedure Laterality Date  . ABDOMINAL HYSTERECTOMY  1960's  . APPENDECTOMY  1947  . [place]  . BREAST BIOPSY Left 12/17/2016   SINGLE FRAGMENT OF ATYPICAL EPITHELIAL CELLS  . BREAST BIOPSY Left 04/08/2017  . BREAST EXCISIONAL BIOPSY Left 01/18/1986   Benign microcalcifications, [employer].  Marland Kitchen BREAST LUMPECTOMY Left 05/26/2017   13 mm,T1c, N0; ER/ PR+, Her 2 neu negative, HIGH RISK by Mammoprint ;  Surgeon:  [name], MD;  Location: [facility] ORS;  Service: General;  Laterality: Left;  . BREAST SURGERY  1988  . CARDIAC CATHETERIZATION  1989  . CATARACT EXTRACTION  1997 and 1998  . COLONOSCOPY WITH PROPOFOL N/A 09/12/2015   Procedure: COLONOSCOPY WITH PROPOFOL;  Surgeon: [name], MD;  Location: [facility] ENDOSCOPY;  Service: Endoscopy;  Laterality: N/A;  . KIDNEY STONE SURGERY  2018  . MOHS SURGERY  2019   forehead  . pace maker  2008  . [place]  . TONSILLECTOMY AND ADENOIDECTOMY  1950"s    There were no vitals filed for this visit.  Subjective Assessment - 05/11/18 1306    Subjective  Patient reports that she is doing pretty good today. No pain at rest. States after therapy she was tired and her shoulder bothered her a little bit.     Limitations  Lifting;Writing;House hold activities;Other (comment)    Patient Stated Goals  pain relief, return to PLOF    Currently in Pain?  No/denies    Pain Onset  More than a month ago      objective: TTP at L [facility] joint   Manual therapy: x61mins Superficial and IASTM to  L anterior delt, bicep, UT for tissue elasticity/pain management   Therapeutic exercise: Patient performed with instruction, verbal cues, tactile cues of therapist: goal: HEP instruction, to improve postural/ROM/relaxation   Posture corrections with scapular retractions with verbal/visual cues, 15x3sec holds with tactile cues throughout B/L shoulder ER  at door for posture correction with verbal/tactile cues 10x3" with cues to stay in pain free range with visual cues Isometric contractions for pain relief and strengthening, directions(ext/flex/ir/er/shoulder abduction, elbow flex/extend 10x3sec holds with PT assist and verbal/tactile cues Sitting shoulder flexion/abduction (L) table slides 12x3" in pain free range with demo/verbal cues from PT    Response to treatment: Patient demonstrates improved L shoulder ROM post session, pain with movement remains  unchanged.         PT Education - 05/11/18 1327    Education Details  therex    Person(s) Educated  Patient       PT Short Term Goals - 05/03/18 1521      PT SHORT TERM GOAL #1   Title  Patient will report adherence at least 3x a week to home exercise program to improve strength/mobility for better functional independence with ADLs.    Time  4    Period  Weeks    Status  New    Target Date  05/31/18        PT Long Term Goals - 05/03/18 1532      PT LONG TERM GOAL #1   Title  Patient will improve L shoulder AROM to equal to or 140 degrees of flexion, scaption, and abduction for improved ability to perform overhead activities.    Time  8    Period  Weeks    Status  New    Target Date  06/28/18      PT LONG TERM GOAL #2   Title  Patient will be able to perform household work/ chores with pain 3/10 or less.    Time  8    Period  Weeks    Status  New    Target Date  06/28/18      PT LONG TERM GOAL #3   Title   Patient will decrease Quick DASH score by > 8 points demonstrating reduced self-reported upper extremity disability.     Baseline  36 score, 56.82%    Time  8    Period  Weeks    Status  New    Target Date  06/28/18      PT LONG TERM GOAL #4   Title  Patient will improve shoulder external rotation in order to maintain hair care with pain 3/10 or less.    Time  8    Period  Weeks    Status  New    Target Date  06/28/18            Plan - 05/11/18 1327    Clinical Impression Statement  Patient demonstrated improved AAROM during session today compared to last session. Patient reports no pain at end of session, but feeling fatigued.    Clinical Impairments Affecting Rehab Potential  whole breast radiation to her left breast for stage I invasive mammary carcinoma ER/PR positive, pacemaker placement, new R lobe pulmonary nodule new since July, being watched. Decreased L UE use, weakness, pain with arm in varying position, reaching, carrying    PT  Frequency  2x / week    PT Duration  8 weeks    PT Treatment/Interventions  ADLs/Self Care Home Management;Neuromuscular re-education;Dry needling;Energy conservation;Functional  mobility training;Moist Heat;Aquatic Therapy;Therapeutic activities;Patient/family education;Cryotherapy;Balance training;Passive range of motion;Scar mobilization;Compression bandaging;Therapeutic exercise    PT Next Visit Plan  Address UT spasm, rotator cuff isometrics, postural training, scapular strengthening, AAROM    PT Home Exercise Plan  postural correction, diaphragmatic breathing, AROM ER/IR       Patient will benefit from skilled therapeutic intervention in order to improve the following deficits and impairments:  Pain, Improper body mechanics, Postural dysfunction, Impaired tone, Increased muscle spasms, Decreased mobility, Decreased activity tolerance, Decreased endurance, Decreased range of motion, Decreased strength, Hypomobility  Visit Diagnosis: Acute pain of left shoulder     Problem List Patient Active Problem List   Diagnosis Date Noted  . Vitamin D deficiency, unspecified 06/16/2017  . Hypercalcinuria 06/14/2017  . SCC (squamous cell carcinoma), face 03/29/2017  . History of kidney stones 03/12/2017  . Malignant neoplasm of upper-outer quadrant of left breast in female, estrogen receptor positive ([place]) 12/28/2016  . Chronic cystitis 11/05/2016  . Nephrolithiasis 11/05/2016  . Renal colic 56/31/4970  . Urge incontinence 11/05/2016  . Bilateral hand pain 11/03/2016  . Generalized osteoarthritis of hand 11/03/2016  . Numbness and tingling in both hands 11/03/2016  . Bradycardia 04/15/2016  . History of colonic polyps 09/16/2015  . Abdominal pain 08/18/2015  . Rectal bleeding 08/18/2015  . CAD (coronary artery disease) 02/17/2015  . A-fib ([place]) 02/17/2015  . Diabetes mellitus with neuropathy ([place]) 02/17/2015  . Hypercholesterolemia 02/17/2015  . Iron deficiency anemia 02/17/2015  .  Macular degeneration 02/17/2015  . Stress 02/17/2015  . Health care maintenance 02/17/2015  . Diarrhea 02/17/2015  . Hyperlipidemia, unspecified 07/13/2014  . Type II diabetes mellitus ([place]) 07/13/2014  . Hyperlipidemia due to type 2 diabetes mellitus ([place]) 07/13/2014  . Diabetic neuropathy ([place]) 03/31/2013  . Diverticulosis 03/31/2013  . GI bleeding 03/31/2013  . Mitral valve prolapse 03/31/2013  . Diverticulosis of intestine without perforation or abscess without bleeding 03/31/2013    Lieutenant Diego PT, DPT 2:06 PM,05/11/18 [place] [facility] [address] [place], [address] Phone: [phone]   Fax:  [phone]  Name: Holly Rose MRN: [id number] Date of Birth: 27-Jan-1930

## 2018-05-17 ENCOUNTER — Ambulatory Visit: Payer: Medicare Other | Attending: Radiation Oncology

## 2018-05-17 DIAGNOSIS — M25512 Pain in left shoulder: Secondary | ICD-10-CM | POA: Diagnosis not present

## 2018-05-17 NOTE — Therapy (Signed)
[place] [facility] [address] [place], [address] Phone: [phone]   Fax:  [phone]  Physical Therapy Treatment  Patient Details  Name: Holly Rose MRN: [id number] Date of Birth: 08-20-1930 Referring Provider: Noreene Filbert   Encounter Date: 05/17/2018  PT End of Session - 05/17/18 1431    Visit Number  4    Number of Visits  18    Date for PT Re-Evaluation  06/28/18    Authorization Type  medicare    Authorization Time Period  3/10    PT Start Time  1345    PT Stop Time  1430    PT Time Calculation (min)  45 min    Activity Tolerance  Patient tolerated treatment well    Behavior During Therapy  [facility] for tasks assessed/performed       Past Medical History:  Diagnosis Date  . Allergy   . Anemia   . Arthritis   . Atrial fibrillation ([place]) 2004  . Breast cancer, left ([place]) 10/2017   Lumpectomy and rad tx's.   Marland Kitchen CAD (coronary artery disease)   . Complication of anesthesia    hard to wake up with general anesthesia  . Diabetes ([place])   . Dysrhythmia    A-fib  . Heart attack ([place]) 2008  . Heart murmur   . History of kidney stones   . History of sick sinus syndrome    s/p pacemaker placement  . Hyperlipidemia   . Macular degeneration   . Neuropathy   . Neuropathy   . Pacemaker 06/2010  . Personal history of radiation therapy 11/2017   LEFT lumpectomy   . Squamous cell skin cancer   . TIA (transient ischemic attack)     Past Surgical History:  Procedure Laterality Date  . ABDOMINAL HYSTERECTOMY  1960's  . APPENDECTOMY  1947  . [place]  . BREAST BIOPSY Left 12/17/2016   SINGLE FRAGMENT OF ATYPICAL EPITHELIAL CELLS  . BREAST BIOPSY Left 04/08/2017  . BREAST EXCISIONAL BIOPSY Left 01/18/1986   Benign microcalcifications, [employer].  Marland Kitchen BREAST LUMPECTOMY Left 05/26/2017   13 mm,T1c, N0; ER/ PR+, Her 2 neu negative, HIGH RISK by Mammoprint ;  Surgeon:  [name], MD;  Location: [facility] ORS;  Service: General;  Laterality: Left;  . BREAST SURGERY  1988  . CARDIAC CATHETERIZATION  1989  . CATARACT EXTRACTION  1997 and 1998  . COLONOSCOPY WITH PROPOFOL N/A 09/12/2015   Procedure: COLONOSCOPY WITH PROPOFOL;  Surgeon: [name], MD;  Location: [facility] ENDOSCOPY;  Service: Endoscopy;  Laterality: N/A;  . KIDNEY STONE SURGERY  2018  . MOHS SURGERY  2019   forehead  . pace maker  2008  . [place]  . TONSILLECTOMY AND ADENOIDECTOMY  1950"s    There were no vitals filed for this visit.  Subjective Assessment - 05/17/18 1428    Subjective  Patient reports that she was busy over the weekend, but has not had a lot of pain. Current pain is in L anterior/lateral arm. Reports good compliance with HEP.    Currently in Pain?  Yes    Pain Score  3     Pain Location  Shoulder    Pain Orientation  Left    Pain Descriptors / Indicators  Aching    Pain Type  Acute pain    Pain Onset  More than a month ago  Objective: TTP at L AC joint, bicep/bicep tendon  Manual therapy: x67mins Superficial and IASTM to L anterior delt, bicep for tissue elasticity/pain management Therapeutic exercise: Patient performed with instruction, verbal cues, tactile cues of therapist: goal: HEP instruction, to improve postural/ROM/relaxation Posture corrections with scapular retractions with verbal/visual cues, 15x3sec holds with tactile cues?throughout B/L shoulder ER at door for posture correction with verbal/tactile cues 10x3" with cues to stay in pain free range with visual cues Isometric contractions for pain relief and strengthening, directions(ext/flex/ir/er/shoulder abduction, elbow flex/extend 10x3sec holds, elbow extended/ scapular plane with PT assist and verbal/tactile cues Supine AAROM of LUE x70mins  Sitting shoulder flexion/abduction (L) table slides 15x3" in pain free range with demo/verbal cues from PT Response to treatment:  Patient demonstrates improved L shoulder ROM post session, pain with movement remains unchanged.    PT Education - 05/17/18 1429    Education Details  therex technique and demonstration    Person(s) Educated  Patient    Methods  Explanation;Demonstration;Tactile cues    Comprehension  Verbalized understanding;Need further instruction       PT Short Term Goals - 05/03/18 1521      PT SHORT TERM GOAL #1   Title  Patient will report adherence at least 3x a week to home exercise program to improve strength/mobility for better functional independence with ADLs.    Time  4    Period  Weeks    Status  New    Target Date  05/31/18        PT Long Term Goals - 05/03/18 1532      PT LONG TERM GOAL #1   Title  Patient will improve L shoulder AROM to equal to or 140 degrees of flexion, scaption, and abduction for improved ability to perform overhead activities.    Time  8    Period  Weeks    Status  New    Target Date  06/28/18      PT LONG TERM GOAL #2   Title  Patient will be able to perform household work/ chores with pain 3/10 or less.    Time  8    Period  Weeks    Status  New    Target Date  06/28/18      PT LONG TERM GOAL #3   Title   Patient will decrease Quick DASH score by > 8 points demonstrating reduced self-reported upper extremity disability.     Baseline  36 score, 56.82%    Time  8    Period  Weeks    Status  New    Target Date  06/28/18      PT LONG TERM GOAL #4   Title  Patient will improve shoulder external rotation in order to maintain hair care with pain 3/10 or less.    Time  8    Period  Weeks    Status  New    Target Date  06/28/18            Plan - 05/17/18 1429    Clinical Impression Statement  Patient complains of pain with shoulder elevation greater than 100deg, and abduction greater than 90 (PROM and AAROM). Improvement in mobility noted post session, patient reported STM "felt good". Overall the patient would benefit from furhter skilled  PT to continue to progress towards goals.    Clinical Impairments Affecting Rehab Potential  whole breast radiation to her left breast for stage I invasive mammary carcinoma ER/PR positive, pacemaker placement, new  R lobe pulmonary nodule new since July, being watched. Decreased L UE use, weakness, pain with arm in varying position, reaching, carrying    PT Frequency  2x / week    PT Duration  8 weeks    PT Treatment/Interventions  ADLs/Self Care Home Management;Neuromuscular re-education;Dry needling;Energy conservation;Functional mobility training;Moist Heat;Aquatic Therapy;Therapeutic activities;Patient/family education;Cryotherapy;Balance training;Passive range of motion;Scar mobilization;Compression bandaging;Therapeutic exercise    PT Next Visit Plan  Address UT spasm, rotator cuff isometrics, postural training, scapular strengthening, AAROM    PT Home Exercise Plan  postural correction, diaphragmatic breathing, AROM ER/IR       Patient will benefit from skilled therapeutic intervention in order to improve the following deficits and impairments:  Pain, Improper body mechanics, Postural dysfunction, Impaired tone, Increased muscle spasms, Decreased mobility, Decreased activity tolerance, Decreased endurance, Decreased range of motion, Decreased strength, Hypomobility  Visit Diagnosis: Acute pain of left shoulder     Problem List Patient Active Problem List   Diagnosis Date Noted  . Vitamin D deficiency, unspecified 06/16/2017  . Hypercalcinuria 06/14/2017  . SCC (squamous cell carcinoma), face 03/29/2017  . History of kidney stones 03/12/2017  . Malignant neoplasm of upper-outer quadrant of left breast in female, estrogen receptor positive ([place]) 12/28/2016  . Chronic cystitis 11/05/2016  . Nephrolithiasis 11/05/2016  . Renal colic 50/05/3817  . Urge incontinence 11/05/2016  . Bilateral hand pain 11/03/2016  . Generalized osteoarthritis of hand 11/03/2016  . Numbness and tingling  in both hands 11/03/2016  . Bradycardia 04/15/2016  . History of colonic polyps 09/16/2015  . Abdominal pain 08/18/2015  . Rectal bleeding 08/18/2015  . CAD (coronary artery disease) 02/17/2015  . A-fib ([place]) 02/17/2015  . Diabetes mellitus with neuropathy ([place]) 02/17/2015  . Hypercholesterolemia 02/17/2015  . Iron deficiency anemia 02/17/2015  . Macular degeneration 02/17/2015  . Stress 02/17/2015  . Health care maintenance 02/17/2015  . Diarrhea 02/17/2015  . Hyperlipidemia, unspecified 07/13/2014  . Type II diabetes mellitus ([place]) 07/13/2014  . Hyperlipidemia due to type 2 diabetes mellitus ([place]) 07/13/2014  . Diabetic neuropathy ([place]) 03/31/2013  . Diverticulosis 03/31/2013  . GI bleeding 03/31/2013  . Mitral valve prolapse 03/31/2013  . Diverticulosis of intestine without perforation or abscess without bleeding 03/31/2013    Lieutenant Diego PT, DPT 2:33 PM,05/17/18 [place] [facility] [address], [place], [address] Phone: [phone]   Fax:  [phone]  Name: Holly Rose MRN: [id number] Date of Birth: 1930-07-07

## 2018-05-18 ENCOUNTER — Ambulatory Visit: Payer: Medicare Other

## 2018-05-18 DIAGNOSIS — M25512 Pain in left shoulder: Secondary | ICD-10-CM | POA: Diagnosis not present

## 2018-05-18 NOTE — Therapy (Signed)
[facility] [address] [place], [address] Phone: [phone]   Fax:  [phone]  Physical Therapy Treatment  Patient Details  Name: Holly Rose MRN: [id number] Date of Birth: [birth year] Referring Provider: Noreene Filbert   Encounter Date: 05/18/2018  PT End of Session - 05/18/18 1257    Visit Number  5    Number of Visits  18    Date for PT Re-Evaluation  06/28/18    Authorization Type  medicare    Authorization Time Period  4/10    PT Start Time  1257    PT Stop Time  1339    PT Time Calculation (min)  42 min    Activity Tolerance  Patient tolerated treatment well    Behavior During Therapy  [facility] for tasks assessed/performed       Past Medical History:  Diagnosis Date  . Allergy   . Anemia   . Arthritis   . Atrial fibrillation ([place]) 2004  . Breast cancer, left ([place]) 10/2017   Lumpectomy and rad tx's.   Marland Kitchen CAD (coronary artery disease)   . Complication of anesthesia    hard to wake up with general anesthesia  . Diabetes ([place])   . Dysrhythmia    A-fib  . Heart attack ([place]) 2008  . Heart murmur   . History of kidney stones   . History of sick sinus syndrome    s/p pacemaker placement  . Hyperlipidemia   . Macular degeneration   . Neuropathy   . Neuropathy   . Pacemaker 06/2010  . Personal history of radiation therapy 11/2017   LEFT lumpectomy   . Squamous cell skin cancer   . TIA (transient ischemic attack)     Past Surgical History:  Procedure Laterality Date  . ABDOMINAL HYSTERECTOMY  1960's  . APPENDECTOMY  1947  . [place]  . BREAST BIOPSY Left 12/17/2016   SINGLE FRAGMENT OF ATYPICAL EPITHELIAL CELLS  . BREAST BIOPSY Left 04/08/2017  . BREAST EXCISIONAL BIOPSY Left 01/18/1986   Benign microcalcifications, [employer].  Marland Kitchen BREAST LUMPECTOMY Left 05/26/2017   13 mm,T1c, N0; ER/ PR+, Her 2 neu negative, HIGH RISK by Mammoprint ;  Surgeon:  [name], MD;  Location: [facility] ORS;  Service: General;  Laterality: Left;  . BREAST SURGERY  1988  . CARDIAC CATHETERIZATION  1989  . CATARACT EXTRACTION  1997 and 1998  . COLONOSCOPY WITH PROPOFOL N/A 09/12/2015   Procedure: COLONOSCOPY WITH PROPOFOL;  Surgeon: [name], MD;  Location: [facility] ENDOSCOPY;  Service: Endoscopy;  Laterality: N/A;  . KIDNEY STONE SURGERY  2018  . MOHS SURGERY  2019   forehead  . pace maker  2008  . [place]  . TONSILLECTOMY AND ADENOIDECTOMY  1950"s    There were no vitals filed for this visit.  Subjective Assessment - 05/18/18 1340    Subjective  Patient reports that she is doing well today, states her pain is okay. No worse than yesterday.     Currently in Pain?  Yes    Pain Score  3     Pain Location  Shoulder    Pain Orientation  Left;Lateral;Medial    Pain Descriptors / Indicators  Aching    Pain Type  Acute pain    Pain Onset  More than a month ago       Objective: TTP to bicep tendon/bicep, rotator  cuff   Treatment: Manual therapy: x61mins  Superficial and IASTM to L anterior delt, bicep for tissue elasticity/pain management   Therapeutic exercise: Patient performed with instruction, verbal cues, tactile cues of therapist: goal: HEP instruction, to improve postural/ROM/relaxation   Posture corrections with scapular retractions with verbal/visual cues, 15x3sec holds with YTB with tactile cues?throughout  B/L shoulder ER at door with YTB for posture correction with verbal/tactile cues 15x3" with cues to stay in pain free range with visual cues  Isometric contractions for pain relief and strengthening, directions(ext/flex/ir/er/shoulder abduction, elbow flex/extend 10x3sec holds, elbow extended/ scapular plane with PT assist and verbal/tactile cues  Supine AAROM/PROM of LUE, improved ROM with slight distraction of L shoulder, improvement in pain and ROM with scapular retractions Standing shoulder flexion/abduction  (L) door slides 10x3" in pain free range with demo/verbal cues from PT  Doorway stretch on LUE 3x20"sec holds tactile/visual cues  Response to treatment: Patient demonstrates improved L shoulder ROM post session. Patient demonstrated improvements in AAROM/AROM during session today, able to elevate in supine past 110degs. Improvement in soft tissue integrity post IASTM as well by 25%.    PT Education - 05/18/18 1340    Education Details  Therex technique, importance of posture    Person(s) Educated  Patient    Methods  Explanation;Demonstration;Verbal cues    Comprehension  Verbalized understanding       PT Short Term Goals - 05/03/18 1521      PT SHORT TERM GOAL #1   Title  Patient will report adherence at least 3x a week to home exercise program to improve strength/mobility for better functional independence with ADLs.    Time  4    Period  Weeks    Status  New    Target Date  05/31/18        PT Long Term Goals - 05/03/18 1532      PT LONG TERM GOAL #1   Title  Patient will improve L shoulder AROM to equal to or 140 degrees of flexion, scaption, and abduction for improved ability to perform overhead activities.    Time  8    Period  Weeks    Status  New    Target Date  06/28/18      PT LONG TERM GOAL #2   Title  Patient will be able to perform household work/ chores with pain 3/10 or less.    Time  8    Period  Weeks    Status  New    Target Date  06/28/18      PT LONG TERM GOAL #3   Title   Patient will decrease Quick DASH score by > 8 points demonstrating reduced self-reported upper extremity disability.     Baseline  36 score, 56.82%    Time  8    Period  Weeks    Status  New    Target Date  06/28/18      PT LONG TERM GOAL #4   Title  Patient will improve shoulder external rotation in order to maintain hair care with pain 3/10 or less.    Time  8    Period  Weeks    Status  New    Target Date  06/28/18            Plan - 05/18/18 1341    Clinical  Impression Statement  Patient demonstrated improved ROM compared to last session. Greatest improvement with PROM/AAROM in supine with scapular retractions. Tolerated progression of  program well, minimal complaints of fatigue, decrease in pain at end of sessoin.    PT Frequency  2x / week    PT Duration  8 weeks    PT Treatment/Interventions  ADLs/Self Care Home Management;Neuromuscular re-education;Dry needling;Energy conservation;Functional mobility training;Moist Heat;Aquatic Therapy;Therapeutic activities;Patient/family education;Cryotherapy;Balance training;Passive range of motion;Scar mobilization;Compression bandaging;Therapeutic exercise    PT Next Visit Plan  Address UT spasm, rotator cuff isometrics, postural training, scapular strengthening, AAROM    PT Home Exercise Plan  postural correction, diaphragmatic breathing, AROM ER/IR    Consulted and Agree with Plan of Care  Patient       Patient will benefit from skilled therapeutic intervention in order to improve the following deficits and impairments:  Pain, Improper body mechanics, Postural dysfunction, Impaired tone, Increased muscle spasms, Decreased mobility, Decreased activity tolerance, Decreased endurance, Decreased range of motion, Decreased strength, Hypomobility  Visit Diagnosis: Acute pain of left shoulder     Problem List Patient Active Problem List   Diagnosis Date Noted  . Vitamin D deficiency, unspecified 06/16/2017  . Hypercalcinuria 06/14/2017  . SCC (squamous cell carcinoma), face 03/29/2017  . History of kidney stones 03/12/2017  . Malignant neoplasm of upper-outer quadrant of left breast in female, estrogen receptor positive ([place]) 12/28/2016  . Chronic cystitis 11/05/2016  . Nephrolithiasis 11/05/2016  . Renal colic 66/02/3015  . Urge incontinence 11/05/2016  . Bilateral hand pain 11/03/2016  . Generalized osteoarthritis of hand 11/03/2016  . Numbness and tingling in both hands 11/03/2016  . Bradycardia  04/15/2016  . History of colonic polyps 09/16/2015  . Abdominal pain 08/18/2015  . Rectal bleeding 08/18/2015  . CAD (coronary artery disease) 02/17/2015  . A-fib ([place]) 02/17/2015  . Diabetes mellitus with neuropathy ([place]) 02/17/2015  . Hypercholesterolemia 02/17/2015  . Iron deficiency anemia 02/17/2015  . Macular degeneration 02/17/2015  . Stress 02/17/2015  . Health care maintenance 02/17/2015  . Diarrhea 02/17/2015  . Hyperlipidemia, unspecified 07/13/2014  . Type II diabetes mellitus ([place]) 07/13/2014  . Hyperlipidemia due to type 2 diabetes mellitus ([place]) 07/13/2014  . Diabetic neuropathy ([place]) 03/31/2013  . Diverticulosis 03/31/2013  . GI bleeding 03/31/2013  . Mitral valve prolapse 03/31/2013  . Diverticulosis of intestine without perforation or abscess without bleeding 03/31/2013    Lieutenant Diego PT, DPT 1:43 PM,05/18/18 [facility] [address] [place], [address] Phone: [phone]   Fax:  [phone]  Name: MAYLA BIDDY MRN: [id number] Date of Birth: October 24, 1929

## 2018-05-19 ENCOUNTER — Ambulatory Visit: Payer: Medicare Other

## 2018-05-23 DIAGNOSIS — H353211 Exudative age-related macular degeneration, right eye, with active choroidal neovascularization: Secondary | ICD-10-CM | POA: Diagnosis not present

## 2018-05-24 ENCOUNTER — Other Ambulatory Visit: Payer: Self-pay | Admitting: Internal Medicine

## 2018-05-24 DIAGNOSIS — E1140 Type 2 diabetes mellitus with diabetic neuropathy, unspecified: Secondary | ICD-10-CM

## 2018-05-24 DIAGNOSIS — H353221 Exudative age-related macular degeneration, left eye, with active choroidal neovascularization: Secondary | ICD-10-CM | POA: Diagnosis not present

## 2018-05-25 ENCOUNTER — Ambulatory Visit (INDEPENDENT_AMBULATORY_CARE_PROVIDER_SITE_OTHER): Payer: Medicare Other | Admitting: Podiatry

## 2018-05-25 DIAGNOSIS — B351 Tinea unguium: Secondary | ICD-10-CM | POA: Diagnosis not present

## 2018-05-25 DIAGNOSIS — M79609 Pain in unspecified limb: Secondary | ICD-10-CM | POA: Diagnosis not present

## 2018-05-25 DIAGNOSIS — E1142 Type 2 diabetes mellitus with diabetic polyneuropathy: Secondary | ICD-10-CM

## 2018-05-25 DIAGNOSIS — L97521 Non-pressure chronic ulcer of other part of left foot limited to breakdown of skin: Secondary | ICD-10-CM | POA: Diagnosis not present

## 2018-05-25 NOTE — Progress Notes (Signed)
She presents today chief complaint of painful ulceration to the plantar aspect of the second digit right foot.  States that it seems to be doing better.  She like to have her toenails cut to.  All of her calluses trimmed also.  Objective: Ulcers remain palpable hammertoe deformity second left resulting in the distal clavus and skin breakdown.  Currently appears to be no erythema no cellulitis drainage or odor once debrided all reactive hyperkeratosis did demonstrate superficial ulceration measuring less than 4 mm in diameter.  No purulence no malodor.  Otherwise multiple reactive hyperkeratoses bilateral and elongated toenails 1 through 5 bilateral.  Assessment: Pain in limb secondary to onychomycosis hammertoe deformity with distal ulceration and calluses.  Plan: Debrided reactive hyperkeratosis debrided callus and debrided toenails.  Dry sterile dressing with Silvadene cream was placed on the ulcerative area.  Dressed with a dry sterile compressive dressing.  Follow-up with her in a few weeks

## 2018-05-27 ENCOUNTER — Encounter: Payer: Self-pay | Admitting: Family Medicine

## 2018-05-27 ENCOUNTER — Ambulatory Visit (INDEPENDENT_AMBULATORY_CARE_PROVIDER_SITE_OTHER): Payer: Medicare Other | Admitting: Family Medicine

## 2018-05-27 VITALS — BP 118/76 | HR 81 | Temp 97.8°F | Ht 62.0 in | Wt 157.6 lb

## 2018-05-27 DIAGNOSIS — Z23 Encounter for immunization: Secondary | ICD-10-CM | POA: Diagnosis not present

## 2018-05-27 DIAGNOSIS — N390 Urinary tract infection, site not specified: Secondary | ICD-10-CM

## 2018-05-27 LAB — POCT URINALYSIS DIPSTICK
Blood, UA: 10
Glucose, UA: NEGATIVE
Ketones, UA: 5
Nitrite, UA: POSITIVE
Protein, UA: POSITIVE — AB
Spec Grav, UA: >=1.03 — AB (ref 1.010–1.025)
Urobilinogen, UA: 1 E.U./dL
pH, UA: 5 (ref 5.0–8.0)

## 2018-05-27 MED ORDER — CEPHALEXIN 500 MG PO CAPS: 500.0000 mg | ORAL_CAPSULE | Freq: Two times a day (BID) | ORAL | 0 refills | Status: AC

## 2018-05-27 NOTE — Progress Notes (Signed)
Subjective:    Patient ID: Holly Rose, female    DOB: [birth year], 84 y.o.   MRN: [id number]  HPI  Patient presents to clinic with her daughter c/o burning with urination, urinary frequency and lower ABD pressure for 2 weeks. She has taken a few doses of AZO, not much help.  Denies nausea, vomiting, diarrhea, fever or chills.   Most recent lab work from August reviewed  [place] & Units 04/18/2018 08/03/2017 08/20/2016  WBC 3.4 - 10.8 x10E3/uL 4.7 6.2 5.7  Hemoglobin 11.1 - 15.9 g/dL 13.1 12.0 13.0  Hematocrit 34.0 - 46.6 % 39.3 36.7 41.2  Platelets 150 - 450 x10E3/uL 158 190 180   BMP Latest Ref Rng & Units 04/18/2018 08/20/2016 03/09/2016  Glucose 65 - 99 mg/dL 128(H) 244(H) 293(H)  BUN 8 - 27 mg/dL 9 10 13   Creatinine 0.57 - 1.00 mg/dL 0.63 0.63 0.71  BUN/Creat Ratio 12 - 28 14 16  -  Sodium 134 - 144 mmol/L 139 135 136  Potassium 3.5 - 5.2 mmol/L 4.3 4.3 4.5  Chloride 96 - 106 mmol/L 103 97 102  CO2 20 - 29 mmol/L 22 22 23   Calcium 8.7 - 10.3 mg/dL 10.3 10.5(H) 10.4   Hepatic Function Latest Ref Rng & Units 04/18/2018 08/20/2016 03/09/2016  Total Protein 6.0 - 8.5 g/dL 7.3 7.6 7.7  Albumin 3.5 - 4.7 g/dL 4.0 3.8 4.0  AST 0 - 40 IU/L 24 20 22   ALT 0 - 32 IU/L 18 21 23   Alk Phosphatase 39 - 117 IU/L 42 68 50  Total Bilirubin 0.0 - 1.2 mg/dL 0.3 0.5 0.4  Bilirubin, Direct 0.00 - 0.40 mg/dL 0.13 0.17 0.1     Patient Active Problem List   Diagnosis Date Noted  . Vitamin D deficiency, unspecified 06/16/2017  . Hypercalcinuria 06/14/2017  . SCC (squamous cell carcinoma), face 03/29/2017  . History of kidney stones 03/12/2017  . Malignant neoplasm of upper-outer quadrant of left breast in female, estrogen receptor positive ([place]) 12/28/2016  . Chronic cystitis 11/05/2016  . Nephrolithiasis 11/05/2016  . Renal colic 46/65/9935  . Urge incontinence 11/05/2016  . Bilateral hand pain 11/03/2016  . Generalized osteoarthritis of hand 11/03/2016  . Numbness and tingling  in both hands 11/03/2016  . Bradycardia 04/15/2016  . History of colonic polyps 09/16/2015  . Abdominal pain 08/18/2015  . Rectal bleeding 08/18/2015  . CAD (coronary artery disease) 02/17/2015  . A-fib ([place]) 02/17/2015  . Diabetes mellitus with neuropathy ([place]) 02/17/2015  . Hypercholesterolemia 02/17/2015  . Iron deficiency anemia 02/17/2015  . Macular degeneration 02/17/2015  . Stress 02/17/2015  . Health care maintenance 02/17/2015  . Diarrhea 02/17/2015  . Hyperlipidemia, unspecified 07/13/2014  . Type II diabetes mellitus ([place]) 07/13/2014  . Hyperlipidemia due to type 2 diabetes mellitus ([place]) 07/13/2014  . Diabetic neuropathy ([place]) 03/31/2013  . Diverticulosis 03/31/2013  . GI bleeding 03/31/2013  . Mitral valve prolapse 03/31/2013  . Diverticulosis of intestine without perforation or abscess without bleeding 03/31/2013   Social History   Tobacco Use  . Smoking status: Former Smoker    Years: 5.00    Last attempt to quit: 09/14/1968    Years since quitting: 49.7  . Smokeless tobacco: Never Used  . Tobacco comment: quit 1970  Substance Use Topics  . Alcohol use: No    Alcohol/week: 0.0 standard drinks   Review of Systems  Constitutional: Negative for chills, fatigue and fever.  HENT: Negative for congestion, ear pain, sinus pain and  sore throat.   Eyes: Negative.   Respiratory: Negative for cough, shortness of breath and wheezing.   Cardiovascular: Negative for chest pain, palpitations and leg swelling.  Gastrointestinal: +lower ABD pain. Negative for diarrhea, nausea and vomiting.  Genitourinary: +dysuria, frequency and urgency.  Musculoskeletal: Negative for arthralgias and myalgias.  Skin: Negative for color change, pallor and rash.  Neurological: Negative for syncope, light-headedness and headaches.  Psychiatric/Behavioral: The patient is not nervous/anxious.       Objective:   Physical Exam  Constitutional: She is oriented to person, place, and time. She  appears well-developed and well-nourished. No distress.  HENT:  Head: Normocephalic and atraumatic.  Eyes: No scleral icterus.  Neck: Neck supple. No tracheal deviation present.  Cardiovascular: Normal rate and regular rhythm.  Pulmonary/Chest: Effort normal and breath sounds normal. No respiratory distress.  Abdominal: Soft. Bowel sounds are normal. There is tenderness.  Mild suprapubic tenderness.   Neurological: She is alert and oriented to person, place, and time.  Skin: Skin is warm and dry. No pallor.  Psychiatric: She has a normal mood and affect. Her behavior is normal. Thought content normal.  Nursing note and vitals reviewed.  Vitals:   05/27/18 1110  BP: 118/76  Pulse: 81  Temp: 97.8 F (36.6 C)  SpO2: 92%      Assessment & Plan:   UTI/dysuria -- UA does have nitrites and leuks, but can be skewed due to AZO. Urine culture sent. We will treat with keflex BID for 5 days. Increase fluids, avoid excess sugar or caffeine. Rest & do good handwashing.   Keep regularly scheduled follow up as planned. Return to clinic sooner if symptoms persist or worsen.  Flu vaccine given

## 2018-05-29 LAB — URINE CULTURE
MICRO NUMBER:: 91100220
SPECIMEN QUALITY:: ADEQUATE

## 2018-05-30 ENCOUNTER — Ambulatory Visit: Payer: Medicare Other

## 2018-06-01 ENCOUNTER — Ambulatory Visit: Payer: Medicare Other | Admitting: Podiatry

## 2018-06-02 ENCOUNTER — Ambulatory Visit: Payer: Medicare Other

## 2018-06-06 ENCOUNTER — Ambulatory Visit: Payer: Medicare Other

## 2018-06-06 DIAGNOSIS — M25512 Pain in left shoulder: Secondary | ICD-10-CM | POA: Diagnosis not present

## 2018-06-06 NOTE — Therapy (Signed)
[facility] [address] [place], [address] Phone: [phone]   Fax:  [phone]  Physical Therapy Treatment  Patient Details  Name: Holly Rose MRN: [id number] Date of Birth: July 02, 1930 Referring Provider: Noreene Filbert   Encounter Date: 06/06/2018  PT End of Session - 06/06/18 1303    Visit Number  6    Number of Visits  18    Date for PT Re-Evaluation  06/28/18    Authorization Type  medicare    Authorization Time Period  5/10    PT Start Time  1305    PT Stop Time  1340    PT Time Calculation (min)  35 min    Activity Tolerance  Patient tolerated treatment well    Behavior During Therapy  [facility] for tasks assessed/performed       Past Medical History:  Diagnosis Date  . Allergy   . Anemia   . Arthritis   . Atrial fibrillation ([place]) 2004  . Breast cancer, left ([place]) 10/2017   Lumpectomy and rad tx's.   Marland Kitchen CAD (coronary artery disease)   . Complication of anesthesia    hard to wake up with general anesthesia  . Diabetes ([place])   . Dysrhythmia    A-fib  . Heart attack ([place]) 2008  . Heart murmur   . History of kidney stones   . History of sick sinus syndrome    s/p pacemaker placement  . Hyperlipidemia   . Macular degeneration   . Neuropathy   . Neuropathy   . Pacemaker 06/2010  . Personal history of radiation therapy 11/2017   LEFT lumpectomy   . Squamous cell skin cancer   . TIA (transient ischemic attack)     Past Surgical History:  Procedure Laterality Date  . ABDOMINAL HYSTERECTOMY  1960's  . APPENDECTOMY  1947  . [place]  . BREAST BIOPSY Left 12/17/2016   SINGLE FRAGMENT OF ATYPICAL EPITHELIAL CELLS  . BREAST BIOPSY Left 04/08/2017  . BREAST EXCISIONAL BIOPSY Left 01/18/1986   Benign microcalcifications, [employer].  Marland Kitchen BREAST LUMPECTOMY Left 05/26/2017   13 mm,T1c, N0; ER/ PR+, Her 2 neu negative, HIGH RISK by Mammoprint ;  Surgeon:  [name], MD;  Location: [facility] ORS;  Service: General;  Laterality: Left;  . BREAST SURGERY  1988  . CARDIAC CATHETERIZATION  1989  . CATARACT EXTRACTION  1997 and 1998  . COLONOSCOPY WITH PROPOFOL N/A 09/12/2015   Procedure: COLONOSCOPY WITH PROPOFOL;  Surgeon: [name], MD;  Location[facility] ENDOSCOPY;  Service: Endoscopy;  Laterality: N/A;  . KIDNEY STONE SURGERY  2018  . MOHS SURGERY  2019   forehead  . pace maker  2008  . [place]  . TONSILLECTOMY AND ADENOIDECTOMY  1950"s    Treatment: Shoulder Flexion finger Wall Walk - 10 reps - 1 sets B/L, verbal/visual cues for technique Standing Shoulder External Rotation with YTB 2x10  And visual cues Standing Shoulder Abduction Finger Walk at Wall - 10 reps - 1 sets  On L  Scapular Retraction with YTB- 10 reps - 2 sets with verbal/tactile cues  Scapular Retraction advanced with YTB - 10 reps - 2 sets  Supine Elbow Flexion Extension with 1# DB- 10 reps - 2 sets B/L Supine Shoulder Press with 1# DB - 10 reps - 2sets RS in supine of L shoulder, 3x15sec isometric hold  Response to treatment:  Patient had no significant complaints of pain during session. Improvement in exercise technique/form/pacing of activities with multimodal cues. HEP updated and reviewed with patient.    PT Education - 06/06/18 1315    Education Details  therex    Person(s) Educated  Patient    Methods  Explanation;Demonstration    Comprehension  Verbalized understanding       PT Short Term Goals - 06/06/18 1315      PT SHORT TERM GOAL #1   Time  3    Period  Weeks    Status  Partially Met    Target Date  06/28/18        PT Long Term Goals - 05/03/18 1532      PT LONG TERM GOAL #1   Title  Patient will improve L shoulder AROM to equal to or 140 degrees of flexion, scaption, and abduction for improved ability to perform overhead activities.    Time  8    Period  Weeks    Status  New    Target Date  06/28/18      PT LONG  TERM GOAL #2   Title  Patient will be able to perform household work/ chores with pain 3/10 or less.    Time  8    Period  Weeks    Status  New    Target Date  06/28/18      PT LONG TERM GOAL #3   Title   Patient will decrease Quick DASH score by > 8 points demonstrating reduced self-reported upper extremity disability.     Baseline  36 score, 56.82%    Time  8    Period  Weeks    Status  New    Target Date  06/28/18      PT LONG TERM GOAL #4   Title  Patient will improve shoulder external rotation in order to maintain hair care with pain 3/10 or less.    Time  8    Period  Weeks    Status  New    Target Date  06/28/18            Plan - 06/06/18 1354    Clinical Impression Statement  Patient had minimal to no complaints of pain during session today. States she is performing all activities she wants to be able to do, except for forward elevation of L shoulder, especially with resistance. PT and patient also took time today to discuss patient's potential for PT to focus on gait and balance to promote safety at home and prevent future falls.     PT Frequency  2x / week    PT Duration  8 weeks    PT Treatment/Interventions  ADLs/Self Care Home Management;Neuromuscular re-education;Dry needling;Energy conservation;Functional mobility training;Moist Heat;Aquatic Therapy;Therapeutic activities;Patient/family education;Cryotherapy;Balance training;Passive range of motion;Scar mobilization;Compression bandaging;Therapeutic exercise       Patient will benefit from skilled therapeutic intervention in order to improve the following deficits and impairments:  Pain, Improper body mechanics, Postural dysfunction, Impaired tone, Increased muscle spasms, Decreased mobility, Decreased activity tolerance, Decreased endurance, Decreased range of motion, Decreased strength, Hypomobility  Visit Diagnosis: Acute pain of left shoulder     Problem List Patient Active Problem List   Diagnosis  Date Noted  . Vitamin D deficiency, unspecified 06/16/2017  . Hypercalcinuria 06/14/2017  . SCC (squamous cell carcinoma), face 03/29/2017  . History of kidney stones 03/12/2017  . Malignant neoplasm of upper-outer quadrant of left breast in female, estrogen receptor positive (  [place]) 12/28/2016  . Chronic cystitis 11/05/2016  . Nephrolithiasis 11/05/2016  . Renal colic 03/09/9484  . Urge incontinence 11/05/2016  . Bilateral hand pain 11/03/2016  . Generalized osteoarthritis of hand 11/03/2016  . Numbness and tingling in both hands 11/03/2016  . Bradycardia 04/15/2016  . History of colonic polyps 09/16/2015  . Abdominal pain 08/18/2015  . Rectal bleeding 08/18/2015  . CAD (coronary artery disease) 02/17/2015  . A-fib ([place]) 02/17/2015  . Diabetes mellitus with neuropathy ([place]) 02/17/2015  . Hypercholesterolemia 02/17/2015  . Iron deficiency anemia 02/17/2015  . Macular degeneration 02/17/2015  . Stress 02/17/2015  . Health care maintenance 02/17/2015  . Diarrhea 02/17/2015  . Hyperlipidemia, unspecified 07/13/2014  . Type II diabetes mellitus ([place]) 07/13/2014  . Hyperlipidemia due to type 2 diabetes mellitus ([place]) 07/13/2014  . Diabetic neuropathy ([place]) 03/31/2013  . Diverticulosis 03/31/2013  . GI bleeding 03/31/2013  . Mitral valve prolapse 03/31/2013  . Diverticulosis of intestine without perforation or abscess without bleeding 03/31/2013   Lieutenant Diego PT, DPT 1:56 PM,06/06/18 [facility] [address], [place], [address] Phone: [phone]   Fax:  [phone]  Name: MACIEL KEGG MRN: [id number] Date of Birth: [birth year]

## 2018-06-06 NOTE — Patient Instructions (Signed)
Access Code: [email or web address]  URL: [email or web address]  Date: 06/06/2018  Prepared by: Lieutenant Diego   Exercises  Correct Seated Posture - 10 reps - 1 sets - 3 hold - 2x daily - 7x weekly  Doorway Pec Stretch at 90 Degrees Abduction - 3 reps - 1 sets - 1x daily - 3x weekly  Shoulder Flexion Wall Walk - 10 reps - 1 sets - 1x daily - 3x weekly  Standing Shoulder External Rotation with Resistance - 10 reps - 1 sets - 1x daily - 3x weekly  Standing Shoulder Abduction Finger Walk at Wall - 10 reps - 1 sets - 1x daily - 3x weekly  Scapular Retraction with Resistance - 15 reps - 1 sets - 1x daily - 3x weekly  Supine Elbow Flexion Extension AROM - 10 reps - 1 sets - 1x daily - 3x weekly  Supine Shoulder Press - 10 reps - 1 sets - 1x daily - 3x weekly

## 2018-06-09 ENCOUNTER — Ambulatory Visit: Payer: Medicare Other

## 2018-06-09 DIAGNOSIS — M25512 Pain in left shoulder: Secondary | ICD-10-CM

## 2018-06-09 NOTE — Therapy (Signed)
[facility] [address], [place], [address] Phone: [phone]   Fax:  [phone]  Physical Therapy Treatment  Patient Details  Name: Holly Rose MRN: [id number] Date of Birth: 07/25/1930 Referring Provider (PT): Noreene Filbert   Encounter Date: 06/09/2018  PT End of Session - 06/09/18 1353    Visit Number  7    Number of Visits  18    Date for PT Re-Evaluation  06/28/18    Authorization Type  medicare    Authorization Time Period  6/10    PT Start Time  1355    PT Stop Time  1430    PT Time Calculation (min)  35 min    Activity Tolerance  Patient tolerated treatment well    Behavior During Therapy  [facility] for tasks assessed/performed       Past Medical History:  Diagnosis Date  . Allergy   . Anemia   . Arthritis   . Atrial fibrillation ([place]) 2004  . Breast cancer, left ([place]) 10/2017   Lumpectomy and rad tx's.   Marland Kitchen CAD (coronary artery disease)   . Complication of anesthesia    hard to wake up with general anesthesia  . Diabetes ([place])   . Dysrhythmia    A-fib  . Heart attack ([place]) 2008  . Heart murmur   . History of kidney stones   . History of sick sinus syndrome    s/p pacemaker placement  . Hyperlipidemia   . Macular degeneration   . Neuropathy   . Neuropathy   . Pacemaker 06/2010  . Personal history of radiation therapy 11/2017   LEFT lumpectomy   . Squamous cell skin cancer   . TIA (transient ischemic attack)     Past Surgical History:  Procedure Laterality Date  . ABDOMINAL HYSTERECTOMY  1960's  . APPENDECTOMY  1947  . [place]  . BREAST BIOPSY Left 12/17/2016   SINGLE FRAGMENT OF ATYPICAL EPITHELIAL CELLS  . BREAST BIOPSY Left 04/08/2017  . BREAST EXCISIONAL BIOPSY Left 01/18/1986   Benign microcalcifications, [employer].  Marland Kitchen BREAST LUMPECTOMY Left 05/26/2017   13 mm,T1c, N0; ER/ PR+, Her 2 neu negative, HIGH RISK by Mammoprint ;   Surgeon: [name], MD;  Location: [facility] ORS;  Service: General;  Laterality: Left;  . BREAST SURGERY  1988  . CARDIAC CATHETERIZATION  1989  . CATARACT EXTRACTION  1997 and 1998  . COLONOSCOPY WITH PROPOFOL N/A 09/12/2015   Procedure: COLONOSCOPY WITH PROPOFOL;  Surgeon: [name], MD;  Location: [name] ENDOSCOPY;  Service: Endoscopy;  Laterality: N/A;  . KIDNEY STONE SURGERY  2018  . MOHS SURGERY  2019   forehead  . pace maker  2008  . [place]  . TONSILLECTOMY AND ADENOIDECTOMY  1950"s    There were no vitals filed for this visit.  Subjective Assessment - 06/09/18 1353    Subjective  Patient reports that she has some mild pain today. Reports doing a few exercises at home.    Currently in Pain?  Yes    Pain Score  3     Pain Orientation  Left    Pain Descriptors / Indicators  Aching    Pain Onset  More than a month ago       Treatment:  Shoulder Flexion finger Wall Walk - 12 reps - 1 sets B/L, verbal/visual cues for technique Standing Shoulder External Rotation  with RTB 2x10  And visual cues Standing Shoulder Abduction Finger Walk at Wall - 12 reps - 1 sets  On L  Scapular Retraction with RTB- 10 reps - 2 sets with verbal/tactile cues  Scapular Retraction advanced with RTB - 10 reps - 2 sets  Supine Elbow Flexion Extension with 1# DB- 10 reps - 2 sets B/L Supine Shoulder Press with 2# DB - 10 reps - 2sets Supine shoulder flexion AROM x15  Doorway stretch b/l 3x30s RS in supine of L shoulder, 3x15sec isometric hold in flex/extend, add/abd  Response to treatment:Patient had no significant complaints of pain during session. Improvement in exercise technique/form/pacing of activities with multimodal cues.     PT Education - 06/09/18 1352    Education Details  therex technique/form    Person(s) Educated  Patient    Methods  Explanation;Demonstration;Verbal cues;Tactile cues    Comprehension  Verbalized understanding       PT Short Term  Goals - 06/06/18 1315      PT SHORT TERM GOAL #1   Time  3    Period  Weeks    Status  Partially Met    Target Date  06/28/18        PT Long Term Goals - 05/03/18 1532      PT LONG TERM GOAL #1   Title  Patient will improve L shoulder AROM to equal to or 140 degrees of flexion, scaption, and abduction for improved ability to perform overhead activities.    Time  8    Period  Weeks    Status  New    Target Date  06/28/18      PT LONG TERM GOAL #2   Title  Patient will be able to perform household work/ chores with pain 3/10 or less.    Time  8    Period  Weeks    Status  New    Target Date  06/28/18      PT LONG TERM GOAL #3   Title   Patient will decrease Quick DASH score by > 8 points demonstrating reduced self-reported upper extremity disability.     Baseline  36 score, 56.82%    Time  8    Period  Weeks    Status  New    Target Date  06/28/18      PT LONG TERM GOAL #4   Title  Patient will improve shoulder external rotation in order to maintain hair care with pain 3/10 or less.    Time  8    Period  Weeks    Status  New    Target Date  06/28/18            Plan - 06/09/18 1424    Clinical Impression Statement  Patient unable to perform supine shoulder flexion with 2# weight due to weakness and mild pain. Much improved form with no weights. Overall patient progressing well with therapy.     PT Frequency  2x / week    PT Duration  8 weeks    PT Treatment/Interventions  ADLs/Self Care Home Management;Neuromuscular re-education;Dry needling;Energy conservation;Functional mobility training;Moist Heat;Aquatic Therapy;Therapeutic activities;Patient/family education;Cryotherapy;Balance training;Passive range of motion;Scar mobilization;Compression bandaging;Therapeutic exercise    Consulted and Agree with Plan of Care  Patient       Patient will benefit from skilled therapeutic intervention in order to improve the following deficits and impairments:  Pain, Improper  body mechanics, Postural dysfunction, Impaired tone, Increased muscle spasms, Decreased mobility, Decreased activity  tolerance, Decreased endurance, Decreased range of motion, Decreased strength, Hypomobility  Visit Diagnosis: Acute pain of left shoulder     Problem List Patient Active Problem List   Diagnosis Date Noted  . Vitamin D deficiency, unspecified 06/16/2017  . Hypercalcinuria 06/14/2017  . SCC (squamous cell carcinoma), face 03/29/2017  . History of kidney stones 03/12/2017  . Malignant neoplasm of upper-outer quadrant of left breast in female, estrogen receptor positive ([place]) 12/28/2016  . Chronic cystitis 11/05/2016  . Nephrolithiasis 11/05/2016  . Renal colic 05/69/7948  . Urge incontinence 11/05/2016  . Bilateral hand pain 11/03/2016  . Generalized osteoarthritis of hand 11/03/2016  . Numbness and tingling in both hands 11/03/2016  . Bradycardia 04/15/2016  . History of colonic polyps 09/16/2015  . Abdominal pain 08/18/2015  . Rectal bleeding 08/18/2015  . CAD (coronary artery disease) 02/17/2015  . A-fib ([place]) 02/17/2015  . Diabetes mellitus with neuropathy ([place]) 02/17/2015  . Hypercholesterolemia 02/17/2015  . Iron deficiency anemia 02/17/2015  . Macular degeneration 02/17/2015  . Stress 02/17/2015  . Health care maintenance 02/17/2015  . Diarrhea 02/17/2015  . Hyperlipidemia, unspecified 07/13/2014  . Type II diabetes mellitus ([place]) 07/13/2014  . Hyperlipidemia due to type 2 diabetes mellitus ([place]) 07/13/2014  . Diabetic neuropathy ([place]) 03/31/2013  . Diverticulosis 03/31/2013  . GI bleeding 03/31/2013  . Mitral valve prolapse 03/31/2013  . Diverticulosis of intestine without perforation or abscess without bleeding 03/31/2013    Lieutenant Diego PT, DPT 2:28 PM,06/09/18 [facility] [address], [place], [address] Phone: [phone]   Fax:  [phone]  Name:  Holly Rose MRN: [id number] Date of Birth: 1930/02/10

## 2018-06-10 ENCOUNTER — Ambulatory Visit (INDEPENDENT_AMBULATORY_CARE_PROVIDER_SITE_OTHER): Payer: Medicare Other | Admitting: Family Medicine

## 2018-06-10 ENCOUNTER — Encounter: Payer: Self-pay | Admitting: Family Medicine

## 2018-06-10 ENCOUNTER — Encounter: Payer: Self-pay | Admitting: Lab

## 2018-06-10 VITALS — BP 122/78 | HR 74 | Temp 97.8°F | Ht 62.0 in | Wt 159.6 lb

## 2018-06-10 DIAGNOSIS — N390 Urinary tract infection, site not specified: Secondary | ICD-10-CM | POA: Diagnosis not present

## 2018-06-10 DIAGNOSIS — R8290 Unspecified abnormal findings in urine: Secondary | ICD-10-CM

## 2018-06-10 DIAGNOSIS — R350 Frequency of micturition: Secondary | ICD-10-CM | POA: Diagnosis not present

## 2018-06-10 LAB — POCT URINALYSIS DIPSTICK
Bilirubin, UA: NEGATIVE
Blood, UA: NEGATIVE
Glucose, UA: NEGATIVE
Ketones, UA: NEGATIVE
Nitrite, UA: NEGATIVE
Protein, UA: POSITIVE — AB
Spec Grav, UA: >=1.03 — AB (ref 1.010–1.025)
Urobilinogen, UA: 0.2 E.U./dL
pH, UA: 6 (ref 5.0–8.0)

## 2018-06-10 MED ORDER — CEFTRIAXONE SODIUM 1 G IJ SOLR: 1.0000 g | Freq: Once | INTRAMUSCULAR | Status: DC

## 2018-06-10 MED ORDER — CEFTRIAXONE SODIUM 1 G IJ SOLR
1.0000 g | Freq: Once | INTRAMUSCULAR | Status: AC
  Administered 2018-06-10: 1 g via INTRAMUSCULAR

## 2018-06-10 MED ORDER — CIPROFLOXACIN HCL 500 MG PO TABS: 500.0000 mg | ORAL_TABLET | Freq: Two times a day (BID) | ORAL | 0 refills | Status: AC

## 2018-06-10 NOTE — Progress Notes (Signed)
Subjective:    Patient ID: Holly Rose, female    DOB: [birth year], 84 y.o.   MRN: [id number]  HPI   Patient presents to clinic with urinary symptoms including frequency, urgency, foul-smelling urine.  Patient was treated with course of Keflex 500 mg twice daily for 5 days.  Urine culture did show this medication to be sensitive.  Patient denies any fever or chills.  Denies nausea, vomiting or diarrhea.   Patient Active Problem List   Diagnosis Date Noted  . Vitamin D deficiency, unspecified 06/16/2017  . Hypercalcinuria 06/14/2017  . SCC (squamous cell carcinoma), face 03/29/2017  . History of kidney stones 03/12/2017  . Malignant neoplasm of upper-outer quadrant of left breast in female, estrogen receptor positive ([place]) 12/28/2016  . Chronic cystitis 11/05/2016  . Nephrolithiasis 11/05/2016  . Renal colic 40/06/2724  . Urge incontinence 11/05/2016  . Bilateral hand pain 11/03/2016  . Generalized osteoarthritis of hand 11/03/2016  . Numbness and tingling in both hands 11/03/2016  . Bradycardia 04/15/2016  . History of colonic polyps 09/16/2015  . Abdominal pain 08/18/2015  . Rectal bleeding 08/18/2015  . CAD (coronary artery disease) 02/17/2015  . A-fib ([place]) 02/17/2015  . Diabetes mellitus with neuropathy ([place]) 02/17/2015  . Hypercholesterolemia 02/17/2015  . Iron deficiency anemia 02/17/2015  . Macular degeneration 02/17/2015  . Stress 02/17/2015  . Health care maintenance 02/17/2015  . Diarrhea 02/17/2015  . Hyperlipidemia, unspecified 07/13/2014  . Type II diabetes mellitus ([place]) 07/13/2014  . Hyperlipidemia due to type 2 diabetes mellitus ([place]) 07/13/2014  . Diabetic neuropathy ([place]) 03/31/2013  . Diverticulosis 03/31/2013  . GI bleeding 03/31/2013  . Mitral valve prolapse 03/31/2013  . Diverticulosis of intestine without perforation or abscess without bleeding 03/31/2013   Social History   Tobacco Use  . Smoking status: Former Smoker    Years: 5.00   Last attempt to quit: 09/14/1968    Years since quitting: 49.7  . Smokeless tobacco: Never Used  . Tobacco comment: quit 1970  Substance Use Topics  . Alcohol use: No    Alcohol/week: 0.0 standard drinks    Review of Systems   Constitutional: Negative for chills, fatigue and fever.  HENT: Negative for congestion, ear pain, sinus pain and sore throat.   Eyes: Negative.   Respiratory: Negative for cough, shortness of breath and wheezing.   Cardiovascular: Negative for chest pain, palpitations and leg swelling.  Gastrointestinal: Negative for abdominal pain, diarrhea, nausea and vomiting.  Genitourinary: positive for dysuria, frequency and urgency.  Musculoskeletal: Negative for arthralgias and myalgias.  Skin: Negative for color change, pallor and rash.  Neurological: Negative for syncope, light-headedness and headaches.  Psychiatric/Behavioral: The patient is not nervous/anxious.       Objective:   Physical Exam  Constitutional: She is oriented to person, place, and time. She appears well-nourished. No distress.  HENT:  Head: Normocephalic and atraumatic.  Eyes: No scleral icterus.  Neck: Neck supple. No tracheal deviation present.  Cardiovascular: Normal rate and regular rhythm.  Pulmonary/Chest: Breath sounds normal. No respiratory distress.  Abdominal: Soft. Bowel sounds are normal. There is tenderness.  +suprapubic tenderness.   Musculoskeletal: She exhibits no edema.  Gait normal.   Neurological: She is alert and oriented to person, place, and time.  Skin: Skin is warm and dry. No pallor.  Psychiatric: She has a normal mood and affect. Her behavior is normal.  Nursing note and vitals reviewed.   Vitals:   06/10/18 0911  BP: 122/78  Pulse: 74  Temp: 97.8 F (36.6 C)  SpO2: 91%      Assessment & Plan:    Urinary frequency/UTI/foul smelling urine -- patient's urinalysis shows 3+ bacteria, urine is foul-smelling, and has some protein in urine.  We will give  patient one-time IM injection of Rocephin 1 g in clinic, she will also take 5 days of Cipro twice daily.  Cipro was also shown to be sensitive on her urine culture that was done on May 27, 2018.  Urine sample collected today also was sent to lab for culture.  Patient and daughter aware to return to clinic if symptoms do not improve.   She will keep regularly scheduled follow up at this time.

## 2018-06-13 ENCOUNTER — Ambulatory Visit: Payer: Medicare Other

## 2018-06-13 DIAGNOSIS — M25512 Pain in left shoulder: Secondary | ICD-10-CM | POA: Diagnosis not present

## 2018-06-13 LAB — URINE CULTURE
MICRO NUMBER:: 91164252
SPECIMEN QUALITY:: ADEQUATE

## 2018-06-13 NOTE — Therapy (Signed)
[facility] [address] [place], [address] Phone: [phone]   Fax:  [phone]  Physical Therapy Treatment  Patient Details  Name: Holly Rose MRN: [id number] Date of Birth: 1929-12-07 Referring Provider (PT): Noreene Filbert   Encounter Date: 06/13/2018  PT End of Session - 06/13/18 1259    Visit Number  8    Number of Visits  18    Date for PT Re-Evaluation  06/28/18    Authorization Type  medicare    Authorization Time Period  7/10    PT Start Time  1300    PT Stop Time  1342    PT Time Calculation (min)  42 min    Activity Tolerance  Patient tolerated treatment well    Behavior During Therapy  [facility] for tasks assessed/performed       Past Medical History:  Diagnosis Date  . Allergy   . Anemia   . Arthritis   . Atrial fibrillation ([place]) 2004  . Breast cancer, left ([place]) 10/2017   Lumpectomy and rad tx's.   Marland Kitchen CAD (coronary artery disease)   . Complication of anesthesia    hard to wake up with general anesthesia  . Diabetes ([place])   . Dysrhythmia    A-fib  . Heart attack ([place]) 2008  . Heart murmur   . History of kidney stones   . History of sick sinus syndrome    s/p pacemaker placement  . Hyperlipidemia   . Macular degeneration   . Neuropathy   . Neuropathy   . Pacemaker 06/2010  . Personal history of radiation therapy 11/2017   LEFT lumpectomy   . Squamous cell skin cancer   . TIA (transient ischemic attack)     Past Surgical History:  Procedure Laterality Date  . ABDOMINAL HYSTERECTOMY  1960's  . APPENDECTOMY  1947  . [place]  . BREAST BIOPSY Left 12/17/2016   SINGLE FRAGMENT OF ATYPICAL EPITHELIAL CELLS  . BREAST BIOPSY Left 04/08/2017  . BREAST EXCISIONAL BIOPSY Left 01/18/1986   Benign microcalcifications, [employer].  Marland Kitchen BREAST LUMPECTOMY Left 05/26/2017   13 mm,T1c, N0; ER/ PR+, Her 2 neu negative, HIGH RISK by Mammoprint ;   Surgeon: [name], MD;  Location: [facility] ORS;  Service: General;  Laterality: Left;  . BREAST SURGERY  1988  . CARDIAC CATHETERIZATION  1989  . CATARACT EXTRACTION  1997 and 1998  . COLONOSCOPY WITH PROPOFOL N/A 09/12/2015   Procedure: COLONOSCOPY WITH PROPOFOL;  Surgeon: [name], MD;  Location: [facility] ENDOSCOPY;  Service: Endoscopy;  Laterality: N/A;  . KIDNEY STONE SURGERY  2018  . MOHS SURGERY  2019   forehead  . pace maker  2008  . [place]  . TONSILLECTOMY AND ADENOIDECTOMY  1950"s    There were no vitals filed for this visit.  Subjective Assessment - 06/13/18 1302    Subjective  Patient reports no pain at start of session. Reports that she was with her grandchildren over the weekend it hurt a little.    Currently in Pain?  No/denies       Treatment:  Shoulder FlexionfingerWall Walk - 15 reps - 1 setsB/L, verbal/visual cues for technique Standing Shoulder External Rotation withGTB with pillow behind head for posture/scapular retraction 2x10 And visual cues Standing Shoulder Abduction Finger Walk at Wall - 15 reps - 1 setsOn L Scapular Retraction withGTB- 10 reps -  2 setswith verbal/tactile cues Scapular Retractionadvanced with RTB- 10 reps - 2 sets  Supine Elbow Flexion Extensionwith 2# DB- 10 reps - 2 setsB/L Supine Shoulder Presswith 2# DB- 15 reps -2sets Supine shoulder flexion/abduction on L AROM x15  Doorway stretch b/l 3x30s RS in supine of L shoulder, 3x15sec isometric hold in flex/extend, add/abd Seated b/l  shoulder flexion in scapular plane x10   Response to treatment:Patient had no significant complaints of pain during session. Improvement in exercise technique/form/pacing of activities with verbal/visual cues.     PT Education - 06/13/18 1303    Education Details  therex technique/form    Person(s) Educated  Patient    Methods  Explanation;Demonstration    Comprehension  Verbalized understanding        PT Short Term Goals - 06/06/18 1315      PT SHORT TERM GOAL #1   Time  3    Period  Weeks    Status  Partially Met    Target Date  06/28/18        PT Long Term Goals - 05/03/18 1532      PT LONG TERM GOAL #1   Title  Patient will improve L shoulder AROM to equal to or 140 degrees of flexion, scaption, and abduction for improved ability to perform overhead activities.    Time  8    Period  Weeks    Status  New    Target Date  06/28/18      PT LONG TERM GOAL #2   Title  Patient will be able to perform household work/ chores with pain 3/10 or less.    Time  8    Period  Weeks    Status  New    Target Date  06/28/18      PT LONG TERM GOAL #3   Title   Patient will decrease Quick DASH score by > 8 points demonstrating reduced self-reported upper extremity disability.     Baseline  36 score, 56.82%    Time  8    Period  Weeks    Status  New    Target Date  06/28/18      PT LONG TERM GOAL #4   Title  Patient will improve shoulder external rotation in order to maintain hair care with pain 3/10 or less.    Time  8    Period  Weeks    Status  New    Target Date  06/28/18            Plan - 06/13/18 1342    Clinical Impression Statement  Patient has difficulty with shoulder flexion with 1# weight due to weakness, able to complete multiple repetitions with no weight and no pain. Patient continues to progress towards goals and would benefit from HEP update next session.     PT Frequency  2x / week    PT Duration  8 weeks    PT Treatment/Interventions  ADLs/Self Care Home Management;Neuromuscular re-education;Dry needling;Energy conservation;Functional mobility training;Moist Heat;Aquatic Therapy;Therapeutic activities;Patient/family education;Cryotherapy;Balance training;Passive range of motion;Scar mobilization;Compression bandaging;Therapeutic exercise    PT Next Visit Plan  HEP update, discharge?    Consulted and Agree with Plan of Care  Patient       Patient  will benefit from skilled therapeutic intervention in order to improve the following deficits and impairments:  Pain, Improper body mechanics, Postural dysfunction, Impaired tone, Increased muscle spasms, Decreased mobility, Decreased activity tolerance, Decreased endurance, Decreased range of motion, Decreased strength, Hypomobility  Visit Diagnosis: Acute pain of left shoulder     Problem List Patient Active Problem List   Diagnosis Date Noted  . Vitamin D deficiency, unspecified 06/16/2017  . Hypercalcinuria 06/14/2017  . SCC (squamous cell carcinoma), face 03/29/2017  . History of kidney stones 03/12/2017  . Malignant neoplasm of upper-outer quadrant of left breast in female, estrogen receptor positive ([place]) 12/28/2016  . Chronic cystitis 11/05/2016  . Nephrolithiasis 11/05/2016  . Renal colic 23/53/6144  . Urge incontinence 11/05/2016  . Bilateral hand pain 11/03/2016  . Generalized osteoarthritis of hand 11/03/2016  . Numbness and tingling in both hands 11/03/2016  . Bradycardia 04/15/2016  . History of colonic polyps 09/16/2015  . Abdominal pain 08/18/2015  . Rectal bleeding 08/18/2015  . CAD (coronary artery disease) 02/17/2015  . A-fib ([place]) 02/17/2015  . Diabetes mellitus with neuropathy ([place]) 02/17/2015  . Hypercholesterolemia 02/17/2015  . Iron deficiency anemia 02/17/2015  . Macular degeneration 02/17/2015  . Stress 02/17/2015  . Health care maintenance 02/17/2015  . Diarrhea 02/17/2015  . Hyperlipidemia, unspecified 07/13/2014  . Type II diabetes mellitus ([place]) 07/13/2014  . Hyperlipidemia due to type 2 diabetes mellitus ([place]) 07/13/2014  . Diabetic neuropathy ([place]) 03/31/2013  . Diverticulosis 03/31/2013  . GI bleeding 03/31/2013  . Mitral valve prolapse 03/31/2013  . Diverticulosis of intestine without perforation or abscess without bleeding 03/31/2013   Lieutenant Diego PT, DPT 1:44 PM,06/13/18 [place] [place]  [facility] [address], [place], [address] Phone: [phone]   Fax:  [phone]  Name: LEENA TIEDE MRN: [id number] Date of Birth: November 25, 1929

## 2018-06-14 ENCOUNTER — Telehealth: Payer: Self-pay | Admitting: Podiatry

## 2018-06-14 NOTE — Telephone Encounter (Signed)
Patient called to schedule an appointment, and wanted to be transferred to the [facility] office. I told pt that, I could help her, schedule the appointment. I tried to explain that this is the Call Center and we take all the call but, Patient was very upset and said "no you can not help m , I will just hang the phone up on you". Patient immediately [place] the phone up. About 5 minutes later, patients Husband called and he was upset. [name], said that we were giving his Wife a hard time, and hes a very good friend of [name]. I didn't even try to explain how the call center works to him, I immediately transferred him to the back line in [place].

## 2018-06-16 ENCOUNTER — Ambulatory Visit: Payer: Medicare Other | Attending: Radiation Oncology

## 2018-06-16 DIAGNOSIS — M25512 Pain in left shoulder: Secondary | ICD-10-CM | POA: Diagnosis not present

## 2018-06-16 NOTE — Therapy (Signed)
[facility] [address] [place], [place], [address] Phone: [phone]   Fax:  [phone]  Physical Therapy Treatment And discharge note  Patient Details  Name: Holly Rose MRN: [id number] Date of Birth: 03/30/1930 Referring Provider (PT): Glenn, Chrystal   Encounter Date: 06/16/2018  PT End of Session - 06/16/18 1306    Visit Number  9    Number of Visits  18    Date for PT Re-Evaluation  06/28/18    Authorization Type  medicare    Authorization Time Period  8/10    PT Start Time  1306    PT Stop Time  1345    PT Time Calculation (min)  39 min    Activity Tolerance  Patient tolerated treatment well    Behavior During Therapy  WFL for tasks assessed/performed       Past Medical History:  Diagnosis Date  . Allergy   . Anemia   . Arthritis   . Atrial fibrillation (HCC) 2004  . Breast cancer, left (HCC) 10/2017   Lumpectomy and rad tx's.   . CAD (coronary artery disease)   . Complication of anesthesia    hard to wake up with general anesthesia  . Diabetes (HCC)   . Dysrhythmia    A-fib  . Heart attack (HCC) 2008  . Heart murmur   . History of kidney stones   . History of sick sinus syndrome    s/p pacemaker placement  . Hyperlipidemia   . Macular degeneration   . Neuropathy   . Neuropathy   . Pacemaker 06/2010  . Personal history of radiation therapy 11/2017   LEFT lumpectomy   . Squamous cell skin cancer   . TIA (transient ischemic attack)     Past Surgical History:  Procedure Laterality Date  . ABDOMINAL HYSTERECTOMY  1960's  . APPENDECTOMY  1947  . BLADDER SURGERY     1975 and 1995  . BREAST BIOPSY Left 12/17/2016   SINGLE FRAGMENT OF ATYPICAL EPITHELIAL CELLS  . BREAST BIOPSY Left 04/08/2017  . BREAST EXCISIONAL BIOPSY Left 01/18/1986   Benign microcalcifications, [employer].  . BREAST LUMPECTOMY Left 05/26/2017   13 mm,T1c, N0; ER/ PR+, Her 2 neu negative, HIGH RISK by  Mammoprint ;  Surgeon: Byrnett, Jeffrey W, MD;  Location: [facility] ORS;  Service: General;  Laterality: Left;  . BREAST SURGERY  1988  . CARDIAC CATHETERIZATION  1989  . CATARACT EXTRACTION  1997 and 1998  . COLONOSCOPY WITH PROPOFOL N/A 09/12/2015   Procedure: COLONOSCOPY WITH PROPOFOL;  Surgeon: Robert T Elliott, MD;  Location: [facility] ENDOSCOPY;  Service: Endoscopy;  Laterality: N/A;  . KIDNEY STONE SURGERY  2018  . MOHS SURGERY  2019   forehead  . pace maker  2008  . RECTOCELE REPAIR  1996  . TONSILLECTOMY AND ADENOIDECTOMY  1950"s    There were no vitals filed for this visit.  Subjective Assessment - 06/16/18 1309    Subjective  Patient reports that she does have some pain in her L arm in the usual spot. States it hasn't changed too much since start of therapy.     Currently in Pain?  Yes    Pain Score  2    best: 0 worst: 7   Pain Location  Shoulder    Pain Orientation  Left    Pain Descriptors / Indicators  --   "hurts"   Pain Onset  More than a month ago      Pain Relieving Factors  medications, exercises         [facility] PT Assessment - 06/16/18 1314      Assessment   Medical Diagnosis  L shoulder pain    Referring Provider (PT)  Glenn, Chrystal      Precautions   Precautions  None      AROM   Right/Left Shoulder  Right;Left    Right Shoulder Flexion  140 Degrees    Right Shoulder ABduction  120 Degrees    Right Shoulder Internal Rotation  20 Degrees   T10   Right Shoulder External Rotation  40 Degrees   C5 ER   Left Shoulder Flexion  135 Degrees    Left Shoulder ABduction  100 Degrees    Left Shoulder Internal Rotation  20 Degrees   T10   Left Shoulder External Rotation  50 Degrees   C3     Strength   Left Shoulder Flexion  3+/5    Left Shoulder ABduction  3+/5    Left Shoulder Internal Rotation  4/5    Left Shoulder External Rotation  4/5    Right Elbow Flexion  4/5    Right Elbow Extension  4/5    Left Elbow Flexion  4/5    Left Elbow Extension  4/5       Palpation   Palpation comment  --   tender to L AC joint     see objective measures above.   Treatment:  Therapeutic exercise:  PT and patient reviewed HEP, discussed progression, administered.  AROM of L shoulder x10 ea direction with some complaints of pain, demo/visual cues from PT Scapular rows, scapular rows advanced, and b/l shoulder ER x10 with GTB Explanation/demo of doorway stretch.    PT Education - 06/16/18 1306    Education Details  therex, HEP updated    Person(s) Educated  Patient    Methods  Explanation;Demonstration    Comprehension  Verbalized understanding       PT Short Term Goals - 06/16/18 1312      PT SHORT TERM GOAL #1   Title  Patient will report adherence at least 3x a week to home exercise program to improve strength/mobility for better functional independence with ADLs.    Baseline  Patient reports that usually she does do her HEP 3x a week, this last week she reports that she had a lot of visitors which impeded her ability to do her HEP.    Time  3    Period  Weeks    Status  Partially Met    Target Date  06/16/18        PT Long Term Goals - 06/16/18 1313      PT LONG TERM GOAL #1   Title  Patient will improve L shoulder AROM to equal to or 140 degrees of flexion, scaption, and abduction for improved ability to perform overhead activities.    Baseline  ROM 135deg flexion, abduction 110deg    Time  8    Period  Weeks    Status  Partially Met    Target Date  06/16/18      PT LONG TERM GOAL #2   Title  Patient will be able to perform household work/ chores with pain 3/10 or less.    Baseline  Patient reports today her paim was 4/10 to reach into her microwave, other days it has been a 3/10 or less.     Time  8    Period    Weeks    Status  Partially Met      PT LONG TERM GOAL #3   Title   Patient will decrease Quick DASH score by > 8 points demonstrating reduced self-reported upper extremity disability.     Baseline  36 score,  56.8%, 22.7%     Time  8    Period  Weeks    Status  Achieved    Target Date  06/16/18      PT LONG TERM GOAL #4   Title  Patient will improve shoulder external rotation in order to maintain hair care with pain 3/10 or less.    Time  8    Period  Weeks    Status  Achieved    Target Date  06/16/18            Plan - 06/16/18 1421    Clinical Impression Statement  Patient overall reports a decrease in L shoulder pain and improvement in ability to perform functional activities and ADLs. Today patient does report more pain but says that is unusual compared to her last month. Patient demonstrates improved L shoulder ROM and strength as well as postural awareness. At this time though patient has increased pain today, patient feels able to self manage condition at home, and uses exercises to address symptoms. Patient and PT discussed HEP/updated HEP and patient educated about returning to therapy as needed.     PT Treatment/Interventions  ADLs/Self Care Home Management;Neuromuscular re-education;Dry needling;Energy conservation;Functional mobility training;Moist Heat;Aquatic Therapy;Therapeutic activities;Patient/family education;Cryotherapy;Balance training;Passive range of motion;Scar mobilization;Compression bandaging;Therapeutic exercise    Consulted and Agree with Plan of Care  Patient       Patient will benefit from skilled therapeutic intervention in order to improve the following deficits and impairments:  Pain, Postural dysfunction, Decreased strength, Decreased mobility  Visit Diagnosis: Acute pain of left shoulder     Problem List Patient Active Problem List   Diagnosis Date Noted  . Vitamin D deficiency, unspecified 06/16/2017  . Hypercalcinuria 06/14/2017  . SCC (squamous cell carcinoma), face 03/29/2017  . History of kidney stones 03/12/2017  . Malignant neoplasm of upper-outer quadrant of left breast in female, estrogen receptor positive ([place]) 12/28/2016  . Chronic  cystitis 11/05/2016  . Nephrolithiasis 11/05/2016  . Renal colic 40/98/1191  . Urge incontinence 11/05/2016  . Bilateral hand pain 11/03/2016  . Generalized osteoarthritis of hand 11/03/2016  . Numbness and tingling in both hands 11/03/2016  . Bradycardia 04/15/2016  . History of colonic polyps 09/16/2015  . Abdominal pain 08/18/2015  . Rectal bleeding 08/18/2015  . CAD (coronary artery disease) 02/17/2015  . A-fib ([place]) 02/17/2015  . Diabetes mellitus with neuropathy ([place]) 02/17/2015  . Hypercholesterolemia 02/17/2015  . Iron deficiency anemia 02/17/2015  . Macular degeneration 02/17/2015  . Stress 02/17/2015  . Health care maintenance 02/17/2015  . Diarrhea 02/17/2015  . Hyperlipidemia, unspecified 07/13/2014  . Type II diabetes mellitus ([place]) 07/13/2014  . Hyperlipidemia due to type 2 diabetes mellitus ([place]) 07/13/2014  . Diabetic neuropathy ([place]) 03/31/2013  . Diverticulosis 03/31/2013  . GI bleeding 03/31/2013  . Mitral valve prolapse 03/31/2013  . Diverticulosis of intestine without perforation or abscess without bleeding 03/31/2013   Lieutenant Diego PT, DPT 6:22 PM,06/16/18 [facility] [address], [place], [address] Phone: [phone]   Fax:  [phone]  Name: Holly Rose MRN: [id number] Date of Birth: Aug 23, 1930

## 2018-06-29 DIAGNOSIS — E785 Hyperlipidemia, unspecified: Secondary | ICD-10-CM | POA: Diagnosis not present

## 2018-06-29 DIAGNOSIS — E1169 Type 2 diabetes mellitus with other specified complication: Secondary | ICD-10-CM | POA: Diagnosis not present

## 2018-06-29 DIAGNOSIS — E1142 Type 2 diabetes mellitus with diabetic polyneuropathy: Secondary | ICD-10-CM | POA: Diagnosis not present

## 2018-07-05 DIAGNOSIS — H353211 Exudative age-related macular degeneration, right eye, with active choroidal neovascularization: Secondary | ICD-10-CM | POA: Diagnosis not present

## 2018-07-11 ENCOUNTER — Ambulatory Visit: Payer: BLUE CROSS/BLUE SHIELD | Admitting: Oncology

## 2018-07-11 ENCOUNTER — Ambulatory Visit: Payer: Medicare Other | Admitting: Podiatry

## 2018-07-11 DIAGNOSIS — H353221 Exudative age-related macular degeneration, left eye, with active choroidal neovascularization: Secondary | ICD-10-CM | POA: Diagnosis not present

## 2018-07-13 ENCOUNTER — Ambulatory Visit (INDEPENDENT_AMBULATORY_CARE_PROVIDER_SITE_OTHER): Payer: Medicare Other | Admitting: Podiatry

## 2018-07-13 ENCOUNTER — Encounter: Payer: Self-pay | Admitting: Podiatry

## 2018-07-13 DIAGNOSIS — E1142 Type 2 diabetes mellitus with diabetic polyneuropathy: Secondary | ICD-10-CM

## 2018-07-13 DIAGNOSIS — L97521 Non-pressure chronic ulcer of other part of left foot limited to breakdown of skin: Secondary | ICD-10-CM | POA: Diagnosis not present

## 2018-07-13 NOTE — Progress Notes (Signed)
She presents today for follow-up of ulceration to the second digit of the left foot.  She states that she continues to wear her pad when it was staying on.  She states that the toe is a little more swollen a little more tender than it was previously.  Objective: Vital signs are stable she is alert and oriented x3 hammertoe deformity second left demonstrates distal clavus with underlying ulceration does not probe to bone ulceration measures approximately 3 mm in diameter no purulence no malodor.  Assessment: Chronic ulceration second digit left foot none complicated.  Plan: Discussed etiology pathology conservative versus surgical therapies.  At this point I debrided the reactive hyperkeratotic tissue placed Silvadene cream and dressed a compressive dressing.  She will continue conservative therapies and I will follow-up with her in about 2 weeks to make sure this is doing better.

## 2018-07-15 ENCOUNTER — Ambulatory Visit: Payer: Medicare Other | Admitting: Family Medicine

## 2018-07-15 ENCOUNTER — Encounter: Payer: Self-pay | Admitting: Family Medicine

## 2018-07-15 ENCOUNTER — Ambulatory Visit (INDEPENDENT_AMBULATORY_CARE_PROVIDER_SITE_OTHER): Payer: Medicare Other | Admitting: Family Medicine

## 2018-07-15 VITALS — BP 122/68 | HR 90 | Temp 97.6°F | Ht 62.0 in | Wt 157.2 lb

## 2018-07-15 DIAGNOSIS — R350 Frequency of micturition: Secondary | ICD-10-CM | POA: Diagnosis not present

## 2018-07-15 DIAGNOSIS — R300 Dysuria: Secondary | ICD-10-CM | POA: Diagnosis not present

## 2018-07-15 DIAGNOSIS — N390 Urinary tract infection, site not specified: Secondary | ICD-10-CM | POA: Diagnosis not present

## 2018-07-15 DIAGNOSIS — R809 Proteinuria, unspecified: Secondary | ICD-10-CM

## 2018-07-15 LAB — POCT URINALYSIS DIPSTICK
Bilirubin, UA: NEGATIVE
Blood, UA: NEGATIVE
Glucose, UA: NEGATIVE
Ketones, UA: NEGATIVE
Leukocytes, UA: NEGATIVE
Nitrite, UA: POSITIVE
Protein, UA: POSITIVE — AB
Spec Grav, UA: >=1.03 — AB (ref 1.010–1.025)
Urobilinogen, UA: 0.2 E.U./dL
pH, UA: 5.5 (ref 5.0–8.0)

## 2018-07-15 MED ORDER — CEPHALEXIN 500 MG PO CAPS: 500.0000 mg | ORAL_CAPSULE | Freq: Two times a day (BID) | ORAL | 0 refills | Status: DC

## 2018-07-15 NOTE — Progress Notes (Signed)
Holly Rose,

## 2018-07-15 NOTE — Progress Notes (Signed)
Subjective:    Patient ID: Holly Rose, female    DOB: 1930/01/14, 84 y.o.   MRN: [id number]  HPI   Patient presents to clinic with complaint of burning with urination that began 3 days ago.  Patient states after treatment of last UTI with IM ceftriaxone and course of Cipro, she was feeling well.  Patient also reports pressure in lower abdomen and increased urinary frequency.  Denies nausea, vomiting or diarrhea.  Denies fever or chills.  Patient Active Problem List   Diagnosis Date Noted  . Vitamin D deficiency, unspecified 06/16/2017  . Hypercalcinuria 06/14/2017  . SCC (squamous cell carcinoma), face 03/29/2017  . History of kidney stones 03/12/2017  . Malignant neoplasm of upper-outer quadrant of left breast in female, estrogen receptor positive ([place]) 12/28/2016  . Chronic cystitis 11/05/2016  . Nephrolithiasis 11/05/2016  . Renal colic 09/32/6712  . Urge incontinence 11/05/2016  . Bilateral hand pain 11/03/2016  . Generalized osteoarthritis of hand 11/03/2016  . Numbness and tingling in both hands 11/03/2016  . Bradycardia 04/15/2016  . History of colonic polyps 09/16/2015  . Abdominal pain 08/18/2015  . Rectal bleeding 08/18/2015  . CAD (coronary artery disease) 02/17/2015  . A-fib ([place]) 02/17/2015  . Diabetes mellitus with neuropathy ([place]) 02/17/2015  . Hypercholesterolemia 02/17/2015  . Iron deficiency anemia 02/17/2015  . Macular degeneration 02/17/2015  . Stress 02/17/2015  . Health care maintenance 02/17/2015  . Diarrhea 02/17/2015  . Hyperlipidemia, unspecified 07/13/2014  . Type II diabetes mellitus ([place]) 07/13/2014  . Hyperlipidemia due to type 2 diabetes mellitus ([place]) 07/13/2014  . Diabetic neuropathy ([place]) 03/31/2013  . Diverticulosis 03/31/2013  . GI bleeding 03/31/2013  . Mitral valve prolapse 03/31/2013  . Diverticulosis of intestine without perforation or abscess without bleeding 03/31/2013   Social History   Tobacco Use  . Smoking status:  Former Smoker    Years: 5.00    Last attempt to quit: 09/14/1968    Years since quitting: 49.8  . Smokeless tobacco: Never Used  . Tobacco comment: quit 1970  Substance Use Topics  . Alcohol use: No    Alcohol/week: 0.0 standard drinks   Review of Systems  Constitutional: Negative for chills, fatigue and fever.  HENT: Negative for congestion, ear pain, sinus pain and sore throat.   Eyes: Negative.   Respiratory: Negative for cough, shortness of breath and wheezing.   Cardiovascular: Negative for chest pain, palpitations and leg swelling.  Gastrointestinal: +lower ABD pain. Negative for diarrhea, nausea and vomiting.  Genitourinary: +dysuria, frequency and urgency.  Musculoskeletal: Negative for arthralgias and myalgias.  Skin: Negative for color change, pallor and rash.  Neurological: Negative for syncope, light-headedness and headaches.  Psychiatric/Behavioral: The patient is not nervous/anxious.       Objective:   Physical Exam  Constitutional: She is oriented to person, place, and time. She appears well-nourished. No distress.  HENT:  Head: Normocephalic and atraumatic.  Eyes: Conjunctivae and EOM are normal. No scleral icterus.  Neck: Neck supple. No tracheal deviation present.  Cardiovascular: Normal rate, regular rhythm and normal heart sounds.  Pulmonary/Chest: Effort normal and breath sounds normal. No respiratory distress. She has no wheezes. She has no rales.  Abdominal: Soft. Bowel sounds are normal. There is tenderness.  Suprapubic tenderness  Musculoskeletal: She exhibits no edema.  Neurological: She is alert and oriented to person, place, and time.  Skin: Skin is warm and dry. No pallor.  Psychiatric: She has a normal mood and affect. Her behavior is normal.  Nursing note and vitals reviewed.      Vitals:   07/15/18 1130  BP: 122/68  Pulse: 90  Temp: 97.6 F (36.4 C)  SpO2: 92%   Assessment & Plan:   Urinary tract infection, urinary frequency, dysuria  -urinalysis is positive for nitrites and also protein.  We will treat with Keflex 500 mg twice daily for 10 days.  Patient advised to increase fluid intake, avoid excess sugar caffeine, wear cotton underwear and wipe front to back.   Proteinuria - due to protein presence in urine, we will plan to recheck urinalysis microscopic in approximately 6 weeks.  Most recent kidney functions done in August 2019 are stable. Future microscopic UA order placed.  Discussed with patient and family member that if she continues to have UTI symptoms and UTIs, that likely we would prefer her to urology for further evaluation.  Keep regular follow-up as planned with PCP, return to clinic sooner if any issues arise.

## 2018-07-17 NOTE — Progress Notes (Signed)
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  INTERVAL HISTORY: Patient returns to clinic today for routine six-month evaluation.  She continues to feel well and remains asymptomatic.  She is tolerating tamoxifen well without significant side effects.  She has no neurologic complaints. She denies any recent fevers or illnesses. She has a good appetite and denies weight loss. She has no chest pain or shortness of breath. She denies any nausea, vomiting, constipation, or diarrhea. She has no urinary complaints.  Patient feels at her baseline offers no specific complaints today.  REVIEW OF SYSTEMS:   Review of Systems  Constitutional: Negative.  Negative for fever, malaise/fatigue and weight loss.  Respiratory: Negative.  Negative for cough and shortness of breath.   Cardiovascular: Negative.  Negative for chest pain and leg swelling.  Gastrointestinal: Negative.  Negative for abdominal pain, blood in stool and melena.  Genitourinary: Negative.  Negative for dysuria.  Musculoskeletal: Negative.  Negative for back pain.  Skin: Negative.  Negative for rash.  Neurological: Negative.  Negative for sensory change, focal weakness, weakness and headaches.  Psychiatric/Behavioral: Negative.  The patient is not nervous/anxious.     As per HPI. Otherwise, a complete review of systems is negative.  PAST MEDICAL HISTORY: Past Medical History:  Diagnosis Date  . Allergy   . Anemia   . Arthritis   . Atrial fibrillation ([place]) 2004  . Breast cancer, left ([place]) 10/2017   Lumpectomy and rad tx's.   Marland Kitchen CAD  (coronary artery disease)   . Complication of anesthesia    hard to wake up with general anesthesia  . Diabetes ([place])   . Dysrhythmia    A-fib  . Heart attack ([place]) 2008  . Heart murmur   . History of kidney stones   . History of sick sinus syndrome    s/p pacemaker placement  . Hyperlipidemia   . Macular degeneration   . Neuropathy   . Neuropathy   . Pacemaker 06/2010  . Personal history of radiation therapy 11/2017   LEFT lumpectomy   . Squamous cell skin cancer   . TIA (transient ischemic attack)     PAST SURGICAL HISTORY: Past Surgical History:  Procedure Laterality Date  . ABDOMINAL HYSTERECTOMY  1960's  . APPENDECTOMY  1947  . [place]  . BREAST BIOPSY Left 12/17/2016   SINGLE FRAGMENT OF ATYPICAL EPITHELIAL CELLS  . BREAST BIOPSY Left 04/08/2017  . BREAST EXCISIONAL BIOPSY Left 01/18/1986   Benign microcalcifications, [employer].  Marland Kitchen BREAST LUMPECTOMY Left 05/26/2017   13 mm,T1c, N0; ER/ PR+, Her 2 neu negative, HIGH RISK by Mammoprint ;  Surgeon: [name], MD;  Location: [facility] ORS;  Service: General;  Laterality: Left;  . BREAST SURGERY  1988  . CARDIAC CATHETERIZATION  1989  . CATARACT EXTRACTION  1997 and 1998  . COLONOSCOPY WITH PROPOFOL N/A 09/12/2015   Procedure: COLONOSCOPY WITH PROPOFOL;  Surgeon: [name], MD;  Location: [facility] ENDOSCOPY;  Service: Endoscopy;  Laterality: N/A;  . KIDNEY STONE SURGERY  2018  . MOHS SURGERY  2019   forehead  . pace maker  2008  . [place]  . TONSILLECTOMY AND ADENOIDECTOMY  1950"s    FAMILY HISTORY: Family History  Problem Relation Age of Onset  . Stroke Mother   . Diabetes Mother   . Cancer Maternal Aunt        Breast Cancer  . Breast cancer Maternal Aunt   . Arthritis Maternal Grandmother   . Cancer Maternal Aunt        Lung Cancer  . Diabetes Son   . Cancer Son   . Kidney disease Neg Hx   . Bladder Cancer Neg Hx     ADVANCED DIRECTIVES (Y/N):   N  HEALTH MAINTENANCE: Social History   Tobacco Use  . Smoking status: Former Smoker    Years: 5.00    Last attempt to quit: 09/14/1968    Years since quitting: 49.8  . Smokeless tobacco: Never Used  . Tobacco comment: quit 1970  Substance Use Topics  . Alcohol use: No    Alcohol/week: 0.0 standard drinks  . Drug use: No     Colonoscopy:  PAP:  Bone density:  Lipid panel:  Allergies  Allergen Reactions  . Lyrica [Pregabalin] Swelling and Other (See Comments)    Other reaction(s): SWELLING/EDEMA Sleepy, does not eat  . Neurontin [Gabapentin] Nausea And Vomiting and Other (See Comments)    disoriented  . Bactrim [Sulfamethoxazole-Trimethoprim]     Made her dizziness    Current Outpatient Medications  Medication Sig Dispense Refill  . Aflibercept (EYLEA) 2 MG/0.05ML SOLN by Intravitreal route.    Marland Kitchen aspirin 81 MG tablet Take 81 mg by mouth daily.    . Biotin w/ Vitamins C & E (HAIR/SKIN/NAILS PO) Take 1 tablet by mouth daily.    . blood glucose meter kit and supplies Per patient please dispense One touch Ultra2 meter.Use meter and supplies three times a day to check blood sugar. ICD10: E11.40 1 each 0  . cephALEXin (KEFLEX) 500 MG capsule Take 1 capsule (500 mg total) by mouth 2 (two) times daily. 20 capsule 0  . Cholecalciferol (D3-50 PO) Take 1 tablet by mouth daily.    . furosemide (LASIX) 20 MG tablet Take 20 mg by mouth daily.     Marland Kitchen glucose blood (ONE TOUCH ULTRA TEST) test strip USE 1 STRIP TO CHECK GLUCOSE THREE TIMES DAILY 100 each 6  . Lactobacillus (PROBIOTIC ACIDOPHILUS PO) Take 1 tablet by mouth daily.     Marland Kitchen MAGNESIUM SULFATE PO Take 1 tablet by mouth daily.     . metFORMIN (GLUCOPHAGE-XR) 500 MG 24 hr tablet TAKE 2 TABLETS BY MOUTH TWICE DAILY 360 tablet 0  . metoprolol (LOPRESSOR) 50 MG tablet Take 50 mg by mouth daily.     . Multiple Vitamins-Minerals (PRESERVISION AREDS 2 PO) Take 1 tablet by mouth daily.     . mupirocin ointment (BACTROBAN) 2 % Apply to  wound after soaking BID 30 g 1  . Polyethyl Glycol-Propyl Glycol (SYSTANE OP) Apply 1 drop to eye 2 (two) times daily.    . tamoxifen (NOLVADEX) 20 MG tablet Take 1 tablet (20 mg total) by mouth daily. 30 tablet 11  . vitamin B-12 (CYANOCOBALAMIN) 1000 MCG tablet Take 1,000 mcg by mouth daily.    Marland Kitchen alendronate (FOSAMAX) 70 MG tablet Take 1 tablet (70 mg total) by mouth once a week. Take with a full glass of water on an empty stomach. 12 tablet 3   No current facility-administered medications for this visit.     OBJECTIVE: Vitals:  07/21/18 1105  BP: (!) 159/82  Pulse: 70  Resp: 18  Temp: 97.7 F (36.5 C)     Body mass index is 28.66 kg/m.    ECOG FS:0 - Asymptomatic  General: Well-developed, well-nourished, no acute distress. Eyes: Pink conjunctiva, anicteric sclera. HEENT: Normocephalic, moist mucous membranes. Breast: Patient declined exam today. Lungs: Clear to auscultation bilaterally. Heart: Regular rate and rhythm. No rubs, murmurs, or gallops. Abdomen: Soft, nontender, nondistended. No organomegaly noted, normoactive bowel sounds. Musculoskeletal: No edema, cyanosis, or clubbing. Neuro: Alert, answering all questions appropriately. Cranial nerves grossly intact. Skin: No rashes or petechiae noted. Psych: Normal affect.  LAB RESULTS:  Lab Results  Component Value Date   NA 139 04/18/2018   K 4.3 04/18/2018   CL 103 04/18/2018   CO2 22 04/18/2018   GLUCOSE 128 (H) 04/18/2018   BUN 9 04/18/2018   CREATININE 0.63 04/18/2018   CALCIUM 10.3 04/18/2018   PROT 7.3 04/18/2018   ALBUMIN 4.0 04/18/2018   AST 24 04/18/2018   ALT 18 04/18/2018   ALKPHOS 42 04/18/2018   BILITOT 0.3 04/18/2018   GFRNONAA 81 04/18/2018   GFRAA 94 04/18/2018    Lab Results  Component Value Date   WBC 4.7 04/18/2018   NEUTROABS 2.6 04/18/2018   HGB 13.1 04/18/2018   HCT 39.3 04/18/2018   MCV 91 04/18/2018   PLT 158 04/18/2018     STUDIES: Dg Bone Density  Result Date:  07/18/2018 EXAM: DUAL X-RAY ABSORPTIOMETRY (DXA) FOR BONE MINERAL DENSITY IMPRESSION: Technologist: KBD PATIENT BIOGRAPHICAL: Name: Orva, Riles Patient ID: [id number] Birth Date: 11/03/1929 Height: 62.0 in. Gender: Female Exam Date: 07/18/2018 Weight: 157.2 lbs. Indications: Postmenopausal, Hysterectomy, Advanced Age, Diabetic, Caucasian Fractures: Treatments: Estrace, Multi-Vitamin with calcium ASSESSMENT: The BMD measured at Femur Neck Left is 0.684 g/cm2 with a T-score of -2.5. This patient is considered osteoporotic according to [facility]) criteria. The quality of this scan is good. Site Region Measured Measured WHO Young Adult BMD Date       Age      Classification T-score AP Spine L1-L4 07/18/2018 86.6 Osteopenia -1.2 1.044 g/cm2 AP Spine L1-L4 06/30/2017 85.6 Osteopenia -1.1 1.054 g/cm2 AP Spine L1-L4 07/14/2016 84.6 Osteopenia -1.1 1.054 g/cm2 DualFemur Neck Left 07/18/2018 86.6 Osteoporosis -2.5 0.684 g/cm2 DualFemur Neck Left 06/30/2017 85.6 Osteoporosis -2.5 0.689 g/cm2 DualFemur Neck Left 07/14/2016 84.6 Osteopenia -2.2 0.726 g/cm2 DualFemur Total Mean 07/18/2018 86.6 Osteopenia -1.9 0.766 g/cm2 DualFemur Total Mean 06/30/2017 85.6 Osteopenia -2.0 0.761 g/cm2 DualFemur Total Mean 07/14/2016 84.6 Osteopenia -1.7 0.792 g/cm2 World Health Organization [facility]) criteria for post-menopausal, Caucasian Women: Normal:       T-score at or above -1 SD Osteopenia:   T-score between -1 and -2.5 SD Osteoporosis: T-score at or below -2.5 SD RECOMMENDATIONS: 1. All patients should optimize calcium and vitamin D intake. 2. Consider FDA-approved medical therapies in postmenopausal women and men aged 63 years and older, based on the following: a. A hip or vertebral(clinical or morphometric) fracture b. T-score < -2.5 at the femoral neck or spine after appropriate evaluation to exclude secondary causes c. Low bone mass (T-score between -1.0 and -2.5 at the femoral neck or spine) and a 10-year  probability of a hip fracture > 3% or a 10-year probability of a major osteoporosis-related fracture > 20% based on the [place]-adapted WHO algorithm d. Clinician judgment and/or patient preferences may indicate treatment for people with 10-year fracture probabilities above or below these levels FOLLOW-UP: People with diagnosed cases of osteoporosis  or at high risk for fracture should have regular bone mineral density tests. For patients eligible for Medicare, routine testing is allowed once every 2 years. The testing frequency can be increased to one year for patients who have rapidly progressing disease, those who are receiving or discontinuing medical therapy to restore bone mass, or have additional risk factors. I have reviewed this report, and agree with the above findings. [facility] Electronically Signed   By: Earle Gell M.D.   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  PLAN:    1.  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  She has now completed XRT.  Her most recent mammogram on Feb 10, 2018 was reported as BI-RADS 2.  Repeat in May 2020.  Continue tamoxifen for a total of 5 years completing treatment in January 2024.  Return to clinic in 6 months for routine evaluation.   2.  Osteoporosis: Bone mineral density from July 18, 2018 reported T score of -2.5.  This is unchanged from one year prior.  Continue tamoxifen as above.  Patient was also given a prescription for Fosamax and instructed to continue calcium and vitamin D supplementation.  Repeat bone mineral density in November 2020.     Patient expressed understanding and was  in agreement with this plan. She also understands that She can call clinic at any time with any questions, concerns, or complaints.   Cancer Staging Malignant neoplasm of upper-outer quadrant of left breast in female, estrogen receptor positive ([place]) Staging form: Breast, AJCC 8th Edition - Pathologic stage from 06/27/2017: Stage IA (pT1c, pN0, cM0, G1, ER: Positive, PR: Positive, HER2: Negative) - Signed by [name], MD on 06/27/2017   [name], MD   07/22/2018 11:15 AM

## 2018-07-18 ENCOUNTER — Ambulatory Visit
Admission: RE | Admit: 2018-07-18 | Discharge: 2018-07-18 | Disposition: A | Discharge status: Home / Self Care | Payer: Medicare Other | Source: Ambulatory Visit | Attending: Oncology | Admitting: Oncology

## 2018-07-18 DIAGNOSIS — Z13820 Encounter for screening for osteoporosis: Secondary | ICD-10-CM | POA: Insufficient documentation

## 2018-07-18 DIAGNOSIS — Z170 Estrogen receptor positive status [ER+]: Secondary | ICD-10-CM

## 2018-07-18 DIAGNOSIS — M810 Age-related osteoporosis without current pathological fracture: Secondary | ICD-10-CM | POA: Diagnosis not present

## 2018-07-18 DIAGNOSIS — M8588 Other specified disorders of bone density and structure, other site: Secondary | ICD-10-CM | POA: Insufficient documentation

## 2018-07-18 DIAGNOSIS — Z780 Asymptomatic menopausal state: Secondary | ICD-10-CM | POA: Diagnosis not present

## 2018-07-18 DIAGNOSIS — M8589 Other specified disorders of bone density and structure, multiple sites: Secondary | ICD-10-CM | POA: Diagnosis not present

## 2018-07-18 DIAGNOSIS — C50412 Malignant neoplasm of upper-outer quadrant of left female breast: Secondary | ICD-10-CM

## 2018-07-18 LAB — URINE CULTURE
MICRO NUMBER:: 91317361
SPECIMEN QUALITY:: ADEQUATE

## 2018-07-21 ENCOUNTER — Inpatient Hospital Stay: Payer: Medicare Other | Attending: Oncology | Admitting: Oncology

## 2018-07-21 ENCOUNTER — Encounter: Payer: Self-pay | Admitting: Oncology

## 2018-07-21 ENCOUNTER — Other Ambulatory Visit: Payer: Self-pay

## 2018-07-21 VITALS — BP 159/82 | HR 70 | Temp 97.7°F | Resp 18 | Wt 156.7 lb

## 2018-07-21 DIAGNOSIS — R011 Cardiac murmur, unspecified: Secondary | ICD-10-CM | POA: Diagnosis not present

## 2018-07-21 DIAGNOSIS — Z801 Family history of malignant neoplasm of trachea, bronchus and lung: Secondary | ICD-10-CM | POA: Diagnosis not present

## 2018-07-21 DIAGNOSIS — Z87891 Personal history of nicotine dependence: Secondary | ICD-10-CM | POA: Diagnosis not present

## 2018-07-21 DIAGNOSIS — G629 Polyneuropathy, unspecified: Secondary | ICD-10-CM

## 2018-07-21 DIAGNOSIS — Z7982 Long term (current) use of aspirin: Secondary | ICD-10-CM | POA: Diagnosis not present

## 2018-07-21 DIAGNOSIS — C50412 Malignant neoplasm of upper-outer quadrant of left female breast: Secondary | ICD-10-CM

## 2018-07-21 DIAGNOSIS — Z79810 Long term (current) use of selective estrogen receptor modulators (SERMs): Secondary | ICD-10-CM

## 2018-07-21 DIAGNOSIS — I2510 Atherosclerotic heart disease of native coronary artery without angina pectoris: Secondary | ICD-10-CM

## 2018-07-21 DIAGNOSIS — Z923 Personal history of irradiation: Secondary | ICD-10-CM

## 2018-07-21 DIAGNOSIS — E119 Type 2 diabetes mellitus without complications: Secondary | ICD-10-CM

## 2018-07-21 DIAGNOSIS — I4891 Unspecified atrial fibrillation: Secondary | ICD-10-CM | POA: Diagnosis not present

## 2018-07-21 DIAGNOSIS — Z170 Estrogen receptor positive status [ER+]: Secondary | ICD-10-CM | POA: Diagnosis not present

## 2018-07-21 DIAGNOSIS — Z803 Family history of malignant neoplasm of breast: Secondary | ICD-10-CM | POA: Diagnosis not present

## 2018-07-21 DIAGNOSIS — Z8673 Personal history of transient ischemic attack (TIA), and cerebral infarction without residual deficits: Secondary | ICD-10-CM | POA: Diagnosis not present

## 2018-07-21 DIAGNOSIS — E785 Hyperlipidemia, unspecified: Secondary | ICD-10-CM

## 2018-07-21 DIAGNOSIS — Z85828 Personal history of other malignant neoplasm of skin: Secondary | ICD-10-CM | POA: Diagnosis not present

## 2018-07-21 DIAGNOSIS — Z79899 Other long term (current) drug therapy: Secondary | ICD-10-CM | POA: Diagnosis not present

## 2018-07-21 DIAGNOSIS — M810 Age-related osteoporosis without current pathological fracture: Secondary | ICD-10-CM

## 2018-07-21 DIAGNOSIS — I252 Old myocardial infarction: Secondary | ICD-10-CM

## 2018-07-21 MED ORDER — ALENDRONATE SODIUM 70 MG PO TABS: 70.0000 mg | ORAL_TABLET | ORAL | 3 refills | Status: DC

## 2018-07-21 NOTE — Progress Notes (Signed)
Patient here for follow up

## 2018-07-27 DIAGNOSIS — Z85828 Personal history of other malignant neoplasm of skin: Secondary | ICD-10-CM | POA: Diagnosis not present

## 2018-07-27 DIAGNOSIS — Z08 Encounter for follow-up examination after completed treatment for malignant neoplasm: Secondary | ICD-10-CM | POA: Diagnosis not present

## 2018-07-27 DIAGNOSIS — L565 Disseminated superficial actinic porokeratosis (DSAP): Secondary | ICD-10-CM | POA: Diagnosis not present

## 2018-07-29 DIAGNOSIS — I4811 Longstanding persistent atrial fibrillation: Secondary | ICD-10-CM | POA: Diagnosis not present

## 2018-08-03 ENCOUNTER — Encounter: Payer: Self-pay | Admitting: Podiatry

## 2018-08-03 ENCOUNTER — Ambulatory Visit (INDEPENDENT_AMBULATORY_CARE_PROVIDER_SITE_OTHER): Payer: Medicare Other | Admitting: Podiatry

## 2018-08-03 DIAGNOSIS — L97521 Non-pressure chronic ulcer of other part of left foot limited to breakdown of skin: Secondary | ICD-10-CM | POA: Diagnosis not present

## 2018-08-03 DIAGNOSIS — E1142 Type 2 diabetes mellitus with diabetic polyneuropathy: Secondary | ICD-10-CM | POA: Diagnosis not present

## 2018-08-03 NOTE — Progress Notes (Signed)
She presents today for follow-up of ulceration to the tip of her second toe of her left foot.  Denies fever chills nausea vomiting muscle aches and pains states that it seems to be getting better.  Objective: Vital signs are stable alert oriented x3 once debrided reactive hyperkeratotic tissue resulted in a superficial wound measuring no more than a millimeter in diameter does not seem to probe to bone.  Assessment: Distal clavus hammertoe deformity second left.  It superficial ulceration.  Plan: Debrided ulceration second digit left foot.  Placed padding and she will continue to do so on a regular basis.

## 2018-08-15 ENCOUNTER — Emergency Department: Payer: Medicare Other

## 2018-08-15 ENCOUNTER — Other Ambulatory Visit: Payer: Self-pay

## 2018-08-15 ENCOUNTER — Inpatient Hospital Stay
Admission: EM | Admit: 2018-08-15 | Discharge: 2018-08-17 | DRG: 690 | Disposition: A | Discharge status: Home / Self Care | Payer: Medicare Other | Attending: Internal Medicine | Admitting: Internal Medicine

## 2018-08-15 ENCOUNTER — Encounter: Payer: Self-pay | Admitting: Emergency Medicine

## 2018-08-15 DIAGNOSIS — Z170 Estrogen receptor positive status [ER+]: Secondary | ICD-10-CM

## 2018-08-15 DIAGNOSIS — Z8673 Personal history of transient ischemic attack (TIA), and cerebral infarction without residual deficits: Secondary | ICD-10-CM

## 2018-08-15 DIAGNOSIS — E785 Hyperlipidemia, unspecified: Secondary | ICD-10-CM | POA: Diagnosis present

## 2018-08-15 DIAGNOSIS — R1111 Vomiting without nausea: Secondary | ICD-10-CM | POA: Diagnosis not present

## 2018-08-15 DIAGNOSIS — N3001 Acute cystitis with hematuria: Secondary | ICD-10-CM

## 2018-08-15 DIAGNOSIS — Z833 Family history of diabetes mellitus: Secondary | ICD-10-CM | POA: Diagnosis not present

## 2018-08-15 DIAGNOSIS — Z87442 Personal history of urinary calculi: Secondary | ICD-10-CM

## 2018-08-15 DIAGNOSIS — R42 Dizziness and giddiness: Secondary | ICD-10-CM | POA: Diagnosis not present

## 2018-08-15 DIAGNOSIS — I252 Old myocardial infarction: Secondary | ICD-10-CM

## 2018-08-15 DIAGNOSIS — Z85828 Personal history of other malignant neoplasm of skin: Secondary | ICD-10-CM | POA: Diagnosis not present

## 2018-08-15 DIAGNOSIS — Z801 Family history of malignant neoplasm of trachea, bronchus and lung: Secondary | ICD-10-CM | POA: Diagnosis not present

## 2018-08-15 DIAGNOSIS — I2510 Atherosclerotic heart disease of native coronary artery without angina pectoris: Secondary | ICD-10-CM | POA: Diagnosis not present

## 2018-08-15 DIAGNOSIS — I482 Chronic atrial fibrillation, unspecified: Secondary | ICD-10-CM | POA: Diagnosis not present

## 2018-08-15 DIAGNOSIS — Z803 Family history of malignant neoplasm of breast: Secondary | ICD-10-CM | POA: Diagnosis not present

## 2018-08-15 DIAGNOSIS — R110 Nausea: Secondary | ICD-10-CM | POA: Diagnosis not present

## 2018-08-15 DIAGNOSIS — Z823 Family history of stroke: Secondary | ICD-10-CM | POA: Diagnosis not present

## 2018-08-15 DIAGNOSIS — E1140 Type 2 diabetes mellitus with diabetic neuropathy, unspecified: Secondary | ICD-10-CM | POA: Diagnosis present

## 2018-08-15 DIAGNOSIS — H3530 Unspecified macular degeneration: Secondary | ICD-10-CM | POA: Diagnosis present

## 2018-08-15 DIAGNOSIS — Z8261 Family history of arthritis: Secondary | ICD-10-CM

## 2018-08-15 DIAGNOSIS — C50412 Malignant neoplasm of upper-outer quadrant of left female breast: Secondary | ICD-10-CM | POA: Diagnosis present

## 2018-08-15 DIAGNOSIS — I4891 Unspecified atrial fibrillation: Secondary | ICD-10-CM | POA: Diagnosis not present

## 2018-08-15 DIAGNOSIS — Z7984 Long term (current) use of oral hypoglycemic drugs: Secondary | ICD-10-CM | POA: Diagnosis not present

## 2018-08-15 DIAGNOSIS — Z87891 Personal history of nicotine dependence: Secondary | ICD-10-CM

## 2018-08-15 DIAGNOSIS — Z794 Long term (current) use of insulin: Secondary | ICD-10-CM

## 2018-08-15 DIAGNOSIS — Z950 Presence of cardiac pacemaker: Secondary | ICD-10-CM | POA: Diagnosis not present

## 2018-08-15 DIAGNOSIS — I25119 Atherosclerotic heart disease of native coronary artery with unspecified angina pectoris: Secondary | ICD-10-CM | POA: Diagnosis present

## 2018-08-15 DIAGNOSIS — Z79818 Long term (current) use of other agents affecting estrogen receptors and estrogen levels: Secondary | ICD-10-CM

## 2018-08-15 DIAGNOSIS — N390 Urinary tract infection, site not specified: Secondary | ICD-10-CM | POA: Diagnosis not present

## 2018-08-15 DIAGNOSIS — Z7982 Long term (current) use of aspirin: Secondary | ICD-10-CM

## 2018-08-15 DIAGNOSIS — R531 Weakness: Secondary | ICD-10-CM | POA: Diagnosis not present

## 2018-08-15 DIAGNOSIS — Z90710 Acquired absence of both cervix and uterus: Secondary | ICD-10-CM

## 2018-08-15 DIAGNOSIS — S0990XA Unspecified injury of head, initial encounter: Secondary | ICD-10-CM | POA: Diagnosis not present

## 2018-08-15 DIAGNOSIS — Z923 Personal history of irradiation: Secondary | ICD-10-CM | POA: Diagnosis not present

## 2018-08-15 DIAGNOSIS — W19XXXA Unspecified fall, initial encounter: Secondary | ICD-10-CM | POA: Diagnosis present

## 2018-08-15 DIAGNOSIS — Z7983 Long term (current) use of bisphosphonates: Secondary | ICD-10-CM

## 2018-08-15 DIAGNOSIS — Z79899 Other long term (current) drug therapy: Secondary | ICD-10-CM

## 2018-08-15 DIAGNOSIS — C50912 Malignant neoplasm of unspecified site of left female breast: Secondary | ICD-10-CM

## 2018-08-15 DIAGNOSIS — I10 Essential (primary) hypertension: Secondary | ICD-10-CM | POA: Diagnosis not present

## 2018-08-15 DIAGNOSIS — Z881 Allergy status to other antibiotic agents status: Secondary | ICD-10-CM | POA: Diagnosis not present

## 2018-08-15 DIAGNOSIS — Z888 Allergy status to other drugs, medicaments and biological substances status: Secondary | ICD-10-CM | POA: Diagnosis not present

## 2018-08-15 DIAGNOSIS — I6623 Occlusion and stenosis of bilateral posterior cerebral arteries: Secondary | ICD-10-CM | POA: Diagnosis not present

## 2018-08-15 DIAGNOSIS — I495 Sick sinus syndrome: Secondary | ICD-10-CM | POA: Diagnosis present

## 2018-08-15 DIAGNOSIS — E119 Type 2 diabetes mellitus without complications: Secondary | ICD-10-CM | POA: Diagnosis not present

## 2018-08-15 LAB — URINALYSIS, COMPLETE (UACMP) WITH MICROSCOPIC
Bilirubin Urine: NEGATIVE
Glucose, UA: NEGATIVE mg/dL
Hgb urine dipstick: NEGATIVE
Ketones, ur: NEGATIVE mg/dL
Nitrite: POSITIVE — AB
Protein, ur: NEGATIVE mg/dL
Specific Gravity, Urine: 1.009 (ref 1.005–1.030)
pH: 7 (ref 5.0–8.0)

## 2018-08-15 LAB — BASIC METABOLIC PANEL
Anion gap: 7 (ref 5–15)
BUN: 10 mg/dL (ref 8–23)
CO2: 23 mmol/L (ref 22–32)
Calcium: 9.5 mg/dL (ref 8.9–10.3)
Chloride: 107 mmol/L (ref 98–111)
Creatinine, Ser: 0.37 mg/dL — ABNORMAL LOW (ref 0.44–1.00)
GFR calc Af Amer: >60 mL/min (ref >60)
GFR calc non Af Amer: >60 mL/min (ref >60)
Glucose, Bld: 162 mg/dL — ABNORMAL HIGH (ref 70–99)
Potassium: 4.1 mmol/L (ref 3.5–5.1)
Sodium: 137 mmol/L (ref 135–145)

## 2018-08-15 LAB — TROPONIN I: Troponin I: <0.03 ng/mL (ref <0.03)

## 2018-08-15 LAB — CBC
HCT: 38.2 % (ref 36.0–46.0)
Hemoglobin: 12.1 g/dL (ref 12.0–15.0)
MCH: 29.1 pg (ref 26.0–34.0)
MCHC: 31.7 g/dL (ref 30.0–36.0)
MCV: 91.8 fL (ref 80.0–100.0)
Platelets: 149 10*3/uL — ABNORMAL LOW (ref 150–400)
RBC: 4.16 MIL/uL (ref 3.87–5.11)
RDW: 14.6 % (ref 11.5–15.5)
WBC: 4.8 10*3/uL (ref 4.0–10.5)
nRBC: 0 % (ref 0.0–0.2)

## 2018-08-15 MED ORDER — ONDANSETRON HCL 4 MG PO TABS: 4.0000 mg | ORAL_TABLET | Freq: Four times a day (QID) | ORAL | Status: DC | PRN

## 2018-08-15 MED ORDER — MECLIZINE HCL 25 MG PO TABS
25.0000 mg | ORAL_TABLET | Freq: Once | ORAL | Status: AC
  Administered 2018-08-15: 25 mg via ORAL
  Filled 2018-08-15: qty 1

## 2018-08-15 MED ORDER — ONDANSETRON HCL 4 MG/2ML IJ SOLN
4.0000 mg | Freq: Once | INTRAMUSCULAR | Status: AC
  Administered 2018-08-15: 4 mg via INTRAVENOUS
  Filled 2018-08-15: qty 2

## 2018-08-15 MED ORDER — INSULIN ASPART 100 UNIT/ML ~~LOC~~ SOLN
0.0000 [IU] | Freq: Three times a day (TID) | SUBCUTANEOUS | Status: DC
  Administered 2018-08-16: 18:00:00 2 [IU] via SUBCUTANEOUS
  Administered 2018-08-16: 13:00:00 1 [IU] via SUBCUTANEOUS
  Filled 2018-08-15 (×2): qty 1

## 2018-08-15 MED ORDER — ACETAMINOPHEN 325 MG PO TABS: 650.0000 mg | ORAL_TABLET | Freq: Four times a day (QID) | ORAL | Status: DC | PRN

## 2018-08-15 MED ORDER — ONDANSETRON HCL 4 MG/2ML IJ SOLN: 4.0000 mg | Freq: Four times a day (QID) | INTRAMUSCULAR | Status: DC | PRN

## 2018-08-15 MED ORDER — METOPROLOL TARTRATE 50 MG PO TABS
50.0000 mg | ORAL_TABLET | Freq: Every day | ORAL | Status: DC
  Administered 2018-08-16: 50 mg via ORAL
  Filled 2018-08-15: qty 1

## 2018-08-15 MED ORDER — FUROSEMIDE 20 MG PO TABS
20.0000 mg | ORAL_TABLET | Freq: Every day | ORAL | Status: DC
  Administered 2018-08-16 – 2018-08-17 (×2): 20 mg via ORAL
  Filled 2018-08-15 (×2): qty 1

## 2018-08-15 MED ORDER — SODIUM CHLORIDE 0.9 % IV BOLUS
500.0000 mL | Freq: Once | INTRAVENOUS | Status: AC
  Administered 2018-08-15: 500 mL via INTRAVENOUS

## 2018-08-15 MED ORDER — SODIUM CHLORIDE 0.9 % IV SOLN
1.0000 g | Freq: Once | INTRAVENOUS | Status: AC
  Administered 2018-08-15: 1 g via INTRAVENOUS
  Filled 2018-08-15: qty 10

## 2018-08-15 MED ORDER — ACETAMINOPHEN 650 MG RE SUPP: 650.0000 mg | Freq: Four times a day (QID) | RECTAL | Status: DC | PRN

## 2018-08-15 MED ORDER — TAMOXIFEN CITRATE 10 MG PO TABS
20.0000 mg | ORAL_TABLET | Freq: Every day | ORAL | Status: DC
  Administered 2018-08-16 – 2018-08-17 (×2): 20 mg via ORAL
  Filled 2018-08-15 (×2): qty 2

## 2018-08-15 MED ORDER — ASPIRIN EC 81 MG PO TBEC
81.0000 mg | DELAYED_RELEASE_TABLET | Freq: Every day | ORAL | Status: DC
  Administered 2018-08-16 – 2018-08-17 (×2): 81 mg via ORAL
  Filled 2018-08-15 (×2): qty 1

## 2018-08-15 MED ORDER — IOHEXOL 350 MG/ML SOLN
75.0000 mL | Freq: Once | INTRAVENOUS | Status: AC | PRN
  Administered 2018-08-15: 75 mL via INTRAVENOUS

## 2018-08-15 MED ORDER — ENOXAPARIN SODIUM 40 MG/0.4ML ~~LOC~~ SOLN
40.0000 mg | SUBCUTANEOUS | Status: DC
  Administered 2018-08-15 – 2018-08-16 (×2): 40 mg via SUBCUTANEOUS
  Filled 2018-08-15 (×2): qty 0.4

## 2018-08-15 NOTE — ED Notes (Signed)
Patient transported to CT 

## 2018-08-15 NOTE — ED Provider Notes (Signed)
[facility] Emergency Department Provider Note  ____________________________________________  Time seen: Approximately 4:37 PM  I have reviewed the triage vital signs and the nursing notes.   HISTORY  Chief Complaint Dizziness   HPI Holly Rose is a 84 y.o. female the history of atrial fibrillation not on anticoagulation, sick sinus syndrome status post pacemaker, CAD, anemia, hyperlipidemia, macular degeneration, TIA who presents for evaluation of dizziness.  Patient reports dizziness that started yesterday.  She reports that the dizziness was initially intermittent however today has been persistent throughout most of the day.  She reports that she feels off balance and not lightheaded.  Laying down makes her symptoms better but not fully resolved.  Moving around makes them worse.  She had a fall at the bathroom due to severe dizziness.  She denies head trauma or LOC.  She denies injuring herself from the fall.  She was assisted by her family.  She denies ever having similar symptoms.  She reports for the last month that she has had bilateral neck pain that she describes as a sharp pain that is present only when she turns her head on the contralateral side.  She denies diplopia, dysarthria, dysphasia, facial droop, slurred speech, unilateral weakness or numbness, headache, chest pain.  No tinnitus or changes in hearing.   Past Medical History:  Diagnosis Date  . Allergy   . Anemia   . Arthritis   . Atrial fibrillation ([place]) 2004  . Breast cancer, left ([place]) 10/2017   Lumpectomy and rad tx's.   Marland Kitchen CAD (coronary artery disease)   . Complication of anesthesia    hard to wake up with general anesthesia  . Diabetes ([place])   . Dysrhythmia    A-fib  . Heart attack ([place]) 2008  . Heart murmur   . History of kidney stones   . History of sick sinus syndrome    s/p pacemaker placement  . Hyperlipidemia   . Macular degeneration   . Neuropathy   . Neuropathy    . Pacemaker 06/2010  . Personal history of radiation therapy 11/2017   LEFT lumpectomy   . Squamous cell skin cancer   . TIA (transient ischemic attack)     Patient Active Problem List   Diagnosis Date Noted  . UTI (urinary tract infection) 08/15/2018  . Vitamin D deficiency, unspecified 06/16/2017  . Hypercalcinuria 06/14/2017  . SCC (squamous cell carcinoma), face 03/29/2017  . History of kidney stones 03/12/2017  . Malignant neoplasm of upper-outer quadrant of left breast in female, estrogen receptor positive ([place]) 12/28/2016  . Chronic cystitis 11/05/2016  . Nephrolithiasis 11/05/2016  . Renal colic 97/41/6384  . Urge incontinence 11/05/2016  . Bilateral hand pain 11/03/2016  . Generalized osteoarthritis of hand 11/03/2016  . Numbness and tingling in both hands 11/03/2016  . Bradycardia 04/15/2016  . History of colonic polyps 09/16/2015  . Abdominal pain 08/18/2015  . Rectal bleeding 08/18/2015  . CAD (coronary artery disease) 02/17/2015  . Atrial fibrillation, chronic 02/17/2015  . Diabetes mellitus with neuropathy ([place]) 02/17/2015  . Hypercholesterolemia 02/17/2015  . Iron deficiency anemia 02/17/2015  . Macular degeneration 02/17/2015  . Stress 02/17/2015  . Health care maintenance 02/17/2015  . Diarrhea 02/17/2015  . Type II diabetes mellitus ([place]) 07/13/2014  . Diverticulosis 03/31/2013  . GI bleeding 03/31/2013  . Mitral valve prolapse 03/31/2013  . Diverticulosis of intestine without perforation or abscess without bleeding 03/31/2013    Past Surgical History:  Procedure Laterality Date  .  ABDOMINAL HYSTERECTOMY  1960's  . APPENDECTOMY  1947  . [place]  . BREAST BIOPSY Left 12/17/2016   SINGLE FRAGMENT OF ATYPICAL EPITHELIAL CELLS  . BREAST BIOPSY Left 04/08/2017  . BREAST EXCISIONAL BIOPSY Left 01/18/1986   Benign microcalcifications, [employer].  Marland Kitchen BREAST LUMPECTOMY Left 05/26/2017   13 mm,T1c, N0; ER/ PR+, Her 2 neu  negative, HIGH RISK by [place] ;  Surgeon: [name], MD;  Location: [facility] ORS;  Service: General;  Laterality: Left;  . BREAST SURGERY  1988  . CARDIAC CATHETERIZATION  1989  . CATARACT EXTRACTION  1997 and 1998  . COLONOSCOPY WITH PROPOFOL N/A 09/12/2015   Procedure: COLONOSCOPY WITH PROPOFOL;  Surgeon: [name], MD;  Location: [facility] ENDOSCOPY;  Service: Endoscopy;  Laterality: N/A;  . KIDNEY STONE SURGERY  2018  . MOHS SURGERY  2019   forehead  . pace maker  2008  . [place]  . TONSILLECTOMY AND ADENOIDECTOMY  1950"s    Prior to Admission medications   Medication Sig Start Date End Date Taking? Authorizing Provider  Aflibercept (EYLEA) 2 MG/0.05ML SOLN 1 each by Intravitreal route as directed.    Yes [provider]  aspirin 81 MG tablet Take 81 mg by mouth daily.   Yes [provider]  Biotin w/ Vitamins C & E (HAIR/SKIN/NAILS PO) Take 1 tablet by mouth daily.   Yes [provider]  Cholecalciferol (D3-50 PO) Take 3 tablets by mouth daily.    Yes [provider]  furosemide (LASIX) 20 MG tablet Take 20 mg by mouth daily.    Yes [provider]  metFORMIN (GLUCOPHAGE-XR) 500 MG 24 hr tablet TAKE 2 TABLETS BY MOUTH TWICE DAILY 05/25/18  Yes [name], MD  metoprolol (LOPRESSOR) 50 MG tablet Take 25 mg by mouth 2 (two) times daily.  02/03/16  Yes [provider]  Multiple Vitamins-Minerals (PRESERVISION AREDS 2 PO) Take 2 tablets by mouth daily.    Yes [provider]  Polyethyl Glycol-Propyl Glycol (SYSTANE OP) Apply 1 drop to eye 2 (two) times daily.   Yes [provider]  tamoxifen (NOLVADEX) 20 MG tablet Take 1 tablet (20 mg total) by mouth daily. 08/24/17  Yes [name], MD  blood glucose meter kit and supplies Per patient please dispense One touch Ultra2 meter.Use meter and supplies three times a day to check blood sugar. ICD10: E11.40 07/25/15   [name], MD    glucose blood (ONE TOUCH ULTRA TEST) test strip USE 1 STRIP TO CHECK GLUCOSE THREE TIMES DAILY 05/25/18   [name], MD    Allergies Lyrica [pregabalin]; Neurontin [gabapentin]; and Bactrim [sulfamethoxazole-trimethoprim]  Family History  Problem Relation Age of Onset  . Stroke Mother   . Diabetes Mother   . Cancer Maternal Aunt        Breast Cancer  . Breast cancer Maternal Aunt   . Arthritis Maternal Grandmother   . Cancer Maternal Aunt        Lung Cancer  . Diabetes Son   . Cancer Son   . Kidney disease Neg Hx   . Bladder Cancer Neg Hx     Social History Social History   Tobacco Use  . Smoking status: Former Smoker    Years: 5.00    Last attempt to quit: 09/14/1968    Years since quitting: 49.9  . Smokeless tobacco: Never Used  . Tobacco comment: quit 1970  Substance  Use Topics  . Alcohol use: No    Alcohol/week: 0.0 standard drinks  . Drug use: No    Review of Systems  Constitutional: Negative for fever. Eyes: Negative for visual changes. ENT: Negative for sore throat. Neck: No neck pain  Cardiovascular: Negative for chest pain. Respiratory: Negative for shortness of breath. Gastrointestinal: Negative for abdominal pain, vomiting or diarrhea. Genitourinary: Negative for dysuria. Musculoskeletal: Negative for back pain. Skin: Negative for rash. Neurological: Negative for headaches, weakness or numbness. + dizziness Psych: No SI or HI  ____________________________________________   PHYSICAL EXAM:  VITAL SIGNS: ED Triage Vitals  Enc Vitals Group     BP 08/15/18 1601 (!) 153/90     Pulse Rate 08/15/18 1601 85     Resp 08/15/18 1601 14     Temp 08/15/18 1601 97.8 F (36.6 C)     Temp Source 08/15/18 1601 Oral     SpO2 08/15/18 1601 97 %     Weight 08/15/18 1548 157 lb (71.2 kg)     Height 08/15/18 1548 5' 2.5" (1.588 m)     Head Circumference --      Peak Flow --      Pain Score 08/15/18 1548 0     Pain Loc --      Pain Edu? --      Excl.  in [place]? --     Constitutional: Alert and oriented. Well appearing and in no apparent distress. HEENT:      Head: Normocephalic and atraumatic.         Eyes: Conjunctivae are normal. Sclera is non-icteric.       Mouth/Throat: Mucous membranes are moist.       Neck: Supple with no signs of meningismus. No bruits Cardiovascular: Regular rate and rhythm. No murmurs, gallops, or rubs. 2+ symmetrical distal pulses are present in all extremities. No JVD. Respiratory: Normal respiratory effort. Lungs are clear to auscultation bilaterally. No wheezes, crackles, or rhonchi.  Gastrointestinal: Soft, non tender, and non distended with positive bowel sounds. No rebound or guarding. Musculoskeletal: Nontender with normal range of motion in all extremities. No edema, cyanosis, or erythema of extremities. Neurologic: Normal speech and language. A & O x3, PERRL, EOMI, no nystagmus, CN II-XII intact, motor testing reveals good tone and bulk throughout. There is no evidence of pronator drift or dysmetria. Muscle strength is 5/5 throughout. Sensory examination is intact. Gait is ataxic and patient requires a lot of help to minimal ambulation Skin: Skin is warm, dry and intact. No rash noted. Psychiatric: Mood and affect are normal. Speech and behavior are normal.  ____________________________________________   LABS (all labs ordered are listed, but only abnormal results are displayed)  Labs Reviewed  CBC - Abnormal; Notable for the following components:      Result Value   Platelets 149 (*)    All other components within normal limits  BASIC METABOLIC PANEL - Abnormal; Notable for the following components:   Glucose, Bld 162 (*)    Creatinine, Ser 0.37 (*)    All other components within normal limits  URINALYSIS, COMPLETE (UACMP) WITH MICROSCOPIC - Abnormal; Notable for the following components:   Color, Urine STRAW (*)    APPearance CLEAR (*)    Nitrite POSITIVE (*)    Leukocytes, UA SMALL (*)     Bacteria, UA RARE (*)    All other components within normal limits  URINE CULTURE  TROPONIN I  BASIC METABOLIC PANEL  CBC   ____________________________________________  EKG  ED ECG REPORT I, Rudene Re, the attending physician, personally viewed and interpreted this ECG.  Atrial fibrillation, rate of 84, normal intervals, normal axis, diffuse T wave flattening with no ST elevations or depressions.  Occasional paced spikes.  No significant changes when compared to prior. ____________________________________________  RADIOLOGY  I have personally reviewed the images performed during this visit and I agree with the Radiologist's read.   Interpretation by Radiologist:  Ct Angio Head W Or Wo Contrast  Result Date: 08/15/2018 CLINICAL DATA:  84 y/o  F; Vertigo, persistent, central. EXAM: CT ANGIOGRAPHY HEAD AND NECK TECHNIQUE: Multidetector CT imaging of the head and neck was performed using the standard protocol during bolus administration of intravenous contrast. Multiplanar CT image reconstructions and MIPs were obtained to evaluate the vascular anatomy. Carotid stenosis measurements (when applicable) are obtained utilizing NASCET criteria, using the distal internal carotid diameter as the denominator. CONTRAST:  15m OMNIPAQUE IOHEXOL 350 MG/ML SOLN COMPARISON:  08/15/2018 CT head. FINDINGS: CTA NECK FINDINGS Aortic arch: Bovine variant branching. Imaged portion shows no evidence of aneurysm or dissection. No significant stenosis of the major arch vessel origins. Aortic calcific atherosclerosis. Right carotid system: No evidence of dissection, stenosis (50% or greater) or occlusion. Calcific plaque of the carotid siphon without significant stenosis. Left carotid system: No evidence of dissection, stenosis (50% or greater) or occlusion. Calcific plaque of the carotid siphons with mild less than 50% proximal ICA stenosis. Vertebral arteries: Left dominant. No evidence of dissection,  stenosis (50% or greater) or occlusion. Skeleton: Moderate spondylosis of the cervical spine greatest at the C4-C7 levels. No high-grade bony canal stenosis. Other neck: Negative. Upper chest: Negative. Review of the MIP images confirms the above findings CTA HEAD FINDINGS Anterior circulation: No significant stenosis, proximal occlusion, aneurysm, or vascular malformation. Calcific atherosclerosis of carotid siphons with mild less than 50% bilateral paraclinoid stenosis. Posterior circulation: Severe right and moderate left P1 stenosis. Additional segments of moderate to severe stenosis are present in the distal bilateral PCA circulation. Patent basilar and vertebral arteries without significant stenosis. No dissection, large vessel occlusion, aneurysm. No vascular malformation. Venous sinuses: As permitted by contrast timing, patent. Anatomic variants: None significant. Delayed phase: No abnormal intracranial enhancement. Review of the MIP images confirms the above findings IMPRESSION: 1. Patent carotid and vertebral arteries. No dissection, aneurysm, or hemodynamically significant stenosis utilizing NASCET criteria. 2. Patent anterior and posterior intracranial circulation. No large vessel occlusion, aneurysm, or vascular malformation. Calcific atherosclerosis of the aorta and carotid systems. Mild bilateral paraclinoid stenosis. 3. Severe right and moderate left P1 stenosis. Multiple segments of moderate to severe stenosis in bilateral PCA circulation. Electronically Signed   By: LKristine GarbeM.D.   On: 08/15/2018 19:27   Ct Head Wo Contrast  Result Date: 08/15/2018 CLINICAL DATA:  Dizziness starting yesterday, worsened today, fall. EXAM: CT HEAD WITHOUT CONTRAST TECHNIQUE: Contiguous axial images were obtained from the base of the skull through the vertex without intravenous contrast. COMPARISON:  Head CT dated 07/07/2011. FINDINGS: Brain: Mild generalized age related volume loss with  commensurate dilatation of the ventricles and sulci. 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 Mild chronic small vessel ischemic changes within the deep periventricular white matter regions bilaterally. There is no mass, hemorrhage, edema or other evidence of acute parenchymal abnormality. No extra-axial hemorrhage. Vascular: Chronic calcified atherosclerotic changes of the large vessels at the skull base. No unexpected hyperdense vessel. Skull: Normal. Negative for fracture or focal lesion. Sinuses/Orbits: No acute finding. Other:  None. IMPRESSION: 1. No acute findings. No intracranial mass, hemorrhage or edema. 2. Chronic ischemic changes, as detailed above. Electronically Signed   By: Franki Cabot M.D.   On: 08/15/2018 16:38   Ct Angio Neck W And/or Wo Contrast  Result Date: 08/15/2018 CLINICAL DATA:  84 y/o  F; Vertigo, persistent, central. EXAM: CT ANGIOGRAPHY HEAD AND NECK TECHNIQUE: Multidetector CT imaging of the head and neck was performed using the standard protocol during bolus administration of intravenous contrast. Multiplanar CT image reconstructions and MIPs were obtained to evaluate the vascular anatomy. Carotid stenosis measurements (when applicable) are obtained utilizing NASCET criteria, using the distal internal carotid diameter as the denominator. CONTRAST:  19m OMNIPAQUE IOHEXOL 350 MG/ML SOLN COMPARISON:  08/15/2018 CT head. FINDINGS: CTA NECK FINDINGS Aortic arch: Bovine variant branching. Imaged portion shows no evidence of aneurysm or dissection. No significant stenosis of the major arch vessel origins. Aortic calcific atherosclerosis. Right carotid system: No evidence of dissection, stenosis (50% or greater) or occlusion. Calcific plaque of the carotid siphon without significant stenosis. Left carotid system: No evidence of dissection, stenosis (50% or greater) or occlusion. Calcific plaque of the carotid siphons with mild less than 50% proximal  ICA stenosis. Vertebral arteries: Left dominant. No evidence of dissection, stenosis (50% or greater) or occlusion. Skeleton: Moderate spondylosis of the cervical spine greatest at the C4-C7 levels. No high-grade bony canal stenosis. Other neck: Negative. Upper chest: Negative. Review of the MIP images confirms the above findings CTA HEAD FINDINGS Anterior circulation: No significant stenosis, proximal occlusion, aneurysm, or vascular malformation. Calcific atherosclerosis of carotid siphons with mild less than 50% bilateral paraclinoid stenosis. Posterior circulation: Severe right and moderate left P1 stenosis. Additional segments of moderate to severe stenosis are present in the distal bilateral PCA circulation. Patent basilar and vertebral arteries without significant stenosis. No dissection, large vessel occlusion, aneurysm. No vascular malformation. Venous sinuses: As permitted by contrast timing, patent. Anatomic variants: None significant. Delayed phase: No abnormal intracranial enhancement. Review of the MIP images confirms the above findings IMPRESSION: 1. Patent carotid and vertebral arteries. No dissection, aneurysm, or hemodynamically significant stenosis utilizing NASCET criteria. 2. Patent anterior and posterior intracranial circulation. No large vessel occlusion, aneurysm, or vascular malformation. Calcific atherosclerosis of the aorta and carotid systems. Mild bilateral paraclinoid stenosis. 3. Severe right and moderate left P1 stenosis. Multiple segments of moderate to severe stenosis in bilateral PCA circulation. Electronically Signed   By: LKristine GarbeM.D.   On: 08/15/2018 19:27     ____________________________________________   PROCEDURES  Procedure(s) performed: None Procedures Critical Care performed:  None ____________________________________________   INITIAL IMPRESSION / ASSESSMENT AND PLAN / ED COURSE  84y.o. female the history of atrial fibrillation not on  anticoagulation, sick sinus syndrome status post pacemaker, CAD, anemia, hyperlipidemia, macular degeneration, TIA who presents for evaluation of dizziness.  Patient symptoms and description makes me concern for vertigo.  Based on patient's age, comorbidities, the presence of constant more mild symptoms throughout the entire day today makes me concern for a central cause.  Patient also complaining of neck pain for a month.  Will start with a CT of the head and pursue further imaging with MRI of the head and neck to rule out cervical artery dissection or stroke.  We will interrogate pacemaker and monitor on telemetry for any signs of arrhythmias.  Will check labs to rule out anemia, dehydration, electrolyte abnormalities or UTI.  Will give Zofran for nausea and meclizine.  Clinical Course as of  Aug 15 2316  Mon Aug 15, 2018  2048 Patient unable to undergo MRI due to older pacemaker that is not compatible with MRI machine.   [CV]    Clinical Course User Index [CV] Rudene Re, MD   ED COURSE:  UA positive for UTI.  Given rocephin. No signs of sepsis. Culture sent. CT of the head and neck with no acute findings.  Patient continued to feel extremely dizzy and unable to ambulate therefore she was admitted to the hospitalist service for further evaluation  As part of my medical decision making, I reviewed the following data within the [place] History obtained from family, Nursing notes reviewed and incorporated, Labs reviewed , EKG interpreted , Old EKG reviewed, Old chart reviewed, Radiograph reviewed , Discussed with admitting physician , Notes from prior ED visits and [place] Controlled Substance Database    Pertinent labs & imaging results that were available during my care of the patient were reviewed by me and considered in my medical decision making (see chart for details).    ____________________________________________   FINAL CLINICAL IMPRESSION(S) / ED  DIAGNOSES  Final diagnoses:  Vertigo  Acute cystitis with hematuria      NEW MEDICATIONS STARTED DURING THIS VISIT:  ED Discharge Orders    None       Note:  This document was prepared using Dragon voice recognition software and may include unintentional dictation errors.    Rudene Re, MD 08/15/18 [phone]

## 2018-08-15 NOTE — ED Notes (Signed)
Call to daughter with room update

## 2018-08-15 NOTE — ED Notes (Signed)
Daughter Holly Rose is to be called first. Husband cannot hear well. Password Holly Rose

## 2018-08-15 NOTE — ED Notes (Signed)
Pacemaker interrogated by this RN  

## 2018-08-15 NOTE — ED Notes (Signed)
This RN attempted to start IV compatible for angio, no success, Annie Main, RN at bedside at this time to attempt IV start.

## 2018-08-15 NOTE — H&P (Signed)
[place] [facility] [place] NAME: Holly Rose    MR#:  [id number]  DATE OF BIRTH:  [birth year]  DATE OF ADMISSION:  08/15/2018  PRIMARY CARE PHYSICIAN: [name], MD   REQUESTING/REFERRING PHYSICIAN: Alfred Levins, MD  CHIEF COMPLAINT:   Chief Complaint  Patient presents with  . Dizziness    HISTORY OF PRESENT ILLNESS:  Holly Rose  is a 84 y.o. female who presents with chief complaint as above.  Patient states that she had significant episodes of "room spinning dizziness" since yesterday evening.  It got much worse through the night and this morning, so she came to the ED for evaluation.  She had significant nausea associated with this, and a couple of simple falls without any related trauma.  Here in the ED she was treated for possible vertigo with meclizine, antiemetics, and there was some concern for possible stroke.  Patient has a pacemaker and cannot get MRI, so CTA head and neck were done.  These do not show any acute stroke, but do show some questionable posterior circulation stenosis.  However, patient's UA also shows nitrite positive UTI, and she does state that she felt like she was developing UTI over the past couple of days.  Hospitalist were called for admission and further evaluation and treatment  PAST MEDICAL HISTORY:   Past Medical History:  Diagnosis Date  . Allergy   . Anemia   . Arthritis   . Atrial fibrillation ([place]) 2004  . Breast cancer, left ([place]) 10/2017   Lumpectomy and rad tx's.   Marland Kitchen CAD (coronary artery disease)   . Complication of anesthesia    hard to wake up with general anesthesia  . Diabetes ([place])   . Dysrhythmia    A-fib  . Heart attack ([place]) 2008  . Heart murmur   . History of kidney stones   . History of sick sinus syndrome    s/p pacemaker placement  . Hyperlipidemia   . Macular degeneration   . Neuropathy   . Neuropathy   . Pacemaker 06/2010  . Personal history of radiation therapy  11/2017   LEFT lumpectomy   . Squamous cell skin cancer   . TIA (transient ischemic attack)      PAST SURGICAL HISTORY:   Past Surgical History:  Procedure Laterality Date  . ABDOMINAL HYSTERECTOMY  1960's  . APPENDECTOMY  1947  . [place]  . BREAST BIOPSY Left 12/17/2016   SINGLE FRAGMENT OF ATYPICAL EPITHELIAL CELLS  . BREAST BIOPSY Left 04/08/2017  . BREAST EXCISIONAL BIOPSY Left 01/18/1986   Benign microcalcifications, [employer].  Marland Kitchen BREAST LUMPECTOMY Left 05/26/2017   13 mm,T1c, N0; ER/ PR+, Her 2 neu negative, HIGH RISK by [facility]int ;  Surgeon: [name], MD;  Location: [facility] ORS;  Service: General;  Laterality: Left;  . BREAST SURGERY  1988  . CARDIAC CATHETERIZATION  1989  . CATARACT EXTRACTION  1997 and 1998  . COLONOSCOPY WITH PROPOFOL N/A 09/12/2015   Procedure: COLONOSCOPY WITH PROPOFOL;  Surgeon: [name], MD;  Location: [facility] ENDOSCOPY;  Service: Endoscopy;  Laterality: N/A;  . KIDNEY STONE SURGERY  2018  . MOHS SURGERY  2019   forehead  . pace maker  2008  . [place]  . TONSILLECTOMY AND ADENOIDECTOMY  1950"s     SOCIAL HISTORY:   Social History   Tobacco Use  . Smoking status: Former Smoker  Years: 5.00    Last attempt to quit: 09/14/1968    Years since quitting: 49.9  . Smokeless tobacco: Never Used  . Tobacco comment: quit 1970  Substance Use Topics  . Alcohol use: No    Alcohol/week: 0.0 standard drinks     FAMILY HISTORY:   Family History  Problem Relation Age of Onset  . Stroke Mother   . Diabetes Mother   . Cancer Maternal Aunt        Breast Cancer  . Breast cancer Maternal Aunt   . Arthritis Maternal Grandmother   . Cancer Maternal Aunt        Lung Cancer  . Diabetes Son   . Cancer Son   . Kidney disease Neg Hx   . Bladder Cancer Neg Hx      DRUG ALLERGIES:   Allergies  Allergen Reactions  . Lyrica [Pregabalin] Swelling and Other (See Comments)    Other  reaction(s): SWELLING/EDEMA Sleepy, does not eat  . Neurontin [Gabapentin] Nausea And Vomiting and Other (See Comments)    disoriented  . Bactrim [Sulfamethoxazole-Trimethoprim]     Made her dizziness    MEDICATIONS AT HOME:   Prior to Admission medications   Medication Sig Start Date End Date Taking? Authorizing Provider  Aflibercept (EYLEA) 2 MG/0.05ML SOLN by Intravitreal route.    [provider]  alendronate (FOSAMAX) 70 MG tablet Take 1 tablet (70 mg total) by mouth once a week. Take with a full glass of water on an empty stomach. 07/21/18   [name], MD  aspirin 81 MG tablet Take 81 mg by mouth daily.    [provider]  Biotin w/ Vitamins C & E (HAIR/SKIN/NAILS PO) Take 1 tablet by mouth daily.    [provider]  blood glucose meter kit and supplies Per patient please dispense One touch Ultra2 meter.Use meter and supplies three times a day to check blood sugar. ICD10: E11.40 07/25/15   [name], MD  Cholecalciferol (D3-50 PO) Take 1 tablet by mouth daily.    [provider]  furosemide (LASIX) 20 MG tablet Take 20 mg by mouth daily.     [provider]  glucose blood (ONE TOUCH ULTRA TEST) test strip USE 1 STRIP TO CHECK GLUCOSE THREE TIMES DAILY 05/25/18   [name], MD  Lactobacillus (PROBIOTIC ACIDOPHILUS PO) Take 1 tablet by mouth daily.     [provider]  MAGNESIUM SULFATE PO Take 1 tablet by mouth daily.     [provider]  metFORMIN (GLUCOPHAGE-XR) 500 MG 24 hr tablet TAKE 2 TABLETS BY MOUTH TWICE DAILY 05/25/18   [name], MD  metoprolol (LOPRESSOR) 50 MG tablet Take 50 mg by mouth daily.  02/03/16   [provider]  Multiple Vitamins-Minerals (PRESERVISION AREDS 2 PO) Take 1 tablet by mouth daily.     [provider]  mupirocin ointment (BACTROBAN) 2 % Apply to wound after soaking BID 03/07/18   Hyatt, Max T, DPM  Polyethyl Glycol-Propyl Glycol (SYSTANE OP) Apply 1  drop to eye 2 (two) times daily.    [provider]  tamoxifen (NOLVADEX) 20 MG tablet Take 1 tablet (20 mg total) by mouth daily. 08/24/17   [name], MD  vitamin B-12 (CYANOCOBALAMIN) 1000 MCG tablet Take 1,000 mcg by mouth daily.    [provider]    REVIEW OF SYSTEMS:  Review of Systems  Constitutional: Negative for chills, fever, malaise/fatigue and weight loss.  HENT: Negative for ear  pain, hearing loss and tinnitus.   Eyes: Negative for blurred vision, double vision, pain and redness.  Respiratory: Negative for cough, hemoptysis and shortness of breath.   Cardiovascular: Negative for chest pain, palpitations, orthopnea and leg swelling.  Gastrointestinal: Positive for nausea. Negative for abdominal pain, constipation, diarrhea and vomiting.  Genitourinary: Positive for dysuria. Negative for frequency and hematuria.  Musculoskeletal: Positive for falls. Negative for back pain, joint pain and neck pain.  Skin:       No acne, rash, or lesions  Neurological: Positive for dizziness. Negative for tremors, focal weakness and weakness.  Endo/Heme/Allergies: Negative for polydipsia. Does not bruise/bleed easily.  Psychiatric/Behavioral: Negative for depression. The patient is not nervous/anxious and does not have insomnia.      VITAL SIGNS:   Vitals:   08/15/18 1601 08/15/18 1700 08/15/18 1800 08/15/18 1915  BP: (!) 153/90 (!) 162/95 (!) 145/91   Pulse: 85 63 91 80  Resp: 14 11    Temp: 97.8 F (36.6 C)     TempSrc: Oral     SpO2: 97% 94% 96% 95%  Weight:      Height:       Wt Readings from Last 3 Encounters:  08/15/18 71.2 kg  07/21/18 71.1 kg  07/15/18 71.3 kg    PHYSICAL EXAMINATION:  Physical Exam  Vitals reviewed. Constitutional: She is oriented to person, place, and time. She appears well-developed and well-nourished. No distress.  HENT:  Head: Normocephalic and atraumatic.  Mouth/Throat: Oropharynx is clear and moist.  Eyes: Pupils  are equal, round, and reactive to light. Conjunctivae and EOM are normal. No scleral icterus.  Neck: Normal range of motion. Neck supple. No JVD present. No thyromegaly present.  Cardiovascular: Normal rate, regular rhythm and intact distal pulses. Exam reveals no gallop and no friction rub.  No murmur heard. Respiratory: Effort normal and breath sounds normal. No respiratory distress. She has no wheezes. She has no rales.  GI: Soft. Bowel sounds are normal. She exhibits no distension. There is no tenderness.  Musculoskeletal: Normal range of motion. She exhibits no edema.  No arthritis, no gout  Lymphadenopathy:    She has no cervical adenopathy.  Neurological: She is alert and oriented to person, place, and time. No cranial nerve deficit.  Neurologic: Cranial nerves II-XII intact, Sensation intact to light touch/pinprick, 5/5 strength in all extremities, no dysarthria, no aphasia, no dysphagia, memory intact, no pronator drift  Skin: Skin is warm and dry. No rash noted. No erythema.  Psychiatric: She has a normal mood and affect. Her behavior is normal. Judgment and thought content normal.    LABORATORY PANEL:   CBC Recent Labs  Lab 08/15/18 1608  WBC 4.8  HGB 12.1  HCT 38.2  PLT 149*   ------------------------------------------------------------------------------------------------------------------  Chemistries  Recent Labs  Lab 08/15/18 1608  NA 137  K 4.1  CL 107  CO2 23  GLUCOSE 162*  BUN 10  CREATININE 0.37*  CALCIUM 9.5   ------------------------------------------------------------------------------------------------------------------  Cardiac Enzymes Recent Labs  Lab 08/15/18 1608  TROPONINI <0.03   ------------------------------------------------------------------------------------------------------------------  RADIOLOGY:  Ct Head Wo Contrast  Result Date: 08/15/2018 CLINICAL DATA:  Dizziness starting yesterday, worsened today, fall. EXAM: CT HEAD  WITHOUT CONTRAST TECHNIQUE: Contiguous axial images were obtained from the base of the skull through the vertex without intravenous contrast. COMPARISON:  Head CT dated 07/07/2011. FINDINGS: Brain: Mild generalized age related volume loss with commensurate dilatation of the ventricles and sulci. 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 Mild chronic small vessel ischemic changes within the deep periventricular white matter regions bilaterally. There is no mass, hemorrhage, edema or other evidence of acute parenchymal abnormality. No extra-axial hemorrhage. Vascular: Chronic calcified atherosclerotic changes of the large vessels at the skull base. No unexpected hyperdense vessel. Skull: Normal. Negative for fracture or focal lesion. Sinuses/Orbits: No acute finding. Other: None. IMPRESSION: 1. No acute findings. No intracranial mass, hemorrhage or edema. 2. Chronic ischemic changes, as detailed above. Electronically Signed   By: Franki Cabot M.D.   On: 08/15/2018 16:38    EKG:   Orders placed or performed during the hospital encounter of 08/15/18  . EKG 12-Lead  . EKG 12-Lead    IMPRESSION AND PLAN:  Principal Problem:   UTI (urinary tract infection) -strong suspicion that her symptoms may be driven primarily by her UTI.  IV antibiotics started, urine culture sent.  However, given the posterior stenosis found on CTA head and neck, it would be advisable to have a neurology consult if possible.  [facility] scheduling shows no in-house neurologist today or tomorrow, though there is a number to call [phone] Active Problems:   CAD (coronary artery disease) -continue home meds   Atrial fibrillation, chronic -continue home rate controlling medications   Diabetes mellitus with neuropathy (HCC) -sliding scale insulin coverage   Malignant neoplasm of upper-outer quadrant of left breast in female, estrogen receptor positive ([place]) -continue home dose tamoxifen  Chart  review performed and case discussed with ED provider. Labs, imaging and/or ECG reviewed by provider and discussed with patient/family. Management plans discussed with the patient and/or family.  DVT PROPHYLAXIS: SubQ lovenox   GI PROPHYLAXIS:  None  ADMISSION STATUS: Inpatient     CODE STATUS: Full Advance Directive Documentation     Most Recent Value  Type of Advance Directive  Living will  Pre-existing out of facility DNR order (yellow form or pink MOST form)  -  "MOST" Form in Place?  -      TOTAL TIME TAKING CARE OF THIS PATIENT: 45 minutes.   Jannifer Franklin, Fiorella Hanahan FIELDING 08/15/2018, 9:25 PM  [facility]  Office  [phone]  CC: Primary care physician; [name], MD  Note:  This document was prepared using Dragon voice recognition software and may include unintentional dictation errors.

## 2018-08-15 NOTE — ED Notes (Signed)
ED Provider at bedside. 

## 2018-08-15 NOTE — ED Notes (Signed)
Pt transported to CT at this time.

## 2018-08-15 NOTE — ED Triage Notes (Signed)
Pt presents to ED via [facility] with c/o dizziness that started yesterday. Per EMS dizziness has worsened today and pt had fall from dizziness with no injuries to report. Pt arrives alert and oriented at this time. Per EMS negative orthostatic VS at this time.

## 2018-08-16 ENCOUNTER — Encounter: Payer: Self-pay | Admitting: Internal Medicine

## 2018-08-16 LAB — BASIC METABOLIC PANEL
Anion gap: 6 (ref 5–15)
BUN: 8 mg/dL (ref 8–23)
CO2: 26 mmol/L (ref 22–32)
Calcium: 9.2 mg/dL (ref 8.9–10.3)
Chloride: 109 mmol/L (ref 98–111)
Creatinine, Ser: 0.5 mg/dL (ref 0.44–1.00)
GFR calc Af Amer: >60 mL/min (ref >60)
GFR calc non Af Amer: >60 mL/min (ref >60)
Glucose, Bld: 101 mg/dL — ABNORMAL HIGH (ref 70–99)
Potassium: 3.8 mmol/L (ref 3.5–5.1)
Sodium: 141 mmol/L (ref 135–145)

## 2018-08-16 LAB — CBC
HCT: 37 % (ref 36.0–46.0)
Hemoglobin: 11.8 g/dL — ABNORMAL LOW (ref 12.0–15.0)
MCH: 29.1 pg (ref 26.0–34.0)
MCHC: 31.9 g/dL (ref 30.0–36.0)
MCV: 91.4 fL (ref 80.0–100.0)
Platelets: 141 10*3/uL — ABNORMAL LOW (ref 150–400)
RBC: 4.05 MIL/uL (ref 3.87–5.11)
RDW: 14.7 % (ref 11.5–15.5)
WBC: 5 10*3/uL (ref 4.0–10.5)
nRBC: 0 % (ref 0.0–0.2)

## 2018-08-16 LAB — GLUCOSE, CAPILLARY
Glucose-Capillary: 107 mg/dL — ABNORMAL HIGH (ref 70–99)
Glucose-Capillary: 129 mg/dL — ABNORMAL HIGH (ref 70–99)
Glucose-Capillary: 176 mg/dL — ABNORMAL HIGH (ref 70–99)
Glucose-Capillary: 120 mg/dL — ABNORMAL HIGH (ref 70–99)

## 2018-08-16 MED ORDER — MECLIZINE HCL 25 MG PO TABS
25.0000 mg | ORAL_TABLET | Freq: Three times a day (TID) | ORAL | Status: DC | PRN
  Administered 2018-08-16: 25 mg via ORAL
  Filled 2018-08-16 (×2): qty 1

## 2018-08-16 MED ORDER — SODIUM CHLORIDE 0.9 % IV SOLN
INTRAVENOUS | Status: DC
  Administered 2018-08-16: 01:00:00 via INTRAVENOUS

## 2018-08-16 MED ORDER — METOPROLOL TARTRATE 25 MG PO TABS
25.0000 mg | ORAL_TABLET | Freq: Two times a day (BID) | ORAL | Status: DC
  Administered 2018-08-17: 25 mg via ORAL
  Filled 2018-08-16: qty 1

## 2018-08-16 MED ORDER — SODIUM CHLORIDE 0.9 % IV SOLN
1.0000 g | INTRAVENOUS | Status: DC
  Administered 2018-08-16: 1 g via INTRAVENOUS
  Filled 2018-08-16: qty 1
  Filled 2018-08-16: qty 10

## 2018-08-16 NOTE — Evaluation (Signed)
Physical Therapy Evaluation Patient Details Name: Holly Rose MRN: [id number] DOB: 02/03/1930 Today's Date: 08/16/2018   History of Present Illness  84 yo female with onset of dizziness and listing feeling to the L was admitted and today had some Meclazine wiht some relief.  Her testing reveals no BP drops, no pulses that were unusual and O2 sats are all 90+%.  Noted PCA's are moderately stenotic on imaging.  PMHx:  breast CA, pacemaker, a-fib, CAD, DM, MI, SSS, macular degeneration, TIA, PN  Clinical Impression  Pt was seen for evaluation and noted BP's were as follow:  Sitting 127/83, pulse 74 and sats 93%; standing 138/77, 95% and pulse 79 with nursing asking for another standing and was 124/66 and pulse 67.  No vital issues but noted her mobility requires RW and walked a distance before listing to L feeling arrived.  Pt is going to receive PT screen for vestibular eval, but did not see nystagmus on initial movement nor did her symptoms arrive until after her trip to [place] and then walk.  Follow acutely for balance and endurance training with monitoring of symptoms.    Follow Up Recommendations Home health PT;Supervision for mobility/OOB    Equipment Recommendations  Rolling walker with 5" wheels(if hers is not in good repair)    Recommendations for Other Services       Precautions / Restrictions Precautions Precautions: Fall;ICD/Pacemaker Restrictions Weight Bearing Restrictions: No      Mobility  Bed Mobility Overal bed mobility: Modified Independent                Transfers Overall transfer level: Needs assistance Equipment used: Rolling walker (2 wheeled) Transfers: Sit to/from Stand Sit to Stand: Min guard;Min assist         General transfer comment: min assist to power up and min guard to steady  Ambulation/Gait Ambulation/Gait assistance: Min guard;Min assist Gait Distance (Feet): 90 Feet Assistive device: Rolling walker (2 wheeled);1 person hand held  assist Gait Pattern/deviations: Step-to pattern;Step-through pattern;Decreased stride length;Wide base of support;Drifts right/left(drifts L with feeling she is leaning after starting off on w) Gait velocity: reduced Gait velocity interpretation: <1.31 ft/sec, indicative of household ambulator General Gait Details: listing L feeling on the hallway  Stairs            Wheelchair Mobility    Modified Rankin (Stroke Patients Only)       Balance Overall balance assessment: Needs assistance Sitting-balance support: Feet supported Sitting balance-Leahy Scale: Good     Standing balance support: Bilateral upper extremity supported Standing balance-Leahy Scale: Fair Standing balance comment: less than fair only when her listing feeling started on hallway                             Pertinent Vitals/Pain Pain Assessment: No/denies pain    Home Living Family/patient expects to be discharged to:: Private residence Living Arrangements: Spouse/significant other Available Help at Discharge: Family;Available PRN/intermittently;Available 24 hours/day Type of Home: House Home Access: Level entry     Home Layout: One level Home Equipment: Walker - 2 wheels;Cane - single point Additional Comments: typically uses RW at home    Prior Function Level of Independence: Independent with assistive device(s)               Hand Dominance   Dominant Hand: Right    Extremity/Trunk Assessment   Upper Extremity Assessment Upper Extremity Assessment: Overall WFL for tasks assessed  Lower Extremity Assessment Lower Extremity Assessment: Overall WFL for tasks assessed    Cervical / Trunk Assessment Cervical / Trunk Assessment: Kyphotic  Communication   Communication: No difficulties  Cognition Arousal/Alertness: Awake/alert Behavior During Therapy: WFL for tasks assessed/performed Overall Cognitive Status: Within Functional Limits for tasks assessed                                         General Comments      Exercises     Assessment/Plan    PT Assessment Patient needs continued PT services  PT Problem List Decreased activity tolerance;Decreased balance;Decreased mobility;Decreased knowledge of use of DME;Decreased safety awareness;Other (comment)(cleared cardiopulm wiht BP, pulses and O2 sats)       PT Treatment Interventions DME instruction;Gait training;Functional mobility training;Therapeutic activities;Therapeutic exercise;Balance training;Neuromuscular re-education;Patient/family education    PT Goals (Current goals can be found in the Care Plan section)  Acute Rehab PT Goals Patient Stated Goal: to walk and return to home PT Goal Formulation: With patient/family Time For Goal Achievement: 08/30/18 Potential to Achieve Goals: Good    Frequency Min 2X/week   Barriers to discharge   needs supervised gait    Co-evaluation               AM-PAC PT "6 Clicks" Mobility  Outcome Measure Help needed turning from your back to your side while in a flat bed without using bedrails?: None Help needed moving from lying on your back to sitting on the side of a flat bed without using bedrails?: A Little Help needed moving to and from a bed to a chair (including a wheelchair)?: A Little Help needed standing up from a chair using your arms (e.g., wheelchair or bedside chair)?: A Little Help needed to walk in hospital room?: A Little Help needed climbing 3-5 steps with a railing? : A Little 6 Click Score: 19    End of Session Equipment Utilized During Treatment: Gait belt Activity Tolerance: Treatment limited secondary to medical complications (Comment);Patient tolerated treatment well Patient left: in bed;with call bell/phone within reach;with family/visitor present;with nursing/sitter in room Nurse Communication: Mobility status PT Visit Diagnosis: Dizziness and giddiness (R42);Other abnormalities of gait and mobility  (R26.89)    Time: 1194-1740 PT Time Calculation (min) (ACUTE ONLY): 30 min   Charges:   PT Evaluation $PT Eval Moderate Complexity: 1 Mod PT Treatments $Gait Training: 8-22 mins       Ramond Dial 08/16/2018, 1:36 PM   Mee Hives, PT MS Acute Rehab Dept. Number: [place] [facility] [place]

## 2018-08-16 NOTE — Progress Notes (Signed)
[facility] [place] NAME: Holly Rose    MR#:  [id number]  DATE OF BIRTH:  [birth year]  SUBJECTIVE:   Patient admitted to the hospital due to persistent dizziness.  Patient still complains of dizziness on trying to stand up.  Denies any nausea or vomiting this morning.  Patient's family is at bedside.  Patient CT head and CT of the head neck is negative.  REVIEW OF SYSTEMS:    Review of Systems  Constitutional: Negative for chills and fever.  HENT: Negative for congestion and tinnitus.   Eyes: Negative for blurred vision and double vision.  Respiratory: Negative for cough, shortness of breath and wheezing.   Cardiovascular: Negative for chest pain, orthopnea and PND.  Gastrointestinal: Negative for abdominal pain, diarrhea, nausea and vomiting.  Genitourinary: Negative for dysuria and hematuria.  Neurological: Positive for dizziness. Negative for sensory change and focal weakness.  All other systems reviewed and are negative.   Nutrition: heart healthy/Carb control Tolerating Diet: yes Tolerating PT: Eval noted.   DRUG ALLERGIES:   Allergies  Allergen Reactions  . Lyrica [Pregabalin] Swelling and Other (See Comments)    Other reaction(s): SWELLING/EDEMA Sleepy, does not eat  . Neurontin [Gabapentin] Nausea And Vomiting and Other (See Comments)    disoriented  . Bactrim [Sulfamethoxazole-Trimethoprim]     Made her dizziness    VITALS:  Blood pressure 127/83, pulse 73, temperature 98.2 F (36.8 C), temperature source Oral, resp. rate 20, height 5' 2.5" (1.588 m), weight 71.2 kg, SpO2 94 %.  PHYSICAL EXAMINATION:   Physical Exam  GENERAL:  84 y.o.-year-old patient lying in bed in no acute distress.  EYES: Pupils equal, round, reactive to light and accommodation. No scleral icterus. Extraocular muscles intact.  HEENT: Head atraumatic, normocephalic. Oropharynx and nasopharynx clear.  NECK:  Supple, no jugular venous  distention. No thyroid enlargement, no tenderness.  LUNGS: Normal breath sounds bilaterally, no wheezing, rales, rhonchi. No use of accessory muscles of respiration.  CARDIOVASCULAR: S1, S2 normal. No murmurs, rubs, or gallops.  ABDOMEN: Soft, nontender, nondistended. Bowel sounds present. No organomegaly or mass.  EXTREMITIES: No cyanosis, clubbing or edema b/l.    NEUROLOGIC: Cranial nerves II through XII are intact. No focal Motor or sensory deficits b/l.   PSYCHIATRIC: The patient is alert and oriented x 3.  SKIN: No obvious rash, lesion, or ulcer.    LABORATORY PANEL:   CBC Recent Labs  Lab 08/16/18 0429  WBC 5.0  HGB 11.8*  HCT 37.0  PLT 141*   ------------------------------------------------------------------------------------------------------------------  Chemistries  Recent Labs  Lab 08/16/18 0429  NA 141  K 3.8  CL 109  CO2 26  GLUCOSE 101*  BUN 8  CREATININE 0.50  CALCIUM 9.2   ------------------------------------------------------------------------------------------------------------------  Cardiac Enzymes Recent Labs  Lab 08/15/18 1608  TROPONINI <0.03   ------------------------------------------------------------------------------------------------------------------  RADIOLOGY:  Ct Angio Head W Or Wo Contrast  Result Date: 08/15/2018 CLINICAL DATA:  84 y/o  F; Vertigo, persistent, central. EXAM: CT ANGIOGRAPHY HEAD AND NECK TECHNIQUE: Multidetector CT imaging of the head and neck was performed using the standard protocol during bolus administration of intravenous contrast. Multiplanar CT image reconstructions and MIPs were obtained to evaluate the vascular anatomy. Carotid stenosis measurements (when applicable) are obtained utilizing NASCET criteria, using the distal internal carotid diameter as the denominator. CONTRAST:  41mL OMNIPAQUE IOHEXOL 350 MG/ML SOLN COMPARISON:  08/15/2018 CT head. FINDINGS: CTA NECK FINDINGS Aortic arch: Bovine variant  branching. Imaged  portion shows no evidence of aneurysm or dissection. No significant stenosis of the major arch vessel origins. Aortic calcific atherosclerosis. Right carotid system: No evidence of dissection, stenosis (50% or greater) or occlusion. Calcific plaque of the carotid siphon without significant stenosis. Left carotid system: No evidence of dissection, stenosis (50% or greater) or occlusion. Calcific plaque of the carotid siphons with mild less than 50% proximal ICA stenosis. Vertebral arteries: Left dominant. No evidence of dissection, stenosis (50% or greater) or occlusion. Skeleton: Moderate spondylosis of the cervical spine greatest at the C4-C7 levels. No high-grade bony canal stenosis. Other neck: Negative. Upper chest: Negative. Review of the MIP images confirms the above findings CTA HEAD FINDINGS Anterior circulation: No significant stenosis, proximal occlusion, aneurysm, or vascular malformation. Calcific atherosclerosis of carotid siphons with mild less than 50% bilateral paraclinoid stenosis. Posterior circulation: Severe right and moderate left P1 stenosis. Additional segments of moderate to severe stenosis are present in the distal bilateral PCA circulation. Patent basilar and vertebral arteries without significant stenosis. No dissection, large vessel occlusion, aneurysm. No vascular malformation. Venous sinuses: As permitted by contrast timing, patent. Anatomic variants: None significant. Delayed phase: No abnormal intracranial enhancement. Review of the MIP images confirms the above findings IMPRESSION: 1. Patent carotid and vertebral arteries. No dissection, aneurysm, or hemodynamically significant stenosis utilizing NASCET criteria. 2. Patent anterior and posterior intracranial circulation. No large vessel occlusion, aneurysm, or vascular malformation. Calcific atherosclerosis of the aorta and carotid systems. Mild bilateral paraclinoid stenosis. 3. Severe right and moderate left P1  stenosis. Multiple segments of moderate to severe stenosis in bilateral PCA circulation. Electronically Signed   By: Kristine Garbe M.D.   On: 08/15/2018 19:27   Ct Head Wo Contrast  Result Date: 08/15/2018 CLINICAL DATA:  Dizziness starting yesterday, worsened today, fall. EXAM: CT HEAD WITHOUT CONTRAST TECHNIQUE: Contiguous axial images were obtained from the base of the skull through the vertex without intravenous contrast. COMPARISON:  Head CT dated 07/07/2011. FINDINGS: Brain: Mild generalized age related volume loss with commensurate dilatation of the ventricles and sulci. 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 Mild chronic small vessel ischemic changes within the deep periventricular white matter regions bilaterally. There is no mass, hemorrhage, edema or other evidence of acute parenchymal abnormality. No extra-axial hemorrhage. Vascular: Chronic calcified atherosclerotic changes of the large vessels at the skull base. No unexpected hyperdense vessel. Skull: Normal. Negative for fracture or focal lesion. Sinuses/Orbits: No acute finding. Other: None. IMPRESSION: 1. No acute findings. No intracranial mass, hemorrhage or edema. 2. Chronic ischemic changes, as detailed above. Electronically Signed   By: Franki Cabot M.D.   On: 08/15/2018 16:38   Ct Angio Neck W And/or Wo Contrast  Result Date: 08/15/2018 CLINICAL DATA:  83 y/o  F; Vertigo, persistent, central. EXAM: CT ANGIOGRAPHY HEAD AND NECK TECHNIQUE: Multidetector CT imaging of the head and neck was performed using the standard protocol during bolus administration of intravenous contrast. Multiplanar CT image reconstructions and MIPs were obtained to evaluate the vascular anatomy. Carotid stenosis measurements (when applicable) are obtained utilizing NASCET criteria, using the distal internal carotid diameter as the denominator. CONTRAST:  71mL OMNIPAQUE IOHEXOL 350 MG/ML SOLN COMPARISON:   08/15/2018 CT head. FINDINGS: CTA NECK FINDINGS Aortic arch: Bovine variant branching. Imaged portion shows no evidence of aneurysm or dissection. No significant stenosis of the major arch vessel origins. Aortic calcific atherosclerosis. Right carotid system: No evidence of dissection, stenosis (50% or greater) or occlusion. Calcific plaque of the carotid siphon  without significant stenosis. Left carotid system: No evidence of dissection, stenosis (50% or greater) or occlusion. Calcific plaque of the carotid siphons with mild less than 50% proximal ICA stenosis. Vertebral arteries: Left dominant. No evidence of dissection, stenosis (50% or greater) or occlusion. Skeleton: Moderate spondylosis of the cervical spine greatest at the C4-C7 levels. No high-grade bony canal stenosis. Other neck: Negative. Upper chest: Negative. Review of the MIP images confirms the above findings CTA HEAD FINDINGS Anterior circulation: No significant stenosis, proximal occlusion, aneurysm, or vascular malformation. Calcific atherosclerosis of carotid siphons with mild less than 50% bilateral paraclinoid stenosis. Posterior circulation: Severe right and moderate left P1 stenosis. Additional segments of moderate to severe stenosis are present in the distal bilateral PCA circulation. Patent basilar and vertebral arteries without significant stenosis. No dissection, large vessel occlusion, aneurysm. No vascular malformation. Venous sinuses: As permitted by contrast timing, patent. Anatomic variants: None significant. Delayed phase: No abnormal intracranial enhancement. Review of the MIP images confirms the above findings IMPRESSION: 1. Patent carotid and vertebral arteries. No dissection, aneurysm, or hemodynamically significant stenosis utilizing NASCET criteria. 2. Patent anterior and posterior intracranial circulation. No large vessel occlusion, aneurysm, or vascular malformation. Calcific atherosclerosis of the aorta and carotid systems.  Mild bilateral paraclinoid stenosis. 3. Severe right and moderate left P1 stenosis. Multiple segments of moderate to severe stenosis in bilateral PCA circulation. Electronically Signed   By: Kristine Garbe M.D.   On: 08/15/2018 19:27     ASSESSMENT AND PLAN:   84 year old female with past medical history of sick sinus syndrome status post pacemaker, diabetes, atrial fibrillation, history of breast cancer, coronary artery disease, hyperlipidemia, macular degeneration who presented to the hospital due to persistent dizziness.  1.  Dizziness- suspected to be secondary to vertigo. -CT head is negative for acute stroke, CTA head neck shows some intracranial stenosis but no other acute pathology.  Continue meclizine as needed, will get physical therapy consult for vestibular therapy. - Patient may benefit from outpatient ENT evaluation.  Will repeat orthostatic vital signs  2.  Urinary tract infection- based off a urinalysis on admission.  - cont. Ceftriaxone. Follow urine cultures.   3.  History of breast cancer-continue tamoxifen.  4.  History of atrial fibrillation-rate controlled, continue metoprolol. - cont. ASA  5.  Diabetes type 2 without complication- continue sliding scale insulin. - hold metformin for now.  Likely discharge home tomorrow if feeling better.    All the records are reviewed and case discussed with Care Management/Social Worker. Management plans discussed with the patient, family and they are in agreement.  CODE STATUS: Full code  DVT Prophylaxis: Lovenox  TOTAL TIME TAKING CARE OF THIS PATIENT: 30 minutes.   POSSIBLE D/C IN 1-2 DAYS, DEPENDING ON CLINICAL CONDITION.   Henreitta Leber M.D on 08/16/2018 at 2:58 PM  Between 7am to 6pm - Pager - [phone]  After 6pm go to [email or web address] - Proofreader  [facility]  Office  [phone]  CC: Primary care physician; [name], MD

## 2018-08-16 NOTE — Progress Notes (Signed)
Tried calling sister at [phone] from a note left a nurses station to call her, got a recording voice mail is not set up

## 2018-08-16 NOTE — Care Management Note (Signed)
Case Management Note  Patient Details  Name: Holly Rose MRN: [id number] Date of Birth: 07-02-1930  Subjective/Objective:     Admitted to [facility] with the diagnosis of urinary tract infection.. . Lives with husband at [place] in a cottage x 15 years. Daughter is [name] [phone]). Next appointment scheduled with [name] on 08/18/18. Prescriptions are filled at [facility] on [address] care of all basic activities of daily living herself, doesn't drive.No home Health. No skilled facility. No home oxygen. States she just finished up 6 weeks of outpatient physical therapy. Cane and rolling walker in the home. [place] prior to this admission. Good appetite. Family will transport.               Action/Plan:Physical therapy is recommending home with home health/physical therapy. Undecided if she wants these services at this time. Will check again on disposition tomorrow  Expected Discharge Date:                  Expected Discharge Plan:     In-House Referral:   yes  Discharge planning Services   yes  Post Acute Care Choice:   yes Choice offered to:   patient  DME Arranged:    DME Agency:     HH Arranged:    HH Agency:     Status of Service:     If discussed at [employer] of Stay Meetings, dates discussed:    Additional Comments:  Shelbie Ammons, RN MSN CCM Care Management [phone] 08/16/2018, 2:23 PM

## 2018-08-16 NOTE — Plan of Care (Signed)
  Problem: Education: Goal: Knowledge of General Education information will improve Description Including pain rating scale, medication(s)/side effects and non-pharmacologic comfort measures Outcome: Progressing   Problem: Clinical Measurements: Goal: Ability to maintain clinical measurements within normal limits will improve Outcome: Progressing   Problem: Elimination: Goal: Will not experience complications related to urinary retention Outcome: Progressing   Problem: Safety: Goal: Ability to remain free from injury will improve Outcome: Progressing   Problem: Skin Integrity: Goal: Risk for impaired skin integrity will decrease Outcome: Progressing   Problem: Urinary Elimination: Goal: Signs and symptoms of infection will decrease Outcome: Progressing

## 2018-08-17 LAB — GLUCOSE, CAPILLARY: Glucose-Capillary: 112 mg/dL — ABNORMAL HIGH (ref 70–99)

## 2018-08-17 MED ORDER — MECLIZINE HCL 25 MG PO TABS: 25.0000 mg | ORAL_TABLET | Freq: Three times a day (TID) | ORAL | 0 refills | Status: DC | PRN

## 2018-08-17 MED ORDER — CEFUROXIME AXETIL 250 MG PO TABS: 250.0000 mg | ORAL_TABLET | Freq: Two times a day (BID) | ORAL | 0 refills | Status: AC

## 2018-08-17 NOTE — Progress Notes (Signed)
Discharge instructions given to pt and family. All verbalize understanding of information given. Pt discharged home with family. Escorted to vehicle in wheelchair by nursing staff.

## 2018-08-17 NOTE — Telephone Encounter (Signed)
Daughter says patient very tired and she feels better to cancel appointment and reschedule that she is just tired to come in at this time. Advised once rescheduled we call with appointment.

## 2018-08-17 NOTE — Progress Notes (Signed)
[facility]  Address: [address] [place], [place] [address]  Phone:[phone]  PCP: [name]  Goal: get up and not be too tired while walking   If patient discharges after hours, please call [phone].   Florene Glen 08/17/2018, 12:26 PM

## 2018-08-17 NOTE — Telephone Encounter (Signed)
Ok to cancel.  To my knowledge she has not been discharged yet.  Once discharged, will need to do TCM and then schedule.  Thanks.

## 2018-08-17 NOTE — Discharge Summary (Addendum)
[facility] [place] NAME: Holly Rose    MR#:  [id number]  DATE OF BIRTH:  [birth year]  DATE OF ADMISSION:  08/15/2018   ADMITTING PHYSICIAN: Lance Coon, MD  DATE OF DISCHARGE: 08/17/2018  1:44 PM  PRIMARY CARE PHYSICIAN: [name], MD   ADMISSION DIAGNOSIS:  Vertigo [R42] Acute cystitis with hematuria [N30.01] DISCHARGE DIAGNOSIS:  Principal Problem:   UTI (urinary tract infection) Active Problems:   CAD (coronary artery disease)   Atrial fibrillation, chronic   Diabetes mellitus with neuropathy (HCC)   Malignant neoplasm of upper-outer quadrant of left breast in female, estrogen receptor positive ([place])  SECONDARY DIAGNOSIS:   Past Medical History:  Diagnosis Date  . Allergy   . Anemia   . Arthritis   . Atrial fibrillation ([place]) 2004  . Breast cancer, left ([place]) 10/2017   Lumpectomy and rad tx's.   Marland Kitchen CAD (coronary artery disease)   . Complication of anesthesia    hard to wake up with general anesthesia  . Diabetes ([place])   . Dysrhythmia    A-fib  . Heart attack ([place]) 2008  . Heart murmur   . History of kidney stones   . History of sick sinus syndrome    s/p pacemaker placement  . Hyperlipidemia   . Macular degeneration   . Neuropathy   . Neuropathy   . Pacemaker 06/2010  . Personal history of radiation therapy 11/2017   LEFT lumpectomy   . Squamous cell skin cancer   . TIA (transient ischemic attack)    HOSPITAL COURSE:  84 year old female with past medical history of sick sinus syndrome status post pacemaker, diabetes, atrial fibrillation, history of breast cancer, coronary artery disease, hyperlipidemia, macular degeneration who presented to the hospital due to persistent dizziness.  1.  Dizziness- suspected to be secondary to vertigo. -CT head is negative for acute stroke, CTA head neck shows some intracranial stenosis but no other acute pathology.  Continue meclizine as needed, home health PT per  physical therapy consult  - Patient may benefit from outpatient ENT evaluation.  2.  Urinary tract infection- based off a urinalysis on admission.  She has been treated with ceftriaxone. Follow urine cultures: GNR.  Changed to ceftin for 3 more days.  The patient denies any dysuria, hematuria, urinary frequency or urgency.  3.  History of breast cancer-continue tamoxifen.  4.  History of atrial fibrillation-rate controlled, continue metoprolol. - cont. ASA  5.  Diabetes type 2 without complication- continue sliding scale insulin. Resume metformin after discharge. DISCHARGE CONDITIONS:  Stable, discharge to home with home health and PT today. CONSULTS OBTAINED:   DRUG ALLERGIES:   Allergies  Allergen Reactions  . Lyrica [Pregabalin] Swelling and Other (See Comments)    Other reaction(s): SWELLING/EDEMA Sleepy, does not eat  . Neurontin [Gabapentin] Nausea And Vomiting and Other (See Comments)    disoriented  . Bactrim [Sulfamethoxazole-Trimethoprim]     Made her dizziness   DISCHARGE MEDICATIONS:   Allergies as of 08/17/2018      Reactions   Lyrica [pregabalin] Swelling, Other (See Comments)   Other reaction(s): SWELLING/EDEMA Sleepy, does not eat   Neurontin [gabapentin] Nausea And Vomiting, Other (See Comments)   disoriented   Bactrim [sulfamethoxazole-trimethoprim]    Made her dizziness      Medication List    TAKE these medications   aspirin 81 MG tablet Take 81 mg by mouth daily.   blood glucose meter kit and supplies Per  patient please dispense One touch Ultra2 meter.Use meter and supplies three times a day to check blood sugar. ICD10: E11.40   cefUROXime 250 MG tablet Commonly known as:  CEFTIN Take 1 tablet (250 mg total) by mouth 2 (two) times daily for 3 days.   D3-50 PO Take 3 tablets by mouth daily.   EYLEA 2 MG/0.05ML Soln Generic drug:  Aflibercept 1 each by Intravitreal route as directed.   furosemide 20 MG tablet Commonly known as:   LASIX Take 20 mg by mouth daily.   glucose blood test strip USE 1 STRIP TO CHECK GLUCOSE THREE TIMES DAILY   HAIR/SKIN/NAILS PO Take 1 tablet by mouth daily.   meclizine 25 MG tablet Commonly known as:  ANTIVERT Take 1 tablet (25 mg total) by mouth 3 (three) times daily as needed for dizziness.   metFORMIN 500 MG 24 hr tablet Commonly known as:  GLUCOPHAGE-XR TAKE 2 TABLETS BY MOUTH TWICE DAILY   metoprolol tartrate 50 MG tablet Commonly known as:  LOPRESSOR Take 25 mg by mouth 2 (two) times daily.   PRESERVISION AREDS 2 PO Take 2 tablets by mouth daily.   SYSTANE OP Apply 1 drop to eye 2 (two) times daily.   tamoxifen 20 MG tablet Commonly known as:  NOLVADEX Take 1 tablet (20 mg total) by mouth daily.        DISCHARGE INSTRUCTIONS:  See AVS. If you experience worsening of your admission symptoms, develop shortness of breath, life threatening emergency, suicidal or homicidal thoughts you must seek medical attention immediately by calling 911 or calling your MD immediately  if symptoms less severe.  You Must read complete instructions/literature along with all the possible adverse reactions/side effects for all the Medicines you take and that have been prescribed to you. Take any new Medicines after you have completely understood and accpet all the possible adverse reactions/side effects.   Please note  You were cared for by a hospitalist during your hospital stay. If you have any questions about your discharge medications or the care you received while you were in the hospital after you are discharged, you can call the unit and asked to speak with the hospitalist on call if the hospitalist that took care of you is not available. Once you are discharged, your primary care physician will handle any further medical issues. Please note that NO REFILLS for any discharge medications will be authorized once you are discharged, as it is imperative that you return to your primary  care physician (or establish a relationship with a primary care physician if you do not have one) for your aftercare needs so that they can reassess your need for medications and monitor your lab values.    On the day of Discharge:  VITAL SIGNS:  Blood pressure (!) 153/74, pulse 72, temperature 98 F (36.7 C), temperature source Oral, resp. rate 20, height 5' 2.5" (1.588 m), weight 71.2 kg, SpO2 98 %. PHYSICAL EXAMINATION:  GENERAL:  84 y.o.-year-old patient lying in the bed with no acute distress.  EYES: Pupils equal, round, reactive to light and accommodation. No scleral icterus. Extraocular muscles intact.  HEENT: Head atraumatic, normocephalic. Oropharynx and nasopharynx clear.  NECK:  Supple, no jugular venous distention. No thyroid enlargement, no tenderness.  LUNGS: Normal breath sounds bilaterally, no wheezing, rales,rhonchi or crepitation. No use of accessory muscles of respiration.  CARDIOVASCULAR: S1, S2 normal. No murmurs, rubs, or gallops.  ABDOMEN: Soft, non-tender, non-distended. Bowel sounds present. No organomegaly or mass.  EXTREMITIES:  No pedal edema, cyanosis, or clubbing.  NEUROLOGIC: Cranial nerves II through XII are intact. Muscle strength 5/5 in all extremities. Sensation intact. Gait not checked.  PSYCHIATRIC: The patient is alert and oriented x 3.  SKIN: No obvious rash, lesion, or ulcer.  DATA REVIEW:   CBC Recent Labs  Lab 08/16/18 0429  WBC 5.0  HGB 11.8*  HCT 37.0  PLT 141*    Chemistries  Recent Labs  Lab 08/16/18 0429  NA 141  K 3.8  CL 109  CO2 26  GLUCOSE 101*  BUN 8  CREATININE 0.50  CALCIUM 9.2     Microbiology Results  Results for orders placed or performed during the hospital encounter of 08/15/18  Urine Culture     Status: Abnormal (Preliminary result)   Collection Time: 08/15/18  5:57 PM  Result Value Ref Range Status   Specimen Description   Final    URINE, RANDOM Performed at [facility], [address]  [place], [place] [address]    Special Requests   Final    NONE Performed at [facility], [address], [place], [place] [address]    Culture (A)  Final    >=100,000 COLONIES/mL ENTEROBACTER AEROGENES CULTURE REINCUBATED FOR BETTER GROWTH Performed at [facility], [place] [address], [place], [place] [address]    Report Status PENDING  Incomplete    RADIOLOGY:  No results found.   Management plans discussed with the patient, family and they are in agreement.  CODE STATUS: Full Code   TOTAL TIME TAKING CARE OF THIS PATIENT: 33 minutes.    Demetrios Loll M.D on 08/17/2018 at 4:18 PM  Between 7am to 6pm - Pager - [phone]  After 6pm go to [email or web address] - Proofreader  [facility]  Office  [phone]  CC: Primary care physician; [name], MD   Note: This dictation was prepared with Dragon dictation along with smaller phrase technology. Any transcriptional errors that result from this process are unintentional.

## 2018-08-17 NOTE — Care Management (Signed)
Spoke with Ms. Record and family members at the bedside. Discussed Home Health agencies. [place]. Will update Floydene Flock, [facility] representative. Discharge to home today per Dr. Haydee Monica RN MSN CCM Care Management [phone]

## 2018-08-17 NOTE — Plan of Care (Signed)
  Problem: Education: Goal: Knowledge of General Education information will improve Description Including pain rating scale, medication(s)/side effects and non-pharmacologic comfort measures Outcome: Progressing   Problem: Health Behavior/Discharge Planning: Goal: Ability to manage health-related needs will improve Outcome: Progressing   Problem: Clinical Measurements: Goal: Ability to maintain clinical measurements within normal limits will improve Outcome: Progressing Goal: Will remain free from infection Outcome: Progressing Goal: Diagnostic test results will improve Outcome: Progressing Goal: Respiratory complications will improve Outcome: Progressing Goal: Cardiovascular complication will be avoided Outcome: Progressing   Problem: Elimination: Goal: Will not experience complications related to bowel motility Outcome: Progressing Goal: Will not experience complications related to urinary retention Outcome: Progressing   Problem: Safety: Goal: Ability to remain free from injury will improve Outcome: Progressing   Problem: Skin Integrity: Goal: Risk for impaired skin integrity will decrease Outcome: Progressing   Problem: Urinary Elimination: Goal: Signs and symptoms of infection will decrease Outcome: Progressing

## 2018-08-17 NOTE — Telephone Encounter (Signed)
Please call daughter-n-law [name]).  I am ok to see her tomorrow if they feel she needs to be seen.  Just let me know.

## 2018-08-17 NOTE — Discharge Instructions (Signed)
HHPT °Fall precaution. °

## 2018-08-18 ENCOUNTER — Ambulatory Visit: Payer: BLUE CROSS/BLUE SHIELD | Admitting: Internal Medicine

## 2018-08-18 ENCOUNTER — Telehealth: Payer: Self-pay | Admitting: *Deleted

## 2018-08-18 NOTE — Telephone Encounter (Signed)
Transition Care Management Follow-up Telephone Call  How have you been since you were released from the hospital? Patient feeling much better not as dizzy per daughter , just weak.   Do you understand why you were in the hospital? yes   Do you understand the discharge instrcutions? yes  Items Reviewed:  Medications reviewed: yes  Allergies reviewed: yes  Dietary changes reviewed: yes  Referrals reviewed: yes   Functional Questionnaire:   Activities of Daily Living (ADLs):   She states they are independent in the following: bathing and hygiene, feeding, continence, grooming, toileting and dressing States they require assistance with the following: ambulation   Any transportation issues/concerns?: no   Any patient concerns? no   Confirmed importance and date/time of follow-up visits scheduled: yes   Confirmed with patient if condition begins to worsen call PCP or go to the ER.  Patient was given the Call-a-Nurse line [phone]: yes

## 2018-08-18 NOTE — Telephone Encounter (Signed)
Transition Care Management Follow-up Telephone Call  How have you been since you were released from the hospital? Patient feeling much better not as dizzy per daughter , just weak.   Do you understand why you were in the hospital? yes   Do you understand the discharge instrcutions? yes  Items Reviewed:  Medications reviewed: yes  Allergies reviewed: yes  Dietary changes reviewed: yes  Referrals reviewed: yes   Functional Questionnaire:   Activities of Daily Living (ADLs):   She states they are independent in the following: bathing and hygiene, feeding, continence, grooming, toileting and dressing States they require assistance with the following: ambulation   Any transportation issues/concerns?: no   Any patient concerns? no   Confirmed importance and date/time of follow-up visits scheduled: yes   Confirmed with patient if condition begins to worsen call PCP or go to the ER.  Patient was given the Call-a-Nurse line [phone]: yes

## 2018-08-19 LAB — URINE CULTURE: Culture: >=100000 — AB

## 2018-08-22 DIAGNOSIS — H353221 Exudative age-related macular degeneration, left eye, with active choroidal neovascularization: Secondary | ICD-10-CM | POA: Diagnosis not present

## 2018-08-23 ENCOUNTER — Ambulatory Visit: Payer: Medicare Other | Admitting: *Deleted

## 2018-08-23 ENCOUNTER — Other Ambulatory Visit: Payer: Self-pay | Admitting: *Deleted

## 2018-08-23 DIAGNOSIS — H353211 Exudative age-related macular degeneration, right eye, with active choroidal neovascularization: Secondary | ICD-10-CM | POA: Diagnosis not present

## 2018-08-23 NOTE — Patient Outreach (Signed)
  [facility]) Care Management  08/23/2018  Aunica A Salsbury September 23, 1929 [id number]   EMMI-general discharge, RED ON EMMI ALERT Day # 1 Date: Friday 08/19/18 [place] Reason:Read discharge papers? No Know who to call about changes in condition? No   Insurance: Medicare and blue cross  shield supplement [facility] admissions  ED visits in the last 6 months    Outreach attempt # 1  [facility] RN CM called and a female person answered  CM informed him of the purpose of CM's call  The female person stated "we are not going to do that now. Don't call here anymore. We will call you.  Plan: [facility] RN CM sent an unsuccessful outreach letter and scheduled this patient for another call attempt within 4 business days  If there is the same response [facility] RN CM will the case at that time   [place] L. Lavina Hamman, RN, BSN, [place] Coordinator Office number [phone] Mobile number [phone]  Main [facility] number [phone] Fax number [phone]

## 2018-08-24 ENCOUNTER — Other Ambulatory Visit: Payer: Self-pay | Admitting: *Deleted

## 2018-08-24 ENCOUNTER — Ambulatory Visit (INDEPENDENT_AMBULATORY_CARE_PROVIDER_SITE_OTHER): Payer: Medicare Other | Admitting: Internal Medicine

## 2018-08-24 DIAGNOSIS — E1140 Type 2 diabetes mellitus with diabetic neuropathy, unspecified: Secondary | ICD-10-CM

## 2018-08-24 DIAGNOSIS — N3000 Acute cystitis without hematuria: Secondary | ICD-10-CM | POA: Diagnosis not present

## 2018-08-24 DIAGNOSIS — R42 Dizziness and giddiness: Secondary | ICD-10-CM

## 2018-08-24 DIAGNOSIS — I2510 Atherosclerotic heart disease of native coronary artery without angina pectoris: Secondary | ICD-10-CM | POA: Diagnosis not present

## 2018-08-24 DIAGNOSIS — I482 Chronic atrial fibrillation, unspecified: Secondary | ICD-10-CM

## 2018-08-24 DIAGNOSIS — E7800 Pure hypercholesterolemia, unspecified: Secondary | ICD-10-CM

## 2018-08-24 NOTE — Progress Notes (Signed)
Patient ID: Holly Rose, female   DOB: 06/27/1930, 84 y.o.   MRN: [id number]   Subjective:    Patient ID: Holly Rose, female    DOB: 07/28/1930, 84 y.o.   MRN: [id number]  HPI  Patient here for a hospital follow up.  She is accompanied by her daughter-n-law.  History obtained from both of them.  She was recently admitted with dizziness - suspected to be secondary to vertigo.  CT head was negative for acute stroke. CTA head/neck - some intracranial stenosis as outlined.  Recommended continuing meclizine.  Had recommended home health PT.  Discussed with her today.  She declined.  Was also diagnosed with uti.  Treated with ceftriaxone and discharged on ceftin.  States she does feel better.  Dizziness is better, but still has episodes of head feeling funny.  No headache.  No sinus congestion.  No chest pain.  No sob.  No acid reflux.  No abdominal pain.  Bowels moving.  Blood sugars doing well.     Past Medical History:  Diagnosis Date  . Allergy   . Anemia   . Arthritis   . Atrial fibrillation (HCC) 2004  . Breast cancer, left (HCC) 10/2017   Lumpectomy and rad tx's.   . CAD (coronary artery disease)   . Complication of anesthesia    hard to wake up with general anesthesia  . Diabetes (HCC)   . Dysrhythmia    A-fib  . Heart attack (HCC) 2008  . Heart murmur   . History of kidney stones   . History of sick sinus syndrome    s/p pacemaker placement  . Hyperlipidemia   . Macular degeneration   . Neuropathy   . Neuropathy   . Pacemaker 06/2010  . Personal history of radiation therapy 11/2017   LEFT lumpectomy   . Squamous cell skin cancer   . TIA (transient ischemic attack)    Past Surgical History:  Procedure Laterality Date  . ABDOMINAL HYSTERECTOMY  1960's  . APPENDECTOMY  1947  . BLADDER SURGERY     1975 and 1995  . BREAST BIOPSY Left 12/17/2016   SINGLE FRAGMENT OF ATYPICAL EPITHELIAL CELLS  . BREAST BIOPSY Left 04/08/2017  . BREAST EXCISIONAL BIOPSY Left  01/18/1986   Benign microcalcifications, [employer].  . BREAST LUMPECTOMY Left 05/26/2017   13 mm,T1c, N0; ER/ PR+, Her 2 neu negative, HIGH RISK by [employer] ;  Surgeon: Byrnett, Jeffrey W, MD;  Location: [facility] ORS;  Service: General;  Laterality: Left;  . BREAST SURGERY  1988  . CARDIAC CATHETERIZATION  1989  . CATARACT EXTRACTION  1997 and 1998  . COLONOSCOPY WITH PROPOFOL N/A 09/12/2015   Procedure: COLONOSCOPY WITH PROPOFOL;  Surgeon: Robert T Elliott, MD;  Location: [facility] ENDOSCOPY;  Service: Endoscopy;  Laterality: N/A;  . KIDNEY STONE SURGERY  2018  . MOHS SURGERY  2019   forehead  . pace maker  2008  . RECTOCELE REPAIR  1996  . TONSILLECTOMY AND ADENOIDECTOMY  1950"s   Family History  Problem Relation Age of Onset  . Stroke Mother   . Diabetes Mother   . Cancer Maternal Aunt        Breast Cancer  . Breast cancer Maternal Aunt   . Arthritis Maternal Grandmother   . Cancer Maternal Aunt        Lung Cancer  . Diabetes Son   . Cancer Son   . Kidney disease Neg Hx   . Bladder Cancer Neg Hx      Social History   Socioeconomic History  . Marital status: Married    Spouse name: Tom  . Number of children: 2  . Years of education: Not on file  . Highest education level: Not on file  Occupational History  . Not on file  Social Needs  . Financial resource strain: Not hard at all  . Food insecurity:    Worry: Never true    Inability: Never true  . Transportation needs:    Medical: No    Non-medical: No  Tobacco Use  . Smoking status: Former Smoker    Years: 5.00    Last attempt to quit: 09/14/1968    Years since quitting: 49.9  . Smokeless tobacco: Never Used  . Tobacco comment: quit 1970  Substance and Sexual Activity  . Alcohol use: No    Alcohol/week: 0.0 standard drinks  . Drug use: No  . Sexual activity: Not on file  Lifestyle  . Physical activity:    Days per week: 2 days    Minutes per session: 20 min  . Stress: Not at all  Relationships  . Social  connections:    Talks on phone: Three times a week    Gets together: Twice a week    Attends religious service: More than 4 times per year    Active member of club or organization: No    Attends meetings of clubs or organizations: Never    Relationship status: Married  Other Topics Concern  . Not on file  Social History Narrative   Live home with husband in [place]    Outpatient Encounter Medications as of 08/24/2018  Medication Sig  . Aflibercept (EYLEA) 2 MG/0.05ML SOLN 1 each by Intravitreal route as directed.   . aspirin 81 MG tablet Take 81 mg by mouth daily.  . Biotin w/ Vitamins C & E (HAIR/SKIN/NAILS PO) Take 1 tablet by mouth daily.  . blood glucose meter kit and supplies Per patient please dispense One touch Ultra2 meter.Use meter and supplies three times a day to check blood sugar. ICD10: E11.40  . Cholecalciferol (D3-50 PO) Take 3 tablets by mouth daily.   . furosemide (LASIX) 20 MG tablet Take 20 mg by mouth daily.   . glucose blood (ONE TOUCH ULTRA TEST) test strip USE 1 STRIP TO CHECK GLUCOSE THREE TIMES DAILY  . meclizine (ANTIVERT) 25 MG tablet Take 1 tablet (25 mg total) by mouth 3 (three) times daily as needed for dizziness.  . metFORMIN (GLUCOPHAGE-XR) 500 MG 24 hr tablet TAKE 2 TABLETS BY MOUTH TWICE DAILY  . metoprolol (LOPRESSOR) 50 MG tablet Take 25 mg by mouth 2 (two) times daily.   . Multiple Vitamins-Minerals (PRESERVISION AREDS 2 PO) Take 2 tablets by mouth daily.   . Polyethyl Glycol-Propyl Glycol (SYSTANE OP) Apply 1 drop to eye 2 (two) times daily.  . tamoxifen (NOLVADEX) 20 MG tablet Take 1 tablet (20 mg total) by mouth daily.   No facility-administered encounter medications on file as of 08/24/2018.     Review of Systems  Constitutional: Negative for appetite change and unexpected weight change.  HENT: Negative for congestion and sinus pressure.   Respiratory: Negative for cough, chest tightness and shortness of breath.     Cardiovascular: Negative for chest pain, palpitations and leg swelling.  Gastrointestinal: Negative for abdominal pain, diarrhea, nausea and vomiting.  Genitourinary: Negative for difficulty urinating and dysuria.  Musculoskeletal: Negative for joint swelling and myalgias.  Skin: Negative for color change and rash.    Neurological: Positive for dizziness and light-headedness. Negative for headaches.  Psychiatric/Behavioral: Negative for agitation and decreased concentration.       Objective:    Physical Exam Constitutional:      General: She is not in acute distress.    Appearance: Normal appearance.  HENT:     Nose: Congestion present.  Neck:     Musculoskeletal: Neck supple. No muscular tenderness.     Thyroid: No thyromegaly.  Cardiovascular:     Rate and Rhythm: Normal rate and regular rhythm.  Pulmonary:     Effort: No respiratory distress.     Breath sounds: Normal breath sounds. No wheezing.  Abdominal:     General: Bowel sounds are normal.     Palpations: Abdomen is soft.     Tenderness: There is no abdominal tenderness.  Musculoskeletal:        General: No swelling or tenderness.  Lymphadenopathy:     Cervical: No cervical adenopathy.  Neurological:     Mental Status: She is alert.  Psychiatric:        Mood and Affect: Mood normal.        Thought Content: Thought content normal.     BP 136/72 (BP Location: Left Arm, Patient Position: Sitting, Cuff Size: Normal)   Pulse (!) 50   Temp 97.7 F (36.5 C) (Oral)   Resp 16   Wt 154 lb 3.2 oz (69.9 kg)   LMP  (LMP Unknown)   SpO2 97%   BMI 27.75 kg/m  Wt Readings from Last 3 Encounters:  08/24/18 154 lb 3.2 oz (69.9 kg)  08/15/18 157 lb (71.2 kg)  07/21/18 156 lb 11.2 oz (71.1 kg)     Lab Results  Component Value Date   WBC 5.0 08/16/2018   HGB 11.8 (L) 08/16/2018   HCT 37.0 08/16/2018   PLT 141 (L) 08/16/2018   GLUCOSE 101 (H) 08/16/2018   CHOL 182 04/18/2018   TRIG 89 04/18/2018   HDL 63  04/18/2018   LDLCALC 101 (H) 04/18/2018   ALT 18 04/18/2018   AST 24 04/18/2018   NA 141 08/16/2018   K 3.8 08/16/2018   CL 109 08/16/2018   CREATININE 0.50 08/16/2018   BUN 8 08/16/2018   CO2 26 08/16/2018   TSH 4.890 (H) 04/18/2018   HGBA1C 6.8 (H) 04/18/2018   MICROALBUR 65.4 (H) 03/09/2016    Ct Angio Head W Or Wo Contrast  Result Date: 08/15/2018 CLINICAL DATA:  84 y/o  F; Vertigo, persistent, central. EXAM: CT ANGIOGRAPHY HEAD AND NECK TECHNIQUE: Multidetector CT imaging of the head and neck was performed using the standard protocol during bolus administration of intravenous contrast. Multiplanar CT image reconstructions and MIPs were obtained to evaluate the vascular anatomy. Carotid stenosis measurements (when applicable) are obtained utilizing NASCET criteria, using the distal internal carotid diameter as the denominator. CONTRAST:  75mL OMNIPAQUE IOHEXOL 350 MG/ML SOLN COMPARISON:  08/15/2018 CT head. FINDINGS: CTA NECK FINDINGS Aortic arch: Bovine variant branching. Imaged portion shows no evidence of aneurysm or dissection. No significant stenosis of the major arch vessel origins. Aortic calcific atherosclerosis. Right carotid system: No evidence of dissection, stenosis (50% or greater) or occlusion. Calcific plaque of the carotid siphon without significant stenosis. Left carotid system: No evidence of dissection, stenosis (50% or greater) or occlusion. Calcific plaque of the carotid siphons with mild less than 50% proximal ICA stenosis. Vertebral arteries: Left dominant. No evidence of dissection, stenosis (50% or greater) or occlusion. Skeleton: Moderate spondylosis of the cervical   spine greatest at the C4-C7 levels. No high-grade bony canal stenosis. Other neck: Negative. Upper chest: Negative. Review of the MIP images confirms the above findings CTA HEAD FINDINGS Anterior circulation: No significant stenosis, proximal occlusion, aneurysm, or vascular malformation. Calcific  atherosclerosis of carotid siphons with mild less than 50% bilateral paraclinoid stenosis. Posterior circulation: Severe right and moderate left P1 stenosis. Additional segments of moderate to severe stenosis are present in the distal bilateral PCA circulation. Patent basilar and vertebral arteries without significant stenosis. No dissection, large vessel occlusion, aneurysm. No vascular malformation. Venous sinuses: As permitted by contrast timing, patent. Anatomic variants: None significant. Delayed phase: No abnormal intracranial enhancement. Review of the MIP images confirms the above findings IMPRESSION: 1. Patent carotid and vertebral arteries. No dissection, aneurysm, or hemodynamically significant stenosis utilizing NASCET criteria. 2. Patent anterior and posterior intracranial circulation. No large vessel occlusion, aneurysm, or vascular malformation. Calcific atherosclerosis of the aorta and carotid systems. Mild bilateral paraclinoid stenosis. 3. Severe right and moderate left P1 stenosis. Multiple segments of moderate to severe stenosis in bilateral PCA circulation. Electronically Signed   By: Kristine Garbe M.D.   On: 08/15/2018 19:27   Ct Head Wo Contrast  Result Date: 08/15/2018 CLINICAL DATA:  Dizziness starting yesterday, worsened today, fall. EXAM: CT HEAD WITHOUT CONTRAST TECHNIQUE: Contiguous axial images were obtained from the base of the skull through the vertex without intravenous contrast. COMPARISON:  Head CT dated 07/07/2011. FINDINGS: Brain: Mild generalized age related volume loss with commensurate dilatation of the ventricles and sulci. 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 Mild chronic small vessel ischemic changes within the deep periventricular white matter regions bilaterally. There is no mass, hemorrhage, edema or other evidence of acute parenchymal abnormality. No extra-axial hemorrhage. Vascular: Chronic calcified  atherosclerotic changes of the large vessels at the skull base. No unexpected hyperdense vessel. Skull: Normal. Negative for fracture or focal lesion. Sinuses/Orbits: No acute finding. Other: None. IMPRESSION: 1. No acute findings. No intracranial mass, hemorrhage or edema. 2. Chronic ischemic changes, as detailed above. Electronically Signed   By: Franki Cabot M.D.   On: 08/15/2018 16:38   Ct Angio Neck W And/or Wo Contrast  Result Date: 08/15/2018 CLINICAL DATA:  84 y/o  F; Vertigo, persistent, central. EXAM: CT ANGIOGRAPHY HEAD AND NECK TECHNIQUE: Multidetector CT imaging of the head and neck was performed using the standard protocol during bolus administration of intravenous contrast. Multiplanar CT image reconstructions and MIPs were obtained to evaluate the vascular anatomy. Carotid stenosis measurements (when applicable) are obtained utilizing NASCET criteria, using the distal internal carotid diameter as the denominator. CONTRAST:  62m OMNIPAQUE IOHEXOL 350 MG/ML SOLN COMPARISON:  08/15/2018 CT head. FINDINGS: CTA NECK FINDINGS Aortic arch: Bovine variant branching. Imaged portion shows no evidence of aneurysm or dissection. No significant stenosis of the major arch vessel origins. Aortic calcific atherosclerosis. Right carotid system: No evidence of dissection, stenosis (50% or greater) or occlusion. Calcific plaque of the carotid siphon without significant stenosis. Left carotid system: No evidence of dissection, stenosis (50% or greater) or occlusion. Calcific plaque of the carotid siphons with mild less than 50% proximal ICA stenosis. Vertebral arteries: Left dominant. No evidence of dissection, stenosis (50% or greater) or occlusion. Skeleton: Moderate spondylosis of the cervical spine greatest at the C4-C7 levels. No high-grade bony canal stenosis. Other neck: Negative. Upper chest: Negative. Review of the MIP images confirms the above findings CTA HEAD FINDINGS Anterior circulation: No  significant stenosis, proximal occlusion, aneurysm, or  vascular malformation. Calcific atherosclerosis of carotid siphons with mild less than 50% bilateral paraclinoid stenosis. Posterior circulation: Severe right and moderate left P1 stenosis. Additional segments of moderate to severe stenosis are present in the distal bilateral PCA circulation. Patent basilar and vertebral arteries without significant stenosis. No dissection, large vessel occlusion, aneurysm. No vascular malformation. Venous sinuses: As permitted by contrast timing, patent. Anatomic variants: None significant. Delayed phase: No abnormal intracranial enhancement. Review of the MIP images confirms the above findings IMPRESSION: 1. Patent carotid and vertebral arteries. No dissection, aneurysm, or hemodynamically significant stenosis utilizing NASCET criteria. 2. Patent anterior and posterior intracranial circulation. No large vessel occlusion, aneurysm, or vascular malformation. Calcific atherosclerosis of the aorta and carotid systems. Mild bilateral paraclinoid stenosis. 3. Severe right and moderate left P1 stenosis. Multiple segments of moderate to severe stenosis in bilateral PCA circulation. Electronically Signed   By: Lance  Furusawa-Stratton M.D.   On: 08/15/2018 19:27       Assessment & Plan:   Problem List Items Addressed This Visit    Atrial fibrillation, chronic    Has pacemaker in place.  Followed by Dr Fath.  Stable.        CAD (coronary artery disease)    Stable.  Followed by cardiology.       Diabetes mellitus with neuropathy (HCC)    Sugars doing well.  Has been followed by endocrinology.  Follow met b and a1c.        Dizziness    Dizziness as outlined.  Discussed referral to ENT for evaluation and question of need for vestibular rehab, etc.  Also discussed vascular surgery evaluation for intracranial stenosis.        Hypercholesterolemia    Follow lipid panel.       UTI (urinary tract infection)     Completed abx.  No urinary symptoms currently.  Follow.             , MD Outpatient Encounter Medications as of 08/24/2018  Medication Sig  . Aflibercept (EYLEA) 2 MG/0.05ML SOLN 1 each by Intravitreal route as directed.   . aspirin 81 MG tablet Take 81 mg by mouth daily.  . Biotin w/ Vitamins C & E (HAIR/SKIN/NAILS PO) Take 1 tablet by mouth daily.  . blood glucose meter kit and supplies Per patient please dispense One touch Ultra2 meter.Use meter and supplies three times a day to check blood sugar. ICD10: E11.40  . Cholecalciferol (D3-50 PO) Take 3 tablets by mouth daily.   . furosemide (LASIX) 20 MG tablet Take 20 mg by mouth daily.   . glucose blood (ONE TOUCH ULTRA TEST) test strip USE 1 STRIP TO CHECK GLUCOSE THREE TIMES DAILY  . meclizine (ANTIVERT) 25 MG tablet Take 1 tablet (25 mg total) by mouth 3 (three) times daily as needed for dizziness.  . metFORMIN (GLUCOPHAGE-XR) 500 MG 24 hr tablet TAKE 2 TABLETS BY MOUTH TWICE DAILY  . metoprolol (LOPRESSOR) 50 MG tablet Take 25 mg by mouth 2 (two) times daily.   . Multiple Vitamins-Minerals (PRESERVISION AREDS 2 PO) Take 2 tablets by mouth daily.   . Polyethyl Glycol-Propyl Glycol (SYSTANE OP) Apply 1 drop to eye 2 (two) times daily.  . tamoxifen (NOLVADEX) 20 MG tablet Take 1 tablet (20 mg total) by mouth daily.   No facility-administered encounter medications on file as of 08/24/2018.     

## 2018-08-24 NOTE — Patient Outreach (Signed)
[facility]) Care Management  08/24/2018  Jodel A Lutes [birth year] [id number]   EMMI-general discharge, RED ON EMMI ALERT Day # 1 Date: Friday 08/19/18 [place] Reason:Read discharge papers? No Know who to call about changes in condition? No   Insurance: Medicare and blue cross  shield supplement [facility] admissions  ED visits in the last 6 months    Outreach attempt # 2  [facility] RN CM called to attempt again to speak with the patient or a representing family member  A female who still refused to identify himself answers CM introduced herself, discussed the purpose of the call, discussed calling on 08/23/18 and explained that if the patient wanted to discontinue the EMMI contact calls, this CM could assist with the process. When Cm inquired if Cm was speaking with Mr Rasor he states "yes" and then "We don't want it"  The call was disconnected by the female   Transition of care services noted by CM  to be completed by primary care MD office staff with note in [place] on 08/18/18    Plan: [facility] RN CM received a similar response from a female in the home, possibly the husband on 08/24/18  [facility] RN CM will have [facility] CMA to discontinue the EMMI contact calls for this patient [facility] RN CM will close this case at this time as CM wasl not allowed to speak with patient and refuses to participate in the program A closed letter was sent to Dr [name] and the patient  Joelene Millin L. Lavina Hamman, RN, BSN, [place] Coordinator Office number [phone] Mobile number [phone]  Main [facility] number [phone] Fax number [phone]

## 2018-08-25 ENCOUNTER — Telehealth: Payer: Self-pay

## 2018-08-25 NOTE — Telephone Encounter (Signed)
Copied from [place] [id number]. Topic: Quick Communication - See Telephone Encounter >> Aug 25, 2018  2:43 PM Berneta Levins wrote: CRM for notification. See Telephone encounter for: 08/25/18.  Melinda from [facility] calling.  States that pt has declined home health services so they will the closing chart. Rip Harbour can be reached at [phone]

## 2018-08-25 NOTE — Telephone Encounter (Signed)
FYI patient does not want home health

## 2018-08-29 ENCOUNTER — Encounter: Payer: Self-pay | Admitting: Internal Medicine

## 2018-08-29 DIAGNOSIS — R42 Dizziness and giddiness: Secondary | ICD-10-CM | POA: Insufficient documentation

## 2018-08-29 NOTE — Assessment & Plan Note (Signed)
Stable.  Followed by cardiology.   

## 2018-08-29 NOTE — Assessment & Plan Note (Signed)
Dizziness as outlined.  Discussed referral to ENT for evaluation and question of need for vestibular rehab, etc.  Also discussed vascular surgery evaluation for intracranial stenosis.

## 2018-08-29 NOTE — Assessment & Plan Note (Signed)
Completed abx.  No urinary symptoms currently.  Follow.

## 2018-08-29 NOTE — Assessment & Plan Note (Signed)
Follow lipid panel.   

## 2018-08-29 NOTE — Assessment & Plan Note (Signed)
Sugars doing well.  Has been followed by endocrinology.  Follow met b and a1c.

## 2018-08-29 NOTE — Assessment & Plan Note (Signed)
Has pacemaker in place.  Followed by Dr Ubaldo Glassing.  Stable.

## 2018-08-31 ENCOUNTER — Encounter: Payer: Self-pay | Admitting: Podiatry

## 2018-08-31 ENCOUNTER — Ambulatory Visit (INDEPENDENT_AMBULATORY_CARE_PROVIDER_SITE_OTHER): Payer: Medicare Other | Admitting: Podiatry

## 2018-08-31 DIAGNOSIS — L97521 Non-pressure chronic ulcer of other part of left foot limited to breakdown of skin: Secondary | ICD-10-CM

## 2018-08-31 DIAGNOSIS — E1142 Type 2 diabetes mellitus with diabetic polyneuropathy: Secondary | ICD-10-CM

## 2018-08-31 NOTE — Progress Notes (Signed)
She presents today for follow-up of the distal aspect of her second toe where she has an ulceration.  She states that it really does not do not think that is healing because of it seems to be bleeding a lot.  She states that she has not been wearing her sulcus pad because it ends up in the tip of her tissue.  She denies fever chills nausea vomiting muscle aches pains.  Objective: Vital signs are stable she is alert and oriented x3 distal aspect of the second toe demonstrates hammertoe deformity with the distal clavus and ulceration with mild erythema no cellulitis drainage or odor reactive hyperkeratotic tissue once debrided demonstrates normal hydrated tissue.  No purulence no malodor.  Assessment: Hammertoe deformity with ulceration distal aspect of the toe.  Plan: Debrided reactive hyperkeratosis today follow-up with her and about 3 to 4 months at which time we will discuss distal amputation of the toe due to chronicity.

## 2018-09-05 ENCOUNTER — Other Ambulatory Visit: Payer: Self-pay | Admitting: Oncology

## 2018-09-20 ENCOUNTER — Other Ambulatory Visit: Payer: Self-pay | Admitting: Internal Medicine

## 2018-09-28 ENCOUNTER — Ambulatory Visit (INDEPENDENT_AMBULATORY_CARE_PROVIDER_SITE_OTHER): Payer: Medicare Other | Admitting: Podiatry

## 2018-09-28 ENCOUNTER — Encounter: Payer: Self-pay | Admitting: Podiatry

## 2018-09-28 DIAGNOSIS — L97521 Non-pressure chronic ulcer of other part of left foot limited to breakdown of skin: Secondary | ICD-10-CM | POA: Diagnosis not present

## 2018-09-28 NOTE — Progress Notes (Signed)
She presents today for follow-up of an ulcer to the second toe of the left foot.  She states that is the same but seems to be bleeding a little more recently.  She presents with her husband and daughter.  Objective: Vital signs are stable alert and oriented x3.  Pulses are palpable.  At this point hammertoe deformity second left is resulted in a distal clavus and ulceration that has not healed.  There is no purulence there is some bloody serosanguineous type drainage mild erythema to the level of the DIPJ and mildly tender on palpation.  Assessment: I feel the most likely this chronic area of inflammation and on again off again infection very well may demonstrate some osteomyelitis this not visible on radiograph.  Plan: We consented her today for partial amputation of this toe as well as tenotomies of the hallux and fourth toe left foot.  I am going to get permission from her cardiologist to make sure that she is okay to undergo IV sedation and local anesthetic.  We need to make sure that she understands the possible side effects of this.  She consented for this today signed a 3 page of the consent form and dispensed a Darco shoe.

## 2018-09-28 NOTE — Patient Instructions (Signed)
Pre-Operative Instructions  Congratulations, you have decided to take an important step towards improving your quality of life.  You can be assured that the doctors and staff at [facility] will be with you every step of the way.  Here are some important things you should know:  1. Plan to be at the surgery center/hospital at least 1 (one) hour prior to your scheduled time, unless otherwise directed by the surgical center/hospital staff.  You must have a responsible adult accompany you, remain during the surgery and drive you home.  Make sure you have directions to the surgical center/hospital to ensure you arrive on time. 2. If you are having surgery at [facility] or [facility], you will need a copy of your medical history and physical form from your family physician within one month prior to the date of surgery. We will give you a form for your primary physician to complete.  3. We make every effort to accommodate the date you request for surgery.  However, there are times where surgery dates or times have to be moved.  We will contact you as soon as possible if a change in schedule is required.   4. No aspirin/ibuprofen for one week before surgery.  If you are on aspirin, any non-steroidal anti-inflammatory medications (Mobic, Aleve, Ibuprofen) should not be taken seven (7) days prior to your surgery.  You make take Tylenol for pain prior to surgery.  5. Medications - If you are taking daily heart and blood pressure medications, seizure, reflux, allergy, asthma, anxiety, pain or diabetes medications, make sure you notify the surgery center/hospital before the day of surgery so they can tell you which medications you should take or avoid the day of surgery. 6. No food or drink after midnight the night before surgery unless directed otherwise by surgical center/hospital staff. 7. No alcoholic beverages 24-hours prior to surgery.  No smoking 24-hours prior or 24-hours after  surgery. 8. Wear loose pants or shorts. They should be loose enough to fit over bandages, boots, and casts. 9. Don't wear slip-on shoes. Sneakers are preferred. 10. Bring your boot with you to the surgery center/hospital.  Also bring crutches or a walker if your physician has prescribed it for you.  If you do not have this equipment, it will be provided for you after surgery. 11. If you have not been contacted by the surgery center/hospital by the day before your surgery, call to confirm the date and time of your surgery. 12. Leave-time from work may vary depending on the type of surgery you have.  Appropriate arrangements should be made prior to surgery with your employer. 13. Prescriptions will be provided immediately following surgery by your doctor.  Fill these as soon as possible after surgery and take the medication as directed. Pain medications will not be refilled on weekends and must be approved by the doctor. 14. Remove nail polish on the operative foot and avoid getting pedicures prior to surgery. 15. Wash the night before surgery.  The night before surgery wash the foot and leg well with water and the antibacterial soap provided. Be sure to pay special attention to beneath the toenails and in between the toes.  Wash for at least three (3) minutes. Rinse thoroughly with water and dry well with a towel.  Perform this wash unless told not to do so by your physician.  Enclosed: 1 Ice pack (please put in freezer the night before surgery)   1 Hibiclens skin cleaner     Pre-op instructions  If you have any questions regarding the instructions, please do not hesitate to call our office.  [place]: [address], [place], [place] [address] -- [phone]  [place]: [address] [place], [place] [address] -- [phone]  [place]: [address]  [place], [place] [address] -- [phone]  [place]: [address], [address], [place], [place] [address] -- [phone]  Website:  [email or web address] 

## 2018-09-29 ENCOUNTER — Encounter: Payer: Self-pay | Admitting: *Deleted

## 2018-09-29 DIAGNOSIS — I495 Sick sinus syndrome: Secondary | ICD-10-CM | POA: Diagnosis not present

## 2018-09-29 NOTE — Progress Notes (Signed)
Per [name], I sent a request for medical clearance letter to Dr. Ubaldo Glassing, so he can proceed with surgical intervention.

## 2018-10-03 DIAGNOSIS — H353221 Exudative age-related macular degeneration, left eye, with active choroidal neovascularization: Secondary | ICD-10-CM | POA: Diagnosis not present

## 2018-10-17 DIAGNOSIS — H353211 Exudative age-related macular degeneration, right eye, with active choroidal neovascularization: Secondary | ICD-10-CM | POA: Diagnosis not present

## 2018-10-19 ENCOUNTER — Encounter: Payer: Self-pay | Admitting: Podiatry

## 2018-10-19 ENCOUNTER — Ambulatory Visit (INDEPENDENT_AMBULATORY_CARE_PROVIDER_SITE_OTHER): Payer: Medicare Other | Admitting: Podiatry

## 2018-10-19 DIAGNOSIS — E1142 Type 2 diabetes mellitus with diabetic polyneuropathy: Secondary | ICD-10-CM

## 2018-10-19 DIAGNOSIS — L97521 Non-pressure chronic ulcer of other part of left foot limited to breakdown of skin: Secondary | ICD-10-CM

## 2018-10-19 NOTE — Progress Notes (Signed)
She presents today for follow-up of ulceration to the second digit of the left foot.  She states that seems to be doing okay at this point.  Objective: Vital signs are stable she is alert and oriented x3.  Due to the chronicity of this wound we are planning on amputating this in the near future.  However until cardiac clearance is permitted we are continuing to take care of it palliatively.  I debrided all reactive hyperkeratotic tissue today wound still does not probe deep to bone.  Assessment: Ulcer second digit left.  Plan: Debridement of the area today and redressed with a dry sterile compressive dressing continue conservative care until we have permission from cardiology to amputate the toe.

## 2018-10-21 DIAGNOSIS — I341 Nonrheumatic mitral (valve) prolapse: Secondary | ICD-10-CM | POA: Diagnosis not present

## 2018-10-21 DIAGNOSIS — I4811 Longstanding persistent atrial fibrillation: Secondary | ICD-10-CM | POA: Diagnosis not present

## 2018-10-24 ENCOUNTER — Ambulatory Visit
Admission: RE | Admit: 2018-10-24 | Discharge: 2018-10-24 | Disposition: A | Discharge status: Home / Self Care | Payer: Medicare Other | Source: Ambulatory Visit | Attending: Radiation Oncology | Admitting: Radiation Oncology

## 2018-10-24 DIAGNOSIS — R911 Solitary pulmonary nodule: Secondary | ICD-10-CM | POA: Insufficient documentation

## 2018-10-24 DIAGNOSIS — J432 Centrilobular emphysema: Secondary | ICD-10-CM | POA: Diagnosis not present

## 2018-10-24 LAB — POCT I-STAT CREATININE: Creatinine, Ser: 0.6 mg/dL (ref 0.44–1.00)

## 2018-10-24 MED ORDER — IOHEXOL 350 MG/ML SOLN
60.0000 mL | Freq: Once | INTRAVENOUS | Status: AC | PRN
  Administered 2018-10-24: 60 mL via INTRAVENOUS

## 2018-10-31 DIAGNOSIS — H40003 Preglaucoma, unspecified, bilateral: Secondary | ICD-10-CM | POA: Diagnosis not present

## 2018-11-02 ENCOUNTER — Other Ambulatory Visit: Payer: Self-pay

## 2018-11-02 ENCOUNTER — Ambulatory Visit
Admission: RE | Admit: 2018-11-02 | Discharge: 2018-11-02 | Disposition: A | Discharge status: Home / Self Care | Payer: Medicare Other | Source: Ambulatory Visit | Attending: Radiation Oncology | Admitting: Radiation Oncology

## 2018-11-02 ENCOUNTER — Encounter: Payer: Self-pay | Admitting: Radiation Oncology

## 2018-11-02 VITALS — BP 136/83 | HR 95 | Temp 97.8°F | Resp 18 | Wt 155.3 lb

## 2018-11-02 DIAGNOSIS — Z170 Estrogen receptor positive status [ER+]: Secondary | ICD-10-CM | POA: Insufficient documentation

## 2018-11-02 DIAGNOSIS — C50412 Malignant neoplasm of upper-outer quadrant of left female breast: Secondary | ICD-10-CM

## 2018-11-02 DIAGNOSIS — Z923 Personal history of irradiation: Secondary | ICD-10-CM | POA: Insufficient documentation

## 2018-11-02 DIAGNOSIS — Z79810 Long term (current) use of selective estrogen receptor modulators (SERMs): Secondary | ICD-10-CM | POA: Insufficient documentation

## 2018-11-02 NOTE — Progress Notes (Signed)
Radiation Oncology Follow up Note  Name: Holly Rose   Date:   11/02/2018 MRN:  [id number] DOB: Jun 19, 1930    This 85 y.o. 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  REFERRING PROVIDER: [name], MD  HPI: patient is a in a 66-year-old female now out 1 year having completed whole breast radiation to her left breast for a stage I ER/PR positive invasive mammary carcinoma. She was had some trauma associated with one of her treatments CT scan at that time showed scarring around her pacemaker. Also a 6 mm nodule in the right lower lobe was noted. We did a follow-up CT scan that nodule has disappeared there is another 2 mm nodule not thought to be clinically significant.She underwent also rehabilitation for her left shoulder and pain has improved. She specifically denies cough breast tenderness..  COMPLICATIONS OF TREATMENT: none  FOLLOW UP COMPLIANCE: keeps appointments   PHYSICAL EXAM:  BP 136/83   Pulse 95   Temp 97.8 F (36.6 C)   Resp 18   Wt 155 lb 5 oz (70.5 kg)   LMP  (LMP Unknown)   BMI 27.95 kg/m  Lungs are clear to A&P cardiac examination essentially unremarkable with regular rate and rhythm. No dominant mass or nodularity is noted in either breast in 2 positions examined. Incision is well-healed. No axillary or supraclavicular adenopathy is appreciated. Cosmetic result is excellent.Well-developed well-nourished patient in NAD. HEENT reveals PERLA, EOMI, discs not visualized.  Oral cavity is clear. No oral mucosal lesions are identified. Neck is clear without evidence of cervical or supraclavicular adenopathy. Lungs are clear to A&P. Cardiac examination is essentially unremarkable with regular rate and rhythm without murmur rub or thrill. Abdomen is benign with no organomegaly or masses noted. Motor sensory and DTR levels are equal and symmetric in the  upper and lower extremities. Cranial nerves II through XII are grossly intact. Proprioception is intact. No peripheral adenopathy or edema is identified. No motor or sensory levels are noted. Crude visual fields are within normal range.  RADIOLOGY RESULTS: CT scans reviewed compatible with the above-stated findings  PLAN: this time patient is doing well I'm please with resolution of the 6 mm nodule. 2 mm nodule is noted although we will not order any follow-up CT scans at this time.She is scheduled for mammograms in May 2020.she's currently on tamoxifen tolerating that well without side effect. I've asked to see her back in 1 year for follow-up. Patient family know to call with any concerns at any time.  I would like to take this opportunity to thank you for allowing me to participate in the care of your patient.Noreene Filbert, MD

## 2018-11-14 ENCOUNTER — Ambulatory Visit: Payer: Medicare Other | Admitting: Podiatry

## 2018-11-14 DIAGNOSIS — H353221 Exudative age-related macular degeneration, left eye, with active choroidal neovascularization: Secondary | ICD-10-CM | POA: Diagnosis not present

## 2018-11-16 ENCOUNTER — Encounter: Payer: Self-pay | Admitting: Podiatry

## 2018-11-16 ENCOUNTER — Telehealth: Payer: Self-pay | Admitting: *Deleted

## 2018-11-16 ENCOUNTER — Ambulatory Visit (INDEPENDENT_AMBULATORY_CARE_PROVIDER_SITE_OTHER): Payer: Medicare Other | Admitting: Podiatry

## 2018-11-16 DIAGNOSIS — L97521 Non-pressure chronic ulcer of other part of left foot limited to breakdown of skin: Secondary | ICD-10-CM | POA: Diagnosis not present

## 2018-11-16 DIAGNOSIS — E1142 Type 2 diabetes mellitus with diabetic polyneuropathy: Secondary | ICD-10-CM

## 2018-11-16 MED ORDER — DOXYCYCLINE HYCLATE 100 MG PO TABS: 100.0000 mg | ORAL_TABLET | Freq: Two times a day (BID) | ORAL | 0 refills | Status: DC

## 2018-11-16 NOTE — Telephone Encounter (Signed)
I am calling to schedule your surgery.  [name] can do it on March 13.  "Okay, my husband said I needed to go ahead and get it over with.  So go ahead and schedule me for that day.  What time do I need to be there?"  Someone from the surgical center will give you a call a day or two prior to your surgery date.  They will give you your arrival time.  Can you register with the surgical center online?  "No, I don't have a computer.  I can get my daughter-in-law to do it for me.  What's the number she needs to call?"  The instructions are in the [facility] brochure that we gave you.  "Okay, I'll get her to take care of it.  How long does my surgery take?"  Your procedure takes about one hour.  You may be there about three hours total.

## 2018-11-16 NOTE — Progress Notes (Signed)
Holly Rose presents today for follow-up of ulceration second digit left foot states that it looks red.  Objective: Vital signs are stable alert and oriented x3.  Pulses are palpable.  Ulcerative lesion distal aspect second digit with erythema.  Assessment: Diabetic ulceration second toe possible osteomyelitis.  Plan: Discussed etiology pathology and surgical therapies at this point I debrided the wound today placed Iodosorb under occlusion and she will continue to dress the toe on a daily basis until we get clearance from cardiology for surgical intervention.

## 2018-11-16 NOTE — Telephone Encounter (Signed)
-----   Message from Rip Harbour, [facility] sent at 11/16/2018  2:06 PM EST ----- Regarding: Medical Clearance Can you follow up on her med clearance for surgery? She needs to have toe amputated soon, its getting worse.  Thanks!!

## 2018-11-18 ENCOUNTER — Telehealth: Payer: Self-pay | Admitting: *Deleted

## 2018-11-18 NOTE — Telephone Encounter (Signed)
I called and informed Holly Rose that Dr. Prudence Davidson said he would see the patient on Monday.  I asked her to call the patient to schedule her an appointment.

## 2018-11-18 NOTE — Telephone Encounter (Signed)
Requesting culture on this patient with wound and drainage on foot. Thank you.  [place] [employer] [place] [address] [phone] (F)  ------------------------------------------------------------------------------------------------------------------------------------------------------------ Ms. Holly Rose is scheduled to have surgery on Friday of next week  Can someone see her and do a culture?  [name] is off on Monday and will not be in [place] office until Wednesday.

## 2018-11-21 ENCOUNTER — Encounter: Payer: Self-pay | Admitting: Podiatry

## 2018-11-21 ENCOUNTER — Ambulatory Visit (INDEPENDENT_AMBULATORY_CARE_PROVIDER_SITE_OTHER): Payer: Medicare Other | Admitting: Podiatry

## 2018-11-21 DIAGNOSIS — L97521 Non-pressure chronic ulcer of other part of left foot limited to breakdown of skin: Secondary | ICD-10-CM | POA: Diagnosis not present

## 2018-11-21 DIAGNOSIS — L02612 Cutaneous abscess of left foot: Secondary | ICD-10-CM | POA: Diagnosis not present

## 2018-11-21 DIAGNOSIS — L03032 Cellulitis of left toe: Secondary | ICD-10-CM

## 2018-11-21 NOTE — Progress Notes (Signed)
A culture and sensitivity was performed on her second toe upon the request of Pamala Hurry dowdy from the [facility] specialty surgical center.  She said she wanted to have the results of the culture prior to this patient scheduled surgery on Friday.  The culturette was sent to [facility].   Gardiner Barefoot DPM

## 2018-11-22 NOTE — Telephone Encounter (Signed)
Its not draining.  Its osteomyelitis.  Let Holly Rose know please

## 2018-11-23 ENCOUNTER — Other Ambulatory Visit: Payer: Self-pay | Admitting: Podiatry

## 2018-11-23 MED ORDER — TRAMADOL HCL 50 MG PO TABS: 50.0000 mg | ORAL_TABLET | Freq: Three times a day (TID) | ORAL | 0 refills | Status: AC | PRN

## 2018-11-23 MED ORDER — DOXYCYCLINE HYCLATE 100 MG PO TABS: 100.0000 mg | ORAL_TABLET | Freq: Two times a day (BID) | ORAL | 0 refills | Status: DC

## 2018-11-23 MED ORDER — ONDANSETRON HCL 4 MG PO TABS: 4.0000 mg | ORAL_TABLET | Freq: Three times a day (TID) | ORAL | 0 refills | Status: DC | PRN

## 2018-11-23 NOTE — Telephone Encounter (Signed)
I emailed Pamala Hurry the negative lab results.

## 2018-11-23 NOTE — Telephone Encounter (Signed)
Thank you :)

## 2018-11-24 LAB — WOUND CULTURE: Organism ID, Bacteria: NONE SEEN

## 2018-11-25 ENCOUNTER — Encounter: Payer: Self-pay | Admitting: Podiatry

## 2018-11-25 DIAGNOSIS — E1142 Type 2 diabetes mellitus with diabetic polyneuropathy: Secondary | ICD-10-CM | POA: Diagnosis not present

## 2018-11-25 DIAGNOSIS — M86672 Other chronic osteomyelitis, left ankle and foot: Secondary | ICD-10-CM | POA: Diagnosis not present

## 2018-11-25 DIAGNOSIS — M86172 Other acute osteomyelitis, left ankle and foot: Secondary | ICD-10-CM | POA: Diagnosis not present

## 2018-11-25 DIAGNOSIS — Z950 Presence of cardiac pacemaker: Secondary | ICD-10-CM | POA: Diagnosis not present

## 2018-11-25 DIAGNOSIS — M205X2 Other deformities of toe(s) (acquired), left foot: Secondary | ICD-10-CM | POA: Diagnosis not present

## 2018-11-25 DIAGNOSIS — L97521 Non-pressure chronic ulcer of other part of left foot limited to breakdown of skin: Secondary | ICD-10-CM | POA: Diagnosis not present

## 2018-11-29 ENCOUNTER — Encounter: Payer: Self-pay | Admitting: Podiatry

## 2018-11-30 ENCOUNTER — Other Ambulatory Visit: Payer: Self-pay

## 2018-11-30 ENCOUNTER — Ambulatory Visit: Payer: Medicare Other | Admitting: Podiatry

## 2018-11-30 ENCOUNTER — Ambulatory Visit (INDEPENDENT_AMBULATORY_CARE_PROVIDER_SITE_OTHER): Payer: Medicare Other

## 2018-11-30 ENCOUNTER — Ambulatory Visit (INDEPENDENT_AMBULATORY_CARE_PROVIDER_SITE_OTHER): Payer: Self-pay | Admitting: Podiatry

## 2018-11-30 DIAGNOSIS — L97521 Non-pressure chronic ulcer of other part of left foot limited to breakdown of skin: Secondary | ICD-10-CM

## 2018-11-30 DIAGNOSIS — Z9889 Other specified postprocedural states: Secondary | ICD-10-CM

## 2018-11-30 NOTE — Progress Notes (Signed)
She presents today for her first postop regarding amputation of the second toe left foot and tenotomies fourth and first.  She seems to be doing very well she says she is has some soreness in her right foot and the fourth toe.  Objective: Vital signs are stable alert oriented x3.  The fourth toe right foot appears to be mildly erythematous and tender on palpation but there is no signs of purulence no malodor no lacerations of the skin noted reason for this to be infected.  Left foot appears to be healing very nicely sutures are intact margins well coapted there is no erythema cellulitis drainage or odor.  Assessment: Well-healing surgical foot.  Plan: Redressed today dressed a compressive dressing put her back in her Darco shoe I will follow-up with her in 1 week.  Sutures probably will not be removed at that time.

## 2018-12-06 ENCOUNTER — Ambulatory Visit: Payer: Medicare Other | Admitting: Internal Medicine

## 2018-12-07 ENCOUNTER — Other Ambulatory Visit: Payer: Self-pay

## 2018-12-07 ENCOUNTER — Ambulatory Visit (INDEPENDENT_AMBULATORY_CARE_PROVIDER_SITE_OTHER): Payer: Medicare Other | Admitting: Podiatry

## 2018-12-07 ENCOUNTER — Encounter: Payer: Self-pay | Admitting: Podiatry

## 2018-12-07 DIAGNOSIS — Z9889 Other specified postprocedural states: Secondary | ICD-10-CM

## 2018-12-07 NOTE — Progress Notes (Signed)
She presents today for her second postop visit date of surgery 11/25/2018 status post amputation second toe left foot with tenotomies to the hallux and the fourth toe left.  She states that it seems to be doing better everything is much less painful now.  Objective: Vital signs are stable alert oriented x3 no erythema edema cellulitis drainage odor we remove the sutures from the plantar aspect of the hallux and the fourth toe on that appears to be doing very well sitting nice and rectus with forefoot loading.  Her second toe is sitting rectus as well I think we will leave the stitches in there just to assure that there are all the closed margins that we need to I just do not want anything opening up to could cause any infections.  Assessment: Well-healing surgical toe and tenotomies.  Plan: Follow-up with me in 1 week and we will remove the remainder of the stitches.  May need to consider some type of similar tenotomies to the right foot.

## 2018-12-14 ENCOUNTER — Ambulatory Visit (INDEPENDENT_AMBULATORY_CARE_PROVIDER_SITE_OTHER): Payer: Medicare Other

## 2018-12-14 ENCOUNTER — Other Ambulatory Visit: Payer: Self-pay

## 2018-12-14 ENCOUNTER — Other Ambulatory Visit: Payer: Self-pay | Admitting: Podiatry

## 2018-12-14 ENCOUNTER — Ambulatory Visit (INDEPENDENT_AMBULATORY_CARE_PROVIDER_SITE_OTHER): Payer: Medicare Other | Admitting: Podiatry

## 2018-12-14 ENCOUNTER — Encounter: Payer: Self-pay | Admitting: Podiatry

## 2018-12-14 VITALS — Temp 99.5°F

## 2018-12-14 DIAGNOSIS — Z9889 Other specified postprocedural states: Secondary | ICD-10-CM

## 2018-12-14 DIAGNOSIS — M86672 Other chronic osteomyelitis, left ankle and foot: Secondary | ICD-10-CM

## 2018-12-14 NOTE — Progress Notes (Signed)
Subjective:  Patient ID: Holly Rose, female    DOB: Nov 10, 1929,  MRN: [id number]  Chief Complaint  Patient presents with  . Routine Post Op    pov#2 dos 03.13.2020 Amputation Toe Interphalangeal 2nd Lt, Tenotomy Single 1st & 4th Lt   "its doing good"    . Suture / Staple Removal    Sutures out today    DOS: 11/25/2018 Procedure: amoutation 2nd toe left foot (IPJ), Hallux and 4th toe tenotomies.  85 y.o. female returns for post-op check.  Denies postop issues.  Denies pain  Review of Systems: Negative except as noted in the HPI. Denies N/V/F/Ch.  Past Medical History:  Diagnosis Date  . Allergy   . Anemia   . Arthritis   . Atrial fibrillation ([place]) 2004  . Breast cancer, left ([place]) 10/2017   Lumpectomy and rad tx's.   [place] CAD (coronary artery disease)   . Complication of anesthesia    hard to wake up with general anesthesia  . Diabetes ([place])   . Dysrhythmia    A-fib  . Heart attack ([place]) 2008  . Heart murmur   . History of kidney stones   . History of sick sinus syndrome    s/p pacemaker placement  . Hyperlipidemia   . Macular degeneration   . Neuropathy   . Neuropathy   . Pacemaker 06/2010  . Personal history of radiation therapy 11/2017   LEFT lumpectomy   . Squamous cell skin cancer   . TIA (transient ischemic attack)     Current Outpatient Medications:  .  Aflibercept (EYLEA) 2 MG/0.05ML SOLN, 1 each by Intravitreal route as directed. , Disp: , Rfl:  .  aspirin 81 MG tablet, Take 81 mg by mouth daily., Disp: , Rfl:  .  Biotin w/ Vitamins C & E (HAIR/SKIN/NAILS PO), Take 1 tablet by mouth daily., Disp: , Rfl:  .  blood glucose meter kit and supplies, Per patient please dispense One touch Ultra2 meter.Use meter and supplies three times a day to check blood sugar. ICD10: E11.40, Disp: 1 each, Rfl: 0 .  Cholecalciferol (D3-50 PO), Take 3 tablets by mouth daily. , Disp: , Rfl:  .  doxycycline (VIBRA-TABS) 100 MG tablet, Take 1 tablet (100 mg total) by mouth 2  (two) times daily., Disp: 20 tablet, Rfl: 0 .  doxycycline (VIBRA-TABS) 100 MG tablet, Take 1 tablet (100 mg total) by mouth 2 (two) times daily., Disp: 20 tablet, Rfl: 0 .  furosemide (LASIX) 20 MG tablet, Take 20 mg by mouth daily. , Disp: , Rfl:  .  glucose blood (ONE TOUCH ULTRA TEST) test strip, USE 1 STRIP TO CHECK GLUCOSE THREE TIMES DAILY, Disp: 100 each, Rfl: 6 .  meclizine (ANTIVERT) 25 MG tablet, Take 1 tablet (25 mg total) by mouth 3 (three) times daily as needed for dizziness., Disp: 30 tablet, Rfl: 0 .  metFORMIN (GLUCOPHAGE-XR) 500 MG 24 hr tablet, TAKE 2 TABLETS BY MOUTH TWICE DAILY, Disp: 360 tablet, Rfl: 2 .  metoprolol (LOPRESSOR) 50 MG tablet, Take 25 mg by mouth 2 (two) times daily. , Disp: , Rfl:  .  Multiple Vitamins-Minerals (PRESERVISION AREDS 2 PO), Take 2 tablets by mouth daily. , Disp: , Rfl:  .  ondansetron (ZOFRAN) 4 MG tablet, Take 1 tablet (4 mg total) by mouth every 8 (eight) hours as needed for nausea or vomiting., Disp: 20 tablet, Rfl: 0 .  Polyethyl Glycol-Propyl Glycol (SYSTANE OP), Apply 1 drop to eye 2 (two) times daily., Disp: ,  Rfl:  .  tamoxifen (NOLVADEX) 20 MG tablet, TAKE 1 TABLET BY MOUTH ONCE DAILY, Disp: 90 tablet, Rfl: 3 .  UNABLE TO FIND, Take by mouth., Disp: , Rfl:   Social History   Tobacco Use  Smoking Status Former Smoker  . Years: 5.00  . Last attempt to quit: 09/14/1968  . Years since quitting: 50.2  Smokeless Tobacco Never Used  Tobacco Comment   quit 1970    Allergies  Allergen Reactions  . Lyrica [Pregabalin] Swelling and Other (See Comments)    Other reaction(s): SWELLING/EDEMA Sleepy, does not eat  . Neurontin [Gabapentin] Nausea And Vomiting and Other (See Comments)    disoriented  . Bactrim [Sulfamethoxazole-Trimethoprim]     Made her dizziness   Objective:   Vitals:   12/14/18 1429  Temp: 99.5 F (37.5 C)   There is no height or weight on file to calculate BMI. Constitutional Well developed. Well nourished.   Vascular Foot warm and well perfused. Capillary refill normal to all digits.   Neurologic Normal speech. Oriented to person, place, and time. Epicritic sensation to light touch grossly present bilaterally.  Dermatologic Skin well-healed without signs of warmth erythema or infection  Orthopedic: No tenderness to palpation noted about the surgical site.   Radiographs: Taken reviewed consistent with postop state Assessment:   1. S/P foot surgery, left    Plan:  Patient was evaluated and treated and all questions answered.  S/p foot surgery left -Progressing as expected post-operatively. -XR: as above -WB Status: WBAT in sx shoe -Sutures: out. -Medications: none refilled today. -Follow-up in 2 to 3 weeks for transition to normal shoe gear  Return in about 3 weeks (around 01/04/2019) for Post-op, Left.

## 2018-12-21 ENCOUNTER — Other Ambulatory Visit: Payer: Medicare Other

## 2018-12-22 DIAGNOSIS — E1142 Type 2 diabetes mellitus with diabetic polyneuropathy: Secondary | ICD-10-CM | POA: Diagnosis not present

## 2018-12-26 DIAGNOSIS — H353211 Exudative age-related macular degeneration, right eye, with active choroidal neovascularization: Secondary | ICD-10-CM | POA: Diagnosis not present

## 2018-12-26 DIAGNOSIS — H353221 Exudative age-related macular degeneration, left eye, with active choroidal neovascularization: Secondary | ICD-10-CM | POA: Diagnosis not present

## 2019-01-04 ENCOUNTER — Ambulatory Visit (INDEPENDENT_AMBULATORY_CARE_PROVIDER_SITE_OTHER): Payer: Medicare Other | Admitting: Podiatry

## 2019-01-04 ENCOUNTER — Other Ambulatory Visit: Payer: Medicare Other

## 2019-01-04 ENCOUNTER — Other Ambulatory Visit: Payer: Self-pay

## 2019-01-04 ENCOUNTER — Encounter: Payer: Self-pay | Admitting: Podiatry

## 2019-01-04 VITALS — Temp 97.5°F

## 2019-01-04 DIAGNOSIS — M79609 Pain in unspecified limb: Secondary | ICD-10-CM

## 2019-01-04 DIAGNOSIS — Q828 Other specified congenital malformations of skin: Secondary | ICD-10-CM

## 2019-01-04 DIAGNOSIS — E1142 Type 2 diabetes mellitus with diabetic polyneuropathy: Secondary | ICD-10-CM

## 2019-01-04 DIAGNOSIS — L97521 Non-pressure chronic ulcer of other part of left foot limited to breakdown of skin: Secondary | ICD-10-CM | POA: Diagnosis not present

## 2019-01-04 DIAGNOSIS — Z9889 Other specified postprocedural states: Secondary | ICD-10-CM | POA: Diagnosis not present

## 2019-01-04 DIAGNOSIS — M79676 Pain in unspecified toe(s): Secondary | ICD-10-CM

## 2019-01-04 DIAGNOSIS — B351 Tinea unguium: Secondary | ICD-10-CM

## 2019-01-04 NOTE — Progress Notes (Signed)
She presents today for postop visit regarding her second toe left foot.  She denies fever chills nausea vomiting muscle aches pains calf pain back pain chest pain shortness of breath.  Small reactive hyperkeratotic lesion plantar aspect of the second metatarsal head is present.  No signs of infection.  Assessment: Well-healing surgical foot.  Porokeratotic lesion plantarly.  Plan: Debridement of reactive hyperkeratotic tissue will follow-up with her in about 2 months.

## 2019-01-17 ENCOUNTER — Other Ambulatory Visit: Payer: Medicare Other

## 2019-01-20 ENCOUNTER — Ambulatory Visit: Payer: Medicare Other

## 2019-01-23 ENCOUNTER — Ambulatory Visit: Payer: Medicare Other | Admitting: Oncology

## 2019-01-25 ENCOUNTER — Ambulatory Visit (INDEPENDENT_AMBULATORY_CARE_PROVIDER_SITE_OTHER): Payer: Medicare Other | Admitting: Podiatry

## 2019-01-25 ENCOUNTER — Encounter: Payer: Self-pay | Admitting: Podiatry

## 2019-01-25 ENCOUNTER — Other Ambulatory Visit: Payer: Self-pay

## 2019-01-25 VITALS — Temp 97.3°F

## 2019-01-25 DIAGNOSIS — Q828 Other specified congenital malformations of skin: Secondary | ICD-10-CM

## 2019-01-25 DIAGNOSIS — M79609 Pain in unspecified limb: Secondary | ICD-10-CM

## 2019-01-25 DIAGNOSIS — E1142 Type 2 diabetes mellitus with diabetic polyneuropathy: Secondary | ICD-10-CM

## 2019-01-25 DIAGNOSIS — B351 Tinea unguium: Secondary | ICD-10-CM

## 2019-01-25 NOTE — Progress Notes (Signed)
She presents today chief complaint of painful elongated toenails and calluses bilaterally.  Objective: Toenails were thick yellow dystrophic clinically mycotic bilaterally.  Multiple reactive hyperkeratotic lesions plantar aspect of the bilateral foot.  Assessment: Pain in limb.  Onychomycosis.  Plan: Debridement of reactive hyperkeratosis and toenails.

## 2019-02-20 DIAGNOSIS — H353211 Exudative age-related macular degeneration, right eye, with active choroidal neovascularization: Secondary | ICD-10-CM | POA: Diagnosis not present

## 2019-02-21 DIAGNOSIS — I495 Sick sinus syndrome: Secondary | ICD-10-CM | POA: Diagnosis not present

## 2019-02-28 ENCOUNTER — Other Ambulatory Visit: Payer: Self-pay

## 2019-02-28 ENCOUNTER — Ambulatory Visit
Admission: RE | Admit: 2019-02-28 | Discharge: 2019-02-28 | Disposition: A | Discharge status: Home / Self Care | Payer: Medicare Other | Source: Ambulatory Visit | Attending: Oncology | Admitting: Oncology

## 2019-02-28 DIAGNOSIS — Z170 Estrogen receptor positive status [ER+]: Secondary | ICD-10-CM

## 2019-02-28 DIAGNOSIS — Z853 Personal history of malignant neoplasm of breast: Secondary | ICD-10-CM | POA: Diagnosis not present

## 2019-02-28 DIAGNOSIS — C50412 Malignant neoplasm of upper-outer quadrant of left female breast: Secondary | ICD-10-CM

## 2019-02-28 DIAGNOSIS — R928 Other abnormal and inconclusive findings on diagnostic imaging of breast: Secondary | ICD-10-CM | POA: Diagnosis not present

## 2019-03-01 DIAGNOSIS — M810 Age-related osteoporosis without current pathological fracture: Secondary | ICD-10-CM | POA: Insufficient documentation

## 2019-03-01 DIAGNOSIS — E1142 Type 2 diabetes mellitus with diabetic polyneuropathy: Secondary | ICD-10-CM | POA: Diagnosis not present

## 2019-03-01 DIAGNOSIS — E785 Hyperlipidemia, unspecified: Secondary | ICD-10-CM | POA: Diagnosis not present

## 2019-03-01 DIAGNOSIS — E1169 Type 2 diabetes mellitus with other specified complication: Secondary | ICD-10-CM | POA: Diagnosis not present

## 2019-03-03 DIAGNOSIS — I4891 Unspecified atrial fibrillation: Secondary | ICD-10-CM | POA: Diagnosis not present

## 2019-03-03 DIAGNOSIS — R001 Bradycardia, unspecified: Secondary | ICD-10-CM | POA: Diagnosis not present

## 2019-03-03 DIAGNOSIS — I4811 Longstanding persistent atrial fibrillation: Secondary | ICD-10-CM | POA: Diagnosis not present

## 2019-03-03 DIAGNOSIS — I341 Nonrheumatic mitral (valve) prolapse: Secondary | ICD-10-CM | POA: Diagnosis not present

## 2019-03-06 DIAGNOSIS — R001 Bradycardia, unspecified: Secondary | ICD-10-CM | POA: Diagnosis not present

## 2019-03-07 ENCOUNTER — Ambulatory Visit: Payer: Medicare Other | Admitting: Oncology

## 2019-03-08 ENCOUNTER — Other Ambulatory Visit: Payer: Self-pay

## 2019-03-08 ENCOUNTER — Ambulatory Visit (INDEPENDENT_AMBULATORY_CARE_PROVIDER_SITE_OTHER): Payer: Medicare Other | Admitting: Podiatry

## 2019-03-08 DIAGNOSIS — E1142 Type 2 diabetes mellitus with diabetic polyneuropathy: Secondary | ICD-10-CM | POA: Diagnosis not present

## 2019-03-08 DIAGNOSIS — Q828 Other specified congenital malformations of skin: Secondary | ICD-10-CM

## 2019-03-08 NOTE — Progress Notes (Signed)
She presents today for a callus on the forefoot that needs to be trimmed.  Objective: Pulses are palpable.  She has no open lesions or wounds reactive hyperkeratotic lesion porokeratotic lesion sub-second and third metatarsals bilateral foot.  Assessment: Porokeratosis.  Plan: Debridement of reactive hyperkeratotic tissue bilaterally.

## 2019-03-12 NOTE — Progress Notes (Signed)
[place]  Telephone:([phone] [phone]  ID: Holly Rose OB: 1930-09-06  MR#: [id number]  WIO#:[id number]  Patient Care Team: [name], MD as PCP - General (Internal Medicine) [name], MD (Internal Medicine) Bary Castilla Forest Gleason, MD (General Surgery)  I connected with Holly Rose on 03/15/19 at 10:00 AM EDT by telephone visit and verified that I am speaking with the correct person using two identifiers.   I discussed the limitations, risks, security and privacy concerns of performing an evaluation and management service by telemedicine and the availability of in-person appointments. I also discussed with the patient that there may be a patient responsible charge related to this service. The patient expressed understanding and agreed to proceed.   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  INTERVAL HISTORY: Patient agreed to routine 27-monthevaluation by telephone.  She continues to feel well and remains asymptomatic.  She is tolerating tamoxifen without significant side effects.  She has no neurologic complaints. She denies any recent fevers or illnesses. She has a good appetite and denies weight loss.  She denies any chest pain, shortness of breath, cough, or hemoptysis.  She denies any nausea, vomiting, constipation, or diarrhea. She has no urinary complaints.  Patient feels at her baseline offers no specific complaints today.  REVIEW OF SYSTEMS:   Review of Systems  Constitutional: Negative.  Negative for fever, malaise/fatigue and weight loss.  Respiratory: Negative.  Negative for cough and shortness of breath.   Cardiovascular: Negative.  Negative for chest pain and leg swelling.   Gastrointestinal: Negative.  Negative for abdominal pain, blood in stool and melena.  Genitourinary: Negative.  Negative for dysuria.  Musculoskeletal: Negative.  Negative for back pain.  Skin: Negative.  Negative for rash.  Neurological: Negative.  Negative for sensory change, focal weakness, weakness and headaches.  Psychiatric/Behavioral: Negative.  The patient is not nervous/anxious.     As per HPI. Otherwise, a complete review of systems is negative.  PAST MEDICAL HISTORY: Past Medical History:  Diagnosis Date  . Allergy   . Anemia   . Arthritis   . Atrial fibrillation ([place] 2004  . Breast cancer, left ([place] 10/2017   Lumpectomy and rad tx's.   .Marland KitchenCAD (coronary artery disease)   . Complication of anesthesia    hard to wake up with general anesthesia  . Diabetes ([facility]   . Dysrhythmia    A-fib  . Heart attack ([place]  . Heart murmur   . History of kidney stones   . History of sick sinus syndrome    s/p pacemaker placement  . Hyperlipidemia   . Macular degeneration   . Neuropathy   . Neuropathy   . Pacemaker 06/2010  . Personal history of radiation therapy 11/2017   LEFT lumpectomy   . Squamous cell skin cancer   . TIA (transient ischemic attack)     PAST SURGICAL HISTORY: Past Surgical History:  Procedure Laterality Date  . ABDOMINAL HYSTERECTOMY  1960's  . APPENDECTOMY  1947  . [place] . BREAST BIOPSY Left 12/17/2016   SINGLE FRAGMENT OF ATYPICAL EPITHELIAL CELLS  . BRE[facility]SY [facility]08/2017   [facility]  . BREAST EXCISIONAL BIOPSY Left 01/18/1986   Benign microcalcifications, [employer]  [employer].  Marland Kitchen BREAST LUMPECTOMY Left 05/26/2017   13 mm,T1c, N0; ER/ PR+, Her 2 neu negative, HIGH RISK by Mammoprint ;  Surgeon: [name], MD;  Location: [facility] ORS;  Service: General;  Laterality: Left;  . BREAST SURGERY  1988  . CARDIAC CATHETERIZATION  1989  . CATARACT EXTRACTION  1997 and 1998  . COLONOSCOPY WITH PROPOFOL N/A 09/12/2015    Procedure: COLONOSCOPY WITH PROPOFOL;  Surgeon: [name], MD;  Location[facility] ENDOSCOPY;  Service: Endoscopy;  Laterality: N/A;  . KIDNEY STONE SURGERY  2018  . MOHS SURGERY  2019   forehead  . pace maker  2008  . [place]  . TONSILLECTOMY AND ADENOIDECTOMY  1950"s    FAMILY HISTORY: Family History  Problem Relation Age of Onset  . Stroke Mother   . Diabetes Mother   . Cancer Maternal Aunt        Breast Cancer  . Breast cancer Maternal Aunt   . Arthritis Maternal Grandmother   . Cancer Maternal Aunt        Lung Cancer  . Diabetes Son   . Cancer Son   . Kidney disease Neg Hx   . Bladder Cancer Neg Hx     ADVANCED DIRECTIVES (Y/N):  N  HEALTH MAINTENANCE: Social History   Tobacco Use  . Smoking status: Former Smoker    Years: 5.00    Quit date: 09/14/1968    Years since quitting: 50.5  . Smokeless tobacco: Never Used  . Tobacco comment: quit 1970  Substance Use Topics  . Alcohol use: No    Alcohol/week: 0.0 standard drinks  . Drug use: No     Colonoscopy:  PAP:  Bone density:  Lipid panel:  Allergies  Allergen Reactions  . Lyrica [Pregabalin] Swelling and Other (See Comments)    Other reaction(s): SWELLING/EDEMA Sleepy, does not eat  . Neurontin [Gabapentin] Nausea And Vomiting and Other (See Comments)    disoriented  . Bactrim [Sulfamethoxazole-Trimethoprim]     Made her dizziness    Current Outpatient Medications  Medication Sig Dispense Refill  . Aflibercept (EYLEA) 2 MG/0.05ML SOLN 1 each by Intravitreal route as directed.     Marland Kitchen alendronate (FOSAMAX) 70 MG tablet Take by mouth.    Marland Kitchen aspirin 81 MG tablet Take 81 mg by mouth daily.    . Biotin w/ Vitamins C & E (HAIR/SKIN/NAILS PO) Take 1 tablet by mouth daily.    . blood glucose meter kit and supplies Per patient please dispense One touch Ultra2 meter.Use meter and supplies three times a day to check blood sugar. ICD10: E11.40 1 each 0  . Cholecalciferol (D3-50 PO) Take 3  tablets by mouth daily.     . furosemide (LASIX) 20 MG tablet Take 20 mg by mouth daily.     Marland Kitchen glucose blood (ONE TOUCH ULTRA TEST) test strip USE 1 STRIP TO CHECK GLUCOSE THREE TIMES DAILY 100 each 6  . metFORMIN (GLUCOPHAGE-XR) 500 MG 24 hr tablet TAKE 2 TABLETS BY MOUTH TWICE DAILY 360 tablet 2  . metoprolol (LOPRESSOR) 50 MG tablet Take 25 mg by mouth 2 (two) times daily.     . Multiple Vitamins-Minerals (PRESERVISION AREDS 2 PO) Take 2 tablets by mouth daily.     Vladimir Faster Glycol-Propyl Glycol (SYSTANE OP) Apply 1 drop to eye 2 (two) times daily.    . tamoxifen (NOLVADEX) 20 MG tablet TAKE 1 TABLET BY MOUTH ONCE DAILY 90 tablet 3  . UNABLE TO  FIND Take by mouth.     No current facility-administered medications for this visit.     OBJECTIVE: There were no vitals filed for this visit.   There is no height or weight on file to calculate BMI.    ECOG FS:0 - Asymptomatic   LAB RESULTS:  Lab Results  Component Value Date   NA 141 08/16/2018   K 3.8 08/16/2018   CL 109 08/16/2018   CO2 26 08/16/2018   GLUCOSE 101 (H) 08/16/2018   BUN 8 08/16/2018   CREATININE 0.60 10/24/2018   CALCIUM 9.2 08/16/2018   PROT 7.3 04/18/2018   ALBUMIN 4.0 04/18/2018   AST 24 04/18/2018   ALT 18 04/18/2018   ALKPHOS 42 04/18/2018   BILITOT 0.3 04/18/2018   GFRNONAA >60 08/16/2018   GFRAA >60 08/16/2018    Lab Results  Component Value Date   WBC 5.0 08/16/2018   NEUTROABS 2.6 04/18/2018   HGB 11.8 (L) 08/16/2018   HCT 37.0 08/16/2018   MCV 91.4 08/16/2018   PLT 141 (L) 08/16/2018     STUDIES: Mm Diag Breast Tomo Bilateral  Result Date: 02/28/2019 CLINICAL DATA:  85 year old female status post malignant left lumpectomy with radiation in 2018. No current symptoms. EXAM: DIGITAL DIAGNOSTIC BILATERAL MAMMOGRAM WITH CAD AND TOMO COMPARISON:  Previous exam(s). ACR Breast Density Category b: There are scattered areas of fibroglandular density. FINDINGS: Stable post lumpectomy changes are  identified within the left breast. No new or suspicious findings are identified in either breast. Mammographic images were processed with CAD. IMPRESSION: 1. No mammographic evidence of malignancy in either breast. 2. Stable left breast posttreatment changes. RECOMMENDATION: Diagnostic mammogram is suggested in 1 year. (Code:[id number]-B-01Y) I have discussed the findings and recommendations with the patient. Results were also provided in writing at the conclusion of the visit. If applicable, a reminder letter will be sent to the patient regarding the next appointment. BI-RADS CATEGORY  2: Benign. Electronically Signed   By: Kristopher Oppenheim M.D.   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  PLAN:    1.  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  She has now completed XRT.  Her most recent mammogram on February 28, 2019 was reported as BI-RADS 2.  Repeat in June 2021.  Continue tamoxifen for a minimum of 5 years completing in January 2024.  Given her high risk MammaPrint, patient may benefit from extended therapy.  Return to clinic in 6 months for routine evaluation.    2.  Osteoporosis: Bone mineral density from July 18, 2018 reported T score of -2.5.  This is unchanged from one year prior.  Continue tamoxifen as above.  Continue Fosamax, calcium, and vitamin D supplementation.  Repeat bone mineral density in November 2020.  I provided 15 minutes of non face-to-face telephone visit time during this encounter, and > 50% was spent counseling as documented under my assessment & plan.  Patient expressed understanding and was in agreement with this plan. She  also understands that She can call clinic at any time with any questions, concerns, or complaints.   Cancer Staging Malignant neoplasm of upper-outer quadrant of left breast in female, estrogen receptor positive (  [place]) Staging form: Breast, AJCC 8th Edition - Pathologic stage from 06/27/2017: Stage IA (pT1c, pN0, cM0, G1, ER: Positive, PR: Positive, HER2: Negative) - Signed by [name], MD on 06/27/2017   [name], MD   03/15/2019 6:57 AM

## 2019-03-13 ENCOUNTER — Other Ambulatory Visit: Payer: Self-pay

## 2019-03-13 ENCOUNTER — Ambulatory Visit (INDEPENDENT_AMBULATORY_CARE_PROVIDER_SITE_OTHER): Payer: Medicare Other

## 2019-03-13 ENCOUNTER — Other Ambulatory Visit: Payer: Self-pay | Admitting: Cardiology

## 2019-03-13 DIAGNOSIS — Z0000 Encounter for general adult medical examination without abnormal findings: Secondary | ICD-10-CM | POA: Diagnosis not present

## 2019-03-13 DIAGNOSIS — Z01812 Encounter for preprocedural laboratory examination: Secondary | ICD-10-CM

## 2019-03-13 NOTE — Patient Instructions (Addendum)
  Holly Rose , Thank you for taking time to come for your Medicare Wellness Visit. I appreciate your ongoing commitment to your health goals. Please review the following plan we discussed and let me know if I can assist you in the future.   These are the goals we discussed: Goals      Patient Stated   . Follow up with Primary Care Provider (pt-stated)     Maintain healthy lifestyle       This is a list of the screening recommended for you and due dates:  Health Maintenance  Topic Date Due  . Hemoglobin A1C  10/19/2018  . Complete foot exam   02/05/2019  . Urine Protein Check  04/19/2019  . Flu Shot  04/15/2019  . Tetanus Vaccine  09/15/2019  . Eye exam for diabetics  02/20/2020  . DEXA scan (bone density measurement)  Completed  . Pneumonia vaccines  Completed

## 2019-03-13 NOTE — Progress Notes (Signed)
Subjective:   Holly Rose is a 85 y.o. female who presents for Medicare Annual (Subsequent) preventive examination.  Review of Systems:  No ROS.  Medicare Wellness Virtual Visit.  Visual/audio telehealth visit, UTA vital signs.   See social history for additional risk factors.   Cardiac Risk Factors include: advanced age (>60mn, >>65women);diabetes mellitus     Objective:     Vitals: LMP  (LMP Unknown)   There is no height or weight on file to calculate BMI.  Advanced Directives 03/13/2019 11/02/2018 08/15/2018 08/15/2018 07/21/2018 05/03/2018 04/25/2018  Does Patient Have a Medical Advance Directive? Yes No Yes Yes Yes Yes Yes  Type of AParamedicof ACharlotte Court HouseLiving will - HBrackenridgeLiving will Living will - HCaptains CoveLiving will HKensingtonLiving will  Does patient want to make changes to medical advance directive? No - Patient declined - No - Patient declined - - No - Patient declined -  Copy of HClear Lakein Chart? No - copy requested - No - copy requested - - - -  Would patient like information on creating a medical advance directive? - No - Patient declined No - Patient declined - - - -    Tobacco Social History   Tobacco Use  Smoking Status Former Smoker  . Years: 5.00  . Quit date: 09/14/1968  . Years since quitting: 50.5  Smokeless Tobacco Never Used  Tobacco Comment   quit 1970     Counseling given: Not Answered Comment: quit 1970   Clinical Intake:  Pre-visit preparation completed: Yes        Diabetes: Yes(Followed by Endocrinology)  How often do you need to have someone help you when you read instructions, pamphlets, or other written materials from your doctor or pharmacy?: 2 - Rarely  Interpreter Needed?: No     Past Medical History:  Diagnosis Date  . Allergy   . Anemia   . Arthritis   . Atrial fibrillation ([place] 2004  . Breast cancer, left  ([facility] 10/2017   Lumpectomy and rad tx's.   .Marland KitchenCAD (coronary artery disease)   . Complication of anesthesia    hard to wake up with general anesthesia  . Diabetes ([facility]   . Dysrhythmia    A-fib  . Heart attack ([place] 2008  . Heart murmur   . History of kidney stones   . History of sick sinus syndrome    s/p pacemaker placement  . Hyperlipidemia   . Macular degeneration   . Neuropathy   . Neuropathy   . Pacemaker 06/2010  . Personal history of radiation therapy 11/2017   LEFT lumpectomy   . Squamous cell skin cancer   . TIA (transient ischemic attack)    Past Surgical History:  Procedure Laterality Date  . ABDOMINAL HYSTERECTOMY  1960's  . [place]EN[place]Y  1947  [place] . BREAST BIOPSY Left 12/17/2016   SINGLE FRAGMENT OF ATYPICAL EPITHELIAL CELLS  . [facility]   [facility]  . BREAST EXCISIONAL BIOPSY Left 01/18/1986   Benign microcalcifications, [employer].  .Marland KitchenBREAST LUMPECTOMY Left 05/26/2017   13 mm,T1c, N0; ER/ PR+, Her 2 neu negative, HIGH RISK by Mammoprint ;  Surgeon: BRobert Bellow MD;  Location: [facility] ORS;  Service: General;  Laterality: Left;  . BREAST SURGERY  1988  . CARDIAC CATHETERIZATION  1989  . CATARACT EXTRACTION  1997 and 1998  .  COLONOSCOPY WITH PROPOFOL N/A 09/12/2015   Procedure: COLONOSCOPY WITH PROPOFOL;  Surgeon: [name], MD;  Location[facility] ENDOSCOPY;  Service: Endoscopy;  Laterality: N/A;  . KIDNEY STONE SURGERY  2018  . MOHS SURGERY  2019   forehead  . pace maker  2008  . [place]  . TONSILLECTOMY AND ADENOIDECTOMY  1950"s   Family History  Problem Relation Age of Onset  . Stroke Mother   . Diabetes Mother   . Cancer Maternal Aunt        Breast Cancer  . Breast cancer Maternal Aunt   . Arthritis Maternal Grandmother   . Cancer Maternal Aunt        Lung Cancer  . Diabetes Son   . Cancer Son   . Kidney disease Neg Hx   . Bladder Cancer Neg Hx    Social History    Socioeconomic History  . Marital status: Married    Spouse name: [name]  . Number of children: 2  . Years of education: Not on file  . Highest education level: Not on file  Occupational History  . Not on file  Social Needs  . Financial resource strain: Not hard at all  . Food insecurity    Worry: Never true    Inability: Never true  . Transportation needs    Medical: No    Non-medical: No  Tobacco Use  . Smoking status: Former Smoker    Years: 5.00    Quit date: 09/14/1968    Years since quitting: 50.5  . Smokeless tobacco: Never Used  . Tobacco comment: quit 1970  Substance and Sexual Activity  . Alcohol use: No    Alcohol/week: 0.0 standard drinks  . Drug use: No  . Sexual activity: Not on file  Lifestyle  . Physical activity    Days per week: 2 days    Minutes per session: 20 min  . Stress: Not at all  Relationships  . Social Herbalist on phone: Three times a week    Gets together: Twice a week    Attends religious service: More than 4 times per year    Active member of club or organization: No    Attends meetings of clubs or organizations: Never    Relationship status: Married  Other Topics Concern  . Not on file  Social History Narrative   Live home with husband in [place]    Outpatient Encounter Medications as of 03/13/2019  Medication Sig  . Aflibercept (EYLEA) 2 MG/0.05ML SOLN 1 each by Intravitreal route as directed.   Marland Kitchen alendronate (FOSAMAX) 70 MG tablet Take by mouth.  Marland Kitchen aspirin 81 MG tablet Take 81 mg by mouth daily.  . Biotin w/ Vitamins C & E (HAIR/SKIN/NAILS PO) Take 1 tablet by mouth daily.  . blood glucose meter kit and supplies Per patient please dispense One touch Ultra2 meter.Use meter and supplies three times a day to check blood sugar. ICD10: E11.40  . Cholecalciferol (D3-50 PO) Take 3 tablets by mouth daily.   . furosemide (LASIX) 20 MG tablet Take 20 mg by mouth daily.   Marland Kitchen glucose blood (ONE TOUCH ULTRA TEST) test  strip USE 1 STRIP TO CHECK GLUCOSE THREE TIMES DAILY  . meclizine (ANTIVERT) 25 MG tablet Take 1 tablet (25 mg total) by mouth 3 (three) times daily as needed for dizziness.  . metFORMIN (GLUCOPHAGE-XR) 500 MG 24 hr tablet TAKE 2 TABLETS BY MOUTH TWICE DAILY  .  metoprolol (LOPRESSOR) 50 MG tablet Take 25 mg by mouth 2 (two) times daily.   . Multiple Vitamins-Minerals (PRESERVISION AREDS 2 PO) Take 2 tablets by mouth daily.   . ondansetron (ZOFRAN) 4 MG tablet Take 1 tablet (4 mg total) by mouth every 8 (eight) hours as needed for nausea or vomiting.  Vladimir Faster Glycol-Propyl Glycol (SYSTANE OP) Apply 1 drop to eye 2 (two) times daily.  . tamoxifen (NOLVADEX) 20 MG tablet TAKE 1 TABLET BY MOUTH ONCE DAILY  . UNABLE TO FIND Take by mouth.   No facility-administered encounter medications on file as of 03/13/2019.     Activities of Daily Living In your present state of health, do you have any difficulty performing the following activities: 03/13/2019 08/15/2018  Hearing? N Y  Vision? N Y  Difficulty concentrating or making decisions? N N  Walking or climbing stairs? N Y  Dressing or bathing? N N  Doing errands, shopping? Y Y  Comment Patient does not drive; macular degeneration -  Preparing Food and eating ? N -  Using the Toilet? N -  In the past six months, have you accidently leaked urine? N -  Do you have problems with loss of bowel control? N -  Managing your Medications? N -  Managing your Finances? Y -  Comment Husband manages the finances -  Housekeeping or managing your Housekeeping? Y -  Comment Maid assist -  Some recent data might be hidden    Patient Care Team: [name], MD as PCP - General (Internal Medicine) [name], MD (Internal Medicine) Bary Castilla Forest Gleason, MD (General Surgery)    Assessment:   This is a routine wellness examination for Thora.  I connected with patient 03/13/19 at 10:30 AM EDT by an audio enabled telemedicine application and  verified that I am speaking with the correct person using two identifiers. Patient stated full name and DOB. Patient gave permission to continue with virtual visit. Patient's location was at home and Nurse's location was at [place] office.   Health Screenings  Mammogram - 02/2019 Colonoscopy - 08/2015 Bone Density - 07/2018 Glaucoma -none Macular degeneration- yes Hearing -demonstrates normal hearing during visit. Labs followed by pcp Dental- dentures Vision- visits every 6 weeks Diabetes- followed by Endocrinology  Social  Alcohol intake - no        Smoking history- former  Smokers in home? none Illicit drug use? none Physical activity- stays active around the home Diet - regular Sexually Active -never BMI- discussed the importance of a healthy diet, water intake and the benefits of aerobic exercise.  Educational material provided.   Safety  Patient feels safe at home- yes Patient does have smoke detectors at home- yes Patient does wear sunscreen or protective clothing when in direct sunlight -yes Patient does wear seat belt when in a moving vehicle -yes Patient drives- no, macular degeneration.  Covid-19 precautions and sickness symptoms discussed.   Activities of Daily Living Patient denies needing assistance with: feeding themselves, getting from bed to chair, getting to the toilet, bathing/showering, dressing, or preparing meals.  Husband manages the finances.  Maid assist with household chores.   Depression Screen Patient denies losing interest in daily life, feeling hopeless, or crying easily over simple problems.   Medication-taking as directed and without issues.   Fall Screen Patient had 1 fall 7 months ago, no injury.  Followed by pcp.    Memory Screen Patient is alert.  Correctly identified the president of the [place], season and  recall. Patient likes to complete word seek puzzles for brain stimulation.  Immunizations The following Immunizations were  discussed: Influenza, shingles, pneumonia, and tetanus.   Other Providers Patient Care Team: [name], MD as PCP - General (Internal Medicine) [name], MD (Internal Medicine) Bary Castilla Forest Gleason, MD (General Surgery)  Exercise Activities and Dietary recommendations Current Exercise Habits: Home exercise routine, Type of exercise: walking, Intensity: Mild  Goals      Patient Stated   . Follow up with Primary Care Provider (pt-stated)     Maintain healthy lifestyle       Fall Risk Fall Risk  03/13/2019 09/29/2017 04/29/2017 08/05/2016 06/03/2016  Falls in the past year? 0 No No No No   Depression Screen PHQ 2/9 Scores 03/13/2019 09/29/2017 04/29/2017 08/05/2016  PHQ - 2 Score 0 0 0 0     Cognitive Function     6CIT Screen 03/13/2019 04/29/2017  What Year? 0 points 0 points  What month? 0 points 0 points  What time? 0 points 0 points  Count back from 20 0 points 0 points  Months in reverse 0 points 0 points  Repeat phrase 0 points 0 points  Total Score 0 0    Immunization History  Administered Date(s) Administered  . Influenza, High Dose Seasonal PF 06/03/2016, 07/13/2017, 05/27/2018  . Influenza, Seasonal, Injecte, Preservative Fre 06/24/2007, 06/16/2011  . Influenza,inj,Quad PF,6+ Mos 06/11/2015  . Pneumococcal Conjugate-13 11/16/2014  . Pneumococcal Polysaccharide-23 06/24/2007  . Td 09/14/2009  . Zoster 09/04/2013   Screening Tests Health Maintenance  Topic Date Due  . HEMOGLOBIN A1C  10/19/2018  . FOOT EXAM  02/05/2019  . URINE MICROALBUMIN  04/19/2019  . INFLUENZA VACCINE  04/15/2019  . TETANUS/TDAP  09/15/2019  . OPHTHALMOLOGY EXAM  02/20/2020  . DEXA SCAN  Completed  . PNA vac Low Risk Adult  Completed      Plan:    End of life planning; Advance aging; Advanced directives discussed.  Copy of current HCPOA/Living Will requested.    I have personally reviewed and noted the following in the patient's chart:   . Medical and social history .  Use of alcohol, tobacco or illicit drugs  . Current medications and supplements . Functional ability and status . Nutritional status . Physical activity . Advanced directives . List of other physicians . Hospitalizations, surgeries, and ER visits in previous 12 months . Vitals . Screenings to include cognitive, depression, and falls . Referrals and appointments  In addition, I have reviewed and discussed with patient certain preventive protocols, quality metrics, and best practice recommendations. A written personalized care plan for preventive services as well as general preventive health recommendations were provided to patient.     Varney Biles, LPN  [id number]/[id number]   Reviewed above information.  Agree with assessment and plan.    Dr [name]

## 2019-03-14 ENCOUNTER — Inpatient Hospital Stay: Payer: Medicare Other | Attending: Oncology | Admitting: Oncology

## 2019-03-14 DIAGNOSIS — Z923 Personal history of irradiation: Secondary | ICD-10-CM | POA: Diagnosis not present

## 2019-03-14 DIAGNOSIS — Z7982 Long term (current) use of aspirin: Secondary | ICD-10-CM | POA: Diagnosis not present

## 2019-03-14 DIAGNOSIS — Z170 Estrogen receptor positive status [ER+]: Secondary | ICD-10-CM | POA: Diagnosis not present

## 2019-03-14 DIAGNOSIS — Z79899 Other long term (current) drug therapy: Secondary | ICD-10-CM | POA: Diagnosis not present

## 2019-03-14 DIAGNOSIS — Z79810 Long term (current) use of selective estrogen receptor modulators (SERMs): Secondary | ICD-10-CM | POA: Diagnosis not present

## 2019-03-14 DIAGNOSIS — C50412 Malignant neoplasm of upper-outer quadrant of left female breast: Secondary | ICD-10-CM

## 2019-03-14 DIAGNOSIS — Z87891 Personal history of nicotine dependence: Secondary | ICD-10-CM

## 2019-03-14 NOTE — Progress Notes (Signed)
Patient denies any concerns today.  

## 2019-03-16 ENCOUNTER — Ambulatory Visit: Payer: Medicare Other

## 2019-03-24 ENCOUNTER — Other Ambulatory Visit: Payer: Self-pay

## 2019-03-24 ENCOUNTER — Encounter
Admission: RE | Admit: 2019-03-24 | Discharge: 2019-03-24 | Disposition: A | Discharge status: Home / Self Care | Payer: Medicare Other | Source: Ambulatory Visit | Attending: Cardiology | Admitting: Cardiology

## 2019-03-24 DIAGNOSIS — Z01812 Encounter for preprocedural laboratory examination: Secondary | ICD-10-CM | POA: Diagnosis not present

## 2019-03-24 DIAGNOSIS — Z1159 Encounter for screening for other viral diseases: Secondary | ICD-10-CM | POA: Diagnosis not present

## 2019-03-24 LAB — BASIC METABOLIC PANEL
Anion gap: 8 (ref 5–15)
BUN: 15 mg/dL (ref 8–23)
CO2: 22 mmol/L (ref 22–32)
Calcium: 9.8 mg/dL (ref 8.9–10.3)
Chloride: 107 mmol/L (ref 98–111)
Creatinine, Ser: 0.61 mg/dL (ref 0.44–1.00)
GFR calc Af Amer: >60 mL/min (ref >60)
GFR calc non Af Amer: >60 mL/min (ref >60)
Glucose, Bld: 96 mg/dL (ref 70–99)
Potassium: 4.3 mmol/L (ref 3.5–5.1)
Sodium: 137 mmol/L (ref 135–145)

## 2019-03-24 LAB — CBC
HCT: 38.8 % (ref 36.0–46.0)
Hemoglobin: 12.1 g/dL (ref 12.0–15.0)
MCH: 29.1 pg (ref 26.0–34.0)
MCHC: 31.2 g/dL (ref 30.0–36.0)
MCV: 93.3 fL (ref 80.0–100.0)
Platelets: 170 10*3/uL (ref 150–400)
RBC: 4.16 MIL/uL (ref 3.87–5.11)
RDW: 14.9 % (ref 11.5–15.5)
WBC: 5.5 10*3/uL (ref 4.0–10.5)
nRBC: 0 % (ref 0.0–0.2)

## 2019-03-24 NOTE — Patient Instructions (Signed)
Your procedure is scheduled on: Wednesday 03/29/19 Report to [place] [facility]. To find out your arrival time please call [phone] between 1PM - 3PM on Tuesday 03/28/19.  Remember: Instructions that are not followed completely may result in serious medical risk, up to and including death, or upon the discretion of your surgeon and anesthesiologist your surgery may need to be rescheduled.     _X__ 1. Do not eat food after midnight the night before your procedure.                 No gum chewing or hard candies. You may drink clear liquids up to 2 hours                 before you are scheduled to arrive for your surgery- DO not drink clear                 liquids within 2 hours of the start of your surgery.                 Clear Liquids include:  water, apple juice without pulp, clear carbohydrate                 drink such as Clearfast or Gatorade, Black Coffee or Tea (Do not add                 anything to coffee or tea). Diabetics water only  __X__2.  On the morning of surgery brush your teeth with toothpaste and water, you                 may rinse your mouth with mouthwash if you wish.  Do not swallow any              toothpaste of mouthwash.     _X__ 3.  No Alcohol for 24 hours before or after surgery.   _X__ 4.  Do Not Smoke or use e-cigarettes For 24 Hours Prior to Your Surgery.                 Do not use any chewable tobacco products for at least 6 hours prior to                 surgery.  ____  5.  Bring all medications with you on the day of surgery if instructed.   __X__  6.  Notify your doctor if there is any change in your medical condition      (cold, fever, infections).     Do not wear jewelry, make-up, hairpins, clips or nail polish. Do not wear lotions, powders, or perfumes.  Do not shave 48 hours prior to surgery. Men may shave face and neck. Do not bring valuables to the hospital.    [facility] is not responsible for  any belongings or valuables.  Contacts, dentures/partials or body piercings may not be worn into surgery. Bring a case for your contacts, glasses or hearing aids, a denture cup will be supplied. Leave your suitcase in the car. After surgery it may be brought to your room. For patients admitted to the hospital, discharge time is determined by your treatment team.   Patients discharged the day of surgery will not be allowed to drive home.   Please read over the following fact sheets that you were given:   MRSA Information  __X__ Take these medicines the morning of surgery with A SIP OF WATER:  1. metoprolol (LOPRESSOR)   2.   3.   4.  5.  6.  ____ Fleet Enema (as directed)   __X__ Use CHG Soap/SAGE wipes as directed  ____ Use inhalers on the day of surgery  ____ Stop metformin/Janumet/Farxiga 2 days prior to surgery    ____ Take 1/2 of usual insulin dose the night before surgery. No insulin the morning          of surgery.   ____ Stop Blood Thinners Coumadin/Plavix/Xarelto/Pleta/Pradaxa/Eliquis/Effient/Aspirin  on   Or contact your Surgeon, Cardiologist or Medical Doctor regarding  ability to stop your blood thinners  __X__ Stop Anti-inflammatories 7 days before surgery such as Advil, Ibuprofen, Motrin,  BC or Goodies Powder, Naprosyn, Naproxen, Aleve, Aspirin    __X__ Stop all herbal supplements, fish oil or vitamin E until after surgery.    ____ Bring C-Pap to the hospital.

## 2019-03-24 NOTE — Pre-Procedure Instructions (Signed)
   ECG 12-lead6/19/2020 [place] Component Name Value Ref Range  Vent Rate (bpm) 77   QRS Interval (msec) 90   QT Interval (msec) 374   QTc (msec) 423   Other Result Information  This result has an attachment that is not available.  Result Narrative  Atrial fibrillation with occasional ventricular-paced complexes and with premature ventricular or aberrantly conducted complexes   When compared with ECG of 18-Oct-2017 15:17, Electronic ventricular pacemaker has replaced Atrial fibrillation I reviewed and concur with this report. Electronically signed IL:[id number] MD, KEN ([id number]) on 03/08/2019 8:08:44 AM

## 2019-03-25 LAB — SARS CORONAVIRUS 2 (TAT 6-24 HRS): SARS Coronavirus 2: NEGATIVE

## 2019-03-29 ENCOUNTER — Ambulatory Visit: Payer: Medicare Other | Admitting: Certified Registered"

## 2019-03-29 ENCOUNTER — Ambulatory Visit
Admission: RE | Admit: 2019-03-29 | Discharge: 2019-03-29 | Disposition: A | Discharge status: Home / Self Care | Payer: Medicare Other | Source: Ambulatory Visit | Attending: Cardiology | Admitting: Cardiology

## 2019-03-29 ENCOUNTER — Other Ambulatory Visit: Payer: Self-pay

## 2019-03-29 ENCOUNTER — Encounter
Admission: RE | Disposition: A | Discharge status: Home / Self Care | Payer: Self-pay | Source: Ambulatory Visit | Attending: Cardiology

## 2019-03-29 DIAGNOSIS — Z9849 Cataract extraction status, unspecified eye: Secondary | ICD-10-CM | POA: Insufficient documentation

## 2019-03-29 DIAGNOSIS — Z801 Family history of malignant neoplasm of trachea, bronchus and lung: Secondary | ICD-10-CM | POA: Diagnosis not present

## 2019-03-29 DIAGNOSIS — R001 Bradycardia, unspecified: Secondary | ICD-10-CM | POA: Diagnosis not present

## 2019-03-29 DIAGNOSIS — H3530 Unspecified macular degeneration: Secondary | ICD-10-CM | POA: Insufficient documentation

## 2019-03-29 DIAGNOSIS — Z882 Allergy status to sulfonamides status: Secondary | ICD-10-CM | POA: Diagnosis not present

## 2019-03-29 DIAGNOSIS — Z881 Allergy status to other antibiotic agents status: Secondary | ICD-10-CM | POA: Insufficient documentation

## 2019-03-29 DIAGNOSIS — E1140 Type 2 diabetes mellitus with diabetic neuropathy, unspecified: Secondary | ICD-10-CM | POA: Diagnosis not present

## 2019-03-29 DIAGNOSIS — Z7982 Long term (current) use of aspirin: Secondary | ICD-10-CM | POA: Diagnosis not present

## 2019-03-29 DIAGNOSIS — I495 Sick sinus syndrome: Secondary | ICD-10-CM | POA: Diagnosis not present

## 2019-03-29 DIAGNOSIS — E7800 Pure hypercholesterolemia, unspecified: Secondary | ICD-10-CM | POA: Diagnosis not present

## 2019-03-29 DIAGNOSIS — Z8249 Family history of ischemic heart disease and other diseases of the circulatory system: Secondary | ICD-10-CM | POA: Insufficient documentation

## 2019-03-29 DIAGNOSIS — I480 Paroxysmal atrial fibrillation: Secondary | ICD-10-CM | POA: Insufficient documentation

## 2019-03-29 DIAGNOSIS — Z888 Allergy status to other drugs, medicaments and biological substances status: Secondary | ICD-10-CM | POA: Diagnosis not present

## 2019-03-29 DIAGNOSIS — Z87891 Personal history of nicotine dependence: Secondary | ICD-10-CM | POA: Insufficient documentation

## 2019-03-29 DIAGNOSIS — Z7984 Long term (current) use of oral hypoglycemic drugs: Secondary | ICD-10-CM | POA: Insufficient documentation

## 2019-03-29 DIAGNOSIS — Z823 Family history of stroke: Secondary | ICD-10-CM | POA: Insufficient documentation

## 2019-03-29 DIAGNOSIS — Z79899 Other long term (current) drug therapy: Secondary | ICD-10-CM | POA: Insufficient documentation

## 2019-03-29 DIAGNOSIS — I341 Nonrheumatic mitral (valve) prolapse: Secondary | ICD-10-CM | POA: Insufficient documentation

## 2019-03-29 DIAGNOSIS — D649 Anemia, unspecified: Secondary | ICD-10-CM | POA: Insufficient documentation

## 2019-03-29 DIAGNOSIS — Z8673 Personal history of transient ischemic attack (TIA), and cerebral infarction without residual deficits: Secondary | ICD-10-CM | POA: Insufficient documentation

## 2019-03-29 DIAGNOSIS — Z841 Family history of disorders of kidney and ureter: Secondary | ICD-10-CM | POA: Insufficient documentation

## 2019-03-29 DIAGNOSIS — I4811 Longstanding persistent atrial fibrillation: Secondary | ICD-10-CM | POA: Diagnosis not present

## 2019-03-29 DIAGNOSIS — Z8379 Family history of other diseases of the digestive system: Secondary | ICD-10-CM | POA: Insufficient documentation

## 2019-03-29 DIAGNOSIS — I252 Old myocardial infarction: Secondary | ICD-10-CM | POA: Diagnosis not present

## 2019-03-29 DIAGNOSIS — M1990 Unspecified osteoarthritis, unspecified site: Secondary | ICD-10-CM | POA: Insufficient documentation

## 2019-03-29 DIAGNOSIS — Z833 Family history of diabetes mellitus: Secondary | ICD-10-CM | POA: Diagnosis not present

## 2019-03-29 DIAGNOSIS — I2510 Atherosclerotic heart disease of native coronary artery without angina pectoris: Secondary | ICD-10-CM | POA: Insufficient documentation

## 2019-03-29 DIAGNOSIS — Z803 Family history of malignant neoplasm of breast: Secondary | ICD-10-CM | POA: Insufficient documentation

## 2019-03-29 DIAGNOSIS — R002 Palpitations: Secondary | ICD-10-CM | POA: Diagnosis not present

## 2019-03-29 DIAGNOSIS — Z85828 Personal history of other malignant neoplasm of skin: Secondary | ICD-10-CM | POA: Insufficient documentation

## 2019-03-29 DIAGNOSIS — E785 Hyperlipidemia, unspecified: Secondary | ICD-10-CM | POA: Insufficient documentation

## 2019-03-29 DIAGNOSIS — Z8051 Family history of malignant neoplasm of kidney: Secondary | ICD-10-CM | POA: Insufficient documentation

## 2019-03-29 DIAGNOSIS — Z8042 Family history of malignant neoplasm of prostate: Secondary | ICD-10-CM | POA: Insufficient documentation

## 2019-03-29 DIAGNOSIS — Z87442 Personal history of urinary calculi: Secondary | ICD-10-CM | POA: Insufficient documentation

## 2019-03-29 DIAGNOSIS — Z820 Family history of epilepsy and other diseases of the nervous system: Secondary | ICD-10-CM | POA: Insufficient documentation

## 2019-03-29 DIAGNOSIS — Z90710 Acquired absence of both cervix and uterus: Secondary | ICD-10-CM | POA: Diagnosis not present

## 2019-03-29 DIAGNOSIS — Z87898 Personal history of other specified conditions: Secondary | ICD-10-CM | POA: Insufficient documentation

## 2019-03-29 DIAGNOSIS — Z950 Presence of cardiac pacemaker: Secondary | ICD-10-CM | POA: Insufficient documentation

## 2019-03-29 DIAGNOSIS — E119 Type 2 diabetes mellitus without complications: Secondary | ICD-10-CM | POA: Diagnosis not present

## 2019-03-29 DIAGNOSIS — Z923 Personal history of irradiation: Secondary | ICD-10-CM | POA: Insufficient documentation

## 2019-03-29 HISTORY — PX: IMPLANTABLE CARDIOVERTER DEFIBRILLATOR (ICD) GENERATOR CHANGE: SHX5469

## 2019-03-29 LAB — GLUCOSE, CAPILLARY
Glucose-Capillary: 118 mg/dL — ABNORMAL HIGH (ref 70–99)
Glucose-Capillary: 101 mg/dL — ABNORMAL HIGH (ref 70–99)

## 2019-03-29 SURGERY — ICD GENERATOR CHANGE
Anesthesia: General | Laterality: Left

## 2019-03-29 MED ORDER — PROPOFOL 500 MG/50ML IV EMUL
INTRAVENOUS | Status: DC | PRN
  Administered 2019-03-29: 75 ug/kg/min via INTRAVENOUS

## 2019-03-29 MED ORDER — CEPHALEXIN 250 MG PO CAPS: 500.0000 mg | ORAL_CAPSULE | Freq: Two times a day (BID) | ORAL | 0 refills | Status: DC

## 2019-03-29 MED ORDER — PROPOFOL 10 MG/ML IV BOLUS
INTRAVENOUS | Status: DC | PRN
  Administered 2019-03-29: 40 mg via INTRAVENOUS

## 2019-03-29 MED ORDER — FAMOTIDINE 20 MG PO TABS
ORAL_TABLET | ORAL | Status: AC
  Filled 2019-03-29: qty 1

## 2019-03-29 MED ORDER — ONDANSETRON HCL 4 MG/2ML IJ SOLN: 4.0000 mg | Freq: Once | INTRAMUSCULAR | Status: DC | PRN

## 2019-03-29 MED ORDER — CEFAZOLIN SODIUM-DEXTROSE 1-4 GM/50ML-% IV SOLN
INTRAVENOUS | Status: AC
  Filled 2019-03-29: qty 50

## 2019-03-29 MED ORDER — CEFAZOLIN SODIUM-DEXTROSE 1-4 GM/50ML-% IV SOLN
1.0000 g | Freq: Once | INTRAVENOUS | Status: AC
  Administered 2019-03-29: 12:00:00 1 g via INTRAVENOUS

## 2019-03-29 MED ORDER — LIDOCAINE 1 % OPTIME INJ - NO CHARGE
INTRAMUSCULAR | Status: DC | PRN
  Administered 2019-03-29: 23 mL

## 2019-03-29 MED ORDER — FENTANYL CITRATE (PF) 100 MCG/2ML IJ SOLN: 25.0000 ug | INTRAMUSCULAR | Status: DC | PRN

## 2019-03-29 MED ORDER — PHENYLEPHRINE HCL (PRESSORS) 10 MG/ML IV SOLN
INTRAVENOUS | Status: DC | PRN
  Administered 2019-03-29 (×2): 100 ug via INTRAVENOUS

## 2019-03-29 MED ORDER — FAMOTIDINE 20 MG PO TABS
20.0000 mg | ORAL_TABLET | Freq: Once | ORAL | Status: AC
  Administered 2019-03-29: 20 mg via ORAL

## 2019-03-29 MED ORDER — SODIUM CHLORIDE 0.9 % IV SOLN
INTRAVENOUS | Status: DC
  Administered 2019-03-29: 11:00:00 via INTRAVENOUS

## 2019-03-29 MED ORDER — SODIUM CHLORIDE 0.9 % IV SOLN
Freq: Once | INTRAVENOUS | Status: DC
  Filled 2019-03-29: qty 2

## 2019-03-29 MED ORDER — SODIUM CHLORIDE 0.9 % IV SOLN
INTRAVENOUS | Status: DC | PRN
  Administered 2019-03-29: 12:00:00 40 ug/min via INTRAVENOUS

## 2019-03-29 SURGICAL SUPPLY — 24 items
BAG DECANTER FOR FLEXI CONT (MISCELLANEOUS) ×3 IMPLANT
BLADE PHOTON ILLUMINATED (MISCELLANEOUS) ×3 IMPLANT
CANISTER SUCT 1200ML W/VALVE (MISCELLANEOUS) ×3 IMPLANT
CHLORAPREP W/TINT 26 (MISCELLANEOUS) ×3 IMPLANT
COVER LIGHT HANDLE STERIS (MISCELLANEOUS) ×6 IMPLANT
COVER MAYO STAND STRL (DRAPES) ×3 IMPLANT
COVER WAND RF STERILE (DRAPES) ×3 IMPLANT
DRSG TEGADERM 4X4.75 (GAUZE/BANDAGES/DRESSINGS) ×3 IMPLANT
DRSG TELFA 4X3 1S NADH ST (GAUZE/BANDAGES/DRESSINGS) ×3 IMPLANT
ELECT REM PT RETURN 9FT ADLT (ELECTROSURGICAL) ×3
ELECTRODE REM PT RTRN 9FT ADLT (ELECTROSURGICAL) ×1 IMPLANT
GLOVE BIO SURGEON STRL SZ7.5 (GLOVE) ×3 IMPLANT
GLOVE BIO SURGEON STRL SZ8 (GLOVE) ×3 IMPLANT
GOWN STRL REUS W/ TWL LRG LVL3 (GOWN DISPOSABLE) ×1 IMPLANT
GOWN STRL REUS W/ TWL XL LVL3 (GOWN DISPOSABLE) ×1 IMPLANT
GOWN STRL REUS W/TWL LRG LVL3 (GOWN DISPOSABLE) ×2
GOWN STRL REUS W/TWL XL LVL3 (GOWN DISPOSABLE) ×2
IPG PACE AZUR XT SR MRI W1SR01 (Pacemaker) ×1 IMPLANT
KIT TURNOVER KIT A (KITS) ×3 IMPLANT
MARKER SKIN DUAL TIP RULER LAB (MISCELLANEOUS) ×3 IMPLANT
PACE AZURE XT SR MRI W1SR01 (Pacemaker) ×3 IMPLANT
PACK PACE INSERTION (MISCELLANEOUS) ×3 IMPLANT
PAD STATPAD (MISCELLANEOUS) ×3 IMPLANT
STRAP SAFETY 5IN WIDE (MISCELLANEOUS) ×3 IMPLANT

## 2019-03-29 NOTE — Transfer of Care (Signed)
Immediate Anesthesia Transfer of Care Note  Patient: Holly Rose  Procedure(s) Performed: PACEMAKER CHANGE OUT (Left )  Patient Location: PACU  Anesthesia Type:MAC  Level of Consciousness: awake, alert  and patient cooperative  Airway & Oxygen Therapy: Patient Spontanous Breathing and Patient connected to nasal cannula oxygen  Post-op Assessment: Report given to RN and Post -op Vital signs reviewed and stable  Post vital signs: Reviewed and stable  Last Vitals:  Vitals Value Taken Time  BP 118/74 03/29/19 1256  Temp 36.2 C 03/29/19 1256  Pulse 82 03/29/19 1303  Resp 25 03/29/19 1303  SpO2 97 % 03/29/19 1303  Vitals shown include unvalidated device data.  Last Pain:  Vitals:   03/29/19 1256  TempSrc:   PainSc: 0-No pain         Complications: No apparent anesthesia complications

## 2019-03-29 NOTE — Op Note (Signed)
[facility]   03/29/2019                     12:53 PM  PATIENT:  Holly Rose    PRE-OPERATIVE DIAGNOSIS:  SINOATRIAL NODE DYSFUNCTION  POST-OPERATIVE DIAGNOSIS:  Same  PROCEDURE:  PACEMAKER CHANGE OUT  SURGEON:  [name], MD    ANESTHESIA:     PREOPERATIVE INDICATIONS:  Holly Rose is a  85 y.o. female with a diagnosis of SINOATRIAL NODE DYSFUNCTION who failed conservative measures and elected for surgical management.    The risks benefits and alternatives were discussed with the patient preoperatively including but not limited to the risks of infection, bleeding, cardiopulmonary complications, the need for revision surgery, among others, and the patient was willing to proceed.   OPERATIVE PROCEDURE: Patient was brought to the operating room in a fasting state.  The left pectoral region was prepped and draped in the usual sterile manner.  Anesthesia was obtained 1% lidocaine locally.  A 1 cm incision was performed the left pectoral region.  The old pacemaker generator was retrieved electrocautery and blunt dissection.  The lead was disconnected and connected to a new MRI compatible, single-chamber rate responsive pacemaker generator ( [id number] [id number] ).  The pacemaker pocket was irrigated gentamicin solution.  The new pacemaker generator was positioned into the pocket and the pocket was closed with 2-0 and 4-0 Vicryl, respectively.  Steri-Strips and a pressure dressing were applied.  Postprocedural interrogation revealed appropriate ventricular sensing and pacing thresholds.

## 2019-03-29 NOTE — OR Nursing (Signed)
Patient reports falling this am while getting out of bed.  Patient states she tripped over her walker.  Patient was able to get up with the help of her husband.  Patient denies any injuries and denies need to be evaluated in ER.  No visible injuries noted.  Anesthesia notified and will notify surgeon as well.

## 2019-03-29 NOTE — Discharge Instructions (Signed)
Remove outer bandage 03/30/2019, leave steri-strips on. May shower 03/30/2019.   AMBULATORY SURGERY  DISCHARGE INSTRUCTIONS   1) The drugs that you were given will stay in your system until tomorrow so for the next 24 hours you should not:  A) Drive an automobile B) Make any legal decisions C) Drink any alcoholic beverage   2) You may resume regular meals tomorrow.  Today it is better to start with liquids and gradually work up to solid foods.  You may eat anything you prefer, but it is better to start with liquids, then soup and crackers, and gradually work up to solid foods.   3) Please notify your doctor immediately if you have any unusual bleeding, trouble breathing, redness and pain at the surgery site, drainage, fever, or pain not relieved by medication.    4) Additional Instructions:        Please contact your physician with any problems or Same Day Surgery at [phone], Monday through Friday 6 am to 4 pm, or [place] [facility] number at [phone].

## 2019-03-29 NOTE — Interval H&P Note (Signed)
History and Physical Interval Note:  03/29/2019 11:50 AM  Holly Rose  has presented today for surgery, with the diagnosis of SINOATRIAL NODE DYSFUNCTION.  The various methods of treatment have been discussed with the patient and family. After consideration of risks, benefits and other options for treatment, the patient has consented to  Procedure(s): PACEMAKER CHANGE OUT (Left) as a surgical intervention.  The patient's history has been reviewed, patient examined, no change in status, stable for surgery.  I have reviewed the patient's chart and labs.  Questions were answered to the patient's satisfaction.     Mong [employer]

## 2019-03-29 NOTE — Anesthesia Preprocedure Evaluation (Signed)
Anesthesia Evaluation  Patient identified by MRN, date of birth, ID band Patient awake    Reviewed: Allergy & Precautions, NPO status , Patient's Chart, lab work & pertinent test results  History of Anesthesia Complications Negative for: history of anesthetic complications  Airway Mallampati: II  TM Distance: >3 FB Neck ROM: Full    Dental  (+) Upper Dentures   Pulmonary neg sleep apnea, neg COPD, former smoker,    breath sounds clear to auscultation- rhonchi (-) wheezing      Cardiovascular + CAD and + Past MI  (-) Cardiac Stents and (-) CABG + dysrhythmias Atrial Fibrillation + pacemaker  Rhythm:Regular Rate:Normal - Systolic murmurs and - Diastolic murmurs    Neuro/Psych neg Seizures TIAnegative psych ROS   GI/Hepatic negative GI ROS, Neg liver ROS,   Endo/Other  diabetes  Renal/GU Renal disease (hx of nephrolithiasis)     Musculoskeletal  (+) Arthritis ,   Abdominal (+) - obese,   Peds  Hematology  (+) anemia ,   Anesthesia Other Findings Past Medical History: No date: Allergy No date: Anemia No date: Arthritis 2004: Atrial fibrillation ([place]) 10/2017: Breast cancer, left (HCC)     Comment:  Lumpectomy and rad tx's.  No date: CAD (coronary artery disease) No date: Complication of anesthesia     Comment:  hard to wake up with general anesthesia No date: Diabetes ([place]) No date: Dysrhythmia     Comment:  A-fib 2008: Heart attack ([place]) No date: Heart murmur No date: History of kidney stones No date: History of sick sinus syndrome     Comment:  s/p pacemaker placement No date: Hyperlipidemia No date: Macular degeneration No date: Neuropathy No date: Neuropathy 06/2010: Pacemaker 11/2017: Personal history of radiation therapy     Comment:  LEFT lumpectomy  No date: Squamous cell skin cancer No date: TIA (transient ischemic attack)   Reproductive/Obstetrics                              Anesthesia Physical Anesthesia Plan  ASA: III  Anesthesia Plan: General   Post-op Pain Management:    Induction: Intravenous  PONV Risk Score and Plan: 2 and Propofol infusion  Airway Management Planned: Natural Airway  Additional Equipment:   Intra-op Plan:   Post-operative Plan:   Informed Consent: I have reviewed the patients History and Physical, chart, labs and discussed the procedure including the risks, benefits and alternatives for the proposed anesthesia with the patient or authorized representative who has indicated his/her understanding and acceptance.     Dental advisory given  Plan Discussed with: CRNA and Anesthesiologist  Anesthesia Plan Comments:         Anesthesia Quick Evaluation

## 2019-03-29 NOTE — Anesthesia Post-op Follow-up Note (Signed)
Anesthesia QCDR form completed.        

## 2019-03-29 NOTE — H&P (Signed)
Jump to Section ? Discontinued MedicationsDocument InformationECG ResultsEncounter DetailsImaging ResultsLast Filed Vital SignsOrdered PrescriptionsOrdersPatient ContactsPatient DemographicsPlan of TreatmentProceduresProgress NotesReason for [place] for VisitSocial HistoryVisit Diagnoses Holly Rose, generated on Jun. 30, 2020June 30, 2020 Printout Information  Document Contents Document Received Date Document Source Organization  Office Visit Jun. 30, 2020June 30, [place]   Patient Demographics - 85 y.o. Female; born [birth year]  Patient Address Communication Language Race / Ethnicity Marital Status  [address] [place], [place] [address] [phone] (Home) [phone] (Mobile) [email or web address] English (Preferred) White / Not Hispanic or Latino Married  Reason for Referral  Procedure (Routine) Procedure (Routine)  Status Reason Specialty Diagnoses / Procedures Referred By Contact Referred To Contact  Authorized   Diagnoses  Bradycardia    Procedures  ECG 12-lead  Sydnee Levans, MD  [place], [place] [address]  Phone: [phone]  Fax: [phone]        Reason for Visit  Reason Comments  Pacer-ICD pacer change out   Encounter Details  Date Type Department Care Team Description  03/03/2019 Office Visit [facility]  [place], [place] [address]  [phone]  Sydnee Levans, [place]  [place], [place] [address]  [phone]  [phone] (Fax)  Bradycardia (Primary Dx);  Atrial fibrillation, unspecified type (CMS-HCC);  Nonrheumatic mitral valve prolapse;  Longstanding persistent atrial fibrillation (CMS-HCC)   Social History - documented as of this encounter Tobacco Use Types Packs/Day Years Used Date  Former Smoker Cigarettes   Quit: 1970  Smokeless Tobacco: Never Used       Alcohol Use Drinks/Week oz/Week Comments  No      Sex Assigned at [employer] Date Recorded  Not on file    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     COVID-19 Exposure Response Date Recorded  In the last month, have you been in contact with someone who was confirmed or suspected to have Coronavirus / COVID-19? No / Unsure 03/03/2019 10:34 AM EDT   Last Filed Vital Signs - documented in this encounter Vital Sign Reading Time Taken Comments  Blood Pressure 130/70 03/03/2019 11:01 AM EDT   Pulse 72 03/03/2019 11:01 AM EDT   Temperature - -   Respiratory Rate 16 03/03/2019 11:01 AM EDT   Oxygen Saturation - -   Inhaled Oxygen Concentration - -   Weight 71.1 kg (156 lb 12.8 oz) 03/03/2019 11:01 AM EDT   Height 158.8 cm (5' 2.5") 03/03/2019 11:01 AM EDT   Body Mass Index 28.22 03/03/2019 11:01 AM EDT    Ordered Prescriptions - documented in this encounter Prescription Sig Dispensed Refills Start Date End Date  metoprolol tartrate (LOPRESSOR) 25 MG tablet  Take 1 tablet (25 mg total) by mouth 2 (two) times daily 180 tablet  3 03/03/2019 03/13/2019   Progress Notes - documented in this encounter Sydnee Levans, MD - 03/03/2019 10:45 AM EDT Formatting of this note might be different from the original.   Chief Complaint: Chief Complaint  Patient presents with   Pacer-ICD  pacer change out  Date of Service: 03/03/2019 Date of Birth: November 17, 1929 PCP: Alisa Graff, MD  History of Present Illness: Holly Rose is a 85 y.o.female patient who returns for follow-up visit. She has a history of atrial fibrillation, diabetes, anemia and  hyperlipidemia. She is doing fairly well although does have occasional palpitations but denies any awareness of sustained tachyarrhythmias. Her pacemaker device is at [facility] and she will need a generator change out. She denies  syncope or presyncope. Despite her advanced age remains fairly active. She is being considered for foot surgery. She has no rest or exertional cardiac symptoms. Past Medical and Surgical History  Past Medical History Past Medical History:  Diagnosis Date   A-fib (CMS-HCC)   Anemia 2005   Heart attack (CMS-HCC) 03/2007   Hyperlipidemia   Macular degeneration 10/2010   Neuropathy 2003   Type 2 diabetes mellitus (CMS-HCC) 2002   Past Surgical History She has a past surgical history that includes Appendectomy (1947); Tonsillectomy (1950); Hysterectomy (1960); Cataract extraction; Bladder surgery (1975); Transvenous Insertion Pacemaker Single Chamber Leads (06/24/10); Colonoscopy (09/12/2015); Insert / replace / remove pacemaker; bladder surgery (1995); Repair rectocele (1996); bladder reconstruction (1996); Cataract extraction (1997); Cataract extraction (1998); cardiac cath (1989); Cystourethroscopy W/Insertion Urethral Stent (11/23/2016); Aspiration Cyst Breast (Left, 03/2017); and Cystectomy (Left, 05/2017).   Medications and Allergies  Current Medications  Current Outpatient Medications  Medication Sig Dispense Refill   aflibercept (EYLEA) 2 mg/0.05 mL injection 2 mg by Intravitreal route   alendronate (FOSAMAX) 70 MG tablet Take 70 mg by mouth every 7 (seven) days Take with a full glass of water. Do not lie down for the next 30 min.   aspirin 81 MG EC tablet Take 81 mg by mouth once daily.   blood glucose diagnostic (GLUCOSE BLOOD) test strip USE ONE STRIP TO CHECK GLUCOSE THREE TIMES DAILY   cholecalciferol (VITAMIN D3) 1,000 unit tablet Take 1,000 Units by mouth once daily.   cyanocobalamin (VITAMIN B12) 1000 MCG tablet Take 1,000 mcg by mouth once daily.   FUROsemide (LASIX) 20 MG tablet Take 2 tablets (40 mg total) by mouth 2 (two) times daily. (Patient taking differently: Take 20 mg by mouth once daily.  ) 60 tablet 6   LACTOBAC NO.41/BIFIDOBACT NO.7 (PROBIOTIC-10  ORAL) Take by mouth.   magnesium oxide 200 mg magnesium Tab Take by mouth.   metFORMIN (GLUMETZA) 500 MG (MOD) ER tablet Take 1 tablet (500 mg total) by mouth daily with dinner. (Patient taking differently: Take by mouth. 2 tam and 2 pm ) 30 tablet 0   metoprolol tartrate (LOPRESSOR) 25 MG tablet Take 1 tablet (25 mg total) by mouth 2 (two) times daily 180 tablet 3   MULTIVIT-MINERALS/FERROUS FUM (MULTI VITAMIN ORAL) Take by mouth.   tamoxifen (NOLVADEX) 20 MG tablet Take by mouth   vitamins A,C,E-zinc-copper (ICAPS AREDS) 14,320-226-200 unit-mg-unit Take 1 capsule by mouth once daily.   No current facility-administered medications for this visit.   Allergies: Neurontin [gabapentin]; Pregabalin; and Sulfamethoxazole-trimethoprim  Social and Family History  Social History reports that she quit smoking about 50 years ago. Her smoking use included cigarettes. She has never used smokeless tobacco. She reports that she does not drink alcohol or use drugs.  Family History Family History  Problem Relation Age of Onset   Stroke Mother   High blood pressure (Hypertension) Mother   Diabetes Mother   Cirrhosis Father  not r/t etoh use   Diabetes Father   High blood pressure (Hypertension) Father   Kidney disease Brother   Lung cancer Brother   Kidney disease Brother   Lung cancer Brother   Stroke Maternal Grandmother   No Known Problems Maternal Grandfather   No Known Problems Paternal Grandmother   No Known Problems Paternal  Grandfather   Diabetes Son 19   Lung cancer Son   Diabetes Daughter   Glaucoma Daughter   Lung cancer Maternal Aunt   High blood pressure (Hypertension) Maternal Aunt   Stroke Maternal Aunt   Breast cancer Maternal Aunt   Kidney cancer Brother   Prostate cancer Brother   No Known Problems Brother   No Known Problems Brother   Review of Systems  Review of Systems  Constitutional: Negative for chills, diaphoresis, fever,  malaise/fatigue and weight loss.  HENT: Negative for congestion, ear discharge, hearing loss and tinnitus.  Eyes: Negative for blurred vision.  Respiratory: Negative for hemoptysis, sputum production and wheezing.  Cardiovascular: Negative for chest pain, palpitations, orthopnea, claudication and PND.  Gastrointestinal: Negative for abdominal pain, blood in stool, constipation, diarrhea, heartburn, melena, nausea and vomiting.  Genitourinary: Negative for dysuria, frequency, hematuria and urgency.  Musculoskeletal: Negative for back pain, falls, joint pain and myalgias.  Skin: Negative for itching and rash.  Neurological: Positive for weakness. Negative for dizziness, tingling, focal weakness, loss of consciousness and headaches.  Endo/Heme/Allergies: Negative for polydipsia. Does not bruise/bleed easily.  Psychiatric/Behavioral: Negative for depression, memory loss and substance abuse.    Physical Examination   Vitals: BP 130/70   Pulse 72   Resp 16   Ht 158.8 cm (5' 2.5")   Wt 71.1 kg (156 lb 12.8 oz)   LMP (LMP Unknown)   BMI 28.22 kg/m  Ht:158.8 cm (5' 2.5") Wt:71.1 kg (156 lb 12.8 oz) UYE:BXID surface area is 1.77 meters squared. Body mass index is 28.22 kg/m.  Wt Readings from Last 3 Encounters:  03/03/19 71.1 kg (156 lb 12.8 oz)  03/01/19 72.4 kg (159 lb 9.6 oz)  10/21/18 69.8 kg (153 lb 14.1 oz)   BP Readings from Last 3 Encounters:  03/03/19 130/70  03/01/19 144/80  10/21/18 130/84   General appearance appears in no acute distress  Head Mouth and Eye exam Normocephalic, without obvious abnormality, atraumatic Dentition is good Eyes appear anicteric   LUNGS Breath Sounds: Normal Percussion: Normal  CARDIOVASCULAR JVP CV wave: no HJR: no Elevation at 90 degrees: None Carotid Pulse: normal pulsation bilaterally Bruit: None Apex: apical impulse normal  Auscultation Rhythm: atrial fibrillation and normal pacemaker rhythm S1: normal S2: normal Clicks:  no Rub: no Murmurs: 1/6 medium pitched mid systolic blowing at lower left sternal border  Gallop: None ABDOMEN Liver enlargement: no Pulsatile aorta: no Ascites: no Bruits: no  EXTREMITIES Clubbing: no Edema: trace to 1+ bilateral pedal edema Pulses: peripheral pulses symmetrical Femoral Bruits: no Amputation: yes SKIN Rash: no Cyanosis: no Embolic phemonenon: no Bruising: no NEURO Alert and Oriented to person, place and time: yes Non focal: yes  PSYCH: Pt appears to have normal affect  Assessment and Plan   85 y.o. female with  ICD-10-CM ICD-9-CM  1. Paroxysmal a-fib-atrial fibrillation is currently present. She is not currently anticoagulated due to fall and bleeding risk. Will continue with rate control and continue to avoid chronic anticoagulation due to bleeding risk. Her chads 2 vasc score is 5. I48.0 427.31  2. Persistent atrial fibrillation the-as per above. Will continue with Lasix at 40 mg daily I48.1 427.31  3. Mixed hyperlipidemia-diet control is recommended. LDL goal of less than 100. E78.2 272.2  4. Type 2 diabetes mellitus without complication-Will continue with metformin and glimepiride. Hemoglobin A1c goal of less than 6. E11.9 250.00  5. Bradycardia-VVI backup pacing. Patient is at [facility]. Will schedule generator change up.  6. Appears to  be optimized from a cardiac standpoint for surgery if needed.  Return in about 4 weeks (around 03/31/2019).  These notes generated with voice recognition software. I apologize for typographical errors.  Sydnee Levans, MD    Electronically signed by Sydnee Levans, MD at 03/06/2019 8:25 AM EDT   Plan of Treatment - documented as of this encounter Upcoming Encounters Upcoming Encounters  Date Type Specialty Care Team Description  04/27/2019 Office Visit Cardiology [place], Aloha Gell, MD  [address]  [place], [place] [address]  [phone]  [phone] (Fax)    05/23/2019 Procedure visit  Cardiology [place], Aloha Gell, MD  [place], [place] [address]  [phone]  [phone] (Fax)     Procedures - documented in this encounter Procedure Name Priority Date/Time Associated Diagnosis Comments  PR ELECTROCARDIOGRAM, COMPLETE Routine 03/03/2019 11:10 AM EDT Bradycardia  Results for this procedure are in the results section.    Imaging Results - documented in this encounter  X-ray chest PA and lateral (03/03/2019 11:57 AM EDT) X-ray chest PA and lateral (03/03/2019 11:57 AM EDT)  Specimen     X-ray chest PA and lateral (03/03/2019 11:57 AM EDT)  Narrative Performed At  This result has an attachment that is not available.        ECG Results - documented in this encounter  ECG 12-lead (03/03/2019 11:10 AM EDT) ECG 12-lead (03/03/2019 11:10 AM EDT)  Component Value Ref Range Performed At Pathologist Signature  Vent Rate (bpm) 77  [facility] GE MUSE RESULTS   QRS Interval (msec) 90  [facility] GE MUSE RESULTS   QT Interval (msec) 374  [facility] GE MUSE RESULTS   QTc (msec) 423  [facility] GE MUSE RESULTS    ECG 12-lead (03/03/2019 11:10 AM EDT)  Specimen     ECG 12-lead (03/03/2019 11:10 AM EDT)  Narrative Performed At  This result has an attachment that is not available.  Atrial fibrillation with occasional ventricular-paced complexes and with premature ventricular or aberrantly conducted complexes   When compared with ECG of 18-Oct-2017 15:17, Electronic ventricular pacemaker has replaced Atrial fibrillation I reviewed and concur with this report. Electronically signed DG:UYQI MD, KEN [phone]) on 03/08/2019 8:08:44 AM   [facility] GE MUSE RESULTS    ECG 12-lead (03/03/2019 11:10 AM EDT)  Performing Organization Address City/State/Zipcode Phone Number  [place]        Visit Diagnoses - documented in this encounter Diagnosis  Bradycardia - Primary  Other specified cardiac dysrhythmias   Atrial fibrillation, unspecified type  (CMS-HCC)   Nonrheumatic mitral valve prolapse   Longstanding persistent atrial fibrillation (CMS-HCC)    Discontinued Medications - documented as of this encounter Medication Sig Discontinue Reason Start Date End Date  metoprolol tartrate (LOPRESSOR) 25 MG tablet  Take 0.5 tablets (12.5 mg total) by mouth 2 (two) times daily Reorder 07/29/2018 03/03/2019  Orders - documented in this encounter Lab Orders Without Results Count Last Ordered Date First Ordered Date  BASIC METABOLIC PANEL (BMP) - [facility], [place] 1 03/03/2019   CBC W/AUTO DIFFERENTIAL (5 PART DIFF) -[facility], [place] 1 03/03/2019   PROTHROMBIN TIME (INR) - [facility], [place] 1 03/03/2019   Images  Patient Contacts  Contact Name Contact Address Communication Relationship to Patient  Zanyah Lentsch Unknown [phone] [facility]) Son or Daughter, Emergency Contact  Document Information  Primary Care Provider Other Service Providers Document Coverage Dates  Alisa Graff, MD (Jul. 21, 2016July 21, 2016 - Present) DM: [phone] (Work)  [phone] [facility]) [address] [place], [place] [address] [place], [place] [address] Alisa Graff, MD DM: [phone] (Work) [phone] [facility]) [address] [place], [place] [address] Internal Medicine [facility] [address] [place], [place] [address] Jun. 19, 2020June 19, [place] [address] [place], [place] [address]   Encounter Providers Encounter Date  Sydnee Levans, MD (Attending) DM: [phone] (Work) [phone] (Fax) [address] [place], [place] [address] Cardiovascular Disease Jun. 19, 2020June 19, 2020    Show All Sections

## 2019-03-29 NOTE — Anesthesia Postprocedure Evaluation (Signed)
Anesthesia Post Note  Patient: Holly Rose  Procedure(s) Performed: PACEMAKER CHANGE OUT (Left )  Patient location during evaluation: PACU Anesthesia Type: General Level of consciousness: awake and alert and oriented Pain management: pain level controlled Vital Signs Assessment: post-procedure vital signs reviewed and stable Respiratory status: spontaneous breathing, nonlabored ventilation and respiratory function stable Cardiovascular status: blood pressure returned to baseline and stable Postop Assessment: no signs of nausea or vomiting Anesthetic complications: no     Last Vitals:  Vitals:   03/29/19 1356 03/29/19 1357  BP: (!) 150/87 (!) 150/87  Pulse: 72   Resp: 15   Temp:  36.4 C  SpO2: 97%     Last Pain:  Vitals:   03/29/19 1357  TempSrc:   PainSc: 0-No pain                 Shanon Seawright

## 2019-03-30 ENCOUNTER — Encounter: Payer: Self-pay | Admitting: Cardiology

## 2019-04-03 ENCOUNTER — Encounter: Payer: Self-pay | Admitting: Cardiology

## 2019-04-03 DIAGNOSIS — H353221 Exudative age-related macular degeneration, left eye, with active choroidal neovascularization: Secondary | ICD-10-CM | POA: Diagnosis not present

## 2019-04-03 DIAGNOSIS — H353211 Exudative age-related macular degeneration, right eye, with active choroidal neovascularization: Secondary | ICD-10-CM | POA: Diagnosis not present

## 2019-04-04 ENCOUNTER — Encounter: Payer: Self-pay | Admitting: General Surgery

## 2019-04-05 DIAGNOSIS — I341 Nonrheumatic mitral (valve) prolapse: Secondary | ICD-10-CM | POA: Diagnosis not present

## 2019-04-05 DIAGNOSIS — E785 Hyperlipidemia, unspecified: Secondary | ICD-10-CM | POA: Diagnosis not present

## 2019-04-05 DIAGNOSIS — E1169 Type 2 diabetes mellitus with other specified complication: Secondary | ICD-10-CM | POA: Diagnosis not present

## 2019-04-05 DIAGNOSIS — I4891 Unspecified atrial fibrillation: Secondary | ICD-10-CM | POA: Diagnosis not present

## 2019-04-05 DIAGNOSIS — I4811 Longstanding persistent atrial fibrillation: Secondary | ICD-10-CM | POA: Diagnosis not present

## 2019-04-11 ENCOUNTER — Other Ambulatory Visit: Payer: Self-pay

## 2019-04-11 ENCOUNTER — Encounter: Payer: Self-pay | Admitting: Internal Medicine

## 2019-04-11 ENCOUNTER — Ambulatory Visit (INDEPENDENT_AMBULATORY_CARE_PROVIDER_SITE_OTHER): Payer: Medicare Other | Admitting: Internal Medicine

## 2019-04-11 ENCOUNTER — Telehealth: Payer: Self-pay | Admitting: *Deleted

## 2019-04-11 DIAGNOSIS — F439 Reaction to severe stress, unspecified: Secondary | ICD-10-CM

## 2019-04-11 DIAGNOSIS — I482 Chronic atrial fibrillation, unspecified: Secondary | ICD-10-CM | POA: Diagnosis not present

## 2019-04-11 DIAGNOSIS — D509 Iron deficiency anemia, unspecified: Secondary | ICD-10-CM | POA: Diagnosis not present

## 2019-04-11 DIAGNOSIS — E559 Vitamin D deficiency, unspecified: Secondary | ICD-10-CM

## 2019-04-11 DIAGNOSIS — E1140 Type 2 diabetes mellitus with diabetic neuropathy, unspecified: Secondary | ICD-10-CM

## 2019-04-11 DIAGNOSIS — Z170 Estrogen receptor positive status [ER+]: Secondary | ICD-10-CM | POA: Diagnosis not present

## 2019-04-11 DIAGNOSIS — C50412 Malignant neoplasm of upper-outer quadrant of left female breast: Secondary | ICD-10-CM

## 2019-04-11 DIAGNOSIS — E7800 Pure hypercholesterolemia, unspecified: Secondary | ICD-10-CM | POA: Diagnosis not present

## 2019-04-11 DIAGNOSIS — I2510 Atherosclerotic heart disease of native coronary artery without angina pectoris: Secondary | ICD-10-CM | POA: Diagnosis not present

## 2019-04-11 NOTE — Progress Notes (Signed)
Patient ID: Holly Rose, female   DOB: [birth year], 85 y.o.   MRN: [id number]   Virtual Visit via telephone Note  This visit type was conducted due to national recommendations for restrictions regarding the COVID-19 pandemic (e.g. social distancing).  This format is felt to be most appropriate for this patient at this time.  All issues noted in this document were discussed and addressed.  No physical exam was performed (except for noted visual exam findings with Video Visits).   I connected with Holly Rose by telephone and verified that I am speaking with the correct person using two identifiers. Location patient: home Location provider: work Persons participating in the telephone visit: patient, provider  I discussed the limitations, risks, security and privacy concerns of performing an evaluation and management service by telephone and the availability of in person appointments.  The patient expressed understanding and agreed to proceed.   Reason for visit: scheduled follow up  HPI: States she is doing relatively well.  Staying in due to covid restrictions.  No fever.  No cough, chest congestion or sob.  Tries to stay active.  No chest pain.  No acid reflux.  No abdominal pain.  Bowels moving.  03/29/19 - pacemaker change out.  Tolerated procedure well.  Continues f/u with cardiology.  Seeing oncology for her breast.  On tamoxifen.  Tolerating.  Taking fosamax - osteoporosis.  No increased heart rate or palpitations.  Sugars doing well.  Last a1c 6.6.  No low sugars.  States sugar this am 127.  Stays below 150.  Will notice an occasional runny nose with some cough.  mucinex helps.  No significant symptoms.  Handling stress.     ROS: See pertinent positives and negatives per HPI.  Past Medical History:  Diagnosis Date  . Allergy   . Anemia   . Arthritis   . Atrial fibrillation ([place]) 2004  . Breast cancer, left ([place]) 10/2017   Lumpectomy and rad tx's.   Marland Kitchen CAD (coronary artery  disease)   . Complication of anesthesia    hard to wake up with general anesthesia  . Diabetes ([place])   . Dysrhythmia    A-fib  . Heart attack ([place]) 2008  . Heart murmur   . History of kidney stones   . History of sick sinus syndrome    s/p pacemaker placement  . Hyperlipidemia   . Macular degeneration   . Neuropathy   . Neuropathy   . Pacemaker 06/2010  . Personal history of radiation therapy 11/2017   LEFT lumpectomy   . Squamous cell skin cancer   . TIA (transient ischemic attack)     Past Surgical History:  Procedure Laterality Date  . ABDOMINAL HYSTERECTOMY  1960's  . APPENDECTOMY  1947  . [place]  . BREAST BIOPSY Left 12/17/2016   SINGLE FRAGMENT OF ATYPICAL EPITHELIAL CELLS  . BREAST BIOPSY Left [facility]/08/2017   [facility]  . BREAST EXCISIONAL BIOPSY Left 01/18/1986   Benign microcalcifications, [employer].  Marland Kitchen BREAST LUMPECTOMY Left 05/26/2017   13 mm,T1c, N0; ER/ PR+, Her 2 neu negative, HIGH RISK by Mammoprint ;  Surgeon: [name], MD;  Location: [facility] ORS;  Service: General;  Laterality: Left;  . BREAST SURGERY  1988  . CARDIAC CATHETERIZATION  1989  . CATARACT EXTRACTION  1997 and 1998  . COLONOSCOPY WITH PROPOFOL N/A 09/12/2015   Procedure: COLONOSCOPY WITH PROPOFOL;  Surgeon: [name], MD;  [facility]  ENDOSCOPY;  Service: Endoscopy;  Laterality: N/A;  . IMPLANTABLE CARDIOVERTER DEFIBRILLATOR (ICD) GENERATOR CHANGE Left 03/29/2019   Procedure: PACEMAKER CHANGE OUT;  Surgeon: [name], MD;  Location: [facility] ORS;  Service: Cardiovascular;  Laterality: Left;  . KIDNEY STONE SURGERY  2018  . MOHS SURGERY  2019   forehead  . pace maker  2008  . [place]  . TONSILLECTOMY AND ADENOIDECTOMY  1950"s    Family History  Problem Relation Age of Onset  . Stroke Mother   . Diabetes Mother   . Cancer Maternal Aunt        Breast Cancer  . Breast cancer Maternal Aunt   . Arthritis Maternal Grandmother    . Cancer Maternal Aunt        Lung Cancer  . Diabetes Son   . Cancer Son   . Kidney disease Neg Hx   . Bladder Cancer Neg Hx     SOCIAL HX: reviewed.    Current Outpatient Medications:  .  Aflibercept (EYLEA) 2 MG/0.05ML SOLN, 1 each by Intravitreal route as directed. , Disp: , Rfl:  .  alendronate (FOSAMAX) 70 MG tablet, Take 70 mg by mouth once a week. , Disp: , Rfl:  .  aspirin 81 MG tablet, Take 81 mg by mouth daily., Disp: , Rfl:  .  Biotin w/ Vitamins C & E (HAIR/SKIN/NAILS PO), Take 3 tablets by mouth daily. 2 in the am, 1 in the pm, Disp: , Rfl:  .  blood glucose meter kit and supplies, Per patient please dispense One touch Ultra2 meter.Use meter and supplies three times a day to check blood sugar. ICD10: E11.40, Disp: 1 each, Rfl: 0 .  Cholecalciferol (VITAMIN D) 50 MCG (2000 UT) CAPS, Take 4,000 Units by mouth daily., Disp: , Rfl:  .  furosemide (LASIX) 20 MG tablet, Take 20 mg by mouth daily as needed for fluid. , Disp: , Rfl:  .  glucose blood (ONE TOUCH ULTRA TEST) test strip, USE 1 STRIP TO CHECK GLUCOSE THREE TIMES DAILY, Disp: 100 each, Rfl: 6 .  Magnesium 200 MG TABS, Take 200 mg by mouth 2 (two) times a day., Disp: , Rfl:  .  metFORMIN (GLUCOPHAGE-XR) 500 MG 24 hr tablet, TAKE 2 TABLETS BY MOUTH TWICE DAILY (Patient taking differently: Take 500 mg by mouth 2 (two) times daily with a meal. ), Disp: 360 tablet, Rfl: 2 .  metoprolol (LOPRESSOR) 50 MG tablet, Take 25 mg by mouth 2 (two) times daily. , Disp: , Rfl:  .  Multiple Vitamins-Minerals (PRESERVISION AREDS 2 PO), Take 1 tablet by mouth 2 (two) times daily. , Disp: , Rfl:  .  Polyethyl Glycol-Propyl Glycol (SYSTANE OP), Apply 1 drop to eye 2 (two) times daily., Disp: , Rfl:  .  Probiotic Product (PROBIOTIC DAILY PO), Take by mouth., Disp: , Rfl:  .  tamoxifen (NOLVADEX) 20 MG tablet, TAKE 1 TABLET BY MOUTH ONCE DAILY (Patient taking differently: Take 20 mg by mouth daily. ), Disp: 90 tablet, Rfl: 3 .  vitamin B-12  (CYANOCOBALAMIN) 500 MCG tablet, Take 500 mcg by mouth 2 (two) times a day., Disp: , Rfl:   EXAM:  GENERAL: alert.  Answering questions appropriately.  Sounds to be in no acute distress.    PSYCH/NEURO: pleasant and cooperative, no obvious depression or anxiety, speech and thought processing grossly intact  ASSESSMENT AND PLAN:  Discussed the following assessment and plan:  Atrial fibrillation, chronic Just had pacemaker change out.  Followed by  cardiology.  Doing well.    CAD (coronary artery disease) Followed by cardiology.  Stable.    Diabetes mellitus with neuropathy ([place]) Sugars doing well as outlined.  Continue low carb diet and exercise.  Follow met b and a1c.    Hypercholesterolemia Low cholesterol diet and exercise.  Follow lipid panel.    Iron deficiency anemia Follow cbc and ferritin.    Malignant neoplasm of upper-outer quadrant of left breast in female, estrogen receptor positive ([place]) On tamoxifen.  S/p XRT.  Mammogram 02/28/19 - birads II.    Stress Overall handling things well.  Has good support.  Follow.    Vitamin D deficiency, unspecified Follow vitamin D level.      I discussed the assessment and treatment plan with the patient. The patient was provided an opportunity to ask questions and all were answered. The patient agreed with the plan and demonstrated an understanding of the instructions.   The patient was advised to call back or seek an in-person evaluation if the symptoms worsen or if the condition fails to improve as anticipated.  I provided 28 minutes of non-face-to-face time during this encounter.   [name], MD

## 2019-04-11 NOTE — Telephone Encounter (Signed)
Patient will call us back when she know what doctor she would like to see.

## 2019-04-16 ENCOUNTER — Encounter: Payer: Self-pay | Admitting: Internal Medicine

## 2019-04-16 NOTE — Assessment & Plan Note (Signed)
Follow cbc and ferritin.  

## 2019-04-16 NOTE — Assessment & Plan Note (Signed)
Sugars doing well as outlined.  Continue low carb diet and exercise.  Follow met b and a1c.

## 2019-04-16 NOTE — Assessment & Plan Note (Signed)
Followed by cardiology. Stable.   

## 2019-04-16 NOTE — Assessment & Plan Note (Signed)
Low cholesterol diet and exercise.  Follow lipid panel.   

## 2019-04-16 NOTE — Assessment & Plan Note (Signed)
Follow vitamin D level.  

## 2019-04-16 NOTE — Assessment & Plan Note (Signed)
On tamoxifen.  S/p XRT.  Mammogram 02/28/19 - birads II.

## 2019-04-16 NOTE — Assessment & Plan Note (Signed)
Overall handling things well.  Has good support.  Follow.

## 2019-04-16 NOTE — Assessment & Plan Note (Signed)
Just had pacemaker change out.  Followed by cardiology.  Doing well.

## 2019-04-18 ENCOUNTER — Ambulatory Visit: Payer: Medicare Other | Admitting: General Surgery

## 2019-04-19 ENCOUNTER — Ambulatory Visit: Payer: Medicare Other | Admitting: Podiatry

## 2019-04-19 ENCOUNTER — Other Ambulatory Visit: Payer: Self-pay

## 2019-04-19 ENCOUNTER — Encounter: Payer: Self-pay | Admitting: Podiatry

## 2019-04-19 ENCOUNTER — Ambulatory Visit (INDEPENDENT_AMBULATORY_CARE_PROVIDER_SITE_OTHER): Payer: Medicare Other | Admitting: Podiatry

## 2019-04-19 VITALS — Temp 98.7°F

## 2019-04-19 DIAGNOSIS — Q828 Other specified congenital malformations of skin: Secondary | ICD-10-CM | POA: Diagnosis not present

## 2019-04-19 DIAGNOSIS — M79676 Pain in unspecified toe(s): Secondary | ICD-10-CM | POA: Diagnosis not present

## 2019-04-19 DIAGNOSIS — B351 Tinea unguium: Secondary | ICD-10-CM | POA: Diagnosis not present

## 2019-04-19 DIAGNOSIS — E1142 Type 2 diabetes mellitus with diabetic polyneuropathy: Secondary | ICD-10-CM | POA: Diagnosis not present

## 2019-04-19 NOTE — Progress Notes (Signed)
She presents today chief complaint of painfully elongated toenails and calluses bilaterally.  She states that she does not have any wounds.  Objective: Vital signs are stable she is alert and oriented x3 pulses are palpable.  No open lesions or wounds multiple reactive hyper keratomas plantar aspect of the bilateral foot no bleeding beneath them.  Toenails are long thick yellow dystrophic clinically mycotic bilateral foot.  Assessment: Pain in limb secondary to onychomycosis diabetic peripheral neuropathy and painful porokeratotic lesions.  Plan: Debridement of all reactive hyperkeratotic tissue debridement of toenails 1 through 5 bilateral follow-up with her in 2 months

## 2019-04-26 ENCOUNTER — Other Ambulatory Visit (INDEPENDENT_AMBULATORY_CARE_PROVIDER_SITE_OTHER): Payer: Medicare Other

## 2019-04-26 ENCOUNTER — Encounter: Payer: Self-pay | Admitting: Internal Medicine

## 2019-04-26 ENCOUNTER — Telehealth: Payer: Self-pay

## 2019-04-26 ENCOUNTER — Other Ambulatory Visit: Payer: Self-pay

## 2019-04-26 DIAGNOSIS — R300 Dysuria: Secondary | ICD-10-CM

## 2019-04-26 NOTE — Telephone Encounter (Signed)
Patients daughter in law is going to come pick urine cup up. Cup has been labeled. Cup, hat and wipes placed in cabinet up front for pick up.

## 2019-04-27 ENCOUNTER — Other Ambulatory Visit: Payer: Self-pay

## 2019-04-27 ENCOUNTER — Ambulatory Visit (INDEPENDENT_AMBULATORY_CARE_PROVIDER_SITE_OTHER): Payer: Medicare Other | Admitting: Internal Medicine

## 2019-04-27 DIAGNOSIS — R300 Dysuria: Secondary | ICD-10-CM

## 2019-04-27 DIAGNOSIS — N390 Urinary tract infection, site not specified: Secondary | ICD-10-CM

## 2019-04-27 LAB — URINALYSIS, ROUTINE W REFLEX MICROSCOPIC

## 2019-04-27 MED ORDER — CEFDINIR 300 MG PO CAPS: 300.0000 mg | ORAL_CAPSULE | Freq: Two times a day (BID) | ORAL | 0 refills | Status: DC

## 2019-04-27 NOTE — Progress Notes (Signed)
Patient ID: Holly Rose, female   DOB: September 02, 1930, 85 y.o.   MRN: [id number]   Virtual Visit via tleephone Note  This visit type was conducted due to national recommendations for restrictions regarding the COVID-19 pandemic (e.g. social distancing).  This format is felt to be most appropriate for this patient at this time.  All issues noted in this document were discussed and addressed.  No physical exam was performed (except for noted visual exam findings with Video Visits).   I connected with Holly Rose by telephone and verified that I am speaking with the correct person using two identifiers. Location patient: home Location provider: work Persons participating in the telephohne visit: patient, provider  I discussed the limitations, risks, security and privacy concerns of performing an evaluation and management service by telephone and the availability of in person appointments.  The patient expressed understanding and agreed to proceed.   Reason for visit: acute visit  HPI: She reports that she started having symptoms about 10 days ago.  Reports increased dysuria.  No hematuria.  Increased frequency.  No fever.  No vomiting or diarrhea.  Trying to stay hydrated.  States when she gets uti, will notice mucus - stool (with some red blood with wiping).  Discussed further w/up.  She declines.  Taking AZO.  Reviewed.  She has had several UTIs this year.  Previously saw Dr Jacqlyn Larsen.  H/o stent placement.  Request f/u with urology here in town.  Eating and drinking.     ROS: See pertinent positives and negatives per HPI.  Past Medical History:  Diagnosis Date  . Allergy   . Anemia   . Arthritis   . Atrial fibrillation ([place]) 2004  . Breast cancer, left ([place]) 10/2017   Lumpectomy and rad tx's.   Marland Kitchen CAD (coronary artery disease)   . Complication of anesthesia    hard to wake up with general anesthesia  . Diabetes ([place])   . Dysrhythmia    A-fib  . Heart attack ([place]) 2008  . Heart  murmur   . History of kidney stones   . History of sick sinus syndrome    s/p pacemaker placement  . Hyperlipidemia   . Macular degeneration   . Neuropathy   . Neuropathy   . Pacemaker 06/2010  . Personal history of radiation therapy 11/2017   LEFT lumpectomy   . Squamous cell skin cancer   . TIA (transient ischemic attack)     Past Surgical History:  Procedure Laterality Date  . ABDOMINAL HYSTERECTOMY  1960's  . APPENDECTOMY  1947  . [place]  . BREAST BIOPSY Left 12/17/2016   SINGLE FRAGMENT OF ATYPICAL EPITHELIAL CELLS  . [facility]/2017   [facility]  . BREAST EXCISIONAL BIOPSY Left 01/18/1986   Benign microcalcifications, [employer].  Marland Kitchen BREAST LUMPECTOMY Left 05/26/2017   13 mm,T1c, N0; ER/ PR+, Her 2 neu negative, HIGH RISK by Mammoprint ;  Surgeon: [name], MD;  Location: [facility] ORS;  Service: General;  Laterality: Left;  . BREAST SURGERY  1988  . CARDIAC CATHETERIZATION  1989  . CATARACT EXTRACTION  1997 and 1998  . COLONOSCOPY WITH PROPOFOL N/A 09/12/2015   Procedure: COLONOSCOPY WITH PROPOFOL;  Surgeon: [name], MD;  [facility];  Service: Endoscopy;  Laterality: N/A;  . IMPLANTABLE CARDIOVERTER DEFIBRILLATOR (ICD) GENERATOR CHANGE Left 03/29/2019   Procedure: PACEMAKER CHANGE OUT;  Surgeon: [name], MD;  Location: [facility] ORS;  Service: Cardiovascular;  Laterality: Left;  . KIDNEY STONE SURGERY  2018  . MOHS SURGERY  2019   forehead  . pace maker  2008  . [place]  . TONSILLECTOMY AND ADENOIDECTOMY  1950"s    Family History  Problem Relation Age of Onset  . Stroke Mother   . Diabetes Mother   . Cancer Maternal Aunt        Breast Cancer  . Breast cancer Maternal Aunt   . Arthritis Maternal Grandmother   . Cancer Maternal Aunt        Lung Cancer  . Diabetes Son   . Cancer Son   . Kidney disease Neg Hx   . Bladder Cancer Neg Hx     SOCIAL HX: reviewed.      Current Outpatient Medications:  .  Aflibercept (EYLEA) 2 MG/0.05ML SOLN, 1 each by Intravitreal route as directed. , Disp: , Rfl:  .  alendronate (FOSAMAX) 70 MG tablet, Take 70 mg by mouth once a week. , Disp: , Rfl:  .  aspirin 81 MG tablet, Take 81 mg by mouth daily., Disp: , Rfl:  .  Biotin w/ Vitamins C & E (HAIR/SKIN/NAILS PO), Take 3 tablets by mouth daily. 2 in the am, 1 in the pm, Disp: , Rfl:  .  blood glucose meter kit and supplies, Per patient please dispense One touch Ultra2 meter.Use meter and supplies three times a day to check blood sugar. ICD10: E11.40, Disp: 1 each, Rfl: 0 .  cefdinir (OMNICEF) 300 MG capsule, Take 1 capsule (300 mg total) by mouth 2 (two) times daily., Disp: 10 capsule, Rfl: 0 .  Cholecalciferol (VITAMIN D) 50 MCG (2000 UT) CAPS, Take 4,000 Units by mouth daily., Disp: , Rfl:  .  furosemide (LASIX) 20 MG tablet, Take 20 mg by mouth daily as needed for fluid. , Disp: , Rfl:  .  glucose blood (ONE TOUCH ULTRA TEST) test strip, USE 1 STRIP TO CHECK GLUCOSE THREE TIMES DAILY, Disp: 100 each, Rfl: 6 .  Magnesium 200 MG TABS, Take 200 mg by mouth 2 (two) times a day., Disp: , Rfl:  .  metFORMIN (GLUCOPHAGE-XR) 500 MG 24 hr tablet, TAKE 2 TABLETS BY MOUTH TWICE DAILY (Patient taking differently: Take 500 mg by mouth 2 (two) times daily with a meal. ), Disp: 360 tablet, Rfl: 2 .  metoprolol (LOPRESSOR) 50 MG tablet, Take 25 mg by mouth 2 (two) times daily. , Disp: , Rfl:  .  Multiple Vitamins-Minerals (PRESERVISION AREDS 2 PO), Take 1 tablet by mouth 2 (two) times daily. , Disp: , Rfl:  .  Polyethyl Glycol-Propyl Glycol (SYSTANE OP), Apply 1 drop to eye 2 (two) times daily., Disp: , Rfl:  .  Probiotic Product (PROBIOTIC DAILY PO), Take by mouth., Disp: , Rfl:  .  tamoxifen (NOLVADEX) 20 MG tablet, TAKE 1 TABLET BY MOUTH ONCE DAILY (Patient taking differently: Take 20 mg by mouth daily. ), Disp: 90 tablet, Rfl: 3 .  vitamin B-12 (CYANOCOBALAMIN) 500 MCG tablet, Take 500  mcg by mouth 2 (two) times a day., Disp: , Rfl:   EXAM:  GENERAL: alert.  Answering questions appropriately.  Sounds to be in no acute distress.    PSYCH/NEURO: pleasant and cooperative, no obvious depression or anxiety, speech and thought processing grossly intact  ASSESSMENT AND PLAN:  Discussed the following assessment and plan:  Dysuria Symptoms as outlined.  Has taken AZO.  Some color interference.  Urinalysis - many wbc's with many  bacteria.  Given symptoms and urine, will cover with abx.  Stay hydrated.  Send culture.  Probiotic as directed.  Given recurring urinary tract infections and history of stent placement, refer to urology.    Frequent urinary tract infections In reviewing, has had 3-4 urinary tract infections in the past year.  Previously saw Dr Jacqlyn Larsen.  S/p stent placement.  Refer to urology.      I discussed the assessment and treatment plan with the patient. The patient was provided an opportunity to ask questions and all were answered. The patient agreed with the plan and demonstrated an understanding of the instructions.   The patient was advised to call back or seek an in-person evaluation if the symptoms worsen or if the condition fails to improve as anticipated.  I provided 23 minutes of non-face-to-face time during this encounter.   [name], MD

## 2019-04-28 LAB — URINE CULTURE
MICRO NUMBER:: 763854
SPECIMEN QUALITY:: ADEQUATE

## 2019-04-30 ENCOUNTER — Encounter: Payer: Self-pay | Admitting: Internal Medicine

## 2019-04-30 DIAGNOSIS — R300 Dysuria: Secondary | ICD-10-CM | POA: Insufficient documentation

## 2019-04-30 DIAGNOSIS — N390 Urinary tract infection, site not specified: Secondary | ICD-10-CM | POA: Insufficient documentation

## 2019-04-30 NOTE — Assessment & Plan Note (Signed)
Symptoms as outlined.  Has taken AZO.  Some color interference.  Urinalysis - many wbc's with many bacteria.  Given symptoms and urine, will cover with abx.  Stay hydrated.  Send culture.  Probiotic as directed.  Given recurring urinary tract infections and history of stent placement, refer to urology.

## 2019-04-30 NOTE — Assessment & Plan Note (Signed)
In reviewing, has had 3-4 urinary tract infections in the past year.  Previously saw Dr Jacqlyn Larsen.  S/p stent placement.  Refer to urology.

## 2019-05-01 ENCOUNTER — Telehealth: Payer: Self-pay | Admitting: *Deleted

## 2019-05-01 NOTE — Telephone Encounter (Signed)
Copied from [place] [phone]. Topic: General - Other >> May 01, 2019  2:48 PM Holly Rose A wrote: Patient called to inform Larena Glassman that she is feeling a lot better. She no longer has any pain and is  still taking the medication prescribed.

## 2019-05-01 NOTE — Telephone Encounter (Signed)
Patient is aware and will stop abx

## 2019-05-01 NOTE — Telephone Encounter (Signed)
Noted.  Glad feeling better.  May stop abx.

## 2019-05-01 NOTE — Telephone Encounter (Signed)
Pt was advised to call with update. She did not stop the abx over the weekend. Feeling much better, pain is gone.

## 2019-05-06 ENCOUNTER — Encounter: Payer: Self-pay | Admitting: Internal Medicine

## 2019-05-08 ENCOUNTER — Telehealth: Payer: Self-pay

## 2019-05-08 ENCOUNTER — Other Ambulatory Visit: Payer: Self-pay

## 2019-05-08 MED ORDER — FLUCONAZOLE 150 MG PO TABS: 150.0000 mg | ORAL_TABLET | Freq: Every day | ORAL | 0 refills | Status: DC

## 2019-05-08 NOTE — Telephone Encounter (Signed)
Patients daughter in law aware and stated she will let us know if no improvement.

## 2019-05-08 NOTE — Telephone Encounter (Signed)
See other note routed to Dr Derrel Nip

## 2019-05-08 NOTE — Progress Notes (Unsigned)
Spoke with patients daughter in [employer]. Patient was checked for UTI and culture came back negative so patient stopped abx. Did virtual with Dr [name] to discuss. Referred to urology but does not have appt until 9/23. Patient was feeling better but is now having the itching, burning, irritation again. No urinary symptoms. No other acute symptoms. Daughter in law stated that from the way she is describing her symptoms, it sounds more like a yeast infection. Mentioned diflucan. Has taken in the past and tolerated ok. Advised Dr. [name] is out of office this week and would send over to covering provider. Are you okay with sending in diflucan for her or would you prefer to see her?

## 2019-05-08 NOTE — Telephone Encounter (Signed)
I'll treat for 2 days and if no improvement she'll need a visit .  rx sent to [facility]

## 2019-05-08 NOTE — Telephone Encounter (Signed)
Spoke with patients daughter in [employer]. Patient was checked for UTI and culture came back negative so patient stopped abx. Did virtual with Dr [name] to discuss. Referred to urology but does not have appt until 9/23. Patient was feeling better but is now having the itching, burning, irritation again. No urinary symptoms. No other acute symptoms. Daughter in law stated that from the way she is describing her symptoms, it sounds more like a yeast infection. Mentioned diflucan. Has taken in the past and tolerated ok. Advised Dr. [name] is out of office this week and would send over to covering provider. Are you okay with sending in diflucan for her or would you prefer to see her?

## 2019-05-15 DIAGNOSIS — H353211 Exudative age-related macular degeneration, right eye, with active choroidal neovascularization: Secondary | ICD-10-CM | POA: Diagnosis not present

## 2019-05-16 DIAGNOSIS — H353221 Exudative age-related macular degeneration, left eye, with active choroidal neovascularization: Secondary | ICD-10-CM | POA: Diagnosis not present

## 2019-05-23 DIAGNOSIS — I495 Sick sinus syndrome: Secondary | ICD-10-CM | POA: Diagnosis not present

## 2019-06-07 ENCOUNTER — Other Ambulatory Visit: Payer: Self-pay

## 2019-06-07 ENCOUNTER — Ambulatory Visit (INDEPENDENT_AMBULATORY_CARE_PROVIDER_SITE_OTHER): Payer: Medicare Other | Admitting: Urology

## 2019-06-07 ENCOUNTER — Encounter: Payer: Self-pay | Admitting: Urology

## 2019-06-07 VITALS — BP 142/87 | HR 80 | Ht 62.5 in | Wt 155.0 lb

## 2019-06-07 DIAGNOSIS — Z87442 Personal history of urinary calculi: Secondary | ICD-10-CM

## 2019-06-07 DIAGNOSIS — I2510 Atherosclerotic heart disease of native coronary artery without angina pectoris: Secondary | ICD-10-CM | POA: Diagnosis not present

## 2019-06-07 DIAGNOSIS — R8271 Bacteriuria: Secondary | ICD-10-CM | POA: Diagnosis not present

## 2019-06-07 DIAGNOSIS — N390 Urinary tract infection, site not specified: Secondary | ICD-10-CM | POA: Diagnosis not present

## 2019-06-07 DIAGNOSIS — N952 Postmenopausal atrophic vaginitis: Secondary | ICD-10-CM

## 2019-06-07 LAB — URINALYSIS, COMPLETE
Bilirubin, UA: NEGATIVE
Glucose, UA: NEGATIVE
Ketones, UA: NEGATIVE
Nitrite, UA: POSITIVE — AB
RBC, UA: NEGATIVE
Specific Gravity, UA: 1.025 (ref 1.005–1.030)
Urobilinogen, Ur: 0.2 mg/dL (ref 0.2–1.0)
pH, UA: 5 (ref 5.0–7.5)

## 2019-06-07 LAB — BLADDER SCAN AMB NON-IMAGING

## 2019-06-07 LAB — MICROSCOPIC EXAMINATION: RBC, Urine: NONE SEEN /HPF (ref 0–2)

## 2019-06-07 NOTE — Progress Notes (Signed)
06/07/2019 10:15 AM   Holly Rose Sep 23, 1929 [id number]  Referring provider: [name], MD [address] [place],  [place] [address]  Chief Complaint  Patient presents with  . Recurrent UTI    HPI: 85 year old female who presents today for further evaluation of recurrent urinary tract infections.  She is accompanied by daughter today.  She in fact is not a new patient today.  She was seen back in 07/2016.  Please see previous notes for details.  She is personal history of "bladder tack" in 1975 in 1995.  She had rectocele repair and bladder reconstruction 1996.  She also has a history of OAB urinary urgency and urge incontinence.  She elected to undergo PTNS and completed 6 treatments but failed to return thereafter.  It appears that she may have transferred her care to Dr. Jacqlyn Larsen.  She also has a known history of atrophic vaginitis.  She was previously being treated with topical estrogen.  She is subsequently been diagnosed and treated for left breast cancer.  She also has a personal history of a nonobstructing left lower pole kidney stone measuring 1.6 cm in 2017.  She ultimately elected to have this treated ureteroscopically by Dr. Jacqlyn Larsen.  Follow-up KUB in 2018 showed no residual stone burden.  She was referred back to [place] for recurrent urinary tract infections.  Per her primary care's note, Dr. [name] she is had 3-4 UTIs this year.    Most recent episode of "UTI"  on 04/26/2019, UA had significant color interference presumably from Pyridium.  Symptoms of the time include increased dysuria and frequency.  She also reports having mucus in her stool as a sign of UTI.  Urine culture was negative.  She was treated for antibiotics on this occasion but told to stop them after urine culture reportedly was negative.  She subsequent developed vaginal itching was treated treated for a yeast infection.  Prior to this, she was admitted for a UTI in 08/2018 at which time  she grew E. coli and Enterobacter.  Admitting presentation was vertigo.  She also grew E. coli on 05/27/2018, E. coli 9/27 2019 and 07/15/2018.  She is very self conscientious about possible vaginal odor.  She washed herself multiple times per day per her report.  She sometimes uses [place].  She also has history of vaginal irritation.  She uses vaginal cell as a lubricant as needed which often helps her discomfort.  She does take daily probiotics.  She drinks cranberry juice daily.  Today she is asymptomatic.  She denies any vaginal burning.  She does have baseline urinary urgency and occasional urge incontinence.  PMH: Past Medical History:  Diagnosis Date  . Allergy   . Anemia   . Arthritis   . Atrial fibrillation ([place]) 2004  . Breast cancer, left ([place]) 10/2017   Lumpectomy and rad tx's.   Marland Kitchen CAD (coronary artery disease)   . Complication of anesthesia    hard to wake up with general anesthesia  . Diabetes ([place])   . Dysrhythmia    A-fib  . Heart attack ([place]) 2008  . Heart murmur   . History of kidney stones   . History of sick sinus syndrome    s/p pacemaker placement  . Hyperlipidemia   . Macular degeneration   . Neuropathy   . Neuropathy   . Pacemaker 06/2010  . Personal history of radiation therapy 11/2017   LEFT lumpectomy   . Squamous cell skin cancer   .  TIA (transient ischemic attack)     Surgical History: Past Surgical History:  Procedure Laterality Date  . ABDOMINAL HYSTERECTOMY  1960's  . APPENDECTOMY  1947  . [place]  . BREAST BIOPSY Left 12/17/2016   SINGLE FRAGMENT OF ATYPICAL EPITHELIAL CELLS  . [facility]   [facility]  . BREAST EXCISIONAL BIOPSY Left 01/18/1986   Benign microcalcifications, [employer].  Marland Kitchen BREAST LUMPECTOMY Left 05/26/2017   13 mm,T1c, N0; ER/ PR+, Her 2 neu negative, HIGH RISK by Mammoprint ;  Surgeon: [name], MD;  Location: [facility] ORS;  Service: General;  Laterality:  Left;  . BREAST SURGERY  1988  . CARDIAC CATHETERIZATION  1989  . CATARACT EXTRACTION  1997 and 1998  . COLONOSCOPY WITH PROPOFOL N/A 09/12/2015   Procedure: COLONOSCOPY WITH PROPOFOL;  Surgeon: [name], MD;  Location: [facility] ENDOSCOPY;  Service: Endoscopy;  Laterality: N/A;  . IMPLANTABLE CARDIOVERTER DEFIBRILLATOR (ICD) GENERATOR CHANGE Left 03/29/2019   Procedure: PACEMAKER CHANGE OUT;  Surgeon: [name], MD;  Location: [facility] ORS;  Service: Cardiovascular;  Laterality: Left;  . KIDNEY STONE SURGERY  2018  . MOHS SURGERY  2019   forehead  . pace maker  2008  . [place]  . TONSILLECTOMY AND ADENOIDECTOMY  1950"s    Home Medications:  Allergies as of 06/07/2019      Reactions   Lyrica [pregabalin] Swelling   Sleepy, does not eat   Neurontin [gabapentin] Nausea And Vomiting, Other (See Comments)   disoriented   Bactrim [sulfamethoxazole-trimethoprim]    Made her dizziness      Medication List       Accurate as of June 07, 2019 10:15 AM. If you have any questions, ask your nurse or doctor.        alendronate 70 MG tablet Commonly known as: FOSAMAX Take 70 mg by mouth once a week.   aspirin 81 MG tablet Take 81 mg by mouth daily.   blood glucose meter kit and supplies Per patient please dispense One touch Ultra2 meter.Use meter and supplies three times a day to check blood sugar. ICD10: E11.40   Eylea 2 MG/0.05ML Soln Generic drug: Aflibercept 1 each by Intravitreal route as directed.   fluconazole 150 MG tablet Commonly known as: DIFLUCAN Take 1 tablet (150 mg total) by mouth daily.   furosemide 20 MG tablet Commonly known as: LASIX Take 20 mg by mouth daily as needed for fluid.   glucose blood test strip Commonly known as: ONE TOUCH ULTRA TEST USE 1 STRIP TO CHECK GLUCOSE THREE TIMES DAILY   HAIR/SKIN/NAILS PO Take 3 tablets by mouth daily. 2 in the am, 1 in the pm   Magnesium 200 MG Tabs Take 200 mg by mouth 2 (two)  times a day.   metFORMIN 500 MG 24 hr tablet Commonly known as: GLUCOPHAGE-XR TAKE 2 TABLETS BY MOUTH TWICE DAILY What changed:   how much to take  when to take this   metoprolol tartrate 50 MG tablet Commonly known as: LOPRESSOR Take 25 mg by mouth 2 (two) times daily.   PRESERVISION AREDS 2 PO Take 1 tablet by mouth 2 (two) times daily.   PROBIOTIC DAILY PO Take by mouth.   SYSTANE OP Apply 1 drop to eye 2 (two) times daily.   tamoxifen 20 MG tablet Commonly known as: NOLVADEX TAKE 1 TABLET BY MOUTH ONCE DAILY   vitamin B-12 500 MCG tablet Commonly known  as: CYANOCOBALAMIN Take 500 mcg by mouth 2 (two) times a day.   Vitamin D 50 MCG (2000 UT) Caps Take 4,000 Units by mouth daily.       Allergies:  Allergies  Allergen Reactions  . Lyrica [Pregabalin] Swelling    Sleepy, does not eat  . Neurontin [Gabapentin] Nausea And Vomiting and Other (See Comments)    disoriented  . Bactrim [Sulfamethoxazole-Trimethoprim]     Made her dizziness    Family History: Family History  Problem Relation Age of Onset  . Stroke Mother   . Diabetes Mother   . Cancer Maternal Aunt        Breast Cancer  . Breast cancer Maternal Aunt   . Arthritis Maternal Grandmother   . Cancer Maternal Aunt        Lung Cancer  . Diabetes Son   . Cancer Son   . Kidney disease Neg Hx   . Bladder Cancer Neg Hx     Social History:  reports that she quit smoking about 50 years ago. She quit after 5.00 years of use. She has never used smokeless tobacco. She reports that she does not drink alcohol or use drugs.  ROS: UROLOGY Frequent Urination?: Yes Hard to postpone urination?: Yes Burning/pain with urination?: No Get up at night to urinate?: Yes Leakage of urine?: Yes Urine stream starts and stops?: No Trouble starting stream?: No Do you have to strain to urinate?: No Blood in urine?: No Urinary tract infection?: No Sexually transmitted disease?: No Injury to kidneys or bladder?:  No Painful intercourse?: No Weak stream?: No Currently pregnant?: No Vaginal bleeding?: No Last menstrual period?: N  Gastrointestinal Nausea?: Yes Vomiting?: No Indigestion/heartburn?: No Diarrhea?: No Constipation?: No  Constitutional Fever: No Night sweats?: No Weight loss?: No Fatigue?: Yes  Skin Skin rash/lesions?: No Itching?: No  Eyes Blurred vision?: No Double vision?: No  Ears/Nose/Throat Sore throat?: No Sinus problems?: No  Hematologic/Lymphatic Swollen glands?: No Easy bruising?: No  Cardiovascular Leg swelling?: No Chest pain?: No  Respiratory Cough?: No Shortness of breath?: No  Endocrine Excessive thirst?: No  Musculoskeletal Back pain?: No Joint pain?: Yes  Neurological Headaches?: No Dizziness?: No  Psychologic Depression?: No Anxiety?: No  Physical Exam: BP (!) 142/87   Pulse 80   Ht 5' 2.5" (1.588 m)   Wt 155 lb (70.3 kg)   LMP  (LMP Unknown)   BMI 27.90 kg/m   Constitutional:  Alert and oriented, No acute distress.  Accompanied by daughter today.  Somewhat frail. HEENT: [place] AT, moist mucus membranes.  Trachea midline, no masses. Cardiovascular: No clubbing, cyanosis, or edema. Respiratory: Normal respiratory effort, no increased work of breathing. GI: Abdomen is soft, nontender, nondistended, no abdominal masses Skin: No rashes, bruises or suspicious lesions. Neurologic: Grossly intact, no focal deficits, moving all 4 extremities. Psychiatric: Normal mood and affect.  Laboratory Data: Lab Results  Component Value Date   WBC 5.5 03/24/2019   HGB 12.1 03/24/2019   HCT 38.8 03/24/2019   MCV 93.3 03/24/2019   PLT 170 03/24/2019    Lab Results  Component Value Date   CREATININE 0.61 03/24/2019    Lab Results  Component Value Date   HGBA1C 6.8 (H) 04/18/2018    Urinalysis Urinalysis today nitrate positive with many bacteria.   Assessment & Plan:    1. Chronic bacteriuria Lengthy discussion today with  the patient and her daughter regarding chronic bacterial colonization.  We discussed that review of records indicate that every time her  urine is collected whether she is symptomatic or not, she appears to be chronically colonized most likely with E. coli.  This is supported by her urinalysis today which is grossly positive however she is asymptomatic.  Additionally, we discussed that her hospital admission was primarily for vertigo and she was noted to have "UTI" however this is likely again simply bacterial colonization as she had no systemic signs of infection and was otherwise asymptomatic.  We discussed that many of her symptoms including external burning/itching irritation is likely related to atrophic vaginitis rather than true infection.  She also has baseline OAB which further complicates her presentation.  We discussed indications for treatment with antibiotics which include fever, hypotension, tachycardia, leukocytosis, and other severe refractory urinary symptoms which are different from her baseline.  We discussed antibiotic stewardship and my concern for development of resistant organism versus yeast infection versus C. difficile as complications from overuse of antibiotics.  If strongly recommended that she continue take a probiotic daily.  In addition to this, I have recommended some vaginal health guidelines which include avoiding scrubbing multiple times a day, stopping Burnell Blanks and simply allowing warm soapy fluid to run over her in the shower.  We discussed that this will help her maintain a good bacterial colonization rather than set her up for opportunistic infection.  She will also start taking a cranberry supplement twice daily rather than drink cranberry juice.  We discussed that if she has signs or symptoms concerning for UTI, she should first try conservative management with increasing fluids, cranberry tablets, using Vagisil as needed and avoiding irritating beverages  and see if her symptoms resolve spontaneously.  She is agreeable this plan.  If her symptoms are refractory or she has other more concerning signs or symptoms as discussed above, she will arrange for same-day visit with [place]. - Urinalysis, Complete - Bladder Scan (Post Void Residual) in office - CULTURE, URINE COMPREHENSIVE  2. History of kidney stones Previously treated for asymptomatic lower pole stone  This is not likely an underlying factor of her chronic bacterial colonization.  She is asymptomatic this will hold off on any further imaging at this time.  3. Atrophic vaginitis Previously on topical estrogen cream but no longer a candidate for this in light of her recent breast cancer diagnosis.  I recommended use of vaginal lubricants such as vaginal cell as needed.  She can use this multiple times a day if needed.   Prn for UTI symptoms  Hollice Espy, MD  [facility] [address] [place], [place] [address] [phone]  I spent 40 min with this patient of which greater than 50% was spent in counseling and coordination of care with the patient.  Extensive chart review was performed both in epic and care everywhere.

## 2019-06-12 LAB — CULTURE, URINE COMPREHENSIVE

## 2019-06-15 ENCOUNTER — Ambulatory Visit: Payer: Medicare Other | Admitting: General Surgery

## 2019-06-19 ENCOUNTER — Ambulatory Visit (INDEPENDENT_AMBULATORY_CARE_PROVIDER_SITE_OTHER): Payer: Medicare Other

## 2019-06-19 ENCOUNTER — Other Ambulatory Visit: Payer: Self-pay

## 2019-06-19 DIAGNOSIS — Z23 Encounter for immunization: Secondary | ICD-10-CM | POA: Diagnosis not present

## 2019-06-26 DIAGNOSIS — H353211 Exudative age-related macular degeneration, right eye, with active choroidal neovascularization: Secondary | ICD-10-CM | POA: Diagnosis not present

## 2019-06-27 DIAGNOSIS — H353221 Exudative age-related macular degeneration, left eye, with active choroidal neovascularization: Secondary | ICD-10-CM | POA: Diagnosis not present

## 2019-06-28 ENCOUNTER — Ambulatory Visit (INDEPENDENT_AMBULATORY_CARE_PROVIDER_SITE_OTHER): Payer: Medicare Other | Admitting: Podiatry

## 2019-06-28 ENCOUNTER — Other Ambulatory Visit: Payer: Self-pay

## 2019-06-28 DIAGNOSIS — Q828 Other specified congenital malformations of skin: Secondary | ICD-10-CM | POA: Diagnosis not present

## 2019-06-28 DIAGNOSIS — M79676 Pain in unspecified toe(s): Secondary | ICD-10-CM

## 2019-06-28 DIAGNOSIS — E1142 Type 2 diabetes mellitus with diabetic polyneuropathy: Secondary | ICD-10-CM

## 2019-06-28 DIAGNOSIS — B351 Tinea unguium: Secondary | ICD-10-CM

## 2019-06-28 NOTE — Patient Instructions (Signed)
None

## 2019-06-28 NOTE — Progress Notes (Signed)
She presents today chief complaint of painful toenails bilaterally.  As well as painful porokeratotic lesions plantar aspect of the bilateral foot.  She denies any injuries or open wounds.  Objective: Vital signs are stable she is alert and oriented x3.  Pulses are palpable.  Toenails are long thick yellow dystrophic onychomycotic bilateral foot reactive hyper keratomas plantar aspect of the forefoot bilaterally.  No open lesions or wounds are visible.  Assessment: Pain in limb secondary to onychomycosis porokeratosis.  Plan: Debridement of all reactive hyperkeratotic tissue debridement of toenails 1 through 5 bilateral.

## 2019-06-29 ENCOUNTER — Other Ambulatory Visit: Payer: Medicare Other

## 2019-07-04 DIAGNOSIS — E1169 Type 2 diabetes mellitus with other specified complication: Secondary | ICD-10-CM | POA: Diagnosis not present

## 2019-07-04 DIAGNOSIS — I341 Nonrheumatic mitral (valve) prolapse: Secondary | ICD-10-CM | POA: Diagnosis not present

## 2019-07-04 DIAGNOSIS — E1142 Type 2 diabetes mellitus with diabetic polyneuropathy: Secondary | ICD-10-CM | POA: Diagnosis not present

## 2019-07-04 DIAGNOSIS — I4811 Longstanding persistent atrial fibrillation: Secondary | ICD-10-CM | POA: Diagnosis not present

## 2019-07-04 DIAGNOSIS — E785 Hyperlipidemia, unspecified: Secondary | ICD-10-CM | POA: Diagnosis not present

## 2019-07-04 DIAGNOSIS — I495 Sick sinus syndrome: Secondary | ICD-10-CM | POA: Diagnosis not present

## 2019-07-04 NOTE — Telephone Encounter (Signed)
error 

## 2019-07-20 ENCOUNTER — Ambulatory Visit
Admission: RE | Admit: 2019-07-20 | Discharge: 2019-07-20 | Disposition: A | Discharge status: Home / Self Care | Payer: Medicare Other | Source: Ambulatory Visit | Attending: Oncology | Admitting: Oncology

## 2019-07-20 DIAGNOSIS — C50412 Malignant neoplasm of upper-outer quadrant of left female breast: Secondary | ICD-10-CM | POA: Insufficient documentation

## 2019-07-20 DIAGNOSIS — Z170 Estrogen receptor positive status [ER+]: Secondary | ICD-10-CM | POA: Insufficient documentation

## 2019-07-20 DIAGNOSIS — Z780 Asymptomatic menopausal state: Secondary | ICD-10-CM | POA: Diagnosis not present

## 2019-07-20 DIAGNOSIS — Z13820 Encounter for screening for osteoporosis: Secondary | ICD-10-CM | POA: Diagnosis present

## 2019-07-20 DIAGNOSIS — M85852 Other specified disorders of bone density and structure, left thigh: Secondary | ICD-10-CM | POA: Diagnosis not present

## 2019-08-07 DIAGNOSIS — H353211 Exudative age-related macular degeneration, right eye, with active choroidal neovascularization: Secondary | ICD-10-CM | POA: Diagnosis not present

## 2019-08-08 DIAGNOSIS — H353221 Exudative age-related macular degeneration, left eye, with active choroidal neovascularization: Secondary | ICD-10-CM | POA: Diagnosis not present

## 2019-08-11 ENCOUNTER — Other Ambulatory Visit: Payer: Self-pay | Admitting: Internal Medicine

## 2019-08-11 DIAGNOSIS — E1140 Type 2 diabetes mellitus with diabetic neuropathy, unspecified: Secondary | ICD-10-CM

## 2019-08-23 DIAGNOSIS — L821 Other seborrheic keratosis: Secondary | ICD-10-CM | POA: Diagnosis not present

## 2019-08-23 DIAGNOSIS — Z85828 Personal history of other malignant neoplasm of skin: Secondary | ICD-10-CM | POA: Diagnosis not present

## 2019-08-23 DIAGNOSIS — L720 Epidermal cyst: Secondary | ICD-10-CM | POA: Diagnosis not present

## 2019-08-23 DIAGNOSIS — Z08 Encounter for follow-up examination after completed treatment for malignant neoplasm: Secondary | ICD-10-CM | POA: Diagnosis not present

## 2019-09-06 NOTE — Progress Notes (Signed)
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  INTERVAL HISTORY: Patient returns to clinic today for routine 35-monthevaluation.  She continues to tolerate tamoxifen and Fosamax well without significant side effects.  She currently feels well and is asymptomatic. She has no neurologic complaints. She denies any recent fevers or illnesses. She has a good appetite and denies weight loss.  She denies any chest pain, shortness of breath, cough, or hemoptysis.  She denies any nausea, vomiting, constipation, or diarrhea. She has no urinary complaints.  Patient offers no specific complaints today.  REVIEW OF SYSTEMS:   Review of Systems  Constitutional: Negative.  Negative for fever, malaise/fatigue and weight loss.  Respiratory: Negative.  Negative for cough and shortness of breath.   Cardiovascular: Negative.  Negative for chest pain and leg swelling.  Gastrointestinal: Negative.  Negative for abdominal pain, blood in stool and melena.  Genitourinary: Negative.  Negative for dysuria.  Musculoskeletal: Negative.  Negative for back pain.  Skin: Negative.  Negative for rash.  Neurological: Negative.  Negative for sensory change, focal weakness, weakness and headaches.  Psychiatric/Behavioral: Negative.  The patient is not nervous/anxious.     As per HPI. Otherwise, a complete review of systems is negative.  PAST MEDICAL HISTORY: Past Medical History:  Diagnosis Date  . Allergy   . Anemia   . Arthritis   . [place]  . [place]   Lumpectomy and rad tx's.    .Marland KitchenCAD (coronary artery disease)   . Complication of anesthesia    hard to wake up with general anesthesia  . Diabetes ([place]   . Dysrhythmia    A-fib  . Heart attack ([place] 2008  . Heart murmur   . History of kidney stones   . History of sick sinus syndrome    s/p pacemaker placement  . Hyperlipidemia   . Macular degeneration   . Neuropathy   . Neuropathy   . Pacemaker 06/2010  . Personal history of radiation therapy 11/2017   LEFT lumpectomy   . Squamous cell skin cancer   . TIA (transient ischemic attack)     PAST SURGICAL HISTORY: Past Surgical History:  Procedure Laterality Date  . ABDOMINAL HYSTERECTOMY  1960's  . [place]  [place]  [place] BREAST BI[place]SY Left 12/17/2016   SINGLE FRAGMENT OF ATYPICAL EPITHELIAL CELLS  . [facility]   [facility]  . BREAST EXCISIONAL BIOPSY Left 01/18/1986   Benign microcalcifications, [employer].  .Marland KitchenBREAST LUMPECTOMY Left 05/26/2017   13 mm,T1c, N0; ER/ PR+, Her 2 neu negative, HIGH RISK by Mammoprint ;  Surgeon: BRobert Bellow MD;  Location: [facility] ORS;  Service: General;  Laterality: Left;  . BREAST SURGERY  1988  . CARDIAC CATHETERIZATION  1989  . CATARACT EXTRACTION  1997 and 1998  . COLONOSCOPY WITH PROPOFOL N/A 09/12/2015   Procedure: COLONOSCOPY WITH PROPOFOL;  Surgeon: RManya Silvas MD;  [facility];  Service: Endoscopy;  Laterality: N/A;  . IMPLANTABLE CARDIOVERTER DEFIBRILLATOR (ICD) GENERATOR CHANGE Left 03/29/2019   Procedure: PACEMAKER CHANGE OUT;  Surgeon: [name], MD;  Location: [facility] ORS;  Service: Cardiovascular;  Laterality: Left;  . KIDNEY STONE SURGERY  2018  . MOHS SURGERY  2019   forehead  . pace maker  2008  . [place]  . TONSILLECTOMY AND ADENOIDECTOMY  1950"s    FAMILY HISTORY: Family History  Problem Relation Age of Onset  . Stroke Mother   . Diabetes Mother   . Cancer Maternal Aunt        Breast Cancer  .  Breast cancer Maternal Aunt   . Arthritis Maternal Grandmother   . Cancer Maternal Aunt        Lung Cancer  . Diabetes Son   . Cancer Son   . Kidney disease Neg Hx   . Bladder Cancer Neg Hx     ADVANCED DIRECTIVES (Y/N):  N  HEALTH MAINTENANCE: Social History   Tobacco Use  . Smoking status: Former Smoker    Years: 5.00    Quit date: 09/14/1968    Years since quitting: 51.0  . Smokeless tobacco: Never Used  . Tobacco comment: quit 1970  Substance Use Topics  . Alcohol use: No    Alcohol/week: 0.0 standard drinks  . Drug use: No     Colonoscopy:  PAP:  Bone density:  Lipid panel:  Allergies  Allergen Reactions  . Lyrica [Pregabalin] Swelling    Sleepy, does not eat  . Neurontin [Gabapentin] Nausea And Vomiting and Other (See Comments)    disoriented  . Bactrim [Sulfamethoxazole-Trimethoprim]     Made her dizziness    Current Outpatient Medications  Medication Sig Dispense Refill  . Aflibercept (EYLEA) 2 MG/0.05ML SOLN 1 each by Intravitreal route as directed.     Marland Kitchen alendronate (FOSAMAX) 70 MG tablet TAKE 1 TABLET BY MOUTH ONCE A WEEK TAKE  WITH  A  FULL  GLASS  OF  WATER  ON  AN  EMPTY  STOMACH 12 tablet 0  . aspirin 81 MG tablet Take 81 mg by mouth daily.    . Biotin w/ Vitamins C & E (HAIR/SKIN/NAILS PO) Take 3 tablets by mouth daily. 2 in the am, 1 in the pm    . blood glucose meter kit and supplies Per patient please dispense One touch Ultra2 meter.Use meter and supplies three times a day to check blood sugar. ICD10: E11.40 1 each 0  . Cholecalciferol (VITAMIN D) 50 MCG (2000 UT) CAPS Take 4,000 Units by mouth daily.    . fluconazole (DIFLUCAN) 150 MG tablet Take 1 tablet (150 mg total) by mouth daily. 2 tablet 0  . furosemide (LASIX) 20 MG tablet Take 20 mg by mouth daily as needed for fluid.     . Magnesium 200 MG TABS Take 200 mg by mouth 2 (two) times a day.    . metFORMIN (GLUCOPHAGE-XR) 500 MG 24 hr tablet TAKE 2 TABLETS BY MOUTH TWICE DAILY (Patient  taking differently: Take 500 mg by mouth 2 (two) times daily with a meal. ) 360 tablet 2  . metoprolol (LOPRESSOR) 50 MG tablet Take 25 mg by mouth 2 (two) times daily.     . Multiple Vitamins-Minerals (PRESERVISION AREDS 2 PO) Take 1 tablet by mouth 2 (two) times daily.     Glory Rosebush ULTRA test strip USE 1 STRIP TO CHECK GLUCOSE THREE TIMES DAILY 100 each 2  . Polyethyl Glycol-Propyl Glycol (SYSTANE OP) Apply 1 drop to eye 2 (two) times daily.    . Probiotic Product (PROBIOTIC DAILY PO)  Take by mouth.    . tamoxifen (NOLVADEX) 20 MG tablet TAKE 1 TABLET BY MOUTH ONCE DAILY (Patient taking differently: Take 20 mg by mouth daily. ) 90 tablet 3  . vitamin B-12 (CYANOCOBALAMIN) 500 MCG tablet Take 500 mcg by mouth 2 (two) times a day.     No current facility-administered medications for this visit.    OBJECTIVE: Vitals:   09/12/19 1026  BP: (!) 131/58  Pulse: 70  Resp: 18  Temp: 98.7 F (37.1 C)     Body mass index is 28.58 kg/m.    ECOG FS:0 - Asymptomatic  General: Well-developed, well-nourished, no acute distress. Eyes: Pink conjunctiva, anicteric sclera. HEENT: Normocephalic, moist mucous membranes.  Breast: Exam deferred today. Lungs: No audible wheezing or coughing. Heart: Regular rate and rhythm. Abdomen: Soft, nontender, no obvious distention. Musculoskeletal: No edema, cyanosis, or clubbing. Neuro: Alert, answering all questions appropriately. Cranial nerves grossly intact. Skin: No rashes or petechiae noted. Psych: Normal affect.  LAB RESULTS:  Lab Results  Component Value Date   NA 137 03/24/2019   K 4.3 03/24/2019   CL 107 03/24/2019   CO2 22 03/24/2019   GLUCOSE 96 03/24/2019   BUN 15 03/24/2019   CREATININE 0.61 03/24/2019   CALCIUM 9.8 03/24/2019   PROT 7.3 04/18/2018   ALBUMIN 4.0 04/18/2018   AST 24 04/18/2018   ALT 18 04/18/2018   ALKPHOS 42 04/18/2018   BILITOT 0.3 04/18/2018   GFRNONAA >60 03/24/2019   GFRAA >60 03/24/2019    Lab Results    Component Value Date   WBC 5.5 03/24/2019   NEUTROABS 2.6 04/18/2018   HGB 12.1 03/24/2019   HCT 38.8 03/24/2019   MCV 93.3 03/24/2019   PLT 170 03/24/2019     STUDIES: No results found.  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  PLAN:    1.  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  She has now completed XRT.  Her most recent mammogram on February 28, 2019 was reported as BI-RADS 2.  Repeat in June 2021.  Given patient's osteoporosis, she was placed on tamoxifen which she will continue for a total of 5 years completing in 2024.  Given her high risk MammaPrint, could possibly consider extending treatment for 7 to 10 years.  No further intervention is needed at this time.  Return to clinic in 6 months after her mammogram for routine evaluation.     2.  Osteoporosis: Patient's most recent bone mineral density on July 20, 2019 reported T score of -2.3.  This is improved from 1 year prior where the T score was reported -2.5.  Continue Fosamax, calcium, and vitamin D.  Repeat bone mineral density in November 2021.    Patient expressed understanding and was in agreement with this plan. She also understands that She can call clinic at any time with any questions, concerns, or complaints.   Cancer Staging Malignant neoplasm of upper-outer quadrant of left breast in female, estrogen receptor positive ([place]) Staging form: Breast, AJCC 8th Edition - Pathologic stage from 06/27/2017: Stage IA (pT1c, pN0, cM0, G1, ER: Positive, PR: Positive, HER2: Negative) - Signed by [name], MD on 06/27/2017   [name],  MD   09/12/2019 11:12 AM

## 2019-09-11 ENCOUNTER — Other Ambulatory Visit: Payer: Self-pay

## 2019-09-11 ENCOUNTER — Other Ambulatory Visit: Payer: Self-pay | Admitting: Oncology

## 2019-09-11 NOTE — Progress Notes (Signed)
Daughter in law supplied information for chart.

## 2019-09-12 ENCOUNTER — Inpatient Hospital Stay: Payer: Medicare Other | Attending: Oncology | Admitting: Oncology

## 2019-09-12 ENCOUNTER — Other Ambulatory Visit: Payer: Self-pay

## 2019-09-12 VITALS — BP 131/58 | HR 70 | Temp 98.7°F | Resp 18 | Wt 158.8 lb

## 2019-09-12 DIAGNOSIS — Z7982 Long term (current) use of aspirin: Secondary | ICD-10-CM | POA: Diagnosis not present

## 2019-09-12 DIAGNOSIS — Z85828 Personal history of other malignant neoplasm of skin: Secondary | ICD-10-CM | POA: Diagnosis not present

## 2019-09-12 DIAGNOSIS — E119 Type 2 diabetes mellitus without complications: Secondary | ICD-10-CM | POA: Insufficient documentation

## 2019-09-12 DIAGNOSIS — Z79899 Other long term (current) drug therapy: Secondary | ICD-10-CM | POA: Diagnosis not present

## 2019-09-12 DIAGNOSIS — Z8673 Personal history of transient ischemic attack (TIA), and cerebral infarction without residual deficits: Secondary | ICD-10-CM | POA: Diagnosis not present

## 2019-09-12 DIAGNOSIS — Z79810 Long term (current) use of selective estrogen receptor modulators (SERMs): Secondary | ICD-10-CM | POA: Insufficient documentation

## 2019-09-12 DIAGNOSIS — I2510 Atherosclerotic heart disease of native coronary artery without angina pectoris: Secondary | ICD-10-CM | POA: Diagnosis not present

## 2019-09-12 DIAGNOSIS — Z923 Personal history of irradiation: Secondary | ICD-10-CM | POA: Diagnosis not present

## 2019-09-12 DIAGNOSIS — C50412 Malignant neoplasm of upper-outer quadrant of left female breast: Secondary | ICD-10-CM | POA: Diagnosis present

## 2019-09-12 DIAGNOSIS — G629 Polyneuropathy, unspecified: Secondary | ICD-10-CM | POA: Insufficient documentation

## 2019-09-12 DIAGNOSIS — I4891 Unspecified atrial fibrillation: Secondary | ICD-10-CM | POA: Diagnosis not present

## 2019-09-12 DIAGNOSIS — I252 Old myocardial infarction: Secondary | ICD-10-CM | POA: Diagnosis not present

## 2019-09-12 DIAGNOSIS — Z803 Family history of malignant neoplasm of breast: Secondary | ICD-10-CM | POA: Insufficient documentation

## 2019-09-12 DIAGNOSIS — Z87891 Personal history of nicotine dependence: Secondary | ICD-10-CM | POA: Diagnosis not present

## 2019-09-12 DIAGNOSIS — E785 Hyperlipidemia, unspecified: Secondary | ICD-10-CM | POA: Insufficient documentation

## 2019-09-12 DIAGNOSIS — Z170 Estrogen receptor positive status [ER+]: Secondary | ICD-10-CM

## 2019-09-12 DIAGNOSIS — M810 Age-related osteoporosis without current pathological fracture: Secondary | ICD-10-CM | POA: Diagnosis not present

## 2019-09-12 DIAGNOSIS — D649 Anemia, unspecified: Secondary | ICD-10-CM | POA: Diagnosis not present

## 2019-09-12 DIAGNOSIS — Z7984 Long term (current) use of oral hypoglycemic drugs: Secondary | ICD-10-CM | POA: Insufficient documentation

## 2019-09-12 DIAGNOSIS — M129 Arthropathy, unspecified: Secondary | ICD-10-CM | POA: Diagnosis not present

## 2019-09-12 NOTE — Progress Notes (Signed)
Pt in for follow up, mammogram scheduled for January, pt states had bone density test this month.

## 2019-09-22 DIAGNOSIS — D485 Neoplasm of uncertain behavior of skin: Secondary | ICD-10-CM | POA: Diagnosis not present

## 2019-09-22 DIAGNOSIS — L720 Epidermal cyst: Secondary | ICD-10-CM | POA: Diagnosis not present

## 2019-09-27 ENCOUNTER — Ambulatory Visit (INDEPENDENT_AMBULATORY_CARE_PROVIDER_SITE_OTHER): Payer: Medicare Other | Admitting: Podiatry

## 2019-09-27 ENCOUNTER — Encounter: Payer: Self-pay | Admitting: Podiatry

## 2019-09-27 ENCOUNTER — Other Ambulatory Visit: Payer: Self-pay

## 2019-09-27 DIAGNOSIS — E1142 Type 2 diabetes mellitus with diabetic polyneuropathy: Secondary | ICD-10-CM

## 2019-09-27 DIAGNOSIS — B351 Tinea unguium: Secondary | ICD-10-CM | POA: Diagnosis not present

## 2019-09-27 DIAGNOSIS — Q828 Other specified congenital malformations of skin: Secondary | ICD-10-CM | POA: Diagnosis not present

## 2019-09-27 DIAGNOSIS — M79676 Pain in unspecified toe(s): Secondary | ICD-10-CM

## 2019-09-27 NOTE — Progress Notes (Signed)
She presents today chief complaint of pain in her feet with painfully elongated toenails and calluses bilaterally.  Objective: Pulses are palpable she has reactive hyper keratomas plantar aspect of the bilateral foot hammertoe deformities and painful elongated toenails.  Assessment: Diabetic peripheral neuropathy.  Pain in limb secondary to onychomycosis porokeratosis.  Plan: Debridement of all reactive hyperkeratotic tissue debridement of toenails 1 through 5 bilateral.

## 2019-10-11 ENCOUNTER — Other Ambulatory Visit: Payer: Self-pay | Admitting: Internal Medicine

## 2019-10-16 ENCOUNTER — Other Ambulatory Visit: Payer: Self-pay

## 2019-10-16 ENCOUNTER — Ambulatory Visit (INDEPENDENT_AMBULATORY_CARE_PROVIDER_SITE_OTHER): Payer: Medicare Other | Admitting: Internal Medicine

## 2019-10-16 ENCOUNTER — Encounter: Payer: Self-pay | Admitting: Internal Medicine

## 2019-10-16 DIAGNOSIS — I2510 Atherosclerotic heart disease of native coronary artery without angina pectoris: Secondary | ICD-10-CM | POA: Diagnosis not present

## 2019-10-16 DIAGNOSIS — D509 Iron deficiency anemia, unspecified: Secondary | ICD-10-CM | POA: Diagnosis not present

## 2019-10-16 DIAGNOSIS — C50412 Malignant neoplasm of upper-outer quadrant of left female breast: Secondary | ICD-10-CM | POA: Diagnosis not present

## 2019-10-16 DIAGNOSIS — E7800 Pure hypercholesterolemia, unspecified: Secondary | ICD-10-CM

## 2019-10-16 DIAGNOSIS — M810 Age-related osteoporosis without current pathological fracture: Secondary | ICD-10-CM

## 2019-10-16 DIAGNOSIS — Z170 Estrogen receptor positive status [ER+]: Secondary | ICD-10-CM

## 2019-10-16 DIAGNOSIS — I482 Chronic atrial fibrillation, unspecified: Secondary | ICD-10-CM

## 2019-10-16 DIAGNOSIS — E1140 Type 2 diabetes mellitus with diabetic neuropathy, unspecified: Secondary | ICD-10-CM

## 2019-10-16 NOTE — Progress Notes (Signed)
Patient ID: [name], female   DOB: 1929-11-10, 86 y.o.   MRN: [id number]   Virtual Visit via telephone Note  This visit type was conducted due to national recommendations for restrictions regarding the COVID-19 pandemic (e.g. social distancing).  This format is felt to be most appropriate for this patient at this time.  All issues noted in this document were discussed and addressed.  No physical exam was performed (except for noted visual exam findings with Video Visits).   I connected with [name] by telephone and verified that I am speaking with the correct person using two identifiers. Location patient: home Location provider: work Persons participating in the telephone visit: patient, provider  The limitations, risks, security and privacy concerns of performing an evaluation and management service by telephone and the availability of in person appointments have been discussed. The patient expressed understanding and agreed to proceed.   Reason for visit: scheduled follow up.   HPI: She reports she is doing well.  Feels good.  Sees Dr Honor Junes for her diabetes.  Last a1c 6.6 only on metformin.  Off victoza.  She watches her diet.  Tries to stay active. No chest pain or sob.  No acid reflux.  No abdominal pain.  Bowels moving. No problems with her bowels.  Saw Dr Grayland Ormond 09/12/19. Mammogram 02/28/19- birads II.  On tamoxifen.  Also taking fosamax.  Tolerating.  Had new pacemaker placed this summer.  Doing well. Followed by Dr Ubaldo Glassing.  Recently decreased her metoprolol to 1/2 tablet bid.  Has received her first covid vaccine three weeks ago.  Due to get her next vaccine Wednesday 10/18/19.     ROS: See pertinent positives and negatives per HPI.  Past Medical History:  Diagnosis Date  . Allergy   . Anemia   . Arthritis   . Atrial fibrillation ([place]) 2004  . Breast cancer, left ([place]) 10/2017   Lumpectomy and rad tx's.   Marland Kitchen CAD (coronary artery disease)   . Complication of  anesthesia    hard to wake up with general anesthesia  . Diabetes ([place])   . Dysrhythmia    A-fib  . Heart attack ([place]) 2008  . Heart murmur   . History of kidney stones   . History of sick sinus syndrome    s/p pacemaker placement  . Hyperlipidemia   . Macular degeneration   . Neuropathy   . Neuropathy   . Pacemaker 06/2010  . Personal history of radiation therapy 11/2017   LEFT lumpectomy   . Squamous cell skin cancer   . TIA (transient ischemic attack)     Past Surgical History:  Procedure Laterality Date  . ABDOMINAL HYSTERECTOMY  1960's  . APPENDECTOMY  1947  . [place]  . BREAST BIOPSY Left 12/17/2016   SINGLE FRAGMENT OF ATYPICAL EPITHELIAL CELLS  . [facility]   [facility]  . BREAST EXCISIONAL BIOPSY Left 01/18/1986   Benign microcalcifications, [employer].  Marland Kitchen BREAST LUMPECTOMY Left 05/26/2017   13 mm,T1c, N0; ER/ PR+, Her 2 neu negative, HIGH RISK by Mammoprint ;  Surgeon: [name], MD;  Location: [facility] ORS;  Service: General;  Laterality: Left;  . BREAST SURGERY  1988  . CARDIAC CATHETERIZATION  1989  . CATARACT EXTRACTION  1997 and 1998  . COLONOSCOPY WITH PROPOFOL N/A 09/12/2015   Procedure: COLONOSCOPY WITH PROPOFOL;  Surgeon: [name], MD;  [facility];  Service: Endoscopy;  Laterality: N/A;  . IMPLANTABLE CARDIOVERTER DEFIBRILLATOR (ICD) GENERATOR CHANGE Left 03/29/2019   Procedure: PACEMAKER CHANGE OUT;  Surgeon: [name], MD;  Location: [facility] ORS;  Service: Cardiovascular;  Laterality: Left;  . KIDNEY STONE SURGERY  2018  . MOHS SURGERY  2019   forehead  . pace maker  2008  . [place]  . TONSILLECTOMY AND ADENOIDECTOMY  1950"s    Family History  Problem Relation Age of Onset  . Stroke Mother   . Diabetes Mother   . Cancer Maternal Aunt        Breast Cancer  . Breast cancer Maternal Aunt   . Arthritis Maternal Grandmother   . Cancer Maternal Aunt         Lung Cancer  . Diabetes Son   . Cancer Son   . Kidney disease Neg Hx   . Bladder Cancer Neg Hx     SOCIAL HX: reviewed.    Current Outpatient Medications:  .  Aflibercept (EYLEA) 2 MG/0.05ML SOLN, 1 each by Intravitreal route as directed. , Disp: , Rfl:  .  alendronate (FOSAMAX) 70 MG tablet, TAKE 1 TABLET BY MOUTH ONCE A WEEK TAKE  WITH  A  FULL  GLASS  OF  WATER  ON  AN  EMPTY  STOMACH, Disp: 12 tablet, Rfl: 0 .  aspirin 81 MG tablet, Take 81 mg by mouth daily., Disp: , Rfl:  .  Biotin w/ Vitamins C & E (HAIR/SKIN/NAILS PO), Take 3 tablets by mouth daily. 2 in the am, 1 in the pm, Disp: , Rfl:  .  blood glucose meter kit and supplies, Per patient please dispense One touch Ultra2 meter.Use meter and supplies three times a day to check blood sugar. ICD10: E11.40, Disp: 1 each, Rfl: 0 .  Cholecalciferol (VITAMIN D) 50 MCG (2000 UT) CAPS, Take 4,000 Units by mouth daily., Disp: , Rfl:  .  fluconazole (DIFLUCAN) 150 MG tablet, Take 1 tablet (150 mg total) by mouth daily., Disp: 2 tablet, Rfl: 0 .  furosemide (LASIX) 20 MG tablet, Take 20 mg by mouth daily as needed for fluid. , Disp: , Rfl:  .  Magnesium 200 MG TABS, Take 200 mg by mouth 2 (two) times a day., Disp: , Rfl:  .  metFORMIN (GLUCOPHAGE-XR) 500 MG 24 hr tablet, Take 2 tablets by mouth twice daily, Disp: 360 tablet, Rfl: 0 .  metoprolol (LOPRESSOR) 50 MG tablet, Take 25 mg by mouth 2 (two) times daily. , Disp: , Rfl:  .  Multiple Vitamins-Minerals (PRESERVISION AREDS 2 PO), Take 1 tablet by mouth 2 (two) times daily. , Disp: , Rfl:  .  ONETOUCH ULTRA test strip, USE 1 STRIP TO CHECK GLUCOSE THREE TIMES DAILY, Disp: 100 each, Rfl: 2 .  Polyethyl Glycol-Propyl Glycol (SYSTANE OP), Apply 1 drop to eye 2 (two) times daily., Disp: , Rfl:  .  Probiotic Product (PROBIOTIC DAILY PO), Take by mouth., Disp: , Rfl:  .  tamoxifen (NOLVADEX) 20 MG tablet, TAKE 1 TABLET BY MOUTH ONCE DAILY (Patient taking differently: Take 20 mg by mouth daily.  ), Disp: 90 tablet, Rfl: 3 .  vitamin B-12 (CYANOCOBALAMIN) 500 MCG tablet, Take 500 mcg by mouth 2 (two) times a day., Disp: , Rfl:   EXAM:  GENERAL: alert.  Sounds to be in no acute distress.  Answering questions appropriately.    PSYCH/NEURO: pleasant and cooperative, no obvious depression or anxiety, speech and thought processing grossly intact  ASSESSMENT AND PLAN:  Discussed  the following assessment and plan:  Age-related osteoporosis without current pathological fracture Taking fosamax.  Tolerating.   Atrial fibrillation, chronic Just had pacemaker change out.  Followed by cardiology.  Stable.   CAD (coronary artery disease) Currently asymptomatic.  Continue risk factor modification.    Diabetes mellitus with neuropathy (HCC) Last a1c 6.6.  Continue metformin.  Off victoza.  Low carb diet and exercise.  Follow met b and a1c.   Hypercholesterolemia Low cholesterol diet and exercise.  Follow lipid panel.   Iron deficiency anemia Follow cbc and ferritin.   Malignant neoplasm of upper-outer quadrant of left breast in female, estrogen receptor positive ([place]) S/p XRT.  On tamoxifen.  Mammogram 02/28/19 - Birads II.     I discussed the assessment and treatment plan with the patient. The patient was provided an opportunity to ask questions and all were answered. The patient agreed with the plan and demonstrated an understanding of the instructions.   The patient was advised to call back or seek an in-person evaluation if the symptoms worsen or if the condition fails to improve as anticipated.  I spent 21 minutes of non face to face time during this encounter.   [name], MD

## 2019-10-22 ENCOUNTER — Encounter: Payer: Self-pay | Admitting: Internal Medicine

## 2019-10-22 NOTE — Assessment & Plan Note (Signed)
S/p XRT.  On tamoxifen.  Mammogram 02/28/19 - Birads II.

## 2019-10-22 NOTE — Assessment & Plan Note (Signed)
Taking fosamax.  Tolerating.

## 2019-10-22 NOTE — Assessment & Plan Note (Signed)
Currently asymptomatic.  Continue risk factor modification.  

## 2019-10-22 NOTE — Assessment & Plan Note (Signed)
Follow cbc and ferritin.  

## 2019-10-22 NOTE — Assessment & Plan Note (Signed)
Just had pacemaker change out.  Followed by cardiology.  Stable.

## 2019-10-22 NOTE — Assessment & Plan Note (Signed)
Last a1c 6.6.  Continue metformin.  Off victoza.  Low carb diet and exercise.  Follow met b and a1c.

## 2019-10-22 NOTE — Assessment & Plan Note (Signed)
Low cholesterol diet and exercise.  Follow lipid panel.   

## 2019-10-30 DIAGNOSIS — H353211 Exudative age-related macular degeneration, right eye, with active choroidal neovascularization: Secondary | ICD-10-CM | POA: Diagnosis not present

## 2019-10-31 DIAGNOSIS — H353221 Exudative age-related macular degeneration, left eye, with active choroidal neovascularization: Secondary | ICD-10-CM | POA: Diagnosis not present

## 2019-11-09 ENCOUNTER — Encounter: Payer: Self-pay | Admitting: Radiation Oncology

## 2019-11-14 ENCOUNTER — Other Ambulatory Visit: Payer: Self-pay

## 2019-11-15 ENCOUNTER — Ambulatory Visit
Admission: RE | Admit: 2019-11-15 | Discharge: 2019-11-15 | Disposition: A | Discharge status: Home / Self Care | Payer: Medicare Other | Source: Ambulatory Visit | Attending: Radiation Oncology | Admitting: Radiation Oncology

## 2019-11-15 ENCOUNTER — Encounter: Payer: Self-pay | Admitting: Radiation Oncology

## 2019-11-15 DIAGNOSIS — Z170 Estrogen receptor positive status [ER+]: Secondary | ICD-10-CM | POA: Diagnosis not present

## 2019-11-15 DIAGNOSIS — C50412 Malignant neoplasm of upper-outer quadrant of left female breast: Secondary | ICD-10-CM | POA: Diagnosis not present

## 2019-11-15 NOTE — Progress Notes (Signed)
Radiation Oncology Follow up Note  Name: Holly Rose   Date:   11/15/2019 MRN:  [id number] DOB: [birth year]   Radiation Oncology TeleHEALTH VISIT PROGRESS NOTE  I connected with          Holly Rose  by telephone-[place] and verified that I am speaking with the correct person using two identifiers.  I discussed the limitations, risks, security and privacy concerns of performing an evaluation and management service by telemedicine and the availability of in-person appointments. I also discussed with the patient that there may be a patient responsible charge related to this service. The patient expressed understanding and agreed to proceed.    Other persons participating in the visit and their role in the encounter:    Patient's location: Home   Provider's location: work  This 86 y.o. female presents by phone for 2-year follow-up status post whole breast radiation to her left breast for stage I ER/PR positive invasive mammary carcinoma  REFERRING PROVIDER: [name], MD  HPI: Patient is a 86 year old female now out 2 years having completed whole breast radiation to her left breast for stage I ER/PR positive invasive mammary carcinoma she was contacted by phone she is doing well specifically denies breast tenderness cough or bone pain..  She is currently on tamoxifen tolerating that well.  Mammograms back in June which I have reviewed were BI-RADS 2 benign.  COMPLICATIONS OF TREATMENT: none  FOLLOW UP COMPLIANCE: keeps appointments   PHYSICAL EXAM:  LMP  (LMP Unknown)  No physical performed since this was a telephone visit  RADIOLOGY RESULTS: Phillip Heal reviewed compatible with above-stated findings  PLAN: Present time patient continues to do well 2 years out with no evidence of disease.  Based on her age and mobility issues I am going to discontinue follow-up care.  She continues to have yearly mammograms.  She continues on tamoxifen.  Patient knows to call with any concerns at any  time.  I would like to take this opportunity to thank you for allowing me to participate in the care of your patient.Noreene Filbert, MD

## 2019-11-16 DIAGNOSIS — H40003 Preglaucoma, unspecified, bilateral: Secondary | ICD-10-CM | POA: Diagnosis not present

## 2019-11-16 LAB — HM DIABETES EYE EXAM

## 2019-12-05 DIAGNOSIS — I495 Sick sinus syndrome: Secondary | ICD-10-CM | POA: Diagnosis not present

## 2019-12-11 ENCOUNTER — Other Ambulatory Visit: Payer: Self-pay | Admitting: Oncology

## 2019-12-11 DIAGNOSIS — H353211 Exudative age-related macular degeneration, right eye, with active choroidal neovascularization: Secondary | ICD-10-CM | POA: Diagnosis not present

## 2019-12-11 MED ORDER — ALENDRONATE SODIUM 70 MG PO TABS: 70.0000 mg | ORAL_TABLET | ORAL | 3 refills | Status: DC

## 2019-12-12 DIAGNOSIS — H353221 Exudative age-related macular degeneration, left eye, with active choroidal neovascularization: Secondary | ICD-10-CM | POA: Diagnosis not present

## 2019-12-18 ENCOUNTER — Other Ambulatory Visit: Payer: Self-pay

## 2019-12-18 ENCOUNTER — Emergency Department
Admission: EM | Admit: 2019-12-18 | Discharge: 2019-12-18 | Disposition: A | Discharge status: Home / Self Care | Payer: Medicare Other | Attending: Emergency Medicine | Admitting: Emergency Medicine

## 2019-12-18 ENCOUNTER — Encounter: Payer: Self-pay | Admitting: Emergency Medicine

## 2019-12-18 DIAGNOSIS — C50412 Malignant neoplasm of upper-outer quadrant of left female breast: Secondary | ICD-10-CM | POA: Diagnosis not present

## 2019-12-18 DIAGNOSIS — R040 Epistaxis: Secondary | ICD-10-CM | POA: Diagnosis not present

## 2019-12-18 DIAGNOSIS — Z79899 Other long term (current) drug therapy: Secondary | ICD-10-CM | POA: Diagnosis not present

## 2019-12-18 DIAGNOSIS — I2510 Atherosclerotic heart disease of native coronary artery without angina pectoris: Secondary | ICD-10-CM | POA: Diagnosis not present

## 2019-12-18 DIAGNOSIS — E1140 Type 2 diabetes mellitus with diabetic neuropathy, unspecified: Secondary | ICD-10-CM | POA: Diagnosis not present

## 2019-12-18 DIAGNOSIS — I4891 Unspecified atrial fibrillation: Secondary | ICD-10-CM | POA: Diagnosis not present

## 2019-12-18 DIAGNOSIS — Z7982 Long term (current) use of aspirin: Secondary | ICD-10-CM | POA: Insufficient documentation

## 2019-12-18 DIAGNOSIS — Z7984 Long term (current) use of oral hypoglycemic drugs: Secondary | ICD-10-CM | POA: Diagnosis not present

## 2019-12-18 LAB — CBC WITH DIFFERENTIAL/PLATELET
Abs Immature Granulocytes: 0.02 10*3/uL (ref 0.00–0.07)
Basophils Absolute: 0 10*3/uL (ref 0.0–0.1)
Basophils Relative: 1 %
Eosinophils Absolute: 0 10*3/uL (ref 0.0–0.5)
Eosinophils Relative: 1 %
HCT: 38.1 % (ref 36.0–46.0)
Hemoglobin: 12 g/dL (ref 12.0–15.0)
Immature Granulocytes: 0 %
Lymphocytes Relative: 28 %
Lymphs Abs: 1.4 10*3/uL (ref 0.7–4.0)
MCH: 29.6 pg (ref 26.0–34.0)
MCHC: 31.5 g/dL (ref 30.0–36.0)
MCV: 93.8 fL (ref 80.0–100.0)
Monocytes Absolute: 0.3 10*3/uL (ref 0.1–1.0)
Monocytes Relative: 7 %
Neutro Abs: 3.1 10*3/uL (ref 1.7–7.7)
Neutrophils Relative %: 63 %
Platelets: 161 10*3/uL (ref 150–400)
RBC: 4.06 MIL/uL (ref 3.87–5.11)
RDW: 14.9 % (ref 11.5–15.5)
WBC: 4.9 10*3/uL (ref 4.0–10.5)
nRBC: 0 % (ref 0.0–0.2)

## 2019-12-18 LAB — BASIC METABOLIC PANEL
Anion gap: 9 (ref 5–15)
BUN: 13 mg/dL (ref 8–23)
CO2: 23 mmol/L (ref 22–32)
Calcium: 9.3 mg/dL (ref 8.9–10.3)
Chloride: 106 mmol/L (ref 98–111)
Creatinine, Ser: 0.53 mg/dL (ref 0.44–1.00)
GFR calc Af Amer: >60 mL/min (ref >60)
GFR calc non Af Amer: >60 mL/min (ref >60)
Glucose, Bld: 107 mg/dL — ABNORMAL HIGH (ref 70–99)
Potassium: 3.8 mmol/L (ref 3.5–5.1)
Sodium: 138 mmol/L (ref 135–145)

## 2019-12-18 LAB — PROTIME-INR
INR: 1 (ref 0.8–1.2)
Prothrombin Time: 13.3 seconds (ref 11.4–15.2)

## 2019-12-18 LAB — APTT: aPTT: 30 seconds (ref 24–36)

## 2019-12-18 MED ORDER — OXYMETAZOLINE HCL 0.05 % NA SOLN
1.0000 | Freq: Once | NASAL | Status: AC
  Administered 2019-12-18: 1 via NASAL
  Filled 2019-12-18: qty 30

## 2019-12-18 NOTE — ED Provider Notes (Signed)
[facility] Emergency Department Provider Note  ____________________________________________   First MD Initiated Contact with Patient 12/18/19 1331     (approximate)  I have reviewed the triage vital signs and the nursing notes.   HISTORY  Chief Complaint Epistaxis    HPI Holly Rose is a 86 y.o. female with atrial fibrillation on 2 baby aspirin daily, prior breast cancer status post lumpectomy and on chemotherapy, diabetes who comes in with nosebleed.  Patient stated that she woke up and was walking around and she noted to have some bleeding out of her right nostril.  She denies having any falls.  Patient states that the bleeding was constant, moderate, nothing made it better, nothing made it worse.  She thinks that the bleeding lasted about 3 hours.  She went over to the [place] [facility] who sent her here.  Since then the bleeding has resolved.  Reports some smaller nosebleeds previously but never need to see ENT.          Past Medical History:  Diagnosis Date  . Allergy   . Anemia   . Arthritis   . Atrial fibrillation ([place]) 2004  . Breast cancer, left ([place]) 10/2017   Lumpectomy and rad tx's.   Marland Kitchen CAD (coronary artery disease)   . Complication of anesthesia    hard to wake up with general anesthesia  . Diabetes ([place])   . Dysrhythmia    A-fib  . Heart attack ([place]) 2008  . Heart murmur   . History of kidney stones   . History of sick sinus syndrome    s/p pacemaker placement  . Hyperlipidemia   . Macular degeneration   . Neuropathy   . Neuropathy   . Pacemaker 06/2010  . Personal history of radiation therapy 11/2017   LEFT lumpectomy   . Squamous cell skin cancer   . TIA (transient ischemic attack)     Patient Active Problem List   Diagnosis Date Noted  . Dysuria 04/30/2019  . Frequent urinary tract infections 04/30/2019  . Age-related osteoporosis without current pathological fracture 03/01/2019  . Dizziness 08/29/2018  .  UTI (urinary tract infection) 08/15/2018  . Vitamin D deficiency, unspecified 06/16/2017  . Hypercalcinuria 06/14/2017  . SCC (squamous cell carcinoma), face 03/29/2017  . History of kidney stones 03/12/2017  . Malignant neoplasm of upper-outer quadrant of left breast in female, estrogen receptor positive ([place]) 12/28/2016  . Chronic cystitis 11/05/2016  . Nephrolithiasis 11/05/2016  . Renal colic 58/52/7782  . Urge incontinence 11/05/2016  . Bilateral hand pain 11/03/2016  . Generalized osteoarthritis of hand 11/03/2016  . Numbness and tingling in both hands 11/03/2016  . Bradycardia 04/15/2016  . History of colonic polyps 09/16/2015  . Abdominal pain 08/18/2015  . Rectal bleeding 08/18/2015  . CAD (coronary artery disease) 02/17/2015  . Atrial fibrillation, chronic 02/17/2015  . Diabetes mellitus with neuropathy ([place]) 02/17/2015  . Hypercholesterolemia 02/17/2015  . Iron deficiency anemia 02/17/2015  . Macular degeneration 02/17/2015  . Stress 02/17/2015  . Health care maintenance 02/17/2015  . Diarrhea 02/17/2015  . Type II diabetes mellitus ([place]) 07/13/2014  . Diverticulosis 03/31/2013  . GI bleeding 03/31/2013  . Mitral valve prolapse 03/31/2013  . Diverticulosis of intestine without perforation or abscess without bleeding 03/31/2013    Past Surgical History:  Procedure Laterality Date  . ABDOMINAL HYSTERECTOMY  1960's  . APPENDECTOMY  1947  . [place]  . BREAST BIOPSY Left 12/17/2016  SINGLE FRAGMENT OF ATYPICAL EPITHELIAL CELLS  . BREAST BIOPSY Left 04/08/2017   [facility]  . BREAST EXCISIONAL BIOPSY Left 01/18/1986   Benign microcalcifications, [employer].  Marland Kitchen BREAST LUMPECTOMY Left 05/26/2017   13 mm,T1c, N0; ER/ PR+, Her 2 neu negative, HIGH RISK by [facility] ;  Surgeon: [name], MD;  Location: [facility] ORS;  Service: General;  Laterality: Left;  . BREAST SURGERY  1988  . CARDIAC CATHETERIZATION  1989  . CATARACT EXTRACTION  1997  and 1998  . COLONOSCOPY WITH PROPOFOL N/A 09/12/2015   Procedure: COLONOSCOPY WITH PROPOFOL;  Surgeon: [name], MD;  Location: [facility] ENDOSCOPY;  Service: Endoscopy;  Laterality: N/A;  . IMPLANTABLE CARDIOVERTER DEFIBRILLATOR (ICD) GENERATOR CHANGE Left 03/29/2019   Procedure: PACEMAKER CHANGE OUT;  Surgeon: [name], MD;  Location: [facility] ORS;  Service: Cardiovascular;  Laterality: Left;  . KIDNEY STONE SURGERY  2018  . MOHS SURGERY  2019   forehead  . pace maker  2008  . [place]  . TONSILLECTOMY AND ADENOIDECTOMY  1950"s    Prior to Admission medications   Medication Sig Start Date End Date Taking? Authorizing Provider  Aflibercept (EYLEA) 2 MG/0.05ML SOLN 1 each by Intravitreal route as directed.     [provider]  alendronate (FOSAMAX) 70 MG tablet Take 1 tablet (70 mg total) by mouth once a week. Take with a full glass of water on an empty stomach. 12/11/19   [name], MD  aspirin 81 MG tablet Take 81 mg by mouth daily.    [provider]  Biotin w/ Vitamins C & E (HAIR/SKIN/NAILS PO) Take 3 tablets by mouth daily. 2 in the am, 1 in the pm    [provider]  blood glucose meter kit and supplies Per patient please dispense One touch Ultra2 meter.Use meter and supplies three times a day to check blood sugar. ICD10: E11.40 07/25/15   [name], MD  Cholecalciferol (VITAMIN D) 50 MCG (2000 UT) CAPS Take 4,000 Units by mouth daily.    [provider]  fluconazole (DIFLUCAN) 150 MG tablet Take 1 tablet (150 mg total) by mouth daily. Patient not taking: Reported on 11/15/2019 05/08/19   Crecencio Mc, MD  furosemide (LASIX) 20 MG tablet Take 20 mg by mouth daily as needed for fluid.     [provider]  Magnesium 200 MG TABS Take 200 mg by mouth 2 (two) times a day.    [provider]  metFORMIN (GLUCOPHAGE-XR) 500 MG 24 hr tablet Take 2 tablets by mouth twice daily 10/11/19   [name],  MD  metoprolol (LOPRESSOR) 50 MG tablet Take 25 mg by mouth 2 (two) times daily.  02/03/16   [provider]  Multiple Vitamins-Minerals (PRESERVISION AREDS 2 PO) Take 1 tablet by mouth 2 (two) times daily.     [provider]  [facility] ULTRA test strip USE 1 STRIP TO CHECK GLUCOSE THREE TIMES DAILY 08/14/19   [name], MD  Polyethyl Glycol-Propyl Glycol (SYSTANE OP) Apply 1 drop to eye 2 (two) times daily.    [provider]  Probiotic Product (PROBIOTIC DAILY PO) Take by mouth.    [provider]  tamoxifen (NOLVADEX) 20 MG tablet TAKE 1 TABLET BY MOUTH ONCE DAILY Patient taking differently: Take 20 mg by mouth daily.  09/05/18   [name], MD  vitamin B-12 (CYANOCOBALAMIN) 500 MCG tablet Take 500 mcg by mouth 2 (two) times a day.  [provider]    Allergies Lyrica [pregabalin], Neurontin [gabapentin], and Bactrim [sulfamethoxazole-trimethoprim]  Family History  Problem Relation Age of Onset  . Stroke Mother   . Diabetes Mother   . Cancer Maternal Aunt        Breast Cancer  . Breast cancer Maternal Aunt   . Arthritis Maternal Grandmother   . Cancer Maternal Aunt        Lung Cancer  . Diabetes Son   . Cancer Son   . Kidney disease Neg Hx   . Bladder Cancer Neg Hx     Social History Social History   Tobacco Use  . Smoking status: Former Smoker    Years: 5.00    Quit date: 09/14/1968    Years since quitting: 51.2  . Smokeless tobacco: Never Used  . Tobacco comment: quit 1970  Substance Use Topics  . Alcohol use: No    Alcohol/week: 0.0 standard drinks  . Drug use: No      Review of Systems Constitutional: No fever/chills Eyes: No visual changes. ENT: No sore throat.  Nosebleed Cardiovascular: Denies chest pain. Respiratory: Denies shortness of breath. Gastrointestinal: No abdominal pain.  No nausea, no vomiting.  No diarrhea.  No constipation. Genitourinary: Negative for dysuria. Musculoskeletal:  Negative for back pain. Skin: Negative for rash. Neurological: Negative for headaches, focal weakness or numbness. All other ROS negative ____________________________________________   PHYSICAL EXAM:  VITAL SIGNS: ED Triage Vitals  Enc Vitals Group     BP 12/18/19 1140 (!) 150/88     Pulse Rate 12/18/19 1140 80     Resp 12/18/19 1140 16     Temp 12/18/19 1140 97.9 F (36.6 C)     Temp Source 12/18/19 1140 Oral     SpO2 12/18/19 1140 96 %     Weight 12/18/19 1141 155 lb (70.3 kg)     Height 12/18/19 1141 5' 2"  (1.575 m)     Head Circumference --      Peak Flow --      Pain Score 12/18/19 1320 0     Pain Loc --      Pain Edu? --      Excl. in [place]? --     Constitutional: Alert and oriented. Well appearing and in no acute distress. Eyes: Conjunctivae are normal. EOMI. Head: Atraumatic. Nose: Normal left naris with right nares with a little bit of dried blood but no active bleeding. Mouth/Throat: Mucous membranes are moist.  OP is clear Neck: No stridor. Trachea Midline. FROM Cardiovascular: Normal rate, regular rhythm. Grossly normal heart sounds.  Good peripheral circulation. Respiratory: Normal respiratory effort.  No retractions. Lungs CTAB. Gastrointestinal: Soft and nontender. No distention. No abdominal bruits.  Musculoskeletal: No lower extremity tenderness nor edema.  No joint effusions. Neurologic:  Normal speech and language. No gross focal neurologic deficits are appreciated.  Skin:  Skin is warm, dry and intact. No rash noted. Psychiatric: Mood and affect are normal. Speech and behavior are normal. GU: Deferred   ____________________________________________   LABS (all labs ordered are listed, but only abnormal results are displayed)  Labs Reviewed  BASIC METABOLIC PANEL - Abnormal; Notable for the following components:      Result Value   Glucose, Bld 107 (*)    All other components within normal limits  CBC WITH DIFFERENTIAL/PLATELET  PROTIME-INR  APTT    ____________________________________________    PROCEDURES  Procedure(s) performed (including Critical Care):  Procedures   ____________________________________________   INITIAL IMPRESSION / ASSESSMENT  AND PLAN / ED COURSE  Holly Rose was evaluated in Emergency Department on 12/18/2019 for the symptoms described in the history of present illness. She was evaluated in the context of the global COVID-19 pandemic, which necessitated consideration that the patient might be at risk for infection with the SARS-CoV-2 virus that causes COVID-19. Institutional protocols and algorithms that pertain to the evaluation of patients at risk for COVID-19 are in a state of rapid change based on information released by regulatory bodies including the CDC and federal and state organizations. These policies and algorithms were followed during the patient's care in the ED.     Patient is a well-appearing 86 year old slightly hypertensive who comes in for right nasal bleed on double baby aspirin.  Upon my evaluation patient is already been in the ER for 2 hours and the bleeding has completely resided.  Discussed with family proceeding with labs to make sure no evidence of anemia or coagulopathy or thrombocytopenia given her age and she is on aspirin.  They are okay with proceeding with labs.  We discussed further treatment if the bleeding restarts including nasal clip that was provided and Afrin spray.  Will give ENT follow-up if it is becoming recurrent.  Most likely secondary to weather changes.  No active bleeding at this time and patient is very well-appearing.  She did not have any falls to suggest trauma.  3:06 PM labs are reassuring.  No evidence of anemia.  Reevaluated patient updated on results.  Patient had no bleeding since initially seen.  Patient feels comfortable discharge home at this time  I discussed the provisional nature of ED diagnosis, the treatment so far, the ongoing plan of care,  follow up appointments and return precautions with the patient and any family or support people present. They expressed understanding and agreed with the plan, discharged home.   ____________________________________________   FINAL CLINICAL IMPRESSION(S) / ED DIAGNOSES   Final diagnoses:  Epistaxis      MEDICATIONS GIVEN DURING THIS VISIT:  Medications  oxymetazoline (AFRIN) 0.05 % nasal spray 1 spray (1 spray Each Nare Given 12/18/19 1345)     ED Discharge Orders    None       Note:  This document was prepared using Dragon voice recognition software and may include unintentional dictation errors.   Vanessa Frankfort, MD 12/18/19 [phone]

## 2019-12-18 NOTE — ED Notes (Signed)
Pt states nose bleeding since 0800 this am. Bleeding controlled at this time

## 2019-12-18 NOTE — Discharge Instructions (Signed)
If the nosebleed restarts you can place the clip for 10 minutes and then spray the Afrin spray and replace the clip for 10 minutes if it is not resolving you can return to the ER.  Otherwise you can follow-up with ENT.

## 2019-12-18 NOTE — ED Triage Notes (Signed)
here for nosebleed.  Has been bleeding since 8 am.  Pt denies blood thinners despite afib hx.  No blood thinners noted in med list.  Bleeding is right nare only. Scant blood on gauze pt holding.  NAD. Airway intact. VSS

## 2019-12-27 ENCOUNTER — Ambulatory Visit (INDEPENDENT_AMBULATORY_CARE_PROVIDER_SITE_OTHER): Payer: Medicare Other | Admitting: Podiatry

## 2019-12-27 ENCOUNTER — Encounter: Payer: Self-pay | Admitting: Podiatry

## 2019-12-27 ENCOUNTER — Other Ambulatory Visit: Payer: Self-pay

## 2019-12-27 DIAGNOSIS — E1142 Type 2 diabetes mellitus with diabetic polyneuropathy: Secondary | ICD-10-CM

## 2019-12-27 DIAGNOSIS — M79676 Pain in unspecified toe(s): Secondary | ICD-10-CM

## 2019-12-27 DIAGNOSIS — B351 Tinea unguium: Secondary | ICD-10-CM

## 2019-12-27 DIAGNOSIS — M79609 Pain in unspecified limb: Secondary | ICD-10-CM

## 2019-12-27 DIAGNOSIS — Q828 Other specified congenital malformations of skin: Secondary | ICD-10-CM

## 2019-12-27 NOTE — Progress Notes (Signed)
She presents today chief complaint of painfully elongated toenails and calluses bilaterally.  Objective: Vital signs are stable alert oriented x3 pulses are palpable.  Toenails are long thick yellow dystrophic-like mycotic reactive hyperkeratotic plantar aspect right foot.  No open lesions or wounds are noted.  Assessment: Pain in limb secondary to onychomycosis.  Plan: Debridement of toenails and calluses bilateral.

## 2020-01-02 DIAGNOSIS — J340 Abscess, furuncle and carbuncle of nose: Secondary | ICD-10-CM | POA: Diagnosis not present

## 2020-01-02 DIAGNOSIS — E785 Hyperlipidemia, unspecified: Secondary | ICD-10-CM | POA: Diagnosis not present

## 2020-01-02 DIAGNOSIS — R040 Epistaxis: Secondary | ICD-10-CM | POA: Diagnosis not present

## 2020-01-02 DIAGNOSIS — I341 Nonrheumatic mitral (valve) prolapse: Secondary | ICD-10-CM | POA: Diagnosis not present

## 2020-01-02 DIAGNOSIS — E1169 Type 2 diabetes mellitus with other specified complication: Secondary | ICD-10-CM | POA: Diagnosis not present

## 2020-01-02 DIAGNOSIS — I495 Sick sinus syndrome: Secondary | ICD-10-CM | POA: Diagnosis not present

## 2020-01-02 DIAGNOSIS — I4811 Longstanding persistent atrial fibrillation: Secondary | ICD-10-CM | POA: Diagnosis not present

## 2020-01-08 DIAGNOSIS — R040 Epistaxis: Secondary | ICD-10-CM | POA: Diagnosis not present

## 2020-01-29 DIAGNOSIS — H353211 Exudative age-related macular degeneration, right eye, with active choroidal neovascularization: Secondary | ICD-10-CM | POA: Diagnosis not present

## 2020-01-31 ENCOUNTER — Other Ambulatory Visit: Payer: Self-pay | Admitting: Internal Medicine

## 2020-02-05 DIAGNOSIS — H353221 Exudative age-related macular degeneration, left eye, with active choroidal neovascularization: Secondary | ICD-10-CM | POA: Diagnosis not present

## 2020-02-07 ENCOUNTER — Telehealth: Payer: Self-pay

## 2020-02-07 MED ORDER — MECLIZINE HCL 12.5 MG PO TABS: 12.5000 mg | ORAL_TABLET | Freq: Two times a day (BID) | ORAL | 0 refills | Status: DC | PRN

## 2020-02-07 NOTE — Telephone Encounter (Signed)
Patient stated she is unsure of the medication. She stated PCP can make the decision on the medication. She fully trusts PCP. SX are overall the same but she is still in the same chair, she doesn't want to chance walking around her house. She would prefer medication be called in.

## 2020-02-07 NOTE — Telephone Encounter (Signed)
Sounds like this is her typical symptoms she has with her vertigo and per your note, no unusual or different symptoms.  In reviewing, I do not see where I have sent in a medication.  Please confirm with pt - does she want antivert/meclizine.  I can send this in.  If needs virtual visit with me tomorrow then I can work in if needed.  Just let me know.

## 2020-02-07 NOTE — Telephone Encounter (Signed)
Patient stated she is having a bout with vertigo. She would like for dr scott to send in the medication for it. She woke up at 0500 unable to get out of bed due to if she made any kind of movement too fast she became dizzy and vomit. She is currently sitting in a chair ok, she does have a hx of vertigo and also cardia related. She is not have shortness of breath, jaw pain, arm pain, blurry vision, and or headaches. She stated she would have had [name] call and discuss with Dr [name] but [name] is out of [place] on vacation. She is unable to go anywhere for an appointment at this time. Please advise.

## 2020-02-07 NOTE — Addendum Note (Signed)
Addended by: Alisa Graff on: 02/07/2020 04:38 PM   Modules accepted: Orders

## 2020-02-07 NOTE — Telephone Encounter (Signed)
Patient has been informed.

## 2020-02-07 NOTE — Telephone Encounter (Signed)
rx sent in for meclizine.  May make her sleepy.  Needs assistance with ambulation.  Let us know or needs to be evaluated if symptoms worsen or any change in symptoms.

## 2020-02-11 ENCOUNTER — Encounter: Payer: Self-pay | Admitting: Internal Medicine

## 2020-02-13 NOTE — Telephone Encounter (Signed)
I am ok to take over her diabetes.  I am sending back to you.  Was not sure what I was to do with the message about Denisa.

## 2020-02-27 ENCOUNTER — Encounter: Payer: Self-pay | Admitting: Internal Medicine

## 2020-02-27 ENCOUNTER — Encounter: Payer: Self-pay | Admitting: Oncology

## 2020-02-27 ENCOUNTER — Telehealth: Payer: Self-pay | Admitting: Internal Medicine

## 2020-02-27 NOTE — Telephone Encounter (Signed)
Daughter in law is going to call and see which meter is preferred and let me know what to send in

## 2020-02-27 NOTE — Telephone Encounter (Signed)
Pt's daughter in law called and wants to get pt new glucose meter and strips. She would like a call back today. Please advise

## 2020-02-28 ENCOUNTER — Encounter: Payer: Self-pay | Admitting: Internal Medicine

## 2020-02-29 NOTE — Progress Notes (Signed)
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  INTERVAL HISTORY: Patient returns to clinic today for routine 17-monthevaluation.  She currently feels well and is asymptomatic.  She is tolerating tamoxifen and Fosamax well without significant side effects.  She has no neurologic complaints. She denies any recent fevers or illnesses. She has a good appetite and denies weight loss.  She denies any chest pain, shortness of breath, cough, or hemoptysis.  She denies any nausea, vomiting, constipation, or diarrhea. She has no urinary complaints.  Patient offers no specific complaints today.  REVIEW OF SYSTEMS:   Review of Systems  Constitutional: Negative.  Negative for fever, malaise/fatigue and weight loss.  Respiratory: Negative.  Negative for cough and shortness of breath.   Cardiovascular: Negative.  Negative for chest pain and leg swelling.  Gastrointestinal: Negative.  Negative for abdominal pain, blood in stool and melena.  Genitourinary: Negative.  Negative for dysuria.  Musculoskeletal: Negative.  Negative for back pain.  Skin: Negative.  Negative for rash.  Neurological: Negative.  Negative for sensory change, focal weakness, weakness and headaches.  Psychiatric/Behavioral: Negative.  The patient is not nervous/anxious.     As per HPI. Otherwise, a complete review of systems is negative.  PAST MEDICAL HISTORY: Past Medical History:  Diagnosis Date  . Allergy   . Anemia   . Arthritis   . Atrial fibrillation ([facility] [place]  . [place]  [place] [place]   Lumpectomy and rad tx's.   .Marland KitchenCAD (coronary artery disease)   . Complication of anesthesia    hard to wake up with general anesthesia  . Diabetes ([facility]   . Dysrhythmia    A-fib  . Heart attack ([facility] 2008  . Heart murmur   . History of kidney stones   . History of sick sinus syndrome    s/p pacemaker placement  . Hyperlipidemia   . Macular degeneration   . Neuropathy   . Neuropathy   . Pacemaker 06/2010  . Personal history of radiation therapy 11/2017   LEFT lumpectomy   . Squamous cell skin cancer   . TIA (transient ischemic attack)     PAST SURGICAL HISTORY: Past Surgical History:  Procedure Laterality Date  . ABDOMINAL HYSTERECTOMY  1960's  . [place]  [place] BRE[place] BI[place] 12/17/2016   SINGLE FRAGMENT OF ATYPICAL EPITHELIAL CELLS  . BREAST BIOPSY Left 04/08/2017   [facility]  . BREAST EXCISIONAL BIOPSY Left 01/18/1986   Benign microcalcifications, [employer].  .Marland KitchenBREAST LUMPECTOMY Left 05/26/2017   13 mm,T1c, N0; ER/ PR+, Her 2 neu negative, HIGH RISK by Mammoprint ;  Surgeon: BRobert Bellow MD;  Location: [facility] ORS;  Service: General;  Laterality: Left;  . BREAST SURGERY  1988  . CARDIAC CATHETERIZATION  1989  . CATARACT EXTRACTION  1997 and 1998  . COLONOSCOPY WITH PROPOFOL N/A 09/12/2015   Procedure: COLONOSCOPY WITH PROPOFOL;  Surgeon: RManya Silvas MD;  [facility];  Service: Endoscopy;  Laterality: N/A;  . IMPLANTABLE CARDIOVERTER DEFIBRILLATOR (ICD) GENERATOR CHANGE Left  03/29/2019   Procedure: PACEMAKER CHANGE OUT;  Surgeon: [name], MD;  Location: [facility] ORS;  Service: Cardiovascular;  Laterality: Left;  . KIDNEY STONE SURGERY  2018  . MOHS SURGERY  2019   forehead  . pace maker  2008  . [place]  . TONSILLECTOMY AND ADENOIDECTOMY  1950"s    FAMILY HISTORY: Family History  Problem Relation Age of Onset  . Stroke Mother   . Diabetes  Mother   . Cancer Maternal Aunt        Breast Cancer  . Breast cancer Maternal Aunt   . Arthritis Maternal Grandmother   . Cancer Maternal Aunt        Lung Cancer  . Diabetes Son   . Cancer Son   . Kidney disease Neg Hx   . Bladder Cancer Neg Hx     ADVANCED DIRECTIVES (Y/N):  N  HEALTH MAINTENANCE: Social History   Tobacco Use  . Smoking status: Former Smoker    Years: 5.00    Quit date: 09/14/1968    Years since quitting: 51.5  . Smokeless tobacco: Never Used  . Tobacco comment: quit 1970  Vaping Use  . Vaping Use: Never used  Substance Use Topics  . Alcohol use: No    Alcohol/week: 0.0 standard drinks  . Drug use: No     Colonoscopy:  PAP:  Bone density:  Lipid panel:  Allergies  Allergen Reactions  . Lyrica [Pregabalin] Swelling    Sleepy, does not eat  . Neurontin [Gabapentin] Nausea And Vomiting and Other (See Comments)    disoriented  . Bactrim [Sulfamethoxazole-Trimethoprim]     Made her dizziness    Current Outpatient Medications  Medication Sig Dispense Refill  . Aflibercept (EYLEA) 2 MG/0.05ML SOLN 1 each by Intravitreal route as directed.     Marland Kitchen alendronate (FOSAMAX) 70 MG tablet Take 1 tablet (70 mg total) by mouth once a week. Take with a full glass of water on an empty stomach. 12 tablet 3  . aspirin 81 MG tablet Take 81 mg by mouth daily.    . Biotin w/ Vitamins C & E (HAIR/SKIN/NAILS PO) Take 3 tablets by mouth daily. 2 in the am, 1 in the pm    . blood glucose meter kit and supplies Per patient please dispense One touch Ultra2 meter.Use meter and supplies three times a day to check blood sugar. ICD10: E11.40 1 each 0  . Cholecalciferol (VITAMIN D) 50 MCG (2000 UT) CAPS Take 4,000 Units by mouth daily.    . furosemide (LASIX) 20 MG tablet Take 20 mg by mouth daily as needed for fluid.     . Magnesium 200 MG TABS Take 200 mg by mouth 2 (two) times a day.    . meclizine (ANTIVERT) 12.5 MG tablet Take 1 tablet (12.5 mg total) by mouth 2 (two) times  daily as needed for dizziness. 14 tablet 0  . metFORMIN (GLUCOPHAGE-XR) 500 MG 24 hr tablet Take 2 tablets by mouth twice daily 360 tablet 0  . metoprolol (LOPRESSOR) 50 MG tablet Take 25 mg by mouth 2 (two) times daily.     . Multiple Vitamins-Minerals (PRESERVISION AREDS 2 PO) Take 1 tablet by mouth 2 (two) times daily.     Glory Rosebush ULTRA test strip USE 1 STRIP TO CHECK GLUCOSE THREE TIMES DAILY 100 each 2  . Polyethyl Glycol-Propyl Glycol (SYSTANE OP) Apply 1 drop to eye 2 (two) times daily.    . Probiotic Product (PROBIOTIC DAILY  PO) Take by mouth.    . tamoxifen (NOLVADEX) 20 MG tablet TAKE 1 TABLET BY MOUTH ONCE DAILY (Patient taking differently: Take 20 mg by mouth daily. ) 90 tablet 3  . vitamin B-12 (CYANOCOBALAMIN) 500 MCG tablet Take 500 mcg by mouth 2 (two) times a day.     No current facility-administered medications for this visit.    OBJECTIVE: Vitals:   03/05/20 1054  BP: 131/79  Pulse: 86  Resp: 20  Temp: 98.6 F (37 C)     Body mass index is 29.81 kg/m.    ECOG FS:0 - Asymptomatic  General: Well-developed, well-nourished, no acute distress. Eyes: Pink conjunctiva, anicteric sclera. HEENT: Normocephalic, moist mucous membranes. Breasts: Exam deferred today. Lungs: No audible wheezing or coughing. Heart: Regular rate and rhythm. Abdomen: Soft, nontender, no obvious distention. Musculoskeletal: No edema, cyanosis, or clubbing. Neuro: Alert, answering all questions appropriately. Cranial nerves grossly intact. Skin: No rashes or petechiae noted. Psych: Normal affect.   LAB RESULTS:  Lab Results  Component Value Date   NA 138 12/18/2019   K 3.8 12/18/2019   CL 106 12/18/2019   CO2 23 12/18/2019   GLUCOSE 107 (H) 12/18/2019   BUN 13 12/18/2019   CREATININE 0.53 12/18/2019   CALCIUM 9.3 12/18/2019   PROT 7.3 04/18/2018   ALBUMIN 4.0 04/18/2018   AST 24 04/18/2018   ALT 18 04/18/2018   ALKPHOS 42 04/18/2018   BILITOT 0.3 04/18/2018   GFRNONAA >60  12/18/2019   GFRAA >60 12/18/2019    Lab Results  Component Value Date   WBC 4.9 12/18/2019   NEUTROABS 3.1 12/18/2019   HGB 12.0 12/18/2019   HCT 38.1 12/18/2019   MCV 93.8 12/18/2019   PLT 161 12/18/2019     STUDIES: MM DIAG BREAST TOMO BILATERAL  Result Date: 03/01/2020 CLINICAL DATA:  86 year old female presenting for routine annual surveillance status post left breast lumpectomy in 2018. EXAM: DIGITAL DIAGNOSTIC BILATERAL MAMMOGRAM WITH TOMO AND CAD COMPARISON:  Previous exam(s). ACR Breast Density Category b: There are scattered areas of fibroglandular density. FINDINGS: The left breast lumpectomy site is stable. No suspicious calcifications, masses or areas of distortion are seen in the bilateral breasts. Mammographic images were processed with CAD. IMPRESSION: Stable left breast lumpectomy site. No mammographic evidence of malignancy in the bilateral breasts. RECOMMENDATION: Diagnostic mammogram is suggested in 1 year. (Code:[id number]) I have discussed the findings and recommendations with the patient. If applicable, a reminder letter will be sent to the patient regarding the next appointment. BI-RADS CATEGORY  2: Benign. Electronically Signed   By: Ammie Ferrier M.D.   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  PLAN:    1.  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  She completed adjuvant XRT. Given patient's osteoporosis, she was placed on tamoxifen which she will continue for a total of 5 years completing in 2024.  Given her high risk MammaPrint, could possibly consider extending treatment for 7 to 10 years.   Patient's most recent mammogram on March 01, 2020 was reported as BI-RADS 2.  Repeat in June 2022.  Return to clinic in 6 months for routine evaluation.  2.  Osteoporosis:  Patient's most recent bone mineral density on July 20, 2019 reported T score of -2.3.  This is improved from 1 year prior where the T score was reported -2.5.  Continue Fosamax, calcium, and vitamin D.  Repeat bone mineral density in November 2021 with follow-up afterwards as above.  I spent a total of 20 minutes reviewing chart data, face-to-face evaluation with the patient, counseling and coordination of care as detailed above.     Patient expressed understanding and was in agreement with this plan. She also understands that She can call clinic at any time with any questions, concerns, or complaints.   Cancer Staging Malignant neoplasm of upper-outer quadrant of left breast in female, estrogen receptor positive ([place]) Staging form: Breast, AJCC 8th Edition - Pathologic stage from 06/27/2017: Stage IA (pT1c, pN0, cM0, G1, ER: Positive, PR: Positive, HER2: Negative) - Signed by [name], MD on 06/27/2017   [name], MD   03/06/2020 6:46 AM

## 2020-03-01 ENCOUNTER — Ambulatory Visit
Admission: RE | Admit: 2020-03-01 | Discharge: 2020-03-01 | Disposition: A | Discharge status: Home / Self Care | Payer: Medicare Other | Source: Ambulatory Visit | Attending: Oncology | Admitting: Oncology

## 2020-03-01 DIAGNOSIS — Z170 Estrogen receptor positive status [ER+]: Secondary | ICD-10-CM

## 2020-03-01 DIAGNOSIS — C50412 Malignant neoplasm of upper-outer quadrant of left female breast: Secondary | ICD-10-CM | POA: Insufficient documentation

## 2020-03-01 NOTE — Telephone Encounter (Signed)
[facility] stated that rx needed date with Margaret's signature. Added date to bottom of rx. Resent.

## 2020-03-01 NOTE — Telephone Encounter (Signed)
Spoke with Holly Rose and advised that I have sent over a hard script for new glucometer and supplies.

## 2020-03-01 NOTE — Telephone Encounter (Signed)
See other note

## 2020-03-01 NOTE — Telephone Encounter (Signed)
Melissa with [facility] on [address] called and needs clarification of meter

## 2020-03-04 ENCOUNTER — Telehealth: Payer: Self-pay | Admitting: Internal Medicine

## 2020-03-04 ENCOUNTER — Encounter: Payer: Self-pay | Admitting: Oncology

## 2020-03-04 NOTE — Telephone Encounter (Signed)
Pt  daughter called and stated that  She went to pickup mother 's glucose monitor and said that medicare need more information.

## 2020-03-04 NOTE — Telephone Encounter (Signed)
Spoke with Holly Rose regarding rx for glucometer. Advised that I sent over on Friday with corrections on prescription. Pharmacy stated all that was needed was a date with the providers signature on the rx since it was medicare. Holly Rose is going to follow up with pharmacy and let me know if anything further is needed.

## 2020-03-04 NOTE — Progress Notes (Signed)
Patient prescreened for appointment. Patient has no concerns or questions.  

## 2020-03-05 ENCOUNTER — Inpatient Hospital Stay: Payer: Medicare Other | Attending: Oncology | Admitting: Oncology

## 2020-03-05 ENCOUNTER — Other Ambulatory Visit: Payer: Self-pay

## 2020-03-05 VITALS — BP 131/79 | HR 86 | Temp 98.6°F | Resp 20 | Wt 163.0 lb

## 2020-03-05 DIAGNOSIS — Z8673 Personal history of transient ischemic attack (TIA), and cerebral infarction without residual deficits: Secondary | ICD-10-CM | POA: Diagnosis not present

## 2020-03-05 DIAGNOSIS — Z79899 Other long term (current) drug therapy: Secondary | ICD-10-CM | POA: Insufficient documentation

## 2020-03-05 DIAGNOSIS — C50412 Malignant neoplasm of upper-outer quadrant of left female breast: Secondary | ICD-10-CM | POA: Diagnosis present

## 2020-03-05 DIAGNOSIS — I252 Old myocardial infarction: Secondary | ICD-10-CM | POA: Diagnosis not present

## 2020-03-05 DIAGNOSIS — Z803 Family history of malignant neoplasm of breast: Secondary | ICD-10-CM | POA: Insufficient documentation

## 2020-03-05 DIAGNOSIS — E785 Hyperlipidemia, unspecified: Secondary | ICD-10-CM | POA: Insufficient documentation

## 2020-03-05 DIAGNOSIS — Z87891 Personal history of nicotine dependence: Secondary | ICD-10-CM | POA: Insufficient documentation

## 2020-03-05 DIAGNOSIS — E1140 Type 2 diabetes mellitus with diabetic neuropathy, unspecified: Secondary | ICD-10-CM | POA: Insufficient documentation

## 2020-03-05 DIAGNOSIS — Z79810 Long term (current) use of selective estrogen receptor modulators (SERMs): Secondary | ICD-10-CM | POA: Insufficient documentation

## 2020-03-05 DIAGNOSIS — Z7982 Long term (current) use of aspirin: Secondary | ICD-10-CM | POA: Insufficient documentation

## 2020-03-05 DIAGNOSIS — I2510 Atherosclerotic heart disease of native coronary artery without angina pectoris: Secondary | ICD-10-CM

## 2020-03-05 DIAGNOSIS — I4891 Unspecified atrial fibrillation: Secondary | ICD-10-CM | POA: Insufficient documentation

## 2020-03-05 DIAGNOSIS — Z170 Estrogen receptor positive status [ER+]: Secondary | ICD-10-CM | POA: Diagnosis not present

## 2020-03-08 ENCOUNTER — Other Ambulatory Visit: Payer: Self-pay | Admitting: Oncology

## 2020-03-13 ENCOUNTER — Ambulatory Visit: Payer: Medicare Other

## 2020-03-19 ENCOUNTER — Ambulatory Visit (INDEPENDENT_AMBULATORY_CARE_PROVIDER_SITE_OTHER): Payer: Medicare Other

## 2020-03-19 VITALS — Ht 62.0 in | Wt 163.0 lb

## 2020-03-19 DIAGNOSIS — Z0000 Encounter for general adult medical examination without abnormal findings: Secondary | ICD-10-CM | POA: Diagnosis not present

## 2020-03-19 NOTE — Patient Instructions (Addendum)
Holly Rose , Thank you for taking time to come for your Medicare Wellness Visit. I appreciate your ongoing commitment to your health goals. Please review the following plan we discussed and let me know if I can assist you in the future.   These are the goals we discussed: Goals      Patient Stated   .  Follow up with Primary Care Provider (pt-stated)      Maintain healthy lifestyle       This is a list of the screening recommended for you and due dates:  Health Maintenance  Topic Date Due  . Hemoglobin A1C  10/19/2018  . Urine Protein Check  04/19/2019  . Tetanus Vaccine  03/19/2021*  . Flu Shot  04/14/2020  . Eye exam for diabetics  11/15/2020  . Complete foot exam   12/27/2020  . DEXA scan (bone density measurement)  Completed  . COVID-19 Vaccine  Completed  . Pneumonia vaccines  Completed  *Topic was postponed. The date shown is not the original due date.    Immunizations Immunization History  Administered Date(s) Administered  . Fluad Quad(high Dose 65+) 06/19/2019  . Influenza, High Dose Seasonal PF 06/03/2016, 07/13/2017, 05/27/2018  . Influenza, Seasonal, Injecte, Preservative Fre 06/24/2007, 06/16/2011  . Influenza,inj,Quad PF,6+ Mos 06/11/2015  . Influenza-Unspecified 06/11/2015  . Pneumococcal Conjugate-13 11/16/2014  . Pneumococcal Polysaccharide-23 06/24/2007  . Td 09/14/2009  . Zoster 09/04/2013   Advanced directives: End of life planning; Advance aging; Advanced directives discussed.  Copy of current HCPOA/Living Will requested.    Conditions/risks identified: none new  Follow up in one year for your annual wellness visit   Keep all routine maintenance appointments.   Follow up 04/15/20 @ 11:30   Preventive Care 65 Years and Older, Female Preventive care refers to lifestyle choices and visits with your health care provider that can promote health and wellness. What does preventive care include?  A yearly physical exam. This is also called an  annual well check.  Dental exams once or twice a year.  Routine eye exams. Ask your health care provider how often you should have your eyes checked.  Personal lifestyle choices, including:  Daily care of your teeth and gums.  Regular physical activity.  Eating a healthy diet.  Avoiding tobacco and drug use.  Limiting alcohol use.  Practicing safe sex.  Taking low-dose aspirin every day.  Taking vitamin and mineral supplements as recommended by your health care provider. What happens during an annual well check? The services and screenings done by your health care provider during your annual well check will depend on your age, overall health, lifestyle risk factors, and family history of disease. Counseling  Your health care provider may ask you questions about your:  Alcohol use.  Tobacco use.  Drug use.  Emotional well-being.  Home and relationship well-being.  Sexual activity.  Eating habits.  History of falls.  Memory and ability to understand (cognition).  Work and work Statistician.  Reproductive health. Screening  You may have the following tests or measurements:  Height, weight, and BMI.  Blood pressure.  Lipid and cholesterol levels. These may be checked every 5 years, or more frequently if you are over 50 years old.  Skin check.  Lung cancer screening. You may have this screening every year starting at age 59 if you have a 30-pack-year history of smoking and currently smoke or have quit within the past 15 years.  Fecal occult blood test (FOBT) of the stool. You  may have this test every year starting at age 69.  Flexible sigmoidoscopy or colonoscopy. You may have a sigmoidoscopy every 5 years or a colonoscopy every 10 years starting at age 66.  Hepatitis C blood test.  Hepatitis B blood test.  Sexually transmitted disease (STD) testing.  Diabetes screening. This is done by checking your blood sugar (glucose) after you have not eaten for  a while (fasting). You may have this done every 1-3 years.  Bone density scan. This is done to screen for osteoporosis. You may have this done starting at age 65.  Mammogram. This may be done every 1-2 years. Talk to your health care provider about how often you should have regular mammograms. Talk with your health care provider about your test results, treatment options, and if necessary, the need for more tests. Vaccines  Your health care provider may recommend certain vaccines, such as:  Influenza vaccine. This is recommended every year.  Tetanus, diphtheria, and acellular pertussis (Tdap, Td) vaccine. You may need a Td booster every 10 years.  Zoster vaccine. You may need this after age 72.  Pneumococcal 13-valent conjugate (PCV13) vaccine. One dose is recommended after age 24.  Pneumococcal polysaccharide (PPSV23) vaccine. One dose is recommended after age 17. Talk to your health care provider about which screenings and vaccines you need and how often you need them. This information is not intended to replace advice given to you by your health care provider. Make sure you discuss any questions you have with your health care provider. Document Released: 09/27/2015 Document Revised: 05/20/2016 Document Reviewed: 07/02/2015 Elsevier Interactive Patient Education  2017 [place] Prevention in the Home Falls can cause injuries. They can happen to people of all ages. There are many things you can do to make your home safe and to help prevent falls. What can I do on the outside of my home?  Regularly fix the edges of walkways and driveways and fix any cracks.  Remove anything that might make you trip as you walk through a door, such as a raised step or threshold.  Trim any bushes or trees on the path to your home.  Use bright outdoor lighting.  Clear any walking paths of anything that might make someone trip, such as rocks or tools.  Regularly check to see if handrails  are loose or broken. Make sure that both sides of any steps have handrails.  Any raised decks and porches should have guardrails on the edges.  Have any leaves, snow, or ice cleared regularly.  Use sand or salt on walking paths during winter.  Clean up any spills in your garage right away. This includes oil or grease spills. What can I do in the bathroom?  Use night lights.  Install grab bars by the toilet and in the tub and shower. Do not use towel bars as grab bars.  Use non-skid mats or decals in the tub or shower.  If you need to sit down in the shower, use a plastic, non-slip stool.  Keep the floor dry. Clean up any water that spills on the floor as soon as it happens.  Remove soap buildup in the tub or shower regularly.  Attach bath mats securely with double-sided non-slip rug tape.  Do not have throw rugs and other things on the floor that can make you trip. What can I do in the bedroom?  Use night lights.  Make sure that you have a light by your bed that is  easy to reach.  Do not use any sheets or blankets that are too big for your bed. They should not hang down onto the floor.  Have a firm chair that has side arms. You can use this for support while you get dressed.  Do not have throw rugs and other things on the floor that can make you trip. What can I do in the kitchen?  Clean up any spills right away.  Avoid walking on wet floors.  Keep items that you use a lot in easy-to-reach places.  If you need to reach something above you, use a strong step stool that has a grab bar.  Keep electrical cords out of the way.  Do not use floor polish or wax that makes floors slippery. If you must use wax, use non-skid floor wax.  Do not have throw rugs and other things on the floor that can make you trip. What can I do with my stairs?  Do not leave any items on the stairs.  Make sure that there are handrails on both sides of the stairs and use them. Fix handrails  that are broken or loose. Make sure that handrails are as long as the stairways.  Check any carpeting to make sure that it is firmly attached to the stairs. Fix any carpet that is loose or worn.  Avoid having throw rugs at the top or bottom of the stairs. If you do have throw rugs, attach them to the floor with carpet tape.  Make sure that you have a light switch at the top of the stairs and the bottom of the stairs. If you do not have them, ask someone to add them for you. What else can I do to help prevent falls?  Wear shoes that:  Do not have high heels.  Have rubber bottoms.  Are comfortable and fit you well.  Are closed at the toe. Do not wear sandals.  If you use a stepladder:  Make sure that it is fully opened. Do not climb a closed stepladder.  Make sure that both sides of the stepladder are locked into place.  Ask someone to hold it for you, if possible.  Clearly mark and make sure that you can see:  Any grab bars or handrails.  First and last steps.  Where the edge of each step is.  Use tools that help you move around (mobility aids) if they are needed. These include:  Canes.  Walkers.  Scooters.  Crutches.  Turn on the lights when you go into a dark area. Replace any light bulbs as soon as they burn out.  Set up your furniture so you have a clear path. Avoid moving your furniture around.  If any of your floors are uneven, fix them.  If there are any pets around you, be aware of where they are.  Review your medicines with your doctor. Some medicines can make you feel dizzy. This can increase your chance of falling. Ask your doctor what other things that you can do to help prevent falls. This information is not intended to replace advice given to you by your health care provider. Make sure you discuss any questions you have with your health care provider. Document Released: 06/27/2009 Document Revised: 02/06/2016 Document Reviewed: 10/05/2014 Elsevier  Interactive Patient Education  2017 [employer].

## 2020-03-19 NOTE — Progress Notes (Addendum)
Subjective:   Holly Rose is a 86 y.o. female who presents for Medicare Annual (Subsequent) preventive examination.  Review of Systems    No ROS.  Medicare Wellness Virtual Visit.  Cardiac Risk Factors include: advanced age (>49mn, >>10women);diabetes mellitus     Objective:    Today's Vitals   03/19/20 1304  Weight: 163 lb (73.9 kg)  Height: 5' 2"  (1.575 m)   Body mass index is 29.81 kg/m.  Advanced Directives 03/19/2020 03/04/2020 12/18/2019 11/15/2019 09/11/2019 03/29/2019 03/24/2019  Does Patient Have a Medical Advance Directive? Yes Yes Yes No Yes Yes Yes  Type of AParamedicof ALlewellyn ParkLiving will [place]arn[place]Living will - - [place]Living will H[place]Living will HBarn[place]Living will  Does patient want to make changes to medical advance directive? No - Patient declined No - Patient declined - - No - Patient declined No - Patient declined No - Patient declined  Copy of HFort [place]in Chart? No - copy requested No - copy requested - - No - copy requested No - copy requested No - copy requested  Would patient like information on creating a medical advance directive? - - - No - Patient declined - - -    Current Medications (verified) Outpatient Encounter Medications as of 03/19/2020  Medication Sig  . Aflibercept (EYLEA) 2 MG/0.05ML SOLN 1 each by Intravitreal route as directed.   .Marland Kitchenalendronate (FOSAMAX) 70 MG tablet Take 1 tablet (70 mg total) by mouth once a week. Take with a full glass of water on an empty stomach.  .Marland Kitchenaspirin 81 MG tablet Take 81 mg by mouth daily.  . Biotin w/ Vitamins C & E (HAIR/SKIN/NAILS PO) Take 3 tablets by mouth daily. 2 in the am, 1 in the pm  . blood glucose meter kit and supplies Per patient please dispense One touch Ultra2 meter.Use meter and supplies three times a day to check blood sugar. ICD10: E11.40  . Cholecalciferol  (VITAMIN D) 50 MCG (2000 UT) CAPS Take 4,000 Units by mouth daily.  . furosemide (LASIX) 20 MG tablet Take 20 mg by mouth daily as needed for fluid.   . Magnesium 200 MG TABS Take 200 mg by mouth 2 (two) times a day.  . meclizine (ANTIVERT) 12.5 MG tablet Take 1 tablet (12.5 mg total) by mouth 2 (two) times daily as needed for dizziness.  . metFORMIN (GLUCOPHAGE-XR) 500 MG 24 hr tablet Take 2 tablets by mouth twice daily  . metoprolol (LOPRESSOR) 50 MG tablet Take 25 mg by mouth 2 (two) times daily.   . Multiple Vitamins-Minerals (PRESERVISION AREDS 2 PO) Take 1 tablet by mouth 2 (two) times daily.   .Glory RosebushULTRA test strip USE 1 STRIP TO CHECK GLUCOSE THREE TIMES DAILY  . Polyethyl Glycol-Propyl Glycol (SYSTANE OP) Apply 1 drop to eye 2 (two) times daily.  . Probiotic Product (PROBIOTIC DAILY PO) Take by mouth.  . tamoxifen (NOLVADEX) 20 MG tablet Take 1 tablet by mouth once daily  . vitamin B-12 (CYANOCOBALAMIN) 500 MCG tablet Take 500 mcg by mouth 2 (two) times a day.   No facility-administered encounter medications on file as of 03/19/2020.    Allergies (verified) Lyrica [pregabalin], Neurontin [gabapentin], and Bactrim [sulfamethoxazole-trimethoprim]   History: Past Medical History:  Diagnosis Date  . Allergy   . Anemia   . Arthritis   . Atrial fibrillation ([place] 2004  . Breast cancer, left ([place] 10/2017  Lumpectomy and rad tx's.   Marland Kitchen CAD (coronary artery disease)   . Complication of anesthesia    hard to wake up with general anesthesia  . Diabetes ([place])   . Dysrhythmia    A-fib  . Heart attack ([place]) 2008  . Heart murmur   . History of kidney stones   . History of sick sinus syndrome    s/p pacemaker placement  . Hyperlipidemia   . Macular degeneration   . Neuropathy   . Neuropathy   . Pacemaker 06/2010  . Personal history of radiation therapy 11/2017   LEFT lumpectomy   . Squamous cell skin cancer   . TIA (transient ischemic attack)    Past Surgical History:    Procedure Laterality Date  . ABDOMINAL HYSTERECTOMY  1960's  . APPENDECTOMY  1947  . [place]  . BREAST BIOPSY Left 12/17/2016   SINGLE FRAGMENT OF ATYPICAL EPITHELIAL CELLS  . BREAST BIOPSY Left 04/08/2017   [facility]  . BREAST EXCISIONAL BIOPSY Left 01/18/1986   Benign microcalcifications, [employer].  Marland Kitchen BREAST LUMPECTOMY Left 05/26/2017   13 mm,T1c, N0; ER/ PR+, Her 2 neu negative, HIGH RISK by Mammoprint ;  Surgeon: [name], MD;  Location: [facility] ORS;  Service: General;  Laterality: Left;  . BREAST SURGERY  1988  . CARDIAC CATHETERIZATION  1989  . CATARACT EXTRACTION  1997 and 1998  . COLONOSCOPY WITH PROPOFOL N/A 09/12/2015   Procedure: COLONOSCOPY WITH PROPOFOL;  Surgeon: [name], MD;  Location: [facility] ENDOSCOPY;  Service: Endoscopy;  Laterality: N/A;  . IMPLANTABLE CARDIOVERTER DEFIBRILLATOR (ICD) GENERATOR CHANGE Left 03/29/2019   Procedure: PACEMAKER CHANGE OUT;  Surgeon: [name], MD;  Location: [facility] ORS;  Service: Cardiovascular;  Laterality: Left;  . KIDNEY STONE SURGERY  2018  . MOHS SURGERY  2019   forehead  . pace maker  2008  . [place]  . TONSILLECTOMY AND ADENOIDECTOMY  1950"s   Family History  Problem Relation Age of Onset  . Stroke Mother   . Diabetes Mother   . Cancer Maternal Aunt        Breast Cancer  . Breast cancer Maternal Aunt   . Arthritis Maternal Grandmother   . Cancer Maternal Aunt        Lung Cancer  . Diabetes Son   . Cancer Son   . Kidney disease Neg Hx   . Bladder Cancer Neg Hx    Social History   Socioeconomic History  . Marital status: Married    Spouse name: [name]  . Number of children: 2  . Years of education: Not on file  . Highest education level: Not on file  Occupational History  . Not on file  Tobacco Use  . Smoking status: Former Smoker    Years: 5.00    Quit date: 09/14/1968    Years since quitting: 51.5  . Smokeless tobacco: Never Used  . Tobacco  comment: quit 1970  Vaping Use  . Vaping Use: Never used  Substance and Sexual Activity  . Alcohol use: No    Alcohol/week: 0.0 standard drinks  . Drug use: No  . Sexual activity: Not on file  Other Topics Concern  . Not on file  Social History Narrative   Live home with husband in [place]   Social Determinants of Health   Financial Resource Strain:   . Difficulty of Paying Living Expenses:   Food Insecurity:   .  Worried About Charity fundraiser in the Last Year:   . Arboriculturist in the Last Year:   Transportation Needs:   . Film/video editor (Medical):   Marland Kitchen Lack of Transportation (Non-Medical):   Physical Activity:   . Days of Exercise per Week:   . Minutes of Exercise per Session:   Stress:   . Feeling of Stress :   Social Connections:   . Frequency of Communication with Friends and Family:   . Frequency of Social Gatherings with Friends and Family:   . Attends Religious Services:   . Active Member of Clubs or Organizations:   . Attends Archivist Meetings:   Marland Kitchen Marital Status:     Tobacco Counseling Counseling given: Not Answered Comment: quit 1970   Clinical Intake:  Pre-visit preparation completed: Yes        Diabetes: Yes (Followed by PMD)  How often do you need to have someone help you when you read instructions, pamphlets, or other written materials from your doctor or pharmacy?: 1 - Never  Interpreter Needed?: No      Activities of Daily Living In your present state of health, do you have any difficulty performing the following activities: 03/19/2020 03/24/2019  Hearing? N -  Vision? Y -  Difficulty concentrating or making decisions? N -  Walking or climbing stairs? Y N  Comment Unsteady gait -  Dressing or bathing? N -  Doing errands, shopping? Y N  Comment She does not drive Facilities manager and eating ? N -  Using the Toilet? N -  In the past six months, have you accidently leaked urine? N -  Do you have  problems with loss of bowel control? N -  Managing your Medications? Y -  Comment Daughter in law assist -  Managing your Finances? Y -  Comment Daughter in law assist -  Housekeeping or managing your Housekeeping? Y -  Comment Weekly maid assist -  Some recent data might be hidden    Patient Care Team: [name], MD as PCP - General (Internal Medicine) [name], MD (Internal Medicine) Bary Castilla, Forest Gleason, MD (General Surgery) [name], MD as Consulting Physician (Oncology)  Indicate any recent Medical Services you may have received from other than [facility] providers in the past year (date may be approximate).     Assessment:   This is a routine wellness examination for Dane.  I connected with Tasha today by telephone and verified that I am speaking with the correct person using two identifiers. Location patient: home Location provider: work Persons participating in the virtual visit: patient, Marine scientist.    I discussed the limitations, risks, security and privacy concerns of performing an evaluation and management service by telephone and the availability of in person appointments. The patient expressed understanding and verbally consented to this telephonic visit.    Interactive audio and video telecommunications were attempted between this provider and patient, however failed, due to patient having technical difficulties OR patient did not have access to video capability.  We continued and completed visit with audio only.  Some vital signs may be absent or patient reported.   Hearing/Vision screen  Hearing Screening   125Hz  250Hz  500Hz  1000Hz  2000Hz  3000Hz  4000Hz  6000Hz  8000Hz   Right ear:           Left ear:           Comments: Patient is able to hear conversational tones without difficulty.  No  issues reported.  Vision Screening Comments: Follow[facility] Wears reader lenses Macular Degeneration; injections every 7 weeks Visual acuity  not assessed, virtual visit.  They have seen their ophthalmologist in the last 12 months.   Dietary issues and exercise activities discussed: Healthy diet Good water intake  Current Exercise Habits: The patient does not participate in regular exercise at present  Goals      Patient Stated   .  Follow up with Primary Care Provider (pt-stated)      Maintain healthy lifestyle      Depression Screen PHQ 2/9 Scores 03/19/2020 10/16/2019 03/13/2019 09/29/2017 04/29/2017 08/05/2016 06/03/2016  PHQ - 2 Score 0 0 0 0 0 0 0  PHQ- 9 Score - 0 - - - - -    Fall Risk Fall Risk  03/19/2020 10/16/2019 03/13/2019 09/29/2017 04/29/2017  Falls in the past year? 0 1 0 No No  Comment None since last reported more than 6 months ago - - - -  Number falls in past yr: 0 1 - - -  Injury with Fall? - 1 - - -  Risk for fall due to : Impaired balance/gait - - - -  Follow up Falls evaluation completed Falls evaluation completed - - -   Handrails in use when climbing stairs? Yes  Home free of loose throw rugs in walkways, pet beds, electrical cords, etc? Yes  Adequate lighting in your home to reduce risk of falls? Yes   ASSISTIVE DEVICES UTILIZED TO PREVENT FALLS:  Life alert? Yes  Use of a cane, walker or w/c? Yes , rolling walker Grab bars in the bathroom? Yes  Shower chair or bench in shower? Yes  Elevated toilet seat or a handicapped toilet? Yes   TIMED UP AND GO:  Was the test performed? No . Virtual visit.  Cognitive Function:     6CIT Screen 03/19/2020 03/13/2019 04/29/2017  What Year? 0 points 0 points 0 points  What month? 0 points 0 points 0 points  What time? - 0 points 0 points  Count back from 20 - 0 points 0 points  Months in reverse - 0 points 0 points  Repeat phrase - 0 points 0 points  Total Score - 0 0   Immunizations Immunization History  Administered Date(s) Administered  . Fluad Quad(high Dose 65+) 06/19/2019  . Influenza, High Dose Seasonal PF 06/03/2016, 07/13/2017, 05/27/2018    . Influenza, Seasonal, Injecte, Preservative Fre 06/24/2007, 06/16/2011  . Influenza,inj,Quad PF,6+ Mos 06/11/2015  . Influenza-Unspecified 06/11/2015  . PFIZER SARS-COV-2 Vaccination 09/19/2019, 10/18/2019  . Pneumococcal Conjugate-13 11/16/2014  . Pneumococcal Polysaccharide-23 06/24/2007  . Td 09/14/2009  . Zoster 09/04/2013   TDAP status: Due, Education has been provided regarding the importance of this vaccine. Advised may receive this vaccine at local pharmacy or Health Dept. Aware to provide a copy of the vaccination record if obtained from local pharmacy or Health Dept. Verbalized acceptance and understanding. Deferred.  Health Maintenance Health Maintenance  Topic Date Due  . HEMOGLOBIN A1C  10/19/2018  . URINE MICROALBUMIN  04/19/2019  . TETANUS/TDAP  03/19/2021 (Originally 09/15/2019)  . INFLUENZA VACCINE  04/14/2020  . OPHTHALMOLOGY EXAM  11/15/2020  . FOOT EXAM  12/27/2020  . DEXA SCAN  Completed  . COVID-19 Vaccine  Completed  . PNA vac Low Risk Adult  Completed   Dental Screening: Recommended annual dental exams for proper oral hygiene  Community Resource Referral / Chronic Care Management: CRR required this visit?  No  CCM required this visit?  No      Plan:   Keep all routine maintenance appointments.   Follow up 04/15/20 @ 11:30  I have personally reviewed and noted the following in the patient's chart:   . Medical and social history . Use of alcohol, tobacco or illicit drugs  . Current medications and supplements . Functional ability and status . Nutritional status . Physical activity . Advanced directives . List of other physicians . Hospitalizations, surgeries, and ER visits in previous 12 months . Vitals . Screenings to include cognitive, depression, and falls . Referrals and appointments  In addition, I have reviewed and discussed with patient certain preventive protocols, quality metrics, and best practice recommendations. A written  personalized care plan for preventive services as well as general preventive health recommendations were provided to patient via mychart.     Varney Biles, LPN   11/19/8873    Reviewed above information.  Agree with assessment and plan.    Dr [name]

## 2020-03-20 ENCOUNTER — Encounter: Payer: Self-pay | Admitting: Internal Medicine

## 2020-03-20 ENCOUNTER — Other Ambulatory Visit: Payer: Self-pay

## 2020-03-20 MED ORDER — GLUCOSE BLOOD VI STRP: ORAL_STRIP | 12 refills | Status: DC

## 2020-04-01 ENCOUNTER — Encounter: Payer: Self-pay | Admitting: Podiatry

## 2020-04-01 ENCOUNTER — Other Ambulatory Visit: Payer: Self-pay

## 2020-04-01 ENCOUNTER — Ambulatory Visit (INDEPENDENT_AMBULATORY_CARE_PROVIDER_SITE_OTHER): Payer: Medicare Other | Admitting: Podiatry

## 2020-04-01 DIAGNOSIS — Q828 Other specified congenital malformations of skin: Secondary | ICD-10-CM | POA: Diagnosis not present

## 2020-04-01 DIAGNOSIS — B351 Tinea unguium: Secondary | ICD-10-CM

## 2020-04-01 DIAGNOSIS — M79676 Pain in unspecified toe(s): Secondary | ICD-10-CM | POA: Diagnosis not present

## 2020-04-01 DIAGNOSIS — E1142 Type 2 diabetes mellitus with diabetic polyneuropathy: Secondary | ICD-10-CM

## 2020-04-01 NOTE — Progress Notes (Signed)
She presents today chief complaint of painfully elongated toenails and calluses to the plantar aspect of the forefoot bilaterally.  Objective: Vital signs are stable alert oriented x3 no change in diabetic peripheral neuropathy.  Toenails are long thick yellow dystrophic-like mycotic bilateral foot.  Assessment: Pain in limb secondary to painful reactive hyper keratomas and painful elongated nails as well as diabetic peripheral neuropathy.  Plan: Debrided all reactive hyperkeratotic tissue debrided nails bilaterally. Follow-up with [place] in 3 months

## 2020-04-08 ENCOUNTER — Encounter: Payer: Self-pay | Admitting: Internal Medicine

## 2020-04-10 ENCOUNTER — Telehealth: Payer: Self-pay

## 2020-04-10 NOTE — Telephone Encounter (Signed)
Daughter in law called in stating that she feels like something neurological may be going on with patient. Over the last week, she has had episodes thinking that someone has been in her house, messing up her kitchen, very emotional and agitated. Today her hair appointment is scheduled for 2:00, she was ready to go at 10:30 and was upset with her husband because he would not take her. Daughter in law found her in the bathroom with the lights off and she refused to let her cut the lights on for her. Daughter in law stated that she has not noticed anything else acute going on and patient has not complained of anything but this is very out of character for her and daughter in law is concerned. Advised that given her recent confusion, she should be taken tor urgent care in case any scans are needed for further w/up. Patient has a hx of UTI but no history of confusion with them. Daughter in law is going to try to take pt to the [facility] urgent care this PM since Dr [name] is not in the office. Pt has f/u with Dr [name] on Monday

## 2020-04-10 NOTE — Telephone Encounter (Signed)
See phone note

## 2020-04-15 ENCOUNTER — Ambulatory Visit
Admission: RE | Admit: 2020-04-15 | Discharge: 2020-04-15 | Disposition: A | Discharge status: Home / Self Care | Payer: Medicare Other | Source: Ambulatory Visit | Attending: Internal Medicine | Admitting: Internal Medicine

## 2020-04-15 ENCOUNTER — Other Ambulatory Visit: Payer: Self-pay

## 2020-04-15 ENCOUNTER — Ambulatory Visit (INDEPENDENT_AMBULATORY_CARE_PROVIDER_SITE_OTHER): Payer: Medicare Other | Admitting: Internal Medicine

## 2020-04-15 ENCOUNTER — Encounter: Payer: Self-pay | Admitting: Internal Medicine

## 2020-04-15 VITALS — BP 132/80 | HR 92 | Temp 98.0°F | Ht 62.01 in | Wt 153.0 lb

## 2020-04-15 DIAGNOSIS — R4182 Altered mental status, unspecified: Secondary | ICD-10-CM | POA: Insufficient documentation

## 2020-04-15 DIAGNOSIS — E7800 Pure hypercholesterolemia, unspecified: Secondary | ICD-10-CM

## 2020-04-15 DIAGNOSIS — S0990XA Unspecified injury of head, initial encounter: Secondary | ICD-10-CM

## 2020-04-15 DIAGNOSIS — C50412 Malignant neoplasm of upper-outer quadrant of left female breast: Secondary | ICD-10-CM | POA: Diagnosis not present

## 2020-04-15 DIAGNOSIS — I482 Chronic atrial fibrillation, unspecified: Secondary | ICD-10-CM | POA: Diagnosis not present

## 2020-04-15 DIAGNOSIS — R413 Other amnesia: Secondary | ICD-10-CM | POA: Diagnosis not present

## 2020-04-15 DIAGNOSIS — E559 Vitamin D deficiency, unspecified: Secondary | ICD-10-CM

## 2020-04-15 DIAGNOSIS — I2510 Atherosclerotic heart disease of native coronary artery without angina pectoris: Secondary | ICD-10-CM | POA: Diagnosis not present

## 2020-04-15 DIAGNOSIS — E1165 Type 2 diabetes mellitus with hyperglycemia: Secondary | ICD-10-CM

## 2020-04-15 DIAGNOSIS — Z170 Estrogen receptor positive status [ER+]: Secondary | ICD-10-CM

## 2020-04-15 DIAGNOSIS — N390 Urinary tract infection, site not specified: Secondary | ICD-10-CM

## 2020-04-15 DIAGNOSIS — D509 Iron deficiency anemia, unspecified: Secondary | ICD-10-CM

## 2020-04-15 LAB — URINALYSIS, ROUTINE W REFLEX MICROSCOPIC
Bilirubin Urine: NEGATIVE
Ketones, ur: 15 — AB
Nitrite: POSITIVE — AB
Specific Gravity, Urine: 1.025 (ref 1.000–1.030)
Urine Glucose: NEGATIVE
Urobilinogen, UA: 0.2 (ref 0.0–1.0)
pH: 5.5 (ref 5.0–8.0)

## 2020-04-15 NOTE — Progress Notes (Signed)
Patient ID: Holly Rose, female   DOB: July 29, 1930, 86 y.o.   MRN: [id number]   Subjective:    Patient ID: Holly Rose, female    DOB: [birth year], 86 y.o.   MRN: [id number]  HPI This visit occurred during the SARS-CoV-2 public health emergency.  Safety protocols were in place, including screening questions prior to the visit, additional usage of staff PPE, and extensive cleaning of exam room while observing appropriate contact time as indicated for disinfecting solutions.  Patient here for as a work in with concerns regarding some changes in her mental status.  She is accompanied by her husband and her daughter-n-law.  History obtained from all of them.  Last week, she hit her head on what sounds like the door facing.  Husband reports she was "afraid" after this and wanted to lay down in bed with him.  Reports that he noticed some increased confusion after this.  Her daughter-n-law reports some confusion started prior to the injury.  Husband states she reported - that this did not look like her house, etc.  He reported that this has improved and she is more like her normal self today.  She denies any pain.  No headache.  Some unsteadiness at times.  No syncope or near syncopal episodes.  She denies dizziness.  No chest pain or sob. No increased cough or congestion reported.   No acid reflux or abdominal pain reported.  Bowels moving.  Sugars overall doing better.  Eating.  No urinary symptoms, specifically no dysuria or increased frequency.     Past Medical History:  Diagnosis Date  . Allergy   . Anemia   . Arthritis   . Atrial fibrillation ([place]) 2004  . Breast cancer, left ([place]) 10/2017   Lumpectomy and rad tx's.   Marland Kitchen CAD (coronary artery disease)   . Complication of anesthesia    hard to wake up with general anesthesia  . Diabetes ([place])   . Dysrhythmia    A-fib  . Heart attack ([place]) 2008  . Heart murmur   . History of kidney stones   . History of sick sinus syndrome    s/p  pacemaker placement  . Hyperlipidemia   . Macular degeneration   . Neuropathy   . Neuropathy   . Pacemaker 06/2010  . Personal history of radiation therapy 11/2017   LEFT lumpectomy   . Squamous cell skin cancer   . TIA (transient ischemic attack)    Past Surgical History:  Procedure Laterality Date  . ABDOMINAL HYSTERECTOMY  1960's  . APPENDECTOMY  1947  . [place]  . BREAST BIOPSY Left 12/17/2016   SINGLE FRAGMENT OF ATYPICAL EPITHELIAL CELLS  . BREAST BIOPSY Left 04/08/2017   [facility]  . BREAST EXCISIONAL BIOPSY Left 01/18/1986   Benign microcalcifications, [employer].  Marland Kitchen BREAST LUMPECTOMY Left 05/26/2017   13 mm,T1c, N0; ER/ PR+, Her 2 neu negative, HIGH RISK by Mamm[facility]int ;  Surgeon: [name], MD;  Location: [facility] ORS;  Service: General;  Laterality: Left;  . BREAST SURGERY  1988  . CARDIAC CATHETERIZATION  1989  . CATARACT EXTRACTION  1997 and 1998  . COLONOSCOPY WITH PROPOFOL N/A 09/12/2015   Procedure: COLONOSCOPY WITH PROPOFOL;  Surgeon: [name], MD;  Location: [facility] ENDOSCOPY;  Service: Endoscopy;  Laterality: N/A;  . IMPLANTABLE CARDIOVERTER DEFIBRILLATOR (ICD) GENERATOR CHANGE Left 03/29/2019   Procedure: PACEMAKER CHANGE OUT;  Surgeon: [name], MD;  Location: [facility]  ORS;  Service: Cardiovascular;  Laterality: Left;  . KIDNEY STONE SURGERY  2018  . MOHS SURGERY  2019   forehead  . pace maker  2008  . [place]  . TONSILLECTOMY AND ADENOIDECTOMY  1950"s   Family History  Problem Relation Age of Onset  . Stroke Mother   . Diabetes Mother   . Cancer Maternal Aunt        Breast Cancer  . Breast cancer Maternal Aunt   . Arthritis Maternal Grandmother   . Cancer Maternal Aunt        Lung Cancer  . Diabetes Son   . Cancer Son   . Kidney disease Neg Hx   . Bladder Cancer Neg Hx    Social History   Socioeconomic History  . Marital status: Married    Spouse name: Holly Rose  . Number of children: 2    . Years of education: Not on file  . Highest education level: Not on file  Occupational History  . Not on file  Tobacco Use  . Smoking status: Former Smoker    Years: 5.00    Quit date: 09/14/1968    Years since quitting: 51.6  . Smokeless tobacco: Never Used  . Tobacco comment: quit 1970  Vaping Use  . Vaping Use: Never used  Substance and Sexual Activity  . Alcohol use: No    Alcohol/week: 0.0 standard drinks  . Drug use: No  . Sexual activity: Not on file  Other Topics Concern  . Not on file  Social History Narrative   Live home with husband in [place]   Social Determinants of Health   Financial Resource Strain:   . Difficulty of Paying Living Expenses:   Food Insecurity:   . Worried About Charity fundraiser in the Last Year:   . Arboriculturist in the Last Year:   Transportation Needs:   . Film/video editor (Medical):   Marland Kitchen Lack of Transportation (Non-Medical):   Physical Activity:   . Days of Exercise per Week:   . Minutes of Exercise per Session:   Stress:   . Feeling of Stress :   Social Connections:   . Frequency of Communication with Friends and Family:   . Frequency of Social Gatherings with Friends and Family:   . Attends Religious Services:   . Active Member of Clubs or Organizations:   . Attends Archivist Meetings:   Marland Kitchen Marital Status:     Outpatient Encounter Medications as of 04/15/2020  Medication Sig  . Aflibercept (EYLEA) 2 MG/0.05ML SOLN 1 each by Intravitreal route as directed.   Marland Kitchen alendronate (FOSAMAX) 70 MG tablet Take 1 tablet (70 mg total) by mouth once a week. Take with a full glass of water on an empty stomach.  Marland Kitchen aspirin 81 MG tablet Take 81 mg by mouth daily.  . Biotin w/ Vitamins C & E (HAIR/SKIN/NAILS PO) Take 3 tablets by mouth daily. 2 in the am, 1 in the pm  . blood glucose meter kit and supplies Per patient please dispense One touch Ultra2 meter.Use meter and supplies three times a day to check blood  sugar. ICD10: E11.40  . Blood Glucose Monitoring Suppl (FREESTYLE LITE) DEVI 2 (two) times daily.  . Cholecalciferol (VITAMIN D) 50 MCG (2000 UT) CAPS Take 4,000 Units by mouth daily.  . furosemide (LASIX) 20 MG tablet Take 20 mg by mouth daily as needed for fluid.   Marland Kitchen glucose blood  test strip Use as instructed to check sugars twice daily. Dx E11.9  . Lancets (FREESTYLE) lancets 2 (two) times daily.  . Magnesium 200 MG TABS Take 200 mg by mouth 2 (two) times a day.  . meclizine (ANTIVERT) 12.5 MG tablet Take 1 tablet (12.5 mg total) by mouth 2 (two) times daily as needed for dizziness.  . metFORMIN (GLUCOPHAGE-XR) 500 MG 24 hr tablet Take 2 tablets by mouth twice daily  . metoprolol (LOPRESSOR) 50 MG tablet Take 25 mg by mouth 2 (two) times daily.   . Multiple Vitamins-Minerals (PRESERVISION AREDS 2 PO) Take 1 tablet by mouth 2 (two) times daily.   Vladimir Faster Glycol-Propyl Glycol (SYSTANE OP) Apply 1 drop to eye 2 (two) times daily.  . Probiotic Product (PROBIOTIC DAILY PO) Take by mouth.  . tamoxifen (NOLVADEX) 20 MG tablet Take 1 tablet by mouth once daily  . vitamin B-12 (CYANOCOBALAMIN) 500 MCG tablet Take 500 mcg by mouth 2 (two) times a day.   No facility-administered encounter medications on file as of 04/15/2020.    Review of Systems  Constitutional: Negative for appetite change.       Weight is down from last check.  States she is eating.   HENT: Negative for congestion and sinus pressure.   Respiratory: Negative for cough, chest tightness and shortness of breath.   Cardiovascular: Negative for chest pain, palpitations and leg swelling.  Gastrointestinal: Negative for abdominal pain, diarrhea, nausea and vomiting.  Genitourinary: Negative for difficulty urinating and dysuria.  Musculoskeletal: Negative for joint swelling and myalgias.  Skin: Negative for color change and rash.  Neurological: Negative for dizziness and headaches.  Psychiatric/Behavioral: Negative for agitation  and dysphoric mood.       Objective:    Physical Exam Constitutional:      General: She is not in acute distress.    Appearance: Normal appearance.  HENT:     Head: Normocephalic and atraumatic.     Right Ear: External ear normal.     Left Ear: External ear normal.  Eyes:     General: No scleral icterus.       Right eye: No discharge.        Left eye: No discharge.     Conjunctiva/sclera: Conjunctivae normal.  Neck:     Thyroid: No thyromegaly.  Cardiovascular:     Rate and Rhythm: Normal rate and regular rhythm.  Pulmonary:     Effort: No respiratory distress.     Breath sounds: Normal breath sounds. No wheezing.  Abdominal:     General: Bowel sounds are normal.     Palpations: Abdomen is soft.     Tenderness: There is no abdominal tenderness.  Musculoskeletal:        General: No swelling or tenderness.     Cervical back: Neck supple. No tenderness.  Lymphadenopathy:     Cervical: No cervical adenopathy.  Skin:    Findings: No erythema or rash.  Neurological:     Mental Status: She is alert.  Psychiatric:        Mood and Affect: Mood normal.        Behavior: Behavior normal.     BP 132/80 (BP Location: Left Arm, Patient Position: Sitting)   Pulse 92   Temp 98 F (36.7 C)   Ht 5' 2.01" (1.575 m)   Wt 153 lb (69.4 kg)   LMP  (LMP Unknown)   SpO2 97%   BMI 27.98 kg/m  Wt Readings from Last 3 Encounters:  04/16/20 153 lb (69.4 kg)  04/15/20 153 lb (69.4 kg)  03/19/20 163 lb (73.9 kg)     Lab Results  Component Value Date   WBC 3.6 04/15/2020   HGB 11.4 04/15/2020   HCT 36.8 04/15/2020   PLT 166 04/15/2020   GLUCOSE 152 (H) 04/15/2020   CHOL 182 04/18/2018   TRIG 89 04/18/2018   HDL 63 04/18/2018   LDLCALC 101 (H) 04/18/2018   ALT 29 04/15/2020   AST 32 04/15/2020   NA 137 04/15/2020   K 4.1 04/15/2020   CL 103 04/15/2020   CREATININE 0.65 04/15/2020   BUN 9 04/15/2020   CO2 23 04/15/2020   TSH 1.860 04/15/2020   INR 1.0 12/18/2019    HGBA1C 6.6 (H) 04/15/2020   MICROALBUR 65.4 (H) 03/09/2016    MM DIAG BREAST TOMO BILATERAL  Result Date: 03/01/2020 CLINICAL DATA:  86 year old female presenting for routine annual surveillance status post left breast lumpectomy in 2018. EXAM: DIGITAL DIAGNOSTIC BILATERAL MAMMOGRAM WITH TOMO AND CAD COMPARISON:  Previous exam(s). ACR Breast Density Category b: There are scattered areas of fibroglandular density. FINDINGS: The left breast lumpectomy site is stable. No suspicious calcifications, masses or areas of distortion are seen in the bilateral breasts. Mammographic images were processed with CAD. IMPRESSION: Stable left breast lumpectomy site. No mammographic evidence of malignancy in the bilateral breasts. RECOMMENDATION: Diagnostic mammogram is suggested in 1 year. (Code:[id number]) I have discussed the findings and recommendations with the patient. If applicable, a reminder letter will be sent to the patient regarding the next appointment. BI-RADS CATEGORY  2: Benign. Electronically Signed   By: Ammie Ferrier M.D.   On: 03/01/2020 11:47       Assessment & Plan:   Problem List Items Addressed This Visit    Atrial fibrillation, chronic    Followed by cardiology.  Just had pacemaker changed.  Sees Dr Ubaldo Glassing.       CAD (coronary artery disease)    Currently asymptomatic.  Continue risk factor modification.       Change in mental status    Unclear etiology.  Will check head CT as outlined.  Check routine labs including urine to confirm no infection or metabolic etiology.  Discussed further w/up.  Desire neurology referral.  No focal findings on exam today.        Relevant Orders   CT Head Wo Contrast (Completed)   Urinalysis, Routine w reflex microscopic (Completed)   Urine Culture   TSH (Completed)   Frequent urinary tract infections    Has a history of urinary tract infections.  Will check urine to confirm no infection.  Denies any dysuria or increased frequency.  Hold abx until  can review culture results.        Head injury    Head injury last week.  Husband reports some change after the injury as outlined.  Getting back to normal now.  No headache.  No dizziness reported.  Check head CT.        Relevant Orders   CT Head Wo Contrast (Completed)   Hypercholesterolemia    Follow lipid panel.  Low cholesterol diet.  Discuss statin medication once above issues sorted through.       Relevant Orders   Hepatic function panel (Completed)   Iron deficiency anemia    Follow cbc and iron studies.        Relevant Orders   CBC with Differential/Platelet (Completed)   Malignant neoplasm of upper-outer quadrant of left breast  in female, estrogen receptor positive ([place])    On tamoxifen.  S/p XRT.  Follow.        Type II diabetes mellitus ([place])    Has been seeing endocrinology.  Last a1c 6.6.  No reports of low blood sugar.  Has been eating.  Continues on metformin.  Follow sugars.  Check met b and a1c.       Relevant Orders   Hemoglobin A1c (Completed)   Basic metabolic panel (Completed)   Vitamin D deficiency, unspecified - Primary   Relevant Orders   VITAMIN D 25 Hydroxy (Vit-D Deficiency, Fractures) (Completed)    Other Visit Diagnoses    Memory change          I spent 40 minutes with the patient and more than 50% of the time was spent in consultation regarding the above.  Time spent discussing her symptoms and concerns.  Time also spent discussing plan for further evaluation and treatment.   [name], MD

## 2020-04-16 ENCOUNTER — Emergency Department: Payer: Medicare Other

## 2020-04-16 ENCOUNTER — Encounter: Payer: Self-pay | Admitting: Emergency Medicine

## 2020-04-16 ENCOUNTER — Other Ambulatory Visit: Payer: Self-pay | Admitting: Internal Medicine

## 2020-04-16 ENCOUNTER — Other Ambulatory Visit: Payer: Self-pay

## 2020-04-16 DIAGNOSIS — R0789 Other chest pain: Secondary | ICD-10-CM | POA: Diagnosis not present

## 2020-04-16 DIAGNOSIS — Z79899 Other long term (current) drug therapy: Secondary | ICD-10-CM | POA: Diagnosis not present

## 2020-04-16 DIAGNOSIS — I4891 Unspecified atrial fibrillation: Secondary | ICD-10-CM | POA: Diagnosis not present

## 2020-04-16 DIAGNOSIS — I2510 Atherosclerotic heart disease of native coronary artery without angina pectoris: Secondary | ICD-10-CM | POA: Diagnosis not present

## 2020-04-16 DIAGNOSIS — Z20822 Contact with and (suspected) exposure to covid-19: Secondary | ICD-10-CM | POA: Insufficient documentation

## 2020-04-16 DIAGNOSIS — Z7982 Long term (current) use of aspirin: Secondary | ICD-10-CM | POA: Diagnosis not present

## 2020-04-16 DIAGNOSIS — I482 Chronic atrial fibrillation, unspecified: Secondary | ICD-10-CM | POA: Insufficient documentation

## 2020-04-16 DIAGNOSIS — Z85828 Personal history of other malignant neoplasm of skin: Secondary | ICD-10-CM | POA: Diagnosis not present

## 2020-04-16 DIAGNOSIS — I491 Atrial premature depolarization: Secondary | ICD-10-CM | POA: Diagnosis not present

## 2020-04-16 DIAGNOSIS — I214 Non-ST elevation (NSTEMI) myocardial infarction: Secondary | ICD-10-CM | POA: Diagnosis not present

## 2020-04-16 DIAGNOSIS — E1140 Type 2 diabetes mellitus with diabetic neuropathy, unspecified: Secondary | ICD-10-CM | POA: Insufficient documentation

## 2020-04-16 DIAGNOSIS — Z87891 Personal history of nicotine dependence: Secondary | ICD-10-CM | POA: Diagnosis not present

## 2020-04-16 DIAGNOSIS — R079 Chest pain, unspecified: Secondary | ICD-10-CM | POA: Diagnosis not present

## 2020-04-16 DIAGNOSIS — R413 Other amnesia: Secondary | ICD-10-CM

## 2020-04-16 DIAGNOSIS — Z7984 Long term (current) use of oral hypoglycemic drugs: Secondary | ICD-10-CM | POA: Insufficient documentation

## 2020-04-16 DIAGNOSIS — R4182 Altered mental status, unspecified: Secondary | ICD-10-CM

## 2020-04-16 DIAGNOSIS — E785 Hyperlipidemia, unspecified: Secondary | ICD-10-CM | POA: Insufficient documentation

## 2020-04-16 DIAGNOSIS — I499 Cardiac arrhythmia, unspecified: Secondary | ICD-10-CM | POA: Diagnosis not present

## 2020-04-16 DIAGNOSIS — Z853 Personal history of malignant neoplasm of breast: Secondary | ICD-10-CM | POA: Diagnosis not present

## 2020-04-16 DIAGNOSIS — Z950 Presence of cardiac pacemaker: Secondary | ICD-10-CM | POA: Diagnosis not present

## 2020-04-16 LAB — HEPATIC FUNCTION PANEL
ALT: 29 IU/L (ref 0–32)
AST: 32 IU/L (ref 0–40)
Albumin: 3.6 g/dL (ref 3.6–4.6)
Alkaline Phosphatase: 42 IU/L — ABNORMAL LOW (ref 48–121)
Bilirubin Total: 0.3 mg/dL (ref 0.0–1.2)
Bilirubin, Direct: 0.14 mg/dL (ref 0.00–0.40)
Total Protein: 7.4 g/dL (ref 6.0–8.5)

## 2020-04-16 LAB — CBC WITH DIFFERENTIAL/PLATELET
Basophils Absolute: 0 10*3/uL (ref 0.0–0.2)
Basos: 1 %
EOS (ABSOLUTE): 0 10*3/uL (ref 0.0–0.4)
Eos: 1 %
Hematocrit: 36.8 % (ref 34.0–46.6)
Hemoglobin: 11.4 g/dL (ref 11.1–15.9)
Immature Grans (Abs): 0 10*3/uL (ref 0.0–0.1)
Immature Granulocytes: 0 %
Lymphocytes Absolute: 1.2 10*3/uL (ref 0.7–3.1)
Lymphs: 32 %
MCH: 28.1 pg (ref 26.6–33.0)
MCHC: 31 g/dL — ABNORMAL LOW (ref 31.5–35.7)
MCV: 91 fL (ref 79–97)
Monocytes Absolute: 0.3 10*3/uL (ref 0.1–0.9)
Monocytes: 9 %
Neutrophils Absolute: 2.1 10*3/uL (ref 1.4–7.0)
Neutrophils: 57 %
Platelets: 166 10*3/uL (ref 150–450)
RBC: 4.05 x10E6/uL (ref 3.77–5.28)
RDW: 14.2 % (ref 11.7–15.4)
WBC: 3.6 10*3/uL (ref 3.4–10.8)

## 2020-04-16 LAB — BASIC METABOLIC PANEL
Anion gap: 8 (ref 5–15)
BUN/Creatinine Ratio: 14 (ref 12–28)
BUN: 9 mg/dL (ref 8–27)
BUN: 12 mg/dL (ref 8–23)
CO2: 23 mmol/L (ref 20–29)
CO2: 24 mmol/L (ref 22–32)
Calcium: 9.1 mg/dL (ref 8.7–10.3)
Calcium: 9.7 mg/dL (ref 8.9–10.3)
Chloride: 103 mmol/L (ref 96–106)
Chloride: 105 mmol/L (ref 98–111)
Creatinine, Ser: 0.65 mg/dL (ref 0.57–1.00)
Creatinine, Ser: 0.6 mg/dL (ref 0.44–1.00)
GFR calc Af Amer: 92 mL/min/{1.73_m2} (ref >59)
GFR calc Af Amer: >60 mL/min (ref >60)
GFR calc non Af Amer: 80 mL/min/{1.73_m2} (ref >59)
GFR calc non Af Amer: >60 mL/min (ref >60)
Glucose, Bld: 112 mg/dL — ABNORMAL HIGH (ref 70–99)
Glucose: 152 mg/dL — ABNORMAL HIGH (ref 65–99)
Potassium: 4.1 mmol/L (ref 3.5–5.2)
Potassium: 4.3 mmol/L (ref 3.5–5.1)
Sodium: 137 mmol/L (ref 134–144)
Sodium: 137 mmol/L (ref 135–145)

## 2020-04-16 LAB — CBC
HCT: 36.7 % (ref 36.0–46.0)
Hemoglobin: 11.6 g/dL — ABNORMAL LOW (ref 12.0–15.0)
MCH: 29 pg (ref 26.0–34.0)
MCHC: 31.6 g/dL (ref 30.0–36.0)
MCV: 91.8 fL (ref 80.0–100.0)
Platelets: 179 10*3/uL (ref 150–400)
RBC: 4 MIL/uL (ref 3.87–5.11)
RDW: 14.9 % (ref 11.5–15.5)
WBC: 4.5 10*3/uL (ref 4.0–10.5)
nRBC: 0 % (ref 0.0–0.2)

## 2020-04-16 LAB — VITAMIN D 25 HYDROXY (VIT D DEFICIENCY, FRACTURES): Vit D, 25-Hydroxy: 39.4 ng/mL (ref 30.0–100.0)

## 2020-04-16 LAB — HEMOGLOBIN A1C
Est. average glucose Bld gHb Est-mCnc: 143 mg/dL
Hgb A1c MFr Bld: 6.6 % — ABNORMAL HIGH (ref 4.8–5.6)

## 2020-04-16 LAB — TSH: TSH: 1.86 u[IU]/mL (ref 0.450–4.500)

## 2020-04-16 LAB — TROPONIN I (HIGH SENSITIVITY): Troponin I (High Sensitivity): 16 ng/L (ref <18)

## 2020-04-16 MED ORDER — SODIUM CHLORIDE 0.9% FLUSH: 3.0000 mL | Freq: Once | INTRAVENOUS | Status: DC

## 2020-04-16 NOTE — Assessment & Plan Note (Signed)
Has a history of urinary tract infections.  Will check urine to confirm no infection.  Denies any dysuria or increased frequency.  Hold abx until can review culture results.

## 2020-04-16 NOTE — Assessment & Plan Note (Signed)
Has been seeing endocrinology.  Last a1c 6.6.  No reports of low blood sugar.  Has been eating.  Continues on metformin.  Follow sugars.  Check met b and a1c.

## 2020-04-16 NOTE — Assessment & Plan Note (Signed)
Follow lipid panel.  Low cholesterol diet.  Discuss statin medication once above issues sorted through.

## 2020-04-16 NOTE — Assessment & Plan Note (Signed)
Followed by cardiology.  Just had pacemaker changed.  Sees Dr Ubaldo Glassing.

## 2020-04-16 NOTE — Assessment & Plan Note (Signed)
Unclear etiology.  Will check head CT as outlined.  Check routine labs including urine to confirm no infection or metabolic etiology.  Discussed further w/up.  Desire neurology referral.  No focal findings on exam today.

## 2020-04-16 NOTE — Addendum Note (Signed)
Addended by: Leeanne Rio on: 04/16/2020 09:11 AM   Modules accepted: Orders

## 2020-04-16 NOTE — Assessment & Plan Note (Signed)
Head injury last week.  Husband reports some change after the injury as outlined.  Getting back to normal now.  No headache.  No dizziness reported.  Check head CT.

## 2020-04-16 NOTE — Assessment & Plan Note (Signed)
On tamoxifen.  S/p XRT.  Follow.

## 2020-04-16 NOTE — Progress Notes (Signed)
Reordered B12 for correct lab. All blood was sent to [facility].

## 2020-04-16 NOTE — Progress Notes (Signed)
Order placed for add on B12

## 2020-04-16 NOTE — Assessment & Plan Note (Signed)
Follow cbc and iron studies.  

## 2020-04-16 NOTE — Assessment & Plan Note (Signed)
Currently asymptomatic.  Continue risk factor modification.

## 2020-04-17 ENCOUNTER — Encounter: Payer: Self-pay | Admitting: Internal Medicine

## 2020-04-17 ENCOUNTER — Other Ambulatory Visit: Payer: Self-pay

## 2020-04-17 ENCOUNTER — Observation Stay
Admission: EM | Admit: 2020-04-17 | Discharge: 2020-04-18 | Disposition: A | Discharge status: Home / Self Care | Payer: Medicare Other | Attending: Internal Medicine | Admitting: Internal Medicine

## 2020-04-17 DIAGNOSIS — I482 Chronic atrial fibrillation, unspecified: Secondary | ICD-10-CM

## 2020-04-17 DIAGNOSIS — C50912 Malignant neoplasm of unspecified site of left female breast: Secondary | ICD-10-CM

## 2020-04-17 DIAGNOSIS — E1140 Type 2 diabetes mellitus with diabetic neuropathy, unspecified: Secondary | ICD-10-CM | POA: Diagnosis present

## 2020-04-17 DIAGNOSIS — R079 Chest pain, unspecified: Secondary | ICD-10-CM | POA: Diagnosis not present

## 2020-04-17 DIAGNOSIS — I2510 Atherosclerotic heart disease of native coronary artery without angina pectoris: Secondary | ICD-10-CM | POA: Diagnosis present

## 2020-04-17 DIAGNOSIS — I214 Non-ST elevation (NSTEMI) myocardial infarction: Secondary | ICD-10-CM | POA: Diagnosis present

## 2020-04-17 DIAGNOSIS — I25119 Atherosclerotic heart disease of native coronary artery with unspecified angina pectoris: Secondary | ICD-10-CM | POA: Diagnosis present

## 2020-04-17 DIAGNOSIS — I4891 Unspecified atrial fibrillation: Secondary | ICD-10-CM | POA: Insufficient documentation

## 2020-04-17 DIAGNOSIS — E785 Hyperlipidemia, unspecified: Secondary | ICD-10-CM

## 2020-04-17 DIAGNOSIS — R4182 Altered mental status, unspecified: Secondary | ICD-10-CM

## 2020-04-17 LAB — VITAMIN B12: Vitamin B-12: 1104 pg/mL (ref 232–1245)

## 2020-04-17 LAB — PROTIME-INR
INR: 1.2 (ref 0.8–1.2)
Prothrombin Time: 14.7 seconds (ref 11.4–15.2)

## 2020-04-17 LAB — TROPONIN I (HIGH SENSITIVITY)
Troponin I (High Sensitivity): 1031 ng/L (ref <18)
Troponin I (High Sensitivity): 1654 ng/L (ref <18)
Troponin I (High Sensitivity): 644 ng/L (ref <18)

## 2020-04-17 LAB — CBC
HCT: 35.8 % — ABNORMAL LOW (ref 36.0–46.0)
Hemoglobin: 11.4 g/dL — ABNORMAL LOW (ref 12.0–15.0)
MCH: 28.9 pg (ref 26.0–34.0)
MCHC: 31.8 g/dL (ref 30.0–36.0)
MCV: 90.9 fL (ref 80.0–100.0)
Platelets: 167 10*3/uL (ref 150–400)
RBC: 3.94 MIL/uL (ref 3.87–5.11)
RDW: 15.1 % (ref 11.5–15.5)
WBC: 3.6 10*3/uL — ABNORMAL LOW (ref 4.0–10.5)
nRBC: 0 % (ref 0.0–0.2)

## 2020-04-17 LAB — BASIC METABOLIC PANEL
Anion gap: 9 (ref 5–15)
BUN: 9 mg/dL (ref 8–23)
CO2: 24 mmol/L (ref 22–32)
Calcium: 9.1 mg/dL (ref 8.9–10.3)
Chloride: 105 mmol/L (ref 98–111)
Creatinine, Ser: 0.54 mg/dL (ref 0.44–1.00)
GFR calc Af Amer: >60 mL/min (ref >60)
GFR calc non Af Amer: >60 mL/min (ref >60)
Glucose, Bld: 126 mg/dL — ABNORMAL HIGH (ref 70–99)
Potassium: 3.8 mmol/L (ref 3.5–5.1)
Sodium: 138 mmol/L (ref 135–145)

## 2020-04-17 LAB — GLUCOSE, CAPILLARY
Glucose-Capillary: 117 mg/dL — ABNORMAL HIGH (ref 70–99)
Glucose-Capillary: 132 mg/dL — ABNORMAL HIGH (ref 70–99)
Glucose-Capillary: 108 mg/dL — ABNORMAL HIGH (ref 70–99)
Glucose-Capillary: 159 mg/dL — ABNORMAL HIGH (ref 70–99)

## 2020-04-17 LAB — APTT: aPTT: 26 seconds (ref 24–36)

## 2020-04-17 LAB — LIPID PANEL
Cholesterol: 145 mg/dL (ref 0–200)
HDL: 47 mg/dL (ref >40)
LDL Cholesterol: 80 mg/dL (ref 0–99)
Total CHOL/HDL Ratio: 3.1 RATIO
Triglycerides: 89 mg/dL (ref <150)
VLDL: 18 mg/dL (ref 0–40)

## 2020-04-17 LAB — MRSA PCR SCREENING: MRSA by PCR: NEGATIVE

## 2020-04-17 LAB — SPECIMEN STATUS REPORT

## 2020-04-17 LAB — SARS CORONAVIRUS 2 BY RT PCR (HOSPITAL ORDER, PERFORMED IN ~~LOC~~ HOSPITAL LAB): SARS Coronavirus 2: NEGATIVE

## 2020-04-17 MED ORDER — ENOXAPARIN SODIUM 40 MG/0.4ML ~~LOC~~ SOLN
40.0000 mg | SUBCUTANEOUS | Status: DC
  Administered 2020-04-17: 40 mg via SUBCUTANEOUS
  Filled 2020-04-17: qty 0.4

## 2020-04-17 MED ORDER — CLOPIDOGREL BISULFATE 75 MG PO TABS
75.0000 mg | ORAL_TABLET | Freq: Every day | ORAL | Status: DC
  Administered 2020-04-17 – 2020-04-18 (×2): 75 mg via ORAL
  Filled 2020-04-17 (×2): qty 1

## 2020-04-17 MED ORDER — METOPROLOL TARTRATE 25 MG PO TABS
25.0000 mg | ORAL_TABLET | Freq: Two times a day (BID) | ORAL | Status: DC
  Administered 2020-04-17 – 2020-04-18 (×4): 25 mg via ORAL
  Filled 2020-04-17 (×4): qty 1

## 2020-04-17 MED ORDER — METOPROLOL TARTRATE 25 MG PO TABS: 12.5000 mg | ORAL_TABLET | Freq: Two times a day (BID) | ORAL | Status: DC

## 2020-04-17 MED ORDER — NITROGLYCERIN 0.4 MG SL SUBL: 0.4000 mg | SUBLINGUAL_TABLET | SUBLINGUAL | Status: DC | PRN

## 2020-04-17 MED ORDER — ATORVASTATIN CALCIUM 20 MG PO TABS
40.0000 mg | ORAL_TABLET | Freq: Every day | ORAL | Status: DC
  Administered 2020-04-17 – 2020-04-18 (×2): 40 mg via ORAL
  Filled 2020-04-17 (×2): qty 2

## 2020-04-17 MED ORDER — ISOSORBIDE MONONITRATE ER 30 MG PO TB24
30.0000 mg | ORAL_TABLET | Freq: Every day | ORAL | Status: DC
  Administered 2020-04-17 – 2020-04-18 (×2): 30 mg via ORAL
  Filled 2020-04-17 (×2): qty 1

## 2020-04-17 MED ORDER — HEPARIN (PORCINE) 25000 UT/250ML-% IV SOLN
750.0000 [IU]/h | INTRAVENOUS | Status: DC
  Administered 2020-04-17: 750 [IU]/h via INTRAVENOUS
  Filled 2020-04-17: qty 250

## 2020-04-17 MED ORDER — QUETIAPINE FUMARATE 25 MG PO TABS
25.0000 mg | ORAL_TABLET | Freq: Every day | ORAL | Status: DC
  Administered 2020-04-17: 25 mg via ORAL
  Filled 2020-04-17: qty 1

## 2020-04-17 MED ORDER — ACETAMINOPHEN 325 MG PO TABS: 650.0000 mg | ORAL_TABLET | ORAL | Status: DC | PRN

## 2020-04-17 MED ORDER — INSULIN ASPART 100 UNIT/ML ~~LOC~~ SOLN
0.0000 [IU] | Freq: Three times a day (TID) | SUBCUTANEOUS | Status: DC
  Administered 2020-04-17 – 2020-04-18 (×3): 2 [IU] via SUBCUTANEOUS
  Filled 2020-04-17 (×3): qty 1

## 2020-04-17 MED ORDER — ONDANSETRON HCL 4 MG/2ML IJ SOLN: 4.0000 mg | Freq: Four times a day (QID) | INTRAMUSCULAR | Status: DC | PRN

## 2020-04-17 MED ORDER — ASPIRIN 81 MG PO TABS: 81.0000 mg | ORAL_TABLET | Freq: Every day | ORAL | Status: DC

## 2020-04-17 MED ORDER — ASPIRIN EC 81 MG PO TBEC
81.0000 mg | DELAYED_RELEASE_TABLET | Freq: Every day | ORAL | Status: DC
  Administered 2020-04-18: 81 mg via ORAL
  Filled 2020-04-17: qty 1

## 2020-04-17 MED ORDER — INSULIN ASPART 100 UNIT/ML ~~LOC~~ SOLN: 0.0000 [IU] | Freq: Every day | SUBCUTANEOUS | Status: DC

## 2020-04-17 MED ORDER — ATORVASTATIN CALCIUM 20 MG PO TABS: 20.0000 mg | ORAL_TABLET | Freq: Every day | ORAL | Status: DC

## 2020-04-17 MED ORDER — TAMOXIFEN CITRATE 10 MG PO TABS
20.0000 mg | ORAL_TABLET | Freq: Every day | ORAL | Status: DC
  Administered 2020-04-17 – 2020-04-18 (×2): 20 mg via ORAL
  Filled 2020-04-17 (×2): qty 2

## 2020-04-17 MED ORDER — HEPARIN SODIUM (PORCINE) 5000 UNIT/ML IJ SOLN: 4000.0000 [IU] | Freq: Once | INTRAMUSCULAR | Status: DC

## 2020-04-17 MED ORDER — HEPARIN BOLUS VIA INFUSION
3000.0000 [IU] | Freq: Once | INTRAVENOUS | Status: AC
  Administered 2020-04-17: 3000 [IU] via INTRAVENOUS
  Filled 2020-04-17: qty 3000

## 2020-04-17 NOTE — Plan of Care (Signed)
  Problem: Safety: Goal: Ability to remain free from injury will improve Outcome: Progressing   Problem: Activity: Goal: Ability to tolerate increased activity will improve Outcome: Progressing   

## 2020-04-17 NOTE — Plan of Care (Signed)
  Problem: Education: Goal: Knowledge of General Education information will improve Description: Including pain rating scale, medication(s)/side effects and non-pharmacologic comfort measures Outcome: Progressing   Problem: Clinical Measurements: Goal: Diagnostic test results will improve Outcome: Progressing   Problem: Safety: Goal: Ability to remain free from injury will improve Outcome: Progressing   

## 2020-04-17 NOTE — Progress Notes (Signed)
Patient presently resting in the bed, remains alert and oriented, denies any pain, family  at bedside, assisted to bathroom as needed, ambulate with PT, no distress noted at this time .

## 2020-04-17 NOTE — Progress Notes (Signed)
Called Holly Rose due to pt. Confusion, wanting to get out of bed, pt agitated.  Encouraged [name] to stay here for the night.

## 2020-04-17 NOTE — Consult Note (Signed)
CARDIOLOGY CONSULT NOTE               Patient ID: Holly Rose MRN: [id number] DOB/AGE: June 18, 1930 86 y.o.  Admit date: 04/17/2020 Referring Physician  Primary Physician Dr. [name]  Primary Cardiologist Dr. Bartholome Bill  Reason for Consultation Chest pain, elevated troponin   HPI: Holly Rose is an 86 year old female with a past medical history significant for longstanding persistent atrial fibrillation, no longer on anticoagulation due to persistent epistaxis, history of a high grade heart block s/p permanent pacemaker implantation, type 2 diabetes, and hyperlipidemia who presented to the ED on 04/16/20 for chest pain.  Workup in the ED was significant for high sensitivity troponin of 16, 644, 1031, and 1654 respectively, ECG revealing atrial fibrillation at a controlled ventricular response; IVCD limits ischemic interpretation, and chest xray revealing no evidence of acute cardiopulmonary disease.    She is followed in outpatient cardiology by Dr. Ubaldo Glassing.  Most recent echocardiogram in 2016 revealed normal RV and LV systolic function with an EF estimated greater than 55% with mild LVH, mild MR, and mild TR.   04/17/20:  Holly Rose is currently sitting up in bed, comfortably, with only mild chest tightness.  She reports symptoms have significantly improved.  She also denies shortness of breath, lower extremity swelling, orthopnea, or PND.  She is currently being worked up in an outpatient setting for a possible CVA following symptoms of acute delirium.   Review of systems complete and found to be negative unless listed above     Past Medical History:  Diagnosis Date  . Allergy   . Anemia   . Arthritis   . Atrial fibrillation ([place]) 2004  . Breast cancer, left ([place]) 10/2017   Lumpectomy and rad tx's.   Marland Kitchen CAD (coronary artery disease)   . Complication of anesthesia    hard to wake up with general anesthesia  . Diabetes ([place])   . Dysrhythmia    A-fib  . Heart attack ([place])  2008  . Heart murmur   . History of kidney stones   . History of sick sinus syndrome    s/p pacemaker placement  . Hyperlipidemia   . Macular degeneration   . Neuropathy   . Neuropathy   . Pacemaker 06/2010  . Personal history of radiation therapy 11/2017   LEFT lumpectomy   . Squamous cell skin cancer   . TIA (transient ischemic attack)     Past Surgical History:  Procedure Laterality Date  . ABDOMINAL HYSTERECTOMY  1960's  . APPENDECTOMY  1947  . [place]  . BREAST BIOPSY Left 12/17/2016   SINGLE FRAGMENT OF ATYPICAL EPITHELIAL CELLS  . BREAST BIOPSY Left 04/08/2017   [facility]  . BREAST EXCISIONAL BIOPSY Left 01/18/1986   Benign microcalcifications, [employer].  Marland Kitchen BREAST LUMPECTOMY Left 05/26/2017   13 mm,T1c, N0; ER/ PR+, Her 2 neu negative, HIGH RISK by [facility] ;  Surgeon: [name], MD;  Location: [facility] ORS;  Service: General;  Laterality: Left;  . BREAST SURGERY  1988  . CARDIAC CATHETERIZATION  1989  . CATARACT EXTRACTION  1997 and 1998  . COLONOSCOPY WITH PROPOFOL N/A 09/12/2015   Procedure: COLONOSCOPY WITH PROPOFOL;  Surgeon: [name], MD;  Location: [facility] ENDOSCOPY;  Service: Endoscopy;  Laterality: N/A;  . IMPLANTABLE CARDIOVERTER DEFIBRILLATOR (ICD) GENERATOR CHANGE Left 03/29/2019   Procedure: PACEMAKER CHANGE OUT;  Surgeon: [name], MD;  Location: [facility] ORS;  Service: Cardiovascular;  Laterality: Left;  . KIDNEY STONE SURGERY  2018  . MOHS SURGERY  2019   forehead  . pace maker  2008  . [place]  . TONSILLECTOMY AND ADENOIDECTOMY  1950"s    Medications Prior to Admission  Medication Sig Dispense Refill Last Dose  . Aflibercept (EYLEA) 2 MG/0.05ML SOLN 1 each by Intravitreal route as directed.      Marland Kitchen alendronate (FOSAMAX) 70 MG tablet Take 1 tablet (70 mg total) by mouth once a week. Take with a full glass of water on an empty stomach. 12 tablet 3 Past Week at Unknown time  . aspirin 81 MG  tablet Take 81 mg by mouth daily.   Past Week at Unknown time  . Biotin w/ Vitamins C & E (HAIR/SKIN/NAILS PO) Take 3 tablets by mouth daily. 2 in the am, 1 in the pm   Past Week at Unknown time  . blood glucose meter kit and supplies Per patient please dispense One touch Ultra2 meter.Use meter and supplies three times a day to check blood sugar. ICD10: E11.40 1 each 0 Past Week at Unknown time  . Blood Glucose Monitoring Suppl (FREESTYLE LITE) DEVI 2 (two) times daily.   Past Week at Unknown time  . Cholecalciferol (VITAMIN D) 50 MCG (2000 UT) CAPS Take 4,000 Units by mouth daily.   Past Week at Unknown time  . furosemide (LASIX) 20 MG tablet Take 40 mg by mouth 2 (two) times daily.    Past Week at Unknown time  . glucose blood test strip Use as instructed to check sugars twice daily. Dx E11.9 100 each 12 Past Week at Unknown time  . Lancets (FREESTYLE) lancets 2 (two) times daily.   Past Week at Unknown time  . Magnesium 200 MG TABS Take 200 mg by mouth 2 (two) times a day.   Past Week at Unknown time  . meclizine (ANTIVERT) 12.5 MG tablet Take 1 tablet (12.5 mg total) by mouth 2 (two) times daily as needed for dizziness. 14 tablet 0 prn at prn  . metFORMIN (GLUCOPHAGE-XR) 500 MG 24 hr tablet Take 2 tablets by mouth twice daily (Patient taking differently: Take 500 mg by mouth 2 (two) times daily. ) 360 tablet 0 Past Week at Unknown time  . metoprolol (LOPRESSOR) 50 MG tablet Take 25 mg by mouth 2 (two) times daily.    Past Week at Unknown time  . Multiple Vitamins-Minerals (PRESERVISION AREDS 2 PO) Take 1 tablet by mouth 2 (two) times daily.    Past Week at Unknown time  . Polyethyl Glycol-Propyl Glycol (SYSTANE OP) Apply 1 drop to eye 2 (two) times daily.   Past Week at Unknown time  . Probiotic Product (PROBIOTIC DAILY PO) Take by mouth.   Past Week at Unknown time  . tamoxifen (NOLVADEX) 20 MG tablet Take 1 tablet by mouth once daily 90 tablet 3 Past Week at Unknown time  . vitamin B-12  (CYANOCOBALAMIN) 500 MCG tablet Take 500 mcg by mouth 2 (two) times a day.   Past Week at Unknown time   Social History   Socioeconomic History  . Marital status: Married    Spouse name: [name]  . Number of children: 2  . Years of education: Not on file  . Highest education level: Not on file  Occupational History  . Not on file  Tobacco Use  . Smoking status: Former Smoker    Years: 5.00    Quit date: 09/14/1968  Years since quitting: 51.6  . Smokeless tobacco: Never Used  . Tobacco comment: quit 1970  Vaping Use  . Vaping Use: Never used  Substance and Sexual Activity  . Alcohol use: No    Alcohol/week: 0.0 standard drinks  . Drug use: No  . Sexual activity: Not on file  Other Topics Concern  . Not on file  Social History Narrative   Live home with husband in [place]   Social Determinants of Health   Financial Resource Strain:   . Difficulty of Paying Living Expenses:   Food Insecurity:   . Worried About Charity fundraiser in the Last Year:   . Arboriculturist in the Last Year:   Transportation Needs:   . Film/video editor (Medical):   Marland Kitchen Lack of Transportation (Non-Medical):   Physical Activity:   . Days of Exercise per Week:   . Minutes of Exercise per Session:   Stress:   . Feeling of Stress :   Social Connections:   . Frequency of Communication with Friends and Family:   . Frequency of Social Gatherings with Friends and Family:   . Attends Religious Services:   . Active Member of Clubs or Organizations:   . Attends Archivist Meetings:   Marland Kitchen Marital Status:   Intimate Partner Violence:   . Fear of Current or Ex-Partner:   . Emotionally Abused:   Marland Kitchen Physically Abused:   . Sexually Abused:     Family History  Problem Relation Age of Onset  . Stroke Mother   . Diabetes Mother   . Cancer Maternal Aunt        Breast Cancer  . Breast cancer Maternal Aunt   . Arthritis Maternal Grandmother   . Cancer Maternal Aunt        Lung  Cancer  . Diabetes Son   . Cancer Son   . Kidney disease Neg Hx   . Bladder Cancer Neg Hx       Review of systems complete and found to be negative unless listed above      PHYSICAL EXAM  General: Well developed, well nourished, in no acute distress HEENT:  Normocephalic and atramatic Neck:  No JVD.  Lungs: Clear bilaterally to auscultation and percussion. Heart: HRRR . Normal S1 and S2 without gallops or murmurs.  Abdomen: Bowel sounds are positive, abdomen soft and non-tender  Msk:  Back normal.  Normal strength and tone for age. Extremities: No clubbing, cyanosis or edema.   Neuro: Alert and oriented X 3. Psych:  Good affect, responds appropriately  Labs:   Lab Results  Component Value Date   WBC 3.6 (L) 04/17/2020   HGB 11.4 (L) 04/17/2020   HCT 35.8 (L) 04/17/2020   MCV 90.9 04/17/2020   PLT 167 04/17/2020    Recent Labs  Lab 04/15/20 1540 04/16/20 1737 04/17/20 0613  NA 137   < > 138  K 4.1   < > 3.8  CL 103   < > 105  CO2 23   < > 24  BUN 9   < > 9  CREATININE 0.65   < > 0.54  CALCIUM 9.1   < > 9.1  PROT 7.4  --   --   BILITOT 0.3  --   --   ALKPHOS 42*  --   --   ALT 29  --   --   AST 32  --   --   GLUCOSE 152*   < >  126*   < > = values in this interval not displayed.   Lab Results  Component Value Date   TROPONINI <0.03 08/15/2018    Lab Results  Component Value Date   CHOL 145 04/17/2020   CHOL 182 04/18/2018   CHOL 193 08/20/2016   Lab Results  Component Value Date   HDL 47 04/17/2020   HDL 63 04/18/2018   HDL 68 08/20/2016   Lab Results  Component Value Date   LDLCALC 80 04/17/2020   LDLCALC 101 (H) 04/18/2018   LDLCALC 102 (H) 08/20/2016   Lab Results  Component Value Date   TRIG 89 04/17/2020   TRIG 89 04/18/2018   TRIG 115 08/20/2016   Lab Results  Component Value Date   CHOLHDL 3.1 04/17/2020   CHOLHDL 2.9 04/18/2018   CHOLHDL 2.8 08/20/2016   No results found for: LDLDIRECT    Radiology: DG Chest 2  View  Result Date: 04/16/2020 CLINICAL DATA:  Chest pain. EXAM: CHEST - 2 VIEW COMPARISON:  June 24, 2010 FINDINGS: The lungs are hyperinflated. A single lead ventricular pacer is noted. Mild, diffuse chronic appearing increased interstitial lung markings are seen. There is no evidence of acute infiltrate, pleural effusion or pneumothorax. The heart size and mediastinal contours are within normal limits. There is mild calcification of the aortic arch. The visualized skeletal structures are unremarkable. IMPRESSION: Chronic appearing increased interstitial lung markings without evidence of acute or active cardiopulmonary disease. Electronically Signed   By: Virgina Norfolk M.D.   On: 04/16/2020 17:57   CT Head Wo Contrast  Result Date: 04/15/2020 CLINICAL DATA:  Altered mental status. Fall 6 days ago with head injury EXAM: CT HEAD WITHOUT CONTRAST TECHNIQUE: Contiguous axial images were obtained from the base of the skull through the vertex without intravenous contrast. COMPARISON:  CT head 08/15/2018 FINDINGS: Brain: Generalized atrophy without hydrocephalus. Chronic microvascular ischemic changes in the white matter. Chronic infarct right occipital lobe unchanged. Negative for acute infarct, hemorrhage, mass Vascular: Negative for hyperdense vessel Skull: Negative Sinuses/Orbits: Negative Other: None IMPRESSION: Atrophy and chronic ischemic changes. No acute abnormality no change from the prior CT. Electronically Signed   By: Franchot Gallo M.D.   On: 04/15/2020 14:20    EKG: Atrial fibrillation with controlled ventricular response; IVCD   ASSESSMENT AND PLAN:  1.  Chest pain, elevated troponin   -Chest pain has improved; with recent history of potential neurological event, will defer invasive workup with a LHC at this time   -Mutual decision was made with family members for medical management  -Plavix added; will monitor for bleeding   -Imdur added, continue atorvastatin   2.  Atrial  fibrillation   -Rate remains well controlled on metoprolol 58m BID; will continue with this and follow   -History of a bleed on Eliquis   The history, physical exam findings, and plan of care were all discussed with Dr. KBartholome Bill and all decision making was made in collaboration.    Signed: NAvie ArenasPA-C 04/17/2020, 7:34 AM

## 2020-04-17 NOTE — Evaluation (Signed)
Physical Therapy Evaluation Patient Details Name: Holly Rose MRN: [id number] DOB: Oct 10, 1929 Today's Date: 04/17/2020   History of Present Illness  Pt is an 86 year old female with a past medical history significant for longstanding persistent atrial fibrillation, no longer on anticoagulation due to persistent epistaxis,  breast cancer, history of a high grade heart block s/p permanent pacemaker implantation, type 2 diabetes, and hyperlipidemia who presented to the ED on 04/16/20 for chest pain. EKG showed frequent PVCs without acute ST-T wave change.  Troponin was uptrending 16->644->1031.  Patient was started on a heparin infusion for NSTEMI, cleared for ambulation via Dr. Leslye Peer.    Clinical Impression  Pt alert, in bed, family at bedside. Pt did state she had some chest "pressure" but no pain and overall the sensation was improved from admission. Pt reported at baseline she is modI for ambulation with RW, husband assists with fine motor tasks of ADLs, one story home.  The patient demonstrated bed mobility modI, able to sit EOB for several minutes with good balance. Sit <> stand with RW and CGA, cued for hand placement with poor carry over. She then ambulated ~153ft with RW and CGA (CGA due to heparin infusion, may not have been required), steady and safe no LOB noted. HR ranged from 90s-110s. Pt up in chair with all needs in reach and set up for breakfast at end of session.  Overall the patient demonstrated mild deficits (see "PT Problem List") from pt PLOF, but would benefit from skilled PT intervention to maximize endurance/activity tolerance, safety, and independence. Recommendation is HHPT.     Follow Up Recommendations Home health PT    Equipment Recommendations  None recommended by PT    Recommendations for Other Services       Precautions / Restrictions Precautions Precautions: ICD/Pacemaker;Fall Restrictions Weight Bearing Restrictions: No      Mobility  Bed  Mobility Overal bed mobility: Modified Independent                Transfers Overall transfer level: Needs assistance Equipment used: Rolling walker (2 wheeled) Transfers: Sit to/from Stand Sit to Stand: Min guard            Ambulation/Gait Ambulation/Gait assistance: Min guard Gait Distance (Feet): 125 Feet Assistive device: Rolling walker (2 wheeled)       General Gait Details: overall steady, safe. HR 90s-100s. CGA provided due to heparin infusion, may not have been required  Stairs            Wheelchair Mobility    Modified Rankin (Stroke Patients Only)       Balance Overall balance assessment: Needs assistance Sitting-balance support: Feet supported Sitting balance-Leahy Scale: Normal       Standing balance-Leahy Scale: Fair                               Pertinent Vitals/Pain Pain Assessment: No/denies pain    Home Living Family/patient expects to be discharged to:: Private residence Living Arrangements: Spouse/significant other Available Help at Discharge: Family;Available 24 hours/day Type of Home: Independent living facility ([facility]) Home Access: Level entry     Home Layout: One level Home Equipment: Shower seat - built in;Walker - 2 wheels;Grab bars - toilet;Grab bars - tub/shower      Prior Function Level of Independence: Independent with assistive device(s)         Comments: uses RW for ambulation. Husband drives, pt has not  been out to the grocery store with him since Covid.     Hand Dominance        Extremity/Trunk Assessment   Upper Extremity Assessment Upper Extremity Assessment: Overall WFL for tasks assessed    Lower Extremity Assessment Lower Extremity Assessment: Generalized weakness    Cervical / Trunk Assessment Cervical / Trunk Assessment: Kyphotic  Communication   Communication: HOH  Cognition Arousal/Alertness: Awake/alert Behavior During Therapy: WFL for tasks  assessed/performed Overall Cognitive Status: Within Functional Limits for tasks assessed                                        General Comments      Exercises     Assessment/Plan    PT Assessment Patient needs continued PT services  PT Problem List Decreased activity tolerance;Decreased knowledge of use of DME       PT Treatment Interventions DME instruction;Balance training;Gait training;Neuromuscular re-education;Functional mobility training;Patient/family education;Therapeutic activities;Therapeutic exercise    PT Goals (Current goals can be found in the Care Plan section)  Acute Rehab PT Goals Patient Stated Goal: to go home PT Goal Formulation: With patient Time For Goal Achievement: 05/01/20 Potential to Achieve Goals: Good    Frequency Min 2X/week   Barriers to discharge        Co-evaluation               AM-PAC PT "6 Clicks" Mobility  Outcome Measure Help needed turning from your back to your side while in a flat bed without using bedrails?: None Help needed moving from lying on your back to sitting on the side of a flat bed without using bedrails?: None Help needed moving to and from a bed to a chair (including a wheelchair)?: None Help needed standing up from a chair using your arms (e.g., wheelchair or bedside chair)?: None Help needed to walk in hospital room?: A Little Help needed climbing 3-5 steps with a railing? : A Little 6 Click Score: 22    End of Session Equipment Utilized During Treatment: Gait belt Activity Tolerance: Patient tolerated treatment well Patient left: in chair;with family/visitor present;with call bell/phone within reach;with chair alarm set Nurse Communication: Mobility status PT Visit Diagnosis: Other abnormalities of gait and mobility (R26.89)    Time: 8295-6213 PT Time Calculation (min) (ACUTE ONLY): 25 min   Charges:   PT Evaluation $PT Eval Low Complexity: 1 Low PT Treatments $Therapeutic  Exercise: 8-22 mins      Lieutenant Diego PT, DPT 10:57 AM,04/17/20

## 2020-04-17 NOTE — H&P (Signed)
History and Physical    Holly Rose AVW:[id number] DOB: March 27, 1930 DOA: 04/17/2020  PCP: Holly Pheasant, MD   Patient coming from: Assisted living  I have personally briefly reviewed patient's old medical records in [name]  Chief Complaint: Chest pain  HPI: Holly Rose is a 86 y.o. female with medical history significant for CAD, A. fib, pacemaker, diabetes presents to the emergency room with retrosternal chest pain described as tightness that has been intermittent for the past 2 weeks but got acutely worse and severe in the hour prior to presentation.  The pain is in the retrosternal area, nonradiating unassociated with weakness.  No associated nausea vomiting or diaphoresis shortness of breath or lightheadedness.  Denies cough fever or chills.  Patient received aspirin in route and continued to have mild chest discomfort on arrival.  Daughter-in-law at bedside contributes to history ED Course: EKG showed frequent PVCs without acute ST-T wave change.  Troponin was uptrending 16->644->1031.  Patient was started on a heparin infusion.  Hospitalist consulted for admission.  Review of Systems: As per HPI otherwise all other systems on review of systems negative.    Past Medical History:  Diagnosis Date  . Allergy   . Anemia   . Arthritis   . Atrial fibrillation (Holly Rose) 2004  . Breast cancer, left (Holly Rose) 10/2017   Lumpectomy and rad tx's.   Marland Kitchen CAD (coronary artery disease)   . Complication of anesthesia    hard to wake up with general anesthesia  . Diabetes (Holly Rose)   . Dysrhythmia    A-fib  . Heart attack (Holly Rose) 2008  . Heart murmur   . History of kidney stones   . History of sick sinus syndrome    s/p pacemaker placement  . Hyperlipidemia   . Macular degeneration   . Neuropathy   . Neuropathy   . Pacemaker 06/2010  . Personal history of radiation therapy 11/2017   LEFT lumpectomy   . Squamous cell skin cancer   . TIA (transient ischemic attack)     Past  Surgical History:  Procedure Laterality Date  . ABDOMINAL HYSTERECTOMY  1960's  . APPENDECTOMY  1947  . [place]  . BREAST BIOPSY Left 12/17/2016   SINGLE FRAGMENT OF ATYPICAL EPITHELIAL CELLS  . BREAST BIOPSY Left 04/08/2017   [facility]  . BREAST EXCISIONAL BIOPSY Left 01/18/1986   Benign microcalcifications, [employer].  Marland Kitchen BREAST LUMPECTOMY Left 05/26/2017   13 mm,T1c, N0; ER/ PR+, Her 2 neu negative, HIGH RISK by [place] ;  Surgeon: Holly Bellow, MD;  Location: [facility] ORS;  Service: General;  Laterality: Left;  . BREAST SURGERY  1988  . CARDIAC CATHETERIZATION  1989  . CATARACT EXTRACTION  1997 and 1998  . COLONOSCOPY WITH PROPOFOL N/A 09/12/2015   Procedure: COLONOSCOPY WITH PROPOFOL;  Surgeon: Holly Silvas, MD;  Location[facility] ENDOSCOPY;  Service: Endoscopy;  Laterality: N/A;  . IMPLANTABLE CARDIOVERTER DEFIBRILLATOR (ICD) GENERATOR CHANGE Left 03/29/2019   Procedure: PACEMAKER CHANGE OUT;  Surgeon: Holly Cowman, MD;  Location: [facility] ORS;  Service: Cardiovascular;  Laterality: Left;  . KIDNEY STONE SURGERY  2018  . MOHS SURGERY  2019   forehead  . pace maker  2008  . [place]  . TONSILLECTOMY AND ADENOIDECTOMY  1950"s     reports that she quit smoking about 51 years ago. She quit after 5.00 years of use. She has never used smokeless tobacco. She reports that  she does not drink alcohol and does not use drugs.  Allergies  Allergen Reactions  . Lyrica [Pregabalin] Swelling    Sleepy, does not eat  . Neurontin [Gabapentin] Nausea And Vomiting and Other (See Comments)    disoriented  . Bactrim [Sulfamethoxazole-Trimethoprim]     Made her dizziness    Family History  Problem Relation Age of Onset  . Stroke Mother   . Diabetes Mother   . Cancer Maternal Aunt        Breast Cancer  . Breast cancer Maternal Aunt   . Arthritis Maternal Grandmother   . Cancer Maternal Aunt        Lung Cancer  . Diabetes Son   . Cancer  Son   . Kidney disease Neg Hx   . Bladder Cancer Neg Hx       Prior to Admission medications   Medication Sig Start Date End Date Taking? Authorizing Provider  Aflibercept (EYLEA) 2 MG/0.05ML SOLN 1 each by Intravitreal route as directed.    Yes [provider]  alendronate (FOSAMAX) 70 MG tablet Take 1 tablet (70 mg total) by mouth once a week. Take with a full glass of water on an empty stomach. 12/11/19  Yes [name], MD  aspirin 81 MG tablet Take 81 mg by mouth daily.   Yes [provider]  Biotin w/ Vitamins C & E (HAIR/SKIN/NAILS PO) Take 3 tablets by mouth daily. 2 in the am, 1 in the pm   Yes [provider]  blood glucose meter kit and supplies Per patient please dispense One touch Ultra2 meter.Use meter and supplies three times a day to check blood sugar. ICD10: E11.40 07/25/15  Yes Holly Pheasant, MD  Blood Glucose Monitoring Suppl (FREESTYLE LITE) DEVI 2 (two) times daily. 03/04/20  Yes [provider]  Cholecalciferol (VITAMIN D) 50 MCG (2000 UT) CAPS Take 4,000 Units by mouth daily.   Yes [provider]  furosemide (LASIX) 20 MG tablet Take 40 mg by mouth 2 (two) times daily.    Yes [provider]  glucose blood test strip Use as instructed to check sugars twice daily. Dx E11.9 03/20/20  Yes Holly Pheasant, MD  Lancets (FREESTYLE) lancets 2 (two) times daily. 03/02/20  Yes [provider]  Magnesium 200 MG TABS Take 200 mg by mouth 2 (two) times a day.   Yes [provider]  meclizine (ANTIVERT) 12.5 MG tablet Take 1 tablet (12.5 mg total) by mouth 2 (two) times daily as needed for dizziness. 02/07/20  Yes Holly Pheasant, MD  metFORMIN (GLUCOPHAGE-XR) 500 MG 24 hr tablet Take 2 tablets by mouth twice daily Patient taking differently: Take 500 mg by mouth 2 (two) times daily.  01/31/20  Yes Holly Pheasant, MD  metoprolol (LOPRESSOR) 50 MG tablet Take 25 mg by mouth 2 (two) times daily.  02/03/16  Yes  [provider]  Multiple Vitamins-Minerals (PRESERVISION AREDS 2 PO) Take 1 tablet by mouth 2 (two) times daily.    Yes [provider]  Polyethyl Glycol-Propyl Glycol (SYSTANE OP) Apply 1 drop to eye 2 (two) times daily.   Yes [provider]  Probiotic Product (PROBIOTIC DAILY PO) Take by mouth.   Yes [provider]  tamoxifen (NOLVADEX) 20 MG tablet Take 1 tablet by mouth once daily 03/08/20  Yes Finnegan, Kathlene November, MD  vitamin B-12 (CYANOCOBALAMIN) 500 MCG tablet Take 500 mcg by mouth 2 (two) times a day.   Yes [provider]  Physical Exam: Vitals:   04/16/20 1722 04/16/20 1731 04/17/20 0016  BP: (!) 142/74  (!) 145/75  Pulse: 81  82  Resp: 20  18  Temp: 98.8 F (37.1 C)  (!) 97.5 F (36.4 C)  TempSrc: Oral  Oral  SpO2: 95%  95%  Weight:  69.4 kg   Height:  5' 2.01" (1.575 m)      Vitals:   04/16/20 1722 04/16/20 1731 04/17/20 0016  BP: (!) 142/74  (!) 145/75  Pulse: 81  82  Resp: 20  18  Temp: 98.8 F (37.1 C)  (!) 97.5 F (36.4 C)  TempSrc: Oral  Oral  SpO2: 95%  95%  Weight:  69.4 kg   Height:  5' 2.01" (1.575 m)       Constitutional:  Alert and oriented x2, but appears weak  HEENT:      Head: Normocephalic and atraumatic.         Eyes: PERLA, EOMI, Conjunctivae are normal. Sclera is non-icteric.       Mouth/Throat: Mucous membranes are moist.       Neck: Supple with no signs of meningismus. Cardiovascular: Regular rate and rhythm. No murmurs, gallops, or rubs. 2+ symmetrical distal pulses are present . No JVD. No LE edema Respiratory: Respiratory effort normal .Lungs sounds clear bilaterally. No wheezes, crackles, or rhonchi.  Gastrointestinal: Soft, non tender, and non distended with positive bowel sounds. No rebound or guarding. Genitourinary: No CVA tenderness. Musculoskeletal: Nontender with normal range of motion in all extremitie. No cyanosis, or erythema of extremities. Neurologic: Normal speech and  language. Face is symmetric. Moving all extremities. No gross focal neurologic deficits . Skin: Skin is warm, dry.  No rash or ulcers Psychiatric: Mood and affect are normal Speech and behavior are normal   Labs on Admission: I have personally reviewed following labs and imaging studies  CBC: Recent Labs  Lab 04/15/20 1540 04/16/20 1737  WBC 3.6 4.5  NEUTROABS 2.1  --   HGB 11.4 11.6*  HCT 36.8 36.7  MCV 91 91.8  PLT 166 562   Basic Metabolic Panel: Recent Labs  Lab 04/15/20 1540 04/16/20 1737  NA 137 137  K 4.1 4.3  CL 103 105  CO2 23 24  GLUCOSE 152* 112*  BUN 9 12  CREATININE 0.65 0.60  CALCIUM 9.1 9.7   GFR: Estimated Creatinine Clearance: 44.4 mL/min (by C-G formula based on SCr of 0.6 mg/dL). Liver Function Tests: Recent Labs  Lab 04/15/20 1540  AST 32  ALT 29  ALKPHOS 42*  BILITOT 0.3  PROT 7.4  ALBUMIN 3.6   No results for input(s): LIPASE, AMYLASE in the last 168 hours. No results for input(s): AMMONIA in the last 168 hours. Coagulation Profile: Recent Labs  Lab 04/17/20 0107  INR 1.2   Cardiac Enzymes: No results for input(s): CKTOTAL, CKMB, CKMBINDEX, TROPONINI in the last 168 hours. BNP (last 3 results) No results for input(s): PROBNP in the last 8760 hours. HbA1C: Recent Labs    04/15/20 1540  HGBA1C 6.6*   CBG: No results for input(s): GLUCAP in the last 168 hours. Lipid Profile: No results for input(s): CHOL, HDL, LDLCALC, TRIG, CHOLHDL, LDLDIRECT in the last 72 hours. Thyroid Function Tests: Recent Labs    04/15/20 1540  TSH 1.860   Anemia Panel: No results for input(s): VITAMINB12, FOLATE, FERRITIN, TIBC, IRON, RETICCTPCT in the last 72 hours. Urine analysis:    Component Value Date/Time   COLORURINE YELLOW 04/15/2020 1241   APPEARANCEUR  Sl Cloudy (A) 04/15/2020 1241   APPEARANCEUR Clear 06/07/2019 0940   LABSPEC 1.025 04/15/2020 1241   PHURINE 5.5 04/15/2020 1241   GLUCOSEU NEGATIVE 04/15/2020 1241   HGBUR  TRACE-INTACT (A) 04/15/2020 1241   BILIRUBINUR NEGATIVE 04/15/2020 1241   BILIRUBINUR Negative 06/07/2019 0940   KETONESUR 15 (A) 04/15/2020 1241   PROTEINUR Trace (A) 06/07/2019 0940   PROTEINUR NEGATIVE 08/15/2018 1608   UROBILINOGEN 0.2 04/15/2020 1241   NITRITE POSITIVE (A) 04/15/2020 1241   LEUKOCYTESUR SMALL (A) 04/15/2020 1241    Radiological Exams on Admission: DG Chest 2 View  Result Date: 04/16/2020 CLINICAL DATA:  Chest pain. EXAM: CHEST - 2 VIEW COMPARISON:  June 24, 2010 FINDINGS: The lungs are hyperinflated. A single lead ventricular pacer is noted. Mild, diffuse chronic appearing increased interstitial lung markings are seen. There is no evidence of acute infiltrate, pleural effusion or pneumothorax. The heart size and mediastinal contours are within normal limits. There is mild calcification of the aortic arch. The visualized skeletal structures are unremarkable. IMPRESSION: Chronic appearing increased interstitial lung markings without evidence of acute or active cardiopulmonary disease. Electronically Signed   By: Virgina Norfolk M.D.   On: 04/16/2020 17:57   CT Head Wo Contrast  Result Date: 04/15/2020 CLINICAL DATA:  Altered mental status. Fall 6 days ago with head injury EXAM: CT HEAD WITHOUT CONTRAST TECHNIQUE: Contiguous axial images were obtained from the base of the skull through the vertex without intravenous contrast. COMPARISON:  CT head 08/15/2018 FINDINGS: Brain: Generalized atrophy without hydrocephalus. Chronic microvascular ischemic changes in the white matter. Chronic infarct right occipital lobe unchanged. Negative for acute infarct, hemorrhage, mass Vascular: Negative for hyperdense vessel Skull: Negative Sinuses/Orbits: Negative Other: None IMPRESSION: Atrophy and chronic ischemic changes. No acute abnormality no change from the prior CT. Electronically Signed   By: Franchot Gallo M.D.   On: 04/15/2020 14:20    EKG: Independently reviewed. Interpretation :  Frequent PVCs but no acute ST-T wave changes  Assessment/Plan 86 year old female with history of CAD, A. fib, pacemaker, diabetes presents to the emergency room with retrosternal chest pain, ruling in for NSTEMI    NSTEMI (non-ST elevated myocardial infarction) ([place]) CAD -Continue heparin infusion -Patient received aspirin with EMS -Continue daily aspirin, beta-blocker, statin -Cardiology consult  Frequent PVCs -Continuous cardiac monitoring -Optimize electrolytes -Continue metoprolol    Atrial fibrillation, chronic -Patient in sinus at a rate of 70 -On metoprolol.  Not currently on systemic anticoagulation    Diabetes mellitus with neuropathy (H[facility]) -Sliding scale insulin coverage pending med rec    Malignant neoplasm of upper-outer quadrant of left breast in female, estrogen receptor positive ([place]) -Continue tamoxifen pending med rec    DVT prophylaxis: Full dose heparin Code Status: full code  Family Communication: Daughter-in-law at bedside Disposition Plan: Back to previous home environment Consults called: Cardiology Status:At the time of admission, it appears that the appropriate admission status for this patient is INPATIENT. This is judged to be reasonable and necessary in order to provide the required intensity of service to ensure the patient's safety given the presenting symptoms, physical exam findings, and initial radiographic and laboratory data in the context of their  Comorbid conditions.   Patient requires inpatient status due to high intensity of service, high risk for further deterioration and high frequency of surveillance required.   I certify that at the point of admission it is my clinical judgment that the patient will require inpatient hospital care spanning beyond 2 midnights  Athena Masse MD [facility]     04/17/2020, 2:27 AM

## 2020-04-17 NOTE — ED Provider Notes (Signed)
[facility] Emergency Department Provider Note  ____________________________________________   First MD Initiated Contact with Patient 04/17/20 0100     (approximate)  I have reviewed the triage vital signs and the nursing notes.   HISTORY  Chief Complaint Chest Pain    HPI Holly Rose is a 86 y.o. female with below list of previous medical conditions including atrial fibrillation sick sinus syndrome with an indwelling pacemaker diabetes presents to the emergency department secondary to acute onset of left-sided chest pain that began 45 minutes before arrival to the emergency department.  Triage note from 5:20 PM states that EMS notified that the patient had 5-6 beat run of V. tach while in route as well as atrial fibrillation with multiple PVCs.  Patient was given 3 and 24 mg of aspirin in route.  Patient continues to have chest discomfort at present.  Patient denies any dyspnea no headache no nausea or vomiting.  Chart review reveals the patient's initial troponin was normal value of 16 however repeat troponin 644.        Past Medical History:  Diagnosis Date  . Allergy   . Anemia   . Arthritis   . Atrial fibrillation ([place]) 2004  . Breast cancer, left ([place]) 10/2017   Lumpectomy and rad tx's.   Marland Kitchen CAD (coronary artery disease)   . Complication of anesthesia    hard to wake up with general anesthesia  . Diabetes ([place])   . Dysrhythmia    A-fib  . Heart attack ([place]) 2008  . Heart murmur   . History of kidney stones   . History of sick sinus syndrome    s/p pacemaker placement  . Hyperlipidemia   . Macular degeneration   . Neuropathy   . Neuropathy   . Pacemaker 06/2010  . Personal history of radiation therapy 11/2017   LEFT lumpectomy   . Squamous cell skin cancer   . TIA (transient ischemic attack)     Patient Active Problem List   Diagnosis Date Noted  . Change in mental status 04/15/2020  . Head injury 04/15/2020  . Dysuria  04/30/2019  . Frequent urinary tract infections 04/30/2019  . Age-related osteoporosis without current pathological fracture 03/01/2019  . Dizziness 08/29/2018  . UTI (urinary tract infection) 08/15/2018  . Vitamin D deficiency, unspecified 06/16/2017  . Hypercalcinuria 06/14/2017  . SCC (squamous cell carcinoma), face 03/29/2017  . History of kidney stones 03/12/2017  . Malignant neoplasm of upper-outer quadrant of left breast in female, estrogen receptor positive ([place]) 12/28/2016  . Chronic cystitis 11/05/2016  . Nephrolithiasis 11/05/2016  . Renal colic 16/06/9603  . Urge incontinence 11/05/2016  . Bilateral hand pain 11/03/2016  . Generalized osteoarthritis of hand 11/03/2016  . Numbness and tingling in both hands 11/03/2016  . Bradycardia 04/15/2016  . History of colonic polyps 09/16/2015  . Abdominal pain 08/18/2015  . Rectal bleeding 08/18/2015  . CAD (coronary artery disease) 02/17/2015  . Atrial fibrillation, chronic 02/17/2015  . Diabetes mellitus with neuropathy ([place]) 02/17/2015  . Hypercholesterolemia 02/17/2015  . Iron deficiency anemia 02/17/2015  . Macular degeneration 02/17/2015  . Stress 02/17/2015  . Health care maintenance 02/17/2015  . Diarrhea 02/17/2015  . Type II diabetes mellitus ([place]) 07/13/2014  . Diverticulosis 03/31/2013  . GI bleeding 03/31/2013  . Mitral valve prolapse 03/31/2013  . Diverticulosis of intestine without perforation or abscess without bleeding 03/31/2013    Past Surgical History:  Procedure Laterality Date  . ABDOMINAL HYSTERECTOMY  1960's  .  APPENDECTOMY  1947  . [place]  . BREAST BIOPSY Left 12/17/2016   SINGLE FRAGMENT OF ATYPICAL EPITHELIAL CELLS  . BREAST BIOPSY Left 04/08/2017   [facility]  . BREAST EXCISIONAL BIOPSY Left 01/18/1986   Benign microcalcifications, [employer].  Marland Kitchen BREAST LUMPECTOMY Left 05/26/2017   13 mm,T1c, N0; ER/ PR+, Her 2 neu negative, HIGH RISK by [place] ;  Surgeon:  [name], MD;  Location: [facility] ORS;  Service: General;  Laterality: Left;  . BREAST SURGERY  1988  . CARDIAC CATHETERIZATION  1989  . CATARACT EXTRACTION  1997 and 1998  . COLONOSCOPY WITH PROPOFOL N/A 09/12/2015   Procedure: COLONOSCOPY WITH PROPOFOL;  Surgeon: [name], MD;  Location: [facility] ENDOSCOPY;  Service: Endoscopy;  Laterality: N/A;  . IMPLANTABLE CARDIOVERTER DEFIBRILLATOR (ICD) GENERATOR CHANGE Left 03/29/2019   Procedure: PACEMAKER CHANGE OUT;  Surgeon: [name], MD;  Location: [facility] ORS;  Service: Cardiovascular;  Laterality: Left;  . KIDNEY STONE SURGERY  2018  . MOHS SURGERY  2019   forehead  . pace maker  2008  . [place]  . TONSILLECTOMY AND ADENOIDECTOMY  1950"s    Prior to Admission medications   Medication Sig Start Date End Date Taking? Authorizing Provider  Aflibercept (EYLEA) 2 MG/0.05ML SOLN 1 each by Intravitreal route as directed.    Yes [provider]  alendronate (FOSAMAX) 70 MG tablet Take 1 tablet (70 mg total) by mouth once a week. Take with a full glass of water on an empty stomach. 12/11/19  Yes [name], MD  aspirin 81 MG tablet Take 81 mg by mouth daily.   Yes [provider]  Biotin w/ Vitamins C & E (HAIR/SKIN/NAILS PO) Take 3 tablets by mouth daily. 2 in the am, 1 in the pm   Yes [provider]  blood glucose meter kit and supplies Per patient please dispense One touch Ultra2 meter.Use meter and supplies three times a day to check blood sugar. ICD10: E11.40 07/25/15  Yes [name], MD  Blood Glucose Monitoring Suppl (FREESTYLE LITE) DEVI 2 (two) times daily. 03/04/20  Yes [provider]  Cholecalciferol (VITAMIN D) 50 MCG (2000 UT) CAPS Take 4,000 Units by mouth daily.   Yes [provider]  furosemide (LASIX) 20 MG tablet Take 20 mg by mouth daily as needed for fluid.    Yes [provider]  glucose blood test strip Use as instructed to check  sugars twice daily. Dx E11.9 03/20/20  Yes [name], MD  Lancets (FREESTYLE) lancets 2 (two) times daily. 03/02/20  Yes [provider]  Magnesium 200 MG TABS Take 200 mg by mouth 2 (two) times a day.   Yes [provider]  meclizine (ANTIVERT) 12.5 MG tablet Take 1 tablet (12.5 mg total) by mouth 2 (two) times daily as needed for dizziness. 02/07/20  Yes [name], MD  metFORMIN (GLUCOPHAGE-XR) 500 MG 24 hr tablet Take 2 tablets by mouth twice daily 01/31/20  Yes [name], MD  metoprolol (LOPRESSOR) 50 MG tablet Take 25 mg by mouth 2 (two) times daily.  02/03/16  Yes [provider]  Multiple Vitamins-Minerals (PRESERVISION AREDS 2 PO) Take 1 tablet by mouth 2 (two) times daily.    Yes [provider]  Polyethyl Glycol-Propyl Glycol (SYSTANE OP) Apply 1 drop to eye 2 (two) times daily.   Yes [provider]  Probiotic Product (PROBIOTIC DAILY PO) Take by mouth.  Yes [provider]  tamoxifen (NOLVADEX) 20 MG tablet Take 1 tablet by mouth once daily 03/08/20  Yes Finnegan, Kathlene November, MD  vitamin B-12 (CYANOCOBALAMIN) 500 MCG tablet Take 500 mcg by mouth 2 (two) times a day.   Yes [provider]    Allergies Lyrica [pregabalin], Neurontin [gabapentin], and Bactrim [sulfamethoxazole-trimethoprim]  Family History  Problem Relation Age of Onset  . Stroke Mother   . Diabetes Mother   . Cancer Maternal Aunt        Breast Cancer  . Breast cancer Maternal Aunt   . Arthritis Maternal Grandmother   . Cancer Maternal Aunt        Lung Cancer  . Diabetes Son   . Cancer Son   . Kidney disease Neg Hx   . Bladder Cancer Neg Hx     Social History Social History   Tobacco Use  . Smoking status: Former Smoker    Years: 5.00    Quit date: 09/14/1968    Years since quitting: 51.6  . Smokeless tobacco: Never Used  . Tobacco comment: quit 1970  Vaping Use  . Vaping Use: Never used  Substance Use Topics  . Alcohol use:  No    Alcohol/week: 0.0 standard drinks  . Drug use: No    Review of Systems Constitutional: No fever/chills Eyes: No visual changes. ENT: No sore throat. Cardiovascular: Positive for chest pain. Respiratory: Denies shortness of breath. Gastrointestinal: No abdominal pain.  No nausea, no vomiting.  No diarrhea.  No constipation. Genitourinary: Negative for dysuria. Musculoskeletal: Negative for neck pain.  Negative for back pain. Integumentary: Negative for rash. Neurological: Negative for headaches, focal weakness or numbness.  ____________________________________________   PHYSICAL EXAM:  VITAL SIGNS: ED Triage Vitals  Enc Vitals Group     BP 04/16/20 1722 (!) 142/74     Pulse Rate 04/16/20 1722 81     Resp 04/16/20 1722 20     Temp 04/16/20 1722 98.8 F (37.1 C)     Temp Source 04/16/20 1722 Oral     SpO2 04/16/20 1722 95 %     Weight 04/16/20 1731 69.4 kg (153 lb)     Height 04/16/20 1731 1.575 m (5' 2.01")     Head Circumference --      Peak Flow --      Pain Score 04/16/20 1723 (P) 5     Pain Loc --      Pain Edu? --      Excl. in [place]? --     Constitutional: Alert and oriented.  Eyes: Conjunctivae are normal.  Head: Atraumatic. Mouth/Throat: Patient is wearing a mask. Neck: No stridor.  No meningeal signs.   Cardiovascular: Normal rate, regular rhythm. Good peripheral circulation. Grossly normal heart sounds. Respiratory: Normal respiratory effort.  No retractions. Gastrointestinal: Soft and nontender. No distention.  Musculoskeletal: No lower extremity tenderness nor edema. No gross deformities of extremities. Neurologic:  Normal speech and language. No gross focal neurologic deficits are appreciated.  Skin:  Skin is warm, dry and intact. Psychiatric: Mood and affect are normal. Speech and behavior are normal.  ____________________________________________   LABS (all labs ordered are listed, but only abnormal results are displayed)  Labs Reviewed    BASIC METABOLIC PANEL - Abnormal; Notable for the following components:      Result Value   Glucose, Bld 112 (*)    All other components within normal limits  CBC - Abnormal; Notable for the following components:   Hemoglobin 11.6 (*)  All other components within normal limits  TROPONIN I (HIGH SENSITIVITY) - Abnormal; Notable for the following components:   Troponin I (High Sensitivity) 644 (*)    All other components within normal limits  APTT  PROTIME-INR  TROPONIN I (HIGH SENSITIVITY)  TROPONIN I (HIGH SENSITIVITY)   ____________________________________________  EKG  ED ECG REPORT I, [place] N Carney Saxton, the attending physician, personally viewed and interpreted this ECG.   Date: 04/16/2020  EKG Time: 5:18 PM  Rate: 85  Rhythm: Atrial fibrillation with rapid ventricular response  Axis:   Intervals: Irregular RR interval  ST&T Change: None  ____________________________________________  RADIOLOGY I, [place] Alexandrya Chim, personally viewed and evaluated these images (plain radiographs) as part of my medical decision making, as well as reviewing the written report by the radiologist.  ED MD interpretation: Chronic appearing interstitial markings without evidence of acute or active cardiopulmonary disease on chest x-ray impression per radiologist.  Official radiology report(s): DG Chest 2 View  Result Date: 04/16/2020 CLINICAL DATA:  Chest pain. EXAM: CHEST - 2 VIEW COMPARISON:  June 24, 2010 FINDINGS: The lungs are hyperinflated. A single lead ventricular pacer is noted. Mild, diffuse chronic appearing increased interstitial lung markings are seen. There is no evidence of acute infiltrate, pleural effusion or pneumothorax. The heart size and mediastinal contours are within normal limits. There is mild calcification of the aortic arch. The visualized skeletal structures are unremarkable. IMPRESSION: Chronic appearing increased interstitial lung markings without evidence of acute  or active cardiopulmonary disease. Electronically Signed   By: Virgina Norfolk M.D.   On: 04/16/2020 17:57      .Critical Care Performed by: Gregor Hams, MD Authorized by: Gregor Hams, MD   Critical care provider statement:    Critical care time (minutes):  30   Critical care time was exclusive of:  Separately billable procedures and treating other patients   Critical care was necessary to treat or prevent imminent or life-threatening deterioration of the following conditions:  Cardiac failure and circulatory failure   Critical care was time spent personally by me on the following activities:  Development of treatment plan with patient or surrogate, discussions with consultants, evaluation of patient's response to treatment, examination of patient, obtaining history from patient or surrogate, ordering and performing treatments and interventions, ordering and review of laboratory studies, ordering and review of radiographic studies, pulse oximetry, re-evaluation of patient's condition and review of old charts     ____________________________________________   INITIAL IMPRESSION / MDM / [place] / ED COURSE  As part of my medical decision making, I reviewed the following data within the electronic MEDICAL RECORD NUMBER   86 year old female presented with above-stated history and physical exam a differential diagnosis including but not limited to ACS, pulmonary emboli.  Patient's repeat troponin is 644 as such heparin infusion initiated.  Patient discussed with Dr. Damita Dunnings for hospital admission for further evaluation and management.  ____________________________________________  FINAL CLINICAL IMPRESSION(S) / ED DIAGNOSES  Final diagnoses:  NSTEMI (non-ST elevated myocardial infarction) ([place])     MEDICATIONS GIVEN DURING THIS VISIT:  Medications  sodium chloride flush (NS) 0.9 % injection 3 mL (has no administration in time range)     ED Discharge Orders      None      *Please note:  Holly Rose was evaluated in Emergency Department on 04/17/2020 for the symptoms described in the history of present illness. She was evaluated in the context of the global COVID-19 pandemic, which necessitated  consideration that the patient might be at risk for infection with the SARS-CoV-2 virus that causes COVID-19. Institutional protocols and algorithms that pertain to the evaluation of patients at risk for COVID-19 are in a state of rapid change based on information released by regulatory bodies including the CDC and federal and state organizations. These policies and algorithms were followed during the patient's care in the ED.  Some ED evaluations and interventions may be delayed as a result of limited staffing during and after the pandemic.*  Note:  This document was prepared using Dragon voice recognition software and may include unintentional dictation errors.   Gregor Hams, MD 04/17/20 [phone]

## 2020-04-17 NOTE — Progress Notes (Signed)
Report called to receiving nurse Sharyn Lull RN for patient being transferred to room 237. Family member at bedside.

## 2020-04-17 NOTE — ED Notes (Signed)
Transfer to [email or web address]

## 2020-04-17 NOTE — Progress Notes (Signed)
Patient ID: Holly Rose, female   DOB: 01/13/1930, 86 y.o.   MRN: [id number] [facility] PROGRESS NOTE  Holly Rose KPT:[id number] DOB: 30-Apr-1930 DOA: 04/17/2020 PCP: Holly Pheasant, MD  HPI/Subjective: Patient admitted with chest pain.  Still having a little pressure in her chest around 2 out of 10 intensity.  Yesterday was about 6 out of 10 in intensity.  No shortness of breath.  Feeling a little weak.  Objective: Vitals:   04/17/20 0736 04/17/20 1215  BP: (!) 148/99 106/65  Pulse: 94 66  Resp: 17 16  Temp: (!) 97.4 F (36.3 C) 97.7 F (36.5 C)  SpO2: 98% 99%    Intake/Output Summary (Last 24 hours) at 04/17/2020 1439 Last data filed at 04/17/2020 0500 Gross per 24 hour  Intake 120 ml  Output --  Net 120 ml   Filed Weights   04/16/20 1731  Weight: 69.4 kg    ROS: Review of Systems  Respiratory: Negative for shortness of breath.   Cardiovascular: Positive for chest pain.  Gastrointestinal: Negative for abdominal pain, nausea and vomiting.   Exam: Physical Exam HENT:     Nose: No mucosal edema.     Mouth/Throat:     Pharynx: No oropharyngeal exudate.  Eyes:     General: Lids are normal.     Conjunctiva/sclera: Conjunctivae normal.     Pupils: Pupils are equal, round, and reactive to light.  Cardiovascular:     Rate and Rhythm: Normal rate. Rhythm irregularly irregular.     Heart sounds: Normal heart sounds, S1 normal and S2 normal.  Pulmonary:     Breath sounds: No decreased breath sounds, wheezing, rhonchi or rales.  Abdominal:     Palpations: Abdomen is soft.     Tenderness: There is no abdominal tenderness.  Musculoskeletal:     Right ankle: No swelling.     Left ankle: No swelling.  Skin:    General: Skin is warm.     Findings: No rash.  Neurological:     Mental Status: She is alert and oriented to person, place, and time.       Data Reviewed: Basic Metabolic Panel: Recent Labs  Lab 04/15/20 1540 04/16/20 1737 04/17/20 0613  NA  137 137 138  K 4.1 4.3 3.8  CL 103 105 105  CO2 23 24 24   GLUCOSE 152* 112* 126*  BUN 9 12 9   CREATININE 0.65 0.60 0.54  CALCIUM 9.1 9.7 9.1   Liver Function Tests: Recent Labs  Lab 04/15/20 1540  AST 32  ALT 29  ALKPHOS 42*  BILITOT 0.3  PROT 7.4  ALBUMIN 3.6   CBC: Recent Labs  Lab 04/15/20 1540 04/16/20 1737 04/17/20 0613  WBC 3.6 4.5 3.6*  NEUTROABS 2.1  --   --   HGB 11.4 11.6* 11.4*  HCT 36.8 36.7 35.8*  MCV 91 91.8 90.9  PLT 166 179 167   CBG: Recent Labs  Lab 04/17/20 0737 04/17/20 1214  GLUCAP 117* 132*    Recent Results (from the past 240 hour(s))  SARS Coronavirus 2 by RT PCR (hospital order, performed in [facility] hospital lab) Nasopharyngeal Nasopharyngeal Swab     Status: None   Collection Time: 04/17/20  2:32 AM   Specimen: Nasopharyngeal Swab  Result Value Ref Range Status   SARS Coronavirus 2 NEGATIVE NEGATIVE Final    Comment: (NOTE) SARS-CoV-2 target nucleic acids are NOT DETECTED.  The SARS-CoV-2 RNA is generally detectable in upper and lower respiratory specimens during the  acute phase of infection. The lowest concentration of SARS-CoV-2 viral copies this assay can detect is 250 copies / mL. A negative result does not preclude SARS-CoV-2 infection and should not be used as the sole basis for treatment or other patient management decisions.  A negative result may occur with improper specimen collection / handling, submission of specimen other than nasopharyngeal swab, presence of viral mutation(s) within the areas targeted by this assay, and inadequate number of viral copies (<250 copies / mL). A negative result must be combined with clinical observations, patient history, and epidemiological information.  Fact Sheet for Patients:   StrictlyIdeas.no  Fact Sheet for Healthcare Providers: BankingDealers.co.za  This test is not yet approved or  cleared by the [place] FDA and has  been authorized for detection and/or diagnosis of SARS-CoV-2 by FDA under an Emergency Use Authorization (EUA).  This EUA will remain in effect (meaning this test can be used) for the duration of the COVID-19 declaration under Section 564(b)(1) of the Act, 21 U.S.C. section 360bbb-3(b)(1), unless the authorization is terminated or revoked sooner.  Performed at [facility], [address] [place], [place] [address]   MRSA PCR Screening     Status: None   Collection Time: 04/17/20  8:55 AM   Specimen: Nasopharyngeal  Result Value Ref Range Status   MRSA by PCR NEGATIVE NEGATIVE Final    Comment:        The GeneXpert MRSA Assay (FDA approved for NASAL specimens only), is one component of a comprehensive MRSA colonization surveillance program. It is not intended to diagnose MRSA infection nor to guide or monitor treatment for MRSA infections. Performed at [facility], [address] [place], [place] [address]      Studies: DG Chest 2 View  Result Date: 04/16/2020 CLINICAL DATA:  Chest pain. EXAM: CHEST - 2 VIEW COMPARISON:  June 24, 2010 FINDINGS: The lungs are hyperinflated. A single lead ventricular pacer is noted. Mild, diffuse chronic appearing increased interstitial lung markings are seen. There is no evidence of acute infiltrate, pleural effusion or pneumothorax. The heart size and mediastinal contours are within normal limits. There is mild calcification of the aortic arch. The visualized skeletal structures are unremarkable. IMPRESSION: Chronic appearing increased interstitial lung markings without evidence of acute or active cardiopulmonary disease. Electronically Signed   By: Virgina Norfolk M.D.   On: 04/16/2020 17:57    Scheduled Meds: . [START ON 04/18/2020] aspirin EC  81 mg Oral Daily  . atorvastatin  40 mg Oral Daily  . clopidogrel  75 mg Oral Daily  . insulin aspart  0-15 Units Subcutaneous TID WC  . insulin aspart  0-5 Units Subcutaneous  QHS  . isosorbide mononitrate  30 mg Oral Daily  . metoprolol tartrate  25 mg Oral BID  . sodium chloride flush  3 mL Intravenous Once  . tamoxifen  20 mg Oral Daily    Assessment/Plan:  1. Chest pain with elevated troponin versus NSTEMI.  Medical management with aspirin, Plavix, Imdur and metoprolol.  Cardiology stopped heparin drip.  Atorvastatin. 2. Chronic atrial fibrillation on metoprolol 25 mg twice a day 3. Hyperlipidemia unspecified on atorvastatin.  LDL 80. 4. Left-sided breast cancer on tamoxifen treatment   Code Status:     Code Status Orders  (From admission, onward)         Start     Ordered   04/17/20 0225  Full code  Continuous        04/17/20 0227  Code Status History    Date Active Date Inactive Code Status Order ID Comments User Context   08/15/2018 2255 08/17/2018 1644 Full Code [id number]  Lance Coon, MD Inpatient   Advance Care Planning Activity     Family Communication: Daughter and husband at the bedside Disposition Plan: Status is: Inpatient  Dispo: The patient is from: Home              Anticipated d/c is to: Home              Anticipated d/c date is: Potential 04/18/2020 depending on how she is doing and feeling with medical management.              Patient currently being treated for elevated troponin and chest pain.  Family deferred invasive procedure at this time and would like to do medical management.  Time spent: 27 minutes  [place]

## 2020-04-17 NOTE — Progress Notes (Signed)
ANTICOAGULATION CONSULT NOTE - Initial Consult  Pharmacy Consult for Heparin Indication: ACS/STEMI  Allergies  Allergen Reactions   Lyrica [Pregabalin] Swelling    Sleepy, does not eat   Neurontin [Gabapentin] Nausea And Vomiting and Other (See Comments)    disoriented   Bactrim [Sulfamethoxazole-Trimethoprim]     Made her dizziness    Patient Measurements: Height: 5' 2.01" (157.5 cm) Weight: 69.4 kg (153 lb) IBW/kg (Calculated) : 50.12 HEPARIN DW (KG): 64.7  Vital Signs: Temp: 97.5 F (36.4 C) (08/04 0016) Temp Source: Oral (08/04 0016) BP: 145/75 (08/04 0016) Pulse Rate: 82 (08/04 0016)  Labs: Recent Labs    04/15/20 1540 04/16/20 1737 04/16/20 2338 04/17/20 0107 04/17/20 0108  HGB 11.4 11.6*  --   --   --   HCT 36.8 36.7  --   --   --   PLT 166 179  --   --   --   APTT  --   --   --  26  --   LABPROT  --   --   --  14.7  --   INR  --   --   --  1.2  --   CREATININE 0.65 0.60  --   --   --   TROPONINIHS  --  16 644*  --  1,031*    Estimated Creatinine Clearance: 44.4 mL/min (by C-G formula based on SCr of 0.6 mg/dL).   Medical History: Past Medical History:  Diagnosis Date   Allergy    Anemia    Arthritis    Atrial fibrillation ([place]) 2004   Breast cancer, left ([place]) 10/2017   Lumpectomy and rad tx's.    CAD (coronary artery disease)    Complication of anesthesia    hard to wake up with general anesthesia   Diabetes ([place])    Dysrhythmia    A-fib   Heart attack ([place]) 2008   Heart murmur    History of kidney stones    History of sick sinus syndrome    s/p pacemaker placement   Hyperlipidemia    Macular degeneration    Neuropathy    Neuropathy    Pacemaker 06/2010   Personal history of radiation therapy 11/2017   LEFT lumpectomy    Squamous cell skin cancer    TIA (transient ischemic attack)     Medications:  (Not in a hospital admission)   Assessment: No anticoagulants PTA per med list.  Baseline labs ordered.   Heparin initiated for ACS.  Goal of Therapy:  Heparin level 0.3-0.7 units/ml Monitor platelets by anticoagulation protocol: Yes   Plan:  Heparin bolus 3000 units then infusion at 750 units/hr Check HL ~8 hours after heparin started  Hart Robinsons A 04/17/2020,2:03 AM

## 2020-04-18 ENCOUNTER — Other Ambulatory Visit: Payer: Self-pay | Admitting: Cardiology

## 2020-04-18 ENCOUNTER — Other Ambulatory Visit: Payer: Self-pay

## 2020-04-18 DIAGNOSIS — R079 Chest pain, unspecified: Secondary | ICD-10-CM | POA: Diagnosis not present

## 2020-04-18 DIAGNOSIS — I214 Non-ST elevation (NSTEMI) myocardial infarction: Secondary | ICD-10-CM | POA: Diagnosis not present

## 2020-04-18 DIAGNOSIS — E785 Hyperlipidemia, unspecified: Secondary | ICD-10-CM | POA: Diagnosis not present

## 2020-04-18 DIAGNOSIS — I482 Chronic atrial fibrillation, unspecified: Secondary | ICD-10-CM | POA: Diagnosis not present

## 2020-04-18 DIAGNOSIS — C50912 Malignant neoplasm of unspecified site of left female breast: Secondary | ICD-10-CM | POA: Diagnosis not present

## 2020-04-18 LAB — GLUCOSE, CAPILLARY: Glucose-Capillary: 130 mg/dL — ABNORMAL HIGH (ref 70–99)

## 2020-04-18 LAB — URINE CULTURE
MICRO NUMBER:: 10776210
SPECIMEN QUALITY:: ADEQUATE

## 2020-04-18 MED ORDER — ATORVASTATIN CALCIUM 40 MG PO TABS: 40.0000 mg | ORAL_TABLET | Freq: Every day | ORAL | 0 refills | Status: AC

## 2020-04-18 MED ORDER — CLOPIDOGREL BISULFATE 75 MG PO TABS: 75.0000 mg | ORAL_TABLET | Freq: Every day | ORAL | 0 refills | Status: DC

## 2020-04-18 MED ORDER — LISINOPRIL 5 MG PO TABS: 5.0000 mg | ORAL_TABLET | Freq: Every day | ORAL | 11 refills | Status: DC

## 2020-04-18 MED ORDER — ISOSORBIDE MONONITRATE ER 30 MG PO TB24: 30.0000 mg | ORAL_TABLET | Freq: Every day | ORAL | 0 refills | Status: AC

## 2020-04-18 MED ORDER — FUROSEMIDE 20 MG PO TABS: 20.0000 mg | ORAL_TABLET | Freq: Two times a day (BID) | ORAL | Status: DC

## 2020-04-18 MED ORDER — CEPHALEXIN 500 MG PO CAPS
500.0000 mg | ORAL_CAPSULE | Freq: Two times a day (BID) | ORAL | 0 refills | Status: DC
Start: 2020-04-18 — End: 2020-05-01

## 2020-04-18 NOTE — Care Management CC44 (Signed)
Condition Code 44 Documentation Completed  Patient Details  Name: Holly Rose MRN: [id number] Date of Birth: 1929-11-01   Condition Code 44 given:  Yes Patient signature on Condition Code 44 notice:  Yes Documentation of 2 MD's agreement:  Yes Code 44 added to claim:  Yes    Angenette Daily A Nichalos Brenton, LCSW 04/18/2020, 9:07 AM

## 2020-04-18 NOTE — Care Management Obs Status (Signed)
[facility] NOTIFICATION   Patient Details  Name: Holly Rose MRN: [id number] Date of Birth: 1930/02/12   Medicare Observation Status Notification Given:  Yes    Jannatul Wojdyla A Hubbert Landrigan, LCSW 04/18/2020, 9:07 AM

## 2020-04-18 NOTE — Discharge Summary (Signed)
[facility] [place] NAME: Holly Rose    MR#:  [id number]  DATE OF BIRTH:  08-08-1930  DATE OF ADMISSION:  04/17/2020 ADMITTING PHYSICIAN: Loletha Grayer, MD  DATE OF DISCHARGE: 04/18/2020 11:53 AM  PRIMARY CARE PHYSICIAN: [name], MD    ADMISSION DIAGNOSIS:  NSTEMI (non-ST elevated myocardial infarction) ([place]) [I21.4]  DISCHARGE DIAGNOSIS:  Principal Problem:   NSTEMI (non-ST elevated myocardial infarction) ([place]) Active Problems:   CAD (coronary artery disease)   Atrial fibrillation, chronic   Diabetes mellitus with neuropathy ([place])   Hyperlipidemia   Malignant neoplasm of left female breast ([place])   SECONDARY DIAGNOSIS:   Past Medical History:  Diagnosis Date  . Allergy   . Anemia   . Arthritis   . Atrial fibrillation ([place]) 2004  . Breast cancer, left ([place]) 10/2017   Lumpectomy and rad tx's.   Mar[place] Kitchen CAD (coronary artery disease)   . Complication of anesthesia    hard to wake up with general anesthesia  . Diabetes ([place])   . Dysrhythmia    A-fib  . Heart attack ([place]) 2008  . Heart murmur   . History of kidney stones   . History of sick sinus syndrome    s/p pacemaker placement  . Hyperlipidemia   . Macular degeneration   . Neuropathy   . Neuropathy   . Pacemaker 06/2010  . Personal history of radiation therapy 11/2017   LEFT lumpectomy   . Squamous cell skin cancer   . TIA (transient ischemic attack)     HOSPITAL COURSE:   1.  NSTEMI with chest pain with elevated troponin.  Patient seen by cardiology and with discussion with patient and family medical management was decided.  Continue aspirin, Plavix, imdur and metoprolol.  Atorvastatin prescribed.  LDL 80.  Cardiac rehab referral.  Troponin peaked at 1654.  Patient not having any chest pain upon disposition. 2.  Chronic atrial fibrillation on metoprolol 25 mg twice a day.  Patient on aspirin and Plavix 3.  Hyperlipidemia unspecified on atorvastatin 4.   Left-sided breast cancer on tamoxifen treatment 5.  Home health set up  DISCHARGE CONDITIONS:   Satisfactory  CONSULTS OBTAINED:  Treatment Team:  Teodoro Spray, MD  DRUG ALLERGIES:   Allergies  Allergen Reactions  . Lyrica [Pregabalin] Swelling    Sleepy, does not eat  . Neurontin [Gabapentin] Nausea And Vomiting and Other (See Comments)    disoriented  . Bactrim [Sulfamethoxazole-Trimethoprim]     Made her dizziness    DISCHARGE MEDICATIONS:   Allergies as of 04/18/2020      Reactions   Lyrica [pregabalin] Swelling   Sleepy, does not eat   Neurontin [gabapentin] Nausea And Vomiting, Other (See Comments)   disoriented   Bactrim [sulfamethoxazole-trimethoprim]    Made her dizziness      Medication List    TAKE these medications   alendronate 70 MG tablet Commonly known as: FOSAMAX Take 1 tablet (70 mg total) by mouth once a week. Take with a full glass of water on an empty stomach.   aspirin 81 MG tablet Take 81 mg by mouth daily.   atorvastatin 40 MG tablet Commonly known as: LIPITOR Take 1 tablet (40 mg total) by mouth daily.   blood glucose meter kit and supplies Per patient please dispense One touch Ultra2 meter.Use meter and supplies three times a day to check blood sugar. ICD10: E11.40   clopidogrel 75 MG tablet Commonly known as: PLAVIX Take  1 tablet (75 mg total) by mouth daily.   Eylea 2 MG/0.05ML Soln Generic drug: Aflibercept 1 each by Intravitreal route as directed.   freestyle lancets 2 (two) times daily.   FreeStyle Lite Devi 2 (two) times daily.   furosemide 20 MG tablet Commonly known as: LASIX Take 1 tablet (20 mg total) by mouth 2 (two) times daily. What changed: how much to take   glucose blood test strip Use as instructed to check sugars twice daily. Dx E11.9   HAIR/SKIN/NAILS PO Take 3 tablets by mouth daily. 2 in the am, 1 in the pm   isosorbide mononitrate 30 MG 24 hr tablet Commonly known as: IMDUR Take 1 tablet  (30 mg total) by mouth daily.   Magnesium 200 MG Tabs Take 200 mg by mouth 2 (two) times a day.   meclizine 12.5 MG tablet Commonly known as: ANTIVERT Take 1 tablet (12.5 mg total) by mouth 2 (two) times daily as needed for dizziness.   metFORMIN 500 MG 24 hr tablet Commonly known as: GLUCOPHAGE-XR Take 2 tablets by mouth twice daily What changed: how much to take   metoprolol tartrate 50 MG tablet Commonly known as: LOPRESSOR Take 25 mg by mouth 2 (two) times daily.   PRESERVISION AREDS 2 PO Take 1 tablet by mouth 2 (two) times daily.   PROBIOTIC DAILY PO Take by mouth.   SYSTANE OP Apply 1 drop to eye 2 (two) times daily.   tamoxifen 20 MG tablet Commonly known as: NOLVADEX Take 1 tablet by mouth once daily   vitamin B-12 500 MCG tablet Commonly known as: CYANOCOBALAMIN Take 500 mcg by mouth 2 (two) times a day.   Vitamin D 50 MCG (2000 UT) Caps Take 4,000 Units by mouth daily.        DISCHARGE INSTRUCTIONS:   Follow-up PMD 5 days Follow-up cardiology 1 week  If you experience worsening of your admission symptoms, develop shortness of breath, life threatening emergency, suicidal or homicidal thoughts you must seek medical attention immediately by calling 911 or calling your MD immediately  if symptoms less severe.  You Must read complete instructions/literature along with all the possible adverse reactions/side effects for all the Medicines you take and that have been prescribed to you. Take any new Medicines after you have completely understood and accept all the possible adverse reactions/side effects.   Please note  You were cared for by a hospitalist during your hospital stay. If you have any questions about your discharge medications or the care you received while you were in the hospital after you are discharged, you can call the unit and asked to speak with the hospitalist on call if the hospitalist that took care of you is not available. Once you are  discharged, your primary care physician will handle any further medical issues. Please note that NO REFILLS for any discharge medications will be authorized once you are discharged, as it is imperative that you return to your primary care physician (or establish a relationship with a primary care physician if you do not have one) for your aftercare needs so that they can reassess your need for medications and monitor your lab values.    Today   CHIEF COMPLAINT:   Chief Complaint  Patient presents with  . Chest Pain    HISTORY OF PRESENT ILLNESS:  Holly Rose  is a 86 y.o. female came in with chest pain   VITAL SIGNS:  Blood pressure (!) 141/94, pulse 91, temperature 97.8 F (  36.6 C), temperature source Oral, resp. rate 18, height 5' 2.01" (1.575 m), weight 69.4 kg, SpO2 97 %.  I/O:    Intake/Output Summary (Last 24 hours) at 04/18/2020 1724 Last data filed at 04/18/2020 1007 Gross per 24 hour  Intake --  Output 1000 ml  Net -1000 ml    PHYSICAL EXAMINATION:  GENERAL:  86 y.o.-year-old patient lying in the bed with no acute distress.  EYES: Pupils equal, round, reactive to light and accommodation. No scleral icterus. Extraocular muscles intact.  HEENT: Head atraumatic, normocephalic. Oropharynx and nasopharynx clear.   LUNGS: Normal breath sounds bilaterally, no wheezing, rales,rhonchi or crepitation. No use of accessory muscles of respiration.  CARDIOVASCULAR: S1, S2 normal. No murmurs, rubs, or gallops.  ABDOMEN: Soft, non-tender, non-distended.  EXTREMITIES: No pedal edema.  NEUROLOGIC: Cranial nerves II through XII are intact. Muscle strength 5/5 in all extremities. Sensation intact. Gait not checked.  PSYCHIATRIC: The patient is alert and oriented x 3.  SKIN: No obvious rash, lesion, or ulcer.   DATA REVIEW:   CBC Recent Labs  Lab 04/17/20 0613  WBC 3.6*  HGB 11.4*  HCT 35.8*  PLT 167    Chemistries  Recent Labs  Lab 04/15/20 1540 04/16/20 1737  04/17/20 0613  NA 137   < > 138  K 4.1   < > 3.8  CL 103   < > 105  CO2 23   < > 24  GLUCOSE 152*   < > 126*  BUN 9   < > 9  CREATININE 0.65   < > 0.54  CALCIUM 9.1   < > 9.1  AST 32  --   --   ALT 29  --   --   ALKPHOS 42*  --   --   BILITOT 0.3  --   --    < > = values in this interval not displayed.    Microbiology Results  Results for orders placed or performed during the hospital encounter of 04/17/20  SARS Coronavirus 2 by RT PCR (hospital order, performed in [facility] hospital lab) Nasopharyngeal Nasopharyngeal Swab     Status: None   Collection Time: 04/17/20  2:32 AM   Specimen: Nasopharyngeal Swab  Result Value Ref Range Status   SARS Coronavirus 2 NEGATIVE NEGATIVE Final    Comment: (NOTE) SARS-CoV-2 target nucleic acids are NOT DETECTED.  The SARS-CoV-2 RNA is generally detectable in upper and lower respiratory specimens during the acute phase of infection. The lowest concentration of SARS-CoV-2 viral copies this assay can detect is 250 copies / mL. A negative result does not preclude SARS-CoV-2 infection and should not be used as the sole basis for treatment or other patient management decisions.  A negative result may occur with improper specimen collection / handling, submission of specimen other than nasopharyngeal swab, presence of viral mutation(s) within the areas targeted by this assay, and inadequate number of viral copies (<250 copies / mL). A negative result must be combined with clinical observations, patient history, and epidemiological information.  Fact Sheet for Patients:   StrictlyIdeas.no  Fact Sheet for Healthcare Providers: BankingDealers.co.za  This test is not yet approved or  cleared by the [place] FDA and has been authorized for detection and/or diagnosis of SARS-CoV-2 by FDA under an Emergency Use Authorization (EUA).  This EUA will remain in effect (meaning this test can be  used) for the duration of the COVID-19 declaration under Section 564(b)(1) of the Act, 21 U.S.C. section 360bbb-3(b)(1), unless  the authorization is terminated or revoked sooner.  Performed at [facility], [address] [place], [place] [address]   MRSA PCR Screening     Status: None   Collection Time: 04/17/20  8:55 AM   Specimen: Nasopharyngeal  Result Value Ref Range Status   MRSA by PCR NEGATIVE NEGATIVE Final    Comment:        The GeneXpert MRSA Assay (FDA approved for NASAL specimens only), is one component of a comprehensive MRSA colonization surveillance program. It is not intended to diagnose MRSA infection nor to guide or monitor treatment for MRSA infections. Performed at [facility], [address] [place], [place] [address]     RADIOLOGY:  DG Chest 2 View  Result Date: 04/16/2020 CLINICAL DATA:  Chest pain. EXAM: CHEST - 2 VIEW COMPARISON:  June 24, 2010 FINDINGS: The lungs are hyperinflated. A single lead ventricular pacer is noted. Mild, diffuse chronic appearing increased interstitial lung markings are seen. There is no evidence of acute infiltrate, pleural effusion or pneumothorax. The heart size and mediastinal contours are within normal limits. There is mild calcification of the aortic arch. The visualized skeletal structures are unremarkable. IMPRESSION: Chronic appearing increased interstitial lung markings without evidence of acute or active cardiopulmonary disease. Electronically Signed   By: Virgina Norfolk M.D.   On: 04/16/2020 17:57     Management plans discussed with the patient, family and they are in agreement.  CODE STATUS:  Code Status History    Date Active Date Inactive Code Status Order ID Comments User Context   04/17/2020 0227 04/18/2020 1707 Full Code [id number]  Athena Masse, MD ED   08/15/2018 2255 08/17/2018 1644 Full Code [id number]  Lance Coon, MD Inpatient   Advance Care Planning Activity    Questions  for Most Recent Historical Code Status (Order [id number])       TOTAL TIME TAKING CARE OF THIS PATIENT: 35 minutes.    Loletha Grayer M.D on 04/18/2020 at 5:24 PM  Between 7am to 6pm - Pager - [phone]  After 6pm go to [email or web address] - password [email or web address]  [facility]  CC: Primary care physician; [name], MD

## 2020-04-18 NOTE — TOC Transition Note (Signed)
Transition of Care [facility]) - CM/SW Discharge Note   Patient Details  Name: SIDNEY SILBERMAN MRN: [id number] Date of Birth: 11/09/1929  [facility]) CM/SW Contact:  Eileen Stanford, LCSW Phone Number: 04/18/2020, 9:19 AM   Clinical Narrative:   Pt will d/c home with home health today. Pt's daughter and pt's spouse here to transport pt home. Home health is arranged. No additional equipment needed. Pt's family will transport pt home. No additional needs at this time.    Final next level of care: Home w Home Health Services Barriers to Discharge: No Barriers Identified   Patient Goals and CMS Choice Patient states their goals for this hospitalization and ongoing recovery are:: to go home   Choice offered to / list presented to : Spouse, Adult Children, Patient  Discharge Placement                Patient to be transferred to facility by: transfering home via family vehicle Name of family member notified: pt's daughter and pt's spouse Patient and family notified of of transfer: 04/18/20  Discharge Plan and Services     Post Acute Care Choice: [place]: PT [place]: [place] ([facility]) Date [facility] Agency Contacted: 04/18/20 Time [place]: [phone] Representative spoke with at [place]: [place] ([place]) Interventions     Readmission Risk Interventions No flowsheet data found.

## 2020-04-18 NOTE — Telephone Encounter (Signed)
I have placed the order for the neurology referral.  Someone should be contacting you with an appt date and time.

## 2020-04-18 NOTE — Telephone Encounter (Signed)
They need to let the MD taking care of her in the hospital to know if the concerns and they can request a neurology consult while she is in the hospital (if they feel this is appropriate).

## 2020-04-18 NOTE — TOC Initial Note (Signed)
Transition of Care [facility]) - Initial/Assessment Note    Patient Details  Name: Holly Rose MRN: [id number] Date of Birth: 06/05/1930  Transition of Care [facility]) CM/SW Contact:    Eileen Stanford, LCSW Phone Number: 04/18/2020, 9:17 AM  Clinical Narrative:  CSW spoke with pt, pt's spouse, and pt's daughter at bedside. They are in agreement for [facility]. Pt's spouse has had HH recently. There was not a agency preference. Pt states she has a walker at home and doesn't need any additional equipment.  [facility] will service pt. No additional needs at this time.                 Expected Discharge Plan: [place]     Patient Goals and CMS Choice Patient states their goals for this hospitalization and ongoing recovery are:: to go home   Choice offered to / list presented to : Spouse, Adult Children, Patient  Expected Discharge Plan and Services Expected Discharge Plan: [place] Choice: [place] arrangements for the past 2 months: [place] Expected Discharge Date: 04/18/20                         HH Arranged: PT, OT [place] Agency: [place] ([place]) Date HH Agency Contacted: 04/18/20 Time HH Agency Contacted: [phone] Representative spoke with at [place]: [place] Arrangements/Services Living arrangements for the past 2 months: [place] Lives with:: Spouse Patient language and need for interpreter reviewed:: Yes Do you feel safe going back to the place where you live?: Yes      Need for Family Participation in Patient Care: Yes (Comment) Care giver support system in place?: Yes (comment)   Criminal Activity/Legal Involvement Pertinent to Current Situation/Hospitalization: No - Comment as needed  Activities of Daily Living Home Assistive Devices/Equipment: Grab bars around toilet, Grab bars in shower ADL Screening (condition at time of admission) Patient's  cognitive ability adequate to safely complete daily activities?: Yes Is the patient deaf or have difficulty hearing?: No Does the patient have difficulty seeing, even when wearing glasses/contacts?: No Does the patient have difficulty concentrating, remembering, or making decisions?: No Patient able to express need for assistance with ADLs?: Yes Does the patient have difficulty dressing or bathing?: No Independently performs ADLs?: Yes (appropriate for developmental age) Does the patient have difficulty walking or climbing stairs?: Yes Weakness of Legs: Both Weakness of Arms/Hands: None  Permission Sought/Granted Permission sought to share information with : Family Supports Permission granted to share information with : Yes, Verbal Permission Granted  Share Information with NAME: [name]  Permission granted to share info w AGENCY: [facility]  Permission granted to share info w Relationship: Spouse     Emotional Assessment Appearance:: Appears stated age Attitude/Demeanor/Rapport: Engaged Affect (typically observed): Accepting, Appropriate Orientation: : Oriented to Place, Oriented to  Time, Oriented to Situation Alcohol / Substance Use: Not Applicable Psych Involvement: No (comment)  Admission diagnosis:  NSTEMI (non-ST elevated myocardial infarction) [facility]) [I21.4] Patient Active Problem List   Diagnosis Date Noted  . NSTEMI (non-ST elevated myocardial infarction) ([place]) 04/17/2020  . Atrial fibrillation with RVR ([place]) 04/17/2020  . Change in mental status 04/15/2020  . Head injury 04/15/2020  . Dysuria 04/30/2019  . Frequent urinary tract infections 04/30/2019  . Age-related osteoporosis without current pathological fracture 03/01/2019  . Dizziness 08/29/2018  . UTI (urinary tract  infection) 08/15/2018  . Vitamin D deficiency, unspecified 06/16/2017  . Hypercalcinuria 06/14/2017  . SCC (squamous cell carcinoma), face 03/29/2017  . History of kidney stones 03/12/2017  . Malignant  neoplasm of left female breast ([place]) 12/28/2016  . Chronic cystitis 11/05/2016  . Nephrolithiasis 11/05/2016  . Renal colic 40/37/5436  . Urge incontinence 11/05/2016  . Bilateral hand pain 11/03/2016  . Generalized osteoarthritis of hand 11/03/2016  . Numbness and tingling in both hands 11/03/2016  . Bradycardia 04/15/2016  . History of colonic polyps 09/16/2015  . Abdominal pain 08/18/2015  . Rectal bleeding 08/18/2015  . CAD (coronary artery disease) 02/17/2015  . Atrial fibrillation, chronic 02/17/2015  . Diabetes mellitus with neuropathy ([place]) 02/17/2015  . Hyperlipidemia 02/17/2015  . Iron deficiency anemia 02/17/2015  . Macular degeneration 02/17/2015  . Stress 02/17/2015  . Health care maintenance 02/17/2015  . Diarrhea 02/17/2015  . Type II diabetes mellitus ([place]) 07/13/2014  . Diverticulosis 03/31/2013  . GI bleeding 03/31/2013  . Mitral valve prolapse 03/31/2013  . Diverticulosis of intestine without perforation or abscess without bleeding 03/31/2013   PCP:  [name], MD Pharmacy:   [facility] [address] [place] [address] [place] [address] [place] [address] Phone: [phone] Fax: [phone]     Social Determinants of Health (SDOH) Interventions    Readmission Risk Interventions No flowsheet data found.

## 2020-04-18 NOTE — Discharge Instructions (Signed)
Heart Attack A heart attack occurs when blood and oxygen supply to the heart is cut off. A heart attack causes damage to the heart that cannot be fixed. A heart attack is also called a myocardial infarction, or MI. If you think you are having a heart attack, do not wait to see if the symptoms will go away. Get medical help right away. What are the causes? This condition may be caused by:  A fatty substance (plaque) in the blood vessels (arteries). This can block the flow of blood to the heart.  A blood clot in the blood vessels that go to the heart. The blood clot blocks blood flow.  Low blood pressure.  An abnormal heartbeat.  Some diseases, such as problems in red blood cells (anemia)orproblems in breathing (respiratory failure).  Tightening (spasm) of a blood vessel that cuts off blood to the heart.  A tear in a blood vessel of the heart.  High blood pressure. What increases the risk? The following factors may make you more likely to develop this condition:  Aging. The older you are, the higher your risk.  Having a personal or family history of chest pain, heart attack, stroke, or narrowing of the arteries in the legs, arms, head, or stomach (peripheral artery disease).  Being female.  Smoking.  Not getting regular exercise.  Being overweight or obese.  Having high blood pressure.  Having high cholesterol.  Having diabetes.  Drinking too much alcohol.  Using illegal drugs, such as cocaine or methamphetamine. What are the signs or symptoms? Symptoms of this condition include:  Chest pain. It may feel like: ? Crushing or squeezing. ? Tightness, pressure, fullness, or heaviness.  Pain in the arm, neck, jaw, back, or upper body.  Shortness of breath.  Heartburn.  Upset stomach (indigestion).  Feeling like you may vomit (nauseous).  Cold sweats.  Feeling tired.  Sudden light-headedness. How is this treated? A heart attack must be treated as soon as  possible. Treatment may include:  Medicines to: ? Break up or dissolve blood clots. ? Thin blood and help prevent blood clots. ? Treat blood pressure. ? Improve blood flow to the heart. ? Reduce pain. ? Reduce cholesterol.  Procedures to widen a blocked artery and keep it open.  Open heart surgery.  Receiving oxygen.  Making your heart strong again (cardiac rehabilitation) through exercise, education, and counseling. Follow these instructions at home: Medicines  Take over-the-counter and prescription medicines only as told by your doctor. You may need to take medicine: ? To keep your blood from clotting too easily. ? To control blood pressure. ? To lower cholesterol. ? To control heart rhythms.  Do not take these medicines unless your doctor says it is okay: ? NSAIDs, such as ibuprofen. ? Supplements that have vitamin A, vitamin E, or both. ? Hormone replacement therapy that has estrogen with or without progestin. Lifestyle      Do not use any products that have nicotine or tobacco, such as cigarettes, e-cigarettes, and chewing tobacco. If you need help quitting, ask your doctor.  Avoid secondhand smoke.  Exercise regularly. Ask your doctor about a cardiac rehab program.  Eat heart-healthy foods. Your doctor will tell you what foods to eat.  Stay at a healthy weight.  Lower your stress level.  Do not use illegal drugs. Alcohol use  Do not drink alcohol if: ? Your doctor tells you not to drink. ? You are pregnant, may be pregnant, or are planning to become pregnant.    If you drink alcohol: ? Limit how much you use to:  0-1 drink a day for women.  0-2 drinks a day for men. ? Know how much alcohol is in your drink. In the [place] one drink equals one 12 oz bottle of beer (355 mL), one 5 oz glass of wine (148 mL), or one 1 oz glass of hard liquor (44 mL). General instructions  Work with your doctor to treat other problems you may have, such as diabetes or high  blood pressure.  Get screened for depression. Get treatment if needed.  Keep your vaccines up to date. Get the flu shot (influenza vaccine) every year.  Keep all follow-up visits as told by your doctor. This is important. Contact a doctor if:  You feel very sad.  You have trouble doing your daily activities. Get help right away if:  You have sudden, unexplained discomfort in your chest, arms, back, neck, jaw, or upper body.  You have shortness of breath.  You have sudden sweating or clammy skin.  You feel like you may vomit.  You vomit.  You feel tired or weak.  You get light-headed or dizzy.  You feel your heart beating fast.  You feel your heart skipping beats.  You have blood pressure that is higher than 180/120. These symptoms may be an emergency. Do not wait to see if the symptoms will go away. Get medical help right away. Call your local emergency services (911 in the [place] Do not drive yourself to the hospital. Summary  A heart attack occurs when blood and oxygen supply to the heart is cut off.  Do not take NSAIDs unless your doctor says it is okay.  Do not smoke. Avoid secondhand smoke.  Exercise regularly. Ask your doctor about a cardiac rehab program. This information is not intended to replace advice given to you by your health care provider. Make sure you discuss any questions you have with your health care provider. Document Revised: 12/12/2018 Document Reviewed: 12/12/2018 Elsevier Patient Education  2020 [employer]  

## 2020-04-18 NOTE — Progress Notes (Signed)
[facility] Cardiology    SUBJECTIVE: Ms. Ueda is an 86 year old female with a past medical history significant for longstanding persistent atrial fibrillation, no longer on anticoagulation due to persistent epistaxis, history of a high grade heart block s/p permanent pacemaker implantation, type 2 diabetes, and hyperlipidemia who presented to the ED on 04/16/20 for chest pain.  Workup in the ED was significant for high sensitivity troponin of 16, 644, 1031, and 1654 respectively, ECG revealing atrial fibrillation at a controlled ventricular response; IVCD limits ischemic interpretation, and chest xray revealing no evidence of acute cardiopulmonary disease.    She is followed in outpatient cardiology by Dr. Ubaldo Glassing.  Most recent echocardiogram in 2016 revealed normal RV and LV systolic function with an EF estimated greater than 55% with mild LVH, mild MR, and mild TR.   04/17/20:  Ms. Robers is currently sitting up in bed, comfortably, with only mild chest tightness.  She reports symptoms have significantly improved.  She also denies shortness of breath, lower extremity swelling, orthopnea, or PND.  She is currently being worked up in an outpatient setting for a possible CVA following symptoms of acute delirium.   04/18/20: Ms. Barse is sitting up in her chair in no acute distress.  She denies any recurrent chest pain or chest tightness.  She denies palpitations or heart racing sensation.  She denies lower extremity swelling, orthopnea, PND, or syncopal/presyncopal episodes. She denies any evidence of bleeding with addition of Plavix.    Vitals:   04/17/20 1545 04/17/20 2006 04/18/20 0439 04/18/20 0736  BP: (!) 108/54 140/71 112/80 (!) 141/94  Pulse: 82 96 83 91  Resp: 14 19 16 18   Temp: 97.9 F (36.6 C) 97.7 F (36.5 C) 97.7 F (36.5 C) 97.8 F (36.6 C)  TempSrc: Oral Oral Oral Oral  SpO2: 98% 96% 95% 97%  Weight:      Height:         Intake/Output Summary (Last 24 hours) at 04/18/2020 0741 Last  data filed at 04/18/2020 8295 Gross per 24 hour  Intake --  Output 800 ml  Net -800 ml      PHYSICAL EXAM  General: Well developed, well nourished, in no acute distress HEENT:  Normocephalic and atramatic Neck:  No JVD.  Lungs: Clear bilaterally to auscultation and percussion. Heart: Irregularly irregular, varying ventricular response . Normal S1 and S2 without gallops or murmurs.  Abdomen: Bowel sounds are positive, abdomen soft and non-tender  Msk:  Back normal.  Normal strength and tone for age. Extremities: No clubbing, cyanosis or edema.   Neuro: Alert and oriented X 3. Psych:  Good affect, responds appropriately   LABS: Basic Metabolic Panel: Recent Labs    04/16/20 1737 04/17/20 0613  NA 137 138  K 4.3 3.8  CL 105 105  CO2 24 24  GLUCOSE 112* 126*  BUN 12 9  CREATININE 0.60 0.54  CALCIUM 9.7 9.1   Liver Function Tests: Recent Labs    04/15/20 1540  AST 32  ALT 29  ALKPHOS 42*  BILITOT 0.3  PROT 7.4  ALBUMIN 3.6   No results for input(s): LIPASE, AMYLASE in the last 72 hours. CBC: Recent Labs    04/15/20 1540 04/16/20 1737 04/17/20 0613  WBC 3.6 4.5 3.6*  NEUTROABS 2.1  --   --   HGB 11.4 11.6* 11.4*  HCT 36.8 36.7 35.8*  MCV 91 91.8 90.9  PLT 166 179 167   Cardiac Enzymes: No results for input(s): CKTOTAL, CKMB, CKMBINDEX,  TROPONINI in the last 72 hours. BNP: Invalid input(s): POCBNP D-Dimer: No results for input(s): DDIMER in the last 72 hours. Hemoglobin A1C: Recent Labs    04/15/20 1540  HGBA1C 6.6*   Fasting Lipid Panel: Recent Labs    04/17/20 0613  CHOL 145  HDL 47  LDLCALC 80  TRIG 89  CHOLHDL 3.1   Thyroid Function Tests: Recent Labs    04/15/20 1540  TSH 1.860   Anemia Panel: Recent Labs    04/15/20 1540  VITAMINB12 1,104    DG Chest 2 View  Result Date: 04/16/2020 CLINICAL DATA:  Chest pain. EXAM: CHEST - 2 VIEW COMPARISON:  June 24, 2010 FINDINGS: The lungs are hyperinflated. A single lead  ventricular pacer is noted. Mild, diffuse chronic appearing increased interstitial lung markings are seen. There is no evidence of acute infiltrate, pleural effusion or pneumothorax. The heart size and mediastinal contours are within normal limits. There is mild calcification of the aortic arch. The visualized skeletal structures are unremarkable. IMPRESSION: Chronic appearing increased interstitial lung markings without evidence of acute or active cardiopulmonary disease. Electronically Signed   By: Virgina Norfolk M.D.   On: 04/16/2020 17:57     TELEMETRY: Atrial fibrillation with varying ventricular response; rate in the upper 90s, low 100s   ASSESSMENT AND PLAN:  Principal Problem:   NSTEMI (non-ST elevated myocardial infarction) (HCC) Active Problems:   CAD (coronary artery disease)   Atrial fibrillation, chronic   Diabetes mellitus with neuropathy (HCC)   Hyperlipidemia   Malignant neoplasm of left female breast ([place])   1.  Chest pain, elevated troponin   -Mutual decision was made to treat medically given patient's age, resolution in symptoms, and recent neurological event   -Continue aspirin 81mg  daily, Plavix 75mg  daily, atorvastatin 40mg  daily, Imdur 30mg  daily, metoprolol 25mg  BID   -No further workup indicated from a cardiology standpoint; would consider discharge with close follow up in an outpatient setting within 7 days   2.  Atrial fibrillation   -Rate fairly well controlled; patient asymptomatic   -Previous history of bleeding while on Eliquis   -Continue metoprolol for rate control    The history, physical exam findings, and plan of care were all discussed with Dr. Bartholome Bill, and all decision making was made in collaboration.   Avie Arenas  PA-C 04/18/2020 7:41 AM

## 2020-04-18 NOTE — Telephone Encounter (Signed)
She was released this morning. Needs neurology referral. They are ok to go to [place]. Daughter in law would prefer to see Dr Manuella Ghazi but if the [place] office can see her sooner- ok to send there.

## 2020-04-18 NOTE — Progress Notes (Signed)
[facility] Discharge   Aspirin prescribed at discharge:  Yes  High Intensity Statin Prescribed? (Lipitor 40-80mg  or Crestor 20-40mg ): Yes  Beta Blocker Prescribed: Yes  ADP Receptor Inhibitor Prescribed? (i.e. Plavix etc.-Includes Medically Managed Patients): Yes  ACEI/ARB Prescribed? (If NO document contraindications)  Yes  Aldosterone Inhibitor Prescribed? No: Not indicated  Was EF assessed during THIS hospitalization? No: Will obtain echocardiogram in an outpatient setting   (YES = Measured in current episode of care or document plan to evaluate after discharge.)  Was EF < 40% ? No: Not assessed  Was Cardiac Rehab II ordered? (Includes Medically managed Patients): Yes  Was Smoking Cessation Advice provided?  No: Not indicated, non-smoker

## 2020-04-19 ENCOUNTER — Telehealth: Payer: Self-pay

## 2020-04-19 ENCOUNTER — Telehealth (HOSPITAL_COMMUNITY): Payer: Self-pay | Admitting: *Deleted

## 2020-04-19 NOTE — Telephone Encounter (Signed)
Received referral for this pt to participate in Cardiac Rehab Maintenance at [facility]  s/p NSTEMI.  Referral believed to be in error. Pt resides in [place].  Called and spoke to both pt and emergency contact [name] ( per pt request).  Pt will receive home health services including PT.  Pt and [name] feel she is not strong enough for exercise but is very interested once she gets a bit stronger.  Pt desires to participate at [facility] which is closer to her home ( she does not drive [place]  [name] is very familiar with cardiac rehab at [facility] - several friends who did the program.  Advised pt and [name] when she sees the cardiologist in follow up and the pt feels she is ready.  Have the following cardiologist MD place referral for Cardiac rehab at [facility].  Verbalized understanding and in agreement. Cherre Huger, BSN Cardiac and Pulmonary Rehab Nurse Navigator  [place]

## 2020-04-19 NOTE — Telephone Encounter (Signed)
Transition Care Management Follow-up Telephone Call  Date of discharge and from where: 04/18/20 from [facility]  How have you been since you were released from the hospital? Resting. Increasing activity slowly. Denies chest pain, shortness of breath, dizziness, nausea, vomiting, burred vision, fever. HHPT has contacted patient and plans to start when patient reaches back out to them next week.    Any questions or concerns? No  Items Reviewed:  Did the pt receive and understand the discharge instructions provided? Yes   Medications obtained and verified? Yes, daughter in law assists and currently managing   Any new allergies since your discharge? No   Dietary orders reviewed? Yes, low sodium   Do you have support at home? Yes , husband and daughter in law  Functional Questionnaire: (I = Independent and D = Dependent) ADLs: i  Bathing/Dressing- i  Meal Prep- husband/daughter in law assist  Eating- i  Maintaining continence- i  Transferring/Ambulation- walker  Managing Meds- i  Follow up appointments reviewed:   PCP Hospital f/u appt confirmed? Yes  Scheduled to see Dr. [name] on 04/24/20 @ 12:30.  [facility] f/u appt confirmed? Yes  Scheduled to see Cardiology on 04/25/20 @ 4:00.  Are transportation arrangements needed? No   If their condition worsens, is the pt aware to call PCP or go to the Emergency Dept.? Yes  Was the patient provided with contact information for the PCP's office or ED? Yes  Was to pt encouraged to call back with questions or concerns? Yes

## 2020-04-20 DIAGNOSIS — H3530 Unspecified macular degeneration: Secondary | ICD-10-CM | POA: Diagnosis not present

## 2020-04-20 DIAGNOSIS — R011 Cardiac murmur, unspecified: Secondary | ICD-10-CM | POA: Diagnosis not present

## 2020-04-20 DIAGNOSIS — Z853 Personal history of malignant neoplasm of breast: Secondary | ICD-10-CM | POA: Diagnosis not present

## 2020-04-20 DIAGNOSIS — N390 Urinary tract infection, site not specified: Secondary | ICD-10-CM | POA: Diagnosis not present

## 2020-04-20 DIAGNOSIS — Z8673 Personal history of transient ischemic attack (TIA), and cerebral infarction without residual deficits: Secondary | ICD-10-CM | POA: Diagnosis not present

## 2020-04-20 DIAGNOSIS — Z85828 Personal history of other malignant neoplasm of skin: Secondary | ICD-10-CM | POA: Diagnosis not present

## 2020-04-20 DIAGNOSIS — Z95810 Presence of automatic (implantable) cardiac defibrillator: Secondary | ICD-10-CM | POA: Diagnosis not present

## 2020-04-20 DIAGNOSIS — Z950 Presence of cardiac pacemaker: Secondary | ICD-10-CM | POA: Diagnosis not present

## 2020-04-20 DIAGNOSIS — E785 Hyperlipidemia, unspecified: Secondary | ICD-10-CM | POA: Diagnosis not present

## 2020-04-20 DIAGNOSIS — Z7982 Long term (current) use of aspirin: Secondary | ICD-10-CM | POA: Diagnosis not present

## 2020-04-20 DIAGNOSIS — Z87442 Personal history of urinary calculi: Secondary | ICD-10-CM | POA: Diagnosis not present

## 2020-04-20 DIAGNOSIS — Z87891 Personal history of nicotine dependence: Secondary | ICD-10-CM | POA: Diagnosis not present

## 2020-04-20 DIAGNOSIS — Z170 Estrogen receptor positive status [ER+]: Secondary | ICD-10-CM | POA: Diagnosis not present

## 2020-04-20 DIAGNOSIS — Z7984 Long term (current) use of oral hypoglycemic drugs: Secondary | ICD-10-CM | POA: Diagnosis not present

## 2020-04-20 DIAGNOSIS — I459 Conduction disorder, unspecified: Secondary | ICD-10-CM | POA: Diagnosis not present

## 2020-04-20 DIAGNOSIS — I4811 Longstanding persistent atrial fibrillation: Secondary | ICD-10-CM | POA: Diagnosis not present

## 2020-04-20 DIAGNOSIS — E1140 Type 2 diabetes mellitus with diabetic neuropathy, unspecified: Secondary | ICD-10-CM | POA: Diagnosis not present

## 2020-04-20 DIAGNOSIS — Z9089 Acquired absence of other organs: Secondary | ICD-10-CM | POA: Diagnosis not present

## 2020-04-20 DIAGNOSIS — M1990 Unspecified osteoarthritis, unspecified site: Secondary | ICD-10-CM | POA: Diagnosis not present

## 2020-04-20 DIAGNOSIS — D649 Anemia, unspecified: Secondary | ICD-10-CM | POA: Diagnosis not present

## 2020-04-20 DIAGNOSIS — Z90710 Acquired absence of both cervix and uterus: Secondary | ICD-10-CM | POA: Diagnosis not present

## 2020-04-20 DIAGNOSIS — Z9181 History of falling: Secondary | ICD-10-CM | POA: Diagnosis not present

## 2020-04-20 DIAGNOSIS — Z7902 Long term (current) use of antithrombotics/antiplatelets: Secondary | ICD-10-CM | POA: Diagnosis not present

## 2020-04-20 DIAGNOSIS — I2510 Atherosclerotic heart disease of native coronary artery without angina pectoris: Secondary | ICD-10-CM | POA: Diagnosis not present

## 2020-04-20 DIAGNOSIS — I214 Non-ST elevation (NSTEMI) myocardial infarction: Secondary | ICD-10-CM | POA: Diagnosis not present

## 2020-04-22 ENCOUNTER — Telehealth: Payer: Self-pay

## 2020-04-22 NOTE — Telephone Encounter (Signed)
Ok

## 2020-04-22 NOTE — Telephone Encounter (Signed)
Verbal orders given for home health PT.

## 2020-04-23 ENCOUNTER — Inpatient Hospital Stay: Payer: Medicare Other | Admitting: Internal Medicine

## 2020-04-24 ENCOUNTER — Inpatient Hospital Stay: Payer: Medicare Other | Admitting: Internal Medicine

## 2020-04-24 ENCOUNTER — Telehealth: Payer: Self-pay | Admitting: Internal Medicine

## 2020-04-24 NOTE — Telephone Encounter (Signed)
Homehealth called wanted  Confirmation from MD that pt has a UTI they need for her file contact person is Adria Devon # [phone] phone # [phone] opt2

## 2020-04-25 DIAGNOSIS — I214 Non-ST elevation (NSTEMI) myocardial infarction: Secondary | ICD-10-CM | POA: Diagnosis not present

## 2020-04-25 DIAGNOSIS — I4811 Longstanding persistent atrial fibrillation: Secondary | ICD-10-CM | POA: Diagnosis not present

## 2020-04-25 DIAGNOSIS — I495 Sick sinus syndrome: Secondary | ICD-10-CM | POA: Diagnosis not present

## 2020-04-25 NOTE — Telephone Encounter (Signed)
I faxed the urine culture results and message that the patient had a UTI to [facility] health.  Vanita Cannell,cma

## 2020-04-26 DIAGNOSIS — I214 Non-ST elevation (NSTEMI) myocardial infarction: Secondary | ICD-10-CM | POA: Diagnosis not present

## 2020-04-26 DIAGNOSIS — E1140 Type 2 diabetes mellitus with diabetic neuropathy, unspecified: Secondary | ICD-10-CM | POA: Diagnosis not present

## 2020-04-26 DIAGNOSIS — R011 Cardiac murmur, unspecified: Secondary | ICD-10-CM | POA: Diagnosis not present

## 2020-04-26 DIAGNOSIS — I459 Conduction disorder, unspecified: Secondary | ICD-10-CM | POA: Diagnosis not present

## 2020-04-26 DIAGNOSIS — I4811 Longstanding persistent atrial fibrillation: Secondary | ICD-10-CM | POA: Diagnosis not present

## 2020-04-26 DIAGNOSIS — I2510 Atherosclerotic heart disease of native coronary artery without angina pectoris: Secondary | ICD-10-CM | POA: Diagnosis not present

## 2020-04-29 ENCOUNTER — Encounter: Payer: Self-pay | Admitting: Internal Medicine

## 2020-04-29 DIAGNOSIS — H353221 Exudative age-related macular degeneration, left eye, with active choroidal neovascularization: Secondary | ICD-10-CM | POA: Diagnosis not present

## 2020-04-30 DIAGNOSIS — I214 Non-ST elevation (NSTEMI) myocardial infarction: Secondary | ICD-10-CM | POA: Diagnosis not present

## 2020-04-30 DIAGNOSIS — I459 Conduction disorder, unspecified: Secondary | ICD-10-CM | POA: Diagnosis not present

## 2020-04-30 DIAGNOSIS — I2510 Atherosclerotic heart disease of native coronary artery without angina pectoris: Secondary | ICD-10-CM | POA: Diagnosis not present

## 2020-04-30 DIAGNOSIS — E1140 Type 2 diabetes mellitus with diabetic neuropathy, unspecified: Secondary | ICD-10-CM | POA: Diagnosis not present

## 2020-04-30 DIAGNOSIS — I4811 Longstanding persistent atrial fibrillation: Secondary | ICD-10-CM | POA: Diagnosis not present

## 2020-04-30 DIAGNOSIS — R011 Cardiac murmur, unspecified: Secondary | ICD-10-CM | POA: Diagnosis not present

## 2020-04-30 NOTE — Telephone Encounter (Signed)
See me before calling.  Please call [name].  She has an appt with neurology on Thursday.  Does she feel she needs something before that appt.  See if anything acute.

## 2020-05-01 ENCOUNTER — Ambulatory Visit (INDEPENDENT_AMBULATORY_CARE_PROVIDER_SITE_OTHER): Payer: Medicare Other | Admitting: Internal Medicine

## 2020-05-01 ENCOUNTER — Other Ambulatory Visit: Payer: Self-pay

## 2020-05-01 ENCOUNTER — Encounter: Payer: Self-pay | Admitting: Internal Medicine

## 2020-05-01 DIAGNOSIS — R4182 Altered mental status, unspecified: Secondary | ICD-10-CM

## 2020-05-01 DIAGNOSIS — I2510 Atherosclerotic heart disease of native coronary artery without angina pectoris: Secondary | ICD-10-CM

## 2020-05-01 DIAGNOSIS — I480 Paroxysmal atrial fibrillation: Secondary | ICD-10-CM | POA: Diagnosis not present

## 2020-05-01 DIAGNOSIS — C50912 Malignant neoplasm of unspecified site of left female breast: Secondary | ICD-10-CM

## 2020-05-01 DIAGNOSIS — N3000 Acute cystitis without hematuria: Secondary | ICD-10-CM

## 2020-05-01 DIAGNOSIS — E785 Hyperlipidemia, unspecified: Secondary | ICD-10-CM | POA: Diagnosis not present

## 2020-05-01 DIAGNOSIS — D509 Iron deficiency anemia, unspecified: Secondary | ICD-10-CM

## 2020-05-01 DIAGNOSIS — E1165 Type 2 diabetes mellitus with hyperglycemia: Secondary | ICD-10-CM

## 2020-05-01 MED ORDER — METFORMIN HCL ER 500 MG PO TB24: 500.0000 mg | ORAL_TABLET | Freq: Two times a day (BID) | ORAL | 1 refills | Status: DC

## 2020-05-01 NOTE — Telephone Encounter (Signed)
Spoke with daughter in law prior to appt. Daughter in law confirmed that this message was to update Dr [name] for appt today. Patient has appt with Dr Melrose Nakayama tomorrow. She is forgetful and losing things in the house. BP has been ok, no dizzy spells. Daughter in law confirmed nothing more acute going on than what has been happening over the last few weeks.

## 2020-05-01 NOTE — Telephone Encounter (Signed)
LMTCB pt has appt with PCP today

## 2020-05-01 NOTE — Progress Notes (Signed)
Patient ID: Holly Rose, female   DOB: 1930-08-04, 86 y.o.   MRN: [id number]   Subjective:    Patient ID: Holly Rose, female    DOB: 1930/03/24, 86 y.o.   MRN: [id number]  HPI This visit occurred during the SARS-CoV-2 public health emergency.  Safety protocols were in place, including screening questions prior to the visit, additional usage of staff PPE, and extensive cleaning of exam room while observing appropriate contact time as indicated for disinfecting solutions.  Patient here for hospital follow up.  She is accompanied by her daughter-n-law.  History obtained from both of them.  She was admitted 04/17/20 - 04/18/20 with NSTEMI - with chest pain and elevated troponin.  Evaluated by cardiology.  Elected Conservation officer, nature.  Per note, discharged on aspirin, plavix, imdur and metoprolol.  Also on lipitor.  Referral made for cardiac rehab.  On aspirin and plavix, along with metoprolol for chronic afib.  She denies any significant chest pain, but does report occasional chest tightness.  Saw Dr Ubaldo Glassing 04/25/20.  Discussed symptoms.  Recommended to continue imdur and metoprolol and follow pressures.  Sugars low. She has lost weight.  Is eating better.  No vomiting.  No abdominal pain or bowel change reported.    Past Medical History:  Diagnosis Date  . Allergy   . Anemia   . Arthritis   . Atrial fibrillation ([place]) 2004  . Breast cancer, left ([place]) 10/2017   Lumpectomy and rad tx's.   Marland Kitchen CAD (coronary artery disease)   . Complication of anesthesia    hard to wake up with general anesthesia  . Diabetes ([place])   . Dysrhythmia    A-fib  . Heart attack ([place]) 2008  . Heart murmur   . History of kidney stones   . History of sick sinus syndrome    s/p pacemaker placement  . Hyperlipidemia   . Macular degeneration   . Neuropathy   . Neuropathy   . Pacemaker 06/2010  . Personal history of radiation therapy 11/2017   LEFT lumpectomy   . Squamous cell skin cancer   . TIA (transient ischemic  attack)    Past Surgical History:  Procedure Laterality Date  . ABDOMINAL HYSTERECTOMY  1960's  . APPENDECTOMY  1947  . [place]  . BREAST BIOPSY Left 12/17/2016   SINGLE FRAGMENT OF ATYPICAL EPITHELIAL CELLS  . BREAST BIOPSY Left 04/08/2017   [facility]  . BREAST EXCISIONAL BIOPSY Left 01/18/1986   Benign microcalcifications, [employer].  Marland Kitchen BREAST LUMPECTOMY Left 05/26/2017   13 mm,T1c, N0; ER/ PR+, Her 2 neu negative, HIGH RISK by [place] ;  Surgeon: [name], MD;  Location: [facility] ORS;  Service: General;  Laterality: Left;  . BREAST SURGERY  1988  . CARDIAC CATHETERIZATION  1989  . CATARACT EXTRACTION  1997 and 1998  . COLONOSCOPY WITH PROPOFOL N/A 09/12/2015   Procedure: COLONOSCOPY WITH PROPOFOL;  Surgeon: [name], MD;  Location: [facility] ENDOSCOPY;  Service: Endoscopy;  Laterality: N/A;  . IMPLANTABLE CARDIOVERTER DEFIBRILLATOR (ICD) GENERATOR CHANGE Left 03/29/2019   Procedure: PACEMAKER CHANGE OUT;  Surgeon: [name], MD;  Location: [facility] ORS;  Service: Cardiovascular;  Laterality: Left;  . KIDNEY STONE SURGERY  2018  . MOHS SURGERY  2019   forehead  . pace maker  2008  . [place]  . TONSILLECTOMY AND ADENOIDECTOMY  1950"s   Family History  Problem Relation Age of Onset  .  Stroke Mother   . Diabetes Mother   . Cancer Maternal Aunt        Breast Cancer  . Breast cancer Maternal Aunt   . Arthritis Maternal Grandmother   . Cancer Maternal Aunt        Lung Cancer  . Diabetes Son   . Cancer Son   . Kidney disease Neg Hx   . Bladder Cancer Neg Hx    Social History   Socioeconomic History  . Marital status: Married    Spouse name: Holly Rose  . Number of children: 2  . Years of education: Not on file  . Highest education level: Not on file  Occupational History  . Not on file  Tobacco Use  . Smoking status: Former Smoker    Years: 5.00    Quit date: 09/14/1968    Years since quitting: 51.6  . Smokeless  tobacco: Never Used  . Tobacco comment: quit 1970  Vaping Use  . Vaping Use: Never used  Substance and Sexual Activity  . Alcohol use: No    Alcohol/week: 0.0 standard drinks  . Drug use: No  . Sexual activity: Not on file  Other Topics Concern  . Not on file  Social History Narrative   Live home with husband in [place]   Social Determinants of Health   Financial Resource Strain:   . Difficulty of Paying Living Expenses: Not on file  Food Insecurity:   . Worried About Charity fundraiser in the Last Year: Not on file  . Ran Out of Food in the Last Year: Not on file  Transportation Needs:   . Lack of Transportation (Medical): Not on file  . Lack of Transportation (Non-Medical): Not on file  Physical Activity:   . Days of Exercise per Week: Not on file  . Minutes of Exercise per Session: Not on file  Stress:   . Feeling of Stress : Not on file  Social Connections:   . Frequency of Communication with Friends and Family: Not on file  . Frequency of Social Gatherings with Friends and Family: Not on file  . Attends Religious Services: Not on file  . Active Member of Clubs or Organizations: Not on file  . Attends Archivist Meetings: Not on file  . Marital Status: Not on file    Outpatient Encounter Medications as of 05/01/2020  Medication Sig  . Aflibercept (EYLEA) 2 MG/0.05ML SOLN 1 each by Intravitreal route as directed.   Marland Kitchen alendronate (FOSAMAX) 70 MG tablet Take 1 tablet (70 mg total) by mouth once a week. Take with a full glass of water on an empty stomach.  Marland Kitchen aspirin 81 MG tablet Take 81 mg by mouth daily.  Marland Kitchen atorvastatin (LIPITOR) 40 MG tablet Take 1 tablet (40 mg total) by mouth daily.  Marland Kitchen BAYER ASPIRIN EC LOW DOSE 81 MG EC tablet Take by mouth.  . Biotin w/ Vitamins C & E (HAIR/SKIN/NAILS PO) Take 3 tablets by mouth daily. 2 in the am, 1 in the pm  . blood glucose meter kit and supplies Per patient please dispense One touch Ultra2 meter.Use meter  and supplies three times a day to check blood sugar. ICD10: E11.40  . Blood Glucose Monitoring Suppl (FREESTYLE LITE) DEVI 2 (two) times daily.  . Cholecalciferol (VITAMIN D) 50 MCG (2000 UT) CAPS Take 4,000 Units by mouth daily.  . clopidogrel (PLAVIX) 75 MG tablet Take 1 tablet (75 mg total) by mouth daily.  . furosemide (  LASIX) 20 MG tablet Take 1 tablet (20 mg total) by mouth 2 (two) times daily.  Marland Kitchen glucose blood test strip Use as instructed to check sugars twice daily. Dx E11.9  . isosorbide mononitrate (IMDUR) 30 MG 24 hr tablet Take 1 tablet (30 mg total) by mouth daily.  . Lancets (FREESTYLE) lancets 2 (two) times daily.  Marland Kitchen lisinopril (ZESTRIL) 5 MG tablet Take 1 tablet (5 mg total) by mouth daily.  . Magnesium 200 MG TABS Take 200 mg by mouth 2 (two) times a day.  . meclizine (ANTIVERT) 12.5 MG tablet Take 1 tablet (12.5 mg total) by mouth 2 (two) times daily as needed for dizziness.  . metFORMIN (GLUCOPHAGE-XR) 500 MG 24 hr tablet Take 1 tablet (500 mg total) by mouth 2 (two) times daily.  . metoprolol (LOPRESSOR) 50 MG tablet Take 25 mg by mouth 2 (two) times daily.   . Multiple Vitamins-Minerals (PRESERVISION AREDS 2 PO) Take 1 tablet by mouth 2 (two) times daily.   . nitroGLYCERIN (NITROSTAT) 0.4 MG SL tablet SMARTSIG:1 Tablet(s) Sublingual PRN  . Polyethyl Glycol-Propyl Glycol (SYSTANE OP) Apply 1 drop to eye 2 (two) times daily.  . Probiotic Product (PROBIOTIC DAILY PO) Take by mouth.  . tamoxifen (NOLVADEX) 20 MG tablet Take 1 tablet by mouth once daily  . vitamin B-12 (CYANOCOBALAMIN) 500 MCG tablet Take 500 mcg by mouth 2 (two) times a day.  . [DISCONTINUED] metFORMIN (GLUCOPHAGE-XR) 500 MG 24 hr tablet Take 2 tablets by mouth twice daily (Patient taking differently: Take 500 mg by mouth 2 (two) times daily. )  . [DISCONTINUED] cephALEXin (KEFLEX) 500 MG capsule Take 1 capsule (500 mg total) by mouth 2 (two) times daily.   No facility-administered encounter medications on file  as of 05/01/2020.    Review of Systems  Constitutional:       Appetite is better.  Weight loss.   HENT: Negative for congestion and sinus pressure.   Respiratory: Negative for cough and shortness of breath.        Some intermittent chest tightness as outlined.   Cardiovascular: Negative for palpitations and leg swelling.  Gastrointestinal: Negative for abdominal pain, diarrhea, nausea and vomiting.  Genitourinary: Negative for difficulty urinating and dysuria.  Musculoskeletal: Negative for joint swelling and myalgias.  Skin: Negative for color change and rash.  Neurological: Negative for light-headedness and headaches.       Family concerned regarding intermittent confusion and forgetfulness.    Psychiatric/Behavioral: Negative for agitation and dysphoric mood.       Objective:    Physical Exam Vitals reviewed.  Constitutional:      General: She is not in acute distress.    Appearance: Normal appearance.  HENT:     Head: Normocephalic and atraumatic.     Right Ear: External ear normal.     Left Ear: External ear normal.  Eyes:     General: No scleral icterus.       Right eye: No discharge.        Left eye: No discharge.     Conjunctiva/sclera: Conjunctivae normal.  Neck:     Thyroid: No thyromegaly.  Cardiovascular:     Rate and Rhythm: Normal rate and regular rhythm.  Pulmonary:     Effort: No respiratory distress.     Breath sounds: Normal breath sounds. No wheezing.  Abdominal:     General: Bowel sounds are normal.     Palpations: Abdomen is soft.     Tenderness: There is no abdominal  tenderness.  Musculoskeletal:        General: No swelling or tenderness.     Cervical back: Neck supple. No tenderness.  Lymphadenopathy:     Cervical: No cervical adenopathy.  Skin:    Findings: No erythema or rash.  Neurological:     Mental Status: She is alert.  Psychiatric:        Mood and Affect: Mood normal.        Behavior: Behavior normal.     BP 118/62 (BP  Location: Left Arm, Patient Position: Sitting, Cuff Size: Normal)   Pulse 85   Temp 97.7 F (36.5 C) (Oral)   Resp 16   Ht $R'5\' 2"'Yy$  (1.575 m)   Wt 140 lb 12.8 oz (63.9 kg)   LMP  (LMP Unknown)   SpO2 93%   BMI 25.75 kg/m  Wt Readings from Last 3 Encounters:  05/01/20 140 lb 12.8 oz (63.9 kg)  04/16/20 153 lb (69.4 kg)  04/15/20 153 lb (69.4 kg)     Lab Results  Component Value Date   WBC 3.6 (L) 04/17/2020   HGB 11.4 (L) 04/17/2020   HCT 35.8 (L) 04/17/2020   PLT 167 04/17/2020   GLUCOSE 126 (H) 04/17/2020   CHOL 145 04/17/2020   TRIG 89 04/17/2020   HDL 47 04/17/2020   LDLCALC 80 04/17/2020   ALT 29 04/15/2020   AST 32 04/15/2020   NA 138 04/17/2020   K 3.8 04/17/2020   CL 105 04/17/2020   CREATININE 0.54 04/17/2020   BUN 9 04/17/2020   CO2 24 04/17/2020   TSH 1.860 04/15/2020   INR 1.2 04/17/2020   HGBA1C 6.6 (H) 04/15/2020   MICROALBUR 65.4 (H) 03/09/2016       Assessment & Plan:   Problem List Items Addressed This Visit    UTI (urinary tract infection)    Treated for recent abx.  Urine checked to confirm that this was not contributing to her mental status change.        Type II diabetes mellitus ([place])    Has been seeing endocrinology.  Last a1c 6.6.  Outside sugars reviewed.  Running low.  Will decrease metformin to $RemoveBefo'500mg'ikBQGktonwf$  bid.  Follow sugars.  Send in readings.  If persistent decrease, will adjust dosing more.  Has lost weight.  Follow.       Relevant Medications   BAYER ASPIRIN EC LOW DOSE 81 MG EC tablet   metFORMIN (GLUCOPHAGE-XR) 500 MG 24 hr tablet   Paroxysmal A-fib ([facility])    Just had pacemaker change.  On metoprolol.  Stable.  Continue f/u with cardiology.       Relevant Medications   BAYER ASPIRIN EC LOW DOSE 81 MG EC tablet   nitroGLYCERIN (NITROSTAT) 0.4 MG SL tablet   Malignant neoplasm of left female breast ([facility])    On tamoxifen.  S/p XRT.  Stable.        Relevant Medications   BAYER ASPIRIN EC LOW DOSE 81 MG EC tablet   Iron  deficiency anemia    Follow cbc and iron studies.        Hyperlipidemia    On lipitor.  Follow lipid panel and liver function tests.        Relevant Medications   BAYER ASPIRIN EC LOW DOSE 81 MG EC tablet   nitroGLYCERIN (NITROSTAT) 0.4 MG SL tablet   Change in mental status    Appears to be answering questions appropriately today.  Recently treated for UTI.  Scheduled to see  Dr Melrose Nakayama (neurology) for formal evaluation.        CAD (coronary artery disease)    Recently admitted with NSTEMI.  Continue metoprolol and imdur.  Currently without pain.  Continue current medication regimen.  Continue risk factor modification.        Relevant Medications   BAYER ASPIRIN EC LOW DOSE 81 MG EC tablet   nitroGLYCERIN (NITROSTAT) 0.4 MG SL tablet       [name], MD

## 2020-05-01 NOTE — Patient Instructions (Signed)
Decrease metformin to 500mg  one tablet 2x/day

## 2020-05-02 DIAGNOSIS — R413 Other amnesia: Secondary | ICD-10-CM | POA: Diagnosis not present

## 2020-05-02 DIAGNOSIS — N390 Urinary tract infection, site not specified: Secondary | ICD-10-CM | POA: Diagnosis not present

## 2020-05-02 DIAGNOSIS — R410 Disorientation, unspecified: Secondary | ICD-10-CM | POA: Diagnosis not present

## 2020-05-02 DIAGNOSIS — B9620 Unspecified Escherichia coli [E. coli] as the cause of diseases classified elsewhere: Secondary | ICD-10-CM | POA: Diagnosis not present

## 2020-05-02 DIAGNOSIS — R296 Repeated falls: Secondary | ICD-10-CM | POA: Diagnosis not present

## 2020-05-03 ENCOUNTER — Encounter: Payer: Self-pay | Admitting: Internal Medicine

## 2020-05-03 DIAGNOSIS — I214 Non-ST elevation (NSTEMI) myocardial infarction: Secondary | ICD-10-CM | POA: Diagnosis not present

## 2020-05-03 DIAGNOSIS — R011 Cardiac murmur, unspecified: Secondary | ICD-10-CM | POA: Diagnosis not present

## 2020-05-03 DIAGNOSIS — I2510 Atherosclerotic heart disease of native coronary artery without angina pectoris: Secondary | ICD-10-CM | POA: Diagnosis not present

## 2020-05-03 DIAGNOSIS — I459 Conduction disorder, unspecified: Secondary | ICD-10-CM | POA: Diagnosis not present

## 2020-05-03 DIAGNOSIS — E1140 Type 2 diabetes mellitus with diabetic neuropathy, unspecified: Secondary | ICD-10-CM | POA: Diagnosis not present

## 2020-05-03 DIAGNOSIS — I4811 Longstanding persistent atrial fibrillation: Secondary | ICD-10-CM | POA: Diagnosis not present

## 2020-05-06 ENCOUNTER — Encounter: Payer: Self-pay | Admitting: Internal Medicine

## 2020-05-06 NOTE — Assessment & Plan Note (Signed)
Follow cbc and iron studies.  

## 2020-05-06 NOTE — Assessment & Plan Note (Signed)
Has been seeing endocrinology.  Last a1c 6.6.  Outside sugars reviewed.  Running low.  Will decrease metformin to 500mg  bid.  Follow sugars.  Send in readings.  If persistent decrease, will adjust dosing more.  Has lost weight.  Follow.

## 2020-05-06 NOTE — Assessment & Plan Note (Signed)
On tamoxifen.  S/p XRT.  Stable.

## 2020-05-06 NOTE — Assessment & Plan Note (Signed)
Just had pacemaker change.  On metoprolol.  Stable.  Continue f/u with cardiology.

## 2020-05-06 NOTE — Assessment & Plan Note (Signed)
Recently admitted with NSTEMI.  Continue metoprolol and imdur.  Currently without pain.  Continue current medication regimen.  Continue risk factor modification.

## 2020-05-06 NOTE — Assessment & Plan Note (Signed)
On lipitor.  Follow lipid panel and liver function tests.   

## 2020-05-06 NOTE — Assessment & Plan Note (Signed)
Appears to be answering questions appropriately today.  Recently treated for UTI.  Scheduled to see Dr Melrose Nakayama (neurology) for formal evaluation.

## 2020-05-06 NOTE — Assessment & Plan Note (Signed)
Treated for recent abx.  Urine checked to confirm that this was not contributing to her mental status change.

## 2020-05-07 DIAGNOSIS — I495 Sick sinus syndrome: Secondary | ICD-10-CM | POA: Diagnosis not present

## 2020-05-07 DIAGNOSIS — I2510 Atherosclerotic heart disease of native coronary artery without angina pectoris: Secondary | ICD-10-CM | POA: Diagnosis not present

## 2020-05-07 DIAGNOSIS — E1140 Type 2 diabetes mellitus with diabetic neuropathy, unspecified: Secondary | ICD-10-CM | POA: Diagnosis not present

## 2020-05-07 DIAGNOSIS — I459 Conduction disorder, unspecified: Secondary | ICD-10-CM | POA: Diagnosis not present

## 2020-05-07 DIAGNOSIS — R011 Cardiac murmur, unspecified: Secondary | ICD-10-CM | POA: Diagnosis not present

## 2020-05-07 DIAGNOSIS — I4811 Longstanding persistent atrial fibrillation: Secondary | ICD-10-CM | POA: Diagnosis not present

## 2020-05-07 DIAGNOSIS — I214 Non-ST elevation (NSTEMI) myocardial infarction: Secondary | ICD-10-CM | POA: Diagnosis not present

## 2020-05-08 ENCOUNTER — Telehealth: Payer: Self-pay | Admitting: Internal Medicine

## 2020-05-08 ENCOUNTER — Encounter: Payer: Self-pay | Admitting: Internal Medicine

## 2020-05-08 NOTE — Telephone Encounter (Signed)
LMTCB

## 2020-05-08 NOTE — Telephone Encounter (Signed)
Holly Rose is coming in to get cup and and collect sample in the morning. Pt is having burning with urination

## 2020-05-08 NOTE — Telephone Encounter (Signed)
Patient' daughter-in-law called back again about patient UTI

## 2020-05-08 NOTE — Telephone Encounter (Signed)
Holly Rose called returning your call

## 2020-05-08 NOTE — Telephone Encounter (Signed)
Patient's daughter inlaw called and said that patient still has UTI symptoms. She would like to have urine checked.

## 2020-05-09 ENCOUNTER — Other Ambulatory Visit (INDEPENDENT_AMBULATORY_CARE_PROVIDER_SITE_OTHER): Payer: Medicare Other

## 2020-05-09 ENCOUNTER — Other Ambulatory Visit: Payer: Self-pay

## 2020-05-09 ENCOUNTER — Telehealth: Payer: Self-pay | Admitting: *Deleted

## 2020-05-09 DIAGNOSIS — R300 Dysuria: Secondary | ICD-10-CM

## 2020-05-09 LAB — URINALYSIS, ROUTINE W REFLEX MICROSCOPIC
Bilirubin Urine: NEGATIVE
Nitrite: POSITIVE — AB
Specific Gravity, Urine: 1.015 (ref 1.000–1.030)
Total Protein, Urine: 30 — AB
Urine Glucose: NEGATIVE
Urobilinogen, UA: 1 (ref 0.0–1.0)
pH: 6 (ref 5.0–8.0)

## 2020-05-09 MED ORDER — AMOXICILLIN-POT CLAVULANATE 500-125 MG PO TABS
1.0000 | ORAL_TABLET | Freq: Two times a day (BID) | ORAL | 0 refills | Status: DC
Start: 2020-05-09 — End: 2020-05-28

## 2020-05-09 NOTE — Telephone Encounter (Signed)
Pt dropped off urine specimen this morning. No orders found. Please place future ore

## 2020-05-09 NOTE — Telephone Encounter (Signed)
Future orders placed 

## 2020-05-10 DIAGNOSIS — I214 Non-ST elevation (NSTEMI) myocardial infarction: Secondary | ICD-10-CM | POA: Diagnosis not present

## 2020-05-10 DIAGNOSIS — E1140 Type 2 diabetes mellitus with diabetic neuropathy, unspecified: Secondary | ICD-10-CM | POA: Diagnosis not present

## 2020-05-10 DIAGNOSIS — I2510 Atherosclerotic heart disease of native coronary artery without angina pectoris: Secondary | ICD-10-CM | POA: Diagnosis not present

## 2020-05-10 DIAGNOSIS — I459 Conduction disorder, unspecified: Secondary | ICD-10-CM | POA: Diagnosis not present

## 2020-05-10 DIAGNOSIS — I4811 Longstanding persistent atrial fibrillation: Secondary | ICD-10-CM | POA: Diagnosis not present

## 2020-05-10 DIAGNOSIS — R011 Cardiac murmur, unspecified: Secondary | ICD-10-CM | POA: Diagnosis not present

## 2020-05-10 LAB — URINE CULTURE
MICRO NUMBER:: 10876152
SPECIMEN QUALITY:: ADEQUATE

## 2020-05-11 ENCOUNTER — Other Ambulatory Visit: Payer: Self-pay | Admitting: Internal Medicine

## 2020-05-11 DIAGNOSIS — R319 Hematuria, unspecified: Secondary | ICD-10-CM

## 2020-05-11 NOTE — Progress Notes (Signed)
Order placed for f/u urinalysis 

## 2020-05-13 DIAGNOSIS — H353211 Exudative age-related macular degeneration, right eye, with active choroidal neovascularization: Secondary | ICD-10-CM | POA: Diagnosis not present

## 2020-05-14 ENCOUNTER — Encounter: Payer: Self-pay | Admitting: Internal Medicine

## 2020-05-14 DIAGNOSIS — E1140 Type 2 diabetes mellitus with diabetic neuropathy, unspecified: Secondary | ICD-10-CM | POA: Diagnosis not present

## 2020-05-14 DIAGNOSIS — I459 Conduction disorder, unspecified: Secondary | ICD-10-CM | POA: Diagnosis not present

## 2020-05-14 DIAGNOSIS — I214 Non-ST elevation (NSTEMI) myocardial infarction: Secondary | ICD-10-CM | POA: Diagnosis not present

## 2020-05-14 DIAGNOSIS — I2510 Atherosclerotic heart disease of native coronary artery without angina pectoris: Secondary | ICD-10-CM | POA: Diagnosis not present

## 2020-05-14 DIAGNOSIS — R011 Cardiac murmur, unspecified: Secondary | ICD-10-CM | POA: Diagnosis not present

## 2020-05-14 DIAGNOSIS — I4811 Longstanding persistent atrial fibrillation: Secondary | ICD-10-CM | POA: Diagnosis not present

## 2020-05-14 NOTE — Telephone Encounter (Signed)
If she has been taking regularly, then ok to refill imdur x 3.

## 2020-05-15 ENCOUNTER — Encounter: Payer: Self-pay | Admitting: Internal Medicine

## 2020-05-15 NOTE — Telephone Encounter (Signed)
See other message

## 2020-05-16 DIAGNOSIS — I4811 Longstanding persistent atrial fibrillation: Secondary | ICD-10-CM | POA: Diagnosis not present

## 2020-05-16 DIAGNOSIS — I2510 Atherosclerotic heart disease of native coronary artery without angina pectoris: Secondary | ICD-10-CM | POA: Diagnosis not present

## 2020-05-16 DIAGNOSIS — I214 Non-ST elevation (NSTEMI) myocardial infarction: Secondary | ICD-10-CM | POA: Diagnosis not present

## 2020-05-16 DIAGNOSIS — I459 Conduction disorder, unspecified: Secondary | ICD-10-CM | POA: Diagnosis not present

## 2020-05-16 DIAGNOSIS — R011 Cardiac murmur, unspecified: Secondary | ICD-10-CM | POA: Diagnosis not present

## 2020-05-16 DIAGNOSIS — E1140 Type 2 diabetes mellitus with diabetic neuropathy, unspecified: Secondary | ICD-10-CM | POA: Diagnosis not present

## 2020-05-20 DIAGNOSIS — Z9089 Acquired absence of other organs: Secondary | ICD-10-CM | POA: Diagnosis not present

## 2020-05-20 DIAGNOSIS — E1140 Type 2 diabetes mellitus with diabetic neuropathy, unspecified: Secondary | ICD-10-CM | POA: Diagnosis not present

## 2020-05-20 DIAGNOSIS — Z95810 Presence of automatic (implantable) cardiac defibrillator: Secondary | ICD-10-CM | POA: Diagnosis not present

## 2020-05-20 DIAGNOSIS — Z950 Presence of cardiac pacemaker: Secondary | ICD-10-CM | POA: Diagnosis not present

## 2020-05-20 DIAGNOSIS — Z8673 Personal history of transient ischemic attack (TIA), and cerebral infarction without residual deficits: Secondary | ICD-10-CM | POA: Diagnosis not present

## 2020-05-20 DIAGNOSIS — D649 Anemia, unspecified: Secondary | ICD-10-CM | POA: Diagnosis not present

## 2020-05-20 DIAGNOSIS — Z87442 Personal history of urinary calculi: Secondary | ICD-10-CM | POA: Diagnosis not present

## 2020-05-20 DIAGNOSIS — H3530 Unspecified macular degeneration: Secondary | ICD-10-CM | POA: Diagnosis not present

## 2020-05-20 DIAGNOSIS — Z7982 Long term (current) use of aspirin: Secondary | ICD-10-CM | POA: Diagnosis not present

## 2020-05-20 DIAGNOSIS — I214 Non-ST elevation (NSTEMI) myocardial infarction: Secondary | ICD-10-CM | POA: Diagnosis not present

## 2020-05-20 DIAGNOSIS — Z170 Estrogen receptor positive status [ER+]: Secondary | ICD-10-CM | POA: Diagnosis not present

## 2020-05-20 DIAGNOSIS — Z90710 Acquired absence of both cervix and uterus: Secondary | ICD-10-CM | POA: Diagnosis not present

## 2020-05-20 DIAGNOSIS — I459 Conduction disorder, unspecified: Secondary | ICD-10-CM | POA: Diagnosis not present

## 2020-05-20 DIAGNOSIS — I2510 Atherosclerotic heart disease of native coronary artery without angina pectoris: Secondary | ICD-10-CM | POA: Diagnosis not present

## 2020-05-20 DIAGNOSIS — M1990 Unspecified osteoarthritis, unspecified site: Secondary | ICD-10-CM | POA: Diagnosis not present

## 2020-05-20 DIAGNOSIS — Z85828 Personal history of other malignant neoplasm of skin: Secondary | ICD-10-CM | POA: Diagnosis not present

## 2020-05-20 DIAGNOSIS — N390 Urinary tract infection, site not specified: Secondary | ICD-10-CM | POA: Diagnosis not present

## 2020-05-20 DIAGNOSIS — R011 Cardiac murmur, unspecified: Secondary | ICD-10-CM | POA: Diagnosis not present

## 2020-05-20 DIAGNOSIS — I4811 Longstanding persistent atrial fibrillation: Secondary | ICD-10-CM | POA: Diagnosis not present

## 2020-05-20 DIAGNOSIS — E785 Hyperlipidemia, unspecified: Secondary | ICD-10-CM | POA: Diagnosis not present

## 2020-05-20 DIAGNOSIS — Z7902 Long term (current) use of antithrombotics/antiplatelets: Secondary | ICD-10-CM | POA: Diagnosis not present

## 2020-05-20 DIAGNOSIS — Z87891 Personal history of nicotine dependence: Secondary | ICD-10-CM | POA: Diagnosis not present

## 2020-05-20 DIAGNOSIS — Z9181 History of falling: Secondary | ICD-10-CM | POA: Diagnosis not present

## 2020-05-20 DIAGNOSIS — Z853 Personal history of malignant neoplasm of breast: Secondary | ICD-10-CM | POA: Diagnosis not present

## 2020-05-20 DIAGNOSIS — Z7984 Long term (current) use of oral hypoglycemic drugs: Secondary | ICD-10-CM | POA: Diagnosis not present

## 2020-05-21 DIAGNOSIS — R011 Cardiac murmur, unspecified: Secondary | ICD-10-CM | POA: Diagnosis not present

## 2020-05-21 DIAGNOSIS — E1140 Type 2 diabetes mellitus with diabetic neuropathy, unspecified: Secondary | ICD-10-CM | POA: Diagnosis not present

## 2020-05-21 DIAGNOSIS — I4811 Longstanding persistent atrial fibrillation: Secondary | ICD-10-CM | POA: Diagnosis not present

## 2020-05-21 DIAGNOSIS — I2510 Atherosclerotic heart disease of native coronary artery without angina pectoris: Secondary | ICD-10-CM | POA: Diagnosis not present

## 2020-05-21 DIAGNOSIS — I214 Non-ST elevation (NSTEMI) myocardial infarction: Secondary | ICD-10-CM | POA: Diagnosis not present

## 2020-05-21 DIAGNOSIS — I459 Conduction disorder, unspecified: Secondary | ICD-10-CM | POA: Diagnosis not present

## 2020-05-22 ENCOUNTER — Encounter: Payer: Self-pay | Admitting: Internal Medicine

## 2020-05-22 ENCOUNTER — Telehealth: Payer: Self-pay | Admitting: Internal Medicine

## 2020-05-22 DIAGNOSIS — R300 Dysuria: Secondary | ICD-10-CM

## 2020-05-22 NOTE — Telephone Encounter (Signed)
Are you ok with her coming to pick up a cup in the morning and working her in if we need to? She has had multiple UTIs recently and she is not having any other acute issues except for the painful urination.

## 2020-05-22 NOTE — Telephone Encounter (Signed)
daughter n law called in for patient she would like to know if she come by to pick up a cup for urine sample patient possible has another UTI

## 2020-05-22 NOTE — Telephone Encounter (Signed)
Yes, I am ok with them picking up a cup and ok for visit.

## 2020-05-23 ENCOUNTER — Other Ambulatory Visit (INDEPENDENT_AMBULATORY_CARE_PROVIDER_SITE_OTHER): Payer: Medicare Other

## 2020-05-23 ENCOUNTER — Other Ambulatory Visit: Payer: Medicare Other

## 2020-05-23 ENCOUNTER — Other Ambulatory Visit: Payer: Self-pay

## 2020-05-23 DIAGNOSIS — R319 Hematuria, unspecified: Secondary | ICD-10-CM

## 2020-05-23 DIAGNOSIS — R300 Dysuria: Secondary | ICD-10-CM

## 2020-05-23 LAB — URINALYSIS, ROUTINE W REFLEX MICROSCOPIC
Bilirubin Urine: NEGATIVE
Hgb urine dipstick: NEGATIVE
Ketones, ur: NEGATIVE
Nitrite: POSITIVE — AB
RBC / HPF: NONE SEEN (ref 0–?)
Specific Gravity, Urine: 1.02 (ref 1.000–1.030)
Total Protein, Urine: NEGATIVE
Urine Glucose: NEGATIVE
Urobilinogen, UA: 0.2 (ref 0.0–1.0)
pH: 5.5 (ref 5.0–8.0)

## 2020-05-23 NOTE — Telephone Encounter (Signed)
Patient is ok to wait until Tuesday. Urine culture added.

## 2020-05-23 NOTE — Telephone Encounter (Signed)
Pt's daughter in law [name]) called to ask about MyChart message sent on behalf of [place]. Please call her back to advise @ [phone].

## 2020-05-23 NOTE — Telephone Encounter (Signed)
Holly Rose is coming to pick up a cup for a urine but there are no places to work her in this week. Unable to do a 12:00 tomorrow due to a meeting and you are booked today. Are you ok with seeing her the beginning of next week?

## 2020-05-23 NOTE — Telephone Encounter (Signed)
Please put in orders for urine and I am ok to add on tomorrow pm or beginning of next week - whichever they feel is needed.

## 2020-05-23 NOTE — Telephone Encounter (Signed)
See other my chart message. Advised that we would be unable to tell if it is yeast without actually doing a pelvic since she is not complaining of burning, itching, etc. [name] is coming to pick up a cup to collect a urine sample/

## 2020-05-24 LAB — URINE CULTURE
MICRO NUMBER:: 10929806
SPECIMEN QUALITY:: ADEQUATE

## 2020-05-27 ENCOUNTER — Encounter: Payer: Self-pay | Admitting: Internal Medicine

## 2020-05-28 ENCOUNTER — Encounter: Payer: Self-pay | Admitting: Internal Medicine

## 2020-05-28 ENCOUNTER — Ambulatory Visit (INDEPENDENT_AMBULATORY_CARE_PROVIDER_SITE_OTHER): Payer: Medicare Other | Admitting: Internal Medicine

## 2020-05-28 ENCOUNTER — Other Ambulatory Visit: Payer: Self-pay

## 2020-05-28 DIAGNOSIS — I4891 Unspecified atrial fibrillation: Secondary | ICD-10-CM | POA: Diagnosis not present

## 2020-05-28 DIAGNOSIS — I2510 Atherosclerotic heart disease of native coronary artery without angina pectoris: Secondary | ICD-10-CM

## 2020-05-28 DIAGNOSIS — C50912 Malignant neoplasm of unspecified site of left female breast: Secondary | ICD-10-CM

## 2020-05-28 DIAGNOSIS — R4182 Altered mental status, unspecified: Secondary | ICD-10-CM | POA: Diagnosis not present

## 2020-05-28 DIAGNOSIS — N390 Urinary tract infection, site not specified: Secondary | ICD-10-CM

## 2020-05-28 DIAGNOSIS — E1140 Type 2 diabetes mellitus with diabetic neuropathy, unspecified: Secondary | ICD-10-CM
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
## 2020-05-29 DIAGNOSIS — I459 Conduction disorder, unspecified: Secondary | ICD-10-CM | POA: Diagnosis not present

## 2020-05-29 DIAGNOSIS — I214 Non-ST elevation (NSTEMI) myocardial infarction: Secondary | ICD-10-CM | POA: Diagnosis not present

## 2020-05-29 DIAGNOSIS — I4811 Longstanding persistent atrial fibrillation: Secondary | ICD-10-CM | POA: Diagnosis not present

## 2020-05-29 DIAGNOSIS — R011 Cardiac murmur, unspecified: Secondary | ICD-10-CM | POA: Diagnosis not present

## 2020-05-29 DIAGNOSIS — I2510 Atherosclerotic heart disease of native coronary artery without angina pectoris: Secondary | ICD-10-CM | POA: Diagnosis not present

## 2020-05-29 DIAGNOSIS — E1140 Type 2 diabetes mellitus with diabetic neuropathy, unspecified: Secondary | ICD-10-CM | POA: Diagnosis not present

## 2020-05-29 NOTE — Telephone Encounter (Signed)
Pt evaluated.  See note.  Denied physical abuse.  Daughter-n-law agrees has not seen any evidence of physical abuse.

## 2020-05-30 ENCOUNTER — Encounter: Payer: Self-pay | Admitting: Internal Medicine

## 2020-05-31 ENCOUNTER — Encounter: Payer: Self-pay | Admitting: Internal Medicine

## 2020-05-31 NOTE — Assessment & Plan Note (Signed)
Recently admitted with NSTEMI.  Elected medical management as outlined.  Continue plavix, lisinopril, imdur, metoprolol.  Currently without chest pain or increased sob.  Continue f/u with cardiology.

## 2020-05-31 NOTE — Assessment & Plan Note (Signed)
S/p XRT.  On tamoxifen.  Stable.  

## 2020-05-31 NOTE — Assessment & Plan Note (Signed)
Appears to be in SR now.  Follow.  Continue f/u with cardiology.

## 2020-05-31 NOTE — Assessment & Plan Note (Signed)
Continues on metformin.  Follow met b and a1c.   Lab Results  Component Value Date   HGBA1C 6.6 (H) 04/15/2020

## 2020-05-31 NOTE — Assessment & Plan Note (Signed)
Has a history of UTIs.  Recently treated for infection. Urine just checked and clear.  Follow.

## 2020-05-31 NOTE — Assessment & Plan Note (Addendum)
Recent concern regarding altered mental status.  Recently admitted with NSTEMI.  Had recent head injury.  CT head negative.  Treated for UTI.  Saw neurology. Note reviewed. Were question if related to infection.  Recent urine cleared.  Persistent issues.  Now having visual hallucinations as outlined.  Given persistent issues as outlined, will need f/u with neurology.  Not sure if can have MRI with pacemaker.  Would need to clarify with cardiology if able to have MRI with her pacemaker and leads.  Hold on medication changes.

## 2020-06-01 NOTE — Addendum Note (Signed)
Addended by: Alisa Graff on: 06/01/2020 06:53 AM   Modules accepted: Orders

## 2020-06-03 ENCOUNTER — Telehealth: Payer: Self-pay | Admitting: Internal Medicine

## 2020-06-03 ENCOUNTER — Encounter: Payer: Self-pay | Admitting: Internal Medicine

## 2020-06-03 NOTE — Telephone Encounter (Signed)
I spoke with pt daughter about scheduling appt, Pt has a pacemaker due to pacemaker a different department will call pt to sch. I advised pt if she does not hear anything by Wednesday to call me.

## 2020-06-04 ENCOUNTER — Encounter (HOSPITAL_COMMUNITY): Payer: Self-pay | Admitting: *Deleted

## 2020-06-04 DIAGNOSIS — I459 Conduction disorder, unspecified: Secondary | ICD-10-CM | POA: Diagnosis not present

## 2020-06-04 DIAGNOSIS — I4811 Longstanding persistent atrial fibrillation: Secondary | ICD-10-CM | POA: Diagnosis not present

## 2020-06-04 DIAGNOSIS — R011 Cardiac murmur, unspecified: Secondary | ICD-10-CM | POA: Diagnosis not present

## 2020-06-04 DIAGNOSIS — I2510 Atherosclerotic heart disease of native coronary artery without angina pectoris: Secondary | ICD-10-CM | POA: Diagnosis not present

## 2020-06-04 DIAGNOSIS — E1140 Type 2 diabetes mellitus with diabetic neuropathy, unspecified: Secondary | ICD-10-CM | POA: Diagnosis not present

## 2020-06-04 DIAGNOSIS — I214 Non-ST elevation (NSTEMI) myocardial infarction: Secondary | ICD-10-CM | POA: Diagnosis not present

## 2020-06-05 ENCOUNTER — Ambulatory Visit (HOSPITAL_COMMUNITY): Payer: Medicare Other

## 2020-06-05 ENCOUNTER — Encounter (HOSPITAL_COMMUNITY): Payer: Self-pay

## 2020-06-05 NOTE — Telephone Encounter (Signed)
She wasn't positive of serial number and will call us back to give [place] the correct number.

## 2020-06-05 NOTE — Telephone Encounter (Signed)
Daughter called back to inquire about MRI being scheduled for her mom the pt. I sent msg to follow up on scheduling to centralized scheduling rep that schedules those pts with pacemakers.

## 2020-06-05 NOTE — Telephone Encounter (Signed)
Ok I will call to sch. Thanks!

## 2020-06-05 NOTE — Telephone Encounter (Signed)
Holly Rose called back and said Dr. Bethanne Ginger office faxed over to the hospital the information they need. They are just waiting on a call for the appointment.

## 2020-06-06 ENCOUNTER — Encounter: Payer: Self-pay | Admitting: Internal Medicine

## 2020-06-06 NOTE — Telephone Encounter (Signed)
Left message for Holly Rose to call back.

## 2020-06-06 NOTE — Telephone Encounter (Signed)
I had talked with cardiology and they ok'd the MRI (regarding her pacemaker).  In reviewing the chart, it appears that cardiology faxed information over yesterday.  I was not aware they denied.  If they are unable to do MRI, then we will f/u with neurology for further testing.  Not sure that PET testing is what we need.

## 2020-06-06 NOTE — Telephone Encounter (Signed)
Holly Rose returned your call

## 2020-06-06 NOTE — Telephone Encounter (Signed)
Left message for Holly Rose to call back

## 2020-06-07 NOTE — Telephone Encounter (Signed)
Spoke with [name] about this. Discussed this with Dr [name] yesterday about adding in a medication. Sending over for review. See me.

## 2020-06-07 NOTE — Telephone Encounter (Signed)
Left message for Holly Rose

## 2020-06-07 NOTE — Telephone Encounter (Signed)
Holly Rose called returning your call

## 2020-06-08 MED ORDER — MIRTAZAPINE 7.5 MG PO TABS: 7.5000 mg | ORAL_TABLET | Freq: Every day | ORAL | 1 refills | Status: DC

## 2020-06-08 NOTE — Telephone Encounter (Signed)
Spoke to [employer].  Discussed treatment options.  Trial of remeron.  Agreeable.  rx sent in for remeron 7.5mg  #30 with one refill.

## 2020-06-10 NOTE — Telephone Encounter (Signed)
See other note

## 2020-06-11 ENCOUNTER — Telehealth: Payer: Self-pay | Admitting: Internal Medicine

## 2020-06-11 NOTE — Telephone Encounter (Signed)
err

## 2020-06-13 ENCOUNTER — Encounter: Payer: Self-pay | Admitting: Internal Medicine

## 2020-06-13 DIAGNOSIS — R441 Visual hallucinations: Secondary | ICD-10-CM

## 2020-06-13 DIAGNOSIS — F419 Anxiety disorder, unspecified: Secondary | ICD-10-CM

## 2020-06-13 DIAGNOSIS — F439 Reaction to severe stress, unspecified: Secondary | ICD-10-CM

## 2020-06-14 NOTE — Telephone Encounter (Signed)
She is on a waiting list with Dr Manuella Ghazi.  Regarding the scan, we are unable to do MRI because of pacemaker.  PET scan is not the type of scan we need and she has already had CT scan.  If her behavior, hallucinations are the issue, can we get psychiatry set to see her to help with medication adjustment for the hallucinations, etc.  Let me know and I will place order for referral.

## 2020-06-17 DIAGNOSIS — H353221 Exudative age-related macular degeneration, left eye, with active choroidal neovascularization: Secondary | ICD-10-CM | POA: Diagnosis not present

## 2020-06-17 NOTE — Telephone Encounter (Signed)
Order placed for psych referral.   

## 2020-06-20 ENCOUNTER — Encounter: Payer: Self-pay | Admitting: Internal Medicine

## 2020-06-20 DIAGNOSIS — I214 Non-ST elevation (NSTEMI) myocardial infarction: Secondary | ICD-10-CM | POA: Diagnosis not present

## 2020-06-24 DIAGNOSIS — I341 Nonrheumatic mitral (valve) prolapse: Secondary | ICD-10-CM | POA: Diagnosis not present

## 2020-06-24 DIAGNOSIS — I214 Non-ST elevation (NSTEMI) myocardial infarction: Secondary | ICD-10-CM | POA: Diagnosis not present

## 2020-06-24 DIAGNOSIS — I4811 Longstanding persistent atrial fibrillation: Secondary | ICD-10-CM | POA: Diagnosis not present

## 2020-06-24 DIAGNOSIS — I495 Sick sinus syndrome: Secondary | ICD-10-CM | POA: Diagnosis not present

## 2020-06-24 DIAGNOSIS — E1169 Type 2 diabetes mellitus with other specified complication: Secondary | ICD-10-CM | POA: Diagnosis not present

## 2020-06-24 DIAGNOSIS — E7800 Pure hypercholesterolemia, unspecified: Secondary | ICD-10-CM | POA: Diagnosis not present

## 2020-06-24 DIAGNOSIS — E785 Hyperlipidemia, unspecified: Secondary | ICD-10-CM | POA: Diagnosis not present

## 2020-06-26 ENCOUNTER — Encounter: Payer: Self-pay | Admitting: Internal Medicine

## 2020-06-27 DIAGNOSIS — L728 Other follicular cysts of the skin and subcutaneous tissue: Secondary | ICD-10-CM | POA: Diagnosis not present

## 2020-06-27 DIAGNOSIS — D485 Neoplasm of uncertain behavior of skin: Secondary | ICD-10-CM | POA: Diagnosis not present

## 2020-06-27 DIAGNOSIS — Q820 Hereditary lymphedema: Secondary | ICD-10-CM | POA: Diagnosis not present

## 2020-06-27 NOTE — Telephone Encounter (Signed)
I have sent a message to [place]. I had received notification that Dr Waylan Boga office was going to reach out to them to schedule an appt.  I have sent a staff message to [place] - to f/u on this and see about an appt.   Elvina Mattes should be letting us know something - hopefully today.

## 2020-07-01 DIAGNOSIS — H353211 Exudative age-related macular degeneration, right eye, with active choroidal neovascularization: Secondary | ICD-10-CM | POA: Diagnosis not present

## 2020-07-03 ENCOUNTER — Encounter: Payer: Self-pay | Admitting: Internal Medicine

## 2020-07-04 ENCOUNTER — Other Ambulatory Visit: Payer: Self-pay

## 2020-07-04 NOTE — Telephone Encounter (Signed)
Need update.  Did she see psychiatry?  Here about earlier appt with neurology?  Is she eating, etc?

## 2020-07-05 ENCOUNTER — Telehealth: Payer: Self-pay | Admitting: *Deleted

## 2020-07-05 MED ORDER — DIVALPROEX SODIUM 125 MG PO DR TAB: DELAYED_RELEASE_TABLET | ORAL | 1 refills | Status: DC
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
## 2020-07-05 NOTE — Telephone Encounter (Signed)
Spoke to Dr Manuella Ghazi and to Dr Damita Dunnings.  Depakote rx sent in.  Dr Manuella Ghazi to schedule earlier follow up with Ms [name].  Discussed with [name].  She will let Linward Headland know of plan.

## 2020-07-05 NOTE — Addendum Note (Signed)
Addended by: Alisa Graff on: 07/05/2020 06:12 PM   Modules accepted: Orders

## 2020-07-08 ENCOUNTER — Encounter: Payer: Self-pay | Admitting: Internal Medicine

## 2020-07-08 ENCOUNTER — Encounter: Payer: Self-pay | Admitting: Podiatry

## 2020-07-08 ENCOUNTER — Telehealth: Payer: Self-pay | Admitting: *Deleted

## 2020-07-08 ENCOUNTER — Ambulatory Visit: Payer: Medicare Other | Admitting: Internal Medicine

## 2020-07-09 ENCOUNTER — Encounter: Payer: Self-pay | Admitting: Podiatry

## 2020-07-09 ENCOUNTER — Other Ambulatory Visit (HOSPITAL_COMMUNITY): Payer: Self-pay | Admitting: Neurology

## 2020-07-09 ENCOUNTER — Other Ambulatory Visit: Payer: Self-pay | Admitting: Neurology

## 2020-07-09 DIAGNOSIS — R410 Disorientation, unspecified: Secondary | ICD-10-CM

## 2020-07-09 DIAGNOSIS — I214 Non-ST elevation (NSTEMI) myocardial infarction: Secondary | ICD-10-CM | POA: Diagnosis not present

## 2020-07-09 DIAGNOSIS — R441 Visual hallucinations: Secondary | ICD-10-CM | POA: Diagnosis not present

## 2020-07-09 DIAGNOSIS — E538 Deficiency of other specified B group vitamins: Secondary | ICD-10-CM | POA: Diagnosis not present

## 2020-07-09 DIAGNOSIS — E519 Thiamine deficiency, unspecified: Secondary | ICD-10-CM | POA: Diagnosis not present

## 2020-07-09 NOTE — Telephone Encounter (Signed)
Called and spoke to [employer]. Holly Rose states that she is going to see her Neurologist today at 11am and she will see her psychiatrists on December 15th. She is not eating and is having the same issues as discussed before with Dr. [name]. Holly Rose states that they are going to ask the neurologist today about all of the issues and about a plan for care.

## 2020-07-09 NOTE — Telephone Encounter (Signed)
Already addressed with pt.  See other note.

## 2020-07-10 ENCOUNTER — Ambulatory Visit (INDEPENDENT_AMBULATORY_CARE_PROVIDER_SITE_OTHER): Payer: Medicare Other | Admitting: Podiatry

## 2020-07-10 ENCOUNTER — Other Ambulatory Visit: Payer: Self-pay

## 2020-07-10 ENCOUNTER — Encounter: Payer: Self-pay | Admitting: Podiatry

## 2020-07-10 DIAGNOSIS — L97521 Non-pressure chronic ulcer of other part of left foot limited to breakdown of skin: Secondary | ICD-10-CM

## 2020-07-10 DIAGNOSIS — M79609 Pain in unspecified limb: Secondary | ICD-10-CM | POA: Diagnosis not present

## 2020-07-10 DIAGNOSIS — B351 Tinea unguium: Secondary | ICD-10-CM | POA: Diagnosis not present

## 2020-07-10 DIAGNOSIS — Q828 Other specified congenital malformations of skin: Secondary | ICD-10-CM

## 2020-07-10 DIAGNOSIS — L97511 Non-pressure chronic ulcer of other part of right foot limited to breakdown of skin: Secondary | ICD-10-CM

## 2020-07-10 DIAGNOSIS — I2510 Atherosclerotic heart disease of native coronary artery without angina pectoris: Secondary | ICD-10-CM

## 2020-07-10 DIAGNOSIS — E1142 Type 2 diabetes mellitus with diabetic polyneuropathy: Secondary | ICD-10-CM | POA: Diagnosis not present

## 2020-07-10 MED ORDER — MUPIROCIN CALCIUM 2 % EX CREA: 1.0000 "application " | TOPICAL_CREAM | Freq: Two times a day (BID) | CUTANEOUS | 0 refills | Status: DC

## 2020-07-10 NOTE — Progress Notes (Signed)
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  She states that she is having a lot more pain in the forefoot left and that of the right.  She is also complaining of painful elongated toenails.  Objective: Vital signs are stable she is alert oriented x3 pulses are barely palpable as before capillary fill time is sluggish at best.  Toenails are long thick yellow dystrophic clinically mycotic bilaterally plantar aspect of the left foot does demonstrate an ulcerative lesion which is fairly small measuring about a centimeter in length by about a half a centimeter in width there is no purulence or no malodor however there was undermining of the epidermis the dermal epidermal junction to the level of the amputated second and third toes the left foot.  I did debride all of this epidermis leaving dermis and some new skin visible..  I will applied Silvadene to the wound.  The right foot does demonstrate a blood blister and I did treated the area subsecond metatarsal phalangeal joint area right today onto a superficial ulceration.  Assessment: Most likely with her brain injury she has a gait disturbance where she is prepped putting more pressure on the forefoot resulting in skin breakdown.  Set pressure ulceration left.  Similarly right with painfully elongated nails.  Plan: This point debrided nails bilaterally.  Debrided ulceration bilaterally.  Called and Bactroban ointment for her she is going to have it dressed on a daily basis and I would like to follow-up with her in about 2 weeks to make sure she is doing well should this become more problematic or painful or purulent they are to notify us immediately.

## 2020-07-11 ENCOUNTER — Other Ambulatory Visit: Payer: Self-pay | Admitting: *Deleted

## 2020-07-11 MED ORDER — MUPIROCIN 2 % EX OINT: 1.0000 "application " | TOPICAL_OINTMENT | Freq: Two times a day (BID) | CUTANEOUS | 1 refills | Status: DC

## 2020-07-11 NOTE — Telephone Encounter (Signed)
Pts daughter in law, [name], presented to the office and stated that the mupirocin cream (BACTROBAN) 2 % is not covered by the pts insurance and the cost is $180, is there an alternative treatment? Please advise.

## 2020-07-16 ENCOUNTER — Ambulatory Visit (INDEPENDENT_AMBULATORY_CARE_PROVIDER_SITE_OTHER): Payer: Medicare Other | Admitting: Internal Medicine

## 2020-07-16 ENCOUNTER — Encounter: Payer: Self-pay | Admitting: Internal Medicine

## 2020-07-16 ENCOUNTER — Other Ambulatory Visit: Payer: Self-pay

## 2020-07-16 DIAGNOSIS — E1140 Type 2 diabetes mellitus with diabetic neuropathy, unspecified: Secondary | ICD-10-CM

## 2020-07-16 DIAGNOSIS — I4891 Unspecified atrial fibrillation: Secondary | ICD-10-CM

## 2020-07-16 DIAGNOSIS — E1165 Type 2 diabetes mellitus with hyperglycemia: Secondary | ICD-10-CM | POA: Diagnosis not present

## 2020-07-16 DIAGNOSIS — E785 Hyperlipidemia, unspecified: Secondary | ICD-10-CM | POA: Diagnosis not present

## 2020-07-16 DIAGNOSIS — D509 Iron deficiency anemia, unspecified: Secondary | ICD-10-CM

## 2020-07-16 DIAGNOSIS — I2510 Atherosclerotic heart disease of native coronary artery without angina pectoris: Secondary | ICD-10-CM

## 2020-07-16 DIAGNOSIS — C50912 Malignant neoplasm of unspecified site of left female breast: Secondary | ICD-10-CM | POA: Diagnosis not present

## 2020-07-16 DIAGNOSIS — R441 Visual hallucinations: Secondary | ICD-10-CM | POA: Diagnosis not present

## 2020-07-16 NOTE — Progress Notes (Signed)
Patient ID: Holly Rose, female   DOB: Apr 07, 1930, 86 y.o.   MRN: [id number]   Subjective:    Patient ID: Holly Rose, female    DOB: October 11, 1929, 86 y.o.   MRN: [id number]  HPI This visit occurred during the SARS-CoV-2 public health emergency.  Safety protocols were in place, including screening questions prior to the visit, additional usage of staff PPE, and extensive cleaning of exam room while observing appropriate contact time as indicated for disinfecting solutions.  Patient here for a scheduled follow up.  She is accompanied by Cheryll Dessert nephew.  Off lisinopril.  On decreased metoprolol.  Stays active - walking around her house, etc.  Eating.  No nausea or vomiting.  No chest pain or sob. No diarrhea.  On depakote.  Seeing neurology.  Still having episodes of confusion with visual hallucinations and delusions.  Per their note, concerning for component of Lewy Body Dementia.  Started on depakote.  Persistent hallucinations.  Discussed with her today.   Past Medical History:  Diagnosis Date  . Allergy   . Anemia   . Arthritis   . Atrial fibrillation ([place]) 2004  . Breast cancer, left ([place]) 10/2017   Lumpectomy and rad tx's.   Marland Kitchen CAD (coronary artery disease)   . Complication of anesthesia    hard to wake up with general anesthesia  . Diabetes ([place])   . Dysrhythmia    A-fib  . Heart attack ([place]) 2008  . Heart murmur   . History of kidney stones   . History of sick sinus syndrome    s/p pacemaker placement  . Hyperlipidemia   . Macular degeneration   . Neuropathy   . Neuropathy   . Pacemaker 06/2010  . Personal history of radiation therapy 11/2017   LEFT lumpectomy   . Squamous cell skin cancer   . TIA (transient ischemic attack)    Past Surgical History:  Procedure Laterality Date  . ABDOMINAL HYSTERECTOMY  1960's  . APPENDECTOMY  1947  . [place]  . BREAST BIOPSY Left 12/17/2016   SINGLE FRAGMENT OF ATYPICAL EPITHELIAL CELLS  .  BREAST BIOPSY Left 04/08/2017   [facility]  . BREAST EXCISIONAL BIOPSY Left 01/18/1986   Benign microcalcifications, [employer].  Marland Kitchen BREAST LUMPECTOMY Left 05/26/2017   13 mm,T1c, N0; ER/ PR+, Her 2 neu negative, HIGH RISK by [place] ;  Surgeon: [name], MD;  Location: [facility] ORS;  Service: General;  Laterality: Left;  . BREAST SURGERY  1988  . CARDIAC CATHETERIZATION  1989  . CATARACT EXTRACTION  1997 and 1998  . COLONOSCOPY WITH PROPOFOL N/A 09/12/2015   Procedure: COLONOSCOPY WITH PROPOFOL;  Surgeon: [name], MD;  Location: [facility];  Service: Endoscopy;  Laterality: N/A;  . IMPLANTABLE CARDIOVERTER DEFIBRILLATOR (ICD) GENERATOR CHANGE Left 03/29/2019   Procedure: PACEMAKER CHANGE OUT;  Surgeon: [name], MD;  Location: [facility] ORS;  Service: Cardiovascular;  Laterality: Left;  . KIDNEY STONE SURGERY  2018  . MOHS SURGERY  2019   forehead  . pace maker  2008  . [place]  . TONSILLECTOMY AND ADENOIDECTOMY  1950"s   Family History  Problem Relation Age of Onset  . Stroke Mother   . Diabetes Mother   . Cancer Maternal Aunt        Breast Cancer  . Breast cancer Maternal Aunt   . Arthritis Maternal Grandmother   . Cancer Maternal Aunt  Lung Cancer  . Diabetes Son   . Cancer Son   . Kidney disease Neg Hx   . Bladder Cancer Neg Hx    Social History   Socioeconomic History  . Marital status: Married    Spouse name: Holly Rose  . Number of children: 2  . Years of education: Not on file  . Highest education level: Not on file  Occupational History  . Not on file  Tobacco Use  . Smoking status: Former Smoker    Years: 5.00    Quit date: 09/14/1968    Years since quitting: 51.8  . Smokeless tobacco: Never Used  . Tobacco comment: quit 1970  Vaping Use  . Vaping Use: Never used  Substance and Sexual Activity  . Alcohol use: No    Alcohol/week: 0.0 standard drinks  . Drug use: No  . Sexual activity: Not on file  Other Topics  Concern  . Not on file  Social History Narrative   Live home with husband in [place]   Social Determinants of Health   Financial Resource Strain:   . Difficulty of Paying Living Expenses: Not on file  Food Insecurity:   . Worried About Charity fundraiser in the Last Year: Not on file  . Ran Out of Food in the Last Year: Not on file  Transportation Needs:   . Lack of Transportation (Medical): Not on file  . Lack of Transportation (Non-Medical): Not on file  Physical Activity:   . Days of Exercise per Week: Not on file  . Minutes of Exercise per Session: Not on file  Stress:   . Feeling of Stress : Not on file  Social Connections:   . Frequency of Communication with Friends and Family: Not on file  . Frequency of Social Gatherings with Friends and Family: Not on file  . Attends Religious Services: Not on file  . Active Member of Clubs or Organizations: Not on file  . Attends Archivist Meetings: Not on file  . Marital Status: Not on file    Outpatient Encounter Medications as of 07/16/2020  Medication Sig  . Aflibercept (EYLEA) 2 MG/0.05ML SOLN 1 each by Intravitreal route as directed.   Marland Kitchen alendronate (FOSAMAX) 70 MG tablet Take 1 tablet (70 mg total) by mouth once a week. Take with a full glass of water on an empty stomach.  Marland Kitchen atorvastatin (LIPITOR) 40 MG tablet Take 1 tablet (40 mg total) by mouth daily.  Marland Kitchen BAYER ASPIRIN EC LOW DOSE 81 MG EC tablet Take by mouth.  . Biotin w/ Vitamins C & E (HAIR/SKIN/NAILS PO) Take 3 tablets by mouth daily. 2 in the am, 1 in the pm  . blood glucose meter kit and supplies Per patient please dispense One touch Ultra2 meter.Use meter and supplies three times a day to check blood sugar. ICD10: E11.40  . Blood Glucose Monitoring Suppl (FREESTYLE LITE) DEVI 2 (two) times daily.  . Cholecalciferol (VITAMIN D) 50 MCG (2000 UT) CAPS Take 4,000 Units by mouth daily.  . clopidogrel (PLAVIX) 75 MG tablet Take 1 tablet (75 mg total)  by mouth daily.  . divalproex (DEPAKOTE) 125 MG DR tablet Take one tablet q hs for one week and then one tablet bid  . furosemide (LASIX) 20 MG tablet Take 1 tablet (20 mg total) by mouth 2 (two) times daily.  Marland Kitchen glucose blood test strip Use as instructed to check sugars twice daily. Dx E11.9  . isosorbide mononitrate (IMDUR) 30  MG 24 hr tablet Take 1 tablet (30 mg total) by mouth daily.  . Lancets (FREESTYLE) lancets 2 (two) times daily.  . Magnesium 200 MG TABS Take 200 mg by mouth 2 (two) times a day.  . meclizine (ANTIVERT) 12.5 MG tablet Take 1 tablet (12.5 mg total) by mouth 2 (two) times daily as needed for dizziness.  . metFORMIN (GLUCOPHAGE-XR) 500 MG 24 hr tablet Take 1 tablet (500 mg total) by mouth 2 (two) times daily.  . metoprolol tartrate (LOPRESSOR) 25 MG tablet Take 25 mg by mouth 2 (two) times daily.  . mirtazapine (REMERON) 7.5 MG tablet Take 1 tablet (7.5 mg total) by mouth at bedtime.  . Multiple Vitamins-Minerals (PRESERVISION AREDS 2 PO) Take 1 tablet by mouth 2 (two) times daily.   . mupirocin cream (BACTROBAN) 2 % Apply 1 application topically 2 (two) times daily.  . mupirocin ointment (BACTROBAN) 2 % Apply 1 application topically 2 (two) times daily.  . nitroGLYCERIN (NITROSTAT) 0.4 MG SL tablet SMARTSIG:1 Tablet(s) Sublingual PRN  . Polyethyl Glycol-Propyl Glycol (SYSTANE OP) Apply 1 drop to eye 2 (two) times daily.  . Probiotic Product (PROBIOTIC DAILY PO) Take by mouth.  . tamoxifen (NOLVADEX) 20 MG tablet Take 1 tablet by mouth once daily  . vitamin B-12 (CYANOCOBALAMIN) 500 MCG tablet Take 500 mcg by mouth 2 (two) times a day.  . [DISCONTINUED] lisinopril (ZESTRIL) 5 MG tablet Take 1 tablet (5 mg total) by mouth daily.  . [DISCONTINUED] metoprolol (LOPRESSOR) 50 MG tablet Take 25 mg by mouth 2 (two) times daily.  (Patient not taking: Reported on 07/16/2020)   No facility-administered encounter medications on file as of 07/16/2020.    Review of Systems    Constitutional: Negative for appetite change and unexpected weight change.  HENT: Negative for congestion and sinus pressure.   Respiratory: Negative for cough, chest tightness and shortness of breath.   Cardiovascular: Negative for chest pain, palpitations and leg swelling.  Gastrointestinal: Negative for abdominal pain, diarrhea, nausea and vomiting.  Genitourinary: Negative for difficulty urinating and dysuria.  Musculoskeletal: Negative for joint swelling and myalgias.  Skin: Negative for color change and rash.  Neurological: Negative for dizziness, light-headedness and headaches.  Psychiatric/Behavioral: Negative for agitation and dysphoric mood.       Objective:    Physical Exam Vitals reviewed.  Constitutional:      General: She is not in acute distress.    Appearance: Normal appearance.  HENT:     Head: Normocephalic and atraumatic.  Eyes:     General:        Right eye: No discharge.        Left eye: No discharge.     Conjunctiva/sclera: Conjunctivae normal.  Neck:     Thyroid: No thyromegaly.  Cardiovascular:     Rate and Rhythm: Normal rate and regular rhythm.  Pulmonary:     Effort: No respiratory distress.     Breath sounds: Normal breath sounds. No wheezing.  Abdominal:     General: Bowel sounds are normal.     Palpations: Abdomen is soft.     Tenderness: There is no abdominal tenderness.  Musculoskeletal:        General: No swelling or tenderness.     Cervical back: Neck supple. No tenderness.  Lymphadenopathy:     Cervical: No cervical adenopathy.  Skin:    Findings: No erythema or rash.  Neurological:     Mental Status: She is alert.  Psychiatric:  Mood and Affect: Mood normal.        Behavior: Behavior normal.     BP 116/78 (BP Location: Left Arm, Patient Position: Sitting, Cuff Size: Normal)   Pulse (!) 50   Temp 97.8 F (36.6 C) (Oral)   Resp 16   Ht _0  (1.575 m)   Wt 130 lb 12.8 oz (59.3 kg)   LMP  (LMP Unknown)   SpO2 99%    BMI 23.92 kg/m  Wt Readings from Last 3 Encounters:  07/16/20 130 lb 12.8 oz (59.3 kg)  05/28/20 143 lb 12.8 oz (65.2 kg)  05/01/20 140 lb 12.8 oz (63.9 kg)     Lab Results  Component Value Date   WBC 3.6 (L) 04/17/2020   HGB 11.4 (L) 04/17/2020   HCT 35.8 (L) 04/17/2020   PLT 167 04/17/2020   GLUCOSE 126 (H) 04/17/2020   CHOL 145 04/17/2020   TRIG 89 04/17/2020   HDL 47 04/17/2020   LDLCALC 80 04/17/2020   ALT 29 04/15/2020   AST 32 04/15/2020   NA 138 04/17/2020   K 3.8 04/17/2020   CL 105 04/17/2020   CREATININE 0.54 04/17/2020   BUN 9 04/17/2020   CO2 24 04/17/2020   TSH 1.860 04/15/2020   INR 1.2 04/17/2020   HGBA1C 6.6 (H) 04/15/2020   MICROALBUR 65.4 (H) 03/09/2016       Assessment & Plan:   Problem List Items Addressed This Visit    Visual hallucinations    Seeing neurology.  Undergoing w/up.  Concern regarding lewy body dementia.  Follow.       Type II diabetes mellitus ([place])    On metformin.  Follow sugars.  Follow met b and a1c.       Malignant neoplasm of left female breast ([place])    S/p XRT.  On tamoxifen.  Stable.       Iron deficiency anemia    Follow cbc and iron studies.        Hyperlipidemia    On lipitor.  Follow lipid panel and liver function tests.        Relevant Medications   metoprolol tartrate (LOPRESSOR) 25 MG tablet   Diabetes mellitus with neuropathy ([place])    Follow met b and a1c.   Lab Results  Component Value Date   HGBA1C 6.6 (H) 04/15/2020        CAD (coronary artery disease)    Recently admitted with NSTEMI.  Elected Conservation officer, nature.  Continue plavix and imdur.  Off lisinopril.  On decreased metoprolol.  Denies chest pain or sob.  Follow.        Relevant Medications   metoprolol tartrate (LOPRESSOR) 25 MG tablet   Atrial fibrillation with RVR ([place])    Appears to be in SR now.  Continue f/u with cardiology.        Relevant Medications   metoprolol tartrate (LOPRESSOR) 25 MG tablet       [name], MD

## 2020-07-18 NOTE — Telephone Encounter (Signed)
PCP follow up

## 2020-07-19 ENCOUNTER — Encounter: Payer: Self-pay | Admitting: Internal Medicine

## 2020-07-21 ENCOUNTER — Encounter: Payer: Self-pay | Admitting: Internal Medicine

## 2020-07-21 DIAGNOSIS — R441 Visual hallucinations: Secondary | ICD-10-CM | POA: Insufficient documentation

## 2020-07-21 NOTE — Assessment & Plan Note (Signed)
On metformin.  Follow sugars.  Follow met b and a1c.

## 2020-07-21 NOTE — Assessment & Plan Note (Signed)
Follow cbc and iron studies.  

## 2020-07-21 NOTE — Assessment & Plan Note (Signed)
S/p XRT.  On tamoxifen.  Stable.

## 2020-07-21 NOTE — Assessment & Plan Note (Signed)
Recently admitted with NSTEMI.  Elected Conservation officer, nature.  Continue plavix and imdur.  Off lisinopril.  On decreased metoprolol.  Denies chest pain or sob.  Follow.

## 2020-07-21 NOTE — Assessment & Plan Note (Signed)
Seeing neurology.  Undergoing w/up.  Concern regarding lewy body dementia.  Follow.

## 2020-07-21 NOTE — Assessment & Plan Note (Signed)
Follow met b and a1c.   Lab Results  Component Value Date   HGBA1C 6.6 (H) 04/15/2020

## 2020-07-21 NOTE — Assessment & Plan Note (Signed)
Appears to be in SR now.  Continue f/u with cardiology.

## 2020-07-21 NOTE — Assessment & Plan Note (Signed)
On lipitor.  Follow lipid panel and liver function tests.   

## 2020-07-22 ENCOUNTER — Other Ambulatory Visit: Payer: Medicare Other

## 2020-07-22 ENCOUNTER — Observation Stay
Admission: EM | Admit: 2020-07-22 | Discharge: 2020-07-23 | Disposition: A | Discharge status: Home / Self Care | Payer: Medicare Other | Attending: Internal Medicine | Admitting: Internal Medicine

## 2020-07-22 ENCOUNTER — Other Ambulatory Visit: Payer: Self-pay

## 2020-07-22 ENCOUNTER — Observation Stay: Payer: Medicare Other

## 2020-07-22 ENCOUNTER — Ambulatory Visit
Admission: RE | Admit: 2020-07-22 | Discharge: 2020-07-22 | Disposition: A | Discharge status: Home / Self Care | Payer: Medicare Other | Source: Ambulatory Visit | Attending: Neurology | Admitting: Neurology

## 2020-07-22 DIAGNOSIS — I6523 Occlusion and stenosis of bilateral carotid arteries: Secondary | ICD-10-CM | POA: Diagnosis not present

## 2020-07-22 DIAGNOSIS — F32A Depression, unspecified: Secondary | ICD-10-CM | POA: Diagnosis present

## 2020-07-22 DIAGNOSIS — E1140 Type 2 diabetes mellitus with diabetic neuropathy, unspecified: Secondary | ICD-10-CM | POA: Diagnosis present

## 2020-07-22 DIAGNOSIS — Z87891 Personal history of nicotine dependence: Secondary | ICD-10-CM | POA: Diagnosis not present

## 2020-07-22 DIAGNOSIS — F320 Major depressive disorder, single episode, mild: Secondary | ICD-10-CM | POA: Diagnosis present

## 2020-07-22 DIAGNOSIS — Z7984 Long term (current) use of oral hypoglycemic drugs: Secondary | ICD-10-CM | POA: Diagnosis not present

## 2020-07-22 DIAGNOSIS — E041 Nontoxic single thyroid nodule: Secondary | ICD-10-CM | POA: Diagnosis not present

## 2020-07-22 DIAGNOSIS — Z950 Presence of cardiac pacemaker: Secondary | ICD-10-CM | POA: Diagnosis not present

## 2020-07-22 DIAGNOSIS — I4891 Unspecified atrial fibrillation: Secondary | ICD-10-CM | POA: Diagnosis present

## 2020-07-22 DIAGNOSIS — Z7982 Long term (current) use of aspirin: Secondary | ICD-10-CM | POA: Insufficient documentation

## 2020-07-22 DIAGNOSIS — I6621 Occlusion and stenosis of right posterior cerebral artery: Secondary | ICD-10-CM | POA: Diagnosis not present

## 2020-07-22 DIAGNOSIS — I480 Paroxysmal atrial fibrillation: Secondary | ICD-10-CM | POA: Diagnosis not present

## 2020-07-22 DIAGNOSIS — I639 Cerebral infarction, unspecified: Principal | ICD-10-CM | POA: Insufficient documentation

## 2020-07-22 DIAGNOSIS — Z79899 Other long term (current) drug therapy: Secondary | ICD-10-CM | POA: Insufficient documentation

## 2020-07-22 DIAGNOSIS — F0390 Unspecified dementia without behavioral disturbance: Secondary | ICD-10-CM | POA: Diagnosis not present

## 2020-07-22 DIAGNOSIS — R42 Dizziness and giddiness: Secondary | ICD-10-CM | POA: Diagnosis not present

## 2020-07-22 DIAGNOSIS — R410 Disorientation, unspecified: Secondary | ICD-10-CM | POA: Insufficient documentation

## 2020-07-22 DIAGNOSIS — D509 Iron deficiency anemia, unspecified: Secondary | ICD-10-CM | POA: Insufficient documentation

## 2020-07-22 DIAGNOSIS — R443 Hallucinations, unspecified: Secondary | ICD-10-CM | POA: Diagnosis not present

## 2020-07-22 DIAGNOSIS — I25119 Atherosclerotic heart disease of native coronary artery with unspecified angina pectoris: Secondary | ICD-10-CM | POA: Diagnosis present

## 2020-07-22 DIAGNOSIS — R4182 Altered mental status, unspecified: Secondary | ICD-10-CM | POA: Diagnosis present

## 2020-07-22 DIAGNOSIS — I2510 Atherosclerotic heart disease of native coronary artery without angina pectoris: Secondary | ICD-10-CM | POA: Diagnosis not present

## 2020-07-22 DIAGNOSIS — E785 Hyperlipidemia, unspecified: Secondary | ICD-10-CM | POA: Diagnosis present

## 2020-07-22 DIAGNOSIS — Z7901 Long term (current) use of anticoagulants: Secondary | ICD-10-CM | POA: Diagnosis not present

## 2020-07-22 DIAGNOSIS — R441 Visual hallucinations: Secondary | ICD-10-CM

## 2020-07-22 DIAGNOSIS — G459 Transient cerebral ischemic attack, unspecified: Secondary | ICD-10-CM | POA: Insufficient documentation

## 2020-07-22 LAB — DIFFERENTIAL
Abs Immature Granulocytes: 0.01 10*3/uL (ref 0.00–0.07)
Basophils Absolute: 0 10*3/uL (ref 0.0–0.1)
Basophils Relative: 1 %
Eosinophils Absolute: 0 10*3/uL (ref 0.0–0.5)
Eosinophils Relative: 0 %
Immature Granulocytes: 0 %
Lymphocytes Relative: 22 %
Lymphs Abs: 0.8 10*3/uL (ref 0.7–4.0)
Monocytes Absolute: 0.3 10*3/uL (ref 0.1–1.0)
Monocytes Relative: 9 %
Neutro Abs: 2.3 10*3/uL (ref 1.7–7.7)
Neutrophils Relative %: 68 %

## 2020-07-22 LAB — COMPREHENSIVE METABOLIC PANEL
ALT: 28 U/L (ref 0–44)
AST: 37 U/L (ref 15–41)
Albumin: 3.2 g/dL — ABNORMAL LOW (ref 3.5–5.0)
Alkaline Phosphatase: 28 U/L — ABNORMAL LOW (ref 38–126)
Anion gap: 7 (ref 5–15)
BUN: 18 mg/dL (ref 8–23)
CO2: 29 mmol/L (ref 22–32)
Calcium: 9.2 mg/dL (ref 8.9–10.3)
Chloride: 103 mmol/L (ref 98–111)
Creatinine, Ser: 0.79 mg/dL (ref 0.44–1.00)
GFR, Estimated: >60 mL/min (ref >60)
Glucose, Bld: 96 mg/dL (ref 70–99)
Potassium: 3.6 mmol/L (ref 3.5–5.1)
Sodium: 139 mmol/L (ref 135–145)
Total Bilirubin: 0.9 mg/dL (ref 0.3–1.2)
Total Protein: 6.9 g/dL (ref 6.5–8.1)

## 2020-07-22 LAB — CBC
HCT: 29.8 % — ABNORMAL LOW (ref 36.0–46.0)
Hemoglobin: 9.5 g/dL — ABNORMAL LOW (ref 12.0–15.0)
MCH: 30.2 pg (ref 26.0–34.0)
MCHC: 31.9 g/dL (ref 30.0–36.0)
MCV: 94.6 fL (ref 80.0–100.0)
Platelets: 171 10*3/uL (ref 150–400)
RBC: 3.15 MIL/uL — ABNORMAL LOW (ref 3.87–5.11)
RDW: 16.6 % — ABNORMAL HIGH (ref 11.5–15.5)
WBC: 3.4 10*3/uL — ABNORMAL LOW (ref 4.0–10.5)
nRBC: 0 % (ref 0.0–0.2)

## 2020-07-22 LAB — PROTIME-INR
INR: 1.2 (ref 0.8–1.2)
Prothrombin Time: 14.9 seconds (ref 11.4–15.2)

## 2020-07-22 LAB — APTT: aPTT: 31 seconds (ref 24–36)

## 2020-07-22 LAB — GLUCOSE, CAPILLARY: Glucose-Capillary: 155 mg/dL — ABNORMAL HIGH (ref 70–99)

## 2020-07-22 MED ORDER — RISAQUAD PO CAPS
1.0000 | ORAL_CAPSULE | Freq: Every day | ORAL | Status: DC
  Administered 2020-07-23: 1 via ORAL
  Filled 2020-07-22: qty 1

## 2020-07-22 MED ORDER — INSULIN ASPART 100 UNIT/ML ~~LOC~~ SOLN: 0.0000 [IU] | Freq: Every day | SUBCUTANEOUS | Status: DC

## 2020-07-22 MED ORDER — MIRTAZAPINE 15 MG PO TABS
7.5000 mg | ORAL_TABLET | Freq: Every day | ORAL | Status: DC
  Administered 2020-07-22: 7.5 mg via ORAL
  Filled 2020-07-22: qty 1

## 2020-07-22 MED ORDER — ISOSORBIDE MONONITRATE ER 30 MG PO TB24
30.0000 mg | ORAL_TABLET | Freq: Every day | ORAL | Status: DC
  Administered 2020-07-23: 30 mg via ORAL
  Filled 2020-07-22 (×2): qty 1

## 2020-07-22 MED ORDER — SODIUM CHLORIDE 0.9% FLUSH
3.0000 mL | Freq: Once | INTRAVENOUS | Status: AC
Start: 2020-07-22 — End: 2020-07-22
  Administered 2020-07-22: 3 mL via INTRAVENOUS

## 2020-07-22 MED ORDER — ENOXAPARIN SODIUM 40 MG/0.4ML ~~LOC~~ SOLN
40.0000 mg | SUBCUTANEOUS | Status: DC
  Administered 2020-07-22: 40 mg via SUBCUTANEOUS
  Filled 2020-07-22: qty 0.4

## 2020-07-22 MED ORDER — STROKE: EARLY STAGES OF RECOVERY BOOK: Freq: Once | Status: DC

## 2020-07-22 MED ORDER — INSULIN ASPART 100 UNIT/ML ~~LOC~~ SOLN
0.0000 [IU] | Freq: Three times a day (TID) | SUBCUTANEOUS | Status: DC
  Administered 2020-07-23: 2 [IU] via SUBCUTANEOUS
  Filled 2020-07-22: qty 1

## 2020-07-22 MED ORDER — MECLIZINE HCL 12.5 MG PO TABS
12.5000 mg | ORAL_TABLET | Freq: Two times a day (BID) | ORAL | Status: DC | PRN
  Filled 2020-07-22: qty 1

## 2020-07-22 MED ORDER — ASPIRIN EC 81 MG PO TBEC
81.0000 mg | DELAYED_RELEASE_TABLET | Freq: Every day | ORAL | Status: DC
  Administered 2020-07-23: 81 mg via ORAL
  Filled 2020-07-22: qty 1

## 2020-07-22 MED ORDER — TAMOXIFEN CITRATE 10 MG PO TABS
20.0000 mg | ORAL_TABLET | Freq: Every day | ORAL | Status: DC
  Administered 2020-07-23: 09:00:00 20 mg via ORAL
  Filled 2020-07-22: qty 2

## 2020-07-22 MED ORDER — CLOPIDOGREL BISULFATE 75 MG PO TABS
75.0000 mg | ORAL_TABLET | Freq: Every day | ORAL | Status: DC
  Administered 2020-07-22 – 2020-07-23 (×2): 75 mg via ORAL
  Filled 2020-07-22 (×2): qty 1

## 2020-07-22 MED ORDER — ACETAMINOPHEN 650 MG RE SUPP: 650.0000 mg | RECTAL | Status: DC | PRN

## 2020-07-22 MED ORDER — METOPROLOL TARTRATE 25 MG PO TABS
25.0000 mg | ORAL_TABLET | Freq: Two times a day (BID) | ORAL | Status: DC
  Administered 2020-07-22 – 2020-07-23 (×2): 25 mg via ORAL
  Filled 2020-07-22 (×2): qty 1

## 2020-07-22 MED ORDER — IOHEXOL 350 MG/ML SOLN
75.0000 mL | Freq: Once | INTRAVENOUS | Status: AC | PRN
  Administered 2020-07-22: 75 mL via INTRAVENOUS

## 2020-07-22 MED ORDER — FUROSEMIDE 20 MG PO TABS
20.0000 mg | ORAL_TABLET | Freq: Two times a day (BID) | ORAL | Status: DC
  Administered 2020-07-22 – 2020-07-23 (×2): 20 mg via ORAL
  Filled 2020-07-22 (×2): qty 1

## 2020-07-22 MED ORDER — MUPIROCIN CALCIUM 2 % EX CREA
1.0000 "application " | TOPICAL_CREAM | Freq: Two times a day (BID) | CUTANEOUS | Status: DC
  Administered 2020-07-22 – 2020-07-23 (×2): 1 via TOPICAL
  Filled 2020-07-22: qty 15

## 2020-07-22 MED ORDER — HAIR/SKIN/NAILS PO TABS: 1.0000 | ORAL_TABLET | Freq: Every day | ORAL | Status: DC

## 2020-07-22 MED ORDER — ARTIFICIAL TEARS OPHTHALMIC OINT
TOPICAL_OINTMENT | Freq: Two times a day (BID) | OPHTHALMIC | Status: DC
  Filled 2020-07-22: qty 3.5

## 2020-07-22 MED ORDER — MAGNESIUM OXIDE 400 (241.3 MG) MG PO TABS
200.0000 mg | ORAL_TABLET | Freq: Two times a day (BID) | ORAL | Status: DC
  Administered 2020-07-22 – 2020-07-23 (×2): 200 mg via ORAL
  Filled 2020-07-22 (×2): qty 1

## 2020-07-22 MED ORDER — VITAMIN B-12 1000 MCG PO TABS
500.0000 ug | ORAL_TABLET | Freq: Two times a day (BID) | ORAL | Status: DC
  Administered 2020-07-22 – 2020-07-23 (×2): 500 ug via ORAL
  Filled 2020-07-22 (×2): qty 1

## 2020-07-22 MED ORDER — DIVALPROEX SODIUM 125 MG PO DR TAB
125.0000 mg | DELAYED_RELEASE_TABLET | Freq: Two times a day (BID) | ORAL | Status: DC
  Administered 2020-07-22 – 2020-07-23 (×2): 125 mg via ORAL
  Filled 2020-07-22 (×3): qty 1

## 2020-07-22 MED ORDER — OCUVITE-LUTEIN PO CAPS
1.0000 | ORAL_CAPSULE | Freq: Two times a day (BID) | ORAL | Status: DC
  Administered 2020-07-22 – 2020-07-23 (×2): 1 via ORAL
  Filled 2020-07-22 (×3): qty 1

## 2020-07-22 MED ORDER — SENNOSIDES-DOCUSATE SODIUM 8.6-50 MG PO TABS: 1.0000 | ORAL_TABLET | Freq: Every evening | ORAL | Status: DC | PRN

## 2020-07-22 MED ORDER — HYDRALAZINE HCL 20 MG/ML IJ SOLN: 5.0000 mg | INTRAMUSCULAR | Status: DC | PRN

## 2020-07-22 MED ORDER — ACETAMINOPHEN 325 MG PO TABS
650.0000 mg | ORAL_TABLET | ORAL | Status: DC | PRN
  Administered 2020-07-22: 650 mg via ORAL
  Filled 2020-07-22: qty 2

## 2020-07-22 MED ORDER — NITROGLYCERIN 0.4 MG SL SUBL: 0.4000 mg | SUBLINGUAL_TABLET | SUBLINGUAL | Status: DC | PRN

## 2020-07-22 MED ORDER — ONDANSETRON HCL 4 MG/2ML IJ SOLN: 4.0000 mg | Freq: Three times a day (TID) | INTRAMUSCULAR | Status: DC | PRN

## 2020-07-22 MED ORDER — ATORVASTATIN CALCIUM 20 MG PO TABS
40.0000 mg | ORAL_TABLET | Freq: Every evening | ORAL | Status: DC
  Administered 2020-07-22: 40 mg via ORAL
  Filled 2020-07-22: qty 2

## 2020-07-22 MED ORDER — VITAMIN D 25 MCG (1000 UNIT) PO TABS
4000.0000 [IU] | ORAL_TABLET | Freq: Every day | ORAL | Status: DC
  Administered 2020-07-23: 4000 [IU] via ORAL
  Filled 2020-07-22: qty 4

## 2020-07-22 MED ORDER — ACETAMINOPHEN 160 MG/5ML PO SOLN
650.0000 mg | ORAL | Status: DC | PRN
  Filled 2020-07-22: qty 20.3

## 2020-07-22 NOTE — Consult Note (Signed)
Neurology Consultation Reason for Consult: Stroke Referring Physician: Mora Bellman  CC: Confusion  History is obtained from: Patient, caregiver, chart review  HPI: Holly Rose is a 86 y.o. female who saw Dr. Manuella Ghazi on 10/26 with some concern for visual hallucinations and confusion that was relatively new.  In response to this, he ordered a head CT which was performed today and revealed a sizable subacute infarct in the right parietal region.  Due to the finding of a subacute infarct, the hospitalist were called and she was admitted for observation.  Speaking with her caregiver, the timing is a little difficult to pin down, she did have a fall about a week ago, but given the confusion predating this, I suspect that her last known well was actually prior to the increasing confusion.   LKW: At least prior to 10/26 tpa given?: no, outside of window    ROS: A 14 point ROS was performed and is negative except as noted in the HPI.   Past Medical History:  Diagnosis Date  . Allergy   . Anemia   . Arthritis   . Atrial fibrillation ([place]) 2004  . Breast cancer, left ([place]) 10/2017   Lumpectomy and rad tx's.   Marland Kitchen CAD (coronary artery disease)   . Complication of anesthesia    hard to wake up with general anesthesia  . Diabetes ([place])   . Dysrhythmia    A-fib  . Heart attack ([place]) 2008  . Heart murmur   . History of kidney stones   . History of sick sinus syndrome    s/p pacemaker placement  . Hyperlipidemia   . Macular degeneration   . Neuropathy   . Neuropathy   . Pacemaker 06/2010  . Personal history of radiation therapy 11/2017   LEFT lumpectomy   . Squamous cell skin cancer   . TIA (transient ischemic attack)      Family History  Problem Relation Age of Onset  . Stroke Mother   . Diabetes Mother   . Cancer Maternal Aunt        Breast Cancer  . Breast cancer Maternal Aunt   . Arthritis Maternal Grandmother   . Cancer Maternal Aunt        Lung Cancer  . Diabetes Son    . Cancer Son   . Kidney disease Neg Hx   . Bladder Cancer Neg Hx      Social History:  reports that she quit smoking about 51 years ago. She quit after 5.00 years of use. She has never used smokeless tobacco. She reports that she does not drink alcohol and does not use drugs.   Exam: Current vital signs: BP 139/85 (BP Location: Right Arm)   Pulse 80   Temp 97.9 F (36.6 C) (Oral)   Resp 18   Ht 5\' 2"  (1.575 m)   Wt 59.3 kg   LMP  (LMP Unknown)   SpO2 96%   BMI 23.92 kg/m  Vital signs in last 24 hours: Temp:  [97.9 F (36.6 C)] 97.9 F (36.6 C) (11/08 0947) Pulse Rate:  [80-87] 80 (11/08 1325) Resp:  [17-18] 18 (11/08 1325) BP: (110-139)/(52-95) 139/85 (11/08 1325) SpO2:  [96 %-99 %] 96 % (11/08 1325) Weight:  [59.3 kg] 59.3 kg (11/08 0948)   Physical Exam  Constitutional: Appears well-developed and well-nourished.  Psych: Affect appropriate to situation Eyes: No scleral injection HENT: No OP obstrucion MSK: no joint deformities.  Cardiovascular: Normal rate and regular rhythm.  Respiratory: Effort  normal, non-labored breathing GI: Soft.  No distension. There is no tenderness.  Skin: WDI  Neuro: Mental Status: Patient is awake, alert, oriented to person, place, month, year, and situation. Patient is able to give a clear and coherent history. No signs of aphasia or neglect Cranial Nerves: II: Visual Fields are full, she does not extinguish to simultaneous visual stimulation. Pupils are equal, round, and reactive to light.   III,IV, VI: EOMI without ptosis or diploplia.  V: Facial sensation is symmetric to temperature VII: Facial movement is symmetric.  VIII: hearing is intact to voice X: Uvula elevates symmetrically XI: Shoulder shrug is symmetric. XII: tongue is midline without atrophy or fasciculations.  Motor: Tone is normal. Bulk is normal. 5/5 strength was present in all four extremities.  Sensory: Sensation is symmetric to light touch and temperature  in the arms and legs.  She does not extinguish to simultaneous stimulation Cerebellar: FNF  intact bilaterally   I have reviewed labs in epic and the results pertinent to this consultation are: Cr 0.79  I have reviewed the images obtained:CT head - subacute R MCA stroke  Impression: 86 year old female with a history of atrial fibrillation not on anticoagulation who has had a subacute right MCA stroke.  The time of onset is slightly unclear, but given the size I would favor waiting about 2 weeks from time of onset.  We are likely getting close given that she saw Dr. Manuella Ghazi on 10/26.  She cannot get an MRI due to her pacemaker. She has not been on anticaogulation due to falls.  Recommendations: 1) CTA head neck 2) echo, a1c, lipids 3) ASA for now, likely anticoagulation in the long term 4) PT,OT,ST   Roland Rack, MD [facility] [phone]  If 7pm- 7am, please page neurology on call as listed in [place].

## 2020-07-22 NOTE — H&P (Signed)
History and Physical    Holly Rose YJE:[id number] DOB: [birth year] DOA: 07/22/2020  Referring MD/NP/PA:   PCP: Holly Pheasant, MD   Patient coming from:  The patient is coming from home.  At baseline, pt is partially dependent for most of ADL.        Chief Complaint: Intermittently confusion, hallucination, poor balance  HPI: Holly Rose is a 86 y.o. female with medical history significant of diabetes mellitus, hyperlipidemia, TIA, depression, ICD placement, CAD, myocardial infarction, breast cancer (s/p of radiation in the left lumpectomy), atrial fibrillation not on anticoagulants, anemia, GI bleeding, who presents with intermittent confusion, hallucination and poor balance.  Per the caregiver and her daughter-in-law, patient has been intermittently confused with hallucinations since August.  Patient also has a poor balance.  Her memory has been deteriorating.  Does not seem to have unilateral weakness or tenderness in extremities.  No facial droop or slurred speech.  Patient does not have chest pain, cough, shortness of breath.  No nausea, vomiting, diarrhea or abdominal pain.  No symptoms of UTI.  Per caregiver, patient fell about a week ago. Pt was seen by her neurologist, Dr. Manuella Ghazi on 10/26, and had CT-head scan which showed subacute infarct in the right parietal region.  ED Course: pt was found to have WBC 3.4, INR 1.2, PTT 31, pending COVID-19 PCR, electrolytes renal function okay, temperature normal, blood pressure 136/95, heart rate 83, RR 18, oxygen saturation 98% on room air.  Patient is placed on MedSurg bed for observation.  Dr. Leonel Ramsay of neurology is consulted.  Review of Systems:   General: no fevers, chills, no body weight gain, has fatigue HEENT: no blurry vision, hearing changes or sore throat Respiratory: no dyspnea, coughing, wheezing CV: no chest pain, no palpitations GI: no nausea, vomiting, abdominal pain, diarrhea, constipation GU: no dysuria, burning  on urination, increased urinary frequency, hematuria  Ext: no leg edema Neuro: no unilateral weakness, numbness, or tingling, no vision change or hearing loss. Has hallucination, confusion, fall, poor balance Skin: no rash, no skin tear. MSK: No muscle spasm, no deformity, no limitation of range of movement in spin Heme: No easy bruising.  Travel history: No recent long distant travel.  Allergy:  Allergies  Allergen Reactions  . Lyrica [Pregabalin] Swelling    Sleepy, does not eat  . Neurontin [Gabapentin] Nausea And Vomiting and Other (See Comments)    disoriented  . Bactrim [Sulfamethoxazole-Trimethoprim]     Made her dizziness    Past Medical History:  Diagnosis Date  . Allergy   . Anemia   . Arthritis   . Atrial fibrillation ([place]) 2004  . Breast cancer, left ([place]) 10/2017   Lumpectomy and rad tx's.   Marland Kitchen CAD (coronary artery disease)   . Complication of anesthesia    hard to wake up with general anesthesia  . Diabetes ([place])   . Dysrhythmia    A-fib  . Heart attack ([place]) 2008  . Heart murmur   . History of kidney stones   . History of sick sinus syndrome    s/p pacemaker placement  . Hyperlipidemia   . Macular degeneration   . Neuropathy   . Neuropathy   . Pacemaker 06/2010  . Personal history of radiation therapy 11/2017   LEFT lumpectomy   . Squamous cell skin cancer   . TIA (transient ischemic attack)     Past Surgical History:  Procedure Laterality Date  . ABDOMINAL HYSTERECTOMY  1960's  . APPENDECTOMY  1947  .  [place]  . BREAST BIOPSY Left 12/17/2016   SINGLE FRAGMENT OF ATYPICAL EPITHELIAL CELLS  . BREAST BIOPSY Left 04/08/2017   [facility]  . BREAST EXCISIONAL BIOPSY Left 01/18/1986   Benign microcalcifications, [employer].  Marland Kitchen BREAST LUMPECTOMY Left 05/26/2017   13 mm,T1c, N0; ER/ PR+, Her 2 neu negative, HIGH RISK by [facility] ;  Surgeon: [name], MD;  Location: [facility] ORS;  Service: General;  Laterality: Left;   . BREAST SURGERY  1988  . CARDIAC CATHETERIZATION  1989  . CATARACT EXTRACTION  1997 and 1998  . COLONOSCOPY WITH PROPOFOL N/A 09/12/2015   Procedure: COLONOSCOPY WITH PROPOFOL;  Surgeon: [name], MD;  Location[facility] ENDOSCOPY;  Service: Endoscopy;  Laterality: N/A;  . IMPLANTABLE CARDIOVERTER DEFIBRILLATOR (ICD) GENERATOR CHANGE Left 03/29/2019   Procedure: PACEMAKER CHANGE OUT;  Surgeon: [name], MD;  Location: [facility] ORS;  Service: Cardiovascular;  Laterality: Left;  . KIDNEY STONE SURGERY  2018  . MOHS SURGERY  2019   forehead  . pace maker  2008  . [place]  . TONSILLECTOMY AND ADENOIDECTOMY  1950"s    Social History:  reports that she quit smoking about 51 years ago. She quit after 5.00 years of use. She has never used smokeless tobacco. She reports that she does not drink alcohol and does not use drugs.  Family History:  Family History  Problem Relation Age of Onset  . Stroke Mother   . Diabetes Mother   . Cancer Maternal Aunt        Breast Cancer  . Breast cancer Maternal Aunt   . Arthritis Maternal Grandmother   . Cancer Maternal Aunt        Lung Cancer  . Diabetes Son   . Cancer Son   . Kidney disease Neg Hx   . Bladder Cancer Neg Hx      Prior to Admission medications   Medication Sig Start Date End Date Taking? Authorizing Provider  Aflibercept (EYLEA) 2 MG/0.05ML SOLN 1 each by Intravitreal route as directed.     [provider]  alendronate (FOSAMAX) 70 MG tablet Take 1 tablet (70 mg total) by mouth once a week. Take with a full glass of water on an empty stomach. 12/11/19   [name], MD  atorvastatin (LIPITOR) 40 MG tablet Take 1 tablet (40 mg total) by mouth daily. 04/18/20   Wieting, Richard, MD  BAYER ASPIRIN EC LOW DOSE 81 MG EC tablet Take by mouth. 04/23/20   [provider]  Biotin w/ Vitamins C & E (HAIR/SKIN/NAILS PO) Take 3 tablets by mouth daily. 2 in the am, 1 in the pm    [provider]  blood glucose meter kit and supplies Per patient please dispense One touch Ultra2 meter.Use meter and supplies three times a day to check blood sugar. ICD10: E11.40 07/25/15   Holly Pheasant, MD  Blood Glucose Monitoring Suppl (FREESTYLE LITE) DEVI 2 (two) times daily. 03/04/20   [provider]  Cholecalciferol (VITAMIN D) 50 MCG (2000 UT) CAPS Take 4,000 Units by mouth daily.    [provider]  clopidogrel (PLAVIX) 75 MG tablet Take 1 tablet (75 mg total) by mouth daily. 04/18/20   Loletha Grayer, MD  divalproex (DEPAKOTE) 125 MG DR tablet Take one tablet q hs for one week and then one tablet bid 07/05/20   Holly Pheasant, MD  furosemide (LASIX) 20 MG tablet Take 1 tablet (  20 mg total) by mouth 2 (two) times daily. 04/18/20   Wieting, Richard, MD  glucose blood test strip Use as instructed to check sugars twice daily. Dx E11.9 03/20/20   Holly Pheasant, MD  isosorbide mononitrate (IMDUR) 30 MG 24 hr tablet Take 1 tablet (30 mg total) by mouth daily. 04/18/20   Loletha Grayer, MD  Lancets (FREESTYLE) lancets 2 (two) times daily. 03/02/20   [provider]  Magnesium 200 MG TABS Take 200 mg by mouth 2 (two) times a day.    [provider]  meclizine (ANTIVERT) 12.5 MG tablet Take 1 tablet (12.5 mg total) by mouth 2 (two) times daily as needed for dizziness. 02/07/20   Holly Pheasant, MD  metFORMIN (GLUCOPHAGE-XR) 500 MG 24 hr tablet Take 1 tablet (500 mg total) by mouth 2 (two) times daily. 05/01/20   Holly Pheasant, MD  metoprolol tartrate (LOPRESSOR) 25 MG tablet Take 25 mg by mouth 2 (two) times daily. 06/21/20   [provider]  mirtazapine (REMERON) 7.5 MG tablet Take 1 tablet (7.5 mg total) by mouth at bedtime. 06/08/20   Holly Pheasant, MD  Multiple Vitamins-Minerals (PRESERVISION AREDS 2 PO) Take 1 tablet by mouth 2 (two) times daily.     [provider]  mupirocin cream (BACTROBAN) 2 % Apply 1 application topically 2 (two)  times daily. 07/10/20   Hyatt, Max T, DPM  mupirocin ointment (BACTROBAN) 2 % Apply 1 application topically 2 (two) times daily. 07/11/20   Hyatt, Max T, DPM  nitroGLYCERIN (NITROSTAT) 0.4 MG SL tablet SMARTSIG:1 Tablet(s) Sublingual PRN 04/26/20   [provider]  Polyethyl Glycol-Propyl Glycol (SYSTANE OP) Apply 1 drop to eye 2 (two) times daily.    [provider]  Probiotic Product (PROBIOTIC DAILY PO) Take by mouth.    [provider]  tamoxifen (NOLVADEX) 20 MG tablet Take 1 tablet by mouth once daily 03/08/20   [name], MD  vitamin B-12 (CYANOCOBALAMIN) 500 MCG tablet Take 500 mcg by mouth 2 (two) times a day.    [provider]    Physical Exam: Vitals:   07/22/20 1125 07/22/20 1325 07/22/20 1550 07/22/20 1652  BP: (!) 136/95 139/85 140/80 (!) 143/83  Pulse: 83 80 84 91  Resp: 18 18 16 18   Temp:   97.8 F (36.6 C) 98 F (36.7 C)  TempSrc:   Oral Oral  SpO2: 98% 96% 97% 95%  Weight:      Height:       General: Not in acute distress HEENT:       Eyes: PERRL, EOMI, no scleral icterus.       ENT: No discharge from the ears and nose, no pharynx injection, no tonsillar enlargement.        Neck: No JVD, no bruit, no mass felt. Heme: No neck lymph node enlargement. Cardiac: S1/S2, RRR, No murmurs, No gallops or rubs. Respiratory: No rales, wheezing, rhonchi or rubs. GI: Soft, nondistended, nontender, no rebound pain, no organomegaly, BS present. GU: No hematuria Ext: No pitting leg edema bilaterally. 1+DP/PT pulse bilaterally. Musculoskeletal: No joint deformities, No joint redness or warmth, no limitation of ROM in spin. Skin: No rashes.  Neuro: Alert, mildly confused, but is still oriented X3, cranial nerves II-XII grossly intact, moves all extremities normally.  Psych: Patient is not psychotic, no suicidal or hemocidal ideation.  Labs on Admission: I have personally reviewed following labs and imaging studies  CBC: Recent Labs   Lab 07/22/20 0953  WBC 3.4*  NEUTROABS 2.3  HGB 9.5*  HCT 29.8*  MCV 94.6  PLT 638   Basic Metabolic Panel: Recent Labs  Lab 07/22/20 0953  NA 139  K 3.6  CL 103  CO2 29  GLUCOSE 96  BUN 18  CREATININE 0.79  CALCIUM 9.2   GFR: Estimated Creatinine Clearance: 38.4 mL/min (by C-G formula based on SCr of 0.79 mg/dL). Liver Function Tests: Recent Labs  Lab 07/22/20 0953  AST 37  ALT 28  ALKPHOS 28*  BILITOT 0.9  PROT 6.9  ALBUMIN 3.2*   No results for input(s): LIPASE, AMYLASE in the last 168 hours. No results for input(s): AMMONIA in the last 168 hours. Coagulation Profile: Recent Labs  Lab 07/22/20 0953  INR 1.2   Cardiac Enzymes: No results for input(s): CKTOTAL, CKMB, CKMBINDEX, TROPONINI in the last 168 hours. BNP (last 3 results) No results for input(s): PROBNP in the last 8760 hours. HbA1C: No results for input(s): HGBA1C in the last 72 hours. CBG: No results for input(s): GLUCAP in the last 168 hours. Lipid Profile: No results for input(s): CHOL, HDL, LDLCALC, TRIG, CHOLHDL, LDLDIRECT in the last 72 hours. Thyroid Function Tests: No results for input(s): TSH, T4TOTAL, FREET4, T3FREE, THYROIDAB in the last 72 hours. Anemia Panel: No results for input(s): VITAMINB12, FOLATE, FERRITIN, TIBC, IRON, RETICCTPCT in the last 72 hours. Urine analysis:    Component Value Date/Time   COLORURINE Dark Yellow (A) 05/23/2020 1333   APPEARANCEUR Cloudy (A) 05/23/2020 1333   APPEARANCEUR Clear 06/07/2019 0940   LABSPEC 1.020 05/23/2020 1333   PHURINE 5.5 05/23/2020 1333   GLUCOSEU NEGATIVE 05/23/2020 1333   HGBUR NEGATIVE 05/23/2020 1333   BILIRUBINUR NEGATIVE 05/23/2020 1333   BILIRUBINUR Negative 06/07/2019 0940   KETONESUR NEGATIVE 05/23/2020 1333   PROTEINUR Trace (A) 06/07/2019 0940   PROTEINUR NEGATIVE 08/15/2018 1608   UROBILINOGEN 0.2 05/23/2020 1333   NITRITE POSITIVE (A) 05/23/2020 1333   LEUKOCYTESUR MODERATE (A) 05/23/2020 1333   Sepsis  Labs: $[id number]$ (procalcitonin:4,lacticidven:4) )No results found for this or any previous visit (from the past 240 hour(s)).   Radiological Exams on Admission: CT HEAD WO CONTRAST  Addendum Date: 07/22/2020   ADDENDUM REPORT: 07/22/2020 08:48 ADDENDUM: These results were called by telephone at the time of interpretation on 07/22/2020 at 8:38 am to provider [facility] , who verbally acknowledged these results. Electronically Signed   By: Primitivo Gauze M.D.   On: 07/22/2020 08:48   Result Date: 07/22/2020 CLINICAL DATA:  Fall, confusion and hallucinations. EXAM: CT HEAD WITHOUT CONTRAST TECHNIQUE: Contiguous axial images were obtained from the base of the skull through the vertex without intravenous contrast. COMPARISON:  04/15/2020 head CT and prior. FINDINGS: Brain: New subacute right MCA territory infarct. No intracranial hemorrhage. Chronic right occipital insult. Mild cerebral atrophy with ex vacuo dilatation. Chronic microvascular ischemic changes. No mass lesion. No midline shift, ventriculomegaly or extra-axial fluid collection. Vascular: No hyperdense vessel or unexpected calcification. Bilateral skull base atherosclerotic calcifications. Skull: Negative for fracture or focal lesion. Sinuses/Orbits: Normal orbits. Clear paranasal sinuses. No mastoid effusion. Other: None. IMPRESSION: New subacute right MCA territory infarct. Mild cerebral atrophy and chronic microvascular ischemic changes. Remote right occipital insult. Electronically Signed: By: Primitivo Gauze M.D. On: 07/22/2020 08:32     EKG: I have personally reviewed.  Sinus rhythm, QTC 378, low voltage, occasional PVC, nonspecific T wave change  Assessment/Plan Principal Problem:   Stroke [facility]) Active Problems:   CAD (coronary artery disease)   Paroxysmal A-fib ([facility])   Diabetes  mellitus with neuropathy ([place])   Hyperlipidemia   Iron deficiency anemia   Depression   Stroke [facility]): outpt CT-head showed subacute infarct  in the right parietal region.  Patient has an ICD, cannot do MRI of brain.  Dr. Leonel Ramsay of neurology is consulted.  -Placed on MedSurg bed for observation - pt is on Plavix and add ASA  -->will follow neurology's recommendations about anticoagulants - lipitor - fasting lipid panel and HbA1c  - 2D transthoracic echocardiography  - Follow-up neurology's recommendation about further imaging  - swallowing screen. If fails, will get SLP - PT/OT consult  Hx of CAD (coronary artery disease) -on ASA and lipitor -Imdur  Paroxysmal A-fib [facility]): Patient is not on anticoagulant, possibly due to history of GI bleeding -Continue metoprolol  Diabetes mellitus with neuropathy [facility]): Recent A1c 6.6, well controlled.  Patient taking Metformin at home -Sliding scale insulin  Hyperlipidemia -Lipitor  Iron deficiency anemia: Hemoglobin 9.5 (11.4 on 04/17/2020), no actively bleeding. -Follow-up with CBC  Depression -Continue home medications     DVT ppx: SQ Lovenox Code Status: DNR (per her daughter-in law pt has DNR paper at  Home) Family Communication:   Yes, patient's daughter-in law by phone Disposition Plan:  Anticipate discharge back to previous environment Consults called:   Dr. Leonel Ramsay of neurology Admission status: Med-surg bed for obs   Status is: Observation  The patient remains OBS appropriate and will d/c before 2 midnights.  Dispo: The patient is from: Home              Anticipated d/c is to: Home              Anticipated d/c date is: 1 day              Patient currently is not medically stable to d/c.          Date of Service 07/22/2020    Ivor Costa [facility]   If 7PM-7AM, please contact night-coverage [email or web address] 07/22/2020, 5:57 PM

## 2020-07-22 NOTE — ED Notes (Signed)
Patient sent by Dr. Manuella Ghazi for old stroke seen on CT scan today. At this time, patient has no deficits or complaints.

## 2020-07-22 NOTE — ED Provider Notes (Signed)
[facility] Emergency Department Provider Note  ____________________________________________  Time seen: Approximately 12:07 PM  I have reviewed the triage vital signs and the nursing notes.   HISTORY  Chief Complaint Cerebrovascular Accident    HPI Holly Rose is a 86 y.o. female with a history of atrial fibrillation CAD diabetes sick sinus syndrome status post pacemaker placement who was sent to the ED today for evaluation.  She had a fall about a week ago, after which family felt like she was having some change in her mental status.  She had episodes of nighttime wandering within the house and worsening of confusion on top of her chronic dementia.  She was seen by neurology who also had the sense that she had undergone some sort of mental status change.  They had an outpatient CT scan done today which showed a subacute right MCA infarct.  Patient was told to come to the ED for further evaluation.  I discussed the presentation with Dr. Manuella Ghazi who requested hospitalization for expedited stroke work-up.   Patient has been ambulating at home with walker as per her baseline.  Denies weakness or paresthesia, denies change in eating balance or coordination.   Past Medical History:  Diagnosis Date  . Allergy   . Anemia   . Arthritis   . Atrial fibrillation ([place]) 2004  . Breast cancer, left ([place]) 10/2017   Lumpectomy and rad tx's.   Marland Kitchen CAD (coronary artery disease)   . Complication of anesthesia    hard to wake up with general anesthesia  . Diabetes ([place])   . Dysrhythmia    A-fib  . Heart attack ([place]) 2008  . Heart murmur   . History of kidney stones   . History of sick sinus syndrome    s/p pacemaker placement  . Hyperlipidemia   . Macular degeneration   . Neuropathy   . Neuropathy   . Pacemaker 06/2010  . Personal history of radiation therapy 11/2017   LEFT lumpectomy   . Squamous cell skin cancer   . TIA (transient ischemic attack)       Patient Active Problem List   Diagnosis Date Noted  . Visual hallucinations 07/21/2020  . NSTEMI (non-ST elevated myocardial infarction) ([place]) 04/17/2020  . Atrial fibrillation with RVR ([place]) 04/17/2020  . Change in mental status 04/15/2020  . Head injury 04/15/2020  . Dysuria 04/30/2019  . Frequent urinary tract infections 04/30/2019  . Age-related osteoporosis without current pathological fracture 03/01/2019  . Dizziness 08/29/2018  . UTI (urinary tract infection) 08/15/2018  . Vitamin D deficiency, unspecified 06/16/2017  . Hypercalcinuria 06/14/2017  . SCC (squamous cell carcinoma), face 03/29/2017  . History of kidney stones 03/12/2017  . Malignant neoplasm of left female breast ([place]) 12/28/2016  . Chronic cystitis 11/05/2016  . Nephrolithiasis 11/05/2016  . Renal colic 96/22/2979  . Urge incontinence 11/05/2016  . Bilateral hand pain 11/03/2016  . Generalized osteoarthritis of hand 11/03/2016  . Numbness and tingling in both hands 11/03/2016  . Bradycardia 04/15/2016  . History of colonic polyps 09/16/2015  . Abdominal pain 08/18/2015  . Rectal bleeding 08/18/2015  . CAD (coronary artery disease) 02/17/2015  . Paroxysmal A-fib ([place]) 02/17/2015  . Diabetes mellitus with neuropathy ([place]) 02/17/2015  . Hyperlipidemia 02/17/2015  . Iron deficiency anemia 02/17/2015  . Macular degeneration 02/17/2015  . Stress 02/17/2015  . Health care maintenance 02/17/2015  . Diarrhea 02/17/2015  . Type II diabetes mellitus ([place]) 07/13/2014  . Diverticulosis 03/31/2013  . GI  bleeding 03/31/2013  . Mitral valve prolapse 03/31/2013  . Diverticulosis of intestine without perforation or abscess without bleeding 03/31/2013     Past Surgical History:  Procedure Laterality Date  . ABDOMINAL HYSTERECTOMY  1960's  . APPENDECTOMY  1947  . [place]  . BREAST BIOPSY Left 12/17/2016   SINGLE FRAGMENT OF ATYPICAL EPITHELIAL CELLS  . [facility]  [facility]08/2017   [facility]  . BREAST EXCISIONAL BIOPSY Left 01/18/1986   Benign microcalcifications, [employer].  Marland Kitchen BREAST LUMPECTOMY Left 05/26/2017   13 mm,T1c, N0; ER/ PR+, Her 2 neu negative, HIGH RISK by Mammoprint ;  Surgeon: [name], MD;  Location: [facility] ORS;  Service: General;  Laterality: Left;  . BREAST SURGERY  1988  . CARDIAC CATHETERIZATION  1989  . CATARACT EXTRACTION  1997 and 1998  . COLONOSCOPY WITH PROPOFOL N/A 09/12/2015   Procedure: COLONOSCOPY WITH PROPOFOL;  Surgeon: [name], MD;  Location[facility] ENDOSCOPY;  Service: Endoscopy;  Laterality: N/A;  . IMPLANTABLE CARDIOVERTER DEFIBRILLATOR (ICD) GENERATOR CHANGE Left 03/29/2019   Procedure: PACEMAKER CHANGE OUT;  Surgeon: [name], MD;  Location: [facility] ORS;  Service: Cardiovascular;  Laterality: Left;  . KIDNEY STONE SURGERY  2018  . MOHS SURGERY  2019   forehead  . pace maker  2008  . [place]  . TONSILLECTOMY AND ADENOIDECTOMY  1950"s     Prior to Admission medications   Medication Sig Start Date End Date Taking? Authorizing Provider  Aflibercept (EYLEA) 2 MG/0.05ML SOLN 1 each by Intravitreal route as directed.     [provider]  alendronate (FOSAMAX) 70 MG tablet Take 1 tablet (70 mg total) by mouth once a week. Take with a full glass of water on an empty stomach. 12/11/19   [name], MD  atorvastatin (LIPITOR) 40 MG tablet Take 1 tablet (40 mg total) by mouth daily. 04/18/20   Wieting, Richard, MD  BAYER ASPIRIN EC LOW DOSE 81 MG EC tablet Take by mouth. 04/23/20   [provider]  Biotin w/ Vitamins C & E (HAIR/SKIN/NAILS PO) Take 3 tablets by mouth daily. 2 in the am, 1 in the pm    [provider]  blood glucose meter kit and supplies Per patient please dispense One touch Ultra2 meter.Use meter and supplies three times a day to check blood sugar. ICD10: E11.40 07/25/15   [name], MD  Blood Glucose Monitoring Suppl (FREESTYLE LITE)  DEVI 2 (two) times daily. 03/04/20   [provider]  Cholecalciferol (VITAMIN D) 50 MCG (2000 UT) CAPS Take 4,000 Units by mouth daily.    [provider]  clopidogrel (PLAVIX) 75 MG tablet Take 1 tablet (75 mg total) by mouth daily. 04/18/20   Loletha Grayer, MD  divalproex (DEPAKOTE) 125 MG DR tablet Take one tablet q hs for one week and then one tablet bid 07/05/20   [name], MD  furosemide (LASIX) 20 MG tablet Take 1 tablet (20 mg total) by mouth 2 (two) times daily. 04/18/20   Wieting, Richard, MD  glucose blood test strip Use as instructed to check sugars twice daily. Dx E11.9 03/20/20   [name], MD  isosorbide mononitrate (IMDUR) 30 MG 24 hr tablet Take 1 tablet (30 mg total) by mouth daily. 04/18/20   Loletha Grayer, MD  Lancets (FREESTYLE) lancets 2 (two) times daily. 03/02/20   [provider]  Magnesium 200 MG TABS Take 200 mg by mouth  2 (two) times a day.    [provider]  meclizine (ANTIVERT) 12.5 MG tablet Take 1 tablet (12.5 mg total) by mouth 2 (two) times daily as needed for dizziness. 02/07/20   [name], MD  metFORMIN (GLUCOPHAGE-XR) 500 MG 24 hr tablet Take 1 tablet (500 mg total) by mouth 2 (two) times daily. 05/01/20   [name], MD  metoprolol tartrate (LOPRESSOR) 25 MG tablet Take 25 mg by mouth 2 (two) times daily. 06/21/20   [provider]  mirtazapine (REMERON) 7.5 MG tablet Take 1 tablet (7.5 mg total) by mouth at bedtime. 06/08/20   [name], MD  Multiple Vitamins-Minerals (PRESERVISION AREDS 2 PO) Take 1 tablet by mouth 2 (two) times daily.     [provider]  mupirocin cream (BACTROBAN) 2 % Apply 1 application topically 2 (two) times daily. 07/10/20   Hyatt, Max T, DPM  mupirocin ointment (BACTROBAN) 2 % Apply 1 application topically 2 (two) times daily. 07/11/20   Hyatt, Max T, DPM  nitroGLYCERIN (NITROSTAT) 0.4 MG SL tablet SMARTSIG:1 Tablet(s) Sublingual PRN 04/26/20   [provider]  Polyethyl Glycol-Propyl Glycol (SYSTANE OP) Apply 1 drop to eye 2 (two) times daily.    [provider]  Probiotic Product (PROBIOTIC DAILY PO) Take by mouth.    [provider]  tamoxifen (NOLVADEX) 20 MG tablet Take 1 tablet by mouth once daily 03/08/20   [name], MD  vitamin B-12 (CYANOCOBALAMIN) 500 MCG tablet Take 500 mcg by mouth 2 (two) times a day.    [provider]     Allergies Lyrica [pregabalin], Neurontin [gabapentin], and Bactrim [sulfamethoxazole-trimethoprim]   Family History  Problem Relation Age of Onset  . Stroke Mother   . Diabetes Mother   . Cancer Maternal Aunt        Breast Cancer  . Breast cancer Maternal Aunt   . Arthritis Maternal Grandmother   . Cancer Maternal Aunt        Lung Cancer  . Diabetes Son   . Cancer Son   . Kidney disease Neg Hx   . Bladder Cancer Neg Hx     Social History Social History   Tobacco Use  . Smoking status: Former Smoker    Years: 5.00    Quit date: 09/14/1968    Years since quitting: 51.8  . Smokeless tobacco: Never Used  . Tobacco comment: quit 1970  Vaping Use  . Vaping Use: Never used  Substance Use Topics  . Alcohol use: No    Alcohol/week: 0.0 standard drinks  . Drug use: No    Review of Systems  Constitutional:   No fever or chills.  ENT:   No sore throat. No rhinorrhea. Cardiovascular:   No chest pain or syncope. Respiratory:   No dyspnea or cough. Gastrointestinal:   Negative for abdominal pain, vomiting and diarrhea.  Musculoskeletal:   Negative for focal pain or swelling All other systems reviewed and are negative except as documented above in ROS and HPI.  ____________________________________________   PHYSICAL EXAM:  VITAL SIGNS: ED Triage Vitals  Enc Vitals Group     BP 07/22/20 0947 (!) 110/52     Pulse Rate 07/22/20 0947 87     Resp 07/22/20 0947 17     Temp 07/22/20 0947 97.9 F (36.6 C)     Temp Source 07/22/20 0947 Oral      SpO2 07/22/20 0947 99 %     Weight 07/22/20 0948 130 lb 12.8  oz (59.3 kg)     Height 07/22/20 0948 5' 2"  (1.575 m)     Head Circumference --      Peak Flow --      Pain Score 07/22/20 0948 0     Pain Loc --      Pain Edu? --      Excl. in [place]? --     Vital signs reviewed, nursing assessments reviewed.   Constitutional:   Alert and oriented to person and place. Non-toxic appearance. Eyes:   Conjunctivae are normal. EOMI. PERRL. ENT      Head:   Normocephalic and atraumatic.      Nose:   Wearing a mask.      Mouth/Throat:   Wearing a mask.      Neck:   No meningismus. Full ROM. Hematological/Lymphatic/Immunilogical:   No cervical lymphadenopathy. Cardiovascular:   Irregularly irregular rhythm. Symmetric bilateral radial and DP pulses.  No murmurs. Cap refill less than 2 seconds. Respiratory:   Normal respiratory effort without tachypnea/retractions. Breath sounds are clear and equal bilaterally. No wheezes/rales/rhonchi. Gastrointestinal:   Soft and nontender. Non distended. There is no CVA tenderness.  No rebound, rigidity, or guarding.  Musculoskeletal:   Normal range of motion in all extremities. No joint effusions.  No lower extremity tenderness.  No edema. Neurologic:   Normal speech and language.  Cranial nerves II through XII intact Motor grossly intact.  Symmetric Intact sensation Normal to finger-nose, no pronator drift, normal rapid successive movements.  Normal cerebellar function. No acute focal neurologic deficits are appreciated.  NIH stroke scale 0 Skin:    Skin is warm, dry and intact. No rash noted.  No petechiae, purpura, or bullae.  ____________________________________________    LABS (pertinent positives/negatives) (all labs ordered are listed, but only abnormal results are displayed) Labs Reviewed  CBC - Abnormal; Notable for the following components:      Result Value   WBC 3.4 (*)    RBC 3.15 (*)    Hemoglobin 9.5 (*)    HCT 29.8 (*)    RDW 16.6  (*)    All other components within normal limits  COMPREHENSIVE METABOLIC PANEL - Abnormal; Notable for the following components:   Albumin 3.2 (*)    Alkaline Phosphatase 28 (*)    All other components within normal limits  RESPIRATORY PANEL BY RT PCR (FLU A&B, COVID)  PROTIME-INR  APTT  DIFFERENTIAL  CBG MONITORING, ED   ____________________________________________   EKG  Interpreted by me Atrial fibrillation rate of 81, normal axis and intervals.  Poor R wave progression.  Normal ST segments and T waves.  1 PVC on the strip.  ____________________________________________    RADIOLOGY  CT HEAD WO CONTRAST  Addendum Date: 07/22/2020   ADDENDUM REPORT: 07/22/2020 08:48 ADDENDUM: These results were called by telephone at the time of interpretation on 07/22/2020 at 8:38 am to provider [facility] , who verbally acknowledged these results. Electronically Signed   By: Primitivo Gauze M.D.   On: 07/22/2020 08:48   Result Date: 07/22/2020 CLINICAL DATA:  Fall, confusion and hallucinations. EXAM: CT HEAD WITHOUT CONTRAST TECHNIQUE: Contiguous axial images were obtained from the base of the skull through the vertex without intravenous contrast. COMPARISON:  04/15/2020 head CT and prior. FINDINGS: Brain: New subacute right MCA territory infarct. No intracranial hemorrhage. Chronic right occipital insult. Mild cerebral atrophy with ex vacuo dilatation. Chronic microvascular ischemic changes. No mass lesion. No midline shift, ventriculomegaly or extra-axial fluid collection. Vascular: No hyperdense  vessel or unexpected calcification. Bilateral skull base atherosclerotic calcifications. Skull: Negative for fracture or focal lesion. Sinuses/Orbits: Normal orbits. Clear paranasal sinuses. No mastoid effusion. Other: None. IMPRESSION: New subacute right MCA territory infarct. Mild cerebral atrophy and chronic microvascular ischemic changes. Remote right occipital insult. Electronically Signed: By:  Primitivo Gauze M.D. On: 07/22/2020 08:32    ____________________________________________   PROCEDURES Procedures  ____________________________________________    CLINICAL IMPRESSION / ASSESSMENT AND PLAN / ED COURSE  Medications ordered in the ED: Medications  sodium chloride flush (NS) 0.9 % injection 3 mL (has no administration in time range)    Pertinent labs & imaging results that were available during my care of the patient were reviewed by me and considered in my medical decision making (see chart for details).  Holly Rose was evaluated in Emergency Department on 07/22/2020 for the symptoms described in the history of present illness. She was evaluated in the context of the global COVID-19 pandemic, which necessitated consideration that the patient might be at risk for infection with the SARS-CoV-2 virus that causes COVID-19. Institutional protocols and algorithms that pertain to the evaluation of patients at risk for COVID-19 are in a state of rapid change based on information released by regulatory bodies including the CDC and federal and state organizations. These policies and algorithms were followed during the patient's care in the ED.   Patient presents to the ED for evaluation after outpatient CT showing subacute right MCA infarct.  Discussed with her neurologist regarding the absence of any hard stroke symptoms, he would still like to pursue a stroke work-up in the hospital to expedite work-up.  We will do a swallow screen and allow the patient to eat.  Case discussed with the hospitalist for further evaluation.  She is unable to have MRI due to pacemaker.      ____________________________________________   FINAL CLINICAL IMPRESSION(S) / ED DIAGNOSES    Final diagnoses:  Cerebrovascular accident (CVA), unspecified mechanism ([place])  Dementia without behavioral disturbance, unspecified dementia type [facility])     ED Discharge Orders    None       Portions of this note were generated with dragon dictation software. Dictation errors may occur despite best attempts at proofreading.   Carrie Mew, MD 07/22/20 1221

## 2020-07-22 NOTE — ED Triage Notes (Signed)
Pt was seen by Dr. Manuella Ghazi last week and went for a scheduled head CT this morning, due to memory loss, balance issues. Pt is her with friend/caregiver.

## 2020-07-23 ENCOUNTER — Encounter: Payer: Self-pay | Admitting: Internal Medicine

## 2020-07-23 ENCOUNTER — Telehealth: Payer: Self-pay | Admitting: Internal Medicine

## 2020-07-23 ENCOUNTER — Ambulatory Visit: Payer: Medicare Other | Admitting: Oncology

## 2020-07-23 DIAGNOSIS — E1140 Type 2 diabetes mellitus with diabetic neuropathy, unspecified: Secondary | ICD-10-CM

## 2020-07-23 DIAGNOSIS — F0390 Unspecified dementia without behavioral disturbance: Secondary | ICD-10-CM

## 2020-07-23 DIAGNOSIS — I639 Cerebral infarction, unspecified: Secondary | ICD-10-CM | POA: Diagnosis not present

## 2020-07-23 LAB — IRON AND TIBC
Iron: 32 ug/dL (ref 28–170)
Saturation Ratios: 11 % (ref 10.4–31.8)
TIBC: 280 ug/dL (ref 250–450)
UIBC: 248 ug/dL

## 2020-07-23 LAB — LIPID PANEL
Cholesterol: 99 mg/dL (ref 0–200)
HDL: 41 mg/dL (ref >40)
LDL Cholesterol: 46 mg/dL (ref 0–99)
Total CHOL/HDL Ratio: 2.4 RATIO
Triglycerides: 60 mg/dL (ref <150)
VLDL: 12 mg/dL (ref 0–40)

## 2020-07-23 LAB — GLUCOSE, CAPILLARY
Glucose-Capillary: 98 mg/dL (ref 70–99)
Glucose-Capillary: 188 mg/dL — ABNORMAL HIGH (ref 70–99)

## 2020-07-23 LAB — RETICULOCYTES
Immature Retic Fract: 22.9 % — ABNORMAL HIGH (ref 2.3–15.9)
RBC.: 3.22 MIL/uL — ABNORMAL LOW (ref 3.87–5.11)
Retic Count, Absolute: 59.2 10*3/uL (ref 19.0–186.0)
Retic Ct Pct: 1.8 % (ref 0.4–3.1)

## 2020-07-23 LAB — VITAMIN B12: Vitamin B-12: 1326 pg/mL — ABNORMAL HIGH (ref 180–914)

## 2020-07-23 LAB — FOLATE: Folate: 28 ng/mL (ref >5.9)

## 2020-07-23 LAB — HEMOGLOBIN A1C
Hgb A1c MFr Bld: 6.2 % — ABNORMAL HIGH (ref 4.8–5.6)
Mean Plasma Glucose: 131.24 mg/dL

## 2020-07-23 LAB — FERRITIN: Ferritin: 22 ng/mL (ref 11–307)

## 2020-07-23 MED ORDER — APIXABAN 2.5 MG PO TABS: 2.5000 mg | ORAL_TABLET | Freq: Two times a day (BID) | ORAL | Status: DC

## 2020-07-23 MED ORDER — APIXABAN 2.5 MG PO TABS: 2.5000 mg | ORAL_TABLET | Freq: Two times a day (BID) | ORAL | 1 refills | Status: AC

## 2020-07-23 NOTE — Progress Notes (Signed)
Order placed for 1:1 sitter with sitter now in room. Report given to sitter, will continue to monitor patient closely.

## 2020-07-23 NOTE — Telephone Encounter (Signed)
Any 12:00 slot is fine if no other availability. Does she need TCM

## 2020-07-23 NOTE — Evaluation (Signed)
Clinical/Bedside Swallow Evaluation Patient Details  Name: Holly Rose MRN: [id number] Date of Birth: Aug 10, 1930  Today's Date: 07/23/2020 Time: SLP Start Time (ACUTE ONLY): 0830 SLP Stop Time (ACUTE ONLY): 0900 SLP Time Calculation (min) (ACUTE ONLY): 30 min  Past Medical History:  Past Medical History:  Diagnosis Date  . Allergy   . Anemia   . Arthritis   . Atrial fibrillation ([place]) 2004  . Breast cancer, left ([place]) 10/2017   Lumpectomy and rad tx's.   Marland Kitchen CAD (coronary artery disease)   . Complication of anesthesia    hard to wake up with general anesthesia  . Diabetes ([place])   . Dysrhythmia    A-fib  . Heart attack ([place]) 2008  . Heart murmur   . History of kidney stones   . History of sick sinus syndrome    s/p pacemaker placement  . Hyperlipidemia   . Macular degeneration   . Neuropathy   . Neuropathy   . Pacemaker 06/2010  . Personal history of radiation therapy 11/2017   LEFT lumpectomy   . Squamous cell skin cancer   . TIA (transient ischemic attack)    Past Surgical History:  Past Surgical History:  Procedure Laterality Date  . ABDOMINAL HYSTERECTOMY  1960's  . APPENDECTOMY  1947  . [place]  . BREAST BIOPSY Left 12/17/2016   SINGLE FRAGMENT OF ATYPICAL EPITHELIAL CELLS  . [facility]2017   [facility]  . BREAST EXCISIONAL BIOPSY Left 01/18/1986   Benign microcalcifications, [employer].  Marland Kitchen BREAST LUMPECTOMY Left 05/26/2017   13 mm,T1c, N0; ER/ PR+, Her 2 neu negative, HIGH RISK by Mammoprint ;  Surgeon: [name], MD;  Location: [facility] ORS;  Service: General;  Laterality: Left;  . BREAST SURGERY  1988  . CARDIAC CATHETERIZATION  1989  . CATARACT EXTRACTION  1997 and 1998  . COLONOSCOPY WITH PROPOFOL N/A 09/12/2015   Procedure: COLONOSCOPY WITH PROPOFOL;  Surgeon: [name], MD;  Location: [facility] ENDOSCOPY;  Service: Endoscopy;  Laterality: N/A;  . IMPLANTABLE CARDIOVERTER DEFIBRILLATOR (ICD) GENERATOR  CHANGE Left 03/29/2019   Procedure: PACEMAKER CHANGE OUT;  Surgeon: [name], MD;  Location: [facility] ORS;  Service: Cardiovascular;  Laterality: Left;  . KIDNEY STONE SURGERY  2018  . MOHS SURGERY  2019   forehead  . pace maker  2008  . [place]  . TONSILLECTOMY AND ADENOIDECTOMY  1950"s   HPI:  Pt presented to ER secondary to AMS, hallucinations and decreased balance; admitted for management of sub-acute CVA (R MCA infarct)   Assessment / Plan / Recommendation Clinical Impression  Pt presents with adequate oropharyngeal abilities when consuming regular diet with thin liquids via cup and straw. Pt demonstrated efficient oral phase when consuming regular breakfast items including bacon and when consuming thin liquids via cup, pt's swallow appeared swift with no overt s/s of aspiration present. At this time pt is at reduce risk of aspiration with current diet. ST intervention is not indicated.  SLP Visit Diagnosis: Dysphagia, unspecified (R13.10)    Aspiration Risk  No limitations    Diet Recommendation Regular;Thin liquid   Liquid Administration via: Cup;Straw Medication Administration: Whole meds with liquid Supervision: Patient able to self feed Compensations: Minimize environmental distractions;Slow rate;Small sips/bites Postural Changes: Seated upright at 90 degrees    Other  Recommendations Oral Care Recommendations: Oral care BID   Follow up Recommendations 24 hour supervision/assistance      Frequency  and Duration            Prognosis        Swallow Study   General Date of Onset: 07/22/20 HPI: Pt presented to ER secondary to AMS, hallucinations and decreased balance; admitted for management of sub-acute CVA (R MCA infarct) Type of Study: Bedside Swallow Evaluation Previous Swallow Assessment: none in chart Diet Prior to this Study: Regular;Thin liquids Temperature Spikes Noted: No Respiratory Status: Room air History of Recent Intubation:  No Behavior/Cognition: Alert;Cooperative;Pleasant mood Oral Cavity Assessment: Within Functional Limits Oral Care Completed by SLP: No Oral Cavity - Dentition: Adequate natural dentition Vision: Functional for self-feeding Self-Feeding Abilities: Able to feed self Patient Positioning: Upright in bed Baseline Vocal Quality: Normal Volitional Cough: Strong Volitional Swallow: Able to elicit    Oral/Motor/Sensory Function Overall Oral Motor/Sensory Function: Within functional limits   Ice Chips Ice chips: Not tested   Thin Liquid Thin Liquid: Within functional limits Presentation: Self Fed;Cup;Straw    Nectar Thick Nectar Thick Liquid: Not tested   Honey Thick Honey Thick Liquid: Not tested   Puree Puree: Within functional limits Presentation: Self Fed;Spoon   Solid     Solid: Within functional limits Presentation: Self Fed;Spoon     Holly Rose M.S., CCC-SLP, [place] Office [phone]  Holly Rose [place] Rose 07/23/2020,1:29 PM

## 2020-07-23 NOTE — Evaluation (Signed)
Speech Language Pathology Evaluation Patient Details Name: Holly Rose MRN: [id number] DOB: 05/15/1930 Today's Date: 07/23/2020 Time: 2725-3664 SLP Time Calculation (min) (ACUTE ONLY): 15 min  Problem List:  Patient Active Problem List   Diagnosis Date Noted  . Dementia without behavioral disturbance ([place])   . Stroke ([place]) 07/22/2020  . TIA (transient ischemic attack) 07/22/2020  . Depression 07/22/2020  . Visual hallucinations 07/21/2020  . NSTEMI (non-ST elevated myocardial infarction) ([place]) 04/17/2020  . Atrial fibrillation with RVR ([place]) 04/17/2020  . Change in mental status 04/15/2020  . Head injury 04/15/2020  . Dysuria 04/30/2019  . Frequent urinary tract infections 04/30/2019  . Age-related osteoporosis without current pathological fracture 03/01/2019  . Dizziness 08/29/2018  . UTI (urinary tract infection) 08/15/2018  . Vitamin D deficiency, unspecified 06/16/2017  . Hypercalcinuria 06/14/2017  . SCC (squamous cell carcinoma), face 03/29/2017  . History of kidney stones 03/12/2017  . Malignant neoplasm of left female breast ([place]) 12/28/2016  . Chronic cystitis 11/05/2016  . Nephrolithiasis 11/05/2016  . Renal colic 40/34/7425  . Urge incontinence 11/05/2016  . Bilateral hand pain 11/03/2016  . Generalized osteoarthritis of hand 11/03/2016  . Numbness and tingling in both hands 11/03/2016  . Bradycardia 04/15/2016  . History of colonic polyps 09/16/2015  . Abdominal pain 08/18/2015  . Rectal bleeding 08/18/2015  . CAD (coronary artery disease) 02/17/2015  . Paroxysmal A-fib ([place]) 02/17/2015  . Diabetes mellitus with neuropathy ([place]) 02/17/2015  . Hyperlipidemia 02/17/2015  . Iron deficiency anemia 02/17/2015  . Macular degeneration 02/17/2015  . Stress 02/17/2015  . Health care maintenance 02/17/2015  . Diarrhea 02/17/2015  . Type II diabetes mellitus ([place]) 07/13/2014  . Diverticulosis 03/31/2013  . GI bleeding 03/31/2013  . Mitral valve prolapse  03/31/2013  . Diverticulosis of intestine without perforation or abscess without bleeding 03/31/2013   Past Medical History:  Past Medical History:  Diagnosis Date  . Allergy   . Anemia   . Arthritis   . Atrial fibrillation ([place]) 2004  . Breast cancer, left ([place]) 10/2017   Lumpectomy and rad tx's.   Marland Kitchen CAD (coronary artery disease)   . Complication of anesthesia    hard to wake up with general anesthesia  . Diabetes ([place])   . Dysrhythmia    A-fib  . Heart attack ([place]) 2008  . Heart murmur   . History of kidney stones   . History of sick sinus syndrome    s/p pacemaker placement  . Hyperlipidemia   . Macular degeneration   . Neuropathy   . Neuropathy   . Pacemaker 06/2010  . Personal history of radiation therapy 11/2017   LEFT lumpectomy   . Squamous cell skin cancer   . TIA (transient ischemic attack)    Past Surgical History:  Past Surgical History:  Procedure Laterality Date  . ABDOMINAL HYSTERECTOMY  1960's  . APPENDECTOMY  1947  . [place]  . BREAST BIOPSY Left 12/17/2016   SINGLE FRAGMENT OF ATYPICAL EPITHELIAL CELLS  . BREAST BIOPSY Left 04/08/2017   [facility]  . BREAST EXCISIONAL BIOPSY Left 01/18/1986   Benign microcalcifications, [employer].  Marland Kitchen BREAST LUMPECTOMY Left 05/26/2017   13 mm,T1c, N0; ER/ PR+, Her 2 neu negative, HIGH RISK by [place] ;  Surgeon: [name], MD;  Location: [facility] ORS;  Service: General;  Laterality: Left;  . BREAST SURGERY  1988  . CARDIAC CATHETERIZATION  1989  . CATARACT EXTRACTION  1997 and 1998  .  COLONOSCOPY WITH PROPOFOL N/A 09/12/2015   Procedure: COLONOSCOPY WITH PROPOFOL;  Surgeon: [name], MD;  Location: [facility] ENDOSCOPY;  Service: Endoscopy;  Laterality: N/A;  . IMPLANTABLE CARDIOVERTER DEFIBRILLATOR (ICD) GENERATOR CHANGE Left 03/29/2019   Procedure: PACEMAKER CHANGE OUT;  Surgeon: [name], MD;  Location: [facility] ORS;  Service: Cardiovascular;  Laterality: Left;  .  KIDNEY STONE SURGERY  2018  . MOHS SURGERY  2019   forehead  . pace maker  2008  . [place]  . TONSILLECTOMY AND ADENOIDECTOMY  1950"s   HPI:  Pt presented to ER secondary to AMS, hallucinations and decreased balance; admitted for management of sub-acute CVA (R MCA infarct)   Assessment / Plan / Recommendation Clinical Impression  Pt presents with intact basic cognitive linguistic abilities that are c/b ability to recall orientation information, problem solve basic tasks, good selective attention and overall improved safety awareness. Pt does describe visual hallucinations of children waiting at her door (overnight). Concern that pt may act on these hallucinations without good safety awareness. Skilled ST is not indicated as pt's cognitive abilities are functional.     SLP Assessment  SLP Recommendation/Assessment: Patient does not need any further Speech Lanaguage Pathology Services SLP Visit Diagnosis: Cognitive communication deficit (R41.841)    Follow Up Recommendations  24 hour supervision/assistance    Frequency and Duration   N/A        SLP Evaluation Cognition  Overall Cognitive Status: Within Functional Limits for tasks assessed Arousal/Alertness: Awake/alert Orientation Level: Oriented X4 Attention: Selective Selective Attention: Appears intact Memory: Appears intact Problem Solving: Appears intact Comments: Pt with visual hallucinations at night and might act on hallunications without intent       Comprehension  Auditory Comprehension Overall Auditory Comprehension: Appears within functional limits for tasks assessed Visual Recognition/Discrimination Discrimination: Within Function Limits Reading Comprehension Reading Status: Not tested    Expression Expression Primary Mode of Expression: Verbal Verbal Expression Overall Verbal Expression: Appears within functional limits for tasks assessed Written Expression Dominant Hand: Right Written  Expression: Not tested   Oral / Motor  Oral Motor/Sensory Function Overall Oral Motor/Sensory Function: Within functional limits Motor Speech Overall Motor Speech: Appears within functional limits for tasks assessed   GO                   Ardie Dragoo B. [name] M.S., CCC-SLP, [place] Office [phone]  Consetta Cosner [name] 07/23/2020, 1:24 PM

## 2020-07-23 NOTE — Progress Notes (Signed)
Patient discharged to home with spouse via w/c with volunteer.  Daughter, [name], called and discharge instructions given over the phone to her and also given to patient/spouse.

## 2020-07-23 NOTE — TOC Initial Note (Signed)
Transition of Care [facility]) - Initial/Assessment Note    Patient Details  Name: Holly Rose MRN: [id number] Date of Birth: [birth year]  [facility]) CM/SW Contact:    Shelbie Hutching, RN Phone Number: 07/23/2020, 11:08 AM  Clinical Narrative:                 Patient placed under observation for stroke.  Patient looking well this morning sitting on the side of the bed, husband is at the bedside.  Patient and husband, Holly Rose, lives in a cottage at the [place].  Patient and husband are mostly independent and he drives.  They do have a housekeeper that comes in on Wed, and another friend/caregiver that checks in daily.  Patient also has very supportive family.  RNCM called and spoke with patient's daughter in law, [name], at request of patient and husband.   No PT follow up recommended.  Plan for discharge is to return to previous living environment.  Patient's husband will drive the patient home at discharge.    Expected Discharge Plan: Home/Self Care Barriers to Discharge: Continued Medical Work up   Patient Goals and CMS Choice Patient states their goals for this hospitalization and ongoing recovery are:: To get back to [place]      Expected Discharge Plan and Services Expected Discharge Plan: Home/Self Care   Discharge Planning Services: [place] arrangements for the past 2 months: [place]                   DME Agency: NA       HH Arranged: NA          Prior Living Arrangements/Services Living arrangements for the past 2 months: [place] Lives with:: Spouse Patient language and need for interpreter reviewed:: Yes Do you feel safe going back to the place where you live?: Yes      Need for Family Participation in Patient Care: Yes (Comment) (stroke) Care giver support system in place?: Yes (comment) (family support) Current home services: DME, Housekeeping  (cane and walker) Criminal Activity/Legal Involvement Pertinent to Current Situation/Hospitalization: No - Comment as needed  Activities of Daily Living      Permission Sought/Granted Permission sought to share information with : Case Manager, Customer service manager, Family Supports Permission granted to share information with : Yes, Verbal Permission Granted  Share Information with NAME: [name] and Holly Rose  Permission granted to share info w AGENCY: [facility]  Permission granted to share info w Relationship: daughter in Sports coach and husband     Emotional Assessment Appearance:: Appears stated age Attitude/Demeanor/Rapport: Engaged Affect (typically observed): Accepting, Pleasant Orientation: : Oriented to Self, Oriented to Place, Oriented to  Time, Oriented to Situation Alcohol / Substance Use: Not Applicable Psych Involvement: No (comment)  Admission diagnosis:  Stroke [facility]) [I63.9] Cerebrovascular accident (CVA), unspecified mechanism ([place]) [I63.9] Dementia without behavioral disturbance, unspecified dementia type ([place]) [F03.90] Patient Active Problem List   Diagnosis Date Noted   Stroke ([place]) 07/22/2020   TIA (transient ischemic attack) 07/22/2020   Depression 07/22/2020   Visual hallucinations 07/21/2020   NSTEMI (non-ST elevated myocardial infarction) ([place]) 04/17/2020   Atrial fibrillation with RVR ([place]) 04/17/2020   Change in mental status 04/15/2020   Head injury 04/15/2020   Dysuria 04/30/2019   Frequent urinary tract infections 04/30/2019   Age-related osteoporosis without current pathological fracture 03/01/2019   Dizziness 08/29/2018   UTI (urinary tract infection)  08/15/2018   Vitamin D deficiency, unspecified 06/16/2017   Hypercalcinuria 06/14/2017   SCC (squamous cell carcinoma), face 03/29/2017   History of kidney stones 03/12/2017   Malignant neoplasm of left female breast ([place]) 12/28/2016   Chronic cystitis 11/05/2016    Nephrolithiasis 04/79/9872   Renal colic 15/87/2761   Urge incontinence 11/05/2016   Bilateral hand pain 11/03/2016   Generalized osteoarthritis of hand 11/03/2016   Numbness and tingling in both hands 11/03/2016   Bradycardia 04/15/2016   History of colonic polyps 09/16/2015   Abdominal pain 08/18/2015   Rectal bleeding 08/18/2015   CAD (coronary artery disease) 02/17/2015   Paroxysmal A-fib ([place]) 02/17/2015   Diabetes mellitus with neuropathy ([place]) 02/17/2015   Hyperlipidemia 02/17/2015   Iron deficiency anemia 02/17/2015   Macular degeneration 02/17/2015   Stress 02/17/2015   Health care maintenance 02/17/2015   Diarrhea 02/17/2015   Type II diabetes mellitus ([place]) 07/13/2014   Diverticulosis 03/31/2013   GI bleeding 03/31/2013   Mitral valve prolapse 03/31/2013   Diverticulosis of intestine without perforation or abscess without bleeding 03/31/2013   PCP:  [name], MD Pharmacy:   [facility] [address] [place] [address] Phone: [phone] Fax: [phone]     Social Determinants of Health (SDOH) Interventions    Readmission Risk Interventions No flowsheet data found.

## 2020-07-23 NOTE — Care Management Obs Status (Signed)
[place] NOTIFICATION   Patient Details  Name: Holly Rose MRN: [id number] Date of Birth: Dec 31, 1929   Medicare Observation Status Notification Given:  Yes    Shelbie Hutching, RN 07/23/2020, 11:15 AM

## 2020-07-23 NOTE — Discharge Summary (Signed)
Physician Discharge Summary  Holly Rose NOM:[id number] DOB: 1929/10/25 DOA: 07/22/2020  PCP: [name], MD  Admit date: 07/22/2020 Discharge date: 07/23/2020  Admitted From: Home Disposition:  Home  Recommendations for Outpatient Follow-up:  1. Follow up with PCP in 1-2 weeks 2. Please obtain BMP/CBC in one week 3. Please follow up on the following pending results:None  Home Health:No Equipment/Devices:None Discharge Condition: Stable CODE STATUS: Full Diet recommendation: Heart Healthy / Carb Modified   Brief/Interim Summary: Holly Rose is a 86 y.o. female with medical history significant of diabetes mellitus, hyperlipidemia, TIA, depression, ICD placement, CAD, myocardial infarction, breast cancer (s/p of radiation in the left lumpectomy), atrial fibrillation not on anticoagulants, anemia, GI bleeding, who presents with intermittent confusion, hallucination and poor balance.  Per the caregiver and her daughter-in-law, patient has been intermittently confused with hallucinations since August.  Patient also has a poor balance.  Her memory has been deteriorating.  Does not seem to have unilateral weakness or tenderness in extremities.  No facial droop or slurred speech.  Patient does not have chest pain, cough, shortness of breath.  No nausea, vomiting, diarrhea or abdominal pain.  No symptoms of UTI.  Per caregiver, patient fell about a week ago. Pt was seen by her neurologist, Dr. Manuella Ghazi on 10/26, and had CT-head scan which showed subacute infarct in the right parietal region.  She was sent to the hospital to complete a stroke work-up.  He was evaluated by neurologist in the hospital.  No residual deficit. CTA was negative for any large vessel occlusion but did show some small segments of stenosis, see the full report below. Echo was not done due to some staff shortage but she did had an echocardiogram done a month prior at her cardiologist office and it was within normal  limits. Due to her persistent atrial fibrillation she had a [place] vasc score of 7, which makes her stroke risk up to close to 12% in 1 year.  Neurology recommended anticoagulation.  Per caregiver patient was once placed on Coumadin with difficult to control her levels.  Then her cardiologist start her on Plavix and discontinued Coumadin.  No history of any extensive bleed except a nosebleed couple of month ago requiring cauterization.  Patient do have some anemia but had a colonoscopy in 2016 and few polyps were removed at that time and pathology was negative for any malignancy. She can have a FOBT checked at her primary care office.  We ordered it here but she never had any bowel movement.  No obvious bleeding. Anemia panel within normal limits and she does take supplements which she will continue taking it. We discontinue aspirin and Plavix to decrease the chance of bleeding and she will only take Eliquis and will follow up with neurology and cardiology as an outpatient.  Echocardiogram can be repeated at her cardiologist office if needed. Our physical therapist also evaluated her and did not see any need for continuation of therapy.  She will continue with rest of her home meds except aspirin and Plavix and will follow up with her providers.  Discharge Diagnoses:  Principal Problem:   Stroke [facility]) Active Problems:   CAD (coronary artery disease)   Paroxysmal A-fib (HCC)   Diabetes mellitus with neuropathy (HCC)   Hyperlipidemia   Iron deficiency anemia   Depression   Dementia without behavioral disturbance [facility])  Discharge Instructions  Discharge Instructions    Diet - low sodium heart healthy   Complete by: As directed  Discharge instructions   Complete by: As directed    It was pleasure taking care of you. After discussing with your daughter-in-law and neurologist we decided to place you on a blood thinner called Eliquis to decrease the chance of future strokes.  You are high risk  because of your abnormal heart rhythm. Please stop taking aspirin and Plavix and start taking Eliquis. If you experience any abnormal bleeding please seek medical attention. Please follow-up with your neurologist and cardiologist for further recommendations.   Increase activity slowly   Complete by: As directed      Allergies as of 07/23/2020      Reactions   Lyrica [pregabalin] Swelling   Sleepy, does not eat   Neurontin [gabapentin] Nausea And Vomiting, Other (See Comments)   disoriented   Bactrim [sulfamethoxazole-trimethoprim]    Made her dizziness      Medication List    STOP taking these medications   Bayer Aspirin EC Low Dose 81 MG EC tablet Generic drug: aspirin   clopidogrel 75 MG tablet Commonly known as: PLAVIX   mupirocin cream 2 % Commonly known as: Bactroban     TAKE these medications   alendronate 70 MG tablet Commonly known as: FOSAMAX Take 1 tablet (70 mg total) by mouth once a week. Take with a full glass of water on an empty stomach.   apixaban 2.5 MG Tabs tablet Commonly known as: ELIQUIS Take 1 tablet (2.5 mg total) by mouth 2 (two) times daily.   atorvastatin 40 MG tablet Commonly known as: LIPITOR Take 1 tablet (40 mg total) by mouth daily.   blood glucose meter kit and supplies Per patient please dispense One touch Ultra2 meter.Use meter and supplies three times a day to check blood sugar. ICD10: E11.40   divalproex 125 MG DR tablet Commonly known as: Depakote Take one tablet q hs for one week and then one tablet bid What changed:   how much to take  how to take this  when to take this   Eylea 2 MG/0.05ML Soln Generic drug: Aflibercept 1 each by Intravitreal route as directed.   freestyle lancets 2 (two) times daily.   FreeStyle Lite Devi 2 (two) times daily.   furosemide 20 MG tablet Commonly known as: LASIX Take 1 tablet (20 mg total) by mouth 2 (two) times daily.   glucose blood test strip Use as instructed to check  sugars twice daily. Dx E11.9   HAIR/SKIN/NAILS PO Take 3 tablets by mouth daily. 2 in the am, 1 in the pm   isosorbide mononitrate 30 MG 24 hr tablet Commonly known as: IMDUR Take 1 tablet (30 mg total) by mouth daily.   Magnesium 200 MG Tabs Take 200 mg by mouth 2 (two) times a day.   meclizine 12.5 MG tablet Commonly known as: ANTIVERT Take 1 tablet (12.5 mg total) by mouth 2 (two) times daily as needed for dizziness.   metFORMIN 500 MG 24 hr tablet Commonly known as: GLUCOPHAGE-XR Take 1 tablet (500 mg total) by mouth 2 (two) times daily.   metoprolol tartrate 25 MG tablet Commonly known as: LOPRESSOR Take 25 mg by mouth 2 (two) times daily.   mirtazapine 7.5 MG tablet Commonly known as: REMERON Take 1 tablet (7.5 mg total) by mouth at bedtime.   mupirocin ointment 2 % Commonly known as: BACTROBAN Apply 1 application topically 2 (two) times daily.   nitroGLYCERIN 0.4 MG SL tablet Commonly known as: NITROSTAT Place 0.4 mg under the tongue every 5 (  five) minutes as needed for chest pain.   PRESERVISION AREDS 2 PO Take 1 tablet by mouth 2 (two) times daily.   PROBIOTIC DAILY PO Take by mouth.   SYSTANE OP Apply 1 drop to eye 2 (two) times daily.   tamoxifen 20 MG tablet Commonly known as: NOLVADEX Take 1 tablet by mouth once daily   vitamin B-12 500 MCG tablet Commonly known as: CYANOCOBALAMIN Take 500 mcg by mouth 2 (two) times a day.   Vitamin D 50 MCG (2000 UT) Caps Take 4,000 Units by mouth daily.       Follow-up Information    [name], MD. Schedule an appointment as soon as possible for a visit in 1 week(s).   Specialty: Internal Medicine Contact information: [address] [place] [address] [phone]        Vladimir Crofts, MD. Schedule an appointment as soon as possible for a visit in 2 week(s).   Specialty: Neurology Contact information: [address] [facility] [place]  [place] [address] [phone]        Teodoro Spray, MD. Schedule an appointment as soon as possible for a visit.   Specialty: Cardiology Contact information: [address] [place] [address] [phone]              Allergies  Allergen Reactions  . Lyrica [Pregabalin] Swelling    Sleepy, does not eat  . Neurontin [Gabapentin] Nausea And Vomiting and Other (See Comments)    disoriented  . Bactrim [Sulfamethoxazole-Trimethoprim]     Made her dizziness    Consultations:  Neurology  Procedures/Studies: CT ANGIO HEAD W OR WO CONTRAST  Result Date: 07/22/2020 CLINICAL DATA:  Initial evaluation for acute stroke. EXAM: CT ANGIOGRAPHY HEAD AND NECK TECHNIQUE: Multidetector CT imaging of the head and neck was performed using the standard protocol during bolus administration of intravenous contrast. Multiplanar CT image reconstructions and MIPs were obtained to evaluate the vascular anatomy. Carotid stenosis measurements (when applicable) are obtained utilizing NASCET criteria, using the distal internal carotid diameter as the denominator. CONTRAST:  28m OMNIPAQUE IOHEXOL 350 MG/ML SOLN COMPARISON:  Prior CT from earlier the same day. FINDINGS: CTA NECK FINDINGS Aortic arch: Visualized aortic arch of normal caliber with normal branch pattern. Mild-to-moderate atheromatous change about the arch and origin of the great vessels without hemodynamically significant stenosis. Right carotid system: Right CCA patent from its origin to the bifurcation without stenosis. Atheromatous change about the right bifurcation without hemodynamically significant stenosis. Right ICA widely patent distally without stenosis, dissection or occlusion. No made of a focal severe stenosis at the origin of the right external carotid artery (series 6, image 155). Left carotid system: Left CCA patent from its origin to the bifurcation without stenosis. Scattered calcified plaque about the left bifurcation/proximal  left ICA with associated stenosis of up to 40% by NASCET criteria. Left ICA patent distally to the skull base without stenosis, dissection or occlusion. Vertebral arteries: Both vertebral arteries arise from the subclavian arteries. No proximal subclavian artery stenosis. Vertebral arteries widely patent without stenosis, dissection or occlusion. Skeleton: No acute osseous abnormality. No discrete or worrisome osseous lesions. Moderate cervical spondylosis of present at C5-6 and C6-7. Other neck: No other acute soft tissue abnormality within the neck. 4 mm right thyroid nodule noted, felt to be of doubtful significance given size and patient age. No follow-up imaging recommended regarding this lesion. No other mass lesion or adenopathy within the neck. Upper chest: Small layering bilateral  pleural effusions noted within the visualized lungs. Emphysematous changes noted. Review of the MIP images confirms the above findings CTA HEAD FINDINGS Anterior circulation: Petrous segments patent bilaterally. Mild scattered atheromatous plaque within the carotid siphons without hemodynamically significant stenosis. A1 segments patent bilaterally. Normal anterior communicating artery complex. Anterior cerebral arteries patent to their distal aspects without stenosis. No M1 stenosis or occlusion. Normal MCA bifurcations. Distal MCA branches well perfused and symmetric. Posterior circulation: Vertebral arteries patent to the vertebrobasilar junction without stenosis. Left vertebral artery dominant. Patent right PICA. Left PICA not seen. Basilar patent to its distal aspect without stenosis. Superior cerebral arteries patent bilaterally. Both PCAs primarily supplied via the basilar. Left PCA well perfused to its distal aspect without significant stenosis. There is a short-segment severe proximal right P2 stenosis (series 7, image 109). The right PCA is attenuated and small but remains patent to its distal aspect. Venous sinuses:  Patent. Anatomic variants: None significant.  No aneurysm. Review of the MIP images confirms the above findings IMPRESSION: 1. Negative CTA for large vessel occlusion. 2. Short-segment severe proximal right P2 stenosis. The right PCA is attenuated distally but remains patent. Finding in keeping with the chronic right PCA territory infarct. 3. Short-segment 40% atheromatous stenosis at the origin of the left ICA. 4. Short-segment severe stenosis at the origin of the right external carotid artery. 5. Small layering bilateral pleural effusions. 6. Emphysema (ICD10-J43.9). Electronically Signed   By: Jeannine Boga M.D.   On: 07/22/2020 23:49   CT HEAD WO CONTRAST  Addendum Date: 07/22/2020   ADDENDUM REPORT: 07/22/2020 08:48 ADDENDUM: These results were called by telephone at the time of interpretation on 07/22/2020 at 8:38 am to provider [facility] , who verbally acknowledged these results. Electronically Signed   By: Primitivo Gauze M.D.   On: 07/22/2020 08:48   Result Date: 07/22/2020 CLINICAL DATA:  Fall, confusion and hallucinations. EXAM: CT HEAD WITHOUT CONTRAST TECHNIQUE: Contiguous axial images were obtained from the base of the skull through the vertex without intravenous contrast. COMPARISON:  04/15/2020 head CT and prior. FINDINGS: Brain: New subacute right MCA territory infarct. No intracranial hemorrhage. Chronic right occipital insult. Mild cerebral atrophy with ex vacuo dilatation. Chronic microvascular ischemic changes. No mass lesion. No midline shift, ventriculomegaly or extra-axial fluid collection. Vascular: No hyperdense vessel or unexpected calcification. Bilateral skull base atherosclerotic calcifications. Skull: Negative for fracture or focal lesion. Sinuses/Orbits: Normal orbits. Clear paranasal sinuses. No mastoid effusion. Other: None. IMPRESSION: New subacute right MCA territory infarct. Mild cerebral atrophy and chronic microvascular ischemic changes. Remote right occipital  insult. Electronically Signed: By: Primitivo Gauze M.D. On: 07/22/2020 08:32   CT ANGIO NECK W OR WO CONTRAST  Result Date: 07/22/2020 CLINICAL DATA:  Initial evaluation for acute stroke. EXAM: CT ANGIOGRAPHY HEAD AND NECK TECHNIQUE: Multidetector CT imaging of the head and neck was performed using the standard protocol during bolus administration of intravenous contrast. Multiplanar CT image reconstructions and MIPs were obtained to evaluate the vascular anatomy. Carotid stenosis measurements (when applicable) are obtained utilizing NASCET criteria, using the distal internal carotid diameter as the denominator. CONTRAST:  62m OMNIPAQUE IOHEXOL 350 MG/ML SOLN COMPARISON:  Prior CT from earlier the same day. FINDINGS: CTA NECK FINDINGS Aortic arch: Visualized aortic arch of normal caliber with normal branch pattern. Mild-to-moderate atheromatous change about the arch and origin of the great vessels without hemodynamically significant stenosis. Right carotid system: Right CCA patent from its origin to the bifurcation without stenosis. Atheromatous change about the right  bifurcation without hemodynamically significant stenosis. Right ICA widely patent distally without stenosis, dissection or occlusion. No made of a focal severe stenosis at the origin of the right external carotid artery (series 6, image 155). Left carotid system: Left CCA patent from its origin to the bifurcation without stenosis. Scattered calcified plaque about the left bifurcation/proximal left ICA with associated stenosis of up to 40% by NASCET criteria. Left ICA patent distally to the skull base without stenosis, dissection or occlusion. Vertebral arteries: Both vertebral arteries arise from the subclavian arteries. No proximal subclavian artery stenosis. Vertebral arteries widely patent without stenosis, dissection or occlusion. Skeleton: No acute osseous abnormality. No discrete or worrisome osseous lesions. Moderate cervical  spondylosis of present at C5-6 and C6-7. Other neck: No other acute soft tissue abnormality within the neck. 4 mm right thyroid nodule noted, felt to be of doubtful significance given size and patient age. No follow-up imaging recommended regarding this lesion. No other mass lesion or adenopathy within the neck. Upper chest: Small layering bilateral pleural effusions noted within the visualized lungs. Emphysematous changes noted. Review of the MIP images confirms the above findings CTA HEAD FINDINGS Anterior circulation: Petrous segments patent bilaterally. Mild scattered atheromatous plaque within the carotid siphons without hemodynamically significant stenosis. A1 segments patent bilaterally. Normal anterior communicating artery complex. Anterior cerebral arteries patent to their distal aspects without stenosis. No M1 stenosis or occlusion. Normal MCA bifurcations. Distal MCA branches well perfused and symmetric. Posterior circulation: Vertebral arteries patent to the vertebrobasilar junction without stenosis. Left vertebral artery dominant. Patent right PICA. Left PICA not seen. Basilar patent to its distal aspect without stenosis. Superior cerebral arteries patent bilaterally. Both PCAs primarily supplied via the basilar. Left PCA well perfused to its distal aspect without significant stenosis. There is a short-segment severe proximal right P2 stenosis (series 7, image 109). The right PCA is attenuated and small but remains patent to its distal aspect. Venous sinuses: Patent. Anatomic variants: None significant.  No aneurysm. Review of the MIP images confirms the above findings IMPRESSION: 1. Negative CTA for large vessel occlusion. 2. Short-segment severe proximal right P2 stenosis. The right PCA is attenuated distally but remains patent. Finding in keeping with the chronic right PCA territory infarct. 3. Short-segment 40% atheromatous stenosis at the origin of the left ICA. 4. Short-segment severe stenosis at  the origin of the right external carotid artery. 5. Small layering bilateral pleural effusions. 6. Emphysema (ICD10-J43.9). Electronically Signed   By: Jeannine Boga M.D.   On: 07/22/2020 23:49     Subjective: Patient was seen and examined before discharge today.  No new complaint.  Husband was at bedside.  Talked with daughter-in-law on phone who agrees with the current plan of starting Eliquis and stopping aspirin and Plavix.  Discharge Exam: Vitals:   07/23/20 0818 07/23/20 1126  BP: 113/75 114/66  Pulse: 79 68  Resp: 18 18  Temp: (!) 97.5 F (36.4 C) 97.8 F (36.6 C)  SpO2: 100% 99%   Vitals:   07/23/20 0326 07/23/20 0624 07/23/20 0818 07/23/20 1126  BP: (!) 94/49 128/79 113/75 114/66  Pulse: 60 (!) 108 79 68  Resp:  _0 Temp: 97.7 F (36.5 C) 97.7 F (36.5 C) (!) 97.5 F (36.4 C) 97.8 F (36.6 C)  TempSrc: Oral  Oral Oral  SpO2: 96% 97% 100% 99%  Weight:      Height:        General: Pt is alert, awake, not in acute distress Cardiovascular: RRR,  S1/S2 +, no rubs, no gallops Respiratory: CTA bilaterally, no wheezing, no rhonchi Abdominal: Soft, NT, ND, bowel sounds + Extremities: no edema, no cyanosis   The results of significant diagnostics from this hospitalization (including imaging, microbiology, ancillary and laboratory) are listed below for reference.    Microbiology: No results found for this or any previous visit (from the past 240 hour(s)).   Labs: BNP (last 3 results) No results for input(s): BNP in the last 8760 hours. Basic Metabolic Panel: Recent Labs  Lab 07/22/20 0953  NA 139  K 3.6  CL 103  CO2 29  GLUCOSE 96  BUN 18  CREATININE 0.79  CALCIUM 9.2   Liver Function Tests: Recent Labs  Lab 07/22/20 0953  AST 37  ALT 28  ALKPHOS 28*  BILITOT 0.9  PROT 6.9  ALBUMIN 3.2*   No results for input(s): LIPASE, AMYLASE in the last 168 hours. No results for input(s): AMMONIA in the last 168 hours. CBC: Recent Labs  Lab  07/22/20 0953  WBC 3.4*  NEUTROABS 2.3  HGB 9.5*  HCT 29.8*  MCV 94.6  PLT 171   Cardiac Enzymes: No results for input(s): CKTOTAL, CKMB, CKMBINDEX, TROPONINI in the last 168 hours. BNP: Invalid input(s): POCBNP CBG: Recent Labs  Lab 07/22/20 2122 07/23/20 0810 07/23/20 1129  GLUCAP 155* 98 188*   D-Dimer No results for input(s): DDIMER in the last 72 hours. Hgb A1c Recent Labs    07/23/20 0547  HGBA1C 6.2*   Lipid Profile Recent Labs    07/23/20 0547  CHOL 99  HDL 41  LDLCALC 46  TRIG 60  CHOLHDL 2.4   Thyroid function studies No results for input(s): TSH, T4TOTAL, T3FREE, THYROIDAB in the last 72 hours.  Invalid input(s): FREET3 Anemia work up Recent Labs    07/23/20 0547  VITAMINB12 1,326*  FOLATE 28.0  FERRITIN 22  TIBC 280  IRON 32  RETICCTPCT 1.8   Urinalysis    Component Value Date/Time   COLORURINE Dark Yellow (A) 05/23/2020 1333   APPEARANCEUR Cloudy (A) 05/23/2020 1333   APPEARANCEUR Clear 06/07/2019 0940   LABSPEC 1.020 05/23/2020 1333   PHURINE 5.5 05/23/2020 1333   GLUCOSEU NEGATIVE 05/23/2020 1333   HGBUR NEGATIVE 05/23/2020 1333   BILIRUBINUR NEGATIVE 05/23/2020 1333   BILIRUBINUR Negative 06/07/2019 0940   KETONESUR NEGATIVE 05/23/2020 1333   PROTEINUR Trace (A) 06/07/2019 0940   PROTEINUR NEGATIVE 08/15/2018 1608   UROBILINOGEN 0.2 05/23/2020 1333   NITRITE POSITIVE (A) 05/23/2020 1333   LEUKOCYTESUR MODERATE (A) 05/23/2020 1333   Sepsis Labs Invalid input(s): PROCALCITONIN,  WBC,  LACTICIDVEN Microbiology No results found for this or any previous visit (from the past 240 hour(s)).  Time coordinating discharge: Over 30 minutes  SIGNED:  Lorella Nimrod, MD  [facility] 07/23/2020, 1:10 PM  If 7PM-7AM, please contact night-coverage [email or web address]  This record has been created using Systems analyst. Errors have been sought and corrected,but may not always be located. Such creation errors do not  reflect on the standard of care.

## 2020-07-23 NOTE — Evaluation (Signed)
Physical Therapy Evaluation Patient Details Name: Holly Rose MRN: [id number] DOB: [birth year] Today's Date: 07/23/2020   History of Present Illness  presented to ER secondary to AMS, hallucinations and decreased balance; admitted for management of sub-acute CVA (R MCA infarct).  Clinical Impression  Upon evaluation, patient alert and oriented to self, location and general situation; follows commands, pleasant and cooperative throughout evaluation. Bilat UE/LE strength and ROM grossly symmetrical and WFL; no focal weakness, sensory or coordination deficit appreciated. Able to complete sit/stand, basic transfers and gait (200') with RW, cga/close sup.  Demonstrates reciprocal stepping pattern, mild increase in sway throughout gait cycle, but no overt buckling or LOB.  Slow, but fairly steady, cadence; voices performance is baseline for her. Husband present to observe and confirms. Appears to be at baseline level of functional ability; no acute PT needs identified at this time. Okay for discharge home when medically appropriate; no follow up PT needs at discharge.  Will complete initial order; please re-consult should needs change.    Follow Up Recommendations No PT follow up    Equipment Recommendations       Recommendations for Other Services       Precautions / Restrictions Precautions Precautions: Fall Restrictions Weight Bearing Restrictions: No      Mobility  Bed Mobility               General bed mobility comments: seated edge of bed beginning/end of session    Transfers Overall transfer level: Modified independent Equipment used: None Transfers: Sit to/from Stand Sit to Stand: Supervision;Modified independent (Device/Increase time)         General transfer comment: tends to pull on RW  Ambulation/Gait Ambulation/Gait assistance: Supervision Gait Distance (Feet): 200 Feet Assistive device: Rolling walker (2 wheeled)   Gait velocity: 10' walk time, 11-12  seconds   General Gait Details: reciprocal stepping pattern, mild increase in sway throughout gait cycle, but no overt buckling or LOB.  Slow, but fairly steady, cadence; voices performance is baseline for her. Husband present to observe and confirms.  Stairs            Wheelchair Mobility    Modified Rankin (Stroke Patients Only)       Balance Overall balance assessment: Needs assistance Sitting-balance support: No upper extremity supported;Feet supported Sitting balance-Leahy Scale: Good     Standing balance support: Bilateral upper extremity supported Standing balance-Leahy Scale: Fair                               Pertinent Vitals/Pain Pain Assessment: No/denies pain    Home Living Family/patient expects to be discharged to:: Private residence Living Arrangements: Spouse/significant other Available Help at Discharge: Family;Available 24 hours/day Type of Home: Independent living facility ([facility]) Home Access: Level entry     Home Layout: One level Home Equipment: Shower seat - built in;Walker - 2 wheels;Grab bars - toilet;Grab bars - tub/shower      Prior Function Level of Independence: Independent with assistive device(s)         Comments: Sup/mod indep with ADLs, household mobilization with RW; husband drives.  Family assists with community needs/errands.  Recently completed course of HHPT     Hand Dominance   Dominant Hand: Right    Extremity/Trunk Assessment   Upper Extremity Assessment Upper Extremity Assessment: Overall WFL for tasks assessed (grossly at least 4+/5 throughout; no focal weakness, sensory or coordination deficit appreciated)  Lower Extremity Assessment Lower Extremity Assessment: Overall WFL for tasks assessed (grossly at least 4+/5 throughout; no focal weakness, sensory or coordination deficit appreciated)       Communication   Communication: No difficulties  Cognition Arousal/Alertness:  Awake/alert Behavior During Therapy: WFL for tasks assessed/performed Overall Cognitive Status: Within Functional Limits for tasks assessed                                 General Comments: oriented to self, location; follows simple commands, pleasant and cooperative      General Comments      Exercises     Assessment/Plan    PT Assessment Patent does not need any further PT services  PT Problem List Decreased balance;Decreased mobility;Decreased activity tolerance       PT Treatment Interventions Gait training;DME instruction;Functional mobility training;Neuromuscular re-education;Balance training;Patient/family education    PT Goals (Current goals can be found in the Care Plan section)  Acute Rehab PT Goals Patient Stated Goal: to return home PT Goal Formulation: All assessment and education complete, DC therapy Time For Goal Achievement: 07/23/20 Potential to Achieve Goals: Good    Frequency     Barriers to discharge        Co-evaluation               AM-PAC PT "6 Clicks" Mobility  Outcome Measure Help needed turning from your back to your side while in a flat bed without using bedrails?: None Help needed moving from lying on your back to sitting on the side of a flat bed without using bedrails?: None Help needed moving to and from a bed to a chair (including a wheelchair)?: None Help needed standing up from a chair using your arms (e.g., wheelchair or bedside chair)?: None Help needed to walk in hospital room?: None Help needed climbing 3-5 steps with a railing? : A Little 6 Click Score: 23    End of Session Equipment Utilized During Treatment: Gait belt Activity Tolerance: Patient tolerated treatment well Patient left: in bed;with call bell/phone within reach;with nursing/sitter in room (sitter at bedside) Nurse Communication: Mobility status PT Visit Diagnosis: Muscle weakness (generalized) (M62.81);Difficulty in walking, not elsewhere  classified (R26.2)    Time: 8657-8469 PT Time Calculation (min) (ACUTE ONLY): 19 min   Charges:   PT Evaluation $PT Eval Moderate Complexity: 1 Mod         Holly Rose, PT, DPT, NCS 07/23/20, 10:23 AM [phone]

## 2020-07-23 NOTE — Progress Notes (Signed)
Subjective: She apparently had some sundowning overnight, has a sitter but seems better this morning.  Exam: Vitals:   07/23/20 0624 07/23/20 0818  BP: 128/79 113/75  Pulse: (!) 108 79  Resp: 18 18  Temp: 97.7 F (36.5 C) (!) 97.5 F (36.4 C)  SpO2: 97% 100%   Gen: In bed, NAD Resp: non-labored breathing, no acute distress Abd: soft, nt  Neuro: MS: Awake, alert, oriented to person, place, month and year CN: She is able to see finger wiggling in all four visual fields, but with some difficulty counting fingers on the left Motor: Moves all extremities well Sensory: Intact light touch   Pertinent Labs: Hemoglobin of 9.5  Impression: 86 year old female with a history of atrial fibrillation not on anticoagulation due to falls. I suspect her stroke is due to embolism from atrial fibrillation.   She does not appear to have very frequent falls, but she did have one in August and another one recently, I am not sure that she is enough of a fall risk to preclude anticoagulation.  She also, however, appears to have a low hemoglobin and if chronic bleeding is an issue, then this may preclude it.   Her Chads-vasc is 7 suggesting a 11.2% risk of stroke per year.   Recommendations: 1) Eliquis 2.5mg  BID(Age > 80 and weight < 60kg) if no concerns for active bleeding.  2) Therapy as needed.   Roland Rack, MD [facility] [phone]  If 7pm- 7am, please page neurology on call as listed in [place].

## 2020-07-23 NOTE — Progress Notes (Signed)
Pt has been trying to get out of bed numerous times without using the call bell. Having increased urine frequency due to receiving Lasix. Pt very hard to redirect and becoming increasingly more confused throughout shift. Requested sitter order due to pt's excessive attempts to get out of bed without help. Awaiting reply from NP. Will continue to monitor.

## 2020-07-23 NOTE — Evaluation (Signed)
Occupational Therapy Evaluation Patient Details Name: Holly Rose MRN: [id number] DOB: [birth year] Today's Date: 07/23/2020    History of Present Illness presented to ER secondary to AMS, hallucinations and decreased balance; admitted for management of sub-acute CVA (R MCA infarct).   Clinical Impression   Patient presenting with decreased I in self care,balance, functional mobility/transfer, endurance, and safety awarness. Patient reports living at home with spouse at mod I level with use of RW and other modifications as needed PTA. Pt's family assist with community needs and grocery shopping.  Patient currently functioning at supervision overall with use of RW and min cuing for safety. Patient will benefit from acute OT to increase overall independence in the areas of ADLs, functional mobility, and safety awareness in order to safely discharge home with spouse.    Follow Up Recommendations  No OT follow up;Supervision - Intermittent    Equipment Recommendations  3 in 1 bedside commode       Precautions / Restrictions Precautions Precautions: Fall Restrictions Weight Bearing Restrictions: No      Mobility Bed Mobility Overal bed mobility: Modified Independent             General bed mobility comments: seated edge of bed beginning/end of session    Transfers Overall transfer level: Needs assistance Equipment used: Rolling walker (2 wheeled) Transfers: Sit to/from [employer] Sit to Stand: Supervision Stand pivot transfers: Supervision       General transfer comment: tends to pull on RW    Balance Overall balance assessment: Needs assistance Sitting-balance support: No upper extremity supported;Feet supported Sitting balance-Leahy Scale: Good     Standing balance support: Bilateral upper extremity supported Standing balance-Leahy Scale: Fair       ADL either performed or assessed with clinical judgement   ADL Overall ADL's : Needs  assistance/impaired    General ADL Comments: supervision overall with self care tasks with use of RW     Vision Patient Visual Report: No change from baseline              Pertinent Vitals/Pain Pain Assessment: No/denies pain     Hand Dominance Right   Extremity/Trunk Assessment Upper Extremity Assessment Upper Extremity Assessment: Overall WFL for tasks assessed   Lower Extremity Assessment Lower Extremity Assessment: Overall WFL for tasks assessed (grossly at least 4+/5 throughout; no focal weakness, sensory or coordination deficit appreciated)       Communication Communication Communication: No difficulties   Cognition Arousal/Alertness: Awake/alert Behavior During Therapy: WFL for tasks assessed/performed Overall Cognitive Status: Within Functional Limits for tasks assessed      General Comments: oriented to self, location; follows simple commands, pleasant and cooperative              Home Living Family/patient expects to be discharged to:: Private residence Living Arrangements: Spouse/significant other Available Help at Discharge: Family;Available 24 hours/day Type of Home: Independent living facility Home Access: Level entry     Home Layout: One level     Bathroom Shower/Tub: Walk-in shower;Door   [employer] Toilet: Handicapped height Bathroom Accessibility: Yes   Home Equipment: Shower seat - built in;Walker - 2 wheels;Grab bars - toilet;Grab bars - tub/shower   Additional Comments: typically uses RW at home      Prior Functioning/Environment Level of Independence: Independent with assistive device(s)        Comments: Sup/mod indep with ADLs, household mobilization with RW; husband drives.  Family assists with community needs/errands.  Recently completed course of HHPT  OT Problem List: Decreased strength;Impaired balance (sitting and/or standing);Decreased safety awareness;Decreased activity tolerance;Decreased knowledge of use of  DME or AE      OT Treatment/Interventions: Self-care/ADL training;Therapeutic exercise;Patient/family education;Balance training;Therapeutic activities;DME and/or AE instruction;Energy conservation    OT Goals(Current goals can be found in the care plan section) Acute Rehab OT Goals Patient Stated Goal: to return home OT Goal Formulation: With patient/family Time For Goal Achievement: 08/06/20 Potential to Achieve Goals: Good  OT Frequency: Min 2X/week   Barriers to D/C: Other (comment)  none known at this time          AM-PAC OT "6 Clicks" Daily Activity     Outcome Measure Help from another person eating meals?: None Help from another person taking care of personal grooming?: None Help from another person toileting, which includes using toliet, bedpan, or urinal?: A Little Help from another person bathing (including washing, rinsing, drying)?: A Little Help from another person to put on and taking off regular upper body clothing?: None Help from another person to put on and taking off regular lower body clothing?: A Little 6 Click Score: 21   End of Session Equipment Utilized During Treatment: Rolling walker Nurse Communication: Mobility status  Activity Tolerance: Patient tolerated treatment well Patient left: in bed;with call bell/phone within reach;with nursing/sitter in room;with family/visitor present  OT Visit Diagnosis: Repeated falls (R29.6);Muscle weakness (generalized) (M62.81);History of falling (Z91.81)                Time: 6168-3729 OT Time Calculation (min): 23 min Charges:  OT General Charges $OT Visit: 1 Visit OT Evaluation $OT Eval Low Complexity: 1 Low OT Treatments $Self Care/Home Management : 8-22 mins  Darleen Crocker, MS, OTR/L , CBIS ascom [phone]  07/23/20, 12:00 PM

## 2020-07-23 NOTE — Telephone Encounter (Signed)
TCM to be completed in 2 business days after discharge.

## 2020-07-23 NOTE — Telephone Encounter (Signed)
Patient is being released from the hospital to go home today. Patient needs a 1 week hospital follow up. No appointments are available with Dr. [name].

## 2020-07-24 ENCOUNTER — Encounter: Payer: Self-pay | Admitting: Podiatry

## 2020-07-24 ENCOUNTER — Other Ambulatory Visit: Payer: Self-pay

## 2020-07-24 ENCOUNTER — Ambulatory Visit (INDEPENDENT_AMBULATORY_CARE_PROVIDER_SITE_OTHER): Payer: Medicare Other | Admitting: Podiatry

## 2020-07-24 ENCOUNTER — Telehealth: Payer: Self-pay

## 2020-07-24 DIAGNOSIS — L97521 Non-pressure chronic ulcer of other part of left foot limited to breakdown of skin: Secondary | ICD-10-CM | POA: Diagnosis not present

## 2020-07-24 DIAGNOSIS — E1142 Type 2 diabetes mellitus with diabetic polyneuropathy: Secondary | ICD-10-CM | POA: Diagnosis not present

## 2020-07-24 DIAGNOSIS — L97511 Non-pressure chronic ulcer of other part of right foot limited to breakdown of skin: Secondary | ICD-10-CM

## 2020-07-24 NOTE — Telephone Encounter (Signed)
Transition Care Management Follow-up Telephone Call  Date of discharge and from where: 07/23/20 from [facility].  How have you been since you were released from the hospital? Information received from daughter, HIPAA compliant. Notes patient is doing good overall. No stroke signs/symptoms presenting. Baseline.   Any questions or concerns? No  Items Reviewed:  Did the pt receive and understand the discharge instructions provided? Yes   Medications obtained and verified? Yes . Daughter manages.   Any new allergies since your discharge? No   Dietary orders reviewed? Heart healthy/carb modified.  Do you have support at home? Yes , husband and aide.   Home Care and Equipment/Supplies: Were home health services ordered? No. Aide is private pay and has been with patient 5 days weekly, 4 hours since her heart attack. Were any new equipment or medical supplies ordered?  No  Functional Questionnaire: (I = Independent and D = Dependent) ADLs: i  Bathing/Dressing- i  Eating- i  Maintaining continence- i  Transferring/Ambulation- walker as needed. Pendant/call button in place.   Managing Meds- Daughter manages.   Follow up appointments reviewed:   PCP Hospital f/u appt confirmed? Yes  Scheduled to see Dr. [name] on 07/26/20 @ 11:00.  [facility] f/u appt confirmed? Yes  Scheduled to see Neurology and Cardiology on 08/06/20.  Are transportation arrangements needed? No   If their condition worsens, is the pt aware to call PCP or go to the Emergency Dept.? Yes  Was the patient provided with contact information for the PCP's office or ED? Yes  Was the pt encouraged to call back with questions or concerns? Yes

## 2020-07-24 NOTE — Progress Notes (Signed)
She presents today for follow-up of her ulceration she states that they are doing better.  Objective: Vital signs are stable she alert oriented x3 diabetic ulcerations plantar aspect of the forefoot demonstrate well healing ulcerations I debrided all reactive hyperkeratotic tissue today there is no bleeding noted.  No open lesions are noted.  Assessment: Well-healing decubitus ulcerations forefoot bilateral.  Plan: Placed padding today after debridement instructed her to continue to do exactly what she is been doing because is been working for her I will follow-up with her in about 3 weeks

## 2020-07-26 ENCOUNTER — Ambulatory Visit (INDEPENDENT_AMBULATORY_CARE_PROVIDER_SITE_OTHER): Payer: Medicare Other | Admitting: Internal Medicine

## 2020-07-26 ENCOUNTER — Other Ambulatory Visit: Payer: Self-pay

## 2020-07-26 VITALS — BP 124/70 | HR 90 | Temp 97.8°F | Resp 16 | Ht 63.0 in | Wt 127.0 lb

## 2020-07-26 DIAGNOSIS — I4891 Unspecified atrial fibrillation: Secondary | ICD-10-CM

## 2020-07-26 DIAGNOSIS — E559 Vitamin D deficiency, unspecified: Secondary | ICD-10-CM

## 2020-07-26 DIAGNOSIS — D509 Iron deficiency anemia, unspecified: Secondary | ICD-10-CM | POA: Diagnosis not present

## 2020-07-26 DIAGNOSIS — R441 Visual hallucinations: Secondary | ICD-10-CM | POA: Diagnosis not present

## 2020-07-26 DIAGNOSIS — E785 Hyperlipidemia, unspecified: Secondary | ICD-10-CM

## 2020-07-26 DIAGNOSIS — E1140 Type 2 diabetes mellitus with diabetic neuropathy, unspecified: Secondary | ICD-10-CM

## 2020-07-26 DIAGNOSIS — C50912 Malignant neoplasm of unspecified site of left female breast: Secondary | ICD-10-CM | POA: Diagnosis not present

## 2020-07-26 DIAGNOSIS — E1165 Type 2 diabetes mellitus with hyperglycemia: Secondary | ICD-10-CM | POA: Diagnosis not present

## 2020-07-26 DIAGNOSIS — I2510 Atherosclerotic heart disease of native coronary artery without angina pectoris: Secondary | ICD-10-CM | POA: Diagnosis not present

## 2020-07-26 NOTE — Progress Notes (Signed)
Patient ID: Holly Rose, female   DOB: Apr 18, 1930, 86 y.o.   MRN: [id number]   Subjective:    Patient ID: Holly Rose, female    DOB: 1930-05-05, 86 y.o.   MRN: [id number]  HPI This visit occurred during the SARS-CoV-2 public health emergency.  Safety protocols were in place, including screening questions prior to the visit, additional usage of staff PPE, and extensive cleaning of exam room while observing appropriate contact time as indicated for disinfecting solutions.  Patient here for hospital follow up.  She is accompanied by her daughter-n-law Cheryll Dessert).  History obtained from both of them.  Admitted 07/22/20 - 07/23/20.  Admitted with intermittent confusion, hallucination and poor balance.  Did fall prior to hospitalization.  No LOC.  Injured her tail bone.  CT head - showed subacute infarct in the right parietal region.  CTA was negative for any large vessel occlusion but did show some small segments of stenosis. With history of afib, was started on eliquis. Discharged home.  Did not feel PT needed.  Has f/u planned with cardiology and neurology.  Eating.  No nausea or vomiting.  No abdominal pain.  No chest pain or sob reported.  hgb noted to be decreased - in hospital.  No rectal bleeding noted.  Has had issues previously with iron def anemia.  Previously required iron infusions.  Still with visual hallucinations.  On remeron and drpakote.  Appears to be doing some better.    Past Medical History:  Diagnosis Date  . Allergy   . Anemia   . Arthritis   . Atrial fibrillation ([place]) 2004  . Breast cancer, left ([place]) 10/2017   Lumpectomy and rad tx's.   Marland Kitchen CAD (coronary artery disease)   . Complication of anesthesia    hard to wake up with general anesthesia  . Diabetes ([place])   . Dysrhythmia    A-fib  . Heart attack ([place]) 2008  . Heart murmur   . History of kidney stones   . History of sick sinus syndrome    s/p pacemaker placement  . Hyperlipidemia   . Macular degeneration    . Neuropathy   . Neuropathy   . Pacemaker 06/2010  . Personal history of radiation therapy 11/2017   LEFT lumpectomy   . Squamous cell skin cancer   . TIA (transient ischemic attack)    Past Surgical History:  Procedure Laterality Date  . ABDOMINAL HYSTERECTOMY  1960's  . APPENDECTOMY  1947  . [place]  . BREAST BIOPSY Left 12/17/2016   SINGLE FRAGMENT OF ATYPICAL EPITHELIAL CELLS  . BREAST BIOPSY Left 04/08/2017   [facility]  . BREAST EXCISIONAL BIOPSY Left 01/18/1986   Benign microcalcifications, [employer].  Marland Kitchen BREAST LUMPECTOMY Left 05/26/2017   13 mm,T1c, N0; ER/ PR+, Her 2 neu negative, HIGH RISK by [facility] ;  Surgeon: [name], MD;  Location: [facility] ORS;  Service: General;  Laterality: Left;  . BREAST SURGERY  1988  . CARDIAC CATHETERIZATION  1989  . CATARACT EXTRACTION  1997 and 1998  . COLONOSCOPY WITH PROPOFOL N/A 09/12/2015   Procedure: COLONOSCOPY WITH PROPOFOL;  Surgeon: [name], MD;  Location: [facility] ENDOSCOPY;  Service: Endoscopy;  Laterality: N/A;  . IMPLANTABLE CARDIOVERTER DEFIBRILLATOR (ICD) GENERATOR CHANGE Left 03/29/2019   Procedure: PACEMAKER CHANGE OUT;  Surgeon: [name], MD;  Location: [facility] ORS;  Service: Cardiovascular;  Laterality: Left;  . KIDNEY STONE SURGERY  2018  .  MOHS SURGERY  2019   forehead  . pace maker  2008  . [place]  . TONSILLECTOMY AND ADENOIDECTOMY  1950"s   Family History  Problem Relation Age of Onset  . Stroke Mother   . Diabetes Mother   . Cancer Maternal Aunt        Breast Cancer  . Breast cancer Maternal Aunt   . Arthritis Maternal Grandmother   . Cancer Maternal Aunt        Lung Cancer  . Diabetes Son   . Cancer Son   . Kidney disease Neg Hx   . Bladder Cancer Neg Hx    Social History   Socioeconomic History  . Marital status: Married    Spouse name: Holly Rose  . Number of children: 2  . Years of education: Not on file  . Highest education level:  Not on file  Occupational History  . Not on file  Tobacco Use  . Smoking status: Former Smoker    Years: 5.00    Quit date: 09/14/1968    Years since quitting: 51.9  . Smokeless tobacco: Never Used  . Tobacco comment: quit 1970  Vaping Use  . Vaping Use: Never used  Substance and Sexual Activity  . Alcohol use: No    Alcohol/week: 0.0 standard drinks  . Drug use: No  . Sexual activity: Not on file  Other Topics Concern  . Not on file  Social History Narrative   Live home with husband in [place]   Social Determinants of Health   Financial Resource Strain:   . Difficulty of Paying Living Expenses: Not on file  Food Insecurity:   . Worried About Charity fundraiser in the Last Year: Not on file  . Ran Out of Food in the Last Year: Not on file  Transportation Needs:   . Lack of Transportation (Medical): Not on file  . Lack of Transportation (Non-Medical): Not on file  Physical Activity:   . Days of Exercise per Week: Not on file  . Minutes of Exercise per Session: Not on file  Stress:   . Feeling of Stress : Not on file  Social Connections:   . Frequency of Communication with Friends and Family: Not on file  . Frequency of Social Gatherings with Friends and Family: Not on file  . Attends Religious Services: Not on file  . Active Member of Clubs or Organizations: Not on file  . Attends Archivist Meetings: Not on file  . Marital Status: Not on file    Outpatient Encounter Medications as of 07/26/2020  Medication Sig  . Aflibercept (EYLEA) 2 MG/0.05ML SOLN 1 each by Intravitreal route as directed.   Marland Kitchen alendronate (FOSAMAX) 70 MG tablet Take 1 tablet (70 mg total) by mouth once a week. Take with a full glass of water on an empty stomach.  Marland Kitchen apixaban (ELIQUIS) 2.5 MG TABS tablet Take 1 tablet (2.5 mg total) by mouth 2 (two) times daily.  Marland Kitchen atorvastatin (LIPITOR) 40 MG tablet Take 1 tablet (40 mg total) by mouth daily.  . Biotin w/ Vitamins C & E  (HAIR/SKIN/NAILS PO) Take 3 tablets by mouth daily. 2 in the am, 1 in the pm  . blood glucose meter kit and supplies Per patient please dispense One touch Ultra2 meter.Use meter and supplies three times a day to check blood sugar. ICD10: E11.40  . Blood Glucose Monitoring Suppl (FREESTYLE LITE) DEVI 2 (two) times daily.  . Cholecalciferol (  VITAMIN D) 50 MCG (2000 UT) CAPS Take 4,000 Units by mouth daily.  . divalproex (DEPAKOTE) 125 MG DR tablet Take one tablet q hs for one week and then one tablet bid (Patient taking differently: Take 125 mg by mouth 2 (two) times daily. Take one tablet q hs for one week and then one tablet bid)  . furosemide (LASIX) 20 MG tablet Take 1 tablet (20 mg total) by mouth 2 (two) times daily.  Marland Kitchen glucose blood test strip Use as instructed to check sugars twice daily. Dx E11.9  . isosorbide mononitrate (IMDUR) 30 MG 24 hr tablet Take 1 tablet (30 mg total) by mouth daily.  . Lancets (FREESTYLE) lancets 2 (two) times daily.  . Magnesium 200 MG TABS Take 200 mg by mouth 2 (two) times a day.  . meclizine (ANTIVERT) 12.5 MG tablet Take 1 tablet (12.5 mg total) by mouth 2 (two) times daily as needed for dizziness.  . metFORMIN (GLUCOPHAGE-XR) 500 MG 24 hr tablet Take 1 tablet (500 mg total) by mouth 2 (two) times daily.  . metoprolol tartrate (LOPRESSOR) 25 MG tablet Take 25 mg by mouth 2 (two) times daily.  . mirtazapine (REMERON) 7.5 MG tablet Take 1 tablet (7.5 mg total) by mouth at bedtime.  . Multiple Vitamins-Minerals (PRESERVISION AREDS 2 PO) Take 1 tablet by mouth 2 (two) times daily.   . mupirocin ointment (BACTROBAN) 2 % Apply 1 application topically 2 (two) times daily.  . nitroGLYCERIN (NITROSTAT) 0.4 MG SL tablet Place 0.4 mg under the tongue every 5 (five) minutes as needed for chest pain.   Vladimir Faster Glycol-Propyl Glycol (SYSTANE OP) Apply 1 drop to eye 2 (two) times daily.  . Probiotic Product (PROBIOTIC DAILY PO) Take by mouth.  . tamoxifen (NOLVADEX) 20 MG  tablet Take 1 tablet by mouth once daily (Patient taking differently: Take 20 mg by mouth daily. )  . vitamin B-12 (CYANOCOBALAMIN) 500 MCG tablet Take 500 mcg by mouth 2 (two) times a day.   No facility-administered encounter medications on file as of 07/26/2020.    Review of Systems  Constitutional: Negative for appetite change.       Some weight loss.  Eating.    HENT: Negative for congestion and sinus pressure.   Respiratory: Negative for cough, chest tightness and shortness of breath.   Cardiovascular: Negative for chest pain, palpitations and leg swelling.  Gastrointestinal: Negative for abdominal pain, diarrhea, nausea and vomiting.  Genitourinary: Negative for difficulty urinating and dysuria.  Musculoskeletal: Negative for joint swelling and myalgias.  Skin: Negative for color change and rash.  Neurological: Negative for dizziness, light-headedness and headaches.  Psychiatric/Behavioral: Negative for agitation and dysphoric mood.       Objective:    Physical Exam Vitals reviewed.  Constitutional:      General: She is not in acute distress.    Appearance: Normal appearance.  HENT:     Head: Normocephalic and atraumatic.     Right Ear: External ear normal.     Left Ear: External ear normal.  Eyes:     General: No scleral icterus.       Right eye: No discharge.        Left eye: No discharge.     Conjunctiva/sclera: Conjunctivae normal.  Neck:     Thyroid: No thyromegaly.  Cardiovascular:     Rate and Rhythm: Normal rate and regular rhythm.  Pulmonary:     Effort: No respiratory distress.     Breath sounds: Normal breath  sounds. No wheezing.  Abdominal:     General: Bowel sounds are normal.     Palpations: Abdomen is soft.     Tenderness: There is no abdominal tenderness.  Genitourinary:    Comments: Rectal exam:  Heme positive.   Musculoskeletal:        General: No swelling or tenderness.     Cervical back: Neck supple. No tenderness.  Lymphadenopathy:      Cervical: No cervical adenopathy.  Skin:    Findings: No erythema or rash.  Neurological:     Mental Status: She is alert.  Psychiatric:        Mood and Affect: Mood normal.        Behavior: Behavior normal.     BP 124/70   Pulse 90   Temp 97.8 F (36.6 C) (Oral)   Resp 16   Wt 127 lb (57.6 kg)   LMP  (LMP Unknown)   SpO2 97%   BMI 23.23 kg/m  Wt Readings from Last 3 Encounters:  07/26/20 127 lb (57.6 kg)  07/22/20 130 lb 12.8 oz (59.3 kg)  07/16/20 130 lb 12.8 oz (59.3 kg)     Lab Results  Component Value Date   WBC 3.4 (L) 07/22/2020   HGB 9.5 (L) 07/22/2020   HCT 29.8 (L) 07/22/2020   PLT 171 07/22/2020   GLUCOSE 96 07/22/2020   CHOL 99 07/23/2020   TRIG 60 07/23/2020   HDL 41 07/23/2020   LDLCALC 46 07/23/2020   ALT 28 07/22/2020   AST 37 07/22/2020   NA 139 07/22/2020   K 3.6 07/22/2020   CL 103 07/22/2020   CREATININE 0.79 07/22/2020   BUN 18 07/22/2020   CO2 29 07/22/2020   TSH 1.860 04/15/2020   INR 1.2 07/22/2020   HGBA1C 6.2 (H) 07/23/2020   MICROALBUR 65.4 (H) 03/09/2016    CT ANGIO HEAD W OR WO CONTRAST  Result Date: 07/22/2020 CLINICAL DATA:  Initial evaluation for acute stroke. EXAM: CT ANGIOGRAPHY HEAD AND NECK TECHNIQUE: Multidetector CT imaging of the head and neck was performed using the standard protocol during bolus administration of intravenous contrast. Multiplanar CT image reconstructions and MIPs were obtained to evaluate the vascular anatomy. Carotid stenosis measurements (when applicable) are obtained utilizing NASCET criteria, using the distal internal carotid diameter as the denominator. CONTRAST:  41mL OMNIPAQUE IOHEXOL 350 MG/ML SOLN COMPARISON:  Prior CT from earlier the same day. FINDINGS: CTA NECK FINDINGS Aortic arch: Visualized aortic arch of normal caliber with normal branch pattern. Mild-to-moderate atheromatous change about the arch and origin of the great vessels without hemodynamically significant stenosis. Right carotid  system: Right CCA patent from its origin to the bifurcation without stenosis. Atheromatous change about the right bifurcation without hemodynamically significant stenosis. Right ICA widely patent distally without stenosis, dissection or occlusion. No made of a focal severe stenosis at the origin of the right external carotid artery (series 6, image 155). Left carotid system: Left CCA patent from its origin to the bifurcation without stenosis. Scattered calcified plaque about the left bifurcation/proximal left ICA with associated stenosis of up to 40% by NASCET criteria. Left ICA patent distally to the skull base without stenosis, dissection or occlusion. Vertebral arteries: Both vertebral arteries arise from the subclavian arteries. No proximal subclavian artery stenosis. Vertebral arteries widely patent without stenosis, dissection or occlusion. Skeleton: No acute osseous abnormality. No discrete or worrisome osseous lesions. Moderate cervical spondylosis of present at C5-6 and C6-7. Other neck: No other acute soft tissue abnormality  within the neck. 4 mm right thyroid nodule noted, felt to be of doubtful significance given size and patient age. No follow-up imaging recommended regarding this lesion. No other mass lesion or adenopathy within the neck. Upper chest: Small layering bilateral pleural effusions noted within the visualized lungs. Emphysematous changes noted. Review of the MIP images confirms the above findings CTA HEAD FINDINGS Anterior circulation: Petrous segments patent bilaterally. Mild scattered atheromatous plaque within the carotid siphons without hemodynamically significant stenosis. A1 segments patent bilaterally. Normal anterior communicating artery complex. Anterior cerebral arteries patent to their distal aspects without stenosis. No M1 stenosis or occlusion. Normal MCA bifurcations. Distal MCA branches well perfused and symmetric. Posterior circulation: Vertebral arteries patent to the  vertebrobasilar junction without stenosis. Left vertebral artery dominant. Patent right PICA. Left PICA not seen. Basilar patent to its distal aspect without stenosis. Superior cerebral arteries patent bilaterally. Both PCAs primarily supplied via the basilar. Left PCA well perfused to its distal aspect without significant stenosis. There is a short-segment severe proximal right P2 stenosis (series 7, image 109). The right PCA is attenuated and small but remains patent to its distal aspect. Venous sinuses: Patent. Anatomic variants: None significant.  No aneurysm. Review of the MIP images confirms the above findings IMPRESSION: 1. Negative CTA for large vessel occlusion. 2. Short-segment severe proximal right P2 stenosis. The right PCA is attenuated distally but remains patent. Finding in keeping with the chronic right PCA territory infarct. 3. Short-segment 40% atheromatous stenosis at the origin of the left ICA. 4. Short-segment severe stenosis at the origin of the right external carotid artery. 5. Small layering bilateral pleural effusions. 6. Emphysema (ICD10-J43.9). Electronically Signed   By: Jeannine Boga M.D.   On: 07/22/2020 23:49   CT HEAD WO CONTRAST  Addendum Date: 07/22/2020   ADDENDUM REPORT: 07/22/2020 08:48 ADDENDUM: These results were called by telephone at the time of interpretation on 07/22/2020 at 8:38 am to provider [facility] , who verbally acknowledged these results. Electronically Signed   By: Primitivo Gauze M.D.   On: 07/22/2020 08:48   Result Date: 07/22/2020 CLINICAL DATA:  Fall, confusion and hallucinations. EXAM: CT HEAD WITHOUT CONTRAST TECHNIQUE: Contiguous axial images were obtained from the base of the skull through the vertex without intravenous contrast. COMPARISON:  04/15/2020 head CT and prior. FINDINGS: Brain: New subacute right MCA territory infarct. No intracranial hemorrhage. Chronic right occipital insult. Mild cerebral atrophy with ex vacuo dilatation.  Chronic microvascular ischemic changes. No mass lesion. No midline shift, ventriculomegaly or extra-axial fluid collection. Vascular: No hyperdense vessel or unexpected calcification. Bilateral skull base atherosclerotic calcifications. Skull: Negative for fracture or focal lesion. Sinuses/Orbits: Normal orbits. Clear paranasal sinuses. No mastoid effusion. Other: None. IMPRESSION: New subacute right MCA territory infarct. Mild cerebral atrophy and chronic microvascular ischemic changes. Remote right occipital insult. Electronically Signed: By: Primitivo Gauze M.D. On: 07/22/2020 08:32   CT ANGIO NECK W OR WO CONTRAST  Result Date: 07/22/2020 CLINICAL DATA:  Initial evaluation for acute stroke. EXAM: CT ANGIOGRAPHY HEAD AND NECK TECHNIQUE: Multidetector CT imaging of the head and neck was performed using the standard protocol during bolus administration of intravenous contrast. Multiplanar CT image reconstructions and MIPs were obtained to evaluate the vascular anatomy. Carotid stenosis measurements (when applicable) are obtained utilizing NASCET criteria, using the distal internal carotid diameter as the denominator. CONTRAST:  57m OMNIPAQUE IOHEXOL 350 MG/ML SOLN COMPARISON:  Prior CT from earlier the same day. FINDINGS: CTA NECK FINDINGS Aortic arch: Visualized aortic arch of  normal caliber with normal branch pattern. Mild-to-moderate atheromatous change about the arch and origin of the great vessels without hemodynamically significant stenosis. Right carotid system: Right CCA patent from its origin to the bifurcation without stenosis. Atheromatous change about the right bifurcation without hemodynamically significant stenosis. Right ICA widely patent distally without stenosis, dissection or occlusion. No made of a focal severe stenosis at the origin of the right external carotid artery (series 6, image 155). Left carotid system: Left CCA patent from its origin to the bifurcation without stenosis.  Scattered calcified plaque about the left bifurcation/proximal left ICA with associated stenosis of up to 40% by NASCET criteria. Left ICA patent distally to the skull base without stenosis, dissection or occlusion. Vertebral arteries: Both vertebral arteries arise from the subclavian arteries. No proximal subclavian artery stenosis. Vertebral arteries widely patent without stenosis, dissection or occlusion. Skeleton: No acute osseous abnormality. No discrete or worrisome osseous lesions. Moderate cervical spondylosis of present at C5-6 and C6-7. Other neck: No other acute soft tissue abnormality within the neck. 4 mm right thyroid nodule noted, felt to be of doubtful significance given size and patient age. No follow-up imaging recommended regarding this lesion. No other mass lesion or adenopathy within the neck. Upper chest: Small layering bilateral pleural effusions noted within the visualized lungs. Emphysematous changes noted. Review of the MIP images confirms the above findings CTA HEAD FINDINGS Anterior circulation: Petrous segments patent bilaterally. Mild scattered atheromatous plaque within the carotid siphons without hemodynamically significant stenosis. A1 segments patent bilaterally. Normal anterior communicating artery complex. Anterior cerebral arteries patent to their distal aspects without stenosis. No M1 stenosis or occlusion. Normal MCA bifurcations. Distal MCA branches well perfused and symmetric. Posterior circulation: Vertebral arteries patent to the vertebrobasilar junction without stenosis. Left vertebral artery dominant. Patent right PICA. Left PICA not seen. Basilar patent to its distal aspect without stenosis. Superior cerebral arteries patent bilaterally. Both PCAs primarily supplied via the basilar. Left PCA well perfused to its distal aspect without significant stenosis. There is a short-segment severe proximal right P2 stenosis (series 7, image 109). The right PCA is attenuated and  small but remains patent to its distal aspect. Venous sinuses: Patent. Anatomic variants: None significant.  No aneurysm. Review of the MIP images confirms the above findings IMPRESSION: 1. Negative CTA for large vessel occlusion. 2. Short-segment severe proximal right P2 stenosis. The right PCA is attenuated distally but remains patent. Finding in keeping with the chronic right PCA territory infarct. 3. Short-segment 40% atheromatous stenosis at the origin of the left ICA. 4. Short-segment severe stenosis at the origin of the right external carotid artery. 5. Small layering bilateral pleural effusions. 6. Emphysema (ICD10-J43.9). Electronically Signed   By: Jeannine Boga M.D.   On: 07/22/2020 23:49       Assessment & Plan:   Problem List Items Addressed This Visit    Vitamin D deficiency, unspecified    Follow vitamin  D level.        Visual hallucinations    On depakote and remeron.  Stable.  Still with visual hallucinations.  Continue f/u with neurology.        Type II diabetes mellitus ([place])    Follow met b and a1c.  On metformin.        Relevant Orders   Basic metabolic panel   Malignant neoplasm of left female breast ([place])    S/p XRT.  On tamoxifien.        Iron deficiency anemia - Primary  Recheck cbc.  hgb decreased in hospital.  Previously required iron infusions.        Relevant Orders   CBC with Differential/Platelet   Hyperlipidemia    On lipitor.  Follow lipid panel and liver function tests.        Diabetes mellitus with neuropathy ([facility])    Follow met b and a1c.        CAD (coronary artery disease)    Recently admitted with NSTEMI.  Elected Conservation officer, nature.  Continue toprol, isosorbide.        Atrial fibrillation with RVR ([place])    On eliquis now.  Rate controlled.  Follow.            [name], MD

## 2020-07-27 ENCOUNTER — Encounter: Payer: Self-pay | Admitting: Internal Medicine

## 2020-07-27 NOTE — Assessment & Plan Note (Signed)
Recheck cbc.  hgb decreased in hospital.  Previously required iron infusions.

## 2020-07-27 NOTE — Assessment & Plan Note (Signed)
On depakote and remeron.  Stable.  Still with visual hallucinations.  Continue f/u with neurology.

## 2020-07-27 NOTE — Assessment & Plan Note (Signed)
On lipitor.  Follow lipid panel and liver function tests.   

## 2020-07-27 NOTE — Assessment & Plan Note (Signed)
On eliquis now.  Rate controlled.  Follow.

## 2020-07-27 NOTE — Assessment & Plan Note (Signed)
Follow vitamin D level.  

## 2020-07-27 NOTE — Assessment & Plan Note (Signed)
Follow met b and a1c.  On metformin.

## 2020-07-27 NOTE — Assessment & Plan Note (Signed)
Recently admitted with NSTEMI.  Elected Conservation officer, nature.  Continue toprol, isosorbide.

## 2020-07-27 NOTE — Assessment & Plan Note (Signed)
S/p XRT.  On tamoxifien.

## 2020-07-27 NOTE — Assessment & Plan Note (Signed)
Follow met b and a1c.

## 2020-07-29 ENCOUNTER — Other Ambulatory Visit (INDEPENDENT_AMBULATORY_CARE_PROVIDER_SITE_OTHER): Payer: Medicare Other

## 2020-07-29 ENCOUNTER — Other Ambulatory Visit: Payer: Self-pay

## 2020-07-29 DIAGNOSIS — Z23 Encounter for immunization: Secondary | ICD-10-CM | POA: Diagnosis not present

## 2020-07-29 DIAGNOSIS — H353211 Exudative age-related macular degeneration, right eye, with active choroidal neovascularization: Secondary | ICD-10-CM | POA: Diagnosis not present

## 2020-07-29 DIAGNOSIS — E1165 Type 2 diabetes mellitus with hyperglycemia: Secondary | ICD-10-CM | POA: Diagnosis not present

## 2020-07-29 DIAGNOSIS — D509 Iron deficiency anemia, unspecified: Secondary | ICD-10-CM

## 2020-07-29 LAB — BASIC METABOLIC PANEL
BUN: 21 mg/dL (ref 6–23)
CO2: 28 mEq/L (ref 19–32)
Calcium: 8.7 mg/dL (ref 8.4–10.5)
Chloride: 101 mEq/L (ref 96–112)
Creatinine, Ser: 0.88 mg/dL (ref 0.40–1.20)
GFR: 58.56 mL/min — ABNORMAL LOW (ref >60.00)
Glucose, Bld: 107 mg/dL — ABNORMAL HIGH (ref 70–99)
Potassium: 3.8 mEq/L (ref 3.5–5.1)
Sodium: 137 mEq/L (ref 135–145)

## 2020-07-29 LAB — CBC WITH DIFFERENTIAL/PLATELET
Basophils Absolute: 0 10*3/uL (ref 0.0–0.1)
Basophils Relative: 0.7 % (ref 0.0–3.0)
Eosinophils Absolute: 0 10*3/uL (ref 0.0–0.7)
Eosinophils Relative: 0.5 % (ref 0.0–5.0)
HCT: 29.8 % — ABNORMAL LOW (ref 36.0–46.0)
Hemoglobin: 9.9 g/dL — ABNORMAL LOW (ref 12.0–15.0)
Lymphocytes Relative: 18.9 % (ref 12.0–46.0)
Lymphs Abs: 0.7 10*3/uL (ref 0.7–4.0)
MCHC: 33.1 g/dL (ref 30.0–36.0)
MCV: 91.4 fl (ref 78.0–100.0)
Monocytes Absolute: 0.3 10*3/uL (ref 0.1–1.0)
Monocytes Relative: 8.3 % (ref 3.0–12.0)
Neutro Abs: 2.8 10*3/uL (ref 1.4–7.7)
Neutrophils Relative %: 71.6 % (ref 43.0–77.0)
Platelets: 140 10*3/uL — ABNORMAL LOW (ref 150.0–400.0)
RBC: 3.26 Mil/uL — ABNORMAL LOW (ref 3.87–5.11)
RDW: 17 % — ABNORMAL HIGH (ref 11.5–15.5)
WBC: 3.9 10*3/uL — ABNORMAL LOW (ref 4.0–10.5)

## 2020-07-30 ENCOUNTER — Encounter: Payer: Self-pay | Admitting: Internal Medicine

## 2020-07-30 ENCOUNTER — Other Ambulatory Visit: Payer: Self-pay | Admitting: Internal Medicine

## 2020-07-30 DIAGNOSIS — D649 Anemia, unspecified: Secondary | ICD-10-CM

## 2020-07-30 DIAGNOSIS — H353221 Exudative age-related macular degeneration, left eye, with active choroidal neovascularization: Secondary | ICD-10-CM | POA: Diagnosis not present

## 2020-07-30 NOTE — Progress Notes (Signed)
Order placed for f/u labs.  

## 2020-07-31 ENCOUNTER — Other Ambulatory Visit: Payer: Self-pay | Admitting: Internal Medicine

## 2020-07-31 DIAGNOSIS — R195 Other fecal abnormalities: Secondary | ICD-10-CM

## 2020-07-31 DIAGNOSIS — D509 Iron deficiency anemia, unspecified: Secondary | ICD-10-CM

## 2020-07-31 NOTE — Progress Notes (Signed)
Order placed for GI referral.   

## 2020-07-31 NOTE — Progress Notes (Signed)
Order placed for hematology referral.  

## 2020-08-06 DIAGNOSIS — I69319 Unspecified symptoms and signs involving cognitive functions following cerebral infarction: Secondary | ICD-10-CM | POA: Diagnosis not present

## 2020-08-06 DIAGNOSIS — R410 Disorientation, unspecified: Secondary | ICD-10-CM | POA: Diagnosis not present

## 2020-08-06 DIAGNOSIS — R441 Visual hallucinations: Secondary | ICD-10-CM | POA: Diagnosis not present

## 2020-08-06 DIAGNOSIS — F0150 Vascular dementia without behavioral disturbance: Secondary | ICD-10-CM | POA: Diagnosis not present

## 2020-08-13 DIAGNOSIS — E1169 Type 2 diabetes mellitus with other specified complication: Secondary | ICD-10-CM | POA: Diagnosis not present

## 2020-08-13 DIAGNOSIS — I214 Non-ST elevation (NSTEMI) myocardial infarction: Secondary | ICD-10-CM | POA: Diagnosis not present

## 2020-08-13 DIAGNOSIS — E785 Hyperlipidemia, unspecified: Secondary | ICD-10-CM | POA: Diagnosis not present

## 2020-08-13 DIAGNOSIS — I495 Sick sinus syndrome: Secondary | ICD-10-CM | POA: Diagnosis not present

## 2020-08-13 DIAGNOSIS — G459 Transient cerebral ischemic attack, unspecified: Secondary | ICD-10-CM | POA: Diagnosis not present

## 2020-08-13 DIAGNOSIS — I341 Nonrheumatic mitral (valve) prolapse: Secondary | ICD-10-CM | POA: Diagnosis not present

## 2020-08-13 DIAGNOSIS — R413 Other amnesia: Secondary | ICD-10-CM | POA: Diagnosis not present

## 2020-08-13 DIAGNOSIS — I4811 Longstanding persistent atrial fibrillation: Secondary | ICD-10-CM | POA: Diagnosis not present

## 2020-08-15 ENCOUNTER — Other Ambulatory Visit: Payer: Medicare Other

## 2020-08-19 ENCOUNTER — Encounter: Payer: Self-pay | Admitting: Podiatry

## 2020-08-19 ENCOUNTER — Ambulatory Visit (INDEPENDENT_AMBULATORY_CARE_PROVIDER_SITE_OTHER): Payer: Medicare Other | Admitting: Podiatry

## 2020-08-19 ENCOUNTER — Ambulatory Visit: Payer: Medicare Other | Admitting: Podiatry

## 2020-08-19 ENCOUNTER — Other Ambulatory Visit: Payer: Self-pay

## 2020-08-19 DIAGNOSIS — E1142 Type 2 diabetes mellitus with diabetic polyneuropathy: Secondary | ICD-10-CM | POA: Diagnosis not present

## 2020-08-19 DIAGNOSIS — L97521 Non-pressure chronic ulcer of other part of left foot limited to breakdown of skin: Secondary | ICD-10-CM

## 2020-08-19 DIAGNOSIS — L97511 Non-pressure chronic ulcer of other part of right foot limited to breakdown of skin: Secondary | ICD-10-CM

## 2020-08-19 NOTE — Progress Notes (Signed)
She presents today for follow-up of her ulcerations with her daughter-in-law states that seems to be doing better.  Objective: Vital signs stable alert and oriented x3 there is no erythema edema cellulitis drainage odor reactive hyperkeratotic lesion plantar aspect forefoot bilateral completely close no signs of infection.  Appears to be improving each visit.  Assessment: Chronic ulceration forefoot left.  Plan: Debridement of all reactive hyperkeratotic tissue bilaterally.

## 2020-08-22 DIAGNOSIS — L565 Disseminated superficial actinic porokeratosis (DSAP): Secondary | ICD-10-CM | POA: Diagnosis not present

## 2020-08-22 DIAGNOSIS — Z85828 Personal history of other malignant neoplasm of skin: Secondary | ICD-10-CM | POA: Diagnosis not present

## 2020-08-22 DIAGNOSIS — Z08 Encounter for follow-up examination after completed treatment for malignant neoplasm: Secondary | ICD-10-CM | POA: Diagnosis not present

## 2020-08-22 DIAGNOSIS — L218 Other seborrheic dermatitis: Secondary | ICD-10-CM | POA: Diagnosis not present

## 2020-08-23 ENCOUNTER — Other Ambulatory Visit: Payer: Self-pay | Admitting: Oncology

## 2020-08-23 ENCOUNTER — Inpatient Hospital Stay: Payer: Medicare Other | Attending: Oncology

## 2020-08-23 ENCOUNTER — Other Ambulatory Visit: Payer: Self-pay | Admitting: *Deleted

## 2020-08-23 ENCOUNTER — Other Ambulatory Visit: Payer: Self-pay

## 2020-08-23 ENCOUNTER — Inpatient Hospital Stay (HOSPITAL_BASED_OUTPATIENT_CLINIC_OR_DEPARTMENT_OTHER): Payer: Medicare Other | Admitting: Oncology

## 2020-08-23 VITALS — BP 104/61 | HR 78 | Temp 97.4°F | Resp 16 | Wt 127.6 lb

## 2020-08-23 DIAGNOSIS — I2510 Atherosclerotic heart disease of native coronary artery without angina pectoris: Secondary | ICD-10-CM

## 2020-08-23 DIAGNOSIS — D649 Anemia, unspecified: Secondary | ICD-10-CM | POA: Diagnosis not present

## 2020-08-23 DIAGNOSIS — D509 Iron deficiency anemia, unspecified: Secondary | ICD-10-CM | POA: Diagnosis not present

## 2020-08-23 LAB — COMPREHENSIVE METABOLIC PANEL
ALT: 25 U/L (ref 0–44)
AST: 34 U/L (ref 15–41)
Albumin: 3.2 g/dL — ABNORMAL LOW (ref 3.5–5.0)
Alkaline Phosphatase: 33 U/L — ABNORMAL LOW (ref 38–126)
Anion gap: 11 (ref 5–15)
BUN: 23 mg/dL (ref 8–23)
CO2: 26 mmol/L (ref 22–32)
Calcium: 9.6 mg/dL (ref 8.9–10.3)
Chloride: 100 mmol/L (ref 98–111)
Creatinine, Ser: 0.61 mg/dL (ref 0.44–1.00)
GFR, Estimated: >60 mL/min (ref >60)
Glucose, Bld: 133 mg/dL — ABNORMAL HIGH (ref 70–99)
Potassium: 3.5 mmol/L (ref 3.5–5.1)
Sodium: 137 mmol/L (ref 135–145)
Total Bilirubin: 0.5 mg/dL (ref 0.3–1.2)
Total Protein: 7 g/dL (ref 6.5–8.1)

## 2020-08-23 LAB — CBC WITH DIFFERENTIAL/PLATELET
Abs Immature Granulocytes: 0.01 10*3/uL (ref 0.00–0.07)
Basophils Absolute: 0 10*3/uL (ref 0.0–0.1)
Basophils Relative: 1 %
Eosinophils Absolute: 0 10*3/uL (ref 0.0–0.5)
Eosinophils Relative: 0 %
HCT: 30.5 % — ABNORMAL LOW (ref 36.0–46.0)
Hemoglobin: 9.9 g/dL — ABNORMAL LOW (ref 12.0–15.0)
Immature Granulocytes: 0 %
Lymphocytes Relative: 20 %
Lymphs Abs: 0.7 10*3/uL (ref 0.7–4.0)
MCH: 30.7 pg (ref 26.0–34.0)
MCHC: 32.5 g/dL (ref 30.0–36.0)
MCV: 94.7 fL (ref 80.0–100.0)
Monocytes Absolute: 0.3 10*3/uL (ref 0.1–1.0)
Monocytes Relative: 9 %
Neutro Abs: 2.5 10*3/uL (ref 1.7–7.7)
Neutrophils Relative %: 70 %
Platelets: 143 10*3/uL — ABNORMAL LOW (ref 150–400)
RBC: 3.22 MIL/uL — ABNORMAL LOW (ref 3.87–5.11)
RDW: 16 % — ABNORMAL HIGH (ref 11.5–15.5)
WBC: 3.6 10*3/uL — ABNORMAL LOW (ref 4.0–10.5)
nRBC: 0 % (ref 0.0–0.2)

## 2020-08-23 LAB — FERRITIN: Ferritin: 14 ng/mL (ref 11–307)

## 2020-08-23 NOTE — Progress Notes (Signed)
Established pt being seen for new diagnosis of IDA. Lives independently with her husband at [place]. Saw hematologist at [facility] in the past. Has IDA at present. Started taking oral iron 2 weeks ago. Energy is about the usual for her. Cooks meals. Pt very animated and talkative this morning. No blood in stool. Had a GI appt with PA Woodard at [facility] scheduled for Monday but daughter pushed it out to Jan 19th, so hemotology could see her 1st.

## 2020-08-23 NOTE — Progress Notes (Signed)
Symptom Management Consult note [facility]  Telephone:([phone] [phone]  Patient Care Team: [name], MD as PCP - General (Internal Medicine) [name], MD (Internal Medicine) Bary Castilla Forest Gleason, MD (General Surgery) [name], MD as Consulting Physician (Oncology)   Name of the patient: Holly Rose  [id number]  April 20, 1930   Date of visit: 08/23/2020   Diagnosis- IDA  Chief complaint/ Reason for visit- IDA  Heme/Onc history:  Oncology History   No history exists.   Interval history-[name] is a 86 year old female with past medical history significant for CAD, A. fib on anticoagulation, non-STEMI, TIA, GI bleed, diverticulosis, diabetes, dementia who was referred to hematology for normocytic anemia.  Per chart review, hemoglobin began to trend down from August 2021 to current.  Hemoglobin range from normal to 9.6.  Imaging of her abdomen from 03/27/2016 showed hepatic steatosis, hepatic and renal cyst that appeared stable, stable right ovarian cyst, diverticulosis and nonobstructing left renal calculus.  normal spleen and gallbladder.  She presents with her daughter.  She denies any recent chest pain, shortness of breath, presyncopal episodes or palpitations.  She has not noticed any recent bleeding such as epistasis, hematuria or hematochezia.  She denies any use of NSAID ingestion.  She is on Eliquis for atrial fibrillation with RVR.  Her last colonoscopy was in 2016 which showed diverticulosis, several polyps and internal hemorrhoids. She had no prior history or diagnosis of cancer. Her age appropriate screening programs are up-to-date. She denies any pica and eats a variety of diet. She never donated blood or received blood transfusion.    ECOG FS:2 - Symptomatic, <50% confined to bed  Review of systems- Review of Systems  Constitutional: Positive for malaise/fatigue. Negative for chills, fever and weight  loss.  HENT: Negative for congestion, ear pain and tinnitus.   Eyes: Negative.  Negative for blurred vision and double vision.  Respiratory: Negative.  Negative for cough, sputum production and shortness of breath.   Cardiovascular: Negative.  Negative for chest pain, palpitations and leg swelling.  Gastrointestinal: Negative.  Negative for abdominal pain, constipation, diarrhea, nausea and vomiting.  Genitourinary: Negative for dysuria, frequency and urgency.  Musculoskeletal: Negative for back pain and falls.  Skin: Negative.  Negative for rash.  Neurological: Positive for weakness. Negative for headaches.  Endo/Heme/Allergies: Negative.  Does not bruise/bleed easily.  Psychiatric/Behavioral: Negative.  Negative for depression. The patient is not nervous/anxious and does not have insomnia.      Current treatment- None at this time  Allergies  Allergen Reactions  . Lyrica [Pregabalin] Swelling    Sleepy, does not eat  . Neurontin [Gabapentin] Nausea And Vomiting and Other (See Comments)    disoriented  . Bactrim [Sulfamethoxazole-Trimethoprim]     Made her dizziness     Past Medical History:  Diagnosis Date  . Allergy   . Anemia   . Arthritis   . Atrial fibrillation ([place]) 2004  . Breast cancer, left ([place]) 10/2017   Lumpectomy and rad tx's.   Marland Kitchen CAD (coronary artery disease)   . Complication of anesthesia    hard to wake up with general anesthesia  . Diabetes ([place])   . Dysrhythmia    A-fib  . Heart attack ([place]) 2008  . Heart murmur   . History of kidney stones   . History of sick sinus syndrome    s/p pacemaker placement  . Hyperlipidemia   . Macular degeneration   . Neuropathy   .  Neuropathy   . Pacemaker 06/2010  . Personal history of radiation therapy 11/2017   LEFT lumpectomy   . Squamous cell skin cancer   . TIA (transient ischemic attack)      Past Surgical History:  Procedure Laterality Date  . ABDOMINAL HYSTERECTOMY  1960's  . APPENDECTOMY  1947  .  [place]  . BREAST BIOPSY Left 12/17/2016   SINGLE FRAGMENT OF ATYPICAL EPITHELIAL CELLS  . BREAST BIOPSY Left 04/08/2017   [facility]  . BREAST EXCISIONAL BIOPSY Left 01/18/1986   Benign microcalcifications, [employer].  Marland Kitchen BREAST LUMPECTOMY Left 05/26/2017   13 mm,T1c, N0; ER/ PR+, Her 2 neu negative, HIGH RISK by [place] ;  Surgeon: [name], MD;  Location: [facility] ORS;  Service: General;  Laterality: Left;  . BREAST SURGERY  1988  . CARDIAC CATHETERIZATION  1989  . CATARACT EXTRACTION  1997 and 1998  . COLONOSCOPY WITH PROPOFOL N/A 09/12/2015   Procedure: COLONOSCOPY WITH PROPOFOL;  Surgeon: [name], MD;  Location: [facility] END[facility]COPY;  Service: Endoscopy;  Laterality: N/A;  . IMPLANTABLE CARDIOVERTER DEFIBRILLATOR (ICD) GENERATOR CHANGE Left 03/29/2019   Procedure: PACEMAKER CHANGE OUT;  Surgeon: [name], MD;  Location: [facility] ORS;  Service: Cardiovascular;  Laterality: Left;  . KIDNEY STONE SURGERY  2018  . MOHS SURGERY  2019   forehead  . pace maker  2008  . [place]  . TONSILLECTOMY AND ADENOIDECTOMY  1950"s    Social History   Socioeconomic History  . Marital status: Married    Spouse name: [name]  . Number of children: 2  . Years of education: Not on file  . Highest education level: Not on file  Occupational History  . Not on file  Tobacco Use  . Smoking status: Former Smoker    Years: 5.00    Quit date: 09/14/1968    Years since quitting: 51.9  . Smokeless tobacco: Never Used  . Tobacco comment: quit 1970  Vaping Use  . Vaping Use: Never used  Substance and Sexual Activity  . Alcohol use: No    Alcohol/week: 0.0 standard drinks  . Drug use: No  . Sexual activity: Not on file  Other Topics Concern  . Not on file  Social History Narrative   Live home with husband in [place]   Social Determinants of Health   Financial Resource Strain: Not on file  Food Insecurity: Not on file   Transportation Needs: Not on file  Physical Activity: Not on file  Stress: Not on file  Social Connections: Not on file  Intimate Partner Violence: Not on file    Family History  Problem Relation Age of Onset  . Stroke Mother   . Diabetes Mother   . Cancer Maternal Aunt        Breast Cancer  . Breast cancer Maternal Aunt   . Arthritis Maternal Grandmother   . Cancer Maternal Aunt        Lung Cancer  . Diabetes Son   . Cancer Son   . Kidney disease Neg Hx   . Bladder Cancer Neg Hx      Current Outpatient Medications:  .  Aflibercept 2 MG/0.05ML SOLN, 1 each by Intravitreal route as directed. , Disp: , Rfl:  .  alendronate (FOSAMAX) 70 MG tablet, Take 1 tablet (70 mg total) by mouth once a week. Take with a full glass of water on an empty stomach., Disp: 12  tablet, Rfl: 3 .  apixaban (ELIQUIS) 2.5 MG TABS tablet, Take 1 tablet (2.5 mg total) by mouth 2 (two) times daily., Disp: 60 tablet, Rfl: 1 .  atorvastatin (LIPITOR) 40 MG tablet, Take 1 tablet (40 mg total) by mouth daily., Disp: 30 tablet, Rfl: 0 .  Biotin w/ Vitamins C & E (HAIR/SKIN/NAILS PO), Take 3 tablets by mouth daily. 2 in the am, 1 in the pm, Disp: , Rfl:  .  blood glucose meter kit and supplies, Per patient please dispense One touch Ultra2 meter.Use meter and supplies three times a day to check blood sugar. ICD10: E11.40, Disp: 1 each, Rfl: 0 .  Blood Glucose Monitoring Suppl (FREESTYLE LITE) DEVI, 2 (two) times daily., Disp: , Rfl:  .  Cholecalciferol (VITAMIN D) 50 MCG (2000 UT) CAPS, Take 4,000 Units by mouth daily., Disp: , Rfl:  .  divalproex (DEPAKOTE) 125 MG DR tablet, Take one tablet q hs for one week and then one tablet bid (Patient taking differently: Take 125 mg by mouth at bedtime. Takes 1 tablet at bedtime only), Disp: 60 tablet, Rfl: 1 .  furosemide (LASIX) 20 MG tablet, Take 1 tablet (20 mg total) by mouth 2 (two) times daily., Disp: 30 tablet, Rfl:  .  glucose blood test strip, Use as instructed to  check sugars twice daily. Dx E11.9, Disp: 100 each, Rfl: 12 .  isosorbide mononitrate (IMDUR) 30 MG 24 hr tablet, Take 1 tablet (30 mg total) by mouth daily., Disp: 30 tablet, Rfl: 0 .  Lancets (FREESTYLE) lancets, 2 (two) times daily., Disp: , Rfl:  .  Magnesium 200 MG TABS, Take 200 mg by mouth 2 (two) times a day., Disp: , Rfl:  .  meclizine (ANTIVERT) 12.5 MG tablet, Take 1 tablet (12.5 mg total) by mouth 2 (two) times daily as needed for dizziness., Disp: 14 tablet, Rfl: 0 .  metFORMIN (GLUCOPHAGE-XR) 500 MG 24 hr tablet, Take 1 tablet (500 mg total) by mouth 2 (two) times daily., Disp: 90 tablet, Rfl: 1 .  metoprolol tartrate (LOPRESSOR) 25 MG tablet, Take 25 mg by mouth 2 (two) times daily., Disp: , Rfl:  .  mirtazapine (REMERON) 7.5 MG tablet, TAKE 1 TABLET BY MOUTH AT BEDTIME, Disp: 90 tablet, Rfl: 0 .  Multiple Vitamins-Minerals (PRESERVISION AREDS 2 PO), Take 1 tablet by mouth 2 (two) times daily. , Disp: , Rfl:  .  mupirocin ointment (BACTROBAN) 2 %, Apply 1 application topically 2 (two) times daily., Disp: 30 g, Rfl: 1 .  OVER THE COUNTER MEDICATION, Take 2 tablets by mouth in the morning and at bedtime. Energizing iron tablets, Disp: , Rfl:  .  Polyethyl Glycol-Propyl Glycol (SYSTANE OP), Apply 1 drop to eye 2 (two) times daily., Disp: , Rfl:  .  Probiotic Product (PROBIOTIC DAILY PO), Take by mouth., Disp: , Rfl:  .  tamoxifen (NOLVADEX) 20 MG tablet, Take 1 tablet by mouth once daily (Patient taking differently: Take 20 mg by mouth daily.), Disp: 90 tablet, Rfl: 3 .  vitamin B-12 (CYANOCOBALAMIN) 500 MCG tablet, Take 500 mcg by mouth 2 (two) times a day., Disp: , Rfl:  .  nitroGLYCERIN (NITROSTAT) 0.4 MG SL tablet, Place 0.4 mg under the tongue every 5 (five) minutes as needed for chest pain.  (Patient not taking: Reported on 08/23/2020), Disp: , Rfl:   Physical exam:  Vitals:   08/23/20 1123  BP: 104/61  Pulse: 78  Resp: 16  Temp: (!) 97.4 F (36.3 C)  TempSrc: Tympanic  SpO2: 98%  Weight: 127 lb 9.6 oz (57.9 kg)   Physical Exam Constitutional:      General: Vital signs are normal.     Appearance: Normal appearance.  HENT:     Head: Normocephalic and atraumatic.  Eyes:     Pupils: Pupils are equal, round, and reactive to light.  Cardiovascular:     Rate and Rhythm: Normal rate and regular rhythm.     Heart sounds: Normal heart sounds. No murmur heard.   Pulmonary:     Effort: Pulmonary effort is normal.     Breath sounds: Normal breath sounds. No wheezing.  Abdominal:     General: Bowel sounds are normal. There is no distension.     Palpations: Abdomen is soft.     Tenderness: There is no abdominal tenderness.  Musculoskeletal:        General: No edema. Normal range of motion.     Cervical back: Normal range of motion.  Skin:    General: Skin is warm and dry.     Findings: No rash.  Neurological:     Mental Status: She is alert and oriented to person, place, and time.  Psychiatric:        Judgment: Judgment normal.      CMP Latest Ref Rng & Units 08/23/2020  Glucose 70 - 99 mg/dL 133(H)  BUN 8 - 23 mg/dL 23  Creatinine 0.44 - 1.00 mg/dL 0.61  Sodium 135 - 145 mmol/L 137  Potassium 3.5 - 5.1 mmol/L 3.5  Chloride 98 - 111 mmol/L 100  CO2 22 - 32 mmol/L 26  Calcium 8.9 - 10.3 mg/dL 9.6  Total Protein 6.5 - 8.1 g/dL 7.0  Total Bilirubin 0.3 - 1.2 mg/dL 0.5  Alkaline Phos 38 - 126 U/L 33(L)  AST 15 - 41 U/L 34  ALT 0 - 44 U/L 25   CBC Latest Ref Rng & Units 08/23/2020  WBC 4.0 - 10.5 K/uL 3.6(L)  Hemoglobin 12.0 - 15.0 g/dL 9.9(L)  Hematocrit 36.0 - 46.0 % 30.5(L)  Platelets 150 - 400 K/uL 143(L)    No images are attached to the encounter.  No results found.   Assessment and plan- Patient is a 86 y.o. female who presents for work-up of anemia.  Normocytic anemia: -Since August 2021. -Most recent hemoglobin is 9.9. -No active bleed per patient. -Last colonoscopy was in 2016-showed diverticulosis and several  polyps. -Initial iron studies show saturation of 11%, ferritin 21-normal folate level and elevated vitamin B12 level. -Has fatigue but denies any pica. -Recommend repeat lab work including CBC, CMP, ferritin, vitamin B12, folate, copper and iron panel. -Given her age would recommend SPEP. -Hemolysis work-up including reticulocyte count, LDH and peripheral blood smear  Neutropenia and mild thrombocytopenia: -Neutropenia appears to be present on the past several lab draws beginning August 2021 ranging from 3.6 to normal.  ANC normal. -Platelet count averaging around 140,000. -Peripheral blood smear as above -Rule out vitamin deficiencies -SPEP as above. -Reviewed medications -No history of autoimmune disease.  Disposition: -Repeat labs prior to next visit.  -RTC in 1 to 2 months to discuss results.  -She has follow-up with GI in the beginning of January 2022.  Visit Diagnosis 1. Anemia, unspecified type     Patient expressed understanding and was in agreement with this plan. She also understands that She can call clinic at any time with any questions, concerns, or complaints.   Greater than 50% was spent in counseling and coordination of care with this  patient including but not limited to discussion of the relevant topics above (See A&P) including, but not limited to diagnosis and management of acute and chronic medical conditions.   Thank you for allowing me to participate in the care of this very pleasant patient.    Jacquelin Hawking, NP [place] [facility] Cell - [phone] Pager- [phone] 08/31/2020 8:06 AM

## 2020-09-18 DIAGNOSIS — R410 Disorientation, unspecified: Secondary | ICD-10-CM | POA: Diagnosis not present

## 2020-09-18 DIAGNOSIS — R441 Visual hallucinations: Secondary | ICD-10-CM | POA: Diagnosis not present

## 2020-09-18 DIAGNOSIS — R2689 Other abnormalities of gait and mobility: Secondary | ICD-10-CM | POA: Diagnosis not present

## 2020-09-18 DIAGNOSIS — I69319 Unspecified symptoms and signs involving cognitive functions following cerebral infarction: Secondary | ICD-10-CM | POA: Diagnosis not present

## 2020-09-18 DIAGNOSIS — F0150 Vascular dementia without behavioral disturbance: Secondary | ICD-10-CM | POA: Diagnosis not present

## 2020-09-18 DIAGNOSIS — E1142 Type 2 diabetes mellitus with diabetic polyneuropathy: Secondary | ICD-10-CM | POA: Diagnosis not present

## 2020-09-18 DIAGNOSIS — I214 Non-ST elevation (NSTEMI) myocardial infarction: Secondary | ICD-10-CM | POA: Diagnosis not present

## 2020-09-19 DIAGNOSIS — F5105 Insomnia due to other mental disorder: Secondary | ICD-10-CM | POA: Diagnosis not present

## 2020-09-19 DIAGNOSIS — F29 Unspecified psychosis not due to a substance or known physiological condition: Secondary | ICD-10-CM | POA: Diagnosis not present

## 2020-09-19 DIAGNOSIS — F39 Unspecified mood [affective] disorder: Secondary | ICD-10-CM | POA: Diagnosis not present

## 2020-09-23 DIAGNOSIS — H353211 Exudative age-related macular degeneration, right eye, with active choroidal neovascularization: Secondary | ICD-10-CM | POA: Diagnosis not present

## 2020-09-24 DIAGNOSIS — I495 Sick sinus syndrome: Secondary | ICD-10-CM | POA: Diagnosis not present

## 2020-10-02 DIAGNOSIS — D509 Iron deficiency anemia, unspecified: Secondary | ICD-10-CM | POA: Diagnosis not present

## 2020-10-08 ENCOUNTER — Encounter: Payer: Self-pay | Admitting: Internal Medicine

## 2020-10-08 NOTE — Telephone Encounter (Signed)
Please call [name] at [phone]

## 2020-10-08 NOTE — Telephone Encounter (Signed)
Spoken to daughter but unable to reach care giver, was given the number [phone] by daughter to reach patient/caregiver.Phone is currently busy. 

## 2020-10-08 NOTE — Telephone Encounter (Signed)
Holly Rose called you back

## 2020-10-09 DIAGNOSIS — Z20828 Contact with and (suspected) exposure to other viral communicable diseases: Secondary | ICD-10-CM | POA: Diagnosis not present

## 2020-10-09 NOTE — Telephone Encounter (Signed)
Agree with covid testing.  For her runny nose  - nasacort nasal spray 2 sprays each nostril one time per day.  Tylenol prn.  Keep [place] posted on how doing and test results.  Depending on when symptoms started - may be candidate for outpt covid treatment.  May want to hold on keeping grandchild until above sorted through

## 2020-10-09 NOTE — Telephone Encounter (Signed)
This is just an FYI for you. Just wanted you to know what was going on. Spoke with Holly Rose. She has not got update on [name] today but [name] and her husband was being tested this morning by the DON at [facility]. As of last night, pt had runny nose, low grade fever, scratchy throat but still acting her normal self. Holly Rose is going to call for update today and let me know. If symptoms change or worsen she will take her to urgent care. Holly Rose is concerned about COVID because she is supposed to be keeping her small grandchild beginning of next week. Does not feel that a virtual is necessary at this time. Advised that if she does a home test right now it could too early to give accurate result.

## 2020-10-14 ENCOUNTER — Encounter: Payer: Self-pay | Admitting: Podiatry

## 2020-10-14 ENCOUNTER — Ambulatory Visit: Payer: Medicare Other | Admitting: Podiatry

## 2020-10-14 NOTE — Telephone Encounter (Signed)
Called and rescheduled appt

## 2020-10-14 NOTE — Telephone Encounter (Signed)
Can you call [name] and reschedule the appt for Perri?

## 2020-10-17 ENCOUNTER — Ambulatory Visit: Payer: Medicare Other | Admitting: Internal Medicine

## 2020-10-19 ENCOUNTER — Other Ambulatory Visit: Payer: Self-pay | Admitting: Internal Medicine

## 2020-10-21 ENCOUNTER — Other Ambulatory Visit: Payer: Self-pay | Admitting: Podiatry

## 2020-10-21 ENCOUNTER — Encounter: Payer: Self-pay | Admitting: Internal Medicine

## 2020-10-21 ENCOUNTER — Other Ambulatory Visit: Payer: Self-pay

## 2020-10-21 ENCOUNTER — Encounter: Payer: Self-pay | Admitting: Podiatry

## 2020-10-21 ENCOUNTER — Ambulatory Visit (INDEPENDENT_AMBULATORY_CARE_PROVIDER_SITE_OTHER): Payer: Medicare Other | Admitting: Podiatry

## 2020-10-21 DIAGNOSIS — L089 Local infection of the skin and subcutaneous tissue, unspecified: Secondary | ICD-10-CM

## 2020-10-21 DIAGNOSIS — L03116 Cellulitis of left lower limb: Secondary | ICD-10-CM | POA: Diagnosis not present

## 2020-10-21 DIAGNOSIS — E11628 Type 2 diabetes mellitus with other skin complications: Secondary | ICD-10-CM

## 2020-10-21 DIAGNOSIS — L97522 Non-pressure chronic ulcer of other part of left foot with fat layer exposed: Secondary | ICD-10-CM

## 2020-10-21 MED ORDER — METFORMIN HCL ER 500 MG PO TB24: 500.0000 mg | ORAL_TABLET | Freq: Two times a day (BID) | ORAL | 1 refills | Status: DC

## 2020-10-21 MED ORDER — DOXYCYCLINE HYCLATE 100 MG PO TABS: 100.0000 mg | ORAL_TABLET | Freq: Two times a day (BID) | ORAL | 0 refills | Status: DC

## 2020-10-21 MED ORDER — MUPIROCIN 2 % EX OINT: 1.0000 "application " | TOPICAL_OINTMENT | Freq: Every day | CUTANEOUS | 1 refills | Status: DC

## 2020-10-21 NOTE — Addendum Note (Signed)
Addended by: Graceann Congress D on: 10/21/2020 05:11 PM   Modules accepted: Orders

## 2020-10-21 NOTE — Progress Notes (Signed)
  Subjective:  Patient ID: Holly Rose, female    DOB: 03-21-1930,  MRN: [id number]  Chief Complaint  Patient presents with  . Callouses    Patient presents today for infected callous/ulcer bottom of left forefoot x 3-4 days.  Daughter says the wound was draining this morning.  Patient states "its painful when I walk and it shoots pain through my foot"    87 y.o. female presents with the above complaint. History confirmed with patient.  She is here with her daughter.  The redness has been streaking up to the top of the foot.  Its been draining a lot.  Objective:  Physical Exam: warm, good capillary refill and normal DP and PT pulses.  There is erythema spreading from the base of the previous second third toe amputation sites up to the distal third of the leg.  Plantar there is a large callus with fluctuance underneath it.  There is a small central area that is draining pus.  Post debridement reveals an ulceration extending to subcutaneous tissue measuring 4 cm x 3 cm x 0.3 cm.  No exposed tendon bone joint or capsule      Assessment:   1. Ulcer of left foot with fat layer exposed ([place])   2. Cellulitis of left foot   3. 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   Ulcer left foot with diabetic foot infection and cellulitis -Debridement as below. -Dressed with Iodosorb, DSD. -Surgical shoe and peg assist device to offload the ulcer was dispensed.  WBAT in this -Wound culture was taken -Rx for mupirocin sent to pharmacy dress daily with this -Doxycycline prescription sent to pharmacy twice daily x10 days  Procedure: Excisional Debridement of Wound Rationale: Removal of non-viable soft tissue from the wound to promote healing.  Anesthesia: none Pre-Debridement Wound Measurements: Unable to measure due to overlying keratosis Post-Debridement Wound Measurements: 4.0 x 3.0 x 0.3 cm Type  of Debridement: Sharp Excisional Tissue Removed: Non-viable soft tissue Depth of Debridement: subcutaneous tissue. Technique: Sharp excisional debridement to bleeding, viable wound base.  Dressing: Dry, sterile, compression dressing. Disposition: Patient tolerated procedure well.     Return in about 1 week (around 10/28/2020).

## 2020-10-21 NOTE — Patient Instructions (Signed)
Change the dressing daily with Mupirocin and gauze dressing or adhesive foam border dressing   Monitor for any signs/symptoms of infection. Signs of an infection could be redness beyond the site of the incision/procedure/wound, foul smelling odor, drainage that is thick and yellow or green, or severe swelling and pain. Call the office immediately if any occur or go directly to the emergency room. Call with any questions/concerns.

## 2020-10-21 NOTE — Progress Notes (Signed)
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  INTERVAL HISTORY: Patient returns to clinic today for routine evaluation.  She currently feels well and is asymptomatic.  She does not complain of any weakness or fatigue today.  She continues to tolerate tamoxifen and Fosamax without significant side effects.  She has no neurologic complaints. She denies any recent fevers or illnesses. She has a good appetite and denies weight loss.  She denies any chest pain, shortness of breath, cough, or hemoptysis.  She denies any nausea, vomiting, constipation, or diarrhea. She has no urinary complaints.  Patient offers no specific complaints today.  REVIEW OF SYSTEMS:   Review of Systems  Constitutional: Negative.  Negative for fever, malaise/fatigue and weight loss.  Respiratory: Negative.  Negative for cough and shortness of breath.   Cardiovascular: Negative.  Negative for chest pain and leg swelling.  Gastrointestinal: Negative.  Negative for abdominal pain, blood in stool and melena.  Genitourinary: Negative.  Negative for dysuria.  Musculoskeletal: Negative.  Negative for back pain.  Skin: Negative.  Negative for rash.  Neurological: Negative.  Negative for sensory change, focal weakness, weakness and headaches.  Psychiatric/Behavioral: Negative.  The patient is not nervous/anxious.     As per HPI. Otherwise, a complete review of systems is negative.  PAST MEDICAL HISTORY: Past Medical History:  Diagnosis Date  . Allergy   . Anemia   .  Arthritis   . Atrial fibrillation ([place]) 2004  . Breast cancer, left ([place]) 10/2017   Lumpectomy and rad tx's.   Marland Kitchen CAD (coronary artery disease)   . Complication of anesthesia    hard to wake up with general anesthesia  . Diabetes ([place])   . Dysrhythmia    A-fib  . Heart attack ([place]) 2008  . Heart murmur   . History of kidney stones   . History of sick sinus syndrome    s/p pacemaker placement  . Hyperlipidemia   . Macular degeneration   . Neuropathy   . Neuropathy   . Pacemaker 06/2010  . Personal history of radiation therapy 11/2017   LEFT lumpectomy   . Squamous cell skin cancer   . TIA (transient ischemic attack)     PAST SURGICAL HISTORY: Past Surgical History:  Procedure Laterality Date  . ABDOMINAL HYSTERECTOMY  1960's  . APPENDECTOMY  1947  . [place]  . BREAST BIOPSY Left 12/17/2016   SINGLE FRAGMENT OF ATYPICAL EPITHELIAL CELLS  . BREAST BIOPSY Left 04/08/2017   [facility]  . BREAST EXCISIONAL BIOPSY Left 01/18/1986   Benign microcalcifications, [employer].  Marland Kitchen BREAST LUMPECTOMY Left 05/26/2017   13 mm,T1c, N0; ER/ PR+, Her 2 neu negative, HIGH RISK by Mammoprint ;  Surgeon: [name], MD;  Location: [facility] ORS;  Service: General;  Laterality: Left;  . BREAST SURGERY  1988  . CARDIAC CATHETERIZATION  1989  . CATARACT EXTRACTION  1997 and 1998  . COLONOSCOPY WITH PROPOFOL N/A 09/12/2015   Procedure: COLONOSCOPY WITH PROPOFOL;  Surgeon: [name], MD;  Location: [facility] ENDOSCOPY;  Service: Endoscopy;  Laterality:  N/A;  . IMPLANTABLE CARDIOVERTER DEFIBRILLATOR (ICD) GENERATOR CHANGE Left 03/29/2019   Procedure: PACEMAKER CHANGE OUT;  Surgeon: [name], MD;  Location: [facility] ORS;  Service: Cardiovascular;  Laterality: Left;  . KIDNEY STONE SURGERY  2018  . MOHS SURGERY  2019   forehead  . pace maker  2008  . [place]  . TONSILLECTOMY AND ADENOIDECTOMY  1950"s    FAMILY HISTORY: Family History  Problem  Relation Age of Onset  . Stroke Mother   . Diabetes Mother   . Cancer Maternal Aunt        Breast Cancer  . Breast cancer Maternal Aunt   . Arthritis Maternal Grandmother   . Cancer Maternal Aunt        Lung Cancer  . Diabetes Son   . Cancer Son   . Kidney disease Neg Hx   . Bladder Cancer Neg Hx     ADVANCED DIRECTIVES (Y/N):  N  HEALTH MAINTENANCE: Social History   Tobacco Use  . Smoking status: Former Smoker    Years: 5.00    Quit date: 09/14/1968    Years since quitting: 52.1  . Smokeless tobacco: Never Used  . Tobacco comment: quit 1970  Vaping Use  . Vaping Use: Never used  Substance Use Topics  . Alcohol use: No    Alcohol/week: 0.0 standard drinks  . Drug use: No     Colonoscopy:  PAP:  Bone density:  Lipid panel:  Allergies  Allergen Reactions  . Lyrica [Pregabalin] Swelling    Sleepy, does not eat  . Neurontin [Gabapentin] Nausea And Vomiting and Other (See Comments)    disoriented  . Bactrim [Sulfamethoxazole-Trimethoprim]     Made her dizziness    Current Outpatient Medications  Medication Sig Dispense Refill  . Aflibercept 2 MG/0.05ML SOLN 1 each by Intravitreal route as directed.     Marland Kitchen alendronate (FOSAMAX) 70 MG tablet Take 1 tablet (70 mg total) by mouth once a week. Take with a full glass of water on an empty stomach. 12 tablet 3  . apixaban (ELIQUIS) 2.5 MG TABS tablet Take 1 tablet (2.5 mg total) by mouth 2 (two) times daily. 60 tablet 1  . atorvastatin (LIPITOR) 40 MG tablet Take 1 tablet (40 mg total) by mouth daily. 30 tablet 0  . Biotin w/ Vitamins C & E (HAIR/SKIN/NAILS PO) Take 3 tablets by mouth daily. 2 in the am, 1 in the pm    . blood glucose meter kit and supplies Per patient please dispense One touch Ultra2 meter.Use meter and supplies three times a day to check blood sugar. ICD10: E11.40 1 each 0  . Blood Glucose Monitoring Suppl (FREESTYLE LITE) DEVI 2 (two) times daily.    . Cholecalciferol (VITAMIN D) 50 MCG (2000 UT) CAPS  Take 4,000 Units by mouth daily.    . divalproex (DEPAKOTE) 125 MG DR tablet Take one tablet q hs for one week and then one tablet bid (Patient taking differently: Take 125 mg by mouth at bedtime. Takes 1 tablet at bedtime only) 60 tablet 1  . doxycycline (VIBRA-TABS) 100 MG tablet Take 1 tablet (100 mg total) by mouth 2 (two) times daily. 20 tablet 0  . furosemide (LASIX) 20 MG tablet Take 1 tablet (20 mg total) by mouth 2 (two) times daily. 30 tablet   . glucose blood test strip Use as instructed to check sugars twice daily. Dx E11.9 100 each 12  . isosorbide mononitrate (IMDUR) 30 MG 24 hr  tablet Take 1 tablet (30 mg total) by mouth daily. 30 tablet 0  . Lancets (FREESTYLE) lancets 2 (two) times daily.    . Magnesium 200 MG TABS Take 200 mg by mouth 2 (two) times a day.    . meclizine (ANTIVERT) 12.5 MG tablet Take 1 tablet (12.5 mg total) by mouth 2 (two) times daily as needed for dizziness. 14 tablet 0  . metFORMIN (GLUCOPHAGE-XR) 500 MG 24 hr tablet Take 1 tablet (500 mg total) by mouth 2 (two) times daily. 180 tablet 1  . metoprolol tartrate (LOPRESSOR) 25 MG tablet Take 25 mg by mouth 2 (two) times daily.    . mirtazapine (REMERON) 7.5 MG tablet TAKE 1 TABLET BY MOUTH AT BEDTIME 90 tablet 0  . Multiple Vitamins-Minerals (PRESERVISION AREDS 2 PO) Take 1 tablet by mouth 2 (two) times daily.     . mupirocin ointment (BACTROBAN) 2 % Apply 1 application topically daily. 30 g 1  . OVER THE COUNTER MEDICATION Take 2 tablets by mouth in the morning and at bedtime. Energizing iron tablets    . Polyethyl Glycol-Propyl Glycol (SYSTANE OP) Apply 1 drop to eye 2 (two) times daily.    . Probiotic Product (PROBIOTIC DAILY PO) Take by mouth.    . nitroGLYCERIN (NITROSTAT) 0.4 MG SL tablet Place 0.4 mg under the tongue every 5 (five) minutes as needed for chest pain.  (Patient not taking: No sig reported)    . tamoxifen (NOLVADEX) 20 MG tablet Take 1 tablet (20 mg total) by mouth daily. 90 tablet 3  .  vitamin B-12 (CYANOCOBALAMIN) 500 MCG tablet Take 500 mcg by mouth 2 (two) times a day.     No current facility-administered medications for this visit.    OBJECTIVE: Vitals:   10/23/20 1123  BP: 121/90  Pulse: 81  Temp: 98.2 F (36.8 C)  SpO2: 100%     Body mass index is 21.18 kg/m.    ECOG FS:0 - Asymptomatic  General: Thin, no acute distress. Eyes: Pink conjunctiva, anicteric sclera. HEENT: Normocephalic, moist mucous membranes. Lungs: No audible wheezing or coughing. Heart: Regular rate and rhythm. Abdomen: Soft, nontender, no obvious distention. Musculoskeletal: No edema, cyanosis, or clubbing. Neuro: Alert, answering all questions appropriately. Cranial nerves grossly intact. Skin: No rashes or petechiae noted. Psych: Normal affect.   LAB RESULTS:  Lab Results  Component Value Date   NA 137 10/23/2020   K 3.8 10/23/2020   CL 100 10/23/2020   CO2 27 10/23/2020   GLUCOSE 138 (H) 10/23/2020   BUN 19 10/23/2020   CREATININE 0.79 10/23/2020   CALCIUM 9.6 10/23/2020   PROT 7.6 10/23/2020   ALBUMIN 3.1 (L) 10/23/2020   AST 30 10/23/2020   ALT 22 10/23/2020   ALKPHOS 62 10/23/2020   BILITOT 0.8 10/23/2020   GFRNONAA >60 10/23/2020   GFRAA >60 04/17/2020    Lab Results  Component Value Date   WBC 4.6 10/23/2020   NEUTROABS 3.2 10/23/2020   HGB 10.0 (L) 10/23/2020   HCT 30.7 (L) 10/23/2020   MCV 91.1 10/23/2020   PLT 240 10/23/2020   Lab Results  Component Value Date   IRON 48 10/23/2020   TIBC 290 10/23/2020   IRONPCTSAT 17 10/23/2020   Lab Results  Component Value Date   FERRITIN 27 10/23/2020     STUDIES: No results found.  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  PLAN:    1.  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  She completed adjuvant XRT. Given patient's osteoporosis, she was placed on tamoxifen which she will continue for a minimum of 5 years completing in 2024.  Given her high risk MammaPrint, could possibly consider extending treatment for 7 to 10 years.  Patient's most recent mammogram on March 01, 2020 was reported as BI-RADS 2.  Repeat in June 2022.  Return to clinic in 6 months for routine evaluation.  2.  Osteoporosis: Patient's most recent bone mineral density on July 20, 2019 reported T score of -2.3.  This is improved from 1 year prior where the T score was reported -2.5.  Continue Fosamax, calcium, and vitamin D.  Patient will require repeat bone mineral density in the near future. 3.  Anemia: Patient's hemoglobin is decreased, but stable at 10.0.  Iron stores, B12, folate are all within normal limits.  Monitor.  No intervention is needed at this time.   Patient expressed understanding and was in agreement with this plan. She also understands that She can call clinic at any time with any questions, concerns, or complaints.   Cancer Staging Malignant neoplasm of left female breast [facility]) Staging form: Breast, AJCC 8th Edition - Pathologic stage from 06/27/2017: Stage IA (pT1c, pN0, cM0, G1, ER: Positive, PR: Positive, HER2: Negative) - Signed by [name], MD on 06/27/2017 Neoadjuvant therapy: No Multigene prognostic tests performed: MammaPrint Histologic grading system: 3 grade system Laterality: Left   [name], MD   10/24/2020 9:42 AM

## 2020-10-23 ENCOUNTER — Inpatient Hospital Stay: Payer: Medicare Other | Attending: Oncology

## 2020-10-23 ENCOUNTER — Other Ambulatory Visit: Payer: Self-pay | Admitting: Oncology

## 2020-10-23 ENCOUNTER — Other Ambulatory Visit: Payer: Self-pay

## 2020-10-23 ENCOUNTER — Encounter: Payer: Self-pay | Admitting: Oncology

## 2020-10-23 ENCOUNTER — Inpatient Hospital Stay (HOSPITAL_BASED_OUTPATIENT_CLINIC_OR_DEPARTMENT_OTHER): Payer: Medicare Other | Admitting: Oncology

## 2020-10-23 VITALS — BP 121/90 | HR 81 | Temp 98.2°F | Wt 119.6 lb

## 2020-10-23 DIAGNOSIS — Z7901 Long term (current) use of anticoagulants: Secondary | ICD-10-CM | POA: Insufficient documentation

## 2020-10-23 DIAGNOSIS — C50412 Malignant neoplasm of upper-outer quadrant of left female breast: Secondary | ICD-10-CM

## 2020-10-23 DIAGNOSIS — E119 Type 2 diabetes mellitus without complications: Secondary | ICD-10-CM | POA: Diagnosis not present

## 2020-10-23 DIAGNOSIS — I2510 Atherosclerotic heart disease of native coronary artery without angina pectoris: Secondary | ICD-10-CM | POA: Insufficient documentation

## 2020-10-23 DIAGNOSIS — E785 Hyperlipidemia, unspecified: Secondary | ICD-10-CM | POA: Insufficient documentation

## 2020-10-23 DIAGNOSIS — Z7984 Long term (current) use of oral hypoglycemic drugs: Secondary | ICD-10-CM | POA: Diagnosis not present

## 2020-10-23 DIAGNOSIS — Z923 Personal history of irradiation: Secondary | ICD-10-CM | POA: Insufficient documentation

## 2020-10-23 DIAGNOSIS — Z87891 Personal history of nicotine dependence: Secondary | ICD-10-CM | POA: Diagnosis not present

## 2020-10-23 DIAGNOSIS — Z803 Family history of malignant neoplasm of breast: Secondary | ICD-10-CM | POA: Diagnosis not present

## 2020-10-23 DIAGNOSIS — Z79810 Long term (current) use of selective estrogen receptor modulators (SERMs): Secondary | ICD-10-CM | POA: Diagnosis not present

## 2020-10-23 DIAGNOSIS — M810 Age-related osteoporosis without current pathological fracture: Secondary | ICD-10-CM | POA: Diagnosis not present

## 2020-10-23 DIAGNOSIS — Z79899 Other long term (current) drug therapy: Secondary | ICD-10-CM | POA: Diagnosis not present

## 2020-10-23 DIAGNOSIS — Z809 Family history of malignant neoplasm, unspecified: Secondary | ICD-10-CM | POA: Diagnosis not present

## 2020-10-23 DIAGNOSIS — I252 Old myocardial infarction: Secondary | ICD-10-CM | POA: Diagnosis not present

## 2020-10-23 DIAGNOSIS — D649 Anemia, unspecified: Secondary | ICD-10-CM | POA: Diagnosis not present

## 2020-10-23 DIAGNOSIS — Z170 Estrogen receptor positive status [ER+]: Secondary | ICD-10-CM | POA: Insufficient documentation

## 2020-10-23 DIAGNOSIS — Z8673 Personal history of transient ischemic attack (TIA), and cerebral infarction without residual deficits: Secondary | ICD-10-CM | POA: Diagnosis not present

## 2020-10-23 DIAGNOSIS — D509 Iron deficiency anemia, unspecified: Secondary | ICD-10-CM

## 2020-10-23 DIAGNOSIS — Z801 Family history of malignant neoplasm of trachea, bronchus and lung: Secondary | ICD-10-CM | POA: Diagnosis not present

## 2020-10-23 DIAGNOSIS — G629 Polyneuropathy, unspecified: Secondary | ICD-10-CM | POA: Diagnosis not present

## 2020-10-23 DIAGNOSIS — M129 Arthropathy, unspecified: Secondary | ICD-10-CM | POA: Insufficient documentation

## 2020-10-23 DIAGNOSIS — I4891 Unspecified atrial fibrillation: Secondary | ICD-10-CM | POA: Diagnosis not present

## 2020-10-23 LAB — IRON AND TIBC
Iron: 48 ug/dL (ref 28–170)
Saturation Ratios: 17 % (ref 10.4–31.8)
TIBC: 290 ug/dL (ref 250–450)
UIBC: 242 ug/dL

## 2020-10-23 LAB — COMPREHENSIVE METABOLIC PANEL
ALT: 22 U/L (ref 0–44)
AST: 30 U/L (ref 15–41)
Albumin: 3.1 g/dL — ABNORMAL LOW (ref 3.5–5.0)
Alkaline Phosphatase: 62 U/L (ref 38–126)
Anion gap: 10 (ref 5–15)
BUN: 19 mg/dL (ref 8–23)
CO2: 27 mmol/L (ref 22–32)
Calcium: 9.6 mg/dL (ref 8.9–10.3)
Chloride: 100 mmol/L (ref 98–111)
Creatinine, Ser: 0.79 mg/dL (ref 0.44–1.00)
GFR, Estimated: >60 mL/min (ref >60)
Glucose, Bld: 138 mg/dL — ABNORMAL HIGH (ref 70–99)
Potassium: 3.8 mmol/L (ref 3.5–5.1)
Sodium: 137 mmol/L (ref 135–145)
Total Bilirubin: 0.8 mg/dL (ref 0.3–1.2)
Total Protein: 7.6 g/dL (ref 6.5–8.1)

## 2020-10-23 LAB — CBC WITH DIFFERENTIAL/PLATELET
Abs Immature Granulocytes: 0.05 10*3/uL (ref 0.00–0.07)
Basophils Absolute: 0 10*3/uL (ref 0.0–0.1)
Basophils Relative: 0 %
Eosinophils Absolute: 0 10*3/uL (ref 0.0–0.5)
Eosinophils Relative: 0 %
HCT: 30.7 % — ABNORMAL LOW (ref 36.0–46.0)
Hemoglobin: 10 g/dL — ABNORMAL LOW (ref 12.0–15.0)
Immature Granulocytes: 1 %
Lymphocytes Relative: 21 %
Lymphs Abs: 1 10*3/uL (ref 0.7–4.0)
MCH: 29.7 pg (ref 26.0–34.0)
MCHC: 32.6 g/dL (ref 30.0–36.0)
MCV: 91.1 fL (ref 80.0–100.0)
Monocytes Absolute: 0.3 10*3/uL (ref 0.1–1.0)
Monocytes Relative: 7 %
Neutro Abs: 3.2 10*3/uL (ref 1.7–7.7)
Neutrophils Relative %: 71 %
Platelets: 240 10*3/uL (ref 150–400)
RBC: 3.37 MIL/uL — ABNORMAL LOW (ref 3.87–5.11)
RDW: 15.6 % — ABNORMAL HIGH (ref 11.5–15.5)
WBC: 4.6 10*3/uL (ref 4.0–10.5)
nRBC: 0 % (ref 0.0–0.2)

## 2020-10-23 LAB — WOUND CULTURE

## 2020-10-23 LAB — RETICULOCYTES
Immature Retic Fract: 13.7 % (ref 2.3–15.9)
RBC.: 3.36 MIL/uL — ABNORMAL LOW (ref 3.87–5.11)
Retic Count, Absolute: 36.3 10*3/uL (ref 19.0–186.0)
Retic Ct Pct: 1.1 % (ref 0.4–3.1)

## 2020-10-23 LAB — VITAMIN B12: Vitamin B-12: 4631 pg/mL — ABNORMAL HIGH (ref 180–914)

## 2020-10-23 LAB — FERRITIN: Ferritin: 27 ng/mL (ref 11–307)

## 2020-10-23 MED ORDER — TAMOXIFEN CITRATE 20 MG PO TABS: 20.0000 mg | ORAL_TABLET | Freq: Every day | ORAL | 3 refills | Status: DC

## 2020-10-23 NOTE — Progress Notes (Signed)
Patient denies any concerns today.  

## 2020-10-25 ENCOUNTER — Encounter: Payer: Self-pay | Admitting: Internal Medicine

## 2020-10-25 LAB — PROTEIN ELECTROPHORESIS, SERUM
A/G Ratio: 0.8 (ref 0.7–1.7)
Albumin ELP: 3 g/dL (ref 2.9–4.4)
Alpha-1-Globulin: 0.3 g/dL (ref 0.0–0.4)
Alpha-2-Globulin: 0.9 g/dL (ref 0.4–1.0)
Beta Globulin: 0.7 g/dL (ref 0.7–1.3)
Gamma Globulin: 2.1 g/dL — ABNORMAL HIGH (ref 0.4–1.8)
Globulin, Total: 4 g/dL — ABNORMAL HIGH (ref 2.2–3.9)
M-Spike, %: 1.4 g/dL — ABNORMAL HIGH
Total Protein ELP: 7 g/dL (ref 6.0–8.5)

## 2020-10-25 LAB — COPPER, SERUM: Copper: 116 ug/dL (ref 80–158)

## 2020-10-28 ENCOUNTER — Encounter: Payer: Self-pay | Admitting: Podiatry

## 2020-10-28 ENCOUNTER — Other Ambulatory Visit: Payer: Self-pay

## 2020-10-28 ENCOUNTER — Ambulatory Visit (INDEPENDENT_AMBULATORY_CARE_PROVIDER_SITE_OTHER): Payer: Medicare Other | Admitting: Podiatry

## 2020-10-28 DIAGNOSIS — L97522 Non-pressure chronic ulcer of other part of left foot with fat layer exposed: Secondary | ICD-10-CM

## 2020-10-28 DIAGNOSIS — E1142 Type 2 diabetes mellitus with diabetic polyneuropathy: Secondary | ICD-10-CM | POA: Diagnosis not present

## 2020-10-28 DIAGNOSIS — M79609 Pain in unspecified limb: Secondary | ICD-10-CM

## 2020-10-28 DIAGNOSIS — B351 Tinea unguium: Secondary | ICD-10-CM | POA: Diagnosis not present

## 2020-10-28 LAB — METHYLMALONIC ACID, SERUM: Methylmalonic Acid, Quantitative: 132 nmol/L (ref 0–378)

## 2020-10-28 NOTE — Progress Notes (Signed)
She presents today chief complaint of a painful callus beneath the metatarsophalangeal joints of the left foot.  Currently she states that is feeling much better she is also complaining of painful calluses as well as toenails.  Objective: Vitals are stable she is oriented x3 pulses remain palpable.  Reactive hyper keratoma plantar aspect of the bilateral foot debrided today for her.  No open lesions or wounds.  The ulcerative lesion forefoot left is going to heal uneventfully.  Toenails are long thick yellow dystrophic with mycotic.  Assessment: History of amputation peripheral vascular disease and chronic ulceration with neuropathy.  Well-healing ulcerative lesion forefoot left and onychomycosis.  Plan: Debridement of toenails 1 through 5 bilaterally.  Debridement of ulceration.  And I will follow-up with her in the near future for a reevaluation of the ulceration and she will see Liliane Channel for diabetic shoes.

## 2020-10-28 NOTE — Telephone Encounter (Signed)
We can do a valproic acid level, but our test does not distinguish between total and free.  Ok to do at appt if desires.  Confirm passes screening, given recent diagnosis of covid.

## 2020-10-30 ENCOUNTER — Other Ambulatory Visit: Payer: Self-pay

## 2020-10-30 ENCOUNTER — Ambulatory Visit (INDEPENDENT_AMBULATORY_CARE_PROVIDER_SITE_OTHER): Payer: Medicare Other | Admitting: Internal Medicine

## 2020-10-30 ENCOUNTER — Telehealth: Payer: Self-pay | Admitting: Internal Medicine

## 2020-10-30 VITALS — BP 120/64 | HR 83 | Temp 98.1°F | Resp 16 | Ht 63.0 in | Wt 122.0 lb

## 2020-10-30 DIAGNOSIS — D509 Iron deficiency anemia, unspecified: Secondary | ICD-10-CM

## 2020-10-30 DIAGNOSIS — I480 Paroxysmal atrial fibrillation: Secondary | ICD-10-CM

## 2020-10-30 DIAGNOSIS — M810 Age-related osteoporosis without current pathological fracture: Secondary | ICD-10-CM

## 2020-10-30 DIAGNOSIS — E1165 Type 2 diabetes mellitus with hyperglycemia: Secondary | ICD-10-CM | POA: Diagnosis not present

## 2020-10-30 DIAGNOSIS — E1140 Type 2 diabetes mellitus with diabetic neuropathy, unspecified: Secondary | ICD-10-CM | POA: Diagnosis not present

## 2020-10-30 DIAGNOSIS — F439 Reaction to severe stress, unspecified: Secondary | ICD-10-CM | POA: Diagnosis not present

## 2020-10-30 DIAGNOSIS — I25119 Atherosclerotic heart disease of native coronary artery with unspecified angina pectoris: Secondary | ICD-10-CM | POA: Diagnosis not present

## 2020-10-30 DIAGNOSIS — E785 Hyperlipidemia, unspecified: Secondary | ICD-10-CM

## 2020-10-30 DIAGNOSIS — R441 Visual hallucinations: Secondary | ICD-10-CM

## 2020-10-30 DIAGNOSIS — C50912 Malignant neoplasm of unspecified site of left female breast: Secondary | ICD-10-CM

## 2020-10-30 LAB — HEMOGLOBIN A1C: Hgb A1c MFr Bld: 6.6 % — ABNORMAL HIGH (ref 4.6–6.5)

## 2020-10-30 NOTE — Telephone Encounter (Signed)
Will let provider know

## 2020-10-30 NOTE — Telephone Encounter (Signed)
Truman Holly Rose called wanting Dr. [name] to speak to pt today at her appt about counting to she Dr. Gates Rigg

## 2020-10-30 NOTE — Progress Notes (Signed)
Patient ID: Holly Rose, female   DOB: 1929/12/29, 87 y.o.   MRN: [id number]   Subjective:    Patient ID: Holly Rose, female    DOB: 1930-06-14, 87 y.o.   MRN: [id number]  HPI This visit occurred during the SARS-CoV-2 public health emergency.  Safety protocols were in place, including screening questions prior to the visit, additional usage of staff PPE, and extensive cleaning of exam room while observing appropriate contact time as indicated for disinfecting solutions.  Patient here for a scheduled follow up.  She is accompanied by her daughter-n-law [name]). History obtained from both of them.  Overall appears to be doing better.  Here to follow up regarding her blood pressure, blood sugar and increased stress.  Tries to stay active.  No chest pain or sob with increased activity or exertion.  Breathing overall stable.  No increased cough or congestion.  Recent covid infection.  No residual problems.  No abdominal pain or bowel change reported.  Seeing Dr Nicolasa Ducking.  Maintained on remeron and depakote.  Still with visual hallucinations. They do not bother her as much now.  Sees Dr Grayland Ormond.  Last seen 10/23/20 - continues on tamoxifen and fosamax.  Stable.  Seeing podiatry for foot ulcer.  Improved.  Bandage in place.  Wearing a boot.     Past Medical History:  Diagnosis Date  . Allergy   . Anemia   . Arthritis   . Atrial fibrillation ([place]) 2004  . Breast cancer, left ([place]) 10/2017   Lumpectomy and rad tx's.   Marland Kitchen CAD (coronary artery disease)   . Complication of anesthesia    hard to wake up with general anesthesia  . Diabetes ([place])   . Dysrhythmia    A-fib  . Heart attack ([place]) 2008  . Heart murmur   . History of kidney stones   . History of sick sinus syndrome    s/p pacemaker placement  . Hyperlipidemia   . Macular degeneration   . Neuropathy   . Neuropathy   . Pacemaker 06/2010  . Personal history of radiation therapy 11/2017   LEFT lumpectomy   . Squamous cell skin cancer    . TIA (transient ischemic attack)    Past Surgical History:  Procedure Laterality Date  . ABDOMINAL HYSTERECTOMY  1960's  . APPENDECTOMY  1947  . [place]  . BREAST BIOPSY Left 12/17/2016   SINGLE FRAGMENT OF ATYPICAL EPITHELIAL CELLS  . BREAST BIOPSY Left 04/08/2017   [facility]  . BREAST EXCISIONAL BIOPSY Left 01/18/1986   Benign microcalcifications, [employer].  Marland Kitchen BREAST LUMPECTOMY Left 05/26/2017   13 mm,T1c, N0; ER/ PR+, Her 2 neu negative, HIGH RISK by [facility] ;  Surgeon: [name], MD;  Location: [facility] ORS;  Service: General;  Laterality: Left;  . BREAST SURGERY  1988  . CARDIAC CATHETERIZATION  1989  . CATARACT EXTRACTION  1997 and 1998  . COLONOSCOPY WITH PROPOFOL N/A 09/12/2015   Procedure: COLONOSCOPY WITH PROPOFOL;  Surgeon: [name], MD;  Location[facility] ENDOSCOPY;  Service: Endoscopy;  Laterality: N/A;  . IMPLANTABLE CARDIOVERTER DEFIBRILLATOR (ICD) GENERATOR CHANGE Left 03/29/2019   Procedure: PACEMAKER CHANGE OUT;  Surgeon: [name], MD;  Location: [facility] ORS;  Service: Cardiovascular;  Laterality: Left;  . KIDNEY STONE SURGERY  2018  . MOHS SURGERY  2019   forehead  . pace maker  2008  . [place]  . TONSILLECTOMY AND ADENOIDECTOMY  1950"s  Family History  Problem Relation Age of Onset  . Stroke Mother   . Diabetes Mother   . Cancer Maternal Aunt        Breast Cancer  . Breast cancer Maternal Aunt   . Arthritis Maternal Grandmother   . Cancer Maternal Aunt        Lung Cancer  . Diabetes Son   . Cancer Son   . Kidney disease Neg Hx   . Bladder Cancer Neg Hx    Social History   Socioeconomic History  . Marital status: Married    Spouse name: Holly Rose  . Number of children: 2  . Years of education: Not on file  . Highest education level: Not on file  Occupational History  . Not on file  Tobacco Use  . Smoking status: Former Smoker    Years: 5.00    Quit date: 09/14/1968    Years since  quitting: 52.1  . Smokeless tobacco: Never Used  . Tobacco comment: quit 1970  Vaping Use  . Vaping Use: Never used  Substance and Sexual Activity  . Alcohol use: No    Alcohol/week: 0.0 standard drinks  . Drug use: No  . Sexual activity: Not on file  Other Topics Concern  . Not on file  Social History Narrative   Live home with husband in [place]   Social Determinants of Health   Financial Resource Strain: Not on file  Food Insecurity: Not on file  Transportation Needs: Not on file  Physical Activity: Not on file  Stress: Not on file  Social Connections: Not on file    Outpatient Encounter Medications as of 10/30/2020  Medication Sig  . Aflibercept 2 MG/0.05ML SOLN 1 each by Intravitreal route as directed.   Marland Kitchen alendronate (FOSAMAX) 70 MG tablet Take 1 tablet (70 mg total) by mouth once a week. Take with a full glass of water on an empty stomach.  Marland Kitchen apixaban (ELIQUIS) 2.5 MG TABS tablet Take 1 tablet (2.5 mg total) by mouth 2 (two) times daily.  Marland Kitchen atorvastatin (LIPITOR) 40 MG tablet Take 1 tablet (40 mg total) by mouth daily.  . Biotin w/ Vitamins C & E (HAIR/SKIN/NAILS PO) Take 3 tablets by mouth daily. 2 in the am, 1 in the pm  . blood glucose meter kit and supplies Per patient please dispense One touch Ultra2 meter.Use meter and supplies three times a day to check blood sugar. ICD10: E11.40  . Blood Glucose Monitoring Suppl (FREESTYLE LITE) DEVI 2 (two) times daily.  . Cholecalciferol (VITAMIN D) 50 MCG (2000 UT) CAPS Take 4,000 Units by mouth daily.  . divalproex (DEPAKOTE) 125 MG DR tablet Take one tablet q hs for one week and then one tablet bid (Patient taking differently: Take 125 mg by mouth at bedtime. Takes 1 tablet at bedtime only)  . doxycycline (VIBRA-TABS) 100 MG tablet Take 1 tablet (100 mg total) by mouth 2 (two) times daily.  . furosemide (LASIX) 20 MG tablet Take 1 tablet (20 mg total) by mouth 2 (two) times daily.  Marland Kitchen glucose blood test strip Use  as instructed to check sugars twice daily. Dx E11.9  . isosorbide mononitrate (IMDUR) 30 MG 24 hr tablet Take 1 tablet (30 mg total) by mouth daily.  . Lancets (FREESTYLE) lancets 2 (two) times daily.  . Magnesium 200 MG TABS Take 200 mg by mouth 2 (two) times a day.  . meclizine (ANTIVERT) 12.5 MG tablet Take 1 tablet (12.5 mg total) by mouth  2 (two) times daily as needed for dizziness.  . metFORMIN (GLUCOPHAGE-XR) 500 MG 24 hr tablet Take 1 tablet (500 mg total) by mouth 2 (two) times daily.  . metoprolol tartrate (LOPRESSOR) 25 MG tablet Take 25 mg by mouth 2 (two) times daily.  . mirtazapine (REMERON) 7.5 MG tablet TAKE 1 TABLET BY MOUTH AT BEDTIME  . Multiple Vitamins-Minerals (PRESERVISION AREDS 2 PO) Take 1 tablet by mouth 2 (two) times daily.   . mupirocin ointment (BACTROBAN) 2 % Apply 1 application topically daily.  . nitroGLYCERIN (NITROSTAT) 0.4 MG SL tablet Place 0.4 mg under the tongue every 5 (five) minutes as needed for chest pain.  (Patient not taking: No sig reported)  . OVER THE COUNTER MEDICATION Take 2 tablets by mouth in the morning and at bedtime. Energizing iron tablets  . Polyethyl Glycol-Propyl Glycol (SYSTANE OP) Apply 1 drop to eye 2 (two) times daily.  . Probiotic Product (PROBIOTIC DAILY PO) Take by mouth.  . tamoxifen (NOLVADEX) 20 MG tablet Take 1 tablet (20 mg total) by mouth daily.  . vitamin B-12 (CYANOCOBALAMIN) 500 MCG tablet Take 500 mcg by mouth 2 (two) times a day.   No facility-administered encounter medications on file as of 10/30/2020.    Review of Systems  Constitutional: Negative for appetite change and unexpected weight change.  HENT: Negative for congestion and sinus pressure.   Respiratory: Negative for cough and chest tightness.        Breathing stable.   Cardiovascular: Negative for chest pain and palpitations.       No increased swelling.   Gastrointestinal: Negative for abdominal pain, diarrhea, nausea and vomiting.  Genitourinary:  Negative for difficulty urinating and dysuria.  Musculoskeletal: Negative for joint swelling and myalgias.  Skin: Negative for color change and rash.  Neurological: Negative for dizziness, light-headedness and headaches.  Psychiatric/Behavioral: Negative for agitation and dysphoric mood.       Objective:    Physical Exam Vitals reviewed.  Constitutional:      General: She is not in acute distress.    Appearance: Normal appearance.  HENT:     Head: Normocephalic and atraumatic.     Right Ear: External ear normal.     Left Ear: External ear normal.     Mouth/Throat:     Mouth: Oropharynx is clear and moist.  Eyes:     General: No scleral icterus.       Right eye: No discharge.        Left eye: No discharge.     Conjunctiva/sclera: Conjunctivae normal.  Neck:     Thyroid: No thyromegaly.  Cardiovascular:     Rate and Rhythm: Normal rate.     Comments: Rate controlled.  Pulmonary:     Effort: No respiratory distress.     Breath sounds: Normal breath sounds. No wheezing.  Abdominal:     General: Bowel sounds are normal.     Palpations: Abdomen is soft.     Tenderness: There is no abdominal tenderness.  Musculoskeletal:        General: No tenderness or edema.     Cervical back: Neck supple. No tenderness.     Comments: Bandage in place - foot.  Wearing boot.   No increased swelling.   Lymphadenopathy:     Cervical: No cervical adenopathy.  Skin:    Findings: No erythema or rash.  Neurological:     Mental Status: She is alert.  Psychiatric:        Mood and  Affect: Mood normal.        Behavior: Behavior normal.     BP 120/64   Pulse 83   Temp 98.1 F (36.7 C) (Oral)   Resp 16   Ht 5' 3"  (1.6 m)   Wt 122 lb (55.3 kg)   LMP  (LMP Unknown)   SpO2 98%   BMI 21.61 kg/m  Wt Readings from Last 3 Encounters:  10/30/20 122 lb (55.3 kg)  10/23/20 119 lb 9 oz (54.2 kg)  08/23/20 127 lb 9.6 oz (57.9 kg)     Lab Results  Component Value Date   WBC 4.6 10/23/2020    HGB 10.0 (L) 10/23/2020   HCT 30.7 (L) 10/23/2020   PLT 240 10/23/2020   GLUCOSE 138 (H) 10/23/2020   CHOL 99 07/23/2020   TRIG 60 07/23/2020   HDL 41 07/23/2020   LDLCALC 46 07/23/2020   ALT 22 10/23/2020   AST 30 10/23/2020   NA 137 10/23/2020   K 3.8 10/23/2020   CL 100 10/23/2020   CREATININE 0.79 10/23/2020   BUN 19 10/23/2020   CO2 27 10/23/2020   TSH 1.860 04/15/2020   INR 1.2 07/22/2020   HGBA1C 6.6 (H) 10/30/2020   MICROALBUR 65.4 (H) 03/09/2016    CT ANGIO HEAD W OR WO CONTRAST  Result Date: 07/22/2020 CLINICAL DATA:  Initial evaluation for acute stroke. EXAM: CT ANGIOGRAPHY HEAD AND NECK TECHNIQUE: Multidetector CT imaging of the head and neck was performed using the standard protocol during bolus administration of intravenous contrast. Multiplanar CT image reconstructions and MIPs were obtained to evaluate the vascular anatomy. Carotid stenosis measurements (when applicable) are obtained utilizing NASCET criteria, using the distal internal carotid diameter as the denominator. CONTRAST:  44m OMNIPAQUE IOHEXOL 350 MG/ML SOLN COMPARISON:  Prior CT from earlier the same day. FINDINGS: CTA NECK FINDINGS Aortic arch: Visualized aortic arch of normal caliber with normal branch pattern. Mild-to-moderate atheromatous change about the arch and origin of the great vessels without hemodynamically significant stenosis. Right carotid system: Right CCA patent from its origin to the bifurcation without stenosis. Atheromatous change about the right bifurcation without hemodynamically significant stenosis. Right ICA widely patent distally without stenosis, dissection or occlusion. No made of a focal severe stenosis at the origin of the right external carotid artery (series 6, image 155). Left carotid system: Left CCA patent from its origin to the bifurcation without stenosis. Scattered calcified plaque about the left bifurcation/proximal left ICA with associated stenosis of up to 40% by NASCET  criteria. Left ICA patent distally to the skull base without stenosis, dissection or occlusion. Vertebral arteries: Both vertebral arteries arise from the subclavian arteries. No proximal subclavian artery stenosis. Vertebral arteries widely patent without stenosis, dissection or occlusion. Skeleton: No acute osseous abnormality. No discrete or worrisome osseous lesions. Moderate cervical spondylosis of present at C5-6 and C6-7. Other neck: No other acute soft tissue abnormality within the neck. 4 mm right thyroid nodule noted, felt to be of doubtful significance given size and patient age. No follow-up imaging recommended regarding this lesion. No other mass lesion or adenopathy within the neck. Upper chest: Small layering bilateral pleural effusions noted within the visualized lungs. Emphysematous changes noted. Review of the MIP images confirms the above findings CTA HEAD FINDINGS Anterior circulation: Petrous segments patent bilaterally. Mild scattered atheromatous plaque within the carotid siphons without hemodynamically significant stenosis. A1 segments patent bilaterally. Normal anterior communicating artery complex. Anterior cerebral arteries patent to their distal aspects without stenosis. No M1 stenosis  or occlusion. Normal MCA bifurcations. Distal MCA branches well perfused and symmetric. Posterior circulation: Vertebral arteries patent to the vertebrobasilar junction without stenosis. Left vertebral artery dominant. Patent right PICA. Left PICA not seen. Basilar patent to its distal aspect without stenosis. Superior cerebral arteries patent bilaterally. Both PCAs primarily supplied via the basilar. Left PCA well perfused to its distal aspect without significant stenosis. There is a short-segment severe proximal right P2 stenosis (series 7, image 109). The right PCA is attenuated and small but remains patent to its distal aspect. Venous sinuses: Patent. Anatomic variants: None significant.  No aneurysm.  Review of the MIP images confirms the above findings IMPRESSION: 1. Negative CTA for large vessel occlusion. 2. Short-segment severe proximal right P2 stenosis. The right PCA is attenuated distally but remains patent. Finding in keeping with the chronic right PCA territory infarct. 3. Short-segment 40% atheromatous stenosis at the origin of the left ICA. 4. Short-segment severe stenosis at the origin of the right external carotid artery. 5. Small layering bilateral pleural effusions. 6. Emphysema (ICD10-J43.9). Electronically Signed   By: Jeannine Boga M.D.   On: 07/22/2020 23:49   CT HEAD WO CONTRAST  Addendum Date: 07/22/2020   ADDENDUM REPORT: 07/22/2020 08:48 ADDENDUM: These results were called by telephone at the time of interpretation on 07/22/2020 at 8:38 am to provider [facility] , who verbally acknowledged these results. Electronically Signed   By: Primitivo Gauze M.D.   On: 07/22/2020 08:48   Result Date: 07/22/2020 CLINICAL DATA:  Fall, confusion and hallucinations. EXAM: CT HEAD WITHOUT CONTRAST TECHNIQUE: Contiguous axial images were obtained from the base of the skull through the vertex without intravenous contrast. COMPARISON:  04/15/2020 head CT and prior. FINDINGS: Brain: New subacute right MCA territory infarct. No intracranial hemorrhage. Chronic right occipital insult. Mild cerebral atrophy with ex vacuo dilatation. Chronic microvascular ischemic changes. No mass lesion. No midline shift, ventriculomegaly or extra-axial fluid collection. Vascular: No hyperdense vessel or unexpected calcification. Bilateral skull base atherosclerotic calcifications. Skull: Negative for fracture or focal lesion. Sinuses/Orbits: Normal orbits. Clear paranasal sinuses. No mastoid effusion. Other: None. IMPRESSION: New subacute right MCA territory infarct. Mild cerebral atrophy and chronic microvascular ischemic changes. Remote right occipital insult. Electronically Signed: By: Primitivo Gauze  M.D. On: 07/22/2020 08:32   CT ANGIO NECK W OR WO CONTRAST  Result Date: 07/22/2020 CLINICAL DATA:  Initial evaluation for acute stroke. EXAM: CT ANGIOGRAPHY HEAD AND NECK TECHNIQUE: Multidetector CT imaging of the head and neck was performed using the standard protocol during bolus administration of intravenous contrast. Multiplanar CT image reconstructions and MIPs were obtained to evaluate the vascular anatomy. Carotid stenosis measurements (when applicable) are obtained utilizing NASCET criteria, using the distal internal carotid diameter as the denominator. CONTRAST:  56mL OMNIPAQUE IOHEXOL 350 MG/ML SOLN COMPARISON:  Prior CT from earlier the same day. FINDINGS: CTA NECK FINDINGS Aortic arch: Visualized aortic arch of normal caliber with normal branch pattern. Mild-to-moderate atheromatous change about the arch and origin of the great vessels without hemodynamically significant stenosis. Right carotid system: Right CCA patent from its origin to the bifurcation without stenosis. Atheromatous change about the right bifurcation without hemodynamically significant stenosis. Right ICA widely patent distally without stenosis, dissection or occlusion. No made of a focal severe stenosis at the origin of the right external carotid artery (series 6, image 155). Left carotid system: Left CCA patent from its origin to the bifurcation without stenosis. Scattered calcified plaque about the left bifurcation/proximal left ICA with associated stenosis of  up to 40% by NASCET criteria. Left ICA patent distally to the skull base without stenosis, dissection or occlusion. Vertebral arteries: Both vertebral arteries arise from the subclavian arteries. No proximal subclavian artery stenosis. Vertebral arteries widely patent without stenosis, dissection or occlusion. Skeleton: No acute osseous abnormality. No discrete or worrisome osseous lesions. Moderate cervical spondylosis of present at C5-6 and C6-7. Other neck: No other  acute soft tissue abnormality within the neck. 4 mm right thyroid nodule noted, felt to be of doubtful significance given size and patient age. No follow-up imaging recommended regarding this lesion. No other mass lesion or adenopathy within the neck. Upper chest: Small layering bilateral pleural effusions noted within the visualized lungs. Emphysematous changes noted. Review of the MIP images confirms the above findings CTA HEAD FINDINGS Anterior circulation: Petrous segments patent bilaterally. Mild scattered atheromatous plaque within the carotid siphons without hemodynamically significant stenosis. A1 segments patent bilaterally. Normal anterior communicating artery complex. Anterior cerebral arteries patent to their distal aspects without stenosis. No M1 stenosis or occlusion. Normal MCA bifurcations. Distal MCA branches well perfused and symmetric. Posterior circulation: Vertebral arteries patent to the vertebrobasilar junction without stenosis. Left vertebral artery dominant. Patent right PICA. Left PICA not seen. Basilar patent to its distal aspect without stenosis. Superior cerebral arteries patent bilaterally. Both PCAs primarily supplied via the basilar. Left PCA well perfused to its distal aspect without significant stenosis. There is a short-segment severe proximal right P2 stenosis (series 7, image 109). The right PCA is attenuated and small but remains patent to its distal aspect. Venous sinuses: Patent. Anatomic variants: None significant.  No aneurysm. Review of the MIP images confirms the above findings IMPRESSION: 1. Negative CTA for large vessel occlusion. 2. Short-segment severe proximal right P2 stenosis. The right PCA is attenuated distally but remains patent. Finding in keeping with the chronic right PCA territory infarct. 3. Short-segment 40% atheromatous stenosis at the origin of the left ICA. 4. Short-segment severe stenosis at the origin of the right external carotid artery. 5. Small  layering bilateral pleural effusions. 6. Emphysema (ICD10-J43.9). Electronically Signed   By: Jeannine Boga M.D.   On: 07/22/2020 23:49       Assessment & Plan:   Problem List Items Addressed This Visit    Age-related osteoporosis without current pathological fracture    Continue fosamax.       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  Elected Conservation officer, nature.  Continue isosorbide, metoprolol and lipitor.  Stable.        Diabetes mellitus with neuropathy ([place]) - Primary    Follow met b anda 1c.  On metformin.       Relevant Orders   Hemoglobin A1c (Completed)   Hyperlipidemia    Continue lipitor.  Low cholesterol diet and exercise.  Follow lipid panel and liver function tests.        Iron deficiency anemia    Follow hgb and iron studies.        Malignant neoplasm of left female breast ([place])    S/p XRT.  Continue tamoxifen.        Paroxysmal A-fib ([facility])    Continue metoprolol.  Rated controlled.  Continue eliquis.       Stress    Has good support.  Appears to be doing better.  Follow.       Type II diabetes mellitus ([place])    On metformin.  Follow met b and a1c.  Relevant Orders   Hemoglobin A1c (Completed)   Visual hallucinations    On depakote and remeron.  Tolerating better.  Continue f/u with Dr Nicolasa Ducking.  Check depakote level.       Relevant Orders   Other/Misc lab test       [name], MD

## 2020-10-30 NOTE — Telephone Encounter (Signed)
Spoke with front staff, they are going to screen patient this morning prior to appt. She passes the screening to come in based on her test date just need to confirm no symptoms.

## 2020-10-31 ENCOUNTER — Other Ambulatory Visit: Payer: Medicare Other

## 2020-11-03 ENCOUNTER — Encounter: Payer: Self-pay | Admitting: Internal Medicine

## 2020-11-03 NOTE — Assessment & Plan Note (Signed)
On metformin.  Follow met b and a1c.

## 2020-11-03 NOTE — Assessment & Plan Note (Signed)
Follow hgb and iron studies.   

## 2020-11-03 NOTE — Assessment & Plan Note (Signed)
Recently admitted with NSTEMI.  Elected Conservation officer, nature.  Continue isosorbide, metoprolol and lipitor.  Stable.

## 2020-11-03 NOTE — Assessment & Plan Note (Addendum)
On depakote and remeron.  Tolerating better.  Continue f/u with Dr Nicolasa Ducking.  Check depakote level.

## 2020-11-03 NOTE — Assessment & Plan Note (Signed)
Has good support.  Appears to be doing better.  Follow.

## 2020-11-03 NOTE — Assessment & Plan Note (Signed)
Continue lipitor.  Low cholesterol diet and exercise.  Follow lipid panel and liver function tests.   

## 2020-11-03 NOTE — Assessment & Plan Note (Signed)
Continue fosamax

## 2020-11-03 NOTE — Assessment & Plan Note (Signed)
Follow met b anda 1c.  On metformin.

## 2020-11-03 NOTE — Assessment & Plan Note (Signed)
Continue metoprolol.  Rated controlled.  Continue eliquis.

## 2020-11-03 NOTE — Assessment & Plan Note (Signed)
S/p XRT. Continue tamoxifen.  °

## 2020-11-06 ENCOUNTER — Encounter: Payer: Self-pay | Admitting: Internal Medicine

## 2020-11-08 ENCOUNTER — Other Ambulatory Visit: Payer: Self-pay

## 2020-11-08 MED ORDER — FREESTYLE LITE TEST VI STRP: ORAL_STRIP | 12 refills | Status: DC

## 2020-11-11 ENCOUNTER — Other Ambulatory Visit: Payer: Self-pay | Admitting: *Deleted

## 2020-11-11 DIAGNOSIS — H353221 Exudative age-related macular degeneration, left eye, with active choroidal neovascularization: Secondary | ICD-10-CM | POA: Diagnosis not present

## 2020-11-11 MED ORDER — ALENDRONATE SODIUM 70 MG PO TABS: 70.0000 mg | ORAL_TABLET | ORAL | 3 refills | Status: DC

## 2020-11-12 DIAGNOSIS — F5105 Insomnia due to other mental disorder: Secondary | ICD-10-CM | POA: Diagnosis not present

## 2020-11-12 DIAGNOSIS — H353211 Exudative age-related macular degeneration, right eye, with active choroidal neovascularization: Secondary | ICD-10-CM | POA: Diagnosis not present

## 2020-11-12 DIAGNOSIS — F39 Unspecified mood [affective] disorder: Secondary | ICD-10-CM | POA: Diagnosis not present

## 2020-11-12 DIAGNOSIS — F29 Unspecified psychosis not due to a substance or known physiological condition: Secondary | ICD-10-CM | POA: Diagnosis not present

## 2020-11-13 ENCOUNTER — Telehealth: Payer: Self-pay

## 2020-11-13 DIAGNOSIS — E7800 Pure hypercholesterolemia, unspecified: Secondary | ICD-10-CM | POA: Diagnosis not present

## 2020-11-13 DIAGNOSIS — I4811 Longstanding persistent atrial fibrillation: Secondary | ICD-10-CM | POA: Diagnosis not present

## 2020-11-13 DIAGNOSIS — I25110 Atherosclerotic heart disease of native coronary artery with unstable angina pectoris: Secondary | ICD-10-CM | POA: Diagnosis not present

## 2020-11-13 DIAGNOSIS — I495 Sick sinus syndrome: Secondary | ICD-10-CM | POA: Diagnosis not present

## 2020-11-13 DIAGNOSIS — I341 Nonrheumatic mitral (valve) prolapse: Secondary | ICD-10-CM | POA: Diagnosis not present

## 2020-11-13 LAB — VALPROIC ACID (TOTAL+FREE)

## 2020-11-13 LAB — SPECIMEN STATUS REPORT

## 2020-11-13 NOTE — Telephone Encounter (Signed)
Per report (provider made aware last week): Testing for free drug concentration was made  temporarily non-orderable due to an ongoing nation-wide shortage of  a device required for testing.  As the shortage has broad national  impact, a referral test is not available. The test for total drug  concentration remains orderable.  The test for free drug concentration  will be reactivated as supplies allow.

## 2020-11-13 NOTE — Telephone Encounter (Signed)
Can you help me with this?  We had talked about this patient earlier.  They did not have the supplies to run the depakote level.  Is this what this message is referring to or was there another lab sent?  Thanks.

## 2020-11-18 DIAGNOSIS — H02052 Trichiasis without entropian right lower eyelid: Secondary | ICD-10-CM | POA: Diagnosis not present

## 2020-11-18 LAB — HM DIABETES EYE EXAM

## 2020-11-19 ENCOUNTER — Other Ambulatory Visit: Payer: Medicare Other

## 2020-11-20 ENCOUNTER — Ambulatory Visit: Payer: Medicare Other

## 2020-11-20 ENCOUNTER — Other Ambulatory Visit: Payer: Self-pay

## 2020-11-27 ENCOUNTER — Ambulatory Visit: Payer: Medicare Other | Admitting: Podiatry

## 2020-11-27 ENCOUNTER — Ambulatory Visit: Payer: Medicare Other | Admitting: Orthotics

## 2020-12-01 ENCOUNTER — Other Ambulatory Visit: Payer: Self-pay | Admitting: Internal Medicine

## 2020-12-05 ENCOUNTER — Other Ambulatory Visit: Payer: Self-pay

## 2020-12-05 ENCOUNTER — Ambulatory Visit
Admission: RE | Admit: 2020-12-05 | Discharge: 2020-12-05 | Disposition: A | Discharge status: Home / Self Care | Payer: Medicare Other | Source: Ambulatory Visit | Attending: Oncology | Admitting: Oncology

## 2020-12-05 DIAGNOSIS — M85852 Other specified disorders of bone density and structure, left thigh: Secondary | ICD-10-CM | POA: Diagnosis not present

## 2020-12-05 DIAGNOSIS — M8588 Other specified disorders of bone density and structure, other site: Secondary | ICD-10-CM | POA: Diagnosis not present

## 2020-12-05 DIAGNOSIS — C50412 Malignant neoplasm of upper-outer quadrant of left female breast: Secondary | ICD-10-CM | POA: Diagnosis not present

## 2020-12-05 DIAGNOSIS — Z170 Estrogen receptor positive status [ER+]: Secondary | ICD-10-CM | POA: Diagnosis not present

## 2020-12-05 DIAGNOSIS — M810 Age-related osteoporosis without current pathological fracture: Secondary | ICD-10-CM | POA: Diagnosis not present

## 2020-12-05 DIAGNOSIS — M85832 Other specified disorders of bone density and structure, left forearm: Secondary | ICD-10-CM | POA: Diagnosis not present

## 2020-12-11 ENCOUNTER — Encounter: Payer: Self-pay | Admitting: Podiatry

## 2020-12-11 ENCOUNTER — Ambulatory Visit: Payer: Medicare Other | Admitting: Orthotics

## 2020-12-11 ENCOUNTER — Ambulatory Visit (INDEPENDENT_AMBULATORY_CARE_PROVIDER_SITE_OTHER): Payer: Medicare Other | Admitting: Podiatry

## 2020-12-11 ENCOUNTER — Other Ambulatory Visit: Payer: Self-pay

## 2020-12-11 DIAGNOSIS — E1142 Type 2 diabetes mellitus with diabetic polyneuropathy: Secondary | ICD-10-CM | POA: Diagnosis not present

## 2020-12-11 DIAGNOSIS — D2371 Other benign neoplasm of skin of right lower limb, including hip: Secondary | ICD-10-CM | POA: Diagnosis not present

## 2020-12-11 DIAGNOSIS — B351 Tinea unguium: Secondary | ICD-10-CM

## 2020-12-11 DIAGNOSIS — M79609 Pain in unspecified limb: Secondary | ICD-10-CM

## 2020-12-11 DIAGNOSIS — M79676 Pain in unspecified toe(s): Secondary | ICD-10-CM | POA: Diagnosis not present

## 2020-12-11 DIAGNOSIS — D2372 Other benign neoplasm of skin of left lower limb, including hip: Secondary | ICD-10-CM | POA: Diagnosis not present

## 2020-12-11 NOTE — Progress Notes (Signed)
She presents today chief complaint of a painful elongated toenails and calluses to the plantar aspect of the forefoot bilaterally and the lateral foot right.  Objective: Vital signs stable alert oriented x3.  Pulses are palpable bilateral.  Forefoot bilaterally does demonstrate thick reactive hyperkeratosis no open lesions or wounds along the lateral aspect of the fifth metatarsal base of the right foot does demonstrate poor keratomas.  No open lesions or wounds.  Nails are slightly elongated and thickened.  Assessment: Diabetes mellitus diabetic peripheral neuropathy and angiopathy history of amputation.  She also has pain in limb secondary to onychomycosis and hyperkeratotic tissue.  Plan: Debridement of benign lesions today with debridement of previous ulcerations.  Debridement of nails bilateral.  Her diabetic shoes were not signed off followed by her primary care doctor until the 17th of this month so would be at least another 3 to 4 weeks before that is done.

## 2020-12-24 DIAGNOSIS — H353211 Exudative age-related macular degeneration, right eye, with active choroidal neovascularization: Secondary | ICD-10-CM | POA: Diagnosis not present

## 2020-12-25 ENCOUNTER — Encounter: Payer: Self-pay | Admitting: Emergency Medicine

## 2020-12-25 ENCOUNTER — Other Ambulatory Visit: Payer: Self-pay

## 2020-12-25 ENCOUNTER — Inpatient Hospital Stay
Admission: EM | Admit: 2020-12-25 | Discharge: 2020-12-28 | DRG: 439 | Disposition: A | Discharge status: Home / Self Care | Payer: Medicare Other | Attending: Internal Medicine | Admitting: Internal Medicine

## 2020-12-25 ENCOUNTER — Emergency Department: Payer: Medicare Other

## 2020-12-25 ENCOUNTER — Inpatient Hospital Stay: Payer: Medicare Other

## 2020-12-25 DIAGNOSIS — E1140 Type 2 diabetes mellitus with diabetic neuropathy, unspecified: Secondary | ICD-10-CM | POA: Diagnosis present

## 2020-12-25 DIAGNOSIS — Z87891 Personal history of nicotine dependence: Secondary | ICD-10-CM

## 2020-12-25 DIAGNOSIS — R109 Unspecified abdominal pain: Secondary | ICD-10-CM | POA: Diagnosis not present

## 2020-12-25 DIAGNOSIS — Z7984 Long term (current) use of oral hypoglycemic drugs: Secondary | ICD-10-CM

## 2020-12-25 DIAGNOSIS — Z823 Family history of stroke: Secondary | ICD-10-CM

## 2020-12-25 DIAGNOSIS — Z7983 Long term (current) use of bisphosphonates: Secondary | ICD-10-CM

## 2020-12-25 DIAGNOSIS — Z8673 Personal history of transient ischemic attack (TIA), and cerebral infarction without residual deficits: Secondary | ICD-10-CM | POA: Diagnosis not present

## 2020-12-25 DIAGNOSIS — I480 Paroxysmal atrial fibrillation: Secondary | ICD-10-CM | POA: Diagnosis present

## 2020-12-25 DIAGNOSIS — Z833 Family history of diabetes mellitus: Secondary | ICD-10-CM | POA: Diagnosis not present

## 2020-12-25 DIAGNOSIS — Z923 Personal history of irradiation: Secondary | ICD-10-CM

## 2020-12-25 DIAGNOSIS — Z882 Allergy status to sulfonamides status: Secondary | ICD-10-CM

## 2020-12-25 DIAGNOSIS — R1110 Vomiting, unspecified: Secondary | ICD-10-CM | POA: Diagnosis not present

## 2020-12-25 DIAGNOSIS — F0150 Vascular dementia without behavioral disturbance: Secondary | ICD-10-CM

## 2020-12-25 DIAGNOSIS — I252 Old myocardial infarction: Secondary | ICD-10-CM

## 2020-12-25 DIAGNOSIS — I6389 Other cerebral infarction: Secondary | ICD-10-CM | POA: Diagnosis not present

## 2020-12-25 DIAGNOSIS — K8020 Calculus of gallbladder without cholecystitis without obstruction: Secondary | ICD-10-CM | POA: Diagnosis present

## 2020-12-25 DIAGNOSIS — R1084 Generalized abdominal pain: Secondary | ICD-10-CM | POA: Diagnosis not present

## 2020-12-25 DIAGNOSIS — Z803 Family history of malignant neoplasm of breast: Secondary | ICD-10-CM | POA: Diagnosis not present

## 2020-12-25 DIAGNOSIS — Z20822 Contact with and (suspected) exposure to covid-19: Secondary | ICD-10-CM | POA: Diagnosis present

## 2020-12-25 DIAGNOSIS — I639 Cerebral infarction, unspecified: Secondary | ICD-10-CM | POA: Diagnosis present

## 2020-12-25 DIAGNOSIS — H3530 Unspecified macular degeneration: Secondary | ICD-10-CM | POA: Diagnosis present

## 2020-12-25 DIAGNOSIS — Z950 Presence of cardiac pacemaker: Secondary | ICD-10-CM

## 2020-12-25 DIAGNOSIS — Z7901 Long term (current) use of anticoagulants: Secondary | ICD-10-CM

## 2020-12-25 DIAGNOSIS — F0390 Unspecified dementia without behavioral disturbance: Secondary | ICD-10-CM | POA: Diagnosis present

## 2020-12-25 DIAGNOSIS — Z888 Allergy status to other drugs, medicaments and biological substances status: Secondary | ICD-10-CM

## 2020-12-25 DIAGNOSIS — I493 Ventricular premature depolarization: Secondary | ICD-10-CM | POA: Diagnosis present

## 2020-12-25 DIAGNOSIS — I4891 Unspecified atrial fibrillation: Secondary | ICD-10-CM | POA: Diagnosis present

## 2020-12-25 DIAGNOSIS — Z79810 Long term (current) use of selective estrogen receptor modulators (SERMs): Secondary | ICD-10-CM

## 2020-12-25 DIAGNOSIS — Z853 Personal history of malignant neoplasm of breast: Secondary | ICD-10-CM

## 2020-12-25 DIAGNOSIS — K851 Biliary acute pancreatitis without necrosis or infection: Principal | ICD-10-CM | POA: Diagnosis present

## 2020-12-25 DIAGNOSIS — Z85828 Personal history of other malignant neoplasm of skin: Secondary | ICD-10-CM

## 2020-12-25 DIAGNOSIS — I2510 Atherosclerotic heart disease of native coronary artery without angina pectoris: Secondary | ICD-10-CM | POA: Diagnosis present

## 2020-12-25 DIAGNOSIS — I495 Sick sinus syndrome: Secondary | ICD-10-CM | POA: Diagnosis present

## 2020-12-25 DIAGNOSIS — R8290 Unspecified abnormal findings in urine: Secondary | ICD-10-CM | POA: Diagnosis present

## 2020-12-25 DIAGNOSIS — E119 Type 2 diabetes mellitus without complications: Secondary | ICD-10-CM

## 2020-12-25 DIAGNOSIS — R112 Nausea with vomiting, unspecified: Secondary | ICD-10-CM | POA: Diagnosis not present

## 2020-12-25 DIAGNOSIS — K859 Acute pancreatitis without necrosis or infection, unspecified: Secondary | ICD-10-CM | POA: Diagnosis present

## 2020-12-25 DIAGNOSIS — I491 Atrial premature depolarization: Secondary | ICD-10-CM | POA: Diagnosis not present

## 2020-12-25 DIAGNOSIS — E785 Hyperlipidemia, unspecified: Secondary | ICD-10-CM | POA: Diagnosis present

## 2020-12-25 DIAGNOSIS — Z66 Do not resuscitate: Secondary | ICD-10-CM | POA: Diagnosis present

## 2020-12-25 DIAGNOSIS — S0990XA Unspecified injury of head, initial encounter: Secondary | ICD-10-CM | POA: Diagnosis not present

## 2020-12-25 DIAGNOSIS — I4821 Permanent atrial fibrillation: Secondary | ICD-10-CM | POA: Diagnosis present

## 2020-12-25 DIAGNOSIS — R000 Tachycardia, unspecified: Secondary | ICD-10-CM | POA: Diagnosis not present

## 2020-12-25 DIAGNOSIS — Z79899 Other long term (current) drug therapy: Secondary | ICD-10-CM

## 2020-12-25 DIAGNOSIS — I959 Hypotension, unspecified: Secondary | ICD-10-CM | POA: Diagnosis not present

## 2020-12-25 DIAGNOSIS — E1169 Type 2 diabetes mellitus with other specified complication: Secondary | ICD-10-CM

## 2020-12-25 LAB — URINALYSIS, COMPLETE (UACMP) WITH MICROSCOPIC
Bilirubin Urine: NEGATIVE
Glucose, UA: NEGATIVE mg/dL
Hgb urine dipstick: NEGATIVE
Ketones, ur: NEGATIVE mg/dL
Leukocytes,Ua: NEGATIVE
Nitrite: POSITIVE — AB
Protein, ur: NEGATIVE mg/dL
Specific Gravity, Urine: 1.014 (ref 1.005–1.030)
pH: 6 (ref 5.0–8.0)

## 2020-12-25 LAB — I-STAT CREATININE, ED: Creatinine, Ser: 0.8 mg/dL (ref 0.44–1.00)

## 2020-12-25 LAB — MAGNESIUM: Magnesium: 1.6 mg/dL — ABNORMAL LOW (ref 1.7–2.4)

## 2020-12-25 LAB — COMPREHENSIVE METABOLIC PANEL
ALT: 221 U/L — ABNORMAL HIGH (ref 0–44)
AST: 369 U/L — ABNORMAL HIGH (ref 15–41)
Albumin: 3 g/dL — ABNORMAL LOW (ref 3.5–5.0)
Alkaline Phosphatase: 75 U/L (ref 38–126)
Anion gap: 7 (ref 5–15)
BUN: 22 mg/dL (ref 8–23)
CO2: 26 mmol/L (ref 22–32)
Calcium: 8.9 mg/dL (ref 8.9–10.3)
Chloride: 104 mmol/L (ref 98–111)
Creatinine, Ser: 0.81 mg/dL (ref 0.44–1.00)
GFR, Estimated: >60 mL/min (ref >60)
Glucose, Bld: 121 mg/dL — ABNORMAL HIGH (ref 70–99)
Potassium: 3.8 mmol/L (ref 3.5–5.1)
Sodium: 137 mmol/L (ref 135–145)
Total Bilirubin: 1.1 mg/dL (ref 0.3–1.2)
Total Protein: 6.8 g/dL (ref 6.5–8.1)

## 2020-12-25 LAB — CBC
HCT: 31.4 % — ABNORMAL LOW (ref 36.0–46.0)
Hemoglobin: 10.1 g/dL — ABNORMAL LOW (ref 12.0–15.0)
MCH: 29.9 pg (ref 26.0–34.0)
MCHC: 32.2 g/dL (ref 30.0–36.0)
MCV: 92.9 fL (ref 80.0–100.0)
Platelets: 132 10*3/uL — ABNORMAL LOW (ref 150–400)
RBC: 3.38 MIL/uL — ABNORMAL LOW (ref 3.87–5.11)
RDW: 18 % — ABNORMAL HIGH (ref 11.5–15.5)
WBC: 8.1 10*3/uL (ref 4.0–10.5)
nRBC: 0 % (ref 0.0–0.2)

## 2020-12-25 LAB — LIPASE, BLOOD: Lipase: 1926 U/L — ABNORMAL HIGH (ref 11–51)

## 2020-12-25 LAB — GLUCOSE, CAPILLARY: Glucose-Capillary: 90 mg/dL (ref 70–99)

## 2020-12-25 LAB — TROPONIN I (HIGH SENSITIVITY): Troponin I (High Sensitivity): 11 ng/L (ref <18)

## 2020-12-25 MED ORDER — MIRTAZAPINE 15 MG PO TABS
7.5000 mg | ORAL_TABLET | Freq: Every day | ORAL | Status: DC
  Administered 2020-12-25 – 2020-12-26 (×2): 7.5 mg via ORAL
  Filled 2020-12-25 (×3): qty 1

## 2020-12-25 MED ORDER — ACETAMINOPHEN 650 MG RE SUPP: 650.0000 mg | Freq: Four times a day (QID) | RECTAL | Status: DC | PRN

## 2020-12-25 MED ORDER — ONDANSETRON HCL 4 MG/2ML IJ SOLN: 4.0000 mg | Freq: Four times a day (QID) | INTRAMUSCULAR | Status: DC | PRN

## 2020-12-25 MED ORDER — ENOXAPARIN SODIUM 60 MG/0.6ML ~~LOC~~ SOLN
1.0000 mg/kg | Freq: Two times a day (BID) | SUBCUTANEOUS | Status: DC
  Administered 2020-12-26 (×3): 55 mg via SUBCUTANEOUS
  Filled 2020-12-25 (×4): qty 0.6

## 2020-12-25 MED ORDER — MAGNESIUM SULFATE 2 GM/50ML IV SOLN
2.0000 g | Freq: Once | INTRAVENOUS | Status: AC
  Administered 2020-12-25: 2 g via INTRAVENOUS
  Filled 2020-12-25: qty 50

## 2020-12-25 MED ORDER — METOPROLOL TARTRATE 5 MG/5ML IV SOLN
5.0000 mg | Freq: Four times a day (QID) | INTRAVENOUS | Status: DC
  Administered 2020-12-25 – 2020-12-26 (×4): 5 mg via INTRAVENOUS
  Filled 2020-12-25 (×4): qty 5

## 2020-12-25 MED ORDER — DIVALPROEX SODIUM 125 MG PO DR TAB
125.0000 mg | DELAYED_RELEASE_TABLET | Freq: Every day | ORAL | Status: DC
  Filled 2020-12-25: qty 1

## 2020-12-25 MED ORDER — SODIUM CHLORIDE 0.9 % IV SOLN: INTRAVENOUS | Status: DC

## 2020-12-25 MED ORDER — INSULIN ASPART 100 UNIT/ML ~~LOC~~ SOLN: 0.0000 [IU] | Freq: Three times a day (TID) | SUBCUTANEOUS | Status: DC

## 2020-12-25 MED ORDER — ONDANSETRON HCL 4 MG PO TABS: 4.0000 mg | ORAL_TABLET | Freq: Four times a day (QID) | ORAL | Status: DC | PRN

## 2020-12-25 MED ORDER — FENTANYL CITRATE (PF) 100 MCG/2ML IJ SOLN
25.0000 ug | Freq: Once | INTRAMUSCULAR | Status: AC
  Administered 2020-12-25: 25 ug via INTRAVENOUS
  Filled 2020-12-25: qty 2

## 2020-12-25 MED ORDER — SODIUM CHLORIDE 0.9 % IV BOLUS
500.0000 mL | Freq: Once | INTRAVENOUS | Status: AC
  Administered 2020-12-25: 500 mL via INTRAVENOUS

## 2020-12-25 MED ORDER — IOHEXOL 300 MG/ML  SOLN
75.0000 mL | Freq: Once | INTRAMUSCULAR | Status: AC | PRN
  Administered 2020-12-25: 75 mL via INTRAVENOUS

## 2020-12-25 MED ORDER — TAMOXIFEN CITRATE 10 MG PO TABS
20.0000 mg | ORAL_TABLET | Freq: Every day | ORAL | Status: DC
  Administered 2020-12-26 – 2020-12-28 (×3): 20 mg via ORAL
  Filled 2020-12-25 (×3): qty 2

## 2020-12-25 MED ORDER — ONDANSETRON HCL 4 MG/2ML IJ SOLN
4.0000 mg | Freq: Once | INTRAMUSCULAR | Status: AC
  Administered 2020-12-25: 4 mg via INTRAVENOUS
  Filled 2020-12-25: qty 2

## 2020-12-25 MED ORDER — ACETAMINOPHEN 325 MG PO TABS: 650.0000 mg | ORAL_TABLET | Freq: Four times a day (QID) | ORAL | Status: DC | PRN

## 2020-12-25 NOTE — ED Notes (Signed)
Pt having questionable sustained vtach runs. Per Dr. Charna Archer, placed on defib pads. EKG performed. Appears to be LBBB with afib. Elie Goody, RN aware.

## 2020-12-25 NOTE — ED Notes (Signed)
See triage note. Pt on cardiac monitor. Pt in NAD, states had intractable vomiting last night. Alert and oriented. Denies pain.

## 2020-12-25 NOTE — ED Notes (Signed)
Sent message to Dr Broadus John re: pt is DNR, is on Zoll pads, placed DNR bracelet on patient, also to clarify whether cardioversion counts as "being shocked", which pt stated she doesn't want, and also to make sure code status gets changed in chart from "prior code" to DNR.

## 2020-12-25 NOTE — ED Provider Notes (Signed)
-----------------------------------------   3:28 PM on 12/25/2020 -----------------------------------------  Blood pressure 118/60, pulse 76, temperature 97.6 F (36.4 C), temperature source Oral, resp. rate 18, height 5\' 3"  (1.6 m), weight 55.3 kg, SpO2 100 %.  Assuming care from Dr. Jari Pigg.  In short, Holly Rose is a 87 y.o. female with a chief complaint of No chief complaint on file. Marland Kitchen  Refer to the original H&P for additional details.  The current plan of care is to follow-up CT results and lab results for abdominal pain along with recent head trauma.  ----------------------------------------- 4:42 PM on 12/25/2020 -----------------------------------------  CT head is negative for acute process, CT abdomen/pelvis shows significant peripancreatic inflammation which correlates with her lipase of almost 2000.  She has elevated AST and ALT, but denies alcohol abuse.  Bilirubin as well as alkaline phosphatase are within normal limits and there are no signs of biliary obstruction on CT scan.  Patient does have frequent PVCs along with low magnesium, we will replete with IV magnesium but potassium is within normal limits.  Case discussed with hospitalist for admission.    Blake Divine, MD 12/25/20 [phone]

## 2020-12-25 NOTE — ED Notes (Signed)
Placed purewick on patient  

## 2020-12-25 NOTE — ED Triage Notes (Signed)
Pt comes into the ED via EMS from [place] with abd with vomiting since last night, EMS reports 28 PVC within 1 min. Hx of dementia  114/58 HR68 [id number] 97%RA

## 2020-12-25 NOTE — ED Notes (Signed)
Dr Broadus John at bedside. Pt upset because does not want to be admitted.

## 2020-12-25 NOTE — ED Notes (Signed)
EDP informed of cardiac rhythm. EDP has looked at cardiac strip. Magnesium ordered.

## 2020-12-25 NOTE — H&P (Addendum)
History and Physical    Holly Rose YOV:[id number] DOB: Sep 20, 1929 DOA: 12/25/2020  Referring MD/NP/PA: EDP PCP:  Patient coming from: [place]  Chief Complaint: Abdominal pain, nausea and vomiting  HPI: Holly Rose is a 87 y.o. female with medical history significant of memory loss, early dementia, type 2 diabetes mellitus, breast cancer, CVA presented to the ED today with abdominal pain nausea and vomiting which started last night. -She had numerous episodes of vomiting yesterday, including this morning subsequently EMS was called, in the ED she was noted to have significantly elevated lipase of 1926, abnormal AST ALT, normal bilirubin and alkaline phosphatase, white count was normal, urinalysis noted positive nitrite, otherwise unremarkable -CT abdomen pelvis was concerning for acute pancreatitis, also had evidence of intra and extrahepatic biliary ductal dilation without obstructing calculus.  Review of Systems: As per HPI otherwise 14 point review of systems negative.   Past Medical History:  Diagnosis Date  . Allergy   . Anemia   . Arthritis   . Atrial fibrillation ([place]) 2004  . Breast cancer, left ([place]) 10/2017   Lumpectomy and rad tx's.   Marland Kitchen CAD (coronary artery disease)   . Complication of anesthesia    hard to wake up with general anesthesia  . Diabetes ([place])   . Dysrhythmia    A-fib  . Heart attack ([place]) 2008  . Heart murmur   . History of kidney stones   . History of sick sinus syndrome    s/p pacemaker placement  . Hyperlipidemia   . Macular degeneration   . Neuropathy   . Neuropathy   . Pacemaker 06/2010  . Personal history of radiation therapy 11/2017   LEFT lumpectomy   . Squamous cell skin cancer   . TIA (transient ischemic attack)     Past Surgical History:  Procedure Laterality Date  . ABDOMINAL HYSTERECTOMY  1960's  . APPENDECTOMY  1947  . [place]  . BREAST BIOPSY Left 12/17/2016   SINGLE FRAGMENT  OF ATYPICAL EPITHELIAL CELLS  . BREAST BIOPSY Left 04/08/2017   [facility]  . BREAST EXCISIONAL BIOPSY Left 01/18/1986   Benign microcalcifications, [employer].  Marland Kitchen BREAST LUMPECTOMY Left 05/26/2017   13 mm,T1c, N0; ER/ PR+, Her 2 neu negative, HIGH RISK by [facility] ;  Surgeon: [name], MD;  Location: [facility] ORS;  Service: General;  Laterality: Left;  . BREAST SURGERY  1988  . CARDIAC CATHETERIZATION  1989  . CATARACT EXTRACTION  1997 and 1998  . COLONOSCOPY WITH PROPOFOL N/A 09/12/2015   Procedure: COLONOSCOPY WITH PROPOFOL;  Surgeon: [name], MD;  Location: [facility] ENDOSCOPY;  Service: Endoscopy;  Laterality: N/A;  . IMPLANTABLE CARDIOVERTER DEFIBRILLATOR (ICD) GENERATOR CHANGE Left 03/29/2019   Procedure: PACEMAKER CHANGE OUT;  Surgeon: [name], MD;  Location: [facility] ORS;  Service: Cardiovascular;  Laterality: Left;  . KIDNEY STONE SURGERY  2018  . MOHS SURGERY  2019   forehead  . pace maker  2008  . [place]  . TONSILLECTOMY AND ADENOIDECTOMY  1950"s     reports that she quit smoking about 52 years ago. She quit after 5.00 years of use. She has never used smokeless tobacco. She reports that she does not drink alcohol and does not use drugs.  Allergies  Allergen Reactions  . Lyrica [Pregabalin] Swelling    Sleepy, does not eat  . Neurontin [Gabapentin] Nausea And Vomiting and Other (See Comments)    disoriented  .  Bactrim [Sulfamethoxazole-Trimethoprim]     Made her dizziness    Family History  Problem Relation Age of Onset  . Stroke Mother   . Diabetes Mother   . Cancer Maternal Aunt        Breast Cancer  . Breast cancer Maternal Aunt   . Arthritis Maternal Grandmother   . Cancer Maternal Aunt        Lung Cancer  . Diabetes Son   . Cancer Son   . Kidney disease Neg Hx   . Bladder Cancer Neg Hx      Prior to Admission medications   Medication Sig Start Date End Date Taking? Authorizing Provider  Aflibercept 2 MG/0.05ML SOLN  1 each by Intravitreal route as directed.     [provider]  alendronate (FOSAMAX) 70 MG tablet Take 1 tablet (70 mg total) by mouth once a week. Take with a full glass of water on an empty stomach. 11/11/20   [name], MD  apixaban (ELIQUIS) 2.5 MG TABS tablet Take 1 tablet (2.5 mg total) by mouth 2 (two) times daily. 07/23/20   Lorella Nimrod, MD  atorvastatin (LIPITOR) 40 MG tablet Take 1 tablet (40 mg total) by mouth daily. 04/18/20   Loletha Grayer, MD  Biotin w/ Vitamins C & E (HAIR/SKIN/NAILS PO) Take 3 tablets by mouth daily. 2 in the am, 1 in the pm    [provider]  blood glucose meter kit and supplies Per patient please dispense One touch Ultra2 meter.Use meter and supplies three times a day to check blood sugar. ICD10: E11.40 07/25/15   [name], MD  Blood Glucose Monitoring Suppl (FREESTYLE LITE) DEVI 2 (two) times daily. 03/04/20   [provider]  Cholecalciferol (VITAMIN D) 50 MCG (2000 UT) CAPS Take 4,000 Units by mouth daily.    [provider]  divalproex (DEPAKOTE) 125 MG DR tablet Take one tablet q hs for one week and then one tablet bid Patient taking differently: Take 125 mg by mouth at bedtime. Takes 1 tablet at bedtime only 07/05/20   [name], MD  doxycycline (VIBRA-TABS) 100 MG tablet Take 1 tablet (100 mg total) by mouth 2 (two) times daily. 10/21/20   McDonald, Stephan Minister, DPM  furosemide (LASIX) 20 MG tablet Take 1 tablet (20 mg total) by mouth 2 (two) times daily. 04/18/20   Loletha Grayer, MD  glucose blood (FREESTYLE LITE) test strip Used to check blood sugars twice a day. DX E11.9 11/08/20   [name], MD  isosorbide mononitrate (IMDUR) 30 MG 24 hr tablet Take 1 tablet (30 mg total) by mouth daily. 04/18/20   Loletha Grayer, MD  Lancets (FREESTYLE) lancets 2 (two) times daily. 03/02/20   [provider]  Magnesium 200 MG TABS Take 200 mg by mouth 2 (two) times a day.    [provider]   meclizine (ANTIVERT) 12.5 MG tablet Take 1 tablet (12.5 mg total) by mouth 2 (two) times daily as needed for dizziness. 02/07/20   [name], MD  metFORMIN (GLUCOPHAGE-XR) 500 MG 24 hr tablet Take 1 tablet by mouth twice daily 12/02/20   [name], MD  metoprolol tartrate (LOPRESSOR) 25 MG tablet Take 25 mg by mouth 2 (two) times daily. 06/21/20   [provider]  mirtazapine (REMERON) 7.5 MG tablet TAKE 1 TABLET BY MOUTH AT BEDTIME 07/30/20   [name], MD  Multiple Vitamins-Minerals (PRESERVISION AREDS 2 PO) Take 1 tablet by mouth 2 (two) times daily.  [provider]  mupirocin ointment (BACTROBAN) 2 % Apply 1 application topically daily. 10/21/20   McDonald, Adam R, DPM  nitroGLYCERIN (NITROSTAT) 0.4 MG SL tablet Place 0.4 mg under the tongue every 5 (five) minutes as needed for chest pain.  Patient not taking: No sig reported 04/26/20   [provider]  OVER THE COUNTER MEDICATION Take 2 tablets by mouth in the morning and at bedtime. Energizing iron tablets    [provider]  Polyethyl Glycol-Propyl Glycol (SYSTANE OP) Apply 1 drop to eye 2 (two) times daily.    [provider]  Probiotic Product (PROBIOTIC DAILY PO) Take by mouth.    [provider]  tamoxifen (NOLVADEX) 20 MG tablet Take 1 tablet (20 mg total) by mouth daily. 10/23/20   Finnegan, Timothy J, MD  vitamin B-12 (CYANOCOBALAMIN) 500 MCG tablet Take 500 mcg by mouth 2 (two) times a day.    [provider]    Physical Exam: Vitals:   12/25/20 1419 12/25/20 1423 12/25/20 1430 12/25/20 1600  BP:  118/60 (!) 105/54 110/71  Pulse:  76 82 92  Resp:  18 17 18  Temp:  97.6 F (36.4 C)    TempSrc:  Oral    SpO2:  100% 97% 99%  Weight: 55.3 kg     Height: 5' 3" (1.6 m)         Constitutional: Elderly female laying in bed, appears comfortable, awake alert oriented to self and partly to place only, cognitive deficits noted Vitals:   12/25/20 1419  12/25/20 1423 12/25/20 1430 12/25/20 1600  BP:  118/60 (!) 105/54 110/71  Pulse:  76 82 92  Resp:  18 17 18  Temp:  97.6 F (36.4 C)    TempSrc:  Oral    SpO2:  100% 97% 99%  Weight: 55.3 kg     Height: 5' 3" (1.6 m)      HEENT: No icterus, no pallor CVS: S1-S2, regular rate rhythm Lungs: Clear anteriorly, decreased at the bases Abdomen: Soft, nontender, nondistended, bowel sounds present Extremities: No edema Skin: No rashes on exposed skin Psych: Appears anxious, irritable  Labs on Admission: I have personally reviewed following labs and imaging studies  CBC: Recent Labs  Lab 12/25/20 1429  WBC 8.1  HGB 10.1*  HCT 31.4*  MCV 92.9  PLT 132*   Basic Metabolic Panel: Recent Labs  Lab 12/25/20 1429 12/25/20 1535  NA 137  --   K 3.8  --   CL 104  --   CO2 26  --   GLUCOSE 121*  --   BUN 22  --   CREATININE 0.81 0.80  CALCIUM 8.9  --   MG 1.6*  --    GFR: Estimated Creatinine Clearance: 39.4 mL/min (by C-G formula based on SCr of 0.8 mg/dL). Liver Function Tests: Recent Labs  Lab 12/25/20 1429  AST 369*  ALT 221*  ALKPHOS 75  BILITOT 1.1  PROT 6.8  ALBUMIN 3.0*   Recent Labs  Lab 12/25/20 1429  LIPASE 1,926*   No results for input(s): AMMONIA in the last 168 hours. Coagulation Profile: No results for input(s): INR, PROTIME in the last 168 hours. Cardiac Enzymes: No results for input(s): CKTOTAL, CKMB, CKMBINDEX, TROPONINI in the last 168 hours. BNP (last 3 results) No results for input(s): PROBNP in the last 8760 hours. HbA1C: No results for input(s): HGBA1C in the last 72 hours. CBG: No results for input(s): GLUCAP in the last 168 hours. Lipid   Profile: No results for input(s): CHOL, HDL, LDLCALC, TRIG, CHOLHDL, LDLDIRECT in the last 72 hours. Thyroid Function Tests: No results for input(s): TSH, T4TOTAL, FREET4, T3FREE, THYROIDAB in the last 72 hours. Anemia Panel: No results for input(s): VITAMINB12, FOLATE, FERRITIN, TIBC, IRON,  RETICCTPCT in the last 72 hours. Urine analysis:    Component Value Date/Time   COLORURINE YELLOW (A) 12/25/2020 1605   APPEARANCEUR HAZY (A) 12/25/2020 1605   APPEARANCEUR Clear 06/07/2019 0940   LABSPEC 1.014 12/25/2020 1605   PHURINE 6.0 12/25/2020 1605   GLUCOSEU NEGATIVE 12/25/2020 1605   GLUCOSEU NEGATIVE 05/23/2020 1333   HGBUR NEGATIVE 12/25/2020 1605   BILIRUBINUR NEGATIVE 12/25/2020 1605   BILIRUBINUR Negative 06/07/2019 0940   KETONESUR NEGATIVE 12/25/2020 1605   PROTEINUR NEGATIVE 12/25/2020 1605   UROBILINOGEN 0.2 05/23/2020 1333   NITRITE POSITIVE (A) 12/25/2020 1605   LEUKOCYTESUR NEGATIVE 12/25/2020 1605   Sepsis Labs: _0 (procalcitonin:4,lacticidven:4) )No results found for this or any previous visit (from the past 240 hour(s)).   Radiological Exams on Admission: CT Head Wo Contrast  Result Date: 12/25/2020 CLINICAL DATA:  Nausea and vomiting with recent minor head trauma EXAM: CT HEAD WITHOUT CONTRAST TECHNIQUE: Contiguous axial images were obtained from the Rose of the skull through the vertex without intravenous contrast. COMPARISON:  07/22/2020 FINDINGS: Brain: Changes consistent with prior right MCA infarct are again noted and stable. Infarct in the right occipital lobe is seen as well. No acute hemorrhage, acute infarction or space-occupying mass lesion is noted. Vascular: No hyperdense vessel or unexpected calcification. Skull: Normal. Negative for fracture or focal lesion. Sinuses/Orbits: No acute finding. Other: None. 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 No acute abnormality noted. Electronically Signed   By: Inez Catalina M.D.   On: 12/25/2020 16:08   CT ABDOMEN PELVIS W CONTRAST  Result Date: 12/25/2020 CLINICAL DATA:  Abdominal distension, vomiting. EXAM: CT ABDOMEN AND PELVIS WITH CONTRAST TECHNIQUE: Multidetector CT imaging of the abdomen and pelvis was performed using the standard protocol following bolus  administration of intravenous contrast. CONTRAST:  86m OMNIPAQUE IOHEXOL 300 MG/ML  SOLN COMPARISON:  March 27, 2016. FINDINGS: Lower chest: Small right pleural effusion is noted with adjacent subsegmental atelectasis. Hepatobiliary: Mild gallbladder distention is noted without cholelithiasis. Minimal intrahepatic and extrahepatic biliary dilatation is noted without obstructing calculus. Liver is otherwise unremarkable. Pancreas: Mild amount of fluid is seen surrounding the pancreas concerning for acute pancreatitis. No ductal dilatation or pseudocyst formation is noted. Spleen: Normal in size without focal abnormality. Adrenals/Urinary Tract: Adrenal glands appear normal. Small nonobstructive calculus is noted in lower pole collecting system of left kidney. Small right renal cyst is noted. Mild right-sided hydroureteronephrosis is noted without obstructing calculus. Mild urinary bladder distention is noted. Stomach/Bowel: The stomach appears normal. There appears to be wall and fold thickening involving portions of the duodenum which may represent peptic ulcer disease or inflammation secondary to pancreatitis. Sigmoid diverticulosis is noted without inflammation. No definite evidence of bowel obstruction. Vascular/Lymphatic: Aortic atherosclerosis. No enlarged abdominal or pelvic lymph nodes. Reproductive: Status post hysterectomy. No adnexal masses. Other: No definite hernia is noted. Small amount of free fluid is noted in the pelvis. Musculoskeletal: No acute or significant osseous findings. IMPRESSION: Mild amount of fluid is seen surround the pancreas concerning for acute pancreatitis. No definite pseudocyst formation is noted. There appears to be wall and fold thickening involving portions of the adjacent duodenum which may represent peptic ulcer disease or inflammation secondary to pancreatitis. Small right pleural  effusion is noted with adjacent subsegmental atelectasis. Minimal intrahepatic and extrahepatic  biliary dilatation is noted without obstructing calculus. Mild gallbladder distention is noted without cholelithiasis. Correlation with liver function tests is recommended to rule out obstruction. Mild right-sided hydroureteronephrosis is noted without obstructing calculus. Mild urinary bladder distention is noted. Aortic Atherosclerosis (ICD10-I70.0). Electronically Signed   By: James  Green Jr M.D.   On: 12/25/2020 16:18    EKG: Independently reviewed. Afib, inferior Q waves  Assessment/Plan   Acute pancreatitis -No history of alcoholism, no evidence of pancreatic mass/lesion on CT -Suspect this could be biliary pancreatitis with passed stone -CT without evidence of CBD dilation, normal bilirubin and alkaline phosphatase is reassuring -Admit to MedSurg, bowel rest, n.p.o. with ice chips, sips with meds only for tonight, IV fluids, antiemetics and supportive care -Check right upper quadrant ultrasound -Will request gastroenterology input -Check CMP and CBC in a.m.  Permanent atrial fibrillation -Continue metoprolol, if unable to take meds p.o. will switch to IV -Hold Eliquis, will temporarily use subcu Lovenox  History of CVA -As above  Type 2 diabetes mellitus -Hold Metformin, sliding scale insulin for now  Memory loss, early dementia -Resume Remeron and Depakote -At risk of delirium  Abnormal urinalysis -Positive nitrite without bacteriuria or pyuria -No clinical symptoms of UTI, monitor off antibiotics  PVCs -change metop to IV, replace Mag, monitor on tele  Other -Lasix listed in medrec, patient and spouse do not know why -No echo in our system, holding diuretics at this time   DVT prophylaxis: Lovenox, full dose Code Status: DNR Family Communication: Discussed with patient and spouse at bedside Disposition Plan: Back to independent living facility when improved, hopefully 2 to 3 days Consults called: Gastroenterology Admission status: Inpatient     MD [facility]   12/25/2020, 5:07 PM  

## 2020-12-25 NOTE — ED Notes (Addendum)
12 lead ekg captured.  Ultrasound at bedside.  Sinus rhythm with LBBB. Wide QRSs. Dr Broadus John informed.

## 2020-12-25 NOTE — Consult Note (Signed)
[place] for Lovenox Indication: atrial fibrillation  Allergies  Allergen Reactions  . Lyrica [Pregabalin] Swelling    Sleepy, does not eat  . Neurontin [Gabapentin] Nausea And Vomiting and Other (See Comments)    disoriented  . Bactrim [Sulfamethoxazole-Trimethoprim]     Made her dizziness    Patient Measurements: Height: 5\' 3"  (160 cm) Weight: 55.3 kg (122 lb) IBW/kg (Calculated) : 52.4  Vital Signs: Temp: 97.6 F (36.4 C) (04/13 1423) Temp Source: Oral (04/13 1423) BP: 110/71 (04/13 1600) Pulse Rate: 92 (04/13 1600)  Labs: Recent Labs    12/25/20 1429 12/25/20 1535  HGB 10.1*  --   HCT 31.4*  --   PLT 132*  --   CREATININE 0.81 0.80  TROPONINIHS 11  --     Estimated Creatinine Clearance: 39.4 mL/min (by C-G formula based on SCr of 0.8 mg/dL).   Medical History: Past Medical History:  Diagnosis Date  . Allergy   . Anemia   . Arthritis   . Atrial fibrillation ([place]) 2004  . Breast cancer, left ([place]) 10/2017   Lumpectomy and rad tx's.   Marland Kitchen CAD (coronary artery disease)   . Complication of anesthesia    hard to wake up with general anesthesia  . Diabetes ([place])   . Dysrhythmia    A-fib  . Heart attack ([place]) 2008  . Heart murmur   . History of kidney stones   . History of sick sinus syndrome    s/p pacemaker placement  . Hyperlipidemia   . Macular degeneration   . Neuropathy   . Neuropathy   . Pacemaker 06/2010  . Personal history of radiation therapy 11/2017   LEFT lumpectomy   . Squamous cell skin cancer   . TIA (transient ischemic attack)     Medications:  Eliquis 2.5 mg BID PTA  Assessment: 87 y.o. female with medical history of dementia, T2DM, atrial fibrillation, breast cancer, CVA presented to the ED with abdominal pain nausea and vomiting. CT abdomen pelvis was concerning for acute pancreatitis. Pharmacy has been consulted for Lovenox dosing for atrial fibrillation.  Hgb: 10.1; plts: 132  Goal of  Therapy:  Anti-Xa level 0.6-1 units/ml 4hrs after LMWH dose given Monitor platelets by anticoagulation protocol: Yes   Plan:  Lovenox 55 mg SQ q12h Monitor CBC at least every 72 hours and s/s of bleed   Darnelle Bos 12/25/2020,5:26 PM

## 2020-12-25 NOTE — ED Notes (Signed)
DNR armband on patient.

## 2020-12-25 NOTE — ED Notes (Signed)
Pt repositioned on L side with pillow. Pt complaining of hard hospital stretcher.

## 2020-12-25 NOTE — ED Provider Notes (Signed)
[facility] Emergency Department Provider Note  ____________________________________________   Event Date/Time   First MD Initiated Contact with Patient 12/25/20 1423     (approximate)  I have reviewed the triage vital signs and the nursing notes.   HISTORY  Chief Complaint Abdominal pain.  HPI Holly Rose is a 87 y.o. female with atrial fibrillation, hyperlipidemia who according to med list is on Eliquis but patient herself states that she is not sure she is taking it who comes in with abdominal pain.  Patient reports having entire abdomen pain that started last night.  The pain is constant, severe, nothing makes better, nothing makes it worse.  Denies any chest pain or shortness of breath.  Patient does report hitting her head a few days ago on a cabinet but did not fall to the ground.  She does report some vomiting associated with it.          Past Medical History:  Diagnosis Date  . Allergy   . Anemia   . Arthritis   . Atrial fibrillation ([place]) 2004  . Breast cancer, left ([place]) 10/2017   Lumpectomy and rad tx's.   Marland Kitchen CAD (coronary artery disease)   . Complication of anesthesia    hard to wake up with general anesthesia  . Diabetes ([place])   . Dysrhythmia    A-fib  . Heart attack ([place]) 2008  . Heart murmur   . History of kidney stones   . History of sick sinus syndrome    s/p pacemaker placement  . Hyperlipidemia   . Macular degeneration   . Neuropathy   . Neuropathy   . Pacemaker 06/2010  . Personal history of radiation therapy 11/2017   LEFT lumpectomy   . Squamous cell skin cancer   . TIA (transient ischemic attack)     Patient Active Problem List   Diagnosis Date Noted  . Dementia without behavioral disturbance ([place])   . Stroke ([place]) 07/22/2020  . TIA (transient ischemic attack) 07/22/2020  . Depression 07/22/2020  . Visual hallucinations 07/21/2020  . NSTEMI (non-ST elevated myocardial infarction) ([place]) 04/17/2020  .  Change in mental status 04/15/2020  . Head injury 04/15/2020  . Dysuria 04/30/2019  . Frequent urinary tract infections 04/30/2019  . Age-related osteoporosis without current pathological fracture 03/01/2019  . Dizziness 08/29/2018  . UTI (urinary tract infection) 08/15/2018  . Vitamin D deficiency, unspecified 06/16/2017  . Hypercalcinuria 06/14/2017  . SCC (squamous cell carcinoma), face 03/29/2017  . History of kidney stones 03/12/2017  . Malignant neoplasm of left female breast ([place]) 12/28/2016  . Chronic cystitis 11/05/2016  . Nephrolithiasis 11/05/2016  . Renal colic 19/14/7829  . Urge incontinence 11/05/2016  . Bilateral hand pain 11/03/2016  . Generalized osteoarthritis of hand 11/03/2016  . Numbness and tingling in both hands 11/03/2016  . Bradycardia 04/15/2016  . History of colonic polyps 09/16/2015  . Abdominal pain 08/18/2015  . Rectal bleeding 08/18/2015  . Atherosclerosis of coronary artery with angina pectoris ([place]) 02/17/2015  . Paroxysmal A-fib ([place]) 02/17/2015  . Diabetes mellitus with neuropathy ([place]) 02/17/2015  . Hyperlipidemia 02/17/2015  . Iron deficiency anemia 02/17/2015  . Macular degeneration 02/17/2015  . Stress 02/17/2015  . Health care maintenance 02/17/2015  . Diarrhea 02/17/2015  . Type II diabetes mellitus ([place]) 07/13/2014  . Diverticulosis 03/31/2013  . GI bleeding 03/31/2013  . Mitral valve prolapse 03/31/2013  . Diverticulosis of intestine without perforation or abscess without bleeding 03/31/2013    Past Surgical  History:  Procedure Laterality Date  . ABDOMINAL HYSTERECTOMY  1960's  . APPENDECTOMY  1947  . [place]  . BREAST BIOPSY Left 12/17/2016   SINGLE FRAGMENT OF ATYPICAL EPITHELIAL CELLS  . BREAST BIOPSY Left 04/08/2017   [facility]  . BREAST EXCISIONAL BIOPSY Left 01/18/1986   Benign microcalcifications, [employer].  Marland Kitchen BREAST LUMPECTOMY Left 05/26/2017   13 mm,T1c, N0; ER/ PR+, Her 2 neu negative,  HIGH RISK by [place] ;  Surgeon: [name], MD;  Location: [facility] ORS;  Service: General;  Laterality: Left;  . BREAST SURGERY  1988  . CARDIAC CATHETERIZATION  1989  . CATARACT EXTRACTION  1997 and 1998  . COLONOSCOPY WITH PROPOFOL N/A 09/12/2015   Procedure: COLONOSCOPY WITH PROPOFOL;  Surgeon: [name], MD;  [facility];  Service: Endoscopy;  Laterality: N/A;  . IMPLANTABLE CARDIOVERTER DEFIBRILLATOR (ICD) GENERATOR CHANGE Left 03/29/2019   Procedure: PACEMAKER CHANGE OUT;  Surgeon: [name], MD;  Location: [facility] ORS;  Service: Cardiovascular;  Laterality: Left;  . KIDNEY STONE SURGERY  2018  . MOHS SURGERY  2019   forehead  . pace maker  2008  . [place]  . TONSILLECTOMY AND ADENOIDECTOMY  1950"s    Prior to Admission medications   Medication Sig Start Date End Date Taking? Authorizing Provider  Aflibercept 2 MG/0.05ML SOLN 1 each by Intravitreal route as directed.     [provider]  alendronate (FOSAMAX) 70 MG tablet Take 1 tablet (70 mg total) by mouth once a week. Take with a full glass of water on an empty stomach. 11/11/20   [name], MD  apixaban (ELIQUIS) 2.5 MG TABS tablet Take 1 tablet (2.5 mg total) by mouth 2 (two) times daily. 07/23/20   Lorella Nimrod, MD  atorvastatin (LIPITOR) 40 MG tablet Take 1 tablet (40 mg total) by mouth daily. 04/18/20   Loletha Grayer, MD  Biotin w/ Vitamins C & E (HAIR/SKIN/NAILS PO) Take 3 tablets by mouth daily. 2 in the am, 1 in the pm    [provider]  blood glucose meter kit and supplies Per patient please dispense One touch Ultra2 meter.Use meter and supplies three times a day to check blood sugar. ICD10: E11.40 07/25/15   [name], MD  Blood Glucose Monitoring Suppl (FREESTYLE LITE) DEVI 2 (two) times daily. 03/04/20   [provider]  Cholecalciferol (VITAMIN D) 50 MCG (2000 UT) CAPS Take 4,000 Units by mouth daily.    [provider]  divalproex (DEPAKOTE) 125 MG DR tablet Take one tablet q hs for one week and then one tablet bid Patient taking differently: Take 125 mg by mouth at bedtime. Takes 1 tablet at bedtime only 07/05/20   [name], MD  doxycycline (VIBRA-TABS) 100 MG tablet Take 1 tablet (100 mg total) by mouth 2 (two) times daily. 10/21/20   McDonald, Stephan Minister, DPM  furosemide (LASIX) 20 MG tablet Take 1 tablet (20 mg total) by mouth 2 (two) times daily. 04/18/20   Loletha Grayer, MD  glucose blood (FREESTYLE LITE) test strip Used to check blood sugars twice a day. DX E11.9 11/08/20   [name], MD  isosorbide mononitrate (IMDUR) 30 MG 24 hr tablet Take 1 tablet (30 mg total) by mouth daily. 04/18/20   Loletha Grayer, MD  Lancets (FREESTYLE) lancets 2 (two) times daily. 03/02/20   [provider]  Magnesium 200 MG TABS Take 200 mg by mouth  2 (two) times a day.    [provider]  meclizine (ANTIVERT) 12.5 MG tablet Take 1 tablet (12.5 mg total) by mouth 2 (two) times daily as needed for dizziness. 02/07/20   [name], MD  metFORMIN (GLUCOPHAGE-XR) 500 MG 24 hr tablet Take 1 tablet by mouth twice daily 12/02/20   [name], MD  metoprolol tartrate (LOPRESSOR) 25 MG tablet Take 25 mg by mouth 2 (two) times daily. 06/21/20   [provider]  mirtazapine (REMERON) 7.5 MG tablet TAKE 1 TABLET BY MOUTH AT BEDTIME 07/30/20   [name], MD  Multiple Vitamins-Minerals (PRESERVISION AREDS 2 PO) Take 1 tablet by mouth 2 (two) times daily.     [provider]  mupirocin ointment (BACTROBAN) 2 % Apply 1 application topically daily. 10/21/20   McDonald, Stephan Minister, DPM  nitroGLYCERIN (NITROSTAT) 0.4 MG SL tablet Place 0.4 mg under the tongue every 5 (five) minutes as needed for chest pain.  Patient not taking: No sig reported 04/26/20   [provider]  OVER THE COUNTER MEDICATION Take 2 tablets by mouth in the morning and at bedtime. Energizing iron tablets     [provider]  Polyethyl Glycol-Propyl Glycol (SYSTANE OP) Apply 1 drop to eye 2 (two) times daily.    [provider]  Probiotic Product (PROBIOTIC DAILY PO) Take by mouth.    [provider]  tamoxifen (NOLVADEX) 20 MG tablet Take 1 tablet (20 mg total) by mouth daily. 10/23/20   [name], MD  vitamin B-12 (CYANOCOBALAMIN) 500 MCG tablet Take 500 mcg by mouth 2 (two) times a day.    [provider]    Allergies Lyrica [pregabalin], Neurontin [gabapentin], and Bactrim [sulfamethoxazole-trimethoprim]  Family History  Problem Relation Age of Onset  . Stroke Mother   . Diabetes Mother   . Cancer Maternal Aunt        Breast Cancer  . Breast cancer Maternal Aunt   . Arthritis Maternal Grandmother   . Cancer Maternal Aunt        Lung Cancer  . Diabetes Son   . Cancer Son   . Kidney disease Neg Hx   . Bladder Cancer Neg Hx     Social History Social History   Tobacco Use  . Smoking status: Former Smoker    Years: 5.00    Quit date: 09/14/1968    Years since quitting: 52.3  . Smokeless tobacco: Never Used  . Tobacco comment: quit 1970  Vaping Use  . Vaping Use: Never used  Substance Use Topics  . Alcohol use: No    Alcohol/week: 0.0 standard drinks  . Drug use: No      Review of Systems Constitutional: No fever/chills Eyes: No visual changes. ENT: No sore throat. Cardiovascular: Denies chest pain. Respiratory: Denies shortness of breath. Gastrointestinal: Positive abdominal pain and vomiting Genitourinary: Negative for dysuria. Musculoskeletal: Negative for back pain. Skin: Negative for rash. Neurological: Negative for headaches, focal weakness or numbness. All other ROS negative ____________________________________________   PHYSICAL EXAM:  VITAL SIGNS: ED Triage Vitals  Enc Vitals Group     BP 12/25/20 1423 118/60     Pulse Rate 12/25/20 1423 76     Resp 12/25/20 1423 18     Temp 12/25/20 1423 97.6 F (36.4  C)     Temp Source 12/25/20 1423 Oral     SpO2 12/25/20 1423 100 %     Weight 12/25/20 1419 122 lb (55.3 kg)  Height 12/25/20 1419 $RemoveBefor'5\' 3"'[id number]$  (1.6 m)     Head Circumference --      Peak Flow --      Pain Score 12/25/20 1418 0     Pain Loc --      Pain Edu? --      Excl. in [place]? --     Constitutional: Alert and oriented. Well appearing and in no acute distress. Eyes: Conjunctivae are normal. EOMI. Head: Atraumatic. Nose: No congestion/rhinnorhea. Mouth/Throat: Mucous membranes are moist.   Neck: No stridor. Trachea Midline. FROM Cardiovascular: Normal rate, regular rhythm. Grossly normal heart sounds.  Good peripheral circulation. Respiratory: Normal respiratory effort.  No retractions. Lungs CTAB. Gastrointestinal: Distended with significant tenderness no abdominal bruits.  Musculoskeletal: No lower extremity tenderness nor edema.  No joint effusions. Neurologic:  Normal speech and language. No gross focal neurologic deficits are appreciated.  Skin:  Skin is warm, dry and intact. No rash noted. Psychiatric: Mood and affect are normal. Speech and behavior are normal. GU: Deferred   ____________________________________________   LABS (all labs ordered are listed, but only abnormal results are displayed)  Labs Reviewed  CBC - Abnormal; Notable for the following components:      Result Value   RBC 3.38 (*)    Hemoglobin 10.1 (*)    HCT 31.4 (*)    RDW 18.0 (*)    Platelets 132 (*)    All other components within normal limits  LIPASE, BLOOD  COMPREHENSIVE METABOLIC PANEL  URINALYSIS, COMPLETE (UACMP) WITH MICROSCOPIC  MAGNESIUM   ____________________________________________   ED ECG REPORT I, Vanessa [place], the attending physician, personally viewed and interpreted this ECG.  Atrial fibrillation with frequent PVCs, no ST elevation in her normal leads, no T wave inversions.  Is been read as an acute MI but I disagree with this  reading ____________________________________________  RADIOLOGY   ED MD interpretation:  Pending   Official radiology report(s): No results found.  ____________________________________________   PROCEDURES  Procedure(s) performed (including Critical Care):  .1-3 Lead EKG Interpretation Performed by: Vanessa [place], MD Authorized by: Vanessa , MD     Interpretation: abnormal     ECG rate:  80s    ECG rate assessment: normal     Rhythm: atrial fibrillation     Ectopy: PVCs     Conduction: normal       ____________________________________________   INITIAL IMPRESSION / ASSESSMENT AND PLAN / ED COURSE  Lanijah A Sassi was evaluated in Emergency Department on 12/25/2020 for the symptoms described in the history of present illness. She was evaluated in the context of the global COVID-19 pandemic, which necessitated consideration that the patient might be at risk for infection with the SARS-CoV-2 virus that causes COVID-19. Institutional protocols and algorithms that pertain to the evaluation of patients at risk for COVID-19 are in a state of rapid change based on information released by regulatory bodies including the CDC and federal and state organizations. These policies and algorithms were followed during the patient's care in the ED.    Patient is a 87 year old who comes in with a diffuse abdominal pain.  We will give patient some IV fentanyl, Zofran, fluids.  Will get CT scan to evaluate for appendicitis, diverticulitis, SBO, abscess, perforation.  Patient did have some PVCs with EMS.  We will keep patient on the cardiac monitor and get a magnesium and potassium level and troponin to further evaluate but patient denies any chest pain or shortness of breath at this  time.  Patient handed off to oncoming team pending labs and CT       ____________________________________________   FINAL CLINICAL IMPRESSION(S) / ED DIAGNOSES   Final diagnoses:  None       MEDICATIONS GIVEN DURING THIS VISIT:  Medications - No data to display   ED Discharge Orders    None       Note:  This document was prepared using Dragon voice recognition software and may include unintentional dictation errors.   Vanessa [place], MD 12/25/20 [phone]

## 2020-12-25 NOTE — ED Notes (Addendum)
Pt had run of PVCs. Strip was printed out by other nurse and handed to EDP.  IV magnesium still infusing. EDP has consulted with attending doctor.  Pt is on Zoll pads. Currently in NSR.  This nurse also sent msg to Dr Broadus John.

## 2020-12-25 NOTE — ED Notes (Signed)
Pt in CT right now. Will recheck BP when returns. CT tech states that istat creatinine being processed on pt.

## 2020-12-26 DIAGNOSIS — F0150 Vascular dementia without behavioral disturbance: Secondary | ICD-10-CM | POA: Diagnosis not present

## 2020-12-26 DIAGNOSIS — K859 Acute pancreatitis without necrosis or infection, unspecified: Secondary | ICD-10-CM | POA: Diagnosis not present

## 2020-12-26 LAB — CBC
HCT: 31.2 % — ABNORMAL LOW (ref 36.0–46.0)
Hemoglobin: 10.1 g/dL — ABNORMAL LOW (ref 12.0–15.0)
MCH: 30 pg (ref 26.0–34.0)
MCHC: 32.4 g/dL (ref 30.0–36.0)
MCV: 92.6 fL (ref 80.0–100.0)
Platelets: 127 10*3/uL — ABNORMAL LOW (ref 150–400)
RBC: 3.37 MIL/uL — ABNORMAL LOW (ref 3.87–5.11)
RDW: 18.2 % — ABNORMAL HIGH (ref 11.5–15.5)
WBC: 6.5 10*3/uL (ref 4.0–10.5)
nRBC: 0 % (ref 0.0–0.2)

## 2020-12-26 LAB — LIPASE, BLOOD: Lipase: 544 U/L — ABNORMAL HIGH (ref 11–51)

## 2020-12-26 LAB — COMPREHENSIVE METABOLIC PANEL
ALT: 165 U/L — ABNORMAL HIGH (ref 0–44)
AST: 202 U/L — ABNORMAL HIGH (ref 15–41)
Albumin: 2.9 g/dL — ABNORMAL LOW (ref 3.5–5.0)
Alkaline Phosphatase: 75 U/L (ref 38–126)
Anion gap: 7 (ref 5–15)
BUN: 18 mg/dL (ref 8–23)
CO2: 25 mmol/L (ref 22–32)
Calcium: 8.5 mg/dL — ABNORMAL LOW (ref 8.9–10.3)
Chloride: 107 mmol/L (ref 98–111)
Creatinine, Ser: 0.62 mg/dL (ref 0.44–1.00)
GFR, Estimated: >60 mL/min (ref >60)
Glucose, Bld: 75 mg/dL (ref 70–99)
Potassium: 3.5 mmol/L (ref 3.5–5.1)
Sodium: 139 mmol/L (ref 135–145)
Total Bilirubin: 0.7 mg/dL (ref 0.3–1.2)
Total Protein: 6.6 g/dL (ref 6.5–8.1)

## 2020-12-26 LAB — SARS CORONAVIRUS 2 (TAT 6-24 HRS): SARS Coronavirus 2: NEGATIVE

## 2020-12-26 LAB — GLUCOSE, CAPILLARY
Glucose-Capillary: 70 mg/dL (ref 70–99)
Glucose-Capillary: 109 mg/dL — ABNORMAL HIGH (ref 70–99)
Glucose-Capillary: 112 mg/dL — ABNORMAL HIGH (ref 70–99)

## 2020-12-26 LAB — PROTIME-INR
INR: 1.4 — ABNORMAL HIGH (ref 0.8–1.2)
Prothrombin Time: 17.5 seconds — ABNORMAL HIGH (ref 11.4–15.2)

## 2020-12-26 LAB — MRSA PCR SCREENING: MRSA by PCR: NEGATIVE

## 2020-12-26 MED ORDER — METOPROLOL TARTRATE 25 MG PO TABS
25.0000 mg | ORAL_TABLET | Freq: Two times a day (BID) | ORAL | Status: DC
  Administered 2020-12-26 – 2020-12-28 (×5): 25 mg via ORAL
  Filled 2020-12-26 (×5): qty 1

## 2020-12-26 MED ORDER — LORAZEPAM 2 MG/ML IJ SOLN
0.5000 mg | INTRAMUSCULAR | Status: DC | PRN
  Administered 2020-12-26: 0.5 mg via INTRAVENOUS
  Filled 2020-12-26: qty 1

## 2020-12-26 NOTE — Consult Note (Signed)
GI Inpatient Consult Note  Reason for Consult: Acute pancreatitis    Attending Requesting Consult: Dr. Domenic Polite, MD  History of Present Illness: Holly Rose is a 87 y.o. female seen for evaluation of acute pancreatitis at the request of Dr. Domenic Polite. Pt has a PMH of early dementia, T2DM, Hx of breast cancer, Hx of CVA, permanent atrial fibrillation on Eliquis, s/p pacemaker placement, and CAD. She presented to the ED yesterday from [place] [facility] via EMS for chief complaint of acute abdominal pain, nausea, and vomiting which started the day before. Upon presentation to the ED, patient was found to have significantly elevated lipase 1926 , transaminitis with AST 369, ALT 221 with normal tbili 1.1 and alk phos 75. WBC was within normal limits at 8.1. CT scan showed evidence of acute pancreatitis with no pseudocyst and evidence of minimal intrahepatic and extrahepatic biliary dilatation noted without evidence of obstructing stone. GI was consulted for further evaluation and management of acute pancreatitis.  Patient seen and examined this morning. Patient standing up in room folding shirt and crying this morning. She reports she is waiting on her husband and grandson this morning to take her home. Patient doesn't know where she is and would like to go home. She does report she came to the hospital because of acute onset of epigastric abdominal pain with nausea and vomiting. She reports this was the first time she has experienced symptoms like this. She denies any alcohol use. She reports her abdominal pain has completely resolved. There have been no further episodes of vomiting since hospital admission. She denies nausea. She reports she is very thirsty and would like to drink something. LFTs this morning improving -AST 202, ALT 165, INR 1.4. Lipase down to 544.    Past Medical History:  Past Medical History:  Diagnosis Date  . Allergy   . Anemia   . Arthritis   . Atrial  fibrillation ([place]) 2004  . Breast cancer, left ([place]) 10/2017   Lumpectomy and rad tx's.   Marland Kitchen CAD (coronary artery disease)   . Complication of anesthesia    hard to wake up with general anesthesia  . Diabetes ([place])   . Dysrhythmia    A-fib  . Heart attack ([place]) 2008  . Heart murmur   . History of kidney stones   . History of sick sinus syndrome    s/p pacemaker placement  . Hyperlipidemia   . Macular degeneration   . Neuropathy   . Neuropathy   . Pacemaker 06/2010  . Personal history of radiation therapy 11/2017   LEFT lumpectomy   . Squamous cell skin cancer   . TIA (transient ischemic attack)     Problem List: Patient Active Problem List   Diagnosis Date Noted  . Pancreatitis, acute 12/25/2020  . Pancreatitis 12/25/2020  . Dementia without behavioral disturbance ([place])   . Stroke ([place]) 07/22/2020  . TIA (transient ischemic attack) 07/22/2020  . Depression 07/22/2020  . Visual hallucinations 07/21/2020  . NSTEMI (non-ST elevated myocardial infarction) ([place]) 04/17/2020  . Change in mental status 04/15/2020  . Head injury 04/15/2020  . Dysuria 04/30/2019  . Frequent urinary tract infections 04/30/2019  . Age-related osteoporosis without current pathological fracture 03/01/2019  . Dizziness 08/29/2018  . UTI (urinary tract infection) 08/15/2018  . Vitamin D deficiency, unspecified 06/16/2017  . Hypercalcinuria 06/14/2017  . SCC (squamous cell carcinoma), face 03/29/2017  . History of kidney stones 03/12/2017  . Malignant neoplasm of left female breast (  [place]) 12/28/2016  . Chronic cystitis 11/05/2016  . Nephrolithiasis 11/05/2016  . Renal colic 62/94/7654  . Urge incontinence 11/05/2016  . Bilateral hand pain 11/03/2016  . Generalized osteoarthritis of hand 11/03/2016  . Numbness and tingling in both hands 11/03/2016  . Bradycardia 04/15/2016  . History of colonic polyps 09/16/2015  . Abdominal pain 08/18/2015  . Rectal bleeding 08/18/2015  . Atherosclerosis of  coronary artery with angina pectoris ([place]) 02/17/2015  . Paroxysmal A-fib ([place]) 02/17/2015  . Diabetes mellitus with neuropathy ([place]) 02/17/2015  . Hyperlipidemia 02/17/2015  . Iron deficiency anemia 02/17/2015  . Macular degeneration 02/17/2015  . Stress 02/17/2015  . Health care maintenance 02/17/2015  . Diarrhea 02/17/2015  . Type II diabetes mellitus ([place]) 07/13/2014  . Diverticulosis 03/31/2013  . GI bleeding 03/31/2013  . Mitral valve prolapse 03/31/2013  . Diverticulosis of intestine without perforation or abscess without bleeding 03/31/2013    Past Surgical History: Past Surgical History:  Procedure Laterality Date  . ABDOMINAL HYSTERECTOMY  1960's  . APPENDECTOMY  1947  . [place]  . BREAST BIOPSY Left 12/17/2016   SINGLE FRAGMENT OF ATYPICAL EPITHELIAL CELLS  . BREAST BIOPSY Left 04/08/2017   [facility]  . BREAST EXCISIONAL BIOPSY Left 01/18/1986   Benign microcalcifications, [employer].  Marland Kitchen BREAST LUMPECTOMY Left 05/26/2017   13 mm,T1c, N0; ER/ PR+, Her 2 neu negative, HIGH RISK by [facility] ;  Surgeon: [name], MD;  Location: [facility] ORS;  Service: General;  Laterality: Left;  . BREAST SURGERY  1988  . CARDIAC CATHETERIZATION  1989  . CATARACT EXTRACTION  1997 and 1998  . COLONOSCOPY WITH PROPOFOL N/A 09/12/2015   Procedure: COLONOSCOPY WITH PROPOFOL;  Surgeon: [name], MD;  Location: [facility] ENDOSCOPY;  Service: Endoscopy;  Laterality: N/A;  . IMPLANTABLE CARDIOVERTER DEFIBRILLATOR (ICD) GENERATOR CHANGE Left 03/29/2019   Procedure: PACEMAKER CHANGE OUT;  Surgeon: [name], MD;  Location: [facility] ORS;  Service: Cardiovascular;  Laterality: Left;  . KIDNEY STONE SURGERY  2018  . MOHS SURGERY  2019   forehead  . pace maker  2008  . [place]  . TONSILLECTOMY AND ADENOIDECTOMY  1950"s    Allergies: Allergies  Allergen Reactions  . Lyrica [Pregabalin] Swelling    Sleepy, does not eat  . Neurontin  [Gabapentin] Nausea And Vomiting and Other (See Comments)    disoriented  . Bactrim [Sulfamethoxazole-Trimethoprim]     Made her dizziness    Home Medications: Medications Prior to Admission  Medication Sig Dispense Refill Last Dose  . Aflibercept 2 MG/0.05ML SOLN 1 each by Intravitreal route as directed.      Marland Kitchen alendronate (FOSAMAX) 70 MG tablet Take 1 tablet (70 mg total) by mouth once a week. Take with a full glass of water on an empty stomach. 12 tablet 3   . apixaban (ELIQUIS) 2.5 MG TABS tablet Take 1 tablet (2.5 mg total) by mouth 2 (two) times daily. 60 tablet 1   . atorvastatin (LIPITOR) 40 MG tablet Take 1 tablet (40 mg total) by mouth daily. 30 tablet 0   . Biotin w/ Vitamins C & E (HAIR/SKIN/NAILS PO) Take 3 tablets by mouth daily. 2 in the am, 1 in the pm     . blood glucose meter kit and supplies Per patient please dispense One touch Ultra2 meter.Use meter and supplies three times a day to check blood sugar. ICD10: E11.40 1 each 0   . Blood Glucose Monitoring  Suppl (FREESTYLE LITE) DEVI 2 (two) times daily.     . Cholecalciferol (VITAMIN D) 50 MCG (2000 UT) CAPS Take 4,000 Units by mouth daily.     . divalproex (DEPAKOTE) 125 MG DR tablet Take one tablet q hs for one week and then one tablet bid (Patient taking differently: Take 125 mg by mouth at bedtime. Takes 1 tablet at bedtime only) 60 tablet 1   . doxycycline (VIBRA-TABS) 100 MG tablet Take 1 tablet (100 mg total) by mouth 2 (two) times daily. 20 tablet 0   . furosemide (LASIX) 20 MG tablet Take 1 tablet (20 mg total) by mouth 2 (two) times daily. 30 tablet    . glucose blood (FREESTYLE LITE) test strip Used to check blood sugars twice a day. DX E11.9 100 each 12   . isosorbide mononitrate (IMDUR) 30 MG 24 hr tablet Take 1 tablet (30 mg total) by mouth daily. 30 tablet 0   . Lancets (FREESTYLE) lancets 2 (two) times daily.     . Magnesium 200 MG TABS Take 200 mg by mouth 2 (two) times a day.     . meclizine (ANTIVERT) 12.5  MG tablet Take 1 tablet (12.5 mg total) by mouth 2 (two) times daily as needed for dizziness. 14 tablet 0   . metFORMIN (GLUCOPHAGE-XR) 500 MG 24 hr tablet Take 1 tablet by mouth twice daily 90 tablet 0   . metoprolol tartrate (LOPRESSOR) 25 MG tablet Take 25 mg by mouth 2 (two) times daily.     . mirtazapine (REMERON) 7.5 MG tablet TAKE 1 TABLET BY MOUTH AT BEDTIME 90 tablet 0   . Multiple Vitamins-Minerals (PRESERVISION AREDS 2 PO) Take 1 tablet by mouth 2 (two) times daily.      . mupirocin ointment (BACTROBAN) 2 % Apply 1 application topically daily. 30 g 1   . nitroGLYCERIN (NITROSTAT) 0.4 MG SL tablet Place 0.4 mg under the tongue every 5 (five) minutes as needed for chest pain.  (Patient not taking: No sig reported)     . OVER THE COUNTER MEDICATION Take 2 tablets by mouth in the morning and at bedtime. Energizing iron tablets     . Polyethyl Glycol-Propyl Glycol (SYSTANE OP) Apply 1 drop to eye 2 (two) times daily.     . Probiotic Product (PROBIOTIC DAILY PO) Take by mouth.     . tamoxifen (NOLVADEX) 20 MG tablet Take 1 tablet (20 mg total) by mouth daily. 90 tablet 3   . vitamin B-12 (CYANOCOBALAMIN) 500 MCG tablet Take 500 mcg by mouth 2 (two) times a day.      Home medication reconciliation was completed with the patient.   Scheduled Inpatient Medications:   . divalproex  125 mg Oral QHS  . enoxaparin (LOVENOX) injection  1 mg/kg Subcutaneous Q12H  . insulin aspart  0-9 Units Subcutaneous TID WC  . metoprolol tartrate  5 mg Intravenous Q6H  . mirtazapine  7.5 mg Oral QHS  . tamoxifen  20 mg Oral Daily    Continuous Inpatient Infusions:   . sodium chloride 100 mL/hr at 12/26/20 0525    PRN Inpatient Medications:  acetaminophen **OR** acetaminophen, LORazepam, ondansetron **OR** ondansetron (ZOFRAN) IV  Family History: family history includes Arthritis in her maternal grandmother; Breast cancer in her maternal aunt; Cancer in her maternal aunt, maternal aunt, and son;  Diabetes in her mother and son; Stroke in her mother.  The patient's family history is negative for inflammatory bowel disorders, GI malignancy, or solid organ transplantation.  Social History:   reports that she quit smoking about 52 years ago. She quit after 5.00 years of use. She has never used smokeless tobacco. She reports that she does not drink alcohol and does not use drugs. The patient denies ETOH, tobacco, or drug use.   Review of Systems: Constitutional: Weight is stable.  Eyes: No changes in vision. ENT: No oral lesions, sore throat.  GI: see HPI.  Heme/Lymph: No easy bruising.  CV: No chest pain.  GU: No hematuria.  Integumentary: No rashes.  Neuro: No headaches.  Psych: No depression/anxiety.  Endocrine: No heat/cold intolerance.  Allergic/Immunologic: No urticaria.  Resp: No cough, SOB.  Musculoskeletal: No joint swelling.    Physical Examination: BP 121/69 (BP Location: Left Arm)   Pulse 70   Temp 97.8 F (36.6 C)   Resp 18   Ht $R'5\' 3"'Oj$  (1.6 m)   Wt 55.6 kg   LMP  (LMP Unknown)   SpO2 98%   BMI 21.72 kg/m  Pleasantly demented elderly female standing up in room beside chair folding her shirt. Answers all questions appropriately.  Gen: NAD, alert to self HEENT: PEERLA, EOMI, Neck: supple, no JVD or thyromegaly Chest: CTA bilaterally, no wheezes, crackles, or other adventitious sounds CV: RRR, no m/g/c/r Abd: soft, ND, +BS in all four quadrants; nontender to palpation in all four quadrants, no epigastric TTP, no HSM, guarding, ridigity, or rebound tenderness Ext: no edema, well perfused with 2+ pulses, Skin: no rash or lesions noted Lymph: no LAD  Data: Lab Results  Component Value Date   WBC 6.5 12/26/2020   HGB 10.1 (L) 12/26/2020   HCT 31.2 (L) 12/26/2020   MCV 92.6 12/26/2020   PLT 127 (L) 12/26/2020   Recent Labs  Lab 12/25/20 1429 12/26/20 0534  HGB 10.1* 10.1*   Lab Results  Component Value Date   NA 139 12/26/2020   K 3.5 12/26/2020    CL 107 12/26/2020   CO2 25 12/26/2020   BUN 18 12/26/2020   CREATININE 0.62 12/26/2020   GLU 164 12/01/2014   Lab Results  Component Value Date   ALT 165 (H) 12/26/2020   AST 202 (H) 12/26/2020   ALKPHOS 75 12/26/2020   BILITOT 0.7 12/26/2020   Recent Labs  Lab 12/26/20 0534  INR 1.4*   CT abd/pelvis with contrast 12/25/2020: IMPRESSION: Mild amount of fluid is seen surround the pancreas concerning for acute pancreatitis. No definite pseudocyst formation is noted. There appears to be wall and fold thickening involving portions of the adjacent duodenum which may represent peptic ulcer disease or inflammation secondary to pancreatitis.  Small right pleural effusion is noted with adjacent subsegmental atelectasis.  Minimal intrahepatic and extrahepatic biliary dilatation is noted without obstructing calculus. Mild gallbladder distention is noted without cholelithiasis. Correlation with liver function tests is recommended to rule out obstruction.  Mild right-sided hydroureteronephrosis is noted without obstructing calculus. Mild urinary bladder distention is noted.  Aortic Atherosclerosis (ICD10-I70.0).  RUQ [place] 12/25/2020: IMPRESSION: 1. Gallstones without evidence for acute cholecystitis. 2. Small right pleural effusion 3. Right kidney may be slightly echogenic, correlate with appropriate laboratory values.   Assessment/Plan:  87 y/o Caucasian female with a PMH of early dementia, T2DM, Hx of breast cancer, Hx of CVA, permanent atrial fibrillation on Eliquis, s/p pacemaker placement, and CAD admitted to [facility] hospital yesterday for acute pancreatitis  1. Acute pancreatitis - possibly 2/2 passed gallstone. Currently, no cross-sectional imaging to suggest CBD dilatation or cholestasis to suggest biliary obstruction. No alcohol  use. No evidence of pancreatic mass/lesion on CT scan.   2. Cholelithiasis without evidence of cholecystitis   3. Transaminitis - improving,  likely 2/2 passed stone. No serologic evidence for cholestasis to suggest biliary obstruction  4. Dementia  5. DNR status   Recommendations:  - Start clear liquid diet and advance as tolerated - Continue supportive care - Recommend serial abdominal examinations  - Lipase and LFTs downtrending. Pt likely had passed stone.  - No further GI interventions needed at this time - Following along with you    Thank you for the consult. Please call with questions or concerns.  Reeves Forth [facility] [phone] (Cell)

## 2020-12-26 NOTE — Progress Notes (Addendum)
PROGRESS NOTE    Holly Rose  FBP:[id number] DOB: Oct 29, 1929 DOA: 12/25/2020 PCP: [name], MD  Brief Narrative:Holly Rose is a 87 y.o. female with medical history significant of memory loss, early dementia, type 2 diabetes mellitus, breast cancer, CVA presented to the ED today with abdominal pain nausea and vomiting which started last night. -She had numerous episodes of vomiting yesterday, including this morning subsequently EMS was called, in the ED she was noted to have significantly elevated lipase of 1926, abnormal AST ALT, normal bilirubin and alkaline phosphatase, white count was normal, urinalysis noted positive nitrite, otherwise unremarkable -CT abdomen pelvis was concerning for acute pancreatitis, also had evidence of intra and extrahepatic biliary ductal dilation without obstructing calculus.   Assessment & Plan:   Acute pancreatitis, likely gallstone -No history of alcoholism, no evidence of pancreatic mass/lesion on CT -Suspect this could be biliary pancreatitis with passed stone -CT without evidence of CBD dilation, normal bilirubin and alkaline phosphatase is reassuring -Right upper quadrant ultrasound indicates gallstones without CBD dilation or obstruction -Clinically improving with supportive care, continue IV fluids cut down rate, start clear liquids -Appreciate gastroenterology input, recommended supportive care for now, she is not a great candidate for cholecystectomy, DC depakote too -CBC, CMP in a.m.  Permanent atrial fibrillation -Continue metoprolol -Continue to hold Eliquis today, temporarily use subcu Lovenox 1 more day  History of CVA -As above  Type 2 diabetes mellitus -Hold Metformin, sliding scale insulin for now  Memory loss, early dementia -Resume Remeron, DC depakote -At risk of delirium  Abnormal urinalysis -Positive nitrite without bacteriuria or pyuria -No clinical symptoms of UTI, monitor off  antibiotics  PVCs -Continue p.o. metoprolol, replaced Mag, monitor on tele  Other -Lasix listed in medrec, patient and spouse do not know why -No echo in our system, holding diuretics at this time   DVT prophylaxis: Full dose Lovenox Code Status: DNR Family Communication: Spouse at bedside Disposition Plan:  Status is: Inpatient  Remains inpatient appropriate because:Inpatient level of care appropriate due to severity of illness   Dispo: The patient is from: Home              Anticipated d/c is to: Home              Patient currently is not medically stable to d/c.   Difficult to place patient No        Consultants:   Gastroenterology   Procedures:   Antimicrobials:    Subjective: -Feels much better, denies any abdominal pain nausea or vomiting  Objective: Vitals:   12/25/20 2156 12/26/20 0413 12/26/20 0800 12/26/20 1213  BP: 130/64 130/72 121/69 122/69  Pulse: (!) 57 99 70 66  Resp: 18 20 18 18   Temp: 97.6 F (36.4 C) 98 F (36.7 C) 97.8 F (36.6 C) 98.3 F (36.8 C)  TempSrc: Oral Oral  Oral  SpO2: 100% 97% 98% 100%  Weight: 55.6 kg     Height: 5\' 3"  (1.6 m)       Intake/Output Summary (Last 24 hours) at 12/26/2020 1444 Last data filed at 12/26/2020 0525 Gross per 24 hour  Intake 1275 ml  Output --  Net 1275 ml   Filed Weights   12/25/20 1419 12/25/20 2156  Weight: 55.3 kg 55.6 kg    Examination:  General exam: Elderly pleasant female sitting up in a recliner, awake alert oriented to self and place, mild cognitive deficits, no distress Respiratory system: Clear to auscultation. Respiratory effort normal. Cardiovascular system:  S1 & S2 heard, RRR.  Gastrointestinal system: Soft, nontender, nondistended, bowel sounds present Central nervous system: Alert and oriented. No focal neurological deficits. Extremities: No edema Skin: No rashes, lesions or ulcers Psychiatry: Poor insight    Data Reviewed:   CBC: Recent Labs  Lab  12/25/20 1429 12/26/20 0534  WBC 8.1 6.5  HGB 10.1* 10.1*  HCT 31.4* 31.2*  MCV 92.9 92.6  PLT 132* 007*   Basic Metabolic Panel: Recent Labs  Lab 12/25/20 1429 12/25/20 1535 12/26/20 0534  NA 137  --  139  K 3.8  --  3.5  CL 104  --  107  CO2 26  --  25  GLUCOSE 121*  --  75  BUN 22  --  18  CREATININE 0.81 0.80 0.62  CALCIUM 8.9  --  8.5*  MG 1.6*  --   --    GFR: Estimated Creatinine Clearance: 39.4 mL/min (by C-G formula based on SCr of 0.62 mg/dL). Liver Function Tests: Recent Labs  Lab 12/25/20 1429 12/26/20 0534  AST 369* 202*  ALT 221* 165*  ALKPHOS 75 75  BILITOT 1.1 0.7  PROT 6.8 6.6  ALBUMIN 3.0* 2.9*   Recent Labs  Lab 12/25/20 1429 12/26/20 0534  LIPASE 1,926* 544*   No results for input(s): AMMONIA in the last 168 hours. Coagulation Profile: Recent Labs  Lab 12/26/20 0534  INR 1.4*   Cardiac Enzymes: No results for input(s): CKTOTAL, CKMB, CKMBINDEX, TROPONINI in the last 168 hours. BNP (last 3 results) No results for input(s): PROBNP in the last 8760 hours. HbA1C: No results for input(s): HGBA1C in the last 72 hours. CBG: Recent Labs  Lab 12/25/20 2224 12/26/20 0801 12/26/20 1210  GLUCAP 90 70 109*   Lipid Profile: No results for input(s): CHOL, HDL, LDLCALC, TRIG, CHOLHDL, LDLDIRECT in the last 72 hours. Thyroid Function Tests: No results for input(s): TSH, T4TOTAL, FREET4, T3FREE, THYROIDAB in the last 72 hours. Anemia Panel: No results for input(s): VITAMINB12, FOLATE, FERRITIN, TIBC, IRON, RETICCTPCT in the last 72 hours. Urine analysis:    Component Value Date/Time   COLORURINE YELLOW (A) 12/25/2020 1605   APPEARANCEUR HAZY (A) 12/25/2020 1605   APPEARANCEUR Clear 06/07/2019 0940   LABSPEC 1.014 12/25/2020 1605   PHURINE 6.0 12/25/2020 1605   GLUCOSEU NEGATIVE 12/25/2020 1605   GLUCOSEU NEGATIVE 05/23/2020 1333   HGBUR NEGATIVE 12/25/2020 [place] 12/25/2020 1605   BILIRUBINUR Negative  06/07/2019 0940   KETONESUR NEGATIVE 12/25/2020 1605   PROTEINUR NEGATIVE 12/25/2020 1605   UROBILINOGEN 0.2 05/23/2020 1333   NITRITE POSITIVE (A) 12/25/2020 1605   LEUKOCYTESUR NEGATIVE 12/25/2020 1605   Sepsis Labs: @LABRCNTIP (procalcitonin:4,lacticidven:4)  ) Recent Results (from the past 240 hour(s))  SARS CORONAVIRUS 2 (TAT 6-24 HRS) Nasopharyngeal Nasopharyngeal Swab     Status: None   Collection Time: 12/25/20  7:57 PM   Specimen: Nasopharyngeal Swab  Result Value Ref Range Status   SARS Coronavirus 2 NEGATIVE NEGATIVE Final    Comment: (NOTE) SARS-CoV-2 target nucleic acids are NOT DETECTED.  The SARS-CoV-2 RNA is generally detectable in upper and lower respiratory specimens during the acute phase of infection. Negative results do not preclude SARS-CoV-2 infection, do not rule out co-infections with other pathogens, and should not be used as the sole basis for treatment or other patient management decisions. Negative results must be combined with clinical observations, patient history, and epidemiological information. The expected result is Negative.  Fact Sheet for Patients: SugarRoll.be  Fact Sheet for  Healthcare Providers: https://www.woods-mathews.com/  This test is not yet approved or cleared by the [place] and  has been authorized for detection and/or diagnosis of SARS-CoV-2 by FDA under an Emergency Use Authorization (EUA). This EUA will remain  in effect (meaning this test can be used) for the duration of the COVID-19 declaration under Se ction 564(b)(1) of the Act, 21 U.S.C. section 360bbb-3(b)(1), unless the authorization is terminated or revoked sooner.  Performed at [facility], [place] [address], [place], [place] [address]   MRSA PCR Screening     Status: None   Collection Time: 12/26/20  1:24 AM   Specimen: Nasopharyngeal  Result Value Ref Range Status   MRSA by PCR NEGATIVE NEGATIVE Final     Comment:        The GeneXpert MRSA Assay (FDA approved for NASAL specimens only), is one component of a comprehensive MRSA colonization surveillance program. It is not intended to diagnose MRSA infection nor to guide or monitor treatment for MRSA infections. Performed at [facility], [address], [place], [place] [address]          Radiology Studies: CT Head Wo Contrast  Result Date: 12/25/2020 CLINICAL DATA:  Nausea and vomiting with recent minor head trauma EXAM: CT HEAD WITHOUT CONTRAST TECHNIQUE: Contiguous axial images were obtained from the base of the skull through the vertex without intravenous contrast. COMPARISON:  07/22/2020 FINDINGS: Brain: Changes consistent with prior right MCA infarct are again noted and stable. Infarct in the right occipital lobe is seen as well. No acute hemorrhage, acute infarction or space-occupying mass lesion is noted. Vascular: No hyperdense vessel or unexpected calcification. Skull: Normal. Negative for fracture or focal lesion. Sinuses/Orbits: No acute finding. Other: None. 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 No acute abnormality noted. Electronically Signed   By: Inez Catalina M.D.   On: 12/25/2020 16:08   CT ABDOMEN PELVIS W CONTRAST  Result Date: 12/25/2020 CLINICAL DATA:  Abdominal distension, vomiting. EXAM: CT ABDOMEN AND PELVIS WITH CONTRAST TECHNIQUE: Multidetector CT imaging of the abdomen and pelvis was performed using the standard protocol following bolus administration of intravenous contrast. CONTRAST:  62mL OMNIPAQUE IOHEXOL 300 MG/ML  SOLN COMPARISON:  March 27, 2016. FINDINGS: Lower chest: Small right pleural effusion is noted with adjacent subsegmental atelectasis. Hepatobiliary: Mild gallbladder distention is noted without cholelithiasis. Minimal intrahepatic and extrahepatic biliary dilatation is noted without obstructing calculus. Liver is otherwise unremarkable. Pancreas: Mild  amount of fluid is seen surrounding the pancreas concerning for acute pancreatitis. No ductal dilatation or pseudocyst formation is noted. Spleen: Normal in size without focal abnormality. Adrenals/Urinary Tract: Adrenal glands appear normal. Small nonobstructive calculus is noted in lower pole collecting system of left kidney. Small right renal cyst is noted. Mild right-sided hydroureteronephrosis is noted without obstructing calculus. Mild urinary bladder distention is noted. Stomach/Bowel: The stomach appears normal. There appears to be wall and fold thickening involving portions of the duodenum which may represent peptic ulcer disease or inflammation secondary to pancreatitis. Sigmoid diverticulosis is noted without inflammation. No definite evidence of bowel obstruction. Vascular/Lymphatic: Aortic atherosclerosis. No enlarged abdominal or pelvic lymph nodes. Reproductive: Status post hysterectomy. No adnexal masses. Other: No definite hernia is noted. Small amount of free fluid is noted in the pelvis. Musculoskeletal: No acute or significant osseous findings. IMPRESSION: Mild amount of fluid is seen surround the pancreas concerning for acute pancreatitis. No definite pseudocyst formation is noted. There appears to be wall and fold  thickening involving portions of the adjacent duodenum which may represent peptic ulcer disease or inflammation secondary to pancreatitis. Small right pleural effusion is noted with adjacent subsegmental atelectasis. Minimal intrahepatic and extrahepatic biliary dilatation is noted without obstructing calculus. Mild gallbladder distention is noted without cholelithiasis. Correlation with liver function tests is recommended to rule out obstruction. Mild right-sided hydroureteronephrosis is noted without obstructing calculus. Mild urinary bladder distention is noted. Aortic Atherosclerosis (ICD10-I70.0). Electronically Signed   By: Marijo Conception M.D.   On: 12/25/2020 16:18   US  Abdomen Limited RUQ (LIVER/GB)  Result Date: 12/25/2020 CLINICAL DATA:  Pancreatitis EXAM: ULTRASOUND ABDOMEN LIMITED RIGHT UPPER QUADRANT COMPARISON:  CT 12/25/2020 FINDINGS: Gallbladder: Shadowing stones measuring up to 5 mm. Normal wall thickness. No sonographic Murphy. Common bile duct: Diameter: 3.4 mm Liver: No focal lesion identified. Within normal limits in parenchymal echogenicity. Portal vein is patent on color Doppler imaging with normal direction of blood flow towards the liver. Other: Small right pleural effusion. Right kidney may be slightly echogenic. IMPRESSION: 1. Gallstones without evidence for acute cholecystitis. 2. Small right pleural effusion 3. Right kidney may be slightly echogenic, correlate with appropriate laboratory values. Electronically Signed   By: Donavan Foil M.D.   On: 12/25/2020 19:40        Scheduled Meds: . divalproex  125 mg Oral QHS  . enoxaparin (LOVENOX) injection  1 mg/kg Subcutaneous Q12H  . insulin aspart  0-9 Units Subcutaneous TID WC  . metoprolol tartrate  25 mg Oral BID  . mirtazapine  7.5 mg Oral QHS  . tamoxifen  20 mg Oral Daily   Continuous Infusions: . sodium chloride 100 mL/hr at 12/26/20 0525     LOS: 1 day    Time spent: 29min    Domenic Polite, MD [facility]  12/26/2020, 2:44 PM

## 2020-12-26 NOTE — Progress Notes (Signed)
Pt refusing her tele at this time, cursing and pulling at wires.  Will try again when pt is in better spirits.

## 2020-12-27 DIAGNOSIS — F0150 Vascular dementia without behavioral disturbance: Secondary | ICD-10-CM | POA: Diagnosis not present

## 2020-12-27 DIAGNOSIS — K859 Acute pancreatitis without necrosis or infection, unspecified: Secondary | ICD-10-CM | POA: Diagnosis not present

## 2020-12-27 LAB — CBC
HCT: 30.1 % — ABNORMAL LOW (ref 36.0–46.0)
Hemoglobin: 9.7 g/dL — ABNORMAL LOW (ref 12.0–15.0)
MCH: 29.8 pg (ref 26.0–34.0)
MCHC: 32.2 g/dL (ref 30.0–36.0)
MCV: 92.3 fL (ref 80.0–100.0)
Platelets: 115 10*3/uL — ABNORMAL LOW (ref 150–400)
RBC: 3.26 MIL/uL — ABNORMAL LOW (ref 3.87–5.11)
RDW: 17.9 % — ABNORMAL HIGH (ref 11.5–15.5)
WBC: 5.6 10*3/uL (ref 4.0–10.5)
nRBC: 0 % (ref 0.0–0.2)

## 2020-12-27 LAB — COMPREHENSIVE METABOLIC PANEL
ALT: 102 U/L — ABNORMAL HIGH (ref 0–44)
AST: 93 U/L — ABNORMAL HIGH (ref 15–41)
Albumin: 2.4 g/dL — ABNORMAL LOW (ref 3.5–5.0)
Alkaline Phosphatase: 64 U/L (ref 38–126)
Anion gap: 5 (ref 5–15)
BUN: 11 mg/dL (ref 8–23)
CO2: 25 mmol/L (ref 22–32)
Calcium: 8.3 mg/dL — ABNORMAL LOW (ref 8.9–10.3)
Chloride: 109 mmol/L (ref 98–111)
Creatinine, Ser: 0.5 mg/dL (ref 0.44–1.00)
GFR, Estimated: >60 mL/min (ref >60)
Glucose, Bld: 77 mg/dL (ref 70–99)
Potassium: 3.7 mmol/L (ref 3.5–5.1)
Sodium: 139 mmol/L (ref 135–145)
Total Bilirubin: 0.7 mg/dL (ref 0.3–1.2)
Total Protein: 5.9 g/dL — ABNORMAL LOW (ref 6.5–8.1)

## 2020-12-27 LAB — GLUCOSE, CAPILLARY
Glucose-Capillary: 87 mg/dL (ref 70–99)
Glucose-Capillary: 81 mg/dL (ref 70–99)
Glucose-Capillary: 118 mg/dL — ABNORMAL HIGH (ref 70–99)

## 2020-12-27 MED ORDER — HALOPERIDOL LACTATE 5 MG/ML IJ SOLN
2.0000 mg | Freq: Four times a day (QID) | INTRAMUSCULAR | Status: DC | PRN
  Administered 2020-12-27: 2 mg via INTRAMUSCULAR
  Filled 2020-12-27: qty 1

## 2020-12-27 MED ORDER — APIXABAN 2.5 MG PO TABS
2.5000 mg | ORAL_TABLET | Freq: Two times a day (BID) | ORAL | Status: DC
  Administered 2020-12-27 – 2020-12-28 (×3): 2.5 mg via ORAL
  Filled 2020-12-27 (×3): qty 1

## 2020-12-27 NOTE — Progress Notes (Signed)
Mobility Specialist - Progress Note   12/27/20 1200  Mobility  Activity Ambulated in room  Level of Assistance Minimal assist, patient does 75% or more  Assistive Device Front wheel walker  Distance Ambulated (ft) 80 ft  Mobility Response Tolerated well  Mobility performed by Mobility specialist  $Mobility charge 1 Mobility    Pre-mobility: 80 HR, 97% SpO2 During mobility: 87 HR, 94% SpO2 Post-mobility: 80 HR, 97% SpO2   Pt ambulated in room with RW. Spouse at bedside. Supervision and extra time to get EOB. No dizziness. MinA to stand, cues for hand placement. Pt would carry over commands occasionally. Mild LOB noted x1. Denied SOB on RA. Pt slightly unsteady with ambulation. MinA to steer as pt would often bump into stationary objects.   Kathee Delton Mobility Specialist 12/27/20, 12:40 PM

## 2020-12-27 NOTE — Care Management Important Message (Signed)
Important Message  Patient Details  Name: Holly Rose MRN: [id number] Date of Birth: [birth year]   Medicare Important Message Given:  Yes     Dannette Barbara 12/27/2020, 12:29 PM

## 2020-12-27 NOTE — Evaluation (Signed)
Physical Therapy Evaluation Patient Details Name: Holly Rose MRN: [id number] DOB: Nov 06, 1929 Today's Date: 12/27/2020   History of Present Illness  Pt is an 87 y/o F admitted on 12/25/20 with c/c of N&V & abdominal pain. CT of abdomen & pelvis were concerning for acute pancreatitis. PMH: early dementia, DM, L breast CA, CVA, arthritis, CAD, heart attack, SSS, HLD, macular degeneration, neuroapathy, pacemaker, TIA  Clinical Impression  Pt seen for PT evaluation with pt received sitting in standard chair in room behind computer with pt's husband reporting she got here unassisted. Pt is HOH but also appears confused, unsure of situation, requiring MAX cuing for safety with mobility, and doesn't recognize her husband upon return to room. Pt is able to ambulate with RW & CGA<>close supervision with extremely shuffled gait. PT educates pt's husband on need to clear throw rugs from floor to reduce tripping hazard. Will continue to follow pt acutely to focus on balance, safety with gait, and endurance. Pt will require supervision for OOB mobility upon return home.     Follow Up Recommendations Home health PT;Supervision for mobility/OOB    Equipment Recommendations  Rolling walker with 5" wheels    Recommendations for Other Services       Precautions / Restrictions Precautions Precautions: Fall Restrictions Weight Bearing Restrictions: No      Mobility  Bed Mobility Overal bed mobility: Needs Assistance Bed Mobility: Sit to Supine       Sit to supine: Supervision   General bed mobility comments: extra time & effort to elevate BLE onto bed    Transfers Overall transfer level: Needs assistance   Transfers: Sit to/from Stand Sit to Stand: Supervision         General transfer comment: poor awareness of safe hand placement as pt with BUE on RW to pull to standing  Ambulation/Gait Ambulation/Gait assistance: Supervision;Min guard Gait Distance (Feet): 120 Feet Assistive  device: Rolling walker (2 wheeled) Gait Pattern/deviations: Decreased step length - right;Decreased step length - left;Decreased dorsiflexion - left;Decreased dorsiflexion - right;Decreased stride length;Shuffle Gait velocity: decreased   General Gait Details: minimal to no BLE foot clearance, as pt quite literally shuffling feet across floor  Stairs            Wheelchair Mobility    Modified Rankin (Stroke Patients Only)       Balance Overall balance assessment: Needs assistance Sitting-balance support: Feet supported Sitting balance-Leahy Scale: Good     Standing balance support: Bilateral upper extremity supported;During functional activity Standing balance-Leahy Scale: Fair                               Pertinent Vitals/Pain Pain Assessment: Faces Faces Pain Scale: No hurt    Home Living Family/patient expects to be discharged to:: Private residence Living Arrangements: Spouse/significant other Available Help at Discharge: Family;Available 24 hours/day Type of Home: Independent living facility Home Access: Level entry     Home Layout: One level Home Equipment: Walker - 2 wheels Additional Comments: typically uses RW at home    Prior Function Level of Independence: Independent with assistive device(s)         Comments: lives with husband who also uses AD for mobility     Hand Dominance        Extremity/Trunk Assessment   Upper Extremity Assessment Upper Extremity Assessment: Generalized weakness    Lower Extremity Assessment Lower Extremity Assessment: Generalized weakness  Communication   Communication: HOH  Cognition Arousal/Alertness: Awake/alert Behavior During Therapy: WFL for tasks assessed/performed Overall Cognitive Status: History of cognitive impairments - at baseline (pt's spouse reports confusion is baseline for the pt, but "not quite as bad as it is today") Area of Impairment:  Orientation;Attention;Memory;Following commands;Safety/judgement;Awareness;Problem solving                 Orientation Level: Disoriented to;Situation;Place   Memory: Decreased recall of precautions Following Commands: Follows one step commands consistently;Follows one step commands with increased time Safety/Judgement: Decreased awareness of deficits;Decreased awareness of safety Awareness: Anticipatory;Emergent;Intellectual Problem Solving: Requires verbal cues;Slow processing        General Comments General comments (skin integrity, edema, etc.): HR up to 127 bpm with gait    Exercises     Assessment/Plan    PT Assessment Patient needs continued PT services  PT Problem List Decreased strength;Decreased mobility;Decreased safety awareness;Decreased activity tolerance;Decreased cognition;Decreased balance;Decreased knowledge of use of DME       PT Treatment Interventions DME instruction;Therapeutic activities;Cognitive remediation;Gait training;Therapeutic exercise;Patient/family education;Balance training;Functional mobility training;Neuromuscular re-education;Manual techniques    PT Goals (Current goals can be found in the Care Plan section)  Acute Rehab PT Goals Patient Stated Goal: "to get warm" PT Goal Formulation: With patient/family Time For Goal Achievement: 01/10/21 Potential to Achieve Goals: Fair    Frequency Min 2X/week   Barriers to discharge Decreased caregiver support unsure if husband can provide physical assist at d/c    Co-evaluation               AM-PAC PT "6 Clicks" Mobility  Outcome Measure Help needed turning from your back to your side while in a flat bed without using bedrails?: None Help needed moving from lying on your back to sitting on the side of a flat bed without using bedrails?: A Little Help needed moving to and from a bed to a chair (including a wheelchair)?: A Little Help needed standing up from a chair using your arms  (e.g., wheelchair or bedside chair)?: None Help needed to walk in hospital room?: A Little Help needed climbing 3-5 steps with a railing? : A Lot 6 Click Score: 19    End of Session   Activity Tolerance: Patient tolerated treatment well Patient left: in bed;with call bell/phone within reach;with bed alarm set;with nursing/sitter in room;with family/visitor present Nurse Communication: Mobility status PT Visit Diagnosis: Unsteadiness on feet (R26.81);Muscle weakness (generalized) (M62.81)    Time: 5686-1683 PT Time Calculation (min) (ACUTE ONLY): 13 min   Charges:   PT Evaluation $PT Eval Low Complexity: [place], PT, DPT 12/27/20, 3:36 PM   Waunita Schooner 12/27/2020, 3:33 PM

## 2020-12-27 NOTE — Progress Notes (Signed)
PROGRESS NOTE    Holly Rose  YDX:[id number] DOB: 03/12/1930 DOA: 12/25/2020 PCP: [name], MD  Brief Narrative:Holly Rose is a 87 y.o. female with medical history significant of memory loss, early dementia, type 2 diabetes mellitus, breast cancer, CVA presented to the ED today with abdominal pain nausea and vomiting which started last night. -She had numerous episodes of vomiting yesterday, including this morning subsequently EMS was called, in the ED she was noted to have significantly elevated lipase of 1926, abnormal AST ALT, normal bilirubin and alkaline phosphatase, white count was normal, urinalysis noted positive nitrite, otherwise unremarkable -CT abdomen pelvis was concerning for acute pancreatitis, also had evidence of intra and extrahepatic biliary ductal dilation without obstructing calculus.   Assessment & Plan:   Acute pancreatitis, likely gallstone -No history of alcoholism, no evidence of pancreatic mass/lesion on CT -Suspect biliary pancreatitis with passed stone -CT without evidence of CBD dilation, normal bilirubin and alkaline phosphatase is reassuring -Right upper quadrant ultrasound indicates gallstones without CBD dilation or obstruction -Appreciate gastroenterology input, clinically improving with supportive care, notes symptoms now -Advance to full liquids and then soft diet, discontinue IV fluids -Depakote discontinued  Permanent atrial fibrillation -Continue metoprolol -Resume Eliquis  History of CVA -As above  Type 2 diabetes mellitus -Hold Metformin, sliding scale insulin for now  Memory loss, early dementia -Resume Remeron, Depakote held due to small risk of pancreatitis -At risk of delirium  Abnormal urinalysis -Positive nitrite without bacteriuria or pyuria -No clinical symptoms of UTI, monitor off antibiotics  PVCs -Continue p.o. metoprolol, replaced Mag, monitor on tele  Other -Lasix listed in medrec, patient and  spouse do not know why -No echo in our system, holding diuretics at this time   DVT prophylaxis: Eliquis Code Status: DNR Family Communication: No family at bedside Disposition Plan:  Status is: Inpatient  Remains inpatient appropriate because:Inpatient level of care appropriate due to severity of illness   Dispo: The patient is from: Home              Anticipated d/c is to: Home hopefully tomorrow              Patient currently is not medically stable to d/c.   Difficult to place patient No  Consultants:   Gastroenterology   Procedures:   Antimicrobials:    Subjective: -Some confusion and agitation overnight, received Ativan early this morning  Objective: Vitals:   12/26/20 2023 12/27/20 0636 12/27/20 0832 12/27/20 1142  BP: 124/74 123/65 114/84 123/72  Pulse: (!) 49 71 86 68  Resp: 18 18 16 17   Temp: 98.3 F (36.8 C) (!) 97.4 F (36.3 C) 98 F (36.7 C) 97.7 F (36.5 C)  TempSrc: Oral Oral    SpO2: 94% 95% 96% 98%  Weight:      Height:        Intake/Output Summary (Last 24 hours) at 12/27/2020 1242 Last data filed at 12/27/2020 7209 Gross per 24 hour  Intake 1300 ml  Output 0 ml  Net 1300 ml   Filed Weights   12/25/20 1419 12/25/20 2156  Weight: 55.3 kg 55.6 kg    Examination:  General exam: Elderly female, somnolent arousable, irritable oriented to self and place, mild cognitive deficits CVS: S1-S2, regular rate rhythm Lungs: Decreased breath sounds to bases otherwise clear Abdomen: Soft, mildly distended, nontender, bowel sounds present Extremities: No edema  Skin: No rashes, lesions or ulcers Psychiatry: Poor insight    Data Reviewed:   CBC: Recent Labs  Lab 12/25/20 1429 12/26/20 0534 12/27/20 0650  WBC 8.1 6.5 5.6  HGB 10.1* 10.1* 9.7*  HCT 31.4* 31.2* 30.1*  MCV 92.9 92.6 92.3  PLT 132* 127* 938*   Basic Metabolic Panel: Recent Labs  Lab 12/25/20 1429 12/25/20 1535 12/26/20 0534 12/27/20 0650  NA 137  --  139 139  K  3.8  --  3.5 3.7  CL 104  --  107 109  CO2 26  --  25 25  GLUCOSE 121*  --  75 77  BUN 22  --  18 11  CREATININE 0.81 0.80 0.62 0.50  CALCIUM 8.9  --  8.5* 8.3*  MG 1.6*  --   --   --    GFR: Estimated Creatinine Clearance: 39.4 mL/min (by C-G formula based on SCr of 0.5 mg/dL). Liver Function Tests: Recent Labs  Lab 12/25/20 1429 12/26/20 0534 12/27/20 0650  AST 369* 202* 93*  ALT 221* 165* 102*  ALKPHOS 75 75 64  BILITOT 1.1 0.7 0.7  PROT 6.8 6.6 5.9*  ALBUMIN 3.0* 2.9* 2.4*   Recent Labs  Lab 12/25/20 1429 12/26/20 0534  LIPASE 1,926* 544*   No results for input(s): AMMONIA in the last 168 hours. Coagulation Profile: Recent Labs  Lab 12/26/20 0534  INR 1.4*   Cardiac Enzymes: No results for input(s): CKTOTAL, CKMB, CKMBINDEX, TROPONINI in the last 168 hours. BNP (last 3 results) No results for input(s): PROBNP in the last 8760 hours. HbA1C: No results for input(s): HGBA1C in the last 72 hours. CBG: Recent Labs  Lab 12/26/20 0801 12/26/20 1210 12/26/20 1750 12/27/20 0833 12/27/20 1142  GLUCAP 70 109* 112* 87 81   Lipid Profile: No results for input(s): CHOL, HDL, LDLCALC, TRIG, CHOLHDL, LDLDIRECT in the last 72 hours. Thyroid Function Tests: No results for input(s): TSH, T4TOTAL, FREET4, T3FREE, THYROIDAB in the last 72 hours. Anemia Panel: No results for input(s): VITAMINB12, FOLATE, FERRITIN, TIBC, IRON, RETICCTPCT in the last 72 hours. Urine analysis:    Component Value Date/Time   COLORURINE YELLOW (A) 12/25/2020 1605   APPEARANCEUR HAZY (A) 12/25/2020 1605   APPEARANCEUR Clear 06/07/2019 0940   LABSPEC 1.014 12/25/2020 1605   PHURINE 6.0 12/25/2020 1605   GLUCOSEU NEGATIVE 12/25/2020 1605   GLUCOSEU NEGATIVE 05/23/2020 1333   HGBUR NEGATIVE 12/25/2020 [place] 12/25/2020 1605   BILIRUBINUR Negative 06/07/2019 0940   KETONESUR NEGATIVE 12/25/2020 1605   PROTEINUR NEGATIVE 12/25/2020 1605   UROBILINOGEN 0.2 05/23/2020  1333   NITRITE POSITIVE (A) 12/25/2020 1605   LEUKOCYTESUR NEGATIVE 12/25/2020 1605   Sepsis Labs: @LABRCNTIP (procalcitonin:4,lacticidven:4)  ) Recent Results (from the past 240 hour(s))  SARS CORONAVIRUS 2 (TAT 6-24 HRS) Nasopharyngeal Nasopharyngeal Swab     Status: None   Collection Time: 12/25/20  7:57 PM   Specimen: Nasopharyngeal Swab  Result Value Ref Range Status   SARS Coronavirus 2 NEGATIVE NEGATIVE Final    Comment: (NOTE) SARS-CoV-2 target nucleic acids are NOT DETECTED.  The SARS-CoV-2 RNA is generally detectable in upper and lower respiratory specimens during the acute phase of infection. Negative results do not preclude SARS-CoV-2 infection, do not rule out co-infections with other pathogens, and should not be used as the sole basis for treatment or other patient management decisions. Negative results must be combined with clinical observations, patient history, and epidemiological information. The expected result is Negative.  Fact Sheet for Patients: SugarRoll.be  Fact Sheet for Healthcare Providers: https://www.woods-mathews.com/  This test is not yet approved or cleared by  the [employer] and  has been authorized for detection and/or diagnosis of SARS-CoV-2 by FDA under an Emergency Use Authorization (EUA). This EUA will remain  in effect (meaning this test can be used) for the duration of the COVID-19 declaration under Se ction 564(b)(1) of the Act, 21 U.S.C. section 360bbb-3(b)(1), unless the authorization is terminated or revoked sooner.  Performed at [facility], [place] [address] [place], [place] [address]   MRSA PCR Screening     Status: None   Collection Time: 12/26/20  1:24 AM   Specimen: Nasopharyngeal  Result Value Ref Range Status   MRSA by PCR NEGATIVE NEGATIVE Final    Comment:        The GeneXpert MRSA Assay (FDA approved for NASAL specimens only), is one component of  a comprehensive MRSA colonization surveillance program. It is not intended to diagnose MRSA infection nor to guide or monitor treatment for MRSA infections. Performed at [facility], [address], [place], [place] [address]          Radiology Studies: CT Head Wo Contrast  Result Date: 12/25/2020 CLINICAL DATA:  Nausea and vomiting with recent minor head trauma EXAM: CT HEAD WITHOUT CONTRAST TECHNIQUE: Contiguous axial images were obtained from the base of the skull through the vertex without intravenous contrast. COMPARISON:  07/22/2020 FINDINGS: Brain: Changes consistent with prior right MCA infarct are again noted and stable. Infarct in the right occipital lobe is seen as well. No acute hemorrhage, acute infarction or space-occupying mass lesion is noted. Vascular: No hyperdense vessel or unexpected calcification. Skull: Normal. Negative for fracture or focal lesion. Sinuses/Orbits: No acute finding. Other: None. 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 No acute abnormality noted. Electronically Signed   By: Inez Catalina M.D.   On: 12/25/2020 16:08   CT ABDOMEN PELVIS W CONTRAST  Result Date: 12/25/2020 CLINICAL DATA:  Abdominal distension, vomiting. EXAM: CT ABDOMEN AND PELVIS WITH CONTRAST TECHNIQUE: Multidetector CT imaging of the abdomen and pelvis was performed using the standard protocol following bolus administration of intravenous contrast. CONTRAST:  64mL OMNIPAQUE IOHEXOL 300 MG/ML  SOLN COMPARISON:  March 27, 2016. FINDINGS: Lower chest: Small right pleural effusion is noted with adjacent subsegmental atelectasis. Hepatobiliary: Mild gallbladder distention is noted without cholelithiasis. Minimal intrahepatic and extrahepatic biliary dilatation is noted without obstructing calculus. Liver is otherwise unremarkable. Pancreas: Mild amount of fluid is seen surrounding the pancreas concerning for acute pancreatitis. No ductal dilatation or  pseudocyst formation is noted. Spleen: Normal in size without focal abnormality. Adrenals/Urinary Tract: Adrenal glands appear normal. Small nonobstructive calculus is noted in lower pole collecting system of left kidney. Small right renal cyst is noted. Mild right-sided hydroureteronephrosis is noted without obstructing calculus. Mild urinary bladder distention is noted. Stomach/Bowel: The stomach appears normal. There appears to be wall and fold thickening involving portions of the duodenum which may represent peptic ulcer disease or inflammation secondary to pancreatitis. Sigmoid diverticulosis is noted without inflammation. No definite evidence of bowel obstruction. Vascular/Lymphatic: Aortic atherosclerosis. No enlarged abdominal or pelvic lymph nodes. Reproductive: Status post hysterectomy. No adnexal masses. Other: No definite hernia is noted. Small amount of free fluid is noted in the pelvis. Musculoskeletal: No acute or significant osseous findings. IMPRESSION: Mild amount of fluid is seen surround the pancreas concerning for acute pancreatitis. No definite pseudocyst formation is noted. There appears to be wall and fold thickening involving portions of the adjacent duodenum which may represent peptic ulcer disease  or inflammation secondary to pancreatitis. Small right pleural effusion is noted with adjacent subsegmental atelectasis. Minimal intrahepatic and extrahepatic biliary dilatation is noted without obstructing calculus. Mild gallbladder distention is noted without cholelithiasis. Correlation with liver function tests is recommended to rule out obstruction. Mild right-sided hydroureteronephrosis is noted without obstructing calculus. Mild urinary bladder distention is noted. Aortic Atherosclerosis (ICD10-I70.0). Electronically Signed   By: Marijo Conception M.D.   On: 12/25/2020 16:18   US Abdomen Limited RUQ (LIVER/GB)  Result Date: 12/25/2020 CLINICAL DATA:  Pancreatitis EXAM: ULTRASOUND ABDOMEN  LIMITED RIGHT UPPER QUADRANT COMPARISON:  CT 12/25/2020 FINDINGS: Gallbladder: Shadowing stones measuring up to 5 mm. Normal wall thickness. No sonographic Murphy. Common bile duct: Diameter: 3.4 mm Liver: No focal lesion identified. Within normal limits in parenchymal echogenicity. Portal vein is patent on color Doppler imaging with normal direction of blood flow towards the liver. Other: Small right pleural effusion. Right kidney may be slightly echogenic. IMPRESSION: 1. Gallstones without evidence for acute cholecystitis. 2. Small right pleural effusion 3. Right kidney may be slightly echogenic, correlate with appropriate laboratory values. Electronically Signed   By: Donavan Foil M.D.   On: 12/25/2020 19:40   Scheduled Meds: . apixaban  2.5 mg Oral BID  . insulin aspart  0-9 Units Subcutaneous TID WC  . metoprolol tartrate  25 mg Oral BID  . mirtazapine  7.5 mg Oral QHS  . tamoxifen  20 mg Oral Daily   Continuous Infusions:    LOS: 2 days    Time spent: 85min  Domenic Polite, MD [facility]  12/27/2020, 12:42 PM

## 2020-12-27 NOTE — Care Plan (Signed)
Saw and evaluated patient. Liver numbers improved.  - continue supportive care - advance diet as tolerated  Will sign-off. Please call if any questions or concerns.  Raylene Miyamoto MD, MPH [place]

## 2020-12-28 DIAGNOSIS — K859 Acute pancreatitis without necrosis or infection, unspecified: Secondary | ICD-10-CM | POA: Diagnosis not present

## 2020-12-28 DIAGNOSIS — F0150 Vascular dementia without behavioral disturbance: Secondary | ICD-10-CM | POA: Diagnosis not present

## 2020-12-28 LAB — COMPREHENSIVE METABOLIC PANEL
ALT: 83 U/L — ABNORMAL HIGH (ref 0–44)
AST: 67 U/L — ABNORMAL HIGH (ref 15–41)
Albumin: 2.4 g/dL — ABNORMAL LOW (ref 3.5–5.0)
Alkaline Phosphatase: 68 U/L (ref 38–126)
Anion gap: 6 (ref 5–15)
BUN: 11 mg/dL (ref 8–23)
CO2: 24 mmol/L (ref 22–32)
Calcium: 8.4 mg/dL — ABNORMAL LOW (ref 8.9–10.3)
Chloride: 108 mmol/L (ref 98–111)
Creatinine, Ser: 0.52 mg/dL (ref 0.44–1.00)
GFR, Estimated: >60 mL/min (ref >60)
Glucose, Bld: 102 mg/dL — ABNORMAL HIGH (ref 70–99)
Potassium: 4.2 mmol/L (ref 3.5–5.1)
Sodium: 138 mmol/L (ref 135–145)
Total Bilirubin: 0.8 mg/dL (ref 0.3–1.2)
Total Protein: 5.8 g/dL — ABNORMAL LOW (ref 6.5–8.1)

## 2020-12-28 LAB — CBC
HCT: 32.1 % — ABNORMAL LOW (ref 36.0–46.0)
Hemoglobin: 10.3 g/dL — ABNORMAL LOW (ref 12.0–15.0)
MCH: 29.9 pg (ref 26.0–34.0)
MCHC: 32.1 g/dL (ref 30.0–36.0)
MCV: 93.3 fL (ref 80.0–100.0)
Platelets: 121 10*3/uL — ABNORMAL LOW (ref 150–400)
RBC: 3.44 MIL/uL — ABNORMAL LOW (ref 3.87–5.11)
RDW: 17.7 % — ABNORMAL HIGH (ref 11.5–15.5)
WBC: 6.2 10*3/uL (ref 4.0–10.5)
nRBC: 0 % (ref 0.0–0.2)

## 2020-12-28 LAB — GLUCOSE, CAPILLARY: Glucose-Capillary: 106 mg/dL — ABNORMAL HIGH (ref 70–99)

## 2020-12-28 MED ORDER — ACETAMINOPHEN 325 MG PO TABS: 650.0000 mg | ORAL_TABLET | Freq: Four times a day (QID) | ORAL | Status: AC | PRN

## 2020-12-28 MED ORDER — DIVALPROEX SODIUM 125 MG PO DR TAB: 125.0000 mg | DELAYED_RELEASE_TABLET | Freq: Every evening | ORAL | Status: DC

## 2020-12-28 MED ORDER — FUROSEMIDE 20 MG PO TABS: 20.0000 mg | ORAL_TABLET | Freq: Every day | ORAL | Status: DC

## 2020-12-28 NOTE — Discharge Summary (Signed)
Physician Discharge Summary  Holly Rose WSF:[id number] DOB: Jun 10, 1930 DOA: 12/25/2020  PCP: [name], MD  Admit date: 12/25/2020 Discharge date: 12/28/2020  Time spent: 35 minutes  Recommendations for Outpatient Follow-up:  1. PCP in 1 week   Discharge Diagnoses:  Acute gallstone pancreatitis Cholelithiasis Mild cognitive deficits Delirium   Paroxysmal A-fib (HCC)   Diabetes mellitus with neuropathy (HCC)   Type II diabetes mellitus ([place])   Stroke ([place])   Dementia without behavioral disturbance ([place])   Pancreatitis, acute   Pancreatitis DO NOT RESUSCITATE   Discharge Condition: Improved and stable  Diet recommendation: Diabetic, low-fat diet  Filed Weights   12/25/20 1419 12/25/20 2156 12/28/20 0817  Weight: 55.3 kg 55.6 kg 55.4 kg    History of present illness:  Holly A Parsonsis a 87 y.o.femalewith medical history significant ofmemory loss, early dementia, type 2 diabetes mellitus, breast cancer, CVA presented to the ED today with abdominal pain nausea and vomiting which started last night. -She had numerous episodes of vomiting yesterday, including this morning subsequently EMS was called, in the ED she was noted to have significantly elevated lipase of 1926, abnormal AST ALT, normal bilirubin and alkaline phosphatase, white count was normal,urinalysis noted positive nitrite, otherwise unremarkable -CT abdomen pelvis was concerning for acute pancreatitis, also had evidence of intra and extrahepatic biliary ductal dilation without obstructing calculus.  Hospital Course:   Acute pancreatitis, likely gallstone -No history of alcoholism, no evidence of pancreatic mass/lesion on CT -Suspect biliary pancreatitis with passedstone -CT without evidence of CBD dilation, normal bilirubin and alkaline phosphatase is reassuring, Right upper quadrant ultrasound noted small gallstones without CBD dilation or obstruction. -Gastroenterology consulted, seen by Dr.  Alice Reichert, recommended conservative management, due to advanced age and cognitive deficits not felt to be an ideal candidate for cholecystectomy at this time unless she were to have recurrence. - clinically improved with supportive care, no further symptoms, tolerating regular diet at the time of discharge, LFTs trending down, bilirubin and alkaline phosphatase has been normal throughout this admission -Discharged home in a stable condition back with her husband to her assisted living facility -Follow-up with PCP in 1 week  Permanent atrial fibrillation -Continue metoprolol -Continue Eliquis  History of CVA -As above, continue Eliquis  Type 2 diabetes mellitus -Stable, Metformin resumed  Memory loss, early dementia -Resume Remeron,  Depakote continued  PVCs -Continue p.o. metoprolol, replaced Mag  Other meds -Lasixlisted in medrec, patient and spouse do not know why -No echo in our system, resume Lasix at discharge, dose decreased to 20 mg daily from twice daily    Discharge Exam: Vitals:   12/28/20 0550 12/28/20 0812  BP: 135/70 119/75  Pulse: 86 89  Resp: 20 18  Temp: 98.4 F (36.9 C) 97.8 F (36.6 C)  SpO2: 96% 98%    General: AAOx2 Cardiovascular: S1-S2, regular rate rhythm Respiratory: Clear  Discharge Instructions   Discharge Instructions    Diet - low sodium heart healthy   Complete by: As directed    Increase activity slowly   Complete by: As directed      Allergies as of 12/28/2020      Reactions   Lyrica [pregabalin] Swelling   Sleepy, does not eat   Neurontin [gabapentin] Nausea And Vomiting, Other (See Comments)   disoriented   Bactrim [sulfamethoxazole-trimethoprim]    Made her dizziness      Medication List    STOP taking these medications   doxycycline 100 MG tablet Commonly known as: VIBRA-TABS  TAKE these medications   acetaminophen 325 MG tablet Commonly known as: TYLENOL Take 2 tablets (650 mg total) by mouth every 6  (six) hours as needed for mild pain (or Fever >/= 101).   Aflibercept 2 MG/0.05ML Soln 1 each by Intravitreal route as directed.   alendronate 70 MG tablet Commonly known as: FOSAMAX Take 1 tablet (70 mg total) by mouth once a week. Take with a full glass of water on an empty stomach.   apixaban 2.5 MG Tabs tablet Commonly known as: ELIQUIS Take 1 tablet (2.5 mg total) by mouth 2 (two) times daily.   atorvastatin 40 MG tablet Commonly known as: LIPITOR Take 1 tablet (40 mg total) by mouth daily.   blood glucose meter kit and supplies Per patient please dispense One touch Ultra2 meter.Use meter and supplies three times a day to check blood sugar. ICD10: E11.40   divalproex 125 MG DR tablet Commonly known as: Depakote Take 1 tablet (125 mg total) by mouth at bedtime. What changed:   how much to take  how to take this  when to take this  additional instructions   freestyle lancets 2 (two) times daily.   FreeStyle Lite Devi 2 (two) times daily.   FREESTYLE LITE test strip Generic drug: glucose blood Used to check blood sugars twice a day. DX E11.9   furosemide 20 MG tablet Commonly known as: LASIX Take 1 tablet (20 mg total) by mouth daily. What changed: when to take this   HAIR/SKIN/NAILS PO Take 3 tablets by mouth daily. 2 in the am, 1 in the pm   isosorbide mononitrate 30 MG 24 hr tablet Commonly known as: IMDUR Take 1 tablet (30 mg total) by mouth daily.   Magnesium 200 MG Tabs Take 200 mg by mouth 2 (two) times a day.   meclizine 12.5 MG tablet Commonly known as: ANTIVERT Take 1 tablet (12.5 mg total) by mouth 2 (two) times daily as needed for dizziness.   metFORMIN 500 MG 24 hr tablet Commonly known as: GLUCOPHAGE-XR Take 1 tablet by mouth twice daily   metoprolol tartrate 25 MG tablet Commonly known as: LOPRESSOR Take 25 mg by mouth 2 (two) times daily.   mirtazapine 7.5 MG tablet Commonly known as: REMERON TAKE 1 TABLET BY MOUTH AT BEDTIME    mupirocin ointment 2 % Commonly known as: BACTROBAN Apply 1 application topically daily.   nitroGLYCERIN 0.4 MG SL tablet Commonly known as: NITROSTAT Place 0.4 mg under the tongue every 5 (five) minutes as needed for chest pain.   OVER THE COUNTER MEDICATION Take 2 tablets by mouth in the morning and at bedtime. Energizing iron tablets   PRESERVISION AREDS 2 PO Take 1 tablet by mouth 2 (two) times daily.   PROBIOTIC DAILY PO Take by mouth.   SYSTANE OP Apply 1 drop to eye 2 (two) times daily.   tamoxifen 20 MG tablet Commonly known as: NOLVADEX Take 1 tablet (20 mg total) by mouth daily.   vitamin B-12 500 MCG tablet Commonly known as: CYANOCOBALAMIN Take 500 mcg by mouth 2 (two) times a day.   Vitamin D 50 MCG (2000 UT) Caps Take 4,000 Units by mouth daily.      Allergies  Allergen Reactions  . Lyrica [Pregabalin] Swelling    Sleepy, does not eat  . Neurontin [Gabapentin] Nausea And Vomiting and Other (See Comments)    disoriented  . Bactrim [Sulfamethoxazole-Trimethoprim]     Made her dizziness    Follow-up Information  [name], MD. Schedule an appointment as soon as possible for a visit in 1 week(s).   Specialty: Internal Medicine Contact information: [address] [place] [address] [phone]                The results of significant diagnostics from this hospitalization (including imaging, microbiology, ancillary and laboratory) are listed below for reference.    Significant Diagnostic Studies: CT Head Wo Contrast  Result Date: 12/25/2020 CLINICAL DATA:  Nausea and vomiting with recent minor head trauma EXAM: CT HEAD WITHOUT CONTRAST TECHNIQUE: Contiguous axial images were obtained from the base of the skull through the vertex without intravenous contrast. COMPARISON:  07/22/2020 FINDINGS: Brain: Changes consistent with prior right MCA infarct are again noted and stable. Infarct in the right occipital lobe  is seen as well. No acute hemorrhage, acute infarction or space-occupying mass lesion is noted. Vascular: No hyperdense vessel or unexpected calcification. Skull: Normal. Negative for fracture or focal lesion. Sinuses/Orbits: No acute finding. Other: None. 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 No acute abnormality noted. Electronically Signed   By: Inez Catalina M.D.   On: 12/25/2020 16:08   CT ABDOMEN PELVIS W CONTRAST  Result Date: 12/25/2020 CLINICAL DATA:  Abdominal distension, vomiting. EXAM: CT ABDOMEN AND PELVIS WITH CONTRAST TECHNIQUE: Multidetector CT imaging of the abdomen and pelvis was performed using the standard protocol following bolus administration of intravenous contrast. CONTRAST:  80m OMNIPAQUE IOHEXOL 300 MG/ML  SOLN COMPARISON:  March 27, 2016. FINDINGS: Lower chest: Small right pleural effusion is noted with adjacent subsegmental atelectasis. Hepatobiliary: Mild gallbladder distention is noted without cholelithiasis. Minimal intrahepatic and extrahepatic biliary dilatation is noted without obstructing calculus. Liver is otherwise unremarkable. Pancreas: Mild amount of fluid is seen surrounding the pancreas concerning for acute pancreatitis. No ductal dilatation or pseudocyst formation is noted. Spleen: Normal in size without focal abnormality. Adrenals/Urinary Tract: Adrenal glands appear normal. Small nonobstructive calculus is noted in lower pole collecting system of left kidney. Small right renal cyst is noted. Mild right-sided hydroureteronephrosis is noted without obstructing calculus. Mild urinary bladder distention is noted. Stomach/Bowel: The stomach appears normal. There appears to be wall and fold thickening involving portions of the duodenum which may represent peptic ulcer disease or inflammation secondary to pancreatitis. Sigmoid diverticulosis is noted without inflammation. No definite evidence of bowel obstruction. Vascular/Lymphatic: Aortic  atherosclerosis. No enlarged abdominal or pelvic lymph nodes. Reproductive: Status post hysterectomy. No adnexal masses. Other: No definite hernia is noted. Small amount of free fluid is noted in the pelvis. Musculoskeletal: No acute or significant osseous findings. IMPRESSION: Mild amount of fluid is seen surround the pancreas concerning for acute pancreatitis. No definite pseudocyst formation is noted. There appears to be wall and fold thickening involving portions of the adjacent duodenum which may represent peptic ulcer disease or inflammation secondary to pancreatitis. Small right pleural effusion is noted with adjacent subsegmental atelectasis. Minimal intrahepatic and extrahepatic biliary dilatation is noted without obstructing calculus. Mild gallbladder distention is noted without cholelithiasis. Correlation with liver function tests is recommended to rule out obstruction. Mild right-sided hydroureteronephrosis is noted without obstructing calculus. Mild urinary bladder distention is noted. Aortic Atherosclerosis (ICD10-I70.0). Electronically Signed   By: JMarijo ConceptionM.D.   On: 12/25/2020 16:18   DG Bone Density  Result Date: 12/05/2020 EXAM: DUAL X-RAY ABSORPTIOMETRY (DXA) FOR BONE MINERAL DENSITY IMPRESSION: Your patient CDeirdre Grydercompleted a BMD test on 12/05/2020 using the LBeaver(software  version: 14.10) manufactured by [employer]. The following summarizes the results of our evaluation. Technologist: SCE PATIENT BIOGRAPHICAL: Name: Holly, Rose Patient ID: [id number] Birth Date: 1929-11-17 Height: 62.0 in. Gender: Female Exam Date: 12/05/2020 Weight: 122.7 lbs. Indications: Advanced Age, Caucasian, Diabetic, Height Loss, High Risk Meds, History of Breast Cancer, History of Radiation, Hysterectomy, Oophorectomy Bilateral, Postmenopausal, Vitamin D Deficiency Fractures: Treatments: Metformin, Multi-Vitamin with calcium, Vitamin D, Tamoxifen, Fosamax  DENSITOMETRY RESULTS: Site         Region     Measured Date Measured Age WHO Classification Young Adult T-score BMD         %Change vs. Previous Significant Change (*) AP Spine L1-L4 12/05/[age] 89.0 Osteopenia -1.2 1.042 g/cm2 -3.8% Yes AP Spine L1-L4 07/20/2019 87.6 Normal -0.9 1.083 g/cm2 3.7% Yes AP Spine L1-L4 07/18/2018 86.6 Osteopenia -1.2 1.044 g/cm2 -0.9% - AP Spine L1-L4 06/30/2017 85.6 Osteopenia -1.1 1.054 g/cm2 0.0% - AP Spine L1-L4 07/14/2016 84.6 Osteopenia -1.1 1.054 g/cm2 - - DualFemur Neck Left 12/05/2020 89.0 Osteopenia -2.3 0.715 g/cm2 -1.4% - DualFemur Neck Left 07/20/2019 87.6 Osteopenia -2.3 0.725 g/cm2 6.0% - DualFemur Neck Left 07/18/2018 86.6 Osteoporosis -2.5 0.684 g/cm2 -0.7% - DualFemur Neck Left 06/30/2017 85.6 Osteoporosis -2.5 0.689 g/cm2 -5.1% - DualFemur Neck Left 07/14/2016 84.6 Osteopenia -2.2 0.726 g/cm2 - - DualFemur Total Mean 12/05/2020 89.0 Osteopenia -2.1 0.743 g/cm2 -3.3% Yes DualFemur Total Mean 07/20/2019 87.6 Osteopenia -1.9 0.768 g/cm2 0.3% - DualFemur Total Mean 07/18/2018 86.6 Osteopenia -1.9 0.766 g/cm2 0.7% - DualFemur Total Mean 06/30/2017 85.6 Osteopenia -2.0 0.761 g/cm2 -3.9% Yes DualFemur Total Mean 07/14/2016 84.6 Osteopenia -1.7 0.792 g/cm2 - - Left Forearm Radius 33% 12/05/2020 89.0 Osteoporosis -3.4 0.578 g/cm2 -18.7% Yes Left Forearm Radius 33% 07/18/2018 86.6 Osteopenia -1.9 0.711 g/cm2 -3.4% - Left Forearm Radius 33% 06/30/2017 85.6 Osteopenia -1.6 0.736 g/cm2 - - ASSESSMENT: The BMD measured at Forearm Radius 33% is 0.578 g/cm2 with a T-score of -3.4. This patient is considered OSTEOPOROTIC according to [facility]) criteria. The scan quality is good. Compared with prior study, there has been a significant decrease in the spine. Compared with prior study, there has been a significant decrease in the total hip. World Pharmacologist [facility]) criteria for post-menopausal, Caucasian Women: Normal:                   T-score at or above -1  SD Osteopenia/low bone mass: T-score between -1 and -2.5 SD Osteoporosis:             T-score at or below -2.5 SD RECOMMENDATIONS: 1. All patients should optimize calcium and vitamin D intake. 2. Consider FDA-approved medical therapies in postmenopausal women and men aged 60 years and older, based on the following: a. A hip or vertebral(clinical or morphometric) fracture b. T-score < -2.5 at the femoral neck or spine after appropriate evaluation to exclude secondary causes c. Low bone mass (T-score between -1.0 and -2.5 at the femoral neck or spine) and a 10-year probability of a hip fracture > 3% or a 10-year probability of a major osteoporosis-related fracture > 20% based on the [place]-adapted WHO algorithm 3. Clinician judgment and/or patient preferences may indicate treatment for people with 10-year fracture probabilities above or below these levels FOLLOW-UP: People with diagnosed cases of osteoporosis or at high risk for fracture should have regular bone mineral density tests. For patients eligible for Medicare, routine testing is allowed once every 2 years. The testing frequency can be increased to one year for  patients who have rapidly progressing disease, those who are receiving or discontinuing medical therapy to restore bone mass, or have additional risk factors. I have reviewed this report, and agree with the above findings. Mark A. Thornton Papas, M.D. [facility] Electronically Signed   By: Lavonia Dana M.D.   On: 12/05/2020 16:47   US Abdomen Limited RUQ (LIVER/GB)  Result Date: 12/25/2020 CLINICAL DATA:  Pancreatitis EXAM: ULTRASOUND ABDOMEN LIMITED RIGHT UPPER QUADRANT COMPARISON:  CT 12/25/2020 FINDINGS: Gallbladder: Shadowing stones measuring up to 5 mm. Normal wall thickness. No sonographic Murphy. Common bile duct: Diameter: 3.4 mm Liver: No focal lesion identified. Within normal limits in parenchymal echogenicity. Portal vein is patent on color Doppler imaging with normal direction of  blood flow towards the liver. Other: Small right pleural effusion. Right kidney may be slightly echogenic. IMPRESSION: 1. Gallstones without evidence for acute cholecystitis. 2. Small right pleural effusion 3. Right kidney may be slightly echogenic, correlate with appropriate laboratory values. Electronically Signed   By: Donavan Foil M.D.   On: 12/25/2020 19:40    Microbiology: Recent Results (from the past 240 hour(s))  SARS CORONAVIRUS 2 (TAT 6-24 HRS) Nasopharyngeal Nasopharyngeal Swab     Status: None   Collection Time: 12/25/20  7:57 PM   Specimen: Nasopharyngeal Swab  Result Value Ref Range Status   SARS Coronavirus 2 NEGATIVE NEGATIVE Final    Comment: (NOTE) SARS-CoV-2 target nucleic acids are NOT DETECTED.  The SARS-CoV-2 RNA is generally detectable in upper and lower respiratory specimens during the acute phase of infection. Negative results do not preclude SARS-CoV-2 infection, do not rule out co-infections with other pathogens, and should not be used as the sole basis for treatment or other patient management decisions. Negative results must be combined with clinical observations, patient history, and epidemiological information. The expected result is Negative.  Fact Sheet for Patients: SugarRoll.be  Fact Sheet for Healthcare Providers: https://www.woods-mathews.com/  This test is not yet approved or cleared by the [place] FDA and  has been authorized for detection and/or diagnosis of SARS-CoV-2 by FDA under an Emergency Use Authorization (EUA). This EUA will remain  in effect (meaning this test can be used) for the duration of the COVID-19 declaration under Se ction 564(b)(1) of the Act, 21 U.S.C. section 360bbb-3(b)(1), unless the authorization is terminated or revoked sooner.  Performed at [facility], [place] [address], [place], [place] [address]   MRSA PCR Screening     Status: None   Collection Time:  12/26/20  1:24 AM   Specimen: Nasopharyngeal  Result Value Ref Range Status   MRSA by PCR NEGATIVE NEGATIVE Final    Comment:        The GeneXpert MRSA Assay (FDA approved for NASAL specimens only), is one component of a comprehensive MRSA colonization surveillance program. It is not intended to diagnose MRSA infection nor to guide or monitor treatment for MRSA infections. Performed at [facility], [address] [place], [place] [address]      Labs: Basic Metabolic Panel: Recent Labs  Lab 12/25/20 1429 12/25/20 1535 12/26/20 0534 12/27/20 0650 12/28/20 0400  NA 137  --  139 139 138  K 3.8  --  3.5 3.7 4.2  CL 104  --  107 109 108  CO2 26  --  _0 GLUCOSE 121*  --  75 77 102*  BUN 22  --  _1 CREATININE 0.81 0.80 0.62 0.50 0.52  CALCIUM 8.9  --  8.5* 8.3* 8.4*  MG 1.6*  --   --   --   --    Liver Function Tests: Recent Labs  Lab 12/25/20 1429 12/26/20 0534 12/27/20 0650 12/28/20 0400  AST 369* 202* 93* 67*  ALT 221* 165* 102* 83*  ALKPHOS 75 75 64 68  BILITOT 1.1 0.7 0.7 0.8  PROT 6.8 6.6 5.9* 5.8*  ALBUMIN 3.0* 2.9* 2.4* 2.4*   Recent Labs  Lab 12/25/20 1429 12/26/20 0534  LIPASE 1,926* 544*   No results for input(s): AMMONIA in the last 168 hours. CBC: Recent Labs  Lab 12/25/20 1429 12/26/20 0534 12/27/20 0650 12/28/20 0400  WBC 8.1 6.5 5.6 6.2  HGB 10.1* 10.1* 9.7* 10.3*  HCT 31.4* 31.2* 30.1* 32.1*  MCV 92.9 92.6 92.3 93.3  PLT 132* 127* 115* 121*   Cardiac Enzymes: No results for input(s): CKTOTAL, CKMB, CKMBINDEX, TROPONINI in the last 168 hours. BNP: BNP (last 3 results) No results for input(s): BNP in the last 8760 hours.  ProBNP (last 3 results) No results for input(s): PROBNP in the last 8760 hours.  CBG: Recent Labs  Lab 12/26/20 1750 12/27/20 0833 12/27/20 1142 12/27/20 1621 12/28/20 0811  GLUCAP 112* 87 81 118* 106*       Signed:  Domenic Polite MD.  [facility] 12/28/2020,  3:51 PM

## 2020-12-28 NOTE — Progress Notes (Signed)
Discharge packet reviewed and  given to Cheryll Dessert and [name] at bedside. All questions addressed.

## 2020-12-30 DIAGNOSIS — H353221 Exudative age-related macular degeneration, left eye, with active choroidal neovascularization: Secondary | ICD-10-CM | POA: Diagnosis not present

## 2020-12-31 ENCOUNTER — Telehealth: Payer: Self-pay

## 2020-12-31 NOTE — Telephone Encounter (Signed)
Transition Care Management Follow-up Telephone Call No TCM scheduled at this time.   Date of discharge and from where:  12/28/20 from [facility]  Attempts:  1st Attempt  Reason for unsuccessful TCM hospital follow-up in office:   Information received from daughter in law, HIPAA compliant.  Patient is back to baseline with no new symptoms presenting. Doing well. Prefers to keep previously scheduled appointment on 5/17 with PCP instead of scheduling HFU today.  Scheduled to see Dr. Nicolasa Ducking in 2 weeks. Nurse encourages patient to call PCP as needed.

## 2021-01-08 DIAGNOSIS — F29 Unspecified psychosis not due to a substance or known physiological condition: Secondary | ICD-10-CM | POA: Diagnosis not present

## 2021-01-08 DIAGNOSIS — F39 Unspecified mood [affective] disorder: Secondary | ICD-10-CM | POA: Diagnosis not present

## 2021-01-08 DIAGNOSIS — F5105 Insomnia due to other mental disorder: Secondary | ICD-10-CM | POA: Diagnosis not present

## 2021-01-15 ENCOUNTER — Other Ambulatory Visit: Payer: Self-pay | Admitting: Oncology

## 2021-01-16 ENCOUNTER — Other Ambulatory Visit: Payer: Self-pay | Admitting: Internal Medicine

## 2021-01-17 NOTE — Progress Notes (Signed)
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  INTERVAL HISTORY: Patient returns to clinic today for routine 54-monthevaluation.  She continues to feel well and remains asymptomatic.  She is tolerating tamoxifen and Fosamax well without significant side effects.  She has no neurologic complaints. She denies any recent fevers or illnesses. She has a good appetite and denies weight loss.  She denies any chest pain, shortness of breath, cough, or hemoptysis.  She denies any nausea, vomiting, constipation, or diarrhea. She has no urinary complaints.  Patient offers no specific complaints today.  REVIEW OF SYSTEMS:   Review of Systems  Constitutional: Negative.  Negative for fever, malaise/fatigue and weight loss.  Respiratory: Negative.  Negative for cough and shortness of breath.   Cardiovascular: Negative.  Negative for chest pain and leg swelling.  Gastrointestinal: Negative.  Negative for abdominal pain, blood in stool and melena.  Genitourinary: Negative.  Negative for dysuria.  Musculoskeletal: Negative.  Negative for back pain.  Skin: Negative.  Negative for rash.  Neurological: Negative.  Negative for sensory change, focal weakness, weakness and headaches.  Psychiatric/Behavioral: Negative.  The patient is not nervous/anxious.     As per HPI. Otherwise, a complete review of systems is negative.  PAST MEDICAL HISTORY: Past Medical History:  Diagnosis Date  . Allergy   . Anemia   . Arthritis   . [place]rial [place] ([place]  .  Breast cancer, left ([place] 10[place]   Lumpectomy and rad tx's.   .Marland KitchenCAD (coronary artery disease)   . Complication of anesthesia    hard to wake up with general anesthesia  . Diabetes ([place]   . Dysrhythmia    A-fib  . [place]  . Heart murmur   . History of kidney stones   . History of sick sinus syndrome    s/p pacemaker placement  . Hyperlipidemia   . Macular degeneration   . Neuropathy   . Neuropathy   . Pacemaker 06/2010  . Personal history of radiation therapy 11/2017   LEFT lumpectomy   . Squamous cell skin cancer   . TIA (transient ischemic attack)     PAST SURGICAL HISTORY: Past Surgical History:  Procedure Laterality Date  . ABDOMINAL HYSTERECTOMY  1960's  . [place]  1947  [place] BRE[place] BI[place]SY Left 12/17/2016   SINGLE FRAGMENT OF ATYPICAL EPITHELIAL CELLS  . BREAST BIOPSY Left 04/08/2017   [facility]  . BREAST EXCISIONAL BIOPSY Left 01/18/1986   Benign microcalcifications, [employer].  .Marland KitchenBREAST LUMPECTOMY Left 05/26/2017   13 mm,T1c, N0; ER/ PR+, Her 2 neu negative, HIGH RISK by Mammoprint ;  Surgeon: BRobert Bellow MD;  Location: [facility] ORS;  Service: General;  Laterality: Left;  . BREAST SURGERY  1988  . CARDIAC CATHETERIZATION  1989  . CATARACT EXTRACTION  1997 and 1998  . COLONOSCOPY WITH PROPOFOL N/A 09/12/2015   Procedure: COLONOSCOPY WITH PROPOFOL;  Surgeon: RManya Silvas MD;  [facility];  Service: Endoscopy;  Laterality: N/A;  . IMPLANTABLE CARDIOVERTER DEFIBRILLATOR (ICD) GENERATOR CHANGE  Left 03/29/2019   Procedure: PACEMAKER CHANGE OUT;  Surgeon: [name], MD;  Location: [facility] ORS;  Service: Cardiovascular;  Laterality: Left;  . KIDNEY STONE SURGERY  2018  . MOHS SURGERY  2019   forehead  . pace maker  2008  . [place]  . TONSILLECTOMY AND ADENOIDECTOMY  1950"s    FAMILY HISTORY: Family History  Problem Relation Age of Onset  . Stroke Mother   .  Diabetes Mother   . Cancer Maternal Aunt        Breast Cancer  . Breast cancer Maternal Aunt   . Arthritis Maternal Grandmother   . Cancer Maternal Aunt        Lung Cancer  . Diabetes Son   . Cancer Son   . Kidney disease Neg Hx   . Bladder Cancer Neg Hx     ADVANCED DIRECTIVES (Y/N):  N  HEALTH MAINTENANCE: Social History   Tobacco Use  . Smoking status: Former Smoker    Years: 5.00    Quit date: 09/14/1968    Years since quitting: 52.3  . Smokeless tobacco: Never Used  . Tobacco comment: quit 1970  Vaping Use  . Vaping Use: Never used  Substance Use Topics  . Alcohol use: No    Alcohol/week: 0.0 standard drinks  . Drug use: No     Colonoscopy:  PAP:  Bone density:  Lipid panel:  Allergies  Allergen Reactions  . Lyrica [Pregabalin] Swelling    Sleepy, does not eat  . Neurontin [Gabapentin] Nausea And Vomiting and Other (See Comments)    disoriented  . Bactrim [Sulfamethoxazole-Trimethoprim]     Made her dizziness    Current Outpatient Medications  Medication Sig Dispense Refill  . acetaminophen (TYLENOL) 325 MG tablet Take 2 tablets (650 mg total) by mouth every 6 (six) hours as needed for mild pain (or Fever >/= 101).    . Aflibercept 2 MG/0.05ML SOLN 1 each by Intravitreal route as directed.     Marland Kitchen alendronate (FOSAMAX) 70 MG tablet Take 1 tablet (70 mg total) by mouth once a week. Take with a full glass of water on an empty stomach. 12 tablet 3  . apixaban (ELIQUIS) 2.5 MG TABS tablet Take 1 tablet (2.5 mg total) by mouth 2 (two) times daily. 60 tablet 1  . atorvastatin (LIPITOR) 40 MG tablet Take 1 tablet (40 mg total) by mouth daily. 30 tablet 0  . Biotin w/ Vitamins C & E (HAIR/SKIN/NAILS PO) Take 3 tablets by mouth daily. 2 in the am, 1 in the pm    . blood glucose meter kit and supplies Per patient please dispense One touch Ultra2 meter.Use meter and supplies three times a day to check blood sugar. ICD10: E11.40 1 each 0  . Blood Glucose Monitoring Suppl  (FREESTYLE LITE) DEVI 2 (two) times daily.    . Cholecalciferol (VITAMIN D) 50 MCG (2000 UT) CAPS Take 4,000 Units by mouth daily.    . divalproex (DEPAKOTE) 125 MG DR tablet Take 1 tablet (125 mg total) by mouth at bedtime.    . furosemide (LASIX) 20 MG tablet Take 1 tablet (20 mg total) by mouth daily.    Marland Kitchen glucose blood (FREESTYLE LITE) test strip Used to check blood sugars twice a day. DX E11.9 100 each 12  . isosorbide mononitrate (IMDUR) 30 MG 24 hr tablet Take 1 tablet (30 mg total) by mouth daily. 30 tablet 0  . Lancets (FREESTYLE) lancets 2 (two) times daily.    Marland Kitchen  Magnesium 200 MG TABS Take 200 mg by mouth 2 (two) times a day.    . meclizine (ANTIVERT) 12.5 MG tablet Take 1 tablet (12.5 mg total) by mouth 2 (two) times daily as needed for dizziness. 14 tablet 0  . metFORMIN (GLUCOPHAGE-XR) 500 MG 24 hr tablet Take 1 tablet by mouth twice daily 90 tablet 0  . metoprolol tartrate (LOPRESSOR) 25 MG tablet Take 25 mg by mouth 2 (two) times daily.    . mirtazapine (REMERON) 7.5 MG tablet TAKE 1 TABLET BY MOUTH AT BEDTIME 90 tablet 0  . Multiple Vitamins-Minerals (PRESERVISION AREDS 2 PO) Take 1 tablet by mouth 2 (two) times daily.     . mupirocin ointment (BACTROBAN) 2 % Apply 1 application topically daily. 30 g 1  . OVER THE COUNTER MEDICATION Take 2 tablets by mouth in the morning and at bedtime. Energizing iron tablets    . Polyethyl Glycol-Propyl Glycol (SYSTANE OP) Apply 1 drop to eye 2 (two) times daily.    . Probiotic Product (PROBIOTIC DAILY PO) Take by mouth.    . tamoxifen (NOLVADEX) 20 MG tablet Take 1 tablet (20 mg total) by mouth daily. 90 tablet 3  . vitamin B-12 (CYANOCOBALAMIN) 500 MCG tablet Take 500 mcg by mouth 2 (two) times a day.    . nitroGLYCERIN (NITROSTAT) 0.4 MG SL tablet Place 0.4 mg under the tongue every 5 (five) minutes as needed for chest pain.  (Patient not taking: No sig reported)     No current facility-administered medications for this visit.     OBJECTIVE: Vitals:   01/21/21 1328  BP: (!) 96/54  Pulse: 70  Resp: 18  Temp: (!) 96.9 F (36.1 C)  SpO2: 100%     Body mass index is 21.01 kg/m.    ECOG FS:0 - Asymptomatic  General: Thin, no acute distress. Sitting in a wheelchair. Eyes: Pink conjunctiva, anicteric sclera. HEENT: Normocephalic, moist mucous membranes. Lungs: No audible wheezing or coughing. Heart: Regular rate and rhythm. Abdomen: Soft, nontender, no obvious distention. Musculoskeletal: No edema, cyanosis, or clubbing. Neuro: Alert, answering all questions appropriately. Cranial nerves grossly intact. Skin: No rashes or petechiae noted. Psych: Normal affect.    LAB RESULTS:  Lab Results  Component Value Date   NA 138 12/28/2020   K 4.2 12/28/2020   CL 108 12/28/2020   CO2 24 12/28/2020   GLUCOSE 102 (H) 12/28/2020   BUN 11 12/28/2020   CREATININE 0.52 12/28/2020   CALCIUM 8.4 (L) 12/28/2020   PROT 5.8 (L) 12/28/2020   ALBUMIN 2.4 (L) 12/28/2020   AST 67 (H) 12/28/2020   ALT 83 (H) 12/28/2020   ALKPHOS 68 12/28/2020   BILITOT 0.8 12/28/2020   GFRNONAA >60 12/28/2020   GFRAA >60 04/17/2020    Lab Results  Component Value Date   WBC 4.5 01/20/2021   NEUTROABS 3.1 01/20/2021   HGB 10.0 (L) 01/20/2021   HCT 31.8 (L) 01/20/2021   MCV 93.5 01/20/2021   PLT 144 (L) 01/20/2021   Lab Results  Component Value Date   IRON 40 01/20/2021   TIBC 342 01/20/2021   IRONPCTSAT 12 01/20/2021   Lab Results  Component Value Date   FERRITIN 16 01/20/2021     STUDIES: CT Head Wo Contrast  Result Date: 12/25/2020 CLINICAL DATA:  Nausea and vomiting with recent minor head trauma EXAM: CT HEAD WITHOUT CONTRAST TECHNIQUE: Contiguous axial images were obtained from the base of the skull through the vertex without intravenous contrast. COMPARISON:  07/22/2020 FINDINGS:  Brain: Changes consistent with prior right MCA infarct are again noted and stable. Infarct in the right occipital lobe is seen as well.  No acute hemorrhage, acute infarction or space-occupying mass lesion is noted. Vascular: No hyperdense vessel or unexpected calcification. Skull: Normal. Negative for fracture or focal lesion. Sinuses/Orbits: No acute finding. Other: None. 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 No acute abnormality noted. Electronically Signed   By: Inez Catalina M.D.   On: 12/25/2020 16:08   CT ABDOMEN PELVIS W CONTRAST  Result Date: 12/25/2020 CLINICAL DATA:  Abdominal distension, vomiting. EXAM: CT ABDOMEN AND PELVIS WITH CONTRAST TECHNIQUE: Multidetector CT imaging of the abdomen and pelvis was performed using the standard protocol following bolus administration of intravenous contrast. CONTRAST:  67m OMNIPAQUE IOHEXOL 300 MG/ML  SOLN COMPARISON:  March 27, 2016. FINDINGS: Lower chest: Small right pleural effusion is noted with adjacent subsegmental atelectasis. Hepatobiliary: Mild gallbladder distention is noted without cholelithiasis. Minimal intrahepatic and extrahepatic biliary dilatation is noted without obstructing calculus. Liver is otherwise unremarkable. Pancreas: Mild amount of fluid is seen surrounding the pancreas concerning for acute pancreatitis. No ductal dilatation or pseudocyst formation is noted. Spleen: Normal in size without focal abnormality. Adrenals/Urinary Tract: Adrenal glands appear normal. Small nonobstructive calculus is noted in lower pole collecting system of left kidney. Small right renal cyst is noted. Mild right-sided hydroureteronephrosis is noted without obstructing calculus. Mild urinary bladder distention is noted. Stomach/Bowel: The stomach appears normal. There appears to be wall and fold thickening involving portions of the duodenum which may represent peptic ulcer disease or inflammation secondary to pancreatitis. Sigmoid diverticulosis is noted without inflammation. No definite evidence of bowel obstruction. Vascular/Lymphatic: Aortic atherosclerosis. No  enlarged abdominal or pelvic lymph nodes. Reproductive: Status post hysterectomy. No adnexal masses. Other: No definite hernia is noted. Small amount of free fluid is noted in the pelvis. Musculoskeletal: No acute or significant osseous findings. IMPRESSION: Mild amount of fluid is seen surround the pancreas concerning for acute pancreatitis. No definite pseudocyst formation is noted. There appears to be wall and fold thickening involving portions of the adjacent duodenum which may represent peptic ulcer disease or inflammation secondary to pancreatitis. Small right pleural effusion is noted with adjacent subsegmental atelectasis. Minimal intrahepatic and extrahepatic biliary dilatation is noted without obstructing calculus. Mild gallbladder distention is noted without cholelithiasis. Correlation with liver function tests is recommended to rule out obstruction. Mild right-sided hydroureteronephrosis is noted without obstructing calculus. Mild urinary bladder distention is noted. Aortic Atherosclerosis (ICD10-I70.0). Electronically Signed   By: JMarijo ConceptionM.D.   On: 12/25/2020 16:18   [place]domen Limited RUQ (LIVER/GB)  Result Date: 12/25/2020 CLINICAL DATA:  Pancreatitis EXAM: ULTRASOUND ABDOMEN LIMITED RIGHT UPPER QUADRANT COMPARISON:  CT 12/25/2020 FINDINGS: Gallbladder: Shadowing stones measuring up to 5 mm. Normal wall thickness. No sonographic Murphy. Common bile duct: Diameter: 3.4 mm Liver: No focal lesion identified. Within normal limits in parenchymal echogenicity. Portal vein is patent on color Doppler imaging with normal direction of blood flow towards the liver. Other: Small right pleural effusion. Right kidney may be slightly echogenic. IMPRESSION: 1. Gallstones without evidence for acute cholecystitis. 2. Small right pleural effusion 3. Right kidney may be slightly echogenic, correlate with appropriate laboratory values. Electronically Signed   By: KDonavan FoilM.D.   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  PLAN:    1.  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  She completed adjuvant XRT. Given patient's osteoporosis, she was placed on tamoxifen which she will continue for a minimum of 5 years completing in 2024.  Given her high risk MammaPrint, could possibly consider extending treatment for 7 to 10 years.  Patient's most recent mammogram on March 01, 2020 was reported as BI-RADS 2. Repeat mammogram in June 2022.  Return to clinic in 6 months for routine evaluation.   2.  Osteoporosis: Patient's most recent bone mineral density on December 05, 2020 was significantly worse with a T score of -3.4.  Discontinue Fosamax and patient will return to clinic next week to initiate Prolia every 6 months.  Patient will have laboratory work at her primary care.  Continue calcium and vitamin D supplementation.  Repeat bone mineral density in March 2023.  Return to clinic in 6 months as above with laboratory work and continuation of Prolia. 3.  Anemia: Chronic and unchanged.  All of her laboratory work is either negative or within normal limits including iron stores.  Monitor.  Patient expressed understanding and was in agreement with this plan. She also understands that She can call clinic at any time with any questions, concerns, or complaints.   Cancer Staging Malignant neoplasm of left female breast [facility]) Staging form: Breast, AJCC 8th Edition - Pathologic stage from 06/27/2017: Stage IA (pT1c, pN0, cM0, G1, ER: Positive, PR: Positive, HER2: Negative) - Signed by [name], MD on 06/27/2017 Neoadjuvant therapy:  No Multigene prognostic tests performed: MammaPrint Histologic grading system: 3 grade system Laterality: Left   [name], MD   01/22/2021 6:53 AM

## 2021-01-20 ENCOUNTER — Other Ambulatory Visit: Payer: Self-pay

## 2021-01-20 ENCOUNTER — Inpatient Hospital Stay: Payer: Medicare Other | Attending: Oncology

## 2021-01-20 DIAGNOSIS — I4891 Unspecified atrial fibrillation: Secondary | ICD-10-CM | POA: Insufficient documentation

## 2021-01-20 DIAGNOSIS — I2510 Atherosclerotic heart disease of native coronary artery without angina pectoris: Secondary | ICD-10-CM | POA: Insufficient documentation

## 2021-01-20 DIAGNOSIS — Z803 Family history of malignant neoplasm of breast: Secondary | ICD-10-CM | POA: Insufficient documentation

## 2021-01-20 DIAGNOSIS — Z801 Family history of malignant neoplasm of trachea, bronchus and lung: Secondary | ICD-10-CM | POA: Insufficient documentation

## 2021-01-20 DIAGNOSIS — D509 Iron deficiency anemia, unspecified: Secondary | ICD-10-CM

## 2021-01-20 DIAGNOSIS — Z87891 Personal history of nicotine dependence: Secondary | ICD-10-CM | POA: Insufficient documentation

## 2021-01-20 DIAGNOSIS — D649 Anemia, unspecified: Secondary | ICD-10-CM

## 2021-01-20 DIAGNOSIS — Z79810 Long term (current) use of selective estrogen receptor modulators (SERMs): Secondary | ICD-10-CM | POA: Diagnosis not present

## 2021-01-20 DIAGNOSIS — E119 Type 2 diabetes mellitus without complications: Secondary | ICD-10-CM | POA: Diagnosis not present

## 2021-01-20 DIAGNOSIS — Z79899 Other long term (current) drug therapy: Secondary | ICD-10-CM | POA: Insufficient documentation

## 2021-01-20 DIAGNOSIS — G629 Polyneuropathy, unspecified: Secondary | ICD-10-CM | POA: Insufficient documentation

## 2021-01-20 DIAGNOSIS — C50412 Malignant neoplasm of upper-outer quadrant of left female breast: Secondary | ICD-10-CM | POA: Diagnosis not present

## 2021-01-20 DIAGNOSIS — Z7901 Long term (current) use of anticoagulants: Secondary | ICD-10-CM | POA: Insufficient documentation

## 2021-01-20 DIAGNOSIS — R011 Cardiac murmur, unspecified: Secondary | ICD-10-CM | POA: Diagnosis not present

## 2021-01-20 DIAGNOSIS — M810 Age-related osteoporosis without current pathological fracture: Secondary | ICD-10-CM | POA: Diagnosis not present

## 2021-01-20 DIAGNOSIS — Z170 Estrogen receptor positive status [ER+]: Secondary | ICD-10-CM | POA: Diagnosis not present

## 2021-01-20 DIAGNOSIS — E785 Hyperlipidemia, unspecified: Secondary | ICD-10-CM | POA: Diagnosis not present

## 2021-01-20 DIAGNOSIS — I252 Old myocardial infarction: Secondary | ICD-10-CM | POA: Diagnosis not present

## 2021-01-20 LAB — CBC WITH DIFFERENTIAL/PLATELET
Abs Immature Granulocytes: 0.01 10*3/uL (ref 0.00–0.07)
Basophils Absolute: 0 10*3/uL (ref 0.0–0.1)
Basophils Relative: 0 %
Eosinophils Absolute: 0 10*3/uL (ref 0.0–0.5)
Eosinophils Relative: 0 %
HCT: 31.8 % — ABNORMAL LOW (ref 36.0–46.0)
Hemoglobin: 10 g/dL — ABNORMAL LOW (ref 12.0–15.0)
Immature Granulocytes: 0 %
Lymphocytes Relative: 23 %
Lymphs Abs: 1 10*3/uL (ref 0.7–4.0)
MCH: 29.4 pg (ref 26.0–34.0)
MCHC: 31.4 g/dL (ref 30.0–36.0)
MCV: 93.5 fL (ref 80.0–100.0)
Monocytes Absolute: 0.3 10*3/uL (ref 0.1–1.0)
Monocytes Relative: 7 %
Neutro Abs: 3.1 10*3/uL (ref 1.7–7.7)
Neutrophils Relative %: 70 %
Platelets: 144 10*3/uL — ABNORMAL LOW (ref 150–400)
RBC: 3.4 MIL/uL — ABNORMAL LOW (ref 3.87–5.11)
RDW: 16.4 % — ABNORMAL HIGH (ref 11.5–15.5)
WBC: 4.5 10*3/uL (ref 4.0–10.5)
nRBC: 0 % (ref 0.0–0.2)

## 2021-01-20 LAB — IRON AND TIBC
Iron: 40 ug/dL (ref 28–170)
Saturation Ratios: 12 % (ref 10.4–31.8)
TIBC: 342 ug/dL (ref 250–450)
UIBC: 302 ug/dL

## 2021-01-20 LAB — FERRITIN: Ferritin: 16 ng/mL (ref 11–307)

## 2021-01-20 LAB — PATHOLOGIST SMEAR REVIEW

## 2021-01-20 LAB — FOLATE: Folate: 31 ng/mL (ref >5.9)

## 2021-01-21 ENCOUNTER — Inpatient Hospital Stay (HOSPITAL_BASED_OUTPATIENT_CLINIC_OR_DEPARTMENT_OTHER): Payer: Medicare Other | Admitting: Oncology

## 2021-01-21 ENCOUNTER — Encounter: Payer: Self-pay | Admitting: Oncology

## 2021-01-21 ENCOUNTER — Inpatient Hospital Stay: Payer: Medicare Other

## 2021-01-21 ENCOUNTER — Other Ambulatory Visit: Payer: Self-pay

## 2021-01-21 VITALS — BP 96/54 | HR 70 | Temp 96.9°F | Resp 18 | Ht 63.0 in | Wt 118.6 lb

## 2021-01-21 DIAGNOSIS — C50412 Malignant neoplasm of upper-outer quadrant of left female breast: Secondary | ICD-10-CM | POA: Diagnosis not present

## 2021-01-21 DIAGNOSIS — Z79899 Other long term (current) drug therapy: Secondary | ICD-10-CM | POA: Diagnosis not present

## 2021-01-21 DIAGNOSIS — I25119 Atherosclerotic heart disease of native coronary artery with unspecified angina pectoris: Secondary | ICD-10-CM

## 2021-01-21 DIAGNOSIS — Z170 Estrogen receptor positive status [ER+]: Secondary | ICD-10-CM | POA: Diagnosis not present

## 2021-01-21 DIAGNOSIS — M810 Age-related osteoporosis without current pathological fracture: Secondary | ICD-10-CM | POA: Diagnosis not present

## 2021-01-21 DIAGNOSIS — Z79810 Long term (current) use of selective estrogen receptor modulators (SERMs): Secondary | ICD-10-CM | POA: Diagnosis not present

## 2021-01-21 DIAGNOSIS — Z1231 Encounter for screening mammogram for malignant neoplasm of breast: Secondary | ICD-10-CM

## 2021-01-21 DIAGNOSIS — D649 Anemia, unspecified: Secondary | ICD-10-CM | POA: Diagnosis not present

## 2021-01-22 DIAGNOSIS — M810 Age-related osteoporosis without current pathological fracture: Secondary | ICD-10-CM | POA: Insufficient documentation

## 2021-01-23 ENCOUNTER — Encounter: Payer: Self-pay | Admitting: Internal Medicine

## 2021-01-23 LAB — COPPER, SERUM: Copper: 124 ug/dL (ref 80–158)

## 2021-01-24 NOTE — Telephone Encounter (Signed)
Please call and see if needs anything prior to appt.

## 2021-01-24 NOTE — Telephone Encounter (Signed)
Confirmed nothing needed before appt.

## 2021-01-28 ENCOUNTER — Ambulatory Visit (INDEPENDENT_AMBULATORY_CARE_PROVIDER_SITE_OTHER): Payer: Medicare Other | Admitting: Internal Medicine

## 2021-01-28 ENCOUNTER — Other Ambulatory Visit: Payer: Self-pay

## 2021-01-28 VITALS — BP 102/60 | HR 83 | Temp 98.1°F | Resp 16 | Ht 63.0 in | Wt 123.0 lb

## 2021-01-28 DIAGNOSIS — E1165 Type 2 diabetes mellitus with hyperglycemia: Secondary | ICD-10-CM | POA: Diagnosis not present

## 2021-01-28 DIAGNOSIS — E559 Vitamin D deficiency, unspecified: Secondary | ICD-10-CM

## 2021-01-28 DIAGNOSIS — D509 Iron deficiency anemia, unspecified: Secondary | ICD-10-CM | POA: Diagnosis not present

## 2021-01-28 DIAGNOSIS — Z79899 Other long term (current) drug therapy: Secondary | ICD-10-CM

## 2021-01-28 DIAGNOSIS — C50912 Malignant neoplasm of unspecified site of left female breast: Secondary | ICD-10-CM

## 2021-01-28 DIAGNOSIS — F439 Reaction to severe stress, unspecified: Secondary | ICD-10-CM

## 2021-01-28 DIAGNOSIS — I480 Paroxysmal atrial fibrillation: Secondary | ICD-10-CM

## 2021-01-28 DIAGNOSIS — R441 Visual hallucinations: Secondary | ICD-10-CM | POA: Diagnosis not present

## 2021-01-28 DIAGNOSIS — E785 Hyperlipidemia, unspecified: Secondary | ICD-10-CM

## 2021-01-28 DIAGNOSIS — M810 Age-related osteoporosis without current pathological fracture: Secondary | ICD-10-CM | POA: Diagnosis not present

## 2021-01-28 DIAGNOSIS — E1140 Type 2 diabetes mellitus with diabetic neuropathy, unspecified: Secondary | ICD-10-CM

## 2021-01-28 DIAGNOSIS — I25119 Atherosclerotic heart disease of native coronary artery with unspecified angina pectoris: Secondary | ICD-10-CM

## 2021-01-28 NOTE — Progress Notes (Deleted)
Patient ID: Holly Rose, female   DOB: [birth year], 87 y.o.   MRN: [id number]   Subjective:    Patient ID: Holly Rose, female    DOB: 1930-04-24, 87 y.o.   MRN: [id number]  HPI This visit occurred during the SARS-CoV-2 public health emergency.  Safety protocols were in place, including screening questions prior to the visit, additional usage of staff PPE, and extensive cleaning of exam room while observing appropriate contact time as indicated for disinfecting solutions.  Patient here for   Past Medical History:  Diagnosis Date  . Allergy   . Anemia   . Arthritis   . Atrial fibrillation ([place]) 2004  . Breast cancer, left ([place]) 10/2017   Lumpectomy and rad tx's.   Marland Kitchen CAD (coronary artery disease)   . Complication of anesthesia    hard to wake up with general anesthesia  . Diabetes ([place])   . Dysrhythmia    A-fib  . Heart attack ([place]) 2008  . Heart murmur   . History of kidney stones   . History of sick sinus syndrome    s/p pacemaker placement  . Hyperlipidemia   . Macular degeneration   . Neuropathy   . Neuropathy   . Pacemaker 06/2010  . Personal history of radiation therapy 11/2017   LEFT lumpectomy   . Squamous cell skin cancer   . TIA (transient ischemic attack)    Past Surgical History:  Procedure Laterality Date  . ABDOMINAL HYSTERECTOMY  1960's  . APPENDECTOMY  1947  . [place]  . BREAST BIOPSY Left 12/17/2016   SINGLE FRAGMENT OF ATYPICAL EPITHELIAL CELLS  . BREAST BIOPSY Left 04/08/2017   [facility]  . BREAST EXCISIONAL BIOPSY Left 01/18/1986   Benign microcalcifications, [employer].  Marland Kitchen BREAST LUMPECTOMY Left 05/26/2017   13 mm,T1c, N0; ER/ PR+, Her 2 neu negative, HIGH RISK by [place] ;  Surgeon: [name], MD;  Location: [facility] ORS;  Service: General;  Laterality: Left;  . BREAST SURGERY  1988  . CARDIAC CATHETERIZATION  1989  . CATARACT EXTRACTION  1997 and 1998  . COLONOSCOPY WITH PROPOFOL N/A 09/12/2015    Procedure: COLONOSCOPY WITH PROPOFOL;  Surgeon: [name], MD;  Location[facility] END[facility]COPY;  Service: Endoscopy;  Laterality: N/A;  . IMPLANTABLE CARDIOVERTER DEFIBRILLATOR (ICD) GENERATOR CHANGE Left 03/29/2019   Procedure: PACEMAKER CHANGE OUT;  Surgeon: [name], MD;  Location: [facility] ORS;  Service: Cardiovascular;  Laterality: Left;  . KIDNEY STONE SURGERY  2018  . MOHS SURGERY  2019   forehead  . pace maker  2008  . [name]  . TONSILLECTOMY AND ADENOIDECTOMY  1950"s   Family History  Problem Relation Age of Onset  . Stroke Mother   . Diabetes Mother   . Cancer Maternal Aunt        Breast Cancer  . Breast cancer Maternal Aunt   . Arthritis Maternal Grandmother   . Cancer Maternal Aunt        Lung Cancer  . Diabetes Son   . Cancer Son   . Kidney disease Neg Hx   . Bladder Cancer Neg Hx    Social History   Socioeconomic History  . Marital status: Married    Spouse name: Holly Rose  . Number of children: 2  . Years of education: Not on file  . Highest education level: Not on file  Occupational History  . Not on file  Tobacco Use  .  Smoking status: Former Smoker    Years: 5.00    Quit date: 09/14/1968    Years since quitting: 52.4  . Smokeless tobacco: Never Used  . Tobacco comment: quit 1970  Vaping Use  . Vaping Use: Never used  Substance and Sexual Activity  . Alcohol use: No    Alcohol/week: 0.0 standard drinks  . Drug use: No  . Sexual activity: Not on file  Other Topics Concern  . Not on file  Social History Narrative   Live home with husband in [place]   Social Determinants of Health   Financial Resource Strain: Not on file  Food Insecurity: Not on file  Transportation Needs: Not on file  Physical Activity: Not on file  Stress: Not on file  Social Connections: Not on file     Review of Systems     Objective:    Physical Exam  BP 102/60   Pulse 83   Temp 98.1 F (36.7 C) (Oral)   Resp 16   Ht 5\' 3"   (1.6 m)   Wt 123 lb (55.8 kg)   LMP  (LMP Unknown)   SpO2 98%   BMI 21.79 kg/m  Wt Readings from Last 3 Encounters:  01/28/21 123 lb (55.8 kg)  01/21/21 118 lb 9.6 oz (53.8 kg)  12/28/20 122 lb 1.6 oz (55.4 kg)     Lab Results  Component Value Date   WBC 4.5 01/20/2021   HGB 10.0 (L) 01/20/2021   HCT 31.8 (L) 01/20/2021   PLT 144 (L) 01/20/2021   GLUCOSE 102 (H) 12/28/2020   CHOL 99 07/23/2020   TRIG 60 07/23/2020   HDL 41 07/23/2020   LDLCALC 46 07/23/2020   ALT 83 (H) 12/28/2020   AST 67 (H) 12/28/2020   NA 138 12/28/2020   K 4.2 12/28/2020   CL 108 12/28/2020   CREATININE 0.52 12/28/2020   BUN 11 12/28/2020   CO2 24 12/28/2020   TSH 1.860 04/15/2020   INR 1.4 (H) 12/26/2020   HGBA1C 6.6 (H) 10/30/2020   MICROALBUR 65.4 (H) 03/09/2016    CT Head Wo Contrast  Result Date: 12/25/2020 CLINICAL DATA:  Nausea and vomiting with recent minor head trauma EXAM: CT HEAD WITHOUT CONTRAST TECHNIQUE: Contiguous axial images were obtained from the base of the skull through the vertex without intravenous contrast. COMPARISON:  07/22/2020 FINDINGS: Brain: Changes consistent with prior right MCA infarct are again noted and stable. Infarct in the right occipital lobe is seen as well. No acute hemorrhage, acute infarction or space-occupying mass lesion is noted. Vascular: No hyperdense vessel or unexpected calcification. Skull: Normal. Negative for fracture or focal lesion. Sinuses/Orbits: No acute finding. Other: None. 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 No acute abnormality noted. Electronically Signed   By: Inez Catalina M.D.   On: 12/25/2020 16:08   CT ABDOMEN PELVIS W CONTRAST  Result Date: 12/25/2020 CLINICAL DATA:  Abdominal distension, vomiting. EXAM: CT ABDOMEN AND PELVIS WITH CONTRAST TECHNIQUE: Multidetector CT imaging of the abdomen and pelvis was performed using the standard protocol following bolus administration of intravenous contrast.  CONTRAST:  39mL OMNIPAQUE IOHEXOL 300 MG/ML  SOLN COMPARISON:  March 27, 2016. FINDINGS: Lower chest: Small right pleural effusion is noted with adjacent subsegmental atelectasis. Hepatobiliary: Mild gallbladder distention is noted without cholelithiasis. Minimal intrahepatic and extrahepatic biliary dilatation is noted without obstructing calculus. Liver is otherwise unremarkable. Pancreas: Mild amount of fluid is seen surrounding the pancreas concerning for acute pancreatitis.  No ductal dilatation or pseudocyst formation is noted. Spleen: Normal in size without focal abnormality. Adrenals/Urinary Tract: Adrenal glands appear normal. Small nonobstructive calculus is noted in lower pole collecting system of left kidney. Small right renal cyst is noted. Mild right-sided hydroureteronephrosis is noted without obstructing calculus. Mild urinary bladder distention is noted. Stomach/Bowel: The stomach appears normal. There appears to be wall and fold thickening involving portions of the duodenum which may represent peptic ulcer disease or inflammation secondary to pancreatitis. Sigmoid diverticulosis is noted without inflammation. No definite evidence of bowel obstruction. Vascular/Lymphatic: Aortic atherosclerosis. No enlarged abdominal or pelvic lymph nodes. Reproductive: Status post hysterectomy. No adnexal masses. Other: No definite hernia is noted. Small amount of free fluid is noted in the pelvis. Musculoskeletal: No acute or significant osseous findings. IMPRESSION: Mild amount of fluid is seen surround the pancreas concerning for acute pancreatitis. No definite pseudocyst formation is noted. There appears to be wall and fold thickening involving portions of the adjacent duodenum which may represent peptic ulcer disease or inflammation secondary to pancreatitis. Small right pleural effusion is noted with adjacent subsegmental atelectasis. Minimal intrahepatic and extrahepatic biliary dilatation is noted without  obstructing calculus. Mild gallbladder distention is noted without cholelithiasis. Correlation with liver function tests is recommended to rule out obstruction. Mild right-sided hydroureteronephrosis is noted without obstructing calculus. Mild urinary bladder distention is noted. Aortic Atherosclerosis (ICD10-I70.0). Electronically Signed   By: Marijo Conception M.D.   On: 12/25/2020 16:18   US Abdomen Limited RUQ (LIVER/GB)  Result Date: 12/25/2020 CLINICAL DATA:  Pancreatitis EXAM: ULTRASOUND ABDOMEN LIMITED RIGHT UPPER QUADRANT COMPARISON:  CT 12/25/2020 FINDINGS: Gallbladder: Shadowing stones measuring up to 5 mm. Normal wall thickness. No sonographic Murphy. Common bile duct: Diameter: 3.4 mm Liver: No focal lesion identified. Within normal limits in parenchymal echogenicity. Portal vein is patent on color Doppler imaging with normal direction of blood flow towards the liver. Other: Small right pleural effusion. Right kidney may be slightly echogenic. IMPRESSION: 1. Gallstones without evidence for acute cholecystitis. 2. Small right pleural effusion 3. Right kidney may be slightly echogenic, correlate with appropriate laboratory values. Electronically Signed   By: Donavan Foil M.D.   On: 12/25/2020 19:40       Assessment & Plan:   Problem List Items Addressed This Visit   None      [name], MD

## 2021-01-29 LAB — BASIC METABOLIC PANEL
BUN: 15 mg/dL (ref 6–23)
CO2: 30 mEq/L (ref 19–32)
Calcium: 9.8 mg/dL (ref 8.4–10.5)
Chloride: 102 mEq/L (ref 96–112)
Creatinine, Ser: 0.77 mg/dL (ref 0.40–1.20)
GFR: 68.5 mL/min (ref >60.00)
Glucose, Bld: 112 mg/dL — ABNORMAL HIGH (ref 70–99)
Potassium: 4.1 mEq/L (ref 3.5–5.1)
Sodium: 138 mEq/L (ref 135–145)

## 2021-01-29 LAB — URINALYSIS, ROUTINE W REFLEX MICROSCOPIC
Bilirubin Urine: NEGATIVE
Hgb urine dipstick: NEGATIVE
Ketones, ur: NEGATIVE
Nitrite: POSITIVE — AB
RBC / HPF: NONE SEEN (ref 0–?)
Specific Gravity, Urine: 1.02 (ref 1.000–1.030)
Total Protein, Urine: NEGATIVE
Urine Glucose: NEGATIVE
Urobilinogen, UA: 0.2 (ref 0.0–1.0)
pH: 5.5 (ref 5.0–8.0)

## 2021-01-29 LAB — HEPATIC FUNCTION PANEL
ALT: 16 U/L (ref 0–35)
AST: 21 U/L (ref 0–37)
Albumin: 3.3 g/dL — ABNORMAL LOW (ref 3.5–5.2)
Alkaline Phosphatase: 49 U/L (ref 39–117)
Bilirubin, Direct: 0.1 mg/dL (ref 0.0–0.3)
Total Bilirubin: 0.3 mg/dL (ref 0.2–1.2)
Total Protein: 7 g/dL (ref 6.0–8.3)

## 2021-01-29 LAB — TSH: TSH: 2.46 u[IU]/mL (ref 0.35–4.50)

## 2021-01-29 LAB — VALPROIC ACID LEVEL: Valproic Acid Lvl: 8 ug/mL — ABNORMAL LOW (ref 50–100)

## 2021-01-29 LAB — VITAMIN D 25 HYDROXY (VIT D DEFICIENCY, FRACTURES): VITD: 61.59 ng/mL (ref 30.00–100.00)

## 2021-02-02 ENCOUNTER — Encounter: Payer: Self-pay | Admitting: Internal Medicine

## 2021-02-02 NOTE — Assessment & Plan Note (Addendum)
Continues on metformin - given elevated blood sugars.  Follow met b and a1c.  

## 2021-02-02 NOTE — Assessment & Plan Note (Signed)
Follow cbc and iron studies.  

## 2021-02-02 NOTE — Assessment & Plan Note (Signed)
Planning to start prolia.  Check labs, including vitamin  D level , calcium and kidney function.

## 2021-02-02 NOTE — Assessment & Plan Note (Signed)
Continue isosorbide, metoprolol and lipitor.  Continue f/u with cardiology.

## 2021-02-02 NOTE — Assessment & Plan Note (Signed)
Has good support.  Follow.

## 2021-02-02 NOTE — Assessment & Plan Note (Signed)
Continue lipitor.  Follow lipid panel and liver function tests.   

## 2021-02-02 NOTE — Assessment & Plan Note (Signed)
On depakote and remeron.  Continue f/u with Dr Nicolasa Ducking.

## 2021-02-02 NOTE — Progress Notes (Signed)
Patient ID: Holly Rose, female   DOB: Feb 27, 1930, 87 y.o.   MRN: [id number]   Subjective:    Patient ID: Holly Rose, female    DOB: April 30, 1930, 87 y.o.   MRN: [id number]  HPI This visit occurred during the SARS-CoV-2 public health emergency.  Safety protocols were in place, including screening questions prior to the visit, additional usage of staff PPE, and extensive cleaning of exam room while observing appropriate contact time as indicated for disinfecting solutions.  Patient here for a scheduled follow up.  She is accompanied by her daughter-n-law.  History obtained from both of them.  Still seeing Dr Nicolasa Ducking for her anxiety.  Relatively stable.  Taking depakote and remeron.  Still needs valproic acid level drawn.  Seeing Dr Grayland Ormond.  Has been on fosamax.  Planning to change to prolia.  Needs labs.  She denies any chest pain.  Breathing is stable.  Eating.  No nausea or vomiting.  No abdominal pain.  Bowels moving.  Has paroxysmal afib.  On eliqus and metoprolol.  Appears to be doing well on these medications.  Raised skin lesion - scalp.  Discussed dermatology referral.   Past Medical History:  Diagnosis Date  . Allergy   . Anemia   . Arthritis   . Atrial fibrillation ([place]) 2004  . Breast cancer, left ([place]) 10/2017   Lumpectomy and rad tx's.   Marland Kitchen CAD (coronary artery disease)   . Complication of anesthesia    hard to wake up with general anesthesia  . Diabetes ([place])   . Dysrhythmia    A-fib  . Heart attack ([place]) 2008  . Heart murmur   . History of kidney stones   . History of sick sinus syndrome    s/p pacemaker placement  . Hyperlipidemia   . Macular degeneration   . Neuropathy   . Neuropathy   . Pacemaker 06/2010  . Personal history of radiation therapy 11/2017   LEFT lumpectomy   . Squamous cell skin cancer   . TIA (transient ischemic attack)    Past Surgical History:  Procedure Laterality Date  . ABDOMINAL HYSTERECTOMY  1960's  . APPENDECTOMY  1947  .  [place]  . BREAST BIOPSY Left 12/17/2016   SINGLE FRAGMENT OF ATYPICAL EPITHELIAL CELLS  . BREAST BIOPSY Left 04/08/2017   [facility]  . BREAST EXCISIONAL BIOPSY Left 01/18/1986   Benign microcalcifications, [employer].  Marland Kitchen BREAST LUMPECTOMY Left 05/26/2017   13 mm,T1c, N0; ER/ PR+, Her 2 neu negative, HIGH RISK by [place] ;  Surgeon: [name], MD;  Location: [facility] ORS;  Service: General;  Laterality: Left;  . BREAST SURGERY  1988  . CARDIAC CATHETERIZATION  1989  . CATARACT EXTRACTION  1997 and 1998  . COLONOSCOPY WITH PROPOFOL N/A 09/12/2015   Procedure: COLONOSCOPY WITH PROPOFOL;  Surgeon: [name], MD;  Location: [facility] ENDOSCOPY;  Service: Endoscopy;  Laterality: N/A;  . IMPLANTABLE CARDIOVERTER DEFIBRILLATOR (ICD) GENERATOR CHANGE Left 03/29/2019   Procedure: PACEMAKER CHANGE OUT;  Surgeon: [name], MD;  Location: [facility] ORS;  Service: Cardiovascular;  Laterality: Left;  . KIDNEY STONE SURGERY  2018  . MOHS SURGERY  2019   forehead  . pace maker  2008  . [place]  . TONSILLECTOMY AND ADENOIDECTOMY  1950"s   Family History  Problem Relation Age of Onset  . Stroke Mother   . Diabetes Mother   . Cancer Maternal Aunt  Breast Cancer  . Breast cancer Maternal Aunt   . Arthritis Maternal Grandmother   . Cancer Maternal Aunt        Lung Cancer  . Diabetes Son   . Cancer Son   . Kidney disease Neg Hx   . Bladder Cancer Neg Hx    Social History   Socioeconomic History  . Marital status: Married    Spouse name: Holly Rose  . Number of children: 2  . Years of education: Not on file  . Highest education level: Not on file  Occupational History  . Not on file  Tobacco Use  . Smoking status: Former Smoker    Years: 5.00    Quit date: 09/14/1968    Years since quitting: 52.4  . Smokeless tobacco: Never Used  . Tobacco comment: quit 1970  Vaping Use  . Vaping Use: Never used  Substance and Sexual Activity  .  Alcohol use: No    Alcohol/week: 0.0 standard drinks  . Drug use: No  . Sexual activity: Not on file  Other Topics Concern  . Not on file  Social History Narrative   Live home with husband in [place]   Social Determinants of Health   Financial Resource Strain: Not on file  Food Insecurity: Not on file  Transportation Needs: Not on file  Physical Activity: Not on file  Stress: Not on file  Social Connections: Not on file    Outpatient Encounter Medications as of 01/28/2021  Medication Sig  . acetaminophen (TYLENOL) 325 MG tablet Take 2 tablets (650 mg total) by mouth every 6 (six) hours as needed for mild pain (or Fever >/= 101).  . Aflibercept 2 MG/0.05ML SOLN 1 each by Intravitreal route as directed.   Marland Kitchen apixaban (ELIQUIS) 2.5 MG TABS tablet Take 1 tablet (2.5 mg total) by mouth 2 (two) times daily.  Marland Kitchen atorvastatin (LIPITOR) 40 MG tablet Take 1 tablet (40 mg total) by mouth daily.  . Biotin w/ Vitamins C & E (HAIR/SKIN/NAILS PO) Take 3 tablets by mouth daily. 2 in the am, 1 in the pm  . blood glucose meter kit and supplies Per patient please dispense One touch Ultra2 meter.Use meter and supplies three times a day to check blood sugar. ICD10: E11.40  . Blood Glucose Monitoring Suppl (FREESTYLE LITE) DEVI 2 (two) times daily.  . Cholecalciferol (VITAMIN D) 50 MCG (2000 UT) CAPS Take 4,000 Units by mouth daily.  . divalproex (DEPAKOTE) 125 MG DR tablet Take 1 tablet (125 mg total) by mouth at bedtime.  . furosemide (LASIX) 20 MG tablet Take 1 tablet (20 mg total) by mouth daily.  Marland Kitchen glucose blood (FREESTYLE LITE) test strip Used to check blood sugars twice a day. DX E11.9  . isosorbide mononitrate (IMDUR) 30 MG 24 hr tablet Take 1 tablet (30 mg total) by mouth daily.  . Lancets (FREESTYLE) lancets 2 (two) times daily.  . Magnesium 200 MG TABS Take 200 mg by mouth 2 (two) times a day.  . meclizine (ANTIVERT) 12.5 MG tablet Take 1 tablet (12.5 mg total) by mouth 2 (two)  times daily as needed for dizziness.  . metFORMIN (GLUCOPHAGE-XR) 500 MG 24 hr tablet Take 1 tablet by mouth twice daily  . metoprolol tartrate (LOPRESSOR) 25 MG tablet Take 25 mg by mouth 2 (two) times daily.  . mirtazapine (REMERON) 15 MG tablet Take 15 mg by mouth at bedtime.  . Multiple Vitamins-Minerals (PRESERVISION AREDS 2 PO) Take 1 tablet by mouth 2 (two)  times daily.   . mupirocin ointment (BACTROBAN) 2 % Apply 1 application topically daily.  Marland Kitchen OVER THE COUNTER MEDICATION Take 2 tablets by mouth in the morning and at bedtime. Energizing iron tablets  . Polyethyl Glycol-Propyl Glycol (SYSTANE OP) Apply 1 drop to eye 2 (two) times daily.  . Probiotic Product (PROBIOTIC DAILY PO) Take by mouth.  . tamoxifen (NOLVADEX) 20 MG tablet Take 1 tablet (20 mg total) by mouth daily.  . vitamin B-12 (CYANOCOBALAMIN) 500 MCG tablet Take 500 mcg by mouth 2 (two) times a day.  . [DISCONTINUED] alendronate (FOSAMAX) 70 MG tablet Take 1 tablet (70 mg total) by mouth once a week. Take with a full glass of water on an empty stomach. (Patient not taking: Reported on 01/28/2021)  . [DISCONTINUED] mirtazapine (REMERON) 7.5 MG tablet TAKE 1 TABLET BY MOUTH AT BEDTIME  . [DISCONTINUED] nitroGLYCERIN (NITROSTAT) 0.4 MG SL tablet Place 0.4 mg under the tongue every 5 (five) minutes as needed for chest pain.  (Patient not taking: No sig reported)   No facility-administered encounter medications on file as of 01/28/2021.    Review of Systems  Constitutional: Negative for appetite change and unexpected weight change.  HENT: Negative for congestion and sinus pressure.   Respiratory: Negative for cough, chest tightness and shortness of breath.   Cardiovascular: Negative for chest pain, palpitations and leg swelling.  Gastrointestinal: Negative for abdominal pain, diarrhea, nausea and vomiting.  Genitourinary: Negative for difficulty urinating and dysuria.  Musculoskeletal: Negative for joint swelling and myalgias.   Skin: Negative for color change and rash.  Neurological: Negative for dizziness, light-headedness and headaches.  Psychiatric/Behavioral: Negative for agitation and dysphoric mood.       Objective:    Physical Exam Vitals reviewed.  Constitutional:      General: She is not in acute distress.    Appearance: Normal appearance.  HENT:     Head: Normocephalic and atraumatic.     Right Ear: External ear normal.     Left Ear: External ear normal.  Eyes:     General: No scleral icterus.       Right eye: No discharge.        Left eye: No discharge.     Conjunctiva/sclera: Conjunctivae normal.  Neck:     Thyroid: No thyromegaly.  Cardiovascular:     Rate and Rhythm: Normal rate and regular rhythm.  Pulmonary:     Effort: No respiratory distress.     Breath sounds: Normal breath sounds. No wheezing.  Abdominal:     General: Bowel sounds are normal.     Palpations: Abdomen is soft.     Tenderness: There is no abdominal tenderness.  Musculoskeletal:        General: No swelling or tenderness.     Cervical back: Neck supple. No tenderness.  Lymphadenopathy:     Cervical: No cervical adenopathy.  Skin:    Findings: No erythema or rash.  Neurological:     Mental Status: She is alert.  Psychiatric:        Mood and Affect: Mood normal.        Behavior: Behavior normal.     BP 102/60   Pulse 83   Temp 98.1 F (36.7 C) (Oral)   Resp 16   Ht _0  (1.6 m)   Wt 123 lb (55.8 kg)   LMP  (LMP Unknown)   SpO2 98%   BMI 21.79 kg/m  Wt Readings from Last 3 Encounters:  01/28/21 123 lb (  55.8 kg)  01/21/21 118 lb 9.6 oz (53.8 kg)  12/28/20 122 lb 1.6 oz (55.4 kg)     Lab Results  Component Value Date   WBC 4.5 01/20/2021   HGB 10.0 (L) 01/20/2021   HCT 31.8 (L) 01/20/2021   PLT 144 (L) 01/20/2021   GLUCOSE 112 (H) 01/28/2021   CHOL 99 07/23/2020   TRIG 60 07/23/2020   HDL 41 07/23/2020   LDLCALC 46 07/23/2020   ALT 16 01/28/2021   AST 21 01/28/2021   NA 138  01/28/2021   K 4.1 01/28/2021   CL 102 01/28/2021   CREATININE 0.77 01/28/2021   BUN 15 01/28/2021   CO2 30 01/28/2021   TSH 2.46 01/28/2021   INR 1.4 (H) 12/26/2020   HGBA1C 6.6 (H) 10/30/2020   MICROALBUR 65.4 (H) 03/09/2016    CT Head Wo Contrast  Result Date: 12/25/2020 CLINICAL DATA:  Nausea and vomiting with recent minor head trauma EXAM: CT HEAD WITHOUT CONTRAST TECHNIQUE: Contiguous axial images were obtained from the base of the skull through the vertex without intravenous contrast. COMPARISON:  07/22/2020 FINDINGS: Brain: Changes consistent with prior right MCA infarct are again noted and stable. Infarct in the right occipital lobe is seen as well. No acute hemorrhage, acute infarction or space-occupying mass lesion is noted. Vascular: No hyperdense vessel or unexpected calcification. Skull: Normal. Negative for fracture or focal lesion. Sinuses/Orbits: No acute finding. Other: None. 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 No acute abnormality noted. Electronically Signed   By: Inez Catalina M.D.   On: 12/25/2020 16:08   CT ABDOMEN PELVIS W CONTRAST  Result Date: 12/25/2020 CLINICAL DATA:  Abdominal distension, vomiting. EXAM: CT ABDOMEN AND PELVIS WITH CONTRAST TECHNIQUE: Multidetector CT imaging of the abdomen and pelvis was performed using the standard protocol following bolus administration of intravenous contrast. CONTRAST:  25m OMNIPAQUE IOHEXOL 300 MG/ML  SOLN COMPARISON:  March 27, 2016. FINDINGS: Lower chest: Small right pleural effusion is noted with adjacent subsegmental atelectasis. Hepatobiliary: Mild gallbladder distention is noted without cholelithiasis. Minimal intrahepatic and extrahepatic biliary dilatation is noted without obstructing calculus. Liver is otherwise unremarkable. Pancreas: Mild amount of fluid is seen surrounding the pancreas concerning for acute pancreatitis. No ductal dilatation or pseudocyst formation is noted. Spleen:  Normal in size without focal abnormality. Adrenals/Urinary Tract: Adrenal glands appear normal. Small nonobstructive calculus is noted in lower pole collecting system of left kidney. Small right renal cyst is noted. Mild right-sided hydroureteronephrosis is noted without obstructing calculus. Mild urinary bladder distention is noted. Stomach/Bowel: The stomach appears normal. There appears to be wall and fold thickening involving portions of the duodenum which may represent peptic ulcer disease or inflammation secondary to pancreatitis. Sigmoid diverticulosis is noted without inflammation. No definite evidence of bowel obstruction. Vascular/Lymphatic: Aortic atherosclerosis. No enlarged abdominal or pelvic lymph nodes. Reproductive: Status post hysterectomy. No adnexal masses. Other: No definite hernia is noted. Small amount of free fluid is noted in the pelvis. Musculoskeletal: No acute or significant osseous findings. IMPRESSION: Mild amount of fluid is seen surround the pancreas concerning for acute pancreatitis. No definite pseudocyst formation is noted. There appears to be wall and fold thickening involving portions of the adjacent duodenum which may represent peptic ulcer disease or inflammation secondary to pancreatitis. Small right pleural effusion is noted with adjacent subsegmental atelectasis. Minimal intrahepatic and extrahepatic biliary dilatation is noted without obstructing calculus. Mild gallbladder distention is noted without cholelithiasis. Correlation with liver function tests is recommended to  rule out obstruction. Mild right-sided hydroureteronephrosis is noted without obstructing calculus. Mild urinary bladder distention is noted. Aortic Atherosclerosis (ICD10-I70.0). Electronically Signed   By: Marijo Conception M.D.   On: 12/25/2020 16:18   US Abdomen Limited RUQ (LIVER/GB)  Result Date: 12/25/2020 CLINICAL DATA:  Pancreatitis EXAM: ULTRASOUND ABDOMEN LIMITED RIGHT UPPER QUADRANT  COMPARISON:  CT 12/25/2020 FINDINGS: Gallbladder: Shadowing stones measuring up to 5 mm. Normal wall thickness. No sonographic Murphy. Common bile duct: Diameter: 3.4 mm Liver: No focal lesion identified. Within normal limits in parenchymal echogenicity. Portal vein is patent on color Doppler imaging with normal direction of blood flow towards the liver. Other: Small right pleural effusion. Right kidney may be slightly echogenic. IMPRESSION: 1. Gallstones without evidence for acute cholecystitis. 2. Small right pleural effusion 3. Right kidney may be slightly echogenic, correlate with appropriate laboratory values. Electronically Signed   By: Donavan Foil M.D.   On: 12/25/2020 19:40       Assessment & Plan:   Problem List Items Addressed This Visit    Atherosclerosis of coronary artery with angina pectoris ([place])    Continue isosorbide, metoprolol and lipitor.  Continue f/u with cardiology.       Diabetes mellitus with neuropathy ([place]) - Primary   Relevant Orders   Basic metabolic panel (Completed)   Urinalysis, Routine w reflex microscopic (Completed)   Hepatic function panel (Completed)   Hyperlipidemia    Continue lipitor.  Follow lipid panel and liver function tests.       Relevant Orders   TSH (Completed)   Iron deficiency anemia    Follow cbc and iron studies.       Malignant neoplasm of left female breast ([place])    S/p XRT. Continue tamoxifen.       Osteoporosis    Planning to start prolia.  Check labs, including vitamin  D level , calcium and kidney function.       Paroxysmal A-fib ([facility])    Continue eliquis and metoprolol.  Stable.  Follow.       Stress    Has good support.  Follow.       Type II diabetes mellitus ([place])    Continues on metformin - given elevated blood sugars.  Follow met b and a1c.       Relevant Orders   Basic metabolic panel (Completed)   Urinalysis, Routine w reflex microscopic (Completed)   Hepatic function panel (Completed)   Visual  hallucinations    On depakote and remeron.  Continue f/u with Dr Nicolasa Ducking.       Vitamin D deficiency, unspecified   Relevant Orders   VITAMIN D 25 Hydroxy (Vit-D Deficiency, Fractures) (Completed)    Other Visit Diagnoses    Medication management       Relevant Orders   Valproic acid level (Completed)       [name], MD

## 2021-02-02 NOTE — Assessment & Plan Note (Signed)
Continue eliquis and metoprolol.  Stable.  Follow.

## 2021-02-02 NOTE — Assessment & Plan Note (Signed)
S/p XRT. Continue tamoxifen.

## 2021-02-03 ENCOUNTER — Inpatient Hospital Stay: Payer: Medicare Other

## 2021-02-03 DIAGNOSIS — Z79810 Long term (current) use of selective estrogen receptor modulators (SERMs): Secondary | ICD-10-CM | POA: Diagnosis not present

## 2021-02-03 DIAGNOSIS — Z79899 Other long term (current) drug therapy: Secondary | ICD-10-CM | POA: Diagnosis not present

## 2021-02-03 DIAGNOSIS — C50412 Malignant neoplasm of upper-outer quadrant of left female breast: Secondary | ICD-10-CM | POA: Diagnosis not present

## 2021-02-03 DIAGNOSIS — M810 Age-related osteoporosis without current pathological fracture: Secondary | ICD-10-CM

## 2021-02-03 DIAGNOSIS — Z170 Estrogen receptor positive status [ER+]: Secondary | ICD-10-CM | POA: Diagnosis not present

## 2021-02-03 DIAGNOSIS — D649 Anemia, unspecified: Secondary | ICD-10-CM | POA: Diagnosis not present

## 2021-02-03 MED ORDER — DENOSUMAB 60 MG/ML ~~LOC~~ SOSY
60.0000 mg | PREFILLED_SYRINGE | Freq: Once | SUBCUTANEOUS | Status: AC
Start: 2021-02-03 — End: 2021-02-03
  Administered 2021-02-03: 60 mg via SUBCUTANEOUS
  Filled 2021-02-03: qty 1

## 2021-02-05 ENCOUNTER — Encounter: Payer: Self-pay | Admitting: Internal Medicine

## 2021-02-11 DIAGNOSIS — H353211 Exudative age-related macular degeneration, right eye, with active choroidal neovascularization: Secondary | ICD-10-CM | POA: Diagnosis not present

## 2021-02-12 ENCOUNTER — Ambulatory Visit (INDEPENDENT_AMBULATORY_CARE_PROVIDER_SITE_OTHER): Payer: Medicare Other | Admitting: Podiatry

## 2021-02-12 ENCOUNTER — Encounter: Payer: Self-pay | Admitting: Podiatry

## 2021-02-12 ENCOUNTER — Other Ambulatory Visit: Payer: Self-pay

## 2021-02-12 DIAGNOSIS — E1142 Type 2 diabetes mellitus with diabetic polyneuropathy: Secondary | ICD-10-CM | POA: Diagnosis not present

## 2021-02-12 DIAGNOSIS — L97522 Non-pressure chronic ulcer of other part of left foot with fat layer exposed: Secondary | ICD-10-CM | POA: Diagnosis not present

## 2021-02-12 DIAGNOSIS — L97511 Non-pressure chronic ulcer of other part of right foot limited to breakdown of skin: Secondary | ICD-10-CM

## 2021-02-12 DIAGNOSIS — D2371 Other benign neoplasm of skin of right lower limb, including hip: Secondary | ICD-10-CM | POA: Diagnosis not present

## 2021-02-12 DIAGNOSIS — D2372 Other benign neoplasm of skin of left lower limb, including hip: Secondary | ICD-10-CM

## 2021-02-12 DIAGNOSIS — M79609 Pain in unspecified limb: Secondary | ICD-10-CM | POA: Diagnosis not present

## 2021-02-12 DIAGNOSIS — B351 Tinea unguium: Secondary | ICD-10-CM | POA: Diagnosis not present

## 2021-02-12 NOTE — Progress Notes (Signed)
She presents today to pick up her diabetic shoes as well as have her nails and calluses trimmed.  Objective: Vital signs stable she alert and x3 pulses are diminished 1/4 PT and DP bilaterally.  Capillary fill time is somewhat sluggish but no open lesions or wounds at this time.  Toenails are long thick yellow dystrophic and clinically mycotic multiple reactive hyperkeratotic tissue across the forefoot bilateral.  Assessment: Pain in limb secondary to diabetic peripheral neuropathy pain Litzinger onychomycosis and benign skin lesions.  Plan: Debridement of benign skin lesion bilaterally debridement of toenails 1 through 5 bilaterally and dispensed diabetic shoes with diabetic insoles and provided her with information regarding break-in period and follow-up with me in the next few weeks.

## 2021-02-18 ENCOUNTER — Other Ambulatory Visit: Payer: Self-pay | Admitting: General Surgery

## 2021-02-18 DIAGNOSIS — R220 Localized swelling, mass and lump, head: Secondary | ICD-10-CM | POA: Diagnosis not present

## 2021-02-18 NOTE — Progress Notes (Signed)
Subjective:     Patient ID: Holly Rose is a 87 y.o. female.  HPI  The following portions of the patient's history were reviewed and updated as appropriate.  This an established patient is here today for: office visit. She is here for evaluation of a scalp cyst. This area was noticed about 3 months ago, progressively gotten larger. Mild discomfort, no drainage.  Arrangements have been made for the patient to be seen by dermatology, but the patient's daughter-in-law reports that she does not like the dermatologist and she decided to come here.  She is here with her daughter in law, Holly Rose.  The patient is lost significant weight over the last couple of years.  Review of Systems  Constitutional: Negative for chills and fever.  Respiratory: Negative for cough.         Chief Complaint  Patient presents with  . Follow-up    Scalp cyst     BP 104/54   Pulse 88   Temp 36.6 C (97.8 F)   Ht 157.5 cm (5' 2")   Wt 56.2 kg (124 lb)   LMP  (LMP Unknown)   SpO2 97%   BMI 22.68 kg/m    At the time of my last evaluation of the patient in August 2019 in regards to her breast cancer she weighed 160 pounds.      Past Medical History:  Diagnosis Date  . A-fib (CMS-HCC) 2004  . Anemia 2005  . Arthritis   . CAD (coronary artery disease)   . Cardiac murmur   . Complication of anesthesia   . Dysrhythmia   . Heart attack (CMS-HCC) 03/2007  . History of kidney stones   . History of sick sinus syndrome   . Hyperlipidemia   . Macular degeneration 10/2010  . Malignant neoplasm of left breast (CMS-HCC) 2018  . Neuropathy 2003  . Personal history of radiation therapy   . Squamous cell skin cancer   . TIA (transient ischemic attack)   . Type 2 diabetes mellitus (CMS-HCC) 2002          Past Surgical History:  Procedure Laterality Date  . APPENDECTOMY  1947  . ASPIRATION CYST BREAST Left 03/2017  . bladder reconstruction  1996  . Bladder  surgery  1975  . bladder surgery  1995  . BREAST EXCISIONAL BIOPSY Left 04/08/2017  . BREAST EXCISIONAL BIOPSY Left 12/17/2016  . BREAST EXCISIONAL BIOPSY Left 01/18/1986  . breast surgery  1988  . cardiac cath  1989  . cardiac catheterization  1989  . CATARACT EXTRACTION    . CATARACT EXTRACTION  1997  . CATARACT EXTRACTION  1998  . COLONOSCOPY  09/12/2015   Adenomatous Polyps: No repeat due to age per RTE (dw)  . CYSTECTOMY Left 05/2017   left breast  . CYSTOURETHROSCOPY W/INSERTION URETHRAL STENT  11/23/2016  . HYSTERECTOMY  1960  . implantable cardioverter defibrillator generator change Left 03/29/2019  . INSERT / REPLACE / REMOVE PACEMAKER  2008  . kidney stone surgery  2018  . MASTECTOMY PARTIAL / LUMPECTOMY Left 05/26/2017  . MOHS SURGERY  2019  . REPAIR RECTOCELE  1996  . TONSILLECTOMY  1950  . TRANSVENOUS INSERTION PACEMAKER SINGLE CHAMBER LEADS  06/24/2010      OB History   No obstetric history on file.     Social History          Socioeconomic History  . Marital status: Married  Tobacco Use  . Smoking status: Former Smoker      Years: 5.00    Types: Cigarettes    Quit date: 1970    Years since quitting: 52.4  . Smokeless tobacco: Never Used  Vaping Use  . Vaping Use: Never used  Substance and Sexual Activity  . Alcohol use: No  . Drug use: No  . Sexual activity: Defer            Allergies  Allergen Reactions  . Neurontin [Gabapentin] Unknown    Other reaction(s): OTHER Other reaction(s): Other (See Comments) Out of it  . Pregabalin Swelling    Other reaction(s): SWELLING/EDEMA Other reaction(s): SWELLING/EDEMA Sleepy, does not eat  . Sulfamethoxazole-Trimethoprim Other (See Comments)    Made her dizziness    Current Medications        Current Outpatient Medications  Medication Sig Dispense Refill  . aflibercept (EYLEA) 2 mg/0.05 mL injection 2 mg by Intravitreal route    . apixaban  (ELIQUIS) 2.5 mg tablet Take 1 tablet (2.5 mg total) by mouth 2 (two) times daily Take 1 tablet (2.5 mg total) by mouth 2 (two) times daily. 60 tablet 11  . atorvastatin (LIPITOR) 40 MG tablet Take 1 tablet (40 mg total) by mouth once daily 30 tablet 11  . blood glucose diagnostic test strip Use 1 each (1 strip total) 2 (two) times daily Use as instructed. 200 each 3  . blood glucose meter kit Use once daily to check blood sugar 1 each 0  . cholecalciferol (VITAMIN D3) 1,000 unit tablet Take 1,000 Units by mouth once daily.    . cyanocobalamin (VITAMIN B12) 1000 MCG tablet Take 1,000 mcg by mouth once daily.    Marland Kitchen denosumab (PROLIA) 60 mg/mL inj syringe Inject subcutaneously once    . divalproex (DEPAKOTE) 125 MG DR tablet Take one tablet q hs for one week and then one tablet bid    . FUROsemide (LASIX) 20 MG tablet Take 2 tablets by mouth twice daily 60 tablet 6  . isosorbide mononitrate (IMDUR) 30 MG ER tablet Take 1 tablet by mouth once daily 90 tablet 3  . LACTOBAC NO.41/BIFIDOBACT NO.7 (PROBIOTIC-10 ORAL) Take by mouth.    . lancets Use 1 each 2 (two) times daily Use as instructed. 200 each 3  . magnesium 200 mg Take by mouth    . meclizine (ANTIVERT) 12.5 mg tablet Take by mouth    . metFORMIN (GLUCOPHAGE-XR) 500 MG XR tablet Take 1 tablet (500 mg total) by mouth 2 (two) times daily 180 tablet 4  . metoprolol tartrate (LOPRESSOR) 25 MG tablet Take 1 tablet (25 mg total) by mouth 2 (two) times daily 60 tablet 11  . mirtazapine (REMERON) 7.5 MG tablet Take 7.5 mg by mouth nightly    . MULTIVIT-MINERALS/FERROUS FUM (MULTI VITAMIN ORAL) Take by mouth.    . nitroGLYcerin (NITROSTAT) 0.4 MG SL tablet Place 1 tablet (0.4 mg total) under the tongue every 5 (five) minutes as needed for Chest pain May take up to 3 doses. 25 tablet 11  . tamoxifen (NOLVADEX) 20 MG tablet Take by mouth    . vitamins  A,C,E-zinc-copper [id number] unit-mg-unit Take 1 capsule by mouth once daily.     Marland Kitchen alendronate (FOSAMAX) 70 MG tablet Take 70 mg by mouth every 7 (seven) days Take with a full glass of water. Do not lie down for the next 30 min. (Patient not taking: Reported on 02/18/2021)     No current facility-administered medications for this visit.           Family  History  Problem Relation Age of Onset  . Stroke Mother   . High blood pressure (Hypertension) Mother   . Diabetes Mother   . Cirrhosis Father        not r/t etoh use  . Diabetes Father   . High blood pressure (Hypertension) Father   . Kidney disease Brother   . Lung cancer Brother   . Kidney disease Brother   . Lung cancer Brother   . Kidney cancer Brother   . Prostate cancer Brother   . No Known Problems Brother   . No Known Problems Brother   . Stroke Maternal Grandmother   . No Known Problems Maternal Grandfather   . No Known Problems Paternal Grandmother   . No Known Problems Paternal Grandfather   . Diabetes Daughter   . Glaucoma Daughter   . Cancer Son   . Diabetes Son 19  . Lung cancer Son   . Lung cancer Maternal Aunt   . High blood pressure (Hypertension) Maternal Aunt   . Stroke Maternal Aunt   . Breast cancer Maternal Aunt             Objective:   Physical Exam Exam conducted with a chaperone present.  Constitutional:      Appearance: Normal appearance.  HENT:     Head:      Comments: 2.5 cm ulcerated mass with suggestion of central necrosis on the left side of the scalp overlying the parietal occipital junction. Cardiovascular:     Rate and Rhythm: Normal rate. Rhythm irregular.     Pulses: Normal pulses.     Heart sounds: Normal heart sounds.  Pulmonary:     Effort: Pulmonary effort is normal.     Breath sounds: Normal breath sounds.  Chest:  Breasts:     Right: No supraclavicular adenopathy.     Left: No supraclavicular adenopathy.    Musculoskeletal:     Cervical back: Neck supple.  Lymphadenopathy:     Cervical: No  cervical adenopathy.     Upper Body:     Right upper body: No supraclavicular adenopathy.     Left upper body: No supraclavicular adenopathy.  Skin:    General: Skin is warm and dry.  Neurological:     Mental Status: She is alert and oriented to person, place, and time.  Psychiatric:        Mood and Affect: Mood normal.        Behavior: Behavior normal.    Labs and Radiology:   CTA of July 22, 2020 report reviewed with findings of focal cerebral impaired circulation. IMPRESSION: 1. Negative CTA for large vessel occlusion. 2. Short-segment severe proximal right P2 stenosis. The right PCA is attenuated distally but remains patent. Finding in keeping with the chronic right PCA territory infarct. 3. Short-segment 40% atheromatous stenosis at the origin of the left ICA. 4. Short-segment severe stenosis at the origin of the right external carotid artery. 5. Small layering bilateral pleural effusions. 6. Emphysema (ICD10-J43.9).  December 25, 2020 CT of the head:  IMPRESSION: Chronic infarcts on the right stable from the prior exam.  No acute abnormality noted.  Jan 20, 2021 CBC:  WBC 4.0 - 10.5 K/uL 4.5   RBC 3.87 - 5.11 MIL/uL 3.40Low   Hemoglobin 12.0 - 15.0 g/dL 10.0Low   HCT 36.0 - 46.0 % 31.8Low   MCV 80.0 - 100.0 fL 93.5   MCH 26.0 - 34.0 pg 29.4   MCHC 30.0 - 36.0 g/dL 31.4  RDW 11.5 - 15.5 % 16.4High   Platelets 150 - 400 K/uL 144Low   nRBC 0.0 - 0.2 % 0.0   Neutrophils Relative % % 70   Neutro Abs 1.7 - 7.7 K/uL 3.1   Lymphocytes Relative % 23   Lymphs Abs 0.7 - 4.0 K/uL 1.0   Monocytes Relative % 7   Monocytes Absolute 0.1 - 1.0 K/uL 0.3   Eosinophils Relative % 0   Eosinophils Absolute 0.0 - 0.5 K/uL 0.0   Basophils Relative % 0   Basophils Absolute 0.0 - 0.1 K/uL 0.0   Immature Granulocytes % 0   Abs Immature Granulocytes 0.00 - 0.07 K/uL 0.01    Hepatic function panel Jan 28, 2021:  Total Bilirubin 0.2 - 1.2 mg/dL 0.3    Bilirubin, Direct 0.0 - 0.3 mg/dL 0.1   Alkaline Phosphatase 39 - 117 U/L 49   AST 0 - 37 U/L 21   ALT 0 - 35 U/L 16   Total Protein 6.0 - 8.3 g/dL 7.0   Albumin 3.5 - 5.2 g/dL 3.3Low     Ref Range & Units 3 wk ago  Sodium 135 - 145 mEq/L 138   Potassium 3.5 - 5.1 mEq/L 4.1   Chloride 96 - 112 mEq/L 102   CO2 19 - 32 mEq/L 30   Glucose, Bld 70 - 99 mg/dL 112High   BUN 6 - 23 mg/dL 15   Creatinine, Ser 0.40 - 1.20 mg/dL 0.77   GFR >60.00 mL/min 68.50   Comment: Calculated using the CKD-EPI Creatinine Equation (2021)  Calcium 8.4 - 10.5 mg/dL 9.8        Assessment:     Enlarging mass on the left vertex of the scalp, possible squamous cell carcinoma.  Ongoing Eliquis therapy for atrial fibrillation and prior stroke.      Plan:     Secure chat messaging with neurology and cardiology undertaken.  Acceptable to hold Eliquis for 2 days prior to the procedure.  This may not be able to be closed primarily.  It may be necessary to rotate a local flap or make use of a biologic dressing for covering.  With her platelet count being a modestly low, this would be best completed under local anesthesia with sedation.    This note is partially prepared by Karie Fetch, RN, acting as a scribe in the presence of Dr. Hervey Ard, MD.  The documentation recorded by the scribe accurately reflects the service I personally performed and the decisions made by me.   [name], MD FACS

## 2021-02-19 ENCOUNTER — Other Ambulatory Visit: Payer: Self-pay

## 2021-02-19 ENCOUNTER — Encounter: Payer: Self-pay | Admitting: *Deleted

## 2021-02-19 ENCOUNTER — Emergency Department: Payer: Medicare Other

## 2021-02-19 ENCOUNTER — Inpatient Hospital Stay
Admission: EM | Admit: 2021-02-19 | Discharge: 2021-02-26 | DRG: 418 | Disposition: A | Discharge status: Home Health | Payer: Medicare Other | Attending: Internal Medicine | Admitting: Internal Medicine

## 2021-02-19 DIAGNOSIS — R5381 Other malaise: Secondary | ICD-10-CM | POA: Diagnosis not present

## 2021-02-19 DIAGNOSIS — R64 Cachexia: Secondary | ICD-10-CM | POA: Diagnosis present

## 2021-02-19 DIAGNOSIS — Z882 Allergy status to sulfonamides status: Secondary | ICD-10-CM

## 2021-02-19 DIAGNOSIS — E1122 Type 2 diabetes mellitus with diabetic chronic kidney disease: Secondary | ICD-10-CM | POA: Diagnosis not present

## 2021-02-19 DIAGNOSIS — I129 Hypertensive chronic kidney disease with stage 1 through stage 4 chronic kidney disease, or unspecified chronic kidney disease: Secondary | ICD-10-CM | POA: Diagnosis present

## 2021-02-19 DIAGNOSIS — Z85828 Personal history of other malignant neoplasm of skin: Secondary | ICD-10-CM | POA: Diagnosis not present

## 2021-02-19 DIAGNOSIS — Z833 Family history of diabetes mellitus: Secondary | ICD-10-CM

## 2021-02-19 DIAGNOSIS — Z87891 Personal history of nicotine dependence: Secondary | ICD-10-CM

## 2021-02-19 DIAGNOSIS — F0150 Vascular dementia without behavioral disturbance: Secondary | ICD-10-CM | POA: Diagnosis not present

## 2021-02-19 DIAGNOSIS — Z853 Personal history of malignant neoplasm of breast: Secondary | ICD-10-CM

## 2021-02-19 DIAGNOSIS — K859 Acute pancreatitis without necrosis or infection, unspecified: Secondary | ICD-10-CM | POA: Diagnosis not present

## 2021-02-19 DIAGNOSIS — Z6820 Body mass index (BMI) 20.0-20.9, adult: Secondary | ICD-10-CM

## 2021-02-19 DIAGNOSIS — Z7901 Long term (current) use of anticoagulants: Secondary | ICD-10-CM | POA: Diagnosis not present

## 2021-02-19 DIAGNOSIS — F320 Major depressive disorder, single episode, mild: Secondary | ICD-10-CM | POA: Diagnosis present

## 2021-02-19 DIAGNOSIS — I340 Nonrheumatic mitral (valve) insufficiency: Secondary | ICD-10-CM | POA: Diagnosis not present

## 2021-02-19 DIAGNOSIS — Z66 Do not resuscitate: Secondary | ICD-10-CM | POA: Diagnosis present

## 2021-02-19 DIAGNOSIS — I4891 Unspecified atrial fibrillation: Secondary | ICD-10-CM | POA: Diagnosis not present

## 2021-02-19 DIAGNOSIS — K8050 Calculus of bile duct without cholangitis or cholecystitis without obstruction: Secondary | ICD-10-CM | POA: Diagnosis not present

## 2021-02-19 DIAGNOSIS — F0390 Unspecified dementia without behavioral disturbance: Secondary | ICD-10-CM | POA: Diagnosis present

## 2021-02-19 DIAGNOSIS — Z7189 Other specified counseling: Secondary | ICD-10-CM | POA: Diagnosis not present

## 2021-02-19 DIAGNOSIS — E785 Hyperlipidemia, unspecified: Secondary | ICD-10-CM | POA: Diagnosis present

## 2021-02-19 DIAGNOSIS — K811 Chronic cholecystitis: Secondary | ICD-10-CM | POA: Diagnosis not present

## 2021-02-19 DIAGNOSIS — Z515 Encounter for palliative care: Secondary | ICD-10-CM | POA: Diagnosis not present

## 2021-02-19 DIAGNOSIS — Z823 Family history of stroke: Secondary | ICD-10-CM

## 2021-02-19 DIAGNOSIS — I493 Ventricular premature depolarization: Secondary | ICD-10-CM | POA: Diagnosis present

## 2021-02-19 DIAGNOSIS — K861 Other chronic pancreatitis: Secondary | ICD-10-CM | POA: Diagnosis present

## 2021-02-19 DIAGNOSIS — R1084 Generalized abdominal pain: Secondary | ICD-10-CM | POA: Diagnosis not present

## 2021-02-19 DIAGNOSIS — N183 Chronic kidney disease, stage 3 unspecified: Secondary | ICD-10-CM | POA: Diagnosis not present

## 2021-02-19 DIAGNOSIS — Z20822 Contact with and (suspected) exposure to covid-19: Secondary | ICD-10-CM | POA: Diagnosis present

## 2021-02-19 DIAGNOSIS — H3530 Unspecified macular degeneration: Secondary | ICD-10-CM | POA: Diagnosis present

## 2021-02-19 DIAGNOSIS — M810 Age-related osteoporosis without current pathological fracture: Secondary | ICD-10-CM | POA: Diagnosis present

## 2021-02-19 DIAGNOSIS — Z923 Personal history of irradiation: Secondary | ICD-10-CM | POA: Diagnosis not present

## 2021-02-19 DIAGNOSIS — Z8673 Personal history of transient ischemic attack (TIA), and cerebral infarction without residual deficits: Secondary | ICD-10-CM

## 2021-02-19 DIAGNOSIS — K851 Biliary acute pancreatitis without necrosis or infection: Secondary | ICD-10-CM | POA: Diagnosis not present

## 2021-02-19 DIAGNOSIS — R109 Unspecified abdominal pain: Secondary | ICD-10-CM | POA: Diagnosis not present

## 2021-02-19 DIAGNOSIS — R1111 Vomiting without nausea: Secondary | ICD-10-CM | POA: Diagnosis not present

## 2021-02-19 DIAGNOSIS — C50912 Malignant neoplasm of unspecified site of left female breast: Secondary | ICD-10-CM | POA: Diagnosis present

## 2021-02-19 DIAGNOSIS — Z950 Presence of cardiac pacemaker: Secondary | ICD-10-CM

## 2021-02-19 DIAGNOSIS — R52 Pain, unspecified: Secondary | ICD-10-CM | POA: Diagnosis not present

## 2021-02-19 DIAGNOSIS — C4442 Squamous cell carcinoma of skin of scalp and neck: Secondary | ICD-10-CM | POA: Diagnosis not present

## 2021-02-19 DIAGNOSIS — Z90710 Acquired absence of both cervix and uterus: Secondary | ICD-10-CM

## 2021-02-19 DIAGNOSIS — I2510 Atherosclerotic heart disease of native coronary artery without angina pectoris: Secondary | ICD-10-CM | POA: Diagnosis present

## 2021-02-19 DIAGNOSIS — K8020 Calculus of gallbladder without cholecystitis without obstruction: Secondary | ICD-10-CM | POA: Diagnosis not present

## 2021-02-19 DIAGNOSIS — I959 Hypotension, unspecified: Secondary | ICD-10-CM | POA: Diagnosis not present

## 2021-02-19 DIAGNOSIS — Z9049 Acquired absence of other specified parts of digestive tract: Secondary | ICD-10-CM

## 2021-02-19 DIAGNOSIS — F32A Depression, unspecified: Secondary | ICD-10-CM | POA: Diagnosis present

## 2021-02-19 DIAGNOSIS — I4821 Permanent atrial fibrillation: Secondary | ICD-10-CM | POA: Diagnosis not present

## 2021-02-19 DIAGNOSIS — K8070 Calculus of gallbladder and bile duct without cholecystitis without obstruction: Secondary | ICD-10-CM | POA: Diagnosis present

## 2021-02-19 DIAGNOSIS — N1831 Chronic kidney disease, stage 3a: Secondary | ICD-10-CM | POA: Diagnosis present

## 2021-02-19 DIAGNOSIS — Z7984 Long term (current) use of oral hypoglycemic drugs: Secondary | ICD-10-CM

## 2021-02-19 DIAGNOSIS — R001 Bradycardia, unspecified: Secondary | ICD-10-CM | POA: Diagnosis not present

## 2021-02-19 DIAGNOSIS — Z803 Family history of malignant neoplasm of breast: Secondary | ICD-10-CM

## 2021-02-19 DIAGNOSIS — I25119 Atherosclerotic heart disease of native coronary artery with unspecified angina pectoris: Secondary | ICD-10-CM | POA: Diagnosis present

## 2021-02-19 DIAGNOSIS — R220 Localized swelling, mass and lump, head: Secondary | ICD-10-CM | POA: Diagnosis present

## 2021-02-19 DIAGNOSIS — Z888 Allergy status to other drugs, medicaments and biological substances status: Secondary | ICD-10-CM

## 2021-02-19 DIAGNOSIS — Z86010 Personal history of colonic polyps: Secondary | ICD-10-CM

## 2021-02-19 DIAGNOSIS — I252 Old myocardial infarction: Secondary | ICD-10-CM

## 2021-02-19 DIAGNOSIS — Z79899 Other long term (current) drug therapy: Secondary | ICD-10-CM

## 2021-02-19 DIAGNOSIS — I495 Sick sinus syndrome: Secondary | ICD-10-CM | POA: Diagnosis not present

## 2021-02-19 LAB — COMPREHENSIVE METABOLIC PANEL
ALT: 56 U/L — ABNORMAL HIGH (ref 0–44)
AST: 167 U/L — ABNORMAL HIGH (ref 15–41)
Albumin: 3.4 g/dL — ABNORMAL LOW (ref 3.5–5.0)
Alkaline Phosphatase: 47 U/L (ref 38–126)
Anion gap: 8 (ref 5–15)
BUN: 25 mg/dL — ABNORMAL HIGH (ref 8–23)
CO2: 25 mmol/L (ref 22–32)
Calcium: 9.5 mg/dL (ref 8.9–10.3)
Chloride: 103 mmol/L (ref 98–111)
Creatinine, Ser: 0.77 mg/dL (ref 0.44–1.00)
GFR, Estimated: >60 mL/min (ref >60)
Glucose, Bld: 99 mg/dL (ref 70–99)
Potassium: 4.2 mmol/L (ref 3.5–5.1)
Sodium: 136 mmol/L (ref 135–145)
Total Bilirubin: 1.6 mg/dL — ABNORMAL HIGH (ref 0.3–1.2)
Total Protein: 7.6 g/dL (ref 6.5–8.1)

## 2021-02-19 LAB — CBC
HCT: 32.4 % — ABNORMAL LOW (ref 36.0–46.0)
Hemoglobin: 10.4 g/dL — ABNORMAL LOW (ref 12.0–15.0)
MCH: 30 pg (ref 26.0–34.0)
MCHC: 32.1 g/dL (ref 30.0–36.0)
MCV: 93.4 fL (ref 80.0–100.0)
Platelets: 142 10*3/uL — ABNORMAL LOW (ref 150–400)
RBC: 3.47 MIL/uL — ABNORMAL LOW (ref 3.87–5.11)
RDW: 15.9 % — ABNORMAL HIGH (ref 11.5–15.5)
WBC: 11.4 10*3/uL — ABNORMAL HIGH (ref 4.0–10.5)
nRBC: 0 % (ref 0.0–0.2)

## 2021-02-19 LAB — GLUCOSE, CAPILLARY: Glucose-Capillary: 70 mg/dL (ref 70–99)

## 2021-02-19 LAB — SARS CORONAVIRUS 2 (TAT 6-24 HRS): SARS Coronavirus 2: NEGATIVE

## 2021-02-19 LAB — TROPONIN I (HIGH SENSITIVITY): Troponin I (High Sensitivity): 7 ng/L (ref <18)

## 2021-02-19 LAB — LIPASE, BLOOD: Lipase: 6969 U/L — ABNORMAL HIGH (ref 11–51)

## 2021-02-19 LAB — CBG MONITORING, ED: Glucose-Capillary: 89 mg/dL (ref 70–99)

## 2021-02-19 MED ORDER — VITAMIN D 25 MCG (1000 UNIT) PO TABS
4000.0000 [IU] | ORAL_TABLET | Freq: Every day | ORAL | Status: DC
  Administered 2021-02-19 – 2021-02-26 (×7): 4000 [IU] via ORAL
  Filled 2021-02-19 (×8): qty 4

## 2021-02-19 MED ORDER — ISOSORBIDE MONONITRATE ER 30 MG PO TB24
30.0000 mg | ORAL_TABLET | Freq: Every day | ORAL | Status: DC
  Administered 2021-02-19 – 2021-02-26 (×8): 30 mg via ORAL
  Filled 2021-02-19 (×9): qty 1

## 2021-02-19 MED ORDER — DIVALPROEX SODIUM ER 250 MG PO TB24
250.0000 mg | ORAL_TABLET | Freq: Every day | ORAL | Status: DC
  Administered 2021-02-19 – 2021-02-25 (×7): 250 mg via ORAL
  Filled 2021-02-19 (×9): qty 1

## 2021-02-19 MED ORDER — MORPHINE SULFATE (PF) 2 MG/ML IV SOLN
1.0000 mg | INTRAVENOUS | Status: DC | PRN
  Administered 2021-02-24 – 2021-02-25 (×4): 1 mg via INTRAVENOUS
  Filled 2021-02-19 (×4): qty 1

## 2021-02-19 MED ORDER — CYANOCOBALAMIN 500 MCG PO TABS
500.0000 ug | ORAL_TABLET | Freq: Two times a day (BID) | ORAL | Status: DC
  Administered 2021-02-19 – 2021-02-26 (×14): 500 ug via ORAL
  Filled 2021-02-19 (×16): qty 1

## 2021-02-19 MED ORDER — ONDANSETRON HCL 4 MG/2ML IJ SOLN
4.0000 mg | Freq: Once | INTRAMUSCULAR | Status: AC
  Administered 2021-02-19: 4 mg via INTRAVENOUS
  Filled 2021-02-19: qty 2

## 2021-02-19 MED ORDER — ACETAMINOPHEN 325 MG PO TABS
650.0000 mg | ORAL_TABLET | Freq: Four times a day (QID) | ORAL | Status: DC | PRN
  Administered 2021-02-25 – 2021-02-26 (×3): 650 mg via ORAL
  Filled 2021-02-19 (×3): qty 2

## 2021-02-19 MED ORDER — ACETAMINOPHEN 325 MG PO TABS: 650.0000 mg | ORAL_TABLET | Freq: Four times a day (QID) | ORAL | Status: DC | PRN

## 2021-02-19 MED ORDER — MAGNESIUM OXIDE -MG SUPPLEMENT 400 (240 MG) MG PO TABS
200.0000 mg | ORAL_TABLET | Freq: Two times a day (BID) | ORAL | Status: DC
  Administered 2021-02-19 – 2021-02-26 (×14): 200 mg via ORAL
  Filled 2021-02-19 (×15): qty 1

## 2021-02-19 MED ORDER — ONDANSETRON HCL 4 MG PO TABS: 4.0000 mg | ORAL_TABLET | Freq: Four times a day (QID) | ORAL | Status: DC | PRN

## 2021-02-19 MED ORDER — SODIUM CHLORIDE 0.9 % IV SOLN: INTRAVENOUS | Status: DC

## 2021-02-19 MED ORDER — TAMOXIFEN CITRATE 10 MG PO TABS
20.0000 mg | ORAL_TABLET | Freq: Every day | ORAL | Status: DC
  Administered 2021-02-19 – 2021-02-26 (×7): 20 mg via ORAL
  Filled 2021-02-19 (×8): qty 2

## 2021-02-19 MED ORDER — PANTOPRAZOLE SODIUM 40 MG IV SOLR
40.0000 mg | INTRAVENOUS | Status: DC
  Administered 2021-02-19 – 2021-02-26 (×8): 40 mg via INTRAVENOUS
  Filled 2021-02-19 (×9): qty 40

## 2021-02-19 MED ORDER — FENTANYL CITRATE (PF) 100 MCG/2ML IJ SOLN
50.0000 ug | Freq: Once | INTRAMUSCULAR | Status: AC
Start: 2021-02-19 — End: 2021-02-19
  Administered 2021-02-19: 50 ug via INTRAVENOUS
  Filled 2021-02-19: qty 2

## 2021-02-19 MED ORDER — ONDANSETRON HCL 4 MG/2ML IJ SOLN: 4.0000 mg | Freq: Four times a day (QID) | INTRAMUSCULAR | Status: DC | PRN

## 2021-02-19 MED ORDER — OCUVITE-LUTEIN PO CAPS
1.0000 | ORAL_CAPSULE | Freq: Two times a day (BID) | ORAL | Status: DC
  Administered 2021-02-19 – 2021-02-26 (×14): 1 via ORAL
  Filled 2021-02-19 (×16): qty 1

## 2021-02-19 MED ORDER — MIRTAZAPINE 15 MG PO TABS
15.0000 mg | ORAL_TABLET | Freq: Every day | ORAL | Status: DC
  Administered 2021-02-19 – 2021-02-25 (×7): 15 mg via ORAL
  Filled 2021-02-19 (×7): qty 1

## 2021-02-19 MED ORDER — METOPROLOL TARTRATE 25 MG PO TABS
25.0000 mg | ORAL_TABLET | Freq: Two times a day (BID) | ORAL | Status: DC
  Administered 2021-02-19 – 2021-02-26 (×14): 25 mg via ORAL
  Filled 2021-02-19 (×14): qty 1

## 2021-02-19 NOTE — ED Triage Notes (Addendum)
Pt brought in via ems from home with abd pain   Pt reports vomiting tonight  Pt alert  Speech clear

## 2021-02-19 NOTE — ED Notes (Signed)
ED Provider at bedside. 

## 2021-02-19 NOTE — ED Provider Notes (Signed)
[facility] Emergency Department Provider Note  ____________________________________________  Time seen: Approximately 2:43 AM  I have reviewed the triage vital signs and the nursing notes.   HISTORY  Chief Complaint Abdominal Pain   HPI Holly Rose is a 87 y.o. female with a history of cholelithiasis, pancreatitis, diabetes, A. fib who presents for evaluation of abdominal pain.  Patient reports that her symptoms started tonight.  She is complaining of epigastric abdominal pain, sharp, severe, constant, associated with several episodes of nonbloody nonbilious emesis.  No diarrhea or constipation, no chest pain or shortness of breath, no fever or chills, no dysuria or hematuria.  Patient reports the symptoms are similar to her most recent flare of gallstone pancreatitis.   Past Medical History:  Diagnosis Date  . Allergy   . Anemia   . Arthritis   . Atrial fibrillation ([place]) 2004  . Breast cancer, left ([place]) 10/2017   Lumpectomy and rad tx's.   Marland Kitchen CAD (coronary artery disease)   . Complication of anesthesia    hard to wake up with general anesthesia  . Diabetes ([place])   . Dysrhythmia    A-fib  . Heart attack ([place]) 2008  . Heart murmur   . History of kidney stones   . History of sick sinus syndrome    s/p pacemaker placement  . Hyperlipidemia   . Macular degeneration   . Neuropathy   . Neuropathy   . Pacemaker 06/2010  . Personal history of radiation therapy 11/2017   LEFT lumpectomy   . Squamous cell skin cancer   . TIA (transient ischemic attack)     Patient Active Problem List   Diagnosis Date Noted  . Osteoporosis 01/22/2021  . Pancreatitis, acute 12/25/2020  . Pancreatitis 12/25/2020  . Dementia without behavioral disturbance ([place])   . Stroke ([place]) 07/22/2020  . TIA (transient ischemic attack) 07/22/2020  . Depression 07/22/2020  . Visual hallucinations 07/21/2020  . NSTEMI (non-ST elevated myocardial infarction) ([place])  04/17/2020  . Change in mental status 04/15/2020  . Head injury 04/15/2020  . Dysuria 04/30/2019  . Frequent urinary tract infections 04/30/2019  . Age-related osteoporosis without current pathological fracture 03/01/2019  . Dizziness 08/29/2018  . UTI (urinary tract infection) 08/15/2018  . Vitamin D deficiency, unspecified 06/16/2017  . Hypercalcinuria 06/14/2017  . SCC (squamous cell carcinoma), face 03/29/2017  . History of kidney stones 03/12/2017  . Malignant neoplasm of left female breast ([place]) 12/28/2016  . Chronic cystitis 11/05/2016  . Nephrolithiasis 11/05/2016  . Renal colic 11/26/9456  . Urge incontinence 11/05/2016  . Bilateral hand pain 11/03/2016  . Generalized osteoarthritis of hand 11/03/2016  . Numbness and tingling in both hands 11/03/2016  . Bradycardia 04/15/2016  . History of colonic polyps 09/16/2015  . Abdominal pain 08/18/2015  . Rectal bleeding 08/18/2015  . Atherosclerosis of coronary artery with angina pectoris ([place]) 02/17/2015  . Paroxysmal A-fib ([place]) 02/17/2015  . Diabetes mellitus with neuropathy ([place]) 02/17/2015  . Hyperlipidemia 02/17/2015  . Iron deficiency anemia 02/17/2015  . Macular degeneration 02/17/2015  . Stress 02/17/2015  . Health care maintenance 02/17/2015  . Diarrhea 02/17/2015  . Type II diabetes mellitus ([place]) 07/13/2014  . Diverticulosis 03/31/2013  . GI bleeding 03/31/2013  . Mitral valve prolapse 03/31/2013  . Diverticulosis of intestine without perforation or abscess without bleeding 03/31/2013    Past Surgical History:  Procedure Laterality Date  . ABDOMINAL HYSTERECTOMY  1960's  . APPENDECTOMY  1947  . BLADDER SURGERY  1975 and 1995  . BREAST BIOPSY Left 12/17/2016   SINGLE FRAGMENT OF ATYPICAL EPITHELIAL CELLS  . BREAST BIOPSY Left 04/08/2017   [facility]  . BREAST EXCISIONAL BIOPSY Left 01/18/1986   Benign microcalcifications, [employer].  Marland Kitchen BREAST LUMPECTOMY Left 05/26/2017   13 mm,T1c, N0; ER/ PR+, Her 2  neu negative, HIGH RISK by [place]ammoprint ;  Surgeon: [name], MD;  Location: [facility] ORS;  Service: General;  Laterality: Left;  . BREAST SURGERY  1988  . CARDIAC CATHETERIZATION  1989  . CATARACT EXTRACTION  1997 and 1998  . COLONOSCOPY WITH PROPOFOL N/A 09/12/2015   Procedure: COLONOSCOPY WITH PROPOFOL;  Surgeon: [name], MD;  Location[facility] ENDOSCOPY;  Service: Endoscopy;  Laterality: N/A;  . IMPLANTABLE CARDIOVERTER DEFIBRILLATOR (ICD) GENERATOR CHANGE Left 03/29/2019   Procedure: PACEMAKER CHANGE OUT;  Surgeon: [name], MD;  Location: [facility] ORS;  Service: Cardiovascular;  Laterality: Left;  . KIDNEY STONE SURGERY  2018  . MOHS SURGERY  2019   forehead  . pace maker  2008  . [place]  . TONSILLECTOMY AND ADENOIDECTOMY  1950"s    Prior to Admission medications   Medication Sig Start Date End Date Taking? Authorizing Provider  acetaminophen (TYLENOL) 325 MG tablet Take 2 tablets (650 mg total) by mouth every 6 (six) hours as needed for mild pain (or Fever >/= 101). 12/28/20   Domenic Polite, MD  Aflibercept 2 MG/0.05ML SOLN 1 each by Intravitreal route as directed.     [provider]  apixaban (ELIQUIS) 2.5 MG TABS tablet Take 1 tablet (2.5 mg total) by mouth 2 (two) times daily. 07/23/20   Lorella Nimrod, MD  atorvastatin (LIPITOR) 40 MG tablet Take 1 tablet (40 mg total) by mouth daily. 04/18/20   Loletha Grayer, MD  Biotin w/ Vitamins C & E (HAIR/SKIN/NAILS PO) Take 3 tablets by mouth daily. 2 in the am, 1 in the pm    [provider]  blood glucose meter kit and supplies Per patient please dispense One touch Ultra2 meter.Use meter and supplies three times a day to check blood sugar. ICD10: E11.40 07/25/15   [name], MD  Blood Glucose Monitoring Suppl (FREESTYLE LITE) DEVI 2 (two) times daily. 03/04/20   [provider]  Cholecalciferol (VITAMIN D) 50 MCG (2000 UT) CAPS Take 4,000 Units by mouth daily.    [provider]  divalproex (DEPAKOTE) 125 MG DR tablet Take 1 tablet (125 mg total) by mouth at bedtime. 12/28/20   Domenic Polite, MD  furosemide (LASIX) 20 MG tablet Take 1 tablet (20 mg total) by mouth daily. 12/28/20   Domenic Polite, MD  glucose blood (FREESTYLE LITE) test strip Used to check blood sugars twice a day. DX E11.9 11/08/20   [name], MD  isosorbide mononitrate (IMDUR) 30 MG 24 hr tablet Take 1 tablet (30 mg total) by mouth daily. 04/18/20   Loletha Grayer, MD  Lancets (FREESTYLE) lancets 2 (two) times daily. 03/02/20   [provider]  Magnesium 200 MG TABS Take 200 mg by mouth 2 (two) times a day.    [provider]  meclizine (ANTIVERT) 12.5 MG tablet Take 1 tablet (12.5 mg total) by mouth 2 (two) times daily as needed for dizziness. 02/07/20   [name], MD  metFORMIN (GLUCOPHAGE-XR) 500 MG 24 hr tablet Take 1 tablet by mouth twice daily 01/16/21   [name], MD  metoprolol tartrate (LOPRESSOR) 25 MG tablet Take 25 mg by mouth 2 (  two) times daily. 06/21/20   [provider]  mirtazapine (REMERON) 15 MG tablet Take 15 mg by mouth at bedtime. 11/17/20   [provider]  Multiple Vitamins-Minerals (PRESERVISION AREDS 2 PO) Take 1 tablet by mouth 2 (two) times daily.     [provider]  mupirocin ointment (BACTROBAN) 2 % Apply 1 application topically daily. 10/21/20   McDonald, Stephan Minister, DPM  OVER THE COUNTER MEDICATION Take 2 tablets by mouth in the morning and at bedtime. Energizing iron tablets    [provider]  Polyethyl Glycol-Propyl Glycol (SYSTANE OP) Apply 1 drop to eye 2 (two) times daily.    [provider]  Probiotic Product (PROBIOTIC DAILY PO) Take by mouth.    [provider]  tamoxifen (NOLVADEX) 20 MG tablet Take 1 tablet (20 mg total) by mouth daily. 10/23/20   [name], MD  vitamin B-12 (CYANOCOBALAMIN) 500 MCG tablet Take 500 mcg by mouth 2 (two) times a day.    [provider]    Allergies Lyrica [pregabalin], Neurontin [gabapentin], and Bactrim [sulfamethoxazole-trimethoprim]  Family History  Problem Relation Age of Onset  . Stroke Mother   . Diabetes Mother   . Cancer Maternal Aunt        Breast Cancer  . Breast cancer Maternal Aunt   . Arthritis Maternal Grandmother   . Cancer Maternal Aunt        Lung Cancer  . Diabetes Son   . Cancer Son   . Kidney disease Neg Hx   . Bladder Cancer Neg Hx     Social History Social History   Tobacco Use  . Smoking status: Former Smoker    Years: 5.00    Quit date: 09/14/1968    Years since quitting: 52.4  . Smokeless tobacco: Never Used  . Tobacco comment: quit 1970  Vaping Use  . Vaping Use: Never used  Substance Use Topics  . Alcohol use: No    Alcohol/week: 0.0 standard drinks  . Drug use: No    Review of Systems  Constitutional: Negative for fever. Eyes: Negative for visual changes. ENT: Negative for sore throat. Neck: No neck pain  Cardiovascular: Negative for chest pain. Respiratory: Negative for shortness of breath. Gastrointestinal: + abdominal pain, nausea, and vomiting . No diarrhea. Genitourinary: Negative for dysuria. Musculoskeletal: Negative for back pain. Skin: Negative for rash. Neurological: Negative for headaches, weakness or numbness. Psych: No SI or HI  ____________________________________________   PHYSICAL EXAM:  VITAL SIGNS: Vitals:   02/19/21 0500 02/19/21 0600  BP: 130/72 129/70  Pulse: 86 77  Resp: 15 14  Temp:    SpO2: 95% 97%    Constitutional: Alert and oriented,  in no apparent distress. HEENT:      Head: Normocephalic and atraumatic.         Eyes: Conjunctivae are normal. Sclera is non-icteric.       Mouth/Throat: Mucous membranes are moist.       Neck: Supple with no signs of meningismus. Cardiovascular: Regular rate and rhythm. No murmurs, gallops, or rubs. 2+ symmetrical distal pulses are present in all extremities. No  JVD. Respiratory: Normal respiratory effort. Lungs are clear to auscultation bilaterally.  Gastrointestinal: Soft, diffusely tender to palpation worse in the epigastric region with no rebound or guard  Musculoskeletal:  No edema, cyanosis, or erythema of extremities. Neurologic: Normal speech and language. Face is symmetric. Moving all extremities. No gross focal neurologic deficits are appreciated. Skin: Skin is warm, dry and  intact. No rash noted. Psychiatric: Mood and affect are normal. Speech and behavior are normal.  ____________________________________________   LABS (all labs ordered are listed, but only abnormal results are displayed)  Labs Reviewed  LIPASE, BLOOD - Abnormal; Notable for the following components:      Result Value   Lipase 6,969 (*)    All other components within normal limits  COMPREHENSIVE METABOLIC PANEL - Abnormal; Notable for the following components:   BUN 25 (*)    Albumin 3.4 (*)    AST 167 (*)    ALT 56 (*)    Total Bilirubin 1.6 (*)    All other components within normal limits  CBC - Abnormal; Notable for the following components:   WBC 11.4 (*)    RBC 3.47 (*)    Hemoglobin 10.4 (*)    HCT 32.4 (*)    RDW 15.9 (*)    Platelets 142 (*)    All other components within normal limits  URINALYSIS, COMPLETE (UACMP) WITH MICROSCOPIC  TROPONIN I (HIGH SENSITIVITY)   ____________________________________________  EKG  ED ECG REPORT I, Rudene Re, the attending physician, personally viewed and interpreted this ECG.  A. fib with a rate of 105, no ST elevations, T wave inversions in lateral leads.  Unchanged from prior ____________________________________________  RADIOLOGY  I have personally reviewed the images performed during this visit and I agree with the Radiologist's read.   Interpretation by Radiologist:  US ABDOMEN LIMITED RUQ (LIVER/GB)  Result Date: 02/19/2021 CLINICAL DATA:  Pancreatitis. EXAM: ULTRASOUND ABDOMEN LIMITED  RIGHT UPPER QUADRANT COMPARISON:  Ultrasound 12/25/2020.  CT 12/25/2020. FINDINGS: Gallbladder: Multiple gallstones noted. 4 mm stone noted in the gallbladder neck. Gallbladder slightly distended. Gallbladder wall thickness normal. Negative Murphy sign. Small amount of free fluid noted about the gallbladder. Common bile duct: Diameter: 6.6 mm. Multiple tiny gallstones measuring up to 5 mm gallstones noted in the common bile duct. Liver: No focal lesion identified. Within normal limits in parenchymal echogenicity. Portal vein is patent on color Doppler imaging with normal direction of blood flow towards the liver. Other: None. IMPRESSION: 1. Multiple gallstones noted. 4 mm stone noted in the gallbladder neck. Gallbladder slightly distended. Gallbladder wall thickness normal. Negative Murphy sign. Small amount of fluid noted about the gallbladder. 2. Multiple tiny gallstones noted in the common bile duct. Common bile duct is normal in caliber 6.6 mm. Electronically Signed   By: Marcello Moores  Register   On: 02/19/2021 06:17      ____________________________________________   PROCEDURES  Procedure(s) performed:yes .1-3 Lead EKG Interpretation Performed by: Rudene Re, MD Authorized by: Rudene Re, MD     Interpretation: abnormal     ECG rate assessment: tachycardic     Rhythm: atrial fibrillation     Ectopy: none     Conduction: abnormal     Critical Care performed:  None ____________________________________________   INITIAL IMPRESSION / ASSESSMENT AND PLAN / ED COURSE   87 y.o. female with a history of cholelithiasis, pancreatitis, diabetes, A. fib who presents for evaluation of abdominal pain.  Patient is tender to palpation diffusely worse in the epigastric region with no rebound or guarding.  Differential diagnoses including pancreatitis versus gallbladder pathology versus peptic ulcer disease versus gastritis versus SBO versus volvulus versus diverticulitis versus ACS versus  AAA versus dissection versus kidney stone versus UTI versus pyelonephritis.  We will treat symptoms with IV fentanyl and Zofran.  We will get basic labs and imaging.  EKG showing A. fib unchanged from baseline  with rate of 105.  Patient placed on telemetry for monitoring of cardiorespiratory status.  Old medical records review including notes from patient's most recent admission in April 2022 for gallstone pancreatitis.  _________________________ 6:21 AM on 02/19/2021 -----------------------------------------  Work-up consistent with recurrent of patient's gallstone pancreatitis with lipase of 6969.  AST of 167, ALT of 56, T bili of 1.6.  Right upper quadrant ultrasound showing cholelithiasis with no evidence of cholecystitis.  Patient's pain is well controlled.  Will admit to the hospitalist service     _____________________________________________ Please note:  Patient was evaluated in Emergency Department today for the symptoms described in the history of present illness. Patient was evaluated in the context of the global COVID-19 pandemic, which necessitated consideration that the patient might be at risk for infection with the SARS-CoV-2 virus that causes COVID-19. Institutional protocols and algorithms that pertain to the evaluation of patients at risk for COVID-19 are in a state of rapid change based on information released by regulatory bodies including the CDC and federal and state organizations. These policies and algorithms were followed during the patient's care in the ED.  Some ED evaluations and interventions may be delayed as a result of limited staffing during the pandemic.   [place] Controlled Substance Database was reviewed by me. ____________________________________________   FINAL CLINICAL IMPRESSION(S) / ED DIAGNOSES   Final diagnoses:  Pancreatitis  Acute gallstone pancreatitis      NEW MEDICATIONS STARTED DURING THIS VISIT:  ED Discharge Orders    None        Note:  This document was prepared using Dragon voice recognition software and may include unintentional dictation errors.    Alfred Levins, [place], MD 02/19/21 [phone]

## 2021-02-19 NOTE — ED Notes (Signed)
Lab called to check on status of results. Per lab, troponin not initially run and will be completed at this time. Delay in processing CMP.

## 2021-02-19 NOTE — ED Notes (Signed)
Pt reports abd pain with vomiting tonight.  No chest pain or sob.  No cough.  afib on moniotr. Iv started and labs sent.

## 2021-02-19 NOTE — Consult Note (Signed)
GI Inpatient Consult Note  Reason for Consult: Acute gallstone pancreatitis    Attending Requesting Consult: Dr. Royce Macadamia Agbata  History of Present Illness: Holly Rose is a 87 y.o. female seen for evaluation of acute gallstone pancreatitis at the request of Dr. Francine Graven. Pt has a PMH of early dementia, T2DM, Hx of breast cancer, Hx of CVA, permanent atrial fibrillation on Eliquis, s/p pacemaker placement, and CAD. She presented to the [facility] ED early this morning for acute abdominal pain in her epigastrium and RUQ with associated NBNB episodes of emesis. Symptom onset started after she ate dinner and persisted so EMS was called. Upon presentation to the ED, labs showed WBC 11.4K, hgb 10.4, Na 136, K 4.2, BUN 25, Cr 0.7, AP 47, lipase 6969, AST 167, ALT 56, and tbili 1.6. Gallbladder ultrasound showed multiple gallstones, 4 mm stone in gallbladder neck, slightly distended gallbladder, negative Murphy sign, and multiple tiny gallstones noted in CBD duct normal diameter of CBD 6.8m. GI was consulted for further management.   Patient seen and examined this afternoon resting comfortably. Of note, [facility] She was seen by Dr. TAlice Reichertand myself during this hospitalization as consult. Conservative management was advised at this time due to advanced age, cognitive deficits, and not felt to be ideal candidate for cholecystectomy unless she were to have a recurrence. She was actually seen in the clinic by Dr. BBary Castillayesterday for concerns over enlarging mass on left side of her scalp with concerns for possible SCC. She was scheduled for excision on 06/20. She reports she went home yesterday and had chicken and dumplings for dinner and symptoms started about 1-2 hours after eating. She is feeling better now and denies being in any pain. She has had no further emesis since being here in the hospital.   Past Medical History:  Past Medical History:   Diagnosis Date  . Allergy   . Anemia   . Arthritis   . Atrial fibrillation ([place] 2004  . Breast cancer, left ([place] 10/2017   Lumpectomy and rad tx's.   .Marland KitchenCAD (coronary artery disease)   . Complication of anesthesia    hard to wake up with general anesthesia  . Diabetes ([place]   . Dysrhythmia    A-fib  . Heart attack ([place] 2008  . Heart murmur   . History of kidney stones   . History of sick sinus syndrome    s/p pacemaker placement  . Hyperlipidemia   . Macular degeneration   . Neuropathy   . Neuropathy   . Pacemaker 06/2010  . Personal history of radiation therapy 11/2017   LEFT lumpectomy   . Squamous cell skin cancer   . TIA (transient ischemic attack)     Problem List: Patient Active Problem List   Diagnosis Date Noted  . Acute pancreatitis 02/19/2021  . CKD stage 3 due to type 2 diabetes mellitus ([place]   . Cholelithiasis   . Osteoporosis 01/22/2021  . Pancreatitis, acute 12/25/2020  . Pancreatitis 12/25/2020  . Dementia without behavioral disturbance ([place]   . Stroke ([place] 07/22/2020  . TIA (transient ischemic attack) 07/22/2020  . Depression 07/22/2020  . Visual hallucinations 07/21/2020  . NSTEMI (non-ST elevated myocardial infarction) ([place] 04/17/2020  . Change in mental status 04/15/2020  . Head injury 04/15/2020  . Dysuria 04/30/2019  . Frequent urinary tract infections 04/30/2019  . Age-related osteoporosis without current pathological fracture 03/01/2019  . Dizziness 08/29/2018  .  UTI (urinary tract infection) 08/15/2018  . Vitamin D deficiency, unspecified 06/16/2017  . Hypercalcinuria 06/14/2017  . SCC (squamous cell carcinoma), face 03/29/2017  . History of kidney stones 03/12/2017  . Breast cancer, left ([place]) 12/28/2016  . Chronic cystitis 11/05/2016  . Nephrolithiasis 11/05/2016  . Renal colic 83/29/1916  . Urge incontinence 11/05/2016  . Bilateral hand pain 11/03/2016  . Generalized osteoarthritis of hand 11/03/2016  . Numbness and  tingling in both hands 11/03/2016  . Bradycardia 04/15/2016  . History of colonic polyps 09/16/2015  . Abdominal pain 08/18/2015  . Rectal bleeding 08/18/2015  . CAD (coronary artery disease) 02/17/2015  . Atrial fibrillation ([place]) 02/17/2015  . Diabetes mellitus with neuropathy ([place]) 02/17/2015  . Hyperlipidemia 02/17/2015  . Iron deficiency anemia 02/17/2015  . Macular degeneration 02/17/2015  . Stress 02/17/2015  . Health care maintenance 02/17/2015  . Diarrhea 02/17/2015  . Type II diabetes mellitus ([place]) 07/13/2014  . Diverticulosis 03/31/2013  . GI bleeding 03/31/2013  . Mitral valve prolapse 03/31/2013  . Diverticulosis of intestine without perforation or abscess without bleeding 03/31/2013    Past Surgical History: Past Surgical History:  Procedure Laterality Date  . ABDOMINAL HYSTERECTOMY  1960's  . APPENDECTOMY  1947  . [place]  . BREAST BIOPSY Left 12/17/2016   SINGLE FRAGMENT OF ATYPICAL EPITHELIAL CELLS  . BREAST BIOPSY Left 04/08/2017   [facility]  . BREAST EXCISIONAL BIOPSY Left 01/18/1986   Benign microcalcifications, [employer].  Marland Kitchen BREAST LUMPECTOMY Left 05/26/2017   13 mm,T1c, N0; ER/ PR+, Her 2 neu negative, HIGH RISK by Mammoprint ;  Surgeon: [name], MD;  Location: [facility] ORS;  Service: General;  Laterality: Left;  . BREAST SURGERY  1988  . CARDIAC CATHETERIZATION  1989  . CATARACT EXTRACTION  1997 and 1998  . COLONOSCOPY WITH PROPOFOL N/A 09/12/2015   Procedure: COLONOSCOPY WITH PROPOFOL;  Surgeon: [name], MD;  Location: [facility] ENDOSCOPY;  Service: Endoscopy;  Laterality: N/A;  . IMPLANTABLE CARDIOVERTER DEFIBRILLATOR (ICD) GENERATOR CHANGE Left 03/29/2019   Procedure: PACEMAKER CHANGE OUT;  Surgeon: [name], MD;  Location: [facility] ORS;  Service: Cardiovascular;  Laterality: Left;  . KIDNEY STONE SURGERY  2018  . MOHS SURGERY  2019   forehead  . pace maker  2008  . [place]  .  TONSILLECTOMY AND ADENOIDECTOMY  1950"s    Allergies: Allergies  Allergen Reactions  . Lyrica [Pregabalin] Swelling    Sleepy, does not eat  . Neurontin [Gabapentin] Nausea And Vomiting and Other (See Comments)    disoriented  . Bactrim [Sulfamethoxazole-Trimethoprim]     Made her dizziness    Home Medications: Medications Prior to Admission  Medication Sig Dispense Refill Last Dose  . acetaminophen (TYLENOL) 325 MG tablet Take 2 tablets (650 mg total) by mouth every 6 (six) hours as needed for mild pain (or Fever >/= 101).   prn at prn  . Aflibercept 2 MG/0.05ML SOLN 1 each by Intravitreal route every 8 (eight) weeks.     Marland Kitchen apixaban (ELIQUIS) 2.5 MG TABS tablet Take 1 tablet (2.5 mg total) by mouth 2 (two) times daily. 60 tablet 1 02/18/2021 at 1800  . atorvastatin (LIPITOR) 40 MG tablet Take 1 tablet (40 mg total) by mouth daily. 30 tablet 0 02/18/2021 at Unknown time  . Biotin w/ Vitamins C & E (HAIR/SKIN/NAILS PO) Take 3 tablets by mouth daily. 2 in the am, 1 in the pm   02/18/2021  at Unknown time  . Cholecalciferol (VITAMIN D) 50 MCG (2000 UT) CAPS Take 4,000 Units by mouth daily.   02/18/2021 at Unknown time  . denosumab (PROLIA) 60 MG/ML SOSY injection Inject 60 mg into the skin every 6 (six) months.   02/03/2021  . divalproex (DEPAKOTE ER) 250 MG 24 hr tablet Take 250 mg by mouth daily.   02/18/2021 at Unknown time  . furosemide (LASIX) 20 MG tablet Take 1 tablet (20 mg total) by mouth daily.   02/18/2021 at Unknown time  . isosorbide mononitrate (IMDUR) 30 MG 24 hr tablet Take 1 tablet (30 mg total) by mouth daily. 30 tablet 0 02/18/2021 at Unknown time  . Magnesium 200 MG TABS Take 200 mg by mouth 2 (two) times a day.   02/18/2021 at Unknown time  . meclizine (ANTIVERT) 12.5 MG tablet Take 1 tablet (12.5 mg total) by mouth 2 (two) times daily as needed for dizziness. 14 tablet 0 prn at prn  . metFORMIN (GLUCOPHAGE-XR) 500 MG 24 hr tablet Take 1 tablet by mouth twice daily 90 tablet 0 02/18/2021 at  Unknown time  . metoprolol tartrate (LOPRESSOR) 25 MG tablet Take 25 mg by mouth 2 (two) times daily.   02/18/2021 at 1800  . mirtazapine (REMERON) 15 MG tablet Take 15 mg by mouth at bedtime.   02/18/2021 at Unknown time  . Multiple Vitamins-Minerals (PRESERVISION AREDS 2 PO) Take 1 tablet by mouth 2 (two) times daily.    02/18/2021 at Unknown time  . mupirocin ointment (BACTROBAN) 2 % Apply 1 application topically daily. 30 g 1 prn at prn  . OVER THE COUNTER MEDICATION Take 2 tablets by mouth in the morning and at bedtime. Energizing iron tablets   02/18/2021 at Unknown time  . Polyethyl Glycol-Propyl Glycol (SYSTANE OP) Apply 1 drop to eye 2 (two) times daily.   02/18/2021 at Unknown time  . Probiotic Product (PROBIOTIC DAILY PO) Take by mouth.   02/18/2021 at Unknown time  . tamoxifen (NOLVADEX) 20 MG tablet Take 1 tablet (20 mg total) by mouth daily. 90 tablet 3 02/18/2021 at Unknown time  . vitamin B-12 (CYANOCOBALAMIN) 500 MCG tablet Take 500 mcg by mouth 2 (two) times a day.   02/18/2021 at Unknown time  . blood glucose meter kit and supplies Per patient please dispense One touch Ultra2 meter.Use meter and supplies three times a day to check blood sugar. ICD10: E11.40 1 each 0   . Blood Glucose Monitoring Suppl (FREESTYLE LITE) DEVI 2 (two) times daily.     . divalproex (DEPAKOTE) 125 MG DR tablet Take 1 tablet (125 mg total) by mouth at bedtime. (Patient not taking: Reported on 02/19/2021)   Not Taking at Unknown time  . glucose blood (FREESTYLE LITE) test strip Used to check blood sugars twice a day. DX E11.9 100 each 12   . Lancets (FREESTYLE) lancets 2 (two) times daily.      Home medication reconciliation was completed with the patient.   Scheduled Inpatient Medications:   . cholecalciferol  4,000 Units Oral Daily  . divalproex  250 mg Oral Daily  . isosorbide mononitrate  30 mg Oral Daily  . magnesium oxide  200 mg Oral BID  . metoprolol tartrate  25 mg Oral BID  . mirtazapine  15 mg Oral QHS   . multivitamin-lutein  1 capsule Oral BID  . pantoprazole (PROTONIX) IV  40 mg Intravenous Q24H  . tamoxifen  20 mg Oral Daily  . vitamin B-12  500  mcg Oral BID    Continuous Inpatient Infusions:   . sodium chloride 75 mL/hr at 02/19/21 1508    PRN Inpatient Medications:  acetaminophen, morphine injection, ondansetron **OR** ondansetron (ZOFRAN) IV  Family History: family history includes Arthritis in her maternal grandmother; Breast cancer in her maternal aunt; Cancer in her maternal aunt, maternal aunt, and son; Diabetes in her mother and son; Stroke in her mother.  The patient's family history is negative for inflammatory bowel disorders, GI malignancy, or solid organ transplantation.  Social History:   reports that she quit smoking about 52 years ago. She quit after 5.00 years of use. She has never used smokeless tobacco. She reports that she does not drink alcohol and does not use drugs. The patient denies ETOH, tobacco, or drug use.   Review of Systems:  Somewhat confused, but able to answer most ROS questions. See HPI   Physical Examination: BP (!) 106/51 (BP Location: Left Arm)   Pulse 79   Temp 97.7 F (36.5 C) (Oral)   Resp 16   Wt 52.1 kg   LMP  (LMP Unknown)   SpO2 96%   BMI 20.36 kg/m   Elderly appearing female in ED stretcher. Answers all questions appropriately. No family in room.  Gen: NAD, alert and oriented x 4 HEENT: PEERLA, EOMI, Neck: supple, no JVD or thyromegaly Chest: CTA bilaterally, no wheezes, crackles, or other adventitious sounds CV: RRR, no m/g/c/r Abd: soft, nondistended, hypoactive BS in all four quadrants; tender to deep palpation in RUQ, no HSM, guarding, ridigity, or rebound tenderness Ext: no edema, well perfused with 2+ pulses, Skin: no rash or lesions noted Lymph: no LAD  Data: Lab Results  Component Value Date   WBC 11.4 (H) 02/19/2021   HGB 10.4 (L) 02/19/2021   HCT 32.4 (L) 02/19/2021   MCV 93.4 02/19/2021   PLT 142 (L)  02/19/2021   Recent Labs  Lab 02/19/21 0143  HGB 10.4*   Lab Results  Component Value Date   NA 136 02/19/2021   K 4.2 02/19/2021   CL 103 02/19/2021   CO2 25 02/19/2021   BUN 25 (H) 02/19/2021   CREATININE 0.77 02/19/2021   GLU 164 12/01/2014   Lab Results  Component Value Date   ALT 56 (H) 02/19/2021   AST 167 (H) 02/19/2021   ALKPHOS 47 02/19/2021   BILITOT 1.6 (H) 02/19/2021   No results for input(s): APTT, INR, PTT in the last 168 hours.   RUQ [place] 02/19/2021: IMPRESSION: 1. Multiple gallstones noted. 4 mm stone noted in the gallbladder neck. Gallbladder slightly distended. Gallbladder wall thickness normal. Negative Murphy sign. Small amount of fluid noted about the gallbladder.  2. Multiple tiny gallstones noted in the common bile duct. Common bile duct is normal in caliber 6.6 mm.  Assessment/Plan:  87 y/o Caucasian female with a PMH of early dementia, T2DM, Hx of breast cancer, Hx of CVA, permanent atrial fibrillation on Eliquis, s/p pacemaker placement, and CAD presented to the [facility] ED this morning for  1. Acute, recurrent gallstone pancreatitis with radiographic evidence of choledocholithiasis (normal CBD diameter 6.6 mm) - previous hospitalization two months ago for acute gallstone pancreatitis which resolved with conservative treatment. No surgical intervention was advised at that time. Pt currently admitted with another episode of acute gallstone pancreatitis with evidence of multiple tiny gallstones in the CBD.   2. Cholelithiasis without evidence of cholecystitis   3. Transaminitis - 2/2 #1  4. History of atrial fibrillation - holding home  Eliquis  5. Dementia  6. DNR status   Recommendations:  - Advise conservative treatment of pancreatitis with NPO status, IV fluid hydration, antiemetics as needed, and pain control - Keep NPO for now. Likely can start clear liquids tomorrow.  - Serial abdominal examinations  - Due to findings of  choledocholithiasis, will need ERCP for stone extraction and biliary sphincterotomy  - Agree with General Surgery consultation for recs regarding need for cholecystectomy after ERCP to prevent further episodes - Following along with you - ERCP when clinically feasibly, after resolution of pancreatitis    Thank you for the consult. Please call with questions or concerns.  Reeves Forth [facility] [phone] (Cell)

## 2021-02-19 NOTE — ED Notes (Signed)
Pain meds given.   Family with pt.   

## 2021-02-19 NOTE — ED Notes (Signed)
Lab called and needed another green top sent. Sent to lab at this time. MD and family aware of delay.

## 2021-02-19 NOTE — H&P (Signed)
History and Physical    EXA BOMBA LAG:[id number] DOB: 1930-04-29 DOA: 02/19/2021  PCP: [name], MD   Patient coming from: Home  I have personally briefly reviewed patient's old medical records in [place]  Chief Complaint: Abdominal pain Most of the history was obtained from her daughter-in-law who was at the bedside as well as ER notes.  HPI: Holly Rose is a 87 y.o. female with medical history significant for early dementia, type 2 diabetes mellitus, breast cancer, CVA, cholelithiasis, history of gallstone pancreatitis and A. fib who presented to the ED for evaluation of abdominal pain which started the evening prior to her presentation to the emergency room.  Abdominal pain is mostly in the epigastrium and right upper quadrant and is described as a sharp, severe, constant pain associated with nausea and multiple episodes of nonbilious, nonbloody emesis.  Symptoms started after dinner and have been persistent since. Patient states that her pain is similar to her most recent flare of gallstone pancreatitis.  Her daughter-in-law states that she has had a progressive decline as well as significant weight loss. I am unable to do review of systems on this patient due to her history of dementia. Labs show sodium 136, potassium 4.2, chloride 103, bicarb 25, glucose 99, BUN 25, creatinine 0.7, calcium 9.5, albumin 3.4, alkaline phosphatase 47, lipase 6969, AST 167, ALT 56, total protein 7.6, troponin 7, white count 11.4, hemoglobin 10.4, hematocrit 32.4, MCV 93.4, RDW 15.9, platelet count 142 Respiratory viral panel is pending Gallbladder ultrasound shows multiple gallstones noted. 4 mm stone noted in the gallbladderneck. Gallbladder slightly distended. Gallbladder wall thickness normal. Negative Murphy sign. Small amount of fluid noted about the gallbladder. Multiple tiny gallstones noted in the common bile duct. Common bile duct is normal in caliber 6.6 mm. Twelve-lead  EKG reviewed by me shows A. fib with PVCs.   ED Course: Patient is an 87 year old Caucasian female with a known history of cholelithiasis who presents to the ER for evaluation of abdominal pain consistent with her prior flares of gallstone pancreatitis.  She is noted to have significantly elevated lipase levels as well as transaminitis. Gallbladder ultrasound shows multiple gallstones and a 4 mm stone in the gallbladder neck. She will be admitted to the hospital for further evaluation.   Review of Systems: As per HPI otherwise all other systems reviewed and negative.    Past Medical History:  Diagnosis Date  . Allergy   . Anemia   . Arthritis   . Atrial fibrillation ([place]) 2004  . Breast cancer, left ([place]) 10/2017   Lumpectomy and rad tx's.   Marland Kitchen CAD (coronary artery disease)   . Complication of anesthesia    hard to wake up with general anesthesia  . Diabetes ([place])   . Dysrhythmia    A-fib  . Heart attack ([place]) 2008  . Heart murmur   . History of kidney stones   . History of sick sinus syndrome    s/p pacemaker placement  . Hyperlipidemia   . Macular degeneration   . Neuropathy   . Neuropathy   . Pacemaker 06/2010  . Personal history of radiation therapy 11/2017   LEFT lumpectomy   . Squamous cell skin cancer   . TIA (transient ischemic attack)     Past Surgical History:  Procedure Laterality Date  . ABDOMINAL HYSTERECTOMY  1960's  . APPENDECTOMY  1947  . [place]  . BREAST BIOPSY Left 12/17/2016  SINGLE FRAGMENT OF ATYPICAL EPITHELIAL CELLS  . BREAST BIOPSY Left 04/08/2017   [facility]  . BREAST EXCISIONAL BIOPSY Left 01/18/1986   Benign microcalcifications, [employer].  Marland Kitchen BREAST LUMPECTOMY Left 05/26/2017   13 mm,T1c, N0; ER/ PR+, Her 2 neu negative, HIGH RISK by [facility] ;  Surgeon: [name], MD;  Location: [facility] ORS;  Service: General;  Laterality: Left;  . BREAST SURGERY  1988  . CARDIAC CATHETERIZATION  1989  . CATARACT  EXTRACTION  1997 and 1998  . COLONOSCOPY WITH PROPOFOL N/A 09/12/2015   Procedure: COLONOSCOPY WITH PROPOFOL;  Surgeon: [name], MD;  Location: [facility];  Service: Endoscopy;  Laterality: N/A;  . IMPLANTABLE CARDIOVERTER DEFIBRILLATOR (ICD) GENERATOR CHANGE Left 03/29/2019   Procedure: PACEMAKER CHANGE OUT;  Surgeon: [name], MD;  Location: [facility] ORS;  Service: Cardiovascular;  Laterality: Left;  . KIDNEY STONE SURGERY  2018  . MOHS SURGERY  2019   forehead  . pace maker  2008  . [place]  . TONSILLECTOMY AND ADENOIDECTOMY  1950"s     reports that she quit smoking about 52 years ago. She quit after 5.00 years of use. She has never used smokeless tobacco. She reports that she does not drink alcohol and does not use drugs.  Allergies  Allergen Reactions  . Lyrica [Pregabalin] Swelling    Sleepy, does not eat  . Neurontin [Gabapentin] Nausea And Vomiting and Other (See Comments)    disoriented  . Bactrim [Sulfamethoxazole-Trimethoprim]     Made her dizziness    Family History  Problem Relation Age of Onset  . Stroke Mother   . Diabetes Mother   . Cancer Maternal Aunt        Breast Cancer  . Breast cancer Maternal Aunt   . Arthritis Maternal Grandmother   . Cancer Maternal Aunt        Lung Cancer  . Diabetes Son   . Cancer Son   . Kidney disease Neg Hx   . Bladder Cancer Neg Hx       Prior to Admission medications   Medication Sig Start Date End Date Taking? Authorizing Provider  acetaminophen (TYLENOL) 325 MG tablet Take 2 tablets (650 mg total) by mouth every 6 (six) hours as needed for mild pain (or Fever >/= 101). 12/28/20  Yes Domenic Polite, MD  Aflibercept 2 MG/0.05ML SOLN 1 each by Intravitreal route every 8 (eight) weeks.   Yes [provider]  apixaban (ELIQUIS) 2.5 MG TABS tablet Take 1 tablet (2.5 mg total) by mouth 2 (two) times daily. 07/23/20  Yes Lorella Nimrod, MD  atorvastatin (LIPITOR) 40 MG tablet Take 1  tablet (40 mg total) by mouth daily. 04/18/20  Yes Wieting, Richard, MD  Biotin w/ Vitamins C & E (HAIR/SKIN/NAILS PO) Take 3 tablets by mouth daily. 2 in the am, 1 in the pm   Yes [provider]  Cholecalciferol (VITAMIN D) 50 MCG (2000 UT) CAPS Take 4,000 Units by mouth daily.   Yes [provider]  denosumab (PROLIA) 60 MG/ML SOSY injection Inject 60 mg into the skin every 6 (six) months.   Yes [provider]  divalproex (DEPAKOTE ER) 250 MG 24 hr tablet Take 250 mg by mouth daily. 01/30/21  Yes [provider]  furosemide (LASIX) 20 MG tablet Take 1 tablet (20 mg total) by mouth daily. 12/28/20  Yes Domenic Polite, MD  isosorbide mononitrate (IMDUR) 30 MG 24 hr tablet Take 1 tablet (30 mg total)  by mouth daily. 04/18/20  Yes Wieting, Richard, MD  Magnesium 200 MG TABS Take 200 mg by mouth 2 (two) times a day.   Yes [provider]  meclizine (ANTIVERT) 12.5 MG tablet Take 1 tablet (12.5 mg total) by mouth 2 (two) times daily as needed for dizziness. 02/07/20  Yes [name], MD  metFORMIN (GLUCOPHAGE-XR) 500 MG 24 hr tablet Take 1 tablet by mouth twice daily 01/16/21  Yes [name], MD  metoprolol tartrate (LOPRESSOR) 25 MG tablet Take 25 mg by mouth 2 (two) times daily. 06/21/20  Yes [provider]  mirtazapine (REMERON) 15 MG tablet Take 15 mg by mouth at bedtime. 11/17/20  Yes [provider]  Multiple Vitamins-Minerals (PRESERVISION AREDS 2 PO) Take 1 tablet by mouth 2 (two) times daily.    Yes [provider]  mupirocin ointment (BACTROBAN) 2 % Apply 1 application topically daily. 10/21/20  Yes McDonald, Stephan Minister, DPM  OVER THE COUNTER MEDICATION Take 2 tablets by mouth in the morning and at bedtime. Energizing iron tablets   Yes [provider]  Polyethyl Glycol-Propyl Glycol (SYSTANE OP) Apply 1 drop to eye 2 (two) times daily.   Yes [provider]  Probiotic Product (PROBIOTIC DAILY PO) Take by  mouth.   Yes [provider]  tamoxifen (NOLVADEX) 20 MG tablet Take 1 tablet (20 mg total) by mouth daily. 10/23/20  Yes [name], MD  vitamin B-12 (CYANOCOBALAMIN) 500 MCG tablet Take 500 mcg by mouth 2 (two) times a day.   Yes [provider]  blood glucose meter kit and supplies Per patient please dispense One touch Ultra2 meter.Use meter and supplies three times a day to check blood sugar. ICD10: E11.40 07/25/15   [name], MD  Blood Glucose Monitoring Suppl (FREESTYLE LITE) DEVI 2 (two) times daily. 03/04/20   [provider]  divalproex (DEPAKOTE) 125 MG DR tablet Take 1 tablet (125 mg total) by mouth at bedtime. Patient not taking: Reported on 02/19/2021 12/28/20   Domenic Polite, MD  glucose blood (FREESTYLE LITE) test strip Used to check blood sugars twice a day. DX E11.9 11/08/20   [name], MD  Lancets (FREESTYLE) lancets 2 (two) times daily. 03/02/20   [provider]    Physical Exam: Vitals:   02/19/21 0330 02/19/21 0400 02/19/21 0500 02/19/21 0600  BP: 134/69 128/73 130/72 129/70  Pulse: 91 91 86 77  Resp: $Remo'15 16 15 14  'BsKWL$ Temp:      TempSrc:      SpO2: 97% 98% 95% 97%  Weight:         Vitals:   02/19/21 0330 02/19/21 0400 02/19/21 0500 02/19/21 0600  BP: 134/69 128/73 130/72 129/70  Pulse: 91 91 86 77  Resp: $Remo'15 16 15 14  'aKkdr$ Temp:      TempSrc:      SpO2: 97% 98% 95% 97%  Weight:          Constitutional: Alert and oriented x 1.  To person, not to place or time. Not in any apparent distress HEENT:      Head: Normocephalic and atraumatic.         Eyes: PERLA, EOMI, Conjunctivae are normal. Sclera is non-icteric.       Mouth/Throat: Mucous membranes are dry.       Neck: Supple with no signs of meningismus. Cardiovascular:  Irregularly irregular. No murmurs, gallops, or rubs. 2+ symmetrical distal pulses are present . No JVD. No LE edema Respiratory: Respiratory effort  normal .Lungs sounds clear bilaterally. No  wheezes, crackles, or rhonchi.  Gastrointestinal: Soft, tender in the epigastrium and right upper quadrant, and non distended with positive bowel sounds.  Genitourinary: No CVA tenderness. Musculoskeletal: Nontender with normal range of motion in all extremities. No cyanosis, or erythema of extremities. Neurologic:  Face is symmetric. Moving all extremities. No gross focal neurologic deficits . Skin: Skin is warm, dry.  No rash or ulcers Psychiatric: Mood and affect are normal   Labs on Admission: I have personally reviewed following labs and imaging studies  CBC: Recent Labs  Lab 02/19/21 0143  WBC 11.4*  HGB 10.4*  HCT 32.4*  MCV 93.4  PLT 595*   Basic Metabolic Panel: Recent Labs  Lab 02/19/21 0143  NA 136  K 4.2  CL 103  CO2 25  GLUCOSE 99  BUN 25*  CREATININE 0.77  CALCIUM 9.5   GFR: Estimated Creatinine Clearance: 39.4 mL/min (by C-G formula based on SCr of 0.77 mg/dL). Liver Function Tests: Recent Labs  Lab 02/19/21 0143  AST 167*  ALT 56*  ALKPHOS 47  BILITOT 1.6*  PROT 7.6  ALBUMIN 3.4*   Recent Labs  Lab 02/19/21 0143  LIPASE 6,969*   No results for input(s): AMMONIA in the last 168 hours. Coagulation Profile: No results for input(s): INR, PROTIME in the last 168 hours. Cardiac Enzymes: No results for input(s): CKTOTAL, CKMB, CKMBINDEX, TROPONINI in the last 168 hours. BNP (last 3 results) No results for input(s): PROBNP in the last 8760 hours. HbA1C: No results for input(s): HGBA1C in the last 72 hours. CBG: No results for input(s): GLUCAP in the last 168 hours. Lipid Profile: No results for input(s): CHOL, HDL, LDLCALC, TRIG, CHOLHDL, LDLDIRECT in the last 72 hours. Thyroid Function Tests: No results for input(s): TSH, T4TOTAL, FREET4, T3FREE, THYROIDAB in the last 72 hours. Anemia Panel: No results for input(s): VITAMINB12, FOLATE, FERRITIN, TIBC, IRON, RETICCTPCT in the last 72 hours. Urine analysis:    Component Value Date/Time    COLORURINE YELLOW 01/28/2021 1454   APPEARANCEUR Sl Cloudy (A) 01/28/2021 1454   APPEARANCEUR Clear 06/07/2019 0940   LABSPEC 1.020 01/28/2021 1454   PHURINE 5.5 01/28/2021 1454   GLUCOSEU NEGATIVE 01/28/2021 1454   HGBUR NEGATIVE 01/28/2021 [place] 01/28/2021 1454   BILIRUBINUR Negative 06/07/2019 0940   KETONESUR NEGATIVE 01/28/2021 1454   PROTEINUR NEGATIVE 12/25/2020 1605   UROBILINOGEN 0.2 01/28/2021 1454   NITRITE POSITIVE (A) 01/28/2021 1454   LEUKOCYTESUR SMALL (A) 01/28/2021 1454    Radiological Exams on Admission: US ABDOMEN LIMITED RUQ (LIVER/GB)  Result Date: 02/19/2021 CLINICAL DATA:  Pancreatitis. EXAM: ULTRASOUND ABDOMEN LIMITED RIGHT UPPER QUADRANT COMPARISON:  Ultrasound 12/25/2020.  CT 12/25/2020. FINDINGS: Gallbladder: Multiple gallstones noted. 4 mm stone noted in the gallbladder neck. Gallbladder slightly distended. Gallbladder wall thickness normal. Negative Murphy sign. Small amount of free fluid noted about the gallbladder. Common bile duct: Diameter: 6.6 mm. Multiple tiny gallstones measuring up to 5 mm gallstones noted in the common bile duct. Liver: No focal lesion identified. Within normal limits in parenchymal echogenicity. Portal vein is patent on color Doppler imaging with normal direction of blood flow towards the liver. Other: None. IMPRESSION: 1. Multiple gallstones noted. 4 mm stone noted in the gallbladder neck. Gallbladder slightly distended. Gallbladder wall thickness normal. Negative Murphy sign. Small amount of fluid noted about the gallbladder. 2. Multiple tiny gallstones noted in the common bile duct. Common bile duct is normal in caliber 6.6 mm. Electronically Signed  By: [place]   On: 02/19/2021 06:17     Assessment/Plan Principal Problem:   Acute pancreatitis Active Problems:   CAD (coronary artery disease)   Atrial fibrillation (HCC)   Breast cancer, left (H[facility])   Depression   Dementia without behavioral  disturbance ([place])   CKD stage 3 due to type 2 diabetes mellitus ([place])   Cholelithiasis     Acute gallstone pancreatitis Recurrent Gallbladder ultrasound shows multiple gallstones noted. 4 mm stone noted in the gallbladder neck. Gallbladder slightly distended. Gallbladder wall thickness normal. Negative Murphy sign. Small amount of fluid noted about the gallbladder.Multiple tiny gallstones noted in the common bile duct. Common bile duct is normal in caliber 6.6 mm. We will keep patient n.p.o. for now, IV fluid hydration, pain control, IV PPI and antiemetics We will request surgical consult     History of atrial fibrillation Continue metoprolol for rate control Hold apixaban which patient is on for secondary prophylaxis for an acute stroke until patient is seen and evaluated by surgery    Diabetes mellitus with complications of stage III chronic kidney disease Hold metformin Keep patient n.p.o. for now Blood sugar checks every 4 hours    Coronary artery disease Continue metoprolol and nitrates Hold statins due to transaminitis   History of breast cancer Continue tamoxifen   Depression Continue Remeron   DVT prophylaxis: SCD Code Status: DO NOT RESUSCITATE Family Communication: Greater than 50% of time was spent discussing patient's condition and plan of care with her healthcare power of attorney, Monserratt Knezevic who was at the bedside.  All questions and concerns have been addressed.  She verbalizes understanding and agrees with the plan.  CODE STATUS was discussed and patient is a DNR. Disposition Plan: Back to previous home environment Consults called: Surgery Status: At the time of admission, it appears that the appropriate admission status for this patient is inpatient. This is judged to be reasonable and necessary in order to provide the required intensity of service to ensure the patient's safety given the presenting symptoms, physical exam findings, and initial  radiographic and laboratory data in the context of their comorbid conditions. Patient requires inpatient status due to high intensity of service, high risk of further deterioration and high frequency of surveillance required.    Collier Bullock MD [facility]     02/19/2021, 8:31 AM

## 2021-02-19 NOTE — ED Notes (Signed)
Dee RN aware of assigned bed 

## 2021-02-19 NOTE — ED Notes (Signed)
Ultrasound at bedside

## 2021-02-20 DIAGNOSIS — Z7189 Other specified counseling: Secondary | ICD-10-CM

## 2021-02-20 DIAGNOSIS — Z515 Encounter for palliative care: Secondary | ICD-10-CM

## 2021-02-20 DIAGNOSIS — F0150 Vascular dementia without behavioral disturbance: Secondary | ICD-10-CM

## 2021-02-20 LAB — HEPATIC FUNCTION PANEL
ALT: 34 U/L (ref 0–44)
AST: 48 U/L — ABNORMAL HIGH (ref 15–41)
Albumin: 2.7 g/dL — ABNORMAL LOW (ref 3.5–5.0)
Alkaline Phosphatase: 39 U/L (ref 38–126)
Bilirubin, Direct: <0.1 mg/dL (ref 0.0–0.2)
Total Bilirubin: 0.6 mg/dL (ref 0.3–1.2)
Total Protein: 6.4 g/dL — ABNORMAL LOW (ref 6.5–8.1)

## 2021-02-20 LAB — BASIC METABOLIC PANEL
Anion gap: 4 — ABNORMAL LOW (ref 5–15)
BUN: 16 mg/dL (ref 8–23)
CO2: 26 mmol/L (ref 22–32)
Calcium: 8.5 mg/dL — ABNORMAL LOW (ref 8.9–10.3)
Chloride: 106 mmol/L (ref 98–111)
Creatinine, Ser: 0.63 mg/dL (ref 0.44–1.00)
GFR, Estimated: >60 mL/min (ref >60)
Glucose, Bld: 143 mg/dL — ABNORMAL HIGH (ref 70–99)
Potassium: 3.5 mmol/L (ref 3.5–5.1)
Sodium: 136 mmol/L (ref 135–145)

## 2021-02-20 LAB — GLUCOSE, CAPILLARY
Glucose-Capillary: 117 mg/dL — ABNORMAL HIGH (ref 70–99)
Glucose-Capillary: 208 mg/dL — ABNORMAL HIGH (ref 70–99)
Glucose-Capillary: 57 mg/dL — ABNORMAL LOW (ref 70–99)
Glucose-Capillary: 58 mg/dL — ABNORMAL LOW (ref 70–99)
Glucose-Capillary: 68 mg/dL — ABNORMAL LOW (ref 70–99)
Glucose-Capillary: 76 mg/dL (ref 70–99)
Glucose-Capillary: 86 mg/dL (ref 70–99)
Glucose-Capillary: 88 mg/dL (ref 70–99)

## 2021-02-20 LAB — CBC
HCT: 33 % — ABNORMAL LOW (ref 36.0–46.0)
Hemoglobin: 10.4 g/dL — ABNORMAL LOW (ref 12.0–15.0)
MCH: 29.3 pg (ref 26.0–34.0)
MCHC: 31.5 g/dL (ref 30.0–36.0)
MCV: 93 fL (ref 80.0–100.0)
Platelets: 138 10*3/uL — ABNORMAL LOW (ref 150–400)
RBC: 3.55 MIL/uL — ABNORMAL LOW (ref 3.87–5.11)
RDW: 15.9 % — ABNORMAL HIGH (ref 11.5–15.5)
WBC: 7.6 10*3/uL (ref 4.0–10.5)
nRBC: 0 % (ref 0.0–0.2)

## 2021-02-20 LAB — LIPASE, BLOOD: Lipase: 368 U/L — ABNORMAL HIGH (ref 11–51)

## 2021-02-20 MED ORDER — DEXTROSE 50 % IV SOLN
12.5000 g | INTRAVENOUS | Status: AC
  Administered 2021-02-21: 12.5 g via INTRAVENOUS
  Filled 2021-02-20: qty 50

## 2021-02-20 MED ORDER — DEXTROSE 50 % IV SOLN
INTRAVENOUS | Status: AC
  Administered 2021-02-20: 50 mL
  Filled 2021-02-20: qty 50

## 2021-02-20 MED ORDER — SODIUM CHLORIDE 0.9 % IV SOLN: INTRAVENOUS | Status: DC

## 2021-02-20 NOTE — Consult Note (Signed)
SURGICAL CONSULTATION NOTE   HISTORY OF PRESENT ILLNESS (HPI):  87 y.o. female presented to [facility] ED for evaluation of abdominal pain nausea and vomiting. Patient reports had an episode of abdominal pain 2 days ago.  The pain localized in the epigastric area.  The pain radiated to her back.  There was no alleviating factor.  Patient cannot identify any aggravating factor.  She also associated nausea and vomiting with the pain.  She was taken to the emergency room.  At the ED she was found with lipase of over 6000.  Ultrasound shows gallstones and stones in the common bile duct.  There was mild elevation of bilirubin.  I personally evaluated the images.  Surgery is consulted by Dr. Roe Coombs in this context for evaluation and management of gallstone pancreatitis.  PAST MEDICAL HISTORY (PMH):  Past Medical History:  Diagnosis Date   Allergy    Anemia    Arthritis    Atrial fibrillation ([place]) 2004   Breast cancer, left ([place]) 10/2017   Lumpectomy and rad tx's.    CAD (coronary artery disease)    Complication of anesthesia    hard to wake up with general anesthesia   Diabetes ([place])    Dysrhythmia    A-fib   Heart attack ([place]) 2008   Heart murmur    History of kidney stones    History of sick sinus syndrome    s/p pacemaker placement   Hyperlipidemia    Macular degeneration    Neuropathy    Neuropathy    Pacemaker 06/2010   Personal history of radiation therapy 11/2017   LEFT lumpectomy    Squamous cell skin cancer    TIA (transient ischemic attack)      PAST SURGICAL HISTORY ([place]):  Past Surgical History:  Procedure Laterality Date   ABDOMINAL HYSTERECTOMY  1960's   [place] Left 12/17/2016   SINGLE FRAGMENT OF ATYPICAL EPITHELIAL CELLS   BREAST BIOPSY Left 04/08/2017   [facility]   BREAST EXCISIONAL BIOPSY Left 01/18/1986   Benign microcalcifications, [employer].   BREAST LUMPECTOMY Left 05/26/2017   13  mm,T1c, N0; ER/ PR+, Her 2 neu negative, HIGH RISK by Mammoprint ;  Surgeon: [name], MD;  Location: [facility] ORS;  Service: General;  Laterality: Left;   [place] PROPOFOL N/A 09/12/2015   Procedure: COLONOSCOPY WITH PROPOFOL;  Surgeon: [name], MD;  Location: [facility] ENDOSCOPY;  Service: Endoscopy;  Laterality: N/A;   IMPLANTABLE CARDIOVERTER DEFIBRILLATOR (ICD) GENERATOR CHANGE Left 03/29/2019   Procedure: PACEMAKER CHANGE OUT;  Surgeon: [name], MD;  Location: [facility] ORS;  Service: Cardiovascular;  Laterality: Left;   KIDNEY STONE SURGERY  2018   MOHS SURGERY  2019   forehead   pace maker  2008   [place] AND ADENOIDECTOMY  1950"s     MEDICATIONS:  Prior to Admission medications   Medication Sig Start Date End Date Taking? Authorizing Provider  acetaminophen (TYLENOL) 325 MG tablet Take 2 tablets (650 mg total) by mouth every 6 (six) hours as needed for mild pain (or Fever >/= 101). 12/28/20  Yes Domenic Polite, MD  Aflibercept 2 MG/0.05ML SOLN 1 each by Intravitreal route every 8 (eight) weeks.   Yes [provider]  apixaban (ELIQUIS) 2.5 MG  TABS tablet Take 1 tablet (2.5 mg total) by mouth 2 (two) times daily. 07/23/20  Yes Lorella Nimrod, MD  atorvastatin (LIPITOR) 40 MG tablet Take 1 tablet (40 mg total) by mouth daily. 04/18/20  Yes Wieting, Richard, MD  Biotin w/ Vitamins C & E (HAIR/SKIN/NAILS PO) Take 3 tablets by mouth daily. 2 in the am, 1 in the pm   Yes [provider]  Cholecalciferol (VITAMIN D) 50 MCG (2000 UT) CAPS Take 4,000 Units by mouth daily.   Yes [provider]  denosumab (PROLIA) 60 MG/ML SOSY injection Inject 60 mg into the skin every 6 (six) months.   Yes [provider]  divalproex (DEPAKOTE ER) 250 MG 24 hr tablet Take 250 mg by mouth daily. 01/30/21  Yes [provider]  furosemide (LASIX) 20 MG tablet Take 1 tablet (20 mg total) by mouth daily. 12/28/20  Yes Domenic Polite, MD  isosorbide mononitrate (IMDUR) 30 MG 24 hr tablet Take 1 tablet (30 mg total) by mouth daily. 04/18/20  Yes Wieting, Richard, MD  Magnesium 200 MG TABS Take 200 mg by mouth 2 (two) times a day.   Yes [provider]  meclizine (ANTIVERT) 12.5 MG tablet Take 1 tablet (12.5 mg total) by mouth 2 (two) times daily as needed for dizziness. 02/07/20  Yes [name], MD  metFORMIN (GLUCOPHAGE-XR) 500 MG 24 hr tablet Take 1 tablet by mouth twice daily 01/16/21  Yes [name], MD  metoprolol tartrate (LOPRESSOR) 25 MG tablet Take 25 mg by mouth 2 (two) times daily. 06/21/20  Yes [provider]  mirtazapine (REMERON) 15 MG tablet Take 15 mg by mouth at bedtime. 11/17/20  Yes [provider]  Multiple Vitamins-Minerals (PRESERVISION AREDS 2 PO) Take 1 tablet by mouth 2 (two) times daily.    Yes [provider]  mupirocin ointment (BACTROBAN) 2 % Apply 1 application topically daily. 10/21/20  Yes McDonald, Stephan Minister, DPM  OVER THE COUNTER MEDICATION Take 2 tablets by mouth in the morning and at bedtime. Energizing iron tablets   Yes [provider]  Polyethyl Glycol-Propyl Glycol (SYSTANE OP) Apply 1 drop to eye 2 (two) times daily.   Yes [provider]  Probiotic Product (PROBIOTIC DAILY PO) Take by mouth.   Yes [provider]  tamoxifen (NOLVADEX) 20 MG tablet Take 1 tablet (20 mg total) by mouth daily. 10/23/20  Yes [name], MD  vitamin B-12 (CYANOCOBALAMIN) 500 MCG tablet Take 500 mcg by mouth 2 (two) times a day.   Yes [provider]  blood glucose meter kit and supplies Per patient please dispense One touch Ultra2 meter.Use meter and supplies three times a day to check blood sugar. ICD10: E11.40 07/25/15   [name], MD  Blood Glucose Monitoring Suppl (FREESTYLE LITE) DEVI 2 (two) times daily. 03/04/20    [provider]  divalproex (DEPAKOTE) 125 MG DR tablet Take 1 tablet (125 mg total) by mouth at bedtime. Patient not taking: Reported on 02/19/2021 12/28/20   Domenic Polite, MD  glucose blood (FREESTYLE LITE) test strip Used to check blood sugars twice a day. DX E11.9 11/08/20   [name], MD  Lancets (FREESTYLE) lancets 2 (two) times daily. 03/02/20   [provider]     ALLERGIES:  Allergies  Allergen Reactions   Lyrica [Pregabalin] Swelling    Sleepy, does not eat   Neurontin [Gabapentin] Nausea And Vomiting and Other (See Comments)    disoriented   Bactrim [Sulfamethoxazole-Trimethoprim]  Made her dizziness     SOCIAL HISTORY:  Social History   Socioeconomic History   Marital status: Married    Spouse name: Tom   Number of children: 2   Years of education: Not on file   Highest education level: Not on file  Occupational History   Not on file  Tobacco Use   Smoking status: Former    Years: 5.00    Pack years: 0.00    Types: Cigarettes    Quit date: 09/14/1968    Years since quitting: 52.4   Smokeless tobacco: Never   Tobacco comments:    quit 1970  Vaping Use   Vaping Use: Never used  Substance and Sexual Activity   Alcohol use: No    Alcohol/week: 0.0 standard drinks   Drug use: No   Sexual activity: Not on file  Other Topics Concern   Not on file  Social History Narrative   Live home with husband in [place] Determinants of Health   Financial Resource Strain: Not on file  Food Insecurity: Not on file  Transportation Needs: Not on file  Physical Activity: Not on file  Stress: Not on file  Social Connections: Not on file  Intimate Partner Violence: Not on file      FAMILY HISTORY:  Family History  Problem Relation Age of Onset   Stroke Mother    Diabetes Mother    Cancer Maternal Aunt        Breast Cancer   Breast cancer Maternal Aunt    Arthritis Maternal Grandmother    Cancer Maternal Aunt         Lung Cancer   Diabetes Son    Cancer Son    Kidney disease Neg Hx    Bladder Cancer Neg Hx      REVIEW OF SYSTEMS:  Constitutional: denies weight loss, fever, chills, or sweats  Eyes: denies any other vision changes, history of eye injury  ENT: denies sore throat, hearing problems  Respiratory: denies shortness of breath, wheezing  Cardiovascular: denies chest pain, palpitations  Gastrointestinal: Positive abdominal pain, nausea and vomiting Genitourinary: denies burning with urination or urinary frequency Musculoskeletal: denies any other joint pains or cramps  Skin: denies any other rashes or skin discolorations  Neurological: denies any other headache, dizziness, weakness  Psychiatric: denies any other depression, anxiety   All other review of systems were negative   VITAL SIGNS:  Temp:  [97.5 F (36.4 C)-97.9 F (36.6 C)] 97.9 F (36.6 C) (06/09 1146) Pulse Rate:  [64-84] 76 (06/09 1146) Resp:  [13-16] 16 (06/09 1146) BP: (95-146)/(48-69) 98/52 (06/09 1146) SpO2:  [96 %-100 %] 99 % (06/09 1146) Weight:  [52.1 kg] 52.1 kg (06/08 1500)       Weight: 52.1 kg     INTAKE/OUTPUT:  This shift: Total I/O In: -  Out: 325 [Urine:325]  Last 2 shifts: $RemoveB'@[id number]'qEBFHTME$ @   PHYSICAL EXAM:  Constitutional:  -- Normal body habitus  -- Awake, alert, and oriented x3  Eyes:  -- Pupils equally round and reactive to light  -- No scleral icterus  Ear, nose, and throat:  -- No jugular venous distension  Pulmonary:  -- No crackles  -- Equal breath sounds bilaterally -- Breathing non-labored at rest Cardiovascular:  -- S1, S2 present  -- No pericardial rubs Gastrointestinal:  -- Abdomen soft, mild tender in the epigastric area, non-distended, no guarding or rebound tenderness -- No abdominal masses appreciated, pulsatile or otherwise  Musculoskeletal  and Integumentary:  -- Wounds: None appreciated -- Extremities: B/L UE and LE FROM, hands and feet warm, no edema  Neurologic:  --  Motor function: intact and symmetric -- Sensation: intact and symmetric   Labs:  CBC Latest Ref Rng & Units 02/20/2021 02/19/2021 01/20/2021  WBC 4.0 - 10.5 K/uL 7.6 11.4(H) 4.5  Hemoglobin 12.0 - 15.0 g/dL 10.4(L) 10.4(L) 10.0(L)  Hematocrit 36.0 - 46.0 % 33.0(L) 32.4(L) 31.8(L)  Platelets 150 - 400 K/uL 138(L) 142(L) 144(L)   CMP Latest Ref Rng & Units 02/20/2021 02/19/2021 01/28/2021  Glucose 70 - 99 mg/dL 143(H) 99 112(H)  BUN 8 - 23 mg/dL 16 25(H) 15  Creatinine 0.44 - 1.00 mg/dL 0.63 0.77 0.77  Sodium 135 - 145 mmol/L 136 136 138  Potassium 3.5 - 5.1 mmol/L 3.5 4.2 4.1  Chloride 98 - 111 mmol/L 106 103 102  CO2 22 - 32 mmol/L 26 25 30   Calcium 8.9 - 10.3 mg/dL 8.5(L) 9.5 9.8  Total Protein 6.5 - 8.1 g/dL 6.4(L) 7.6 7.0  Total Bilirubin 0.3 - 1.2 mg/dL 0.6 1.6(H) 0.3  Alkaline Phos 38 - 126 U/L 39 47 49  AST 15 - 41 U/L 48(H) 167(H) 21  ALT 0 - 44 U/L 34 56(H) 16     Imaging studies:  EXAM: ULTRASOUND ABDOMEN LIMITED RIGHT UPPER QUADRANT   COMPARISON:  Ultrasound 12/25/2020.  CT 12/25/2020.   FINDINGS: Gallbladder:   Multiple gallstones noted. 4 mm stone noted in the gallbladder neck. Gallbladder slightly distended. Gallbladder wall thickness normal. Negative Murphy sign. Small amount of free fluid noted about the gallbladder.   Common bile duct:   Diameter: 6.6 mm. Multiple tiny gallstones measuring up to 5 mm gallstones noted in the common bile duct.   Liver:   No focal lesion identified. Within normal limits in parenchymal echogenicity. Portal vein is patent on color Doppler imaging with normal direction of blood flow towards the liver.   Other: None.   IMPRESSION: 1. Multiple gallstones noted. 4 mm stone noted in the gallbladder neck. Gallbladder slightly distended. Gallbladder wall thickness normal. Negative Murphy sign. Small amount of fluid noted about the gallbladder.   2. Multiple tiny gallstones noted in the common bile duct. Common bile duct is  normal in caliber 6.6 mm.     Electronically Signed   By: Marcello Moores  Register   On: 02/19/2021 06:17  Assessment/Plan:  87 y.o. female with gallstone pancreatitis, complicated by pertinent comorbidities including history of stroke, history of myocardial infarction, dementia, type 2 diabetes mellitus, history of breast cancer, A. fib.  Patient has been responding well to medical management of pancreatitis.  Most likely cause of pancreatitis is gallstones.  The daughter-in-law and the patient were oriented about the fact of having gallstones and recurrent pancreatitis.  We had a long conversation regarding the usual recommendation of cholecystectomy during admission.  Since the patient had a myocardial infarction and stroke in less than a year and he is suffering of early dementia there is concern of complications during general anesthesia.  On the other hand there is also concern of recurrent pancreatitis.  Will continue to follow-up to continue discussion with the daughter.  Patient will have ERCP by gastroenterology due to the presence of common bile duct stones.  After ERCP the patient can continue medical management of her pancreatitis and watch out for recurrent proctitis after ERCP.  I will continue to discussion with the daughter-in-law to decide if patient will have cholecystectomy during this admission.   Reeves Forth  Dereck Leep, MD

## 2021-02-20 NOTE — Progress Notes (Signed)
[place] [facility] [place] NAME: Holly Rose    MR#:  [id number]  DATE OF BIRTH:  15-Jun-1930  SUBJECTIVE:  CHIEF COMPLAINT:   Chief Complaint  Patient presents with   Abdominal Pain  Pleasantly confused.  Anxious and crying easily.  Worried about her husband.  Keeps REVIEW OF SYSTEMS:  ROS unable to obtain due to her mental status DRUG ALLERGIES:   Allergies  Allergen Reactions   Lyrica [Pregabalin] Swelling    Sleepy, does not eat   Neurontin [Gabapentin] Nausea And Vomiting and Other (See Comments)    disoriented   Bactrim [Sulfamethoxazole-Trimethoprim]     Made her dizziness   VITALS:  Blood pressure 92/78, pulse 73, temperature 97.7 F (36.5 C), temperature source Oral, resp. rate 16, weight 52.1 kg, SpO2 99 %. PHYSICAL EXAMINATION:  Physical Exam 87 year old cachectic looking female lying in the bed in no acute distress.  She is crying easily Lungs clear to auscultation bilaterally, no wheezing rales rhonchi crepitation Cardiovascular irregularly irregular heart rate, no murmur rales or gallop Abdomen soft, benign Neuro nonfocal exam patient is awake but alert to only person.  Pleasantly confused. Skin no rash or lesion Psych: Seems anxious and concerned about her husband.  Crying easily LABORATORY PANEL:  Female CBC Recent Labs  Lab 02/20/21 0544  WBC 7.6  HGB 10.4*  HCT 33.0*  PLT 138*   ------------------------------------------------------------------------------------------------------------------ Chemistries  Recent Labs  Lab 02/20/21 0544  NA 136  K 3.5  CL 106  CO2 26  GLUCOSE 143*  BUN 16  CREATININE 0.63  CALCIUM 8.5*  AST 48*  ALT 34  ALKPHOS 39  BILITOT 0.6   RADIOLOGY:  No results found. ASSESSMENT AND PLAN:  87 y.o. female with medical history significant for early dementia, type 2 diabetes mellitus, breast cancer, CVA, cholelithiasis, history of gallstone pancreatitis and A. fib who presented to the  ED for evaluation of abdominal pain  Acute gallstone pancreatitis Recurrent Appreciate GI input plan for ERCP on 6/10 as patient last dose of Eliquis was on 02/18/2021 at 6 PM We will keep patient n.p.o. for now, continue IV fluid hydration, pain control, IV PPI and antiemetics Surgery input appreciated.  Decision for cholecystectomy is still pending depending on family discussion with surgery   History of atrial fibrillation Continue metoprolol for rate control Hold apixaban for now for planned ERCP tomorrow   Diabetes mellitus with complications of stage IIIa chronic kidney disease Hold metformin Keep patient n.p.o. for now Blood sugar checks every 4 hours   Coronary artery disease Continue metoprolol and nitrates Hold statins due to transaminitis   History of breast cancer Continue tamoxifen   Depression Continue Remeron   We will consult palliative care for goals of care conversation considering her advanced age and dementia  Body mass index is 20.36 kg/m.  Net IO Since Admission: 1,408.95 mL [02/20/21 1545]      Status is: Inpatient  Remains inpatient appropriate because:Ongoing diagnostic testing needed not appropriate for outpatient work up  Dispo: The patient is from: Home              Anticipated d/c is to: Home              Patient currently is not medically stable to d/c.   Difficult to place patient No       DVT prophylaxis:       SCDs Start: 02/19/21 0825     Family  Communication: Updated patient's daughter-in-law over phone on 6/9  All the records are reviewed and case discussed with Care Management/Social Worker. Management plans discussed with the patient, family and they are in agreement.  CODE STATUS: DNR Level of care: Med-Surg  TOTAL TIME TAKING CARE OF THIS PATIENT: 35 minutes.   More than 50% of the time was spent in counseling/coordination of care: YES  POSSIBLE D/C IN 3-4 DAYS, DEPENDING ON CLINICAL CONDITION.  And GI/surgery  evaluation   Max Sane M.D on 02/20/2021 at 3:45 PM  [facility]   CC: Primary care physician; [name], MD  Note: This dictation was prepared with Dragon dictation along with smaller phrase technology. Any transcriptional errors that result from this process are unintentional.

## 2021-02-20 NOTE — Progress Notes (Addendum)
Pt. Refuse 0000 finger stick on 02/19/21 Pt. Has occasional resistant of care.Pt. also requires occasional redirection due to confusion. Continue to monitor

## 2021-02-20 NOTE — Progress Notes (Signed)
GI Inpatient Follow-up Note  Subjective:  Patient seen in follow-up for acute gallstone pancreatitis, choledocholithiasis. No acute events overnight. She denies any new complaints. She denies any recurrent nausea or vomiting. No abdominal pain. She reports she is feeling better. Lipase has improved from 6/969 to 368. WBC decreased from 11.4K to 7.6K. LFTs continue to improve. No family at bedside.   Scheduled Inpatient Medications:   cholecalciferol  4,000 Units Oral Daily   divalproex  250 mg Oral Daily   isosorbide mononitrate  30 mg Oral Daily   magnesium oxide  200 mg Oral BID   metoprolol tartrate  25 mg Oral BID   mirtazapine  15 mg Oral QHS   multivitamin-lutein  1 capsule Oral BID   pantoprazole (PROTONIX) IV  40 mg Intravenous Q24H   tamoxifen  20 mg Oral Daily   vitamin B-12  500 mcg Oral BID    Continuous Inpatient Infusions:    sodium chloride 75 mL/hr at 02/20/21 0646    PRN Inpatient Medications:  acetaminophen, morphine injection, ondansetron **OR** ondansetron (ZOFRAN) IV  Review of Systems: Constitutional: Weight is stable.  Eyes: No changes in vision. ENT: No oral lesions, sore throat.  GI: see HPI.  Heme/Lymph: No easy bruising.  CV: No chest pain.  GU: No hematuria.  Integumentary: No rashes.  Neuro: No headaches.  Psych: No depression/anxiety.  Endocrine: No heat/cold intolerance.  Allergic/Immunologic: No urticaria.  Resp: No cough, SOB.  Musculoskeletal: No joint swelling.    Physical Examination: BP (!) 98/52 (BP Location: Left Arm)   Pulse 76   Temp 97.9 F (36.6 C) (Oral)   Resp 16   Wt 52.1 kg   LMP  (LMP Unknown)   SpO2 99%   BMI 20.36 kg/m  Elderly appearing female in hospital bed. No family bedside. Able to answer questions.  Gen: NAD, alert and oriented x 4 HEENT: PEERLA, EOMI, Neck: supple, no JVD or thyromegaly Chest: CTA bilaterally, no wheezes, crackles, or other adventitious sounds CV: RRR, no m/g/c/r Abd: soft,  nondistended, hypoactive BS in all four quadrants; no RUQ or epigastric TTP, no HSM, guarding, ridigity, or rebound tenderness Ext: no edema, well perfused with 2+ pulses, Skin: no rash or lesions noted Lymph: no LAD  Data: Lab Results  Component Value Date   WBC 7.6 02/20/2021   HGB 10.4 (L) 02/20/2021   HCT 33.0 (L) 02/20/2021   MCV 93.0 02/20/2021   PLT 138 (L) 02/20/2021   Recent Labs  Lab 02/19/21 0143 02/20/21 0544  HGB 10.4* 10.4*   Lab Results  Component Value Date   NA 136 02/20/2021   K 3.5 02/20/2021   CL 106 02/20/2021   CO2 26 02/20/2021   BUN 16 02/20/2021   CREATININE 0.63 02/20/2021   GLU 164 12/01/2014   Lab Results  Component Value Date   ALT 34 02/20/2021   AST 48 (H) 02/20/2021   ALKPHOS 39 02/20/2021   BILITOT 0.6 02/20/2021   No results for input(s): APTT, INR, PTT in the last 168 hours.  RUQ [place] 02/19/2021: IMPRESSION: 1. Multiple gallstones noted. 4 mm stone noted in the gallbladder neck. Gallbladder slightly distended. Gallbladder wall thickness normal. Negative Murphy sign. Small amount of fluid noted about the gallbladder.   2. Multiple tiny gallstones noted in the common bile duct. Common bile duct is normal in caliber 6.6 mm.   Assessment/Plan:   87 y/o Caucasian female with a PMH of early dementia, T2DM, Hx of breast cancer, Hx of CVA,  permanent atrial fibrillation on Eliquis, s/p pacemaker placement, and CAD presented to the [facility] ED this morning for   1. Acute, recurrent gallstone pancreatitis with radiographic evidence of choledocholithiasis (normal CBD diameter 6.6 mm)    2. Cholelithiasis without evidence of cholecystitis   3. Transaminitis - 2/2 #1. Improving.    4. History of atrial fibrillation - holding home Eliquis   5. Dementia   6. DNR status   Recommendations:  - Patient is responding well to conservative management of pancreatitis with resolution of her leukocytosis and improvement in LFTs, lipase. Continue.   - Will start clear liquid diet and advance as tolerated - Plan for ERCP tomorrow around noon with Dr. Allen Norris who is aware of plan of care - Appreciate General Surgery following. Dr. Peyton Najjar following patient.  - Call made to patient's DIL Cheryll Dessert) and she was updated with plan of care - Further recommendations after ERCP tomorrow  I reviewed the risks (including bleeding, perforation, infection, anesthesia complications, post-ERCP pancreatitis, andcardiac/respiratory complications), benefits and alternatives of ERCP. Patient consents to proceed.    Please call with questions or concerns.    Octavia Bruckner, PA-C [facility] [phone] (Cell)

## 2021-02-20 NOTE — Progress Notes (Signed)
Pt. Was ordered q4 blood sugar monitoring. @0400  BS was 57 with recheck confirmation. Pt. NPO for ERCP procedure. 1 ampule of Dextrose 50 was given. Pt. BS is now 208. Pt. Has no sign/symptom of hypo/hyperglycemia. On-call Physician was notified. Will continue to monitor.

## 2021-02-20 NOTE — Progress Notes (Signed)
Mobility Specialist - Progress Note   02/20/21 1100  Mobility  Activity Ambulated in hall  Level of Assistance Minimal assist, patient does 75% or more  Assistive Device None (IV Pole)  Distance Ambulated (ft) 100 ft  Mobility Ambulated with assistance in hallway  Mobility Response Tolerated well  Mobility performed by Mobility specialist  $Mobility charge 1 Mobility    Pre-mobility: 80 HR, 94% SpO2 During mobility: 78 HR, 83% SpO2 Post-mobility: 82 HR, 91% SPO2   Pt was lying supine upon arrival, utilizing RA. Pt sat edge of bed with supervision. Denied dizziness. Pt ambulated in hallway, pushing IV pole. No LOB. Denied SOB, O2 desat to 83% during ambulation. No S/S of distress. Min assist to steer IV pole to prevent fall. Pt returned to bed lying supine, alarmed set.   Kathee Delton Mobility Specialist 02/20/21, 11:54 AM

## 2021-02-21 ENCOUNTER — Encounter: Payer: Self-pay | Admitting: Gastroenterology

## 2021-02-21 ENCOUNTER — Inpatient Hospital Stay: Payer: Medicare Other

## 2021-02-21 ENCOUNTER — Encounter
Admission: EM | Disposition: A | Discharge status: Home Health | Payer: Self-pay | Source: Home / Self Care | Attending: Internal Medicine

## 2021-02-21 ENCOUNTER — Inpatient Hospital Stay: Payer: Medicare Other | Admitting: Anesthesiology

## 2021-02-21 DIAGNOSIS — K8050 Calculus of bile duct without cholangitis or cholecystitis without obstruction: Secondary | ICD-10-CM

## 2021-02-21 HISTORY — PX: ERCP: SHX5425

## 2021-02-21 LAB — GLUCOSE, CAPILLARY
Glucose-Capillary: 101 mg/dL — ABNORMAL HIGH (ref 70–99)
Glucose-Capillary: 104 mg/dL — ABNORMAL HIGH (ref 70–99)
Glucose-Capillary: 113 mg/dL — ABNORMAL HIGH (ref 70–99)
Glucose-Capillary: 113 mg/dL — ABNORMAL HIGH (ref 70–99)
Glucose-Capillary: 136 mg/dL — ABNORMAL HIGH (ref 70–99)
Glucose-Capillary: 150 mg/dL — ABNORMAL HIGH (ref 70–99)

## 2021-02-21 LAB — CBC
HCT: 34.9 % — ABNORMAL LOW (ref 36.0–46.0)
Hemoglobin: 11.4 g/dL — ABNORMAL LOW (ref 12.0–15.0)
MCH: 29.9 pg (ref 26.0–34.0)
MCHC: 32.7 g/dL (ref 30.0–36.0)
MCV: 91.6 fL (ref 80.0–100.0)
Platelets: 163 10*3/uL (ref 150–400)
RBC: 3.81 MIL/uL — ABNORMAL LOW (ref 3.87–5.11)
RDW: 15.9 % — ABNORMAL HIGH (ref 11.5–15.5)
WBC: 6.7 10*3/uL (ref 4.0–10.5)
nRBC: 0 % (ref 0.0–0.2)

## 2021-02-21 LAB — COMPREHENSIVE METABOLIC PANEL
ALT: 28 U/L (ref 0–44)
AST: 36 U/L (ref 15–41)
Albumin: 3.1 g/dL — ABNORMAL LOW (ref 3.5–5.0)
Alkaline Phosphatase: 40 U/L (ref 38–126)
Anion gap: 7 (ref 5–15)
BUN: 10 mg/dL (ref 8–23)
CO2: 23 mmol/L (ref 22–32)
Calcium: 8.6 mg/dL — ABNORMAL LOW (ref 8.9–10.3)
Chloride: 109 mmol/L (ref 98–111)
Creatinine, Ser: 0.57 mg/dL (ref 0.44–1.00)
GFR, Estimated: >60 mL/min (ref >60)
Glucose, Bld: 119 mg/dL — ABNORMAL HIGH (ref 70–99)
Potassium: 3.9 mmol/L (ref 3.5–5.1)
Sodium: 139 mmol/L (ref 135–145)
Total Bilirubin: 0.6 mg/dL (ref 0.3–1.2)
Total Protein: 7.4 g/dL (ref 6.5–8.1)

## 2021-02-21 SURGERY — ERCP, WITH INTERVENTION IF INDICATED
Anesthesia: General

## 2021-02-21 MED ORDER — PROPOFOL 500 MG/50ML IV EMUL
INTRAVENOUS | Status: DC | PRN
  Administered 2021-02-21: 60 ug/kg/min via INTRAVENOUS

## 2021-02-21 MED ORDER — TRAZODONE HCL 50 MG PO TABS
25.0000 mg | ORAL_TABLET | Freq: Every evening | ORAL | Status: DC | PRN
  Administered 2021-02-21 – 2021-02-23 (×3): 25 mg via ORAL
  Filled 2021-02-21 (×4): qty 1

## 2021-02-21 MED ORDER — PROPOFOL 10 MG/ML IV BOLUS
INTRAVENOUS | Status: DC | PRN
  Administered 2021-02-21 (×3): 30 mg via INTRAVENOUS

## 2021-02-21 MED ORDER — PHENYLEPHRINE HCL (PRESSORS) 10 MG/ML IV SOLN
INTRAVENOUS | Status: DC | PRN
  Administered 2021-02-21: 100 ug via INTRAVENOUS
  Administered 2021-02-21: 200 ug via INTRAVENOUS

## 2021-02-21 MED ORDER — HALOPERIDOL LACTATE 5 MG/ML IJ SOLN
1.0000 mg | Freq: Four times a day (QID) | INTRAMUSCULAR | Status: DC | PRN
  Administered 2021-02-21 – 2021-02-25 (×2): 1 mg via INTRAMUSCULAR
  Filled 2021-02-21 (×2): qty 1

## 2021-02-21 MED ORDER — LIDOCAINE HCL (CARDIAC) PF 100 MG/5ML IV SOSY
PREFILLED_SYRINGE | INTRAVENOUS | Status: DC | PRN
  Administered 2021-02-21: 40 mg via INTRAVENOUS

## 2021-02-21 MED ORDER — INDOMETHACIN 50 MG RE SUPP: 100.0000 mg | Freq: Once | RECTAL | Status: AC

## 2021-02-21 MED ORDER — INDOMETHACIN 50 MG RE SUPP
RECTAL | Status: DC | PRN
  Administered 2021-02-21: 100 mg via RECTAL

## 2021-02-21 MED ORDER — INDOMETHACIN 50 MG RE SUPP
RECTAL | Status: AC
  Administered 2021-02-21: 100 mg via RECTAL
  Filled 2021-02-21: qty 2

## 2021-02-21 MED ORDER — SODIUM CHLORIDE 0.9 % IV SOLN: INTRAVENOUS | Status: DC

## 2021-02-21 MED ORDER — LACTATED RINGERS IV SOLN: INTRAVENOUS | Status: DC

## 2021-02-21 NOTE — Consult Note (Addendum)
                                                                                 Consultation Note Date: 02/21/2021   Patient Name: Holly Rose  DOB: 05/07/1930  MRN: [id number]  Age / Sex: 87 y.o., female  PCP: Scott, Charlene, MD Referring Physician: Shah, Vipul, MD  Reason for Consultation: Establishing goals of care  HPI/Patient Profile: Holly Rose is a 87 y.o. female with medical history significant for early dementia, type 2 diabetes mellitus, breast cancer, CVA, cholelithiasis, history of gallstone pancreatitis and A. fib who presented to the ED for evaluation of abdominal pain which started the evening prior to her presentation to the emergency room.  Clinical Assessment and Goals of Care: Patient is resting in bed with her husband of 74 years and daughter-in-law [name] at bedside.  Papers have been scanned into [facility] demonstrating [name] as primary/first HPOA.  Patient smiles during conversation but speaks very little.  What she does talk about is her possessions, and concern for wanting them close to her such as her bag and her shoes.  [name] states that patient has a fear of people stealing things from her.  They discussed that prior to this hospitalization patient and her husband have been living in independent living.  Husband discusses in detail their current living arrangements,  well as their homes and moves over the past years.   They state that sometimes she has some memory impairment, and on occasion forgets who her husband is.  But they both state that she is physically fully functional, and is still able to do ADLs and carry out house chores aside from cooking.  They state that she warms things in the microwave but they are concerned for her to use the stove.  [name] discusses patient's CVA and MI all within a year of time.  We discussed concerns for anesthesia in an 87-year-old female, and she states she discussed this with surgery service as well.  We discussed her  diagnosis, prognosis, GOC, EOL wishes disposition and options.  Created space and opportunity for patient  to explore thoughts and feelings regarding current medical information.   A detailed discussion was had today regarding advanced directives.  Concepts specific to code status, artifical feeding and hydration, IV antibiotics and rehospitalization were discussed.  The difference between an aggressive medical intervention path and a comfort care path was discussed.  Values and goals of care important to patient and family were attempted to be elicited.  Discussed limitations of medical interventions to prolong quality of life in some situations and discussed the concept of human mortality.  Daughter-in-law and  patient's husband state they have opted for ERCP, as well as operative management if indicated during this admission or after. She states that she understands Holly Rose's status may decline significantly following this hospitalization due to age and comorbidities.  She understands the perioperative risks, but states the possibility of recurrent pancreatitis makes those risks acceptable with regards to pain and quality of life. She states she understands that Holly Rose may not be able to continue an independent living status.  She states that she is looking into   alternative plans including paid caregivers.  In regards to outlining care, she understands patient will need GETA during operative management; he states outside of this, Holly Rose would not want a breathing tube/ventilator support, and she would not want CPR.  She states comfort and dignity is most important to them.  Husband speaks up and says that neither of them are afraid to die, and are ready to go when the [facility] is ready to take them.    SUMMARY OF RECOMMENDATIONS   Palliative will continue to follow along.  Plans for ERCP tomorrow.  Amenable to GETA and operative management if indicated.        Primary  Diagnoses: Present on Admission:  Acute pancreatitis  Dementia without behavioral disturbance (HCC)  Depression  Atrial fibrillation (HCC)  CAD (coronary artery disease)  CKD stage 3 due to type 2 diabetes mellitus ([place])  Cholelithiasis  Breast cancer, left ([place])   I have reviewed the medical record, interviewed the patient and family, and examined the patient. The following aspects are pertinent.  Past Medical History:  Diagnosis Date   Allergy    Anemia    Arthritis    Atrial fibrillation ([place]) 2004   Breast cancer, left ([place]) 10/2017   Lumpectomy and rad tx's.    CAD (coronary artery disease)    Complication of anesthesia    hard to wake up with general anesthesia   Diabetes ([place])    Dysrhythmia    A-fib   Heart attack ([place]) 2008   Heart murmur    History of kidney stones    History of sick sinus syndrome    s/p pacemaker placement   Hyperlipidemia    Macular degeneration    Neuropathy    Neuropathy    Pacemaker 06/2010   Personal history of radiation therapy 11/2017   LEFT lumpectomy    Squamous cell skin cancer    TIA (transient ischemic attack)    Social History   Socioeconomic History   Marital status: Married    Spouse name: Tom   Number of children: 2   Years of education: Not on file   Highest education level: Not on file  Occupational History   Not on file  Tobacco Use   Smoking status: Former    Years: 5.00    Pack years: 0.00    Types: Cigarettes    Quit date: 09/14/1968    Years since quitting: 52.4   Smokeless tobacco: Never   Tobacco comments:    quit 1970  Vaping Use   Vaping Use: Never used  Substance and Sexual Activity   Alcohol use: No    Alcohol/week: 0.0 standard drinks   Drug use: No   Sexual activity: Not on file  Other Topics Concern   Not on file  Social History Narrative   Live home with husband in [place]   Social Determinants of Health   Financial Resource Strain: Not on file  Food Insecurity: Not  on file  Transportation Needs: Not on file  Physical Activity: Not on file  Stress: Not on file  Social Connections: Not on file   Family History  Problem Relation Age of Onset   Stroke Mother    Diabetes Mother    Cancer Maternal Aunt        Breast Cancer   Breast cancer Maternal Aunt    Arthritis Maternal Grandmother    Cancer Maternal Aunt        Lung Cancer   Diabetes  Son    Cancer Son    Kidney disease Neg Hx    Bladder Cancer Neg Hx    Scheduled Meds:  cholecalciferol  4,000 Units Oral Daily   divalproex  250 mg Oral Daily   isosorbide mononitrate  30 mg Oral Daily   magnesium oxide  200 mg Oral BID   metoprolol tartrate  25 mg Oral BID   mirtazapine  15 mg Oral QHS   multivitamin-lutein  1 capsule Oral BID   pantoprazole (PROTONIX) IV  40 mg Intravenous Q24H   tamoxifen  20 mg Oral Daily   vitamin B-12  500 mcg Oral BID   Continuous Infusions:  sodium chloride 75 mL/hr at 02/20/21 1337   sodium chloride     PRN Meds:.acetaminophen, haloperidol lactate, morphine injection, ondansetron **OR** ondansetron (ZOFRAN) IV, traZODone Medications Prior to Admission:  Prior to Admission medications   Medication Sig Start Date End Date Taking? Authorizing Provider  acetaminophen (TYLENOL) 325 MG tablet Take 2 tablets (650 mg total) by mouth every 6 (six) hours as needed for mild pain (or Fever >/= 101). 12/28/20  Yes Joseph, Preetha, MD  Aflibercept 2 MG/0.05ML SOLN 1 each by Intravitreal route every 8 (eight) weeks.   Yes [provider]  apixaban (ELIQUIS) 2.5 MG TABS tablet Take 1 tablet (2.5 mg total) by mouth 2 (two) times daily. 07/23/20  Yes Amin, Sumayya, MD  atorvastatin (LIPITOR) 40 MG tablet Take 1 tablet (40 mg total) by mouth daily. 04/18/20  Yes Wieting, Richard, MD  Biotin w/ Vitamins C & E (HAIR/SKIN/NAILS PO) Take 3 tablets by mouth daily. 2 in the am, 1 in the pm   Yes [provider]  Cholecalciferol (VITAMIN D) 50 MCG (2000 UT) CAPS Take  4,000 Units by mouth daily.   Yes [provider]  denosumab (PROLIA) 60 MG/ML SOSY injection Inject 60 mg into the skin every 6 (six) months.   Yes [provider]  divalproex (DEPAKOTE ER) 250 MG 24 hr tablet Take 250 mg by mouth daily. 01/30/21  Yes [provider]  furosemide (LASIX) 20 MG tablet Take 1 tablet (20 mg total) by mouth daily. 12/28/20  Yes Joseph, Preetha, MD  isosorbide mononitrate (IMDUR) 30 MG 24 hr tablet Take 1 tablet (30 mg total) by mouth daily. 04/18/20  Yes Wieting, Richard, MD  Magnesium 200 MG TABS Take 200 mg by mouth 2 (two) times a day.   Yes [provider]  meclizine (ANTIVERT) 12.5 MG tablet Take 1 tablet (12.5 mg total) by mouth 2 (two) times daily as needed for dizziness. 02/07/20  Yes Scott, Charlene, MD  metFORMIN (GLUCOPHAGE-XR) 500 MG 24 hr tablet Take 1 tablet by mouth twice daily 01/16/21  Yes Scott, Charlene, MD  metoprolol tartrate (LOPRESSOR) 25 MG tablet Take 25 mg by mouth 2 (two) times daily. 06/21/20  Yes [provider]  mirtazapine (REMERON) 15 MG tablet Take 15 mg by mouth at bedtime. 11/17/20  Yes [provider]  Multiple Vitamins-Minerals (PRESERVISION AREDS 2 PO) Take 1 tablet by mouth 2 (two) times daily.    Yes [provider]  mupirocin ointment (BACTROBAN) 2 % Apply 1 application topically daily. 10/21/20  Yes McDonald, Adam R, DPM  OVER THE COUNTER MEDICATION Take 2 tablets by mouth in the morning and at bedtime. Energizing iron tablets   Yes [provider]  Polyethyl Glycol-Propyl Glycol (SYSTANE OP) Apply 1 drop to eye 2 (two) times daily.   Yes [provider]    Probiotic Product (PROBIOTIC DAILY PO) Take by mouth.   Yes [provider]  tamoxifen (NOLVADEX) 20 MG tablet Take 1 tablet (20 mg total) by mouth daily. 10/23/20  Yes [name], MD  vitamin B-12 (CYANOCOBALAMIN) 500 MCG tablet Take 500 mcg by mouth 2 (two) times a day.   Yes [provider]  blood glucose meter kit and supplies Per patient please dispense One touch Ultra2 meter.Use meter and supplies three times a day to check blood sugar. ICD10: E11.40 07/25/15   [name], MD  Blood Glucose Monitoring Suppl (FREESTYLE LITE) DEVI 2 (two) times daily. 03/04/20   [provider]  divalproex (DEPAKOTE) 125 MG DR tablet Take 1 tablet (125 mg total) by mouth at bedtime. Patient not taking: Reported on 02/19/2021 12/28/20   Domenic Polite, MD  glucose blood (FREESTYLE LITE) test strip Used to check blood sugars twice a day. DX E11.9 11/08/20   [name], MD  Lancets (FREESTYLE) lancets 2 (two) times daily. 03/02/20   [provider]   Allergies  Allergen Reactions   Lyrica [Pregabalin] Swelling    Sleepy, does not eat   Neurontin [Gabapentin] Nausea And Vomiting and Other (See Comments)    disoriented   Bactrim [Sulfamethoxazole-Trimethoprim]     Made her dizziness   Review of Systems  All other systems reviewed and are negative.  Physical Exam Pulmonary:     Effort: Pulmonary effort is normal.  Neurological:     Mental Status: She is alert.    Vital Signs: BP (!) 138/99 (BP Location: Left Arm)   Pulse 92   Temp 97.6 F (36.4 C) (Oral)   Resp 16   Wt 52.1 kg   LMP  (LMP Unknown)   SpO2 98%   BMI 20.36 kg/m  Pain Scale: 0-10   Pain Score: 0-No pain   SpO2: SpO2: 98 % O2 Device:SpO2: 98 % O2 Flow Rate: .   IO: Intake/output summary:  Intake/Output Summary (Last 24 hours) at 02/21/2021 0932 Last data filed at 02/20/2021 1900 Gross per 24 hour  Intake 1273.68 ml  Output 1675 ml  Net -401.32 ml    LBM:   Baseline Weight: Weight: 55.8 kg Most recent weight: Weight: 52.1 kg        Time In: 3:40 Time Out: 4:30 Time Total: 50 min Greater than 50%  of this time was spent counseling and coordinating care related to the above assessment and plan.  Signed by: Asencion Gowda, NP   Please contact Palliative Medicine  Team phone at [phone] for questions and concerns.  For individual provider: See Shea Evans

## 2021-02-21 NOTE — Transfer of Care (Signed)
Immediate Anesthesia Transfer of Care Note  Patient: Holly Rose  Procedure(s) Performed: ENDOSCOPIC RETROGRADE CHOLANGIOPANCREATOGRAPHY (ERCP)  Patient Location: PACU and Endoscopy Unit  Anesthesia Type:General  Level of Consciousness: drowsy  Airway & Oxygen Therapy: Patient Spontanous Breathing  Post-op Assessment: Report given to RN  Post vital signs: stable  Last Vitals:  Vitals Value Taken Time  BP    Temp    Pulse    Resp    SpO2      Last Pain:  Vitals:   02/21/21 1222  TempSrc: Tympanic  PainSc: 0-No pain         Complications: No notable events documented.

## 2021-02-21 NOTE — Plan of Care (Signed)
PMT note:  Patient is currently off floor for procedure. No family in room at this time.   No charge.

## 2021-02-21 NOTE — Anesthesia Preprocedure Evaluation (Addendum)
Anesthesia Evaluation  Patient identified by MRN, date of birth, ID band Patient confused  General Assessment Comment:No nausea/vomiting since Wednesday (two days ago)  Reviewed: Allergy & Precautions, NPO status , Patient's Chart, lab work & pertinent test results, Unable to perform ROS - Chart review only  History of Anesthesia Complications Negative for: history of anesthetic complications  Airway Mallampati: II  TM Distance: >3 FB Neck ROM: Full    Dental  (+) Upper Dentures   Pulmonary neg pulmonary ROS, neg sleep apnea, neg COPD, Patient abstained from smoking.Not current smoker, former smoker,    Pulmonary exam normal breath sounds clear to auscultation       Cardiovascular Exercise Tolerance: Poor METS: < 3 Mets (-) hypertension+ CAD and + Past MI  + dysrhythmias Atrial Fibrillation + pacemaker + Valvular Problems/Murmurs MR  Rhythm:Irregular Rate:Normal - Systolic murmurs Does not appear pacer dependent on eKG  TTE 2022; MILD MR, TR, PR  EF 55%     Neuro/Psych PSYCHIATRIC DISORDERS Depression Dementia Residual decreased mental status CVA, Residual Symptoms    GI/Hepatic neg GERD  ,(+)     (-) substance abuse  ,   Endo/Other  diabetes  Renal/GU CRFRenal disease     Musculoskeletal   Abdominal   Peds  Hematology  (+) anemia ,   Anesthesia Other Findings Past Medical History: No date: Allergy No date: Anemia No date: Arthritis 2004: Atrial fibrillation ([place]) 10/2017: Breast cancer, left (HCC)     Comment:  Lumpectomy and rad tx's.  No date: CAD (coronary artery disease) No date: Complication of anesthesia     Comment:  hard to wake up with general anesthesia No date: Diabetes ([place]) No date: Dysrhythmia     Comment:  A-fib 2008: Heart attack ([place]) No date: Heart murmur No date: History of kidney stones No date: History of sick sinus syndrome     Comment:  s/p pacemaker placement No date:  Hyperlipidemia No date: Macular degeneration No date: Neuropathy No date: Neuropathy 06/2010: Pacemaker 11/2017: Personal history of radiation therapy     Comment:  LEFT lumpectomy  No date: Squamous cell skin cancer No date: TIA (transient ischemic attack)  Reproductive/Obstetrics                            Anesthesia Physical Anesthesia Plan  ASA: 3  Anesthesia Plan: General   Post-op Pain Management:    Induction: Intravenous  PONV Risk Score and Plan: 3 and Ondansetron, Propofol infusion, TIVA and Treatment may vary due to age or medical condition  Airway Management Planned: Nasal Cannula and Natural Airway  Additional Equipment: None  Intra-op Plan:   Post-operative Plan:   Informed Consent: I have reviewed the patients History and Physical, chart, labs and discussed the procedure including the risks, benefits and alternatives for the proposed anesthesia with the patient or authorized representative who has indicated his/her understanding and acceptance.   Patient has DNR.  Discussed DNR with power of attorney and Suspend DNR.   Dental advisory given and Consent reviewed with POA  Plan Discussed with: CRNA and Surgeon  Anesthesia Plan Comments: (Discussed risks of anesthesia with patient and daughter/POA at bedside, including possibility of difficulty with spontaneous ventilation under anesthesia necessitating airway intervention, PONV, and rare risks such as cardiac or respiratory or neurological events. Discussed DNR and daughter wishes to suspend it perioperatively but doesn't want any heroic measures done in any crisis situation. )  Anesthesia Quick Evaluation  

## 2021-02-21 NOTE — Progress Notes (Signed)
[facility] NAME: Holly Rose    MR#:  [id number]  DATE OF BIRTH:  January 04, 1930  SUBJECTIVE:  CHIEF COMPLAINT:   Chief Complaint  Patient presents with   Abdominal Pain  Pleasantly confused.  Wanting to eat.  Sitter at bedside, waiting for ERCP today REVIEW OF SYSTEMS:  ROS unable to obtain due to her mental status DRUG ALLERGIES:   Allergies  Allergen Reactions   Lyrica [Pregabalin] Swelling    Sleepy, does not eat   Neurontin [Gabapentin] Nausea And Vomiting and Other (See Comments)    disoriented   Bactrim [Sulfamethoxazole-Trimethoprim]     Made her dizziness   VITALS:  Blood pressure (!) 152/85, pulse 73, temperature (!) 96.4 F (35.8 C), temperature source Temporal, resp. rate 16, weight 52.1 kg, SpO2 92 %. PHYSICAL EXAMINATION:  Physical Exam 87 year old cachectic looking female lying in the bed in no acute distress.   Lungs clear to auscultation bilaterally, no wheezing rales rhonchi crepitation Cardiovascular irregularly irregular heart rate, no murmur rales or gallop Abdomen soft, benign Neuro nonfocal exam patient is awake but alert to only person.  Pleasantly confused. Skin no rash or lesion Psych: less anxious today LABORATORY PANEL:  Female CBC Recent Labs  Lab 02/21/21 0432  WBC 6.7  HGB 11.4*  HCT 34.9*  PLT 163    ------------------------------------------------------------------------------------------------------------------ Chemistries  Recent Labs  Lab 02/21/21 0432  NA 139  K 3.9  CL 109  CO2 23  GLUCOSE 119*  BUN 10  CREATININE 0.57  CALCIUM 8.6*  AST 36  ALT 28  ALKPHOS 40  BILITOT 0.6    RADIOLOGY:  DG C-Arm 1-60 Min-No Report  Result Date: 02/21/2021 Fluoroscopy was utilized by the requesting physician.  No radiographic interpretation.   ASSESSMENT AND PLAN:  87 y.o. female with medical history significant for early dementia, type 2 diabetes mellitus, breast cancer, CVA,  cholelithiasis, history of gallstone pancreatitis and A. fib who presented to the ED for evaluation of abdominal pain  Acute gallstone pancreatitis Recurrent S/p ERCP and removal of 6 stones per Dr. Allen Norris continue IV fluid hydration, pain control, IV PPI and antiemetics -Await surgery input for cholecystectomy   History of atrial fibrillation Continue metoprolol for rate control Hold apixaban for now till surgery decides on cholecystectomy   Diabetes mellitus with complications of stage IIIa chronic kidney disease Hold metformin Keep patient n.p.o. for now Blood sugar checks every 4 hours   Coronary artery disease Continue metoprolol and nitrates Hold statins due to transaminitis   History of breast cancer Continue tamoxifen   Depression Continue Remeron   palliative care following  Body mass index is 20.36 kg/m.  Net IO Since Admission: 2,287.96 mL [02/21/21 1709]      Status is: Inpatient  Remains inpatient appropriate because:Ongoing diagnostic testing needed not appropriate for outpatient work up  Dispo: The patient is from: Home              Anticipated d/c is to: Home              Patient currently is not medically stable to d/c.  Awaiting surgery decision for cholecystectomy   Difficult to place patient No  DVT prophylaxis:       SCDs Start: 02/19/21 0825     Family Communication: Updated patient's daughter-in-law over phone on 6/9  All the records are reviewed and case discussed with Care Management/Social Worker. Management plans discussed with the  patient, family and they are in agreement.  CODE STATUS: DNR Level of care: Med-Surg  TOTAL TIME TAKING CARE OF THIS PATIENT: 35 minutes.   More than 50% of the time was spent in counseling/coordination of care: YES  POSSIBLE D/C IN 2-3 DAYS, DEPENDING ON CLINICAL CONDITION.  And surgery plans   Max Sane M.D on 02/21/2021 at 5:09 PM  [facility]   CC: Primary care physician; [name], MD  Note: This dictation was prepared with Dragon dictation along with smaller phrase technology. Any transcriptional errors that result from this process are unintentional.

## 2021-02-21 NOTE — Op Note (Signed)
[facility] Gastroenterology Patient Name: Holly Rose Procedure Date: 02/21/2021 12:22 PM MRN: [id number] Account #: [id number] Date of Birth: 10-Sep-1930 Admit Type: Inpatient Age: 87 Room: [facility] ENDO ROOM 4 Gender: Female Note Status: Finalized Procedure:             ERCP Indications:           Common bile duct stone(s) Providers:             Lucilla Lame MD, MD Referring MD:          [name], MD (Referring MD) Medicines:             Propofol per Anesthesia Complications:         No immediate complications. Procedure:             Pre-Anesthesia Assessment:                        - Prior to the procedure, a History and Physical was                         performed, and patient medications and allergies were                         reviewed. The patient's tolerance of previous                         anesthesia was also reviewed. The risks and benefits                         of the procedure and the sedation options and risks                         were discussed with the patient. All questions were                         answered, and informed consent was obtained. Prior                         Anticoagulants: The patient has taken no previous                         anticoagulant or antiplatelet agents. ASA Grade                         Assessment: II - A patient with mild systemic disease.                         After reviewing the risks and benefits, the patient                         was deemed in satisfactory condition to undergo the                         procedure.                        After obtaining informed consent, the scope was passed  under direct vision. Throughout the procedure, the                         patient's blood pressure, pulse, and oxygen                         saturations were monitored continuously. The [employer] D single use duodenoscope was                          introduced through the mouth, and used to inject                         contrast into and used to inject contrast into the                         bile duct. The ERCP was accomplished without                         difficulty. The patient tolerated the procedure well. Findings:      The scout film was normal. The esophagus was successfully intubated       under direct vision. The scope was advanced to a normal major papilla in       the descending duodenum without detailed examination of the pharynx,       larynx and associated structures, and upper GI tract. The upper GI tract       was grossly normal. The bile duct was deeply cannulated with the       short-nosed traction sphincterotome. Contrast was injected. I personally       interpreted the bile duct images. There was brisk flow of contrast       through the ducts. Image quality was excellent. Contrast extended to the       entire biliary tree. The main bile duct contained filling defect(s)       thought to be a stone. A wire was passed into the biliary tree. A 7 mm       biliary sphincterotomy was made with a traction (standard)       sphincterotome using ERBE electrocautery. There was no       post-sphincterotomy bleeding. The biliary tree was swept with a 15 mm       balloon starting at the bifurcation. Six stones were removed. No stones       remained. Impression:            - A filling defect consistent with a stone was seen on                         the cholangiogram.                        - Choledocholithiasis was found. Complete removal was                         accomplished by biliary sphincterotomy and balloon                         extraction.                        -  A biliary sphincterotomy was performed.                        - The biliary tree was swept. Recommendation:        - Return patient to hospital ward for ongoing care.                        - Clear liquid diet today.                         - Continue present medications. Procedure Code(s):     --- Professional ---                        [phone], Endoscopic retrograde cholangiopancreatography                         (ERCP); with removal of calculi/debris from                         biliary/pancreatic duct(s)                        43262, Endoscopic retrograde cholangiopancreatography                         (ERCP); with sphincterotomy/papillotomy                        [phone], Endoscopic catheterization of the biliary                         ductal system, radiological supervision and                         interpretation Diagnosis Code(s):     --- Professional ---                        K80.50, Calculus of bile duct without cholangitis or                         cholecystitis without obstruction                        R93.2, Abnormal findings on diagnostic imaging of                         liver and biliary tract CPT copyright 2019 American Medical Association. All rights reserved. The codes documented in this report are preliminary and upon coder review may  be revised to meet current compliance requirements. Lucilla Lame MD, MD 02/21/2021 2:21:09 PM This report has been signed electronically. Number of Addenda: 0 Note Initiated On: 02/21/2021 12:22 PM Estimated Blood Loss:  Estimated blood loss: none.      [facility]

## 2021-02-22 LAB — URINALYSIS, COMPLETE (UACMP) WITH MICROSCOPIC
Bilirubin Urine: NEGATIVE
Glucose, UA: NEGATIVE mg/dL
Hgb urine dipstick: NEGATIVE
Ketones, ur: NEGATIVE mg/dL
Nitrite: POSITIVE — AB
Protein, ur: NEGATIVE mg/dL
Specific Gravity, Urine: 1.012 (ref 1.005–1.030)
pH: 7 (ref 5.0–8.0)

## 2021-02-22 LAB — COMPREHENSIVE METABOLIC PANEL
ALT: 22 U/L (ref 0–44)
AST: 25 U/L (ref 15–41)
Albumin: 2.7 g/dL — ABNORMAL LOW (ref 3.5–5.0)
Alkaline Phosphatase: 37 U/L — ABNORMAL LOW (ref 38–126)
Anion gap: 2 — ABNORMAL LOW (ref 5–15)
BUN: 9 mg/dL (ref 8–23)
CO2: 24 mmol/L (ref 22–32)
Calcium: 8.2 mg/dL — ABNORMAL LOW (ref 8.9–10.3)
Chloride: 113 mmol/L — ABNORMAL HIGH (ref 98–111)
Creatinine, Ser: 0.56 mg/dL (ref 0.44–1.00)
GFR, Estimated: >60 mL/min (ref >60)
Glucose, Bld: 84 mg/dL (ref 70–99)
Potassium: 3.9 mmol/L (ref 3.5–5.1)
Sodium: 139 mmol/L (ref 135–145)
Total Bilirubin: 0.6 mg/dL (ref 0.3–1.2)
Total Protein: 6.3 g/dL — ABNORMAL LOW (ref 6.5–8.1)

## 2021-02-22 LAB — CBC
HCT: 30.8 % — ABNORMAL LOW (ref 36.0–46.0)
Hemoglobin: 10.1 g/dL — ABNORMAL LOW (ref 12.0–15.0)
MCH: 29.4 pg (ref 26.0–34.0)
MCHC: 32.8 g/dL (ref 30.0–36.0)
MCV: 89.8 fL (ref 80.0–100.0)
Platelets: 135 10*3/uL — ABNORMAL LOW (ref 150–400)
RBC: 3.43 MIL/uL — ABNORMAL LOW (ref 3.87–5.11)
RDW: 15.8 % — ABNORMAL HIGH (ref 11.5–15.5)
WBC: 5.6 10*3/uL (ref 4.0–10.5)
nRBC: 0 % (ref 0.0–0.2)

## 2021-02-22 LAB — GLUCOSE, CAPILLARY
Glucose-Capillary: 97 mg/dL (ref 70–99)
Glucose-Capillary: 136 mg/dL — ABNORMAL HIGH (ref 70–99)
Glucose-Capillary: 84 mg/dL (ref 70–99)
Glucose-Capillary: 96 mg/dL (ref 70–99)

## 2021-02-22 MED ORDER — INSULIN ASPART 100 UNIT/ML IJ SOLN
0.0000 [IU] | Freq: Three times a day (TID) | INTRAMUSCULAR | Status: DC
  Administered 2021-02-24 – 2021-02-25 (×2): 1 [IU] via SUBCUTANEOUS
  Filled 2021-02-22 (×2): qty 1

## 2021-02-22 MED ORDER — INSULIN ASPART 100 UNIT/ML IJ SOLN
0.0000 [IU] | Freq: Every day | INTRAMUSCULAR | Status: DC
  Administered 2021-02-24: 3 [IU] via SUBCUTANEOUS
  Filled 2021-02-22: qty 1

## 2021-02-22 NOTE — TOC Initial Note (Signed)
Transition of Care [facility]) - Initial/Assessment Note    Patient Details  Name: Holly Rose MRN: [id number] Date of Birth: 1930-02-24  Transition of Care [facility]) CM/SW Contact:    Magnus Ivan, LCSW Phone Number: 02/22/2021, 3:34 PM  Clinical Narrative:       Spoke with patient's daughter in law [name]. Patient lives at [facility] [place] with her spouse. [name] provides transportation. PCP is Dr. [name]. Pharmacy is [facility]. Patient has a RW. Patient had HHPT in the past, unsure of agency used. [name] reported they are open to SNF or Home Health if recommended after procedure on Monday. She has spoken with [facility] and they said patient could go to their SNF [facility]) if needed. TOC will continue to follow.         Expected Discharge Plan: Skilled Nursing Facility Barriers to Discharge: Continued Medical Work up   Patient Goals and CMS Choice Patient states their goals for this hospitalization and ongoing recovery are:: TBD after procedure on Monday CMS Medicare.gov Compare Post Acute Care list provided to:: Patient Represenative (must comment) Choice offered to / list presented to : [facility] POA / Guardian, Adult Children  Expected Discharge Plan and Services Expected Discharge Plan: [place] arrangements for the past 2 months: [place]                                      Prior Living Arrangements/Services Living arrangements for the past 2 months: [place] Lives with:: Spouse Patient language and need for interpreter reviewed:: Yes Do you feel safe going back to the place where you live?: Yes      Need for Family Participation in Patient Care: Yes (Comment) Care giver support system in place?: Yes (comment) Current home services: DME Criminal Activity/Legal Involvement Pertinent to Current Situation/Hospitalization: No - Comment as needed  Activities of Daily  Living Home Assistive Devices/Equipment: Eyeglasses, Gilford Rile (specify type) ADL Screening (condition at time of admission) Patient's cognitive ability adequate to safely complete daily activities?: Yes Is the patient deaf or have difficulty hearing?: No Does the patient have difficulty seeing, even when wearing glasses/contacts?: No Does the patient have difficulty concentrating, remembering, or making decisions?: Yes Patient able to express need for assistance with ADLs?: Yes Does the patient have difficulty dressing or bathing?: Yes Independently performs ADLs?: No Communication: Independent Dressing (OT): Needs assistance Is this a change from baseline?: Change from baseline, expected to last >3 days Grooming: Needs assistance Is this a change from baseline?: Change from baseline, expected to last >3 days Feeding: Independent Bathing: Needs assistance Is this a change from baseline?: Change from baseline, expected to last >3 days Toileting: Needs assistance Is this a change from baseline?: Change from baseline, expected to last >3days In/Out Bed: Needs assistance Is this a change from baseline?: Change from baseline, expected to last >3 days Walks in Home: Needs assistance Is this a change from baseline?: Change from baseline, expected to last >3 days Does the patient have difficulty walking or climbing stairs?: Yes Weakness of Legs: Both Weakness of Arms/Hands: None  Permission Sought/Granted Permission sought to share information with : Facility Art therapist granted to share information with : Yes, Verbal Permission Granted     Permission granted to share info w AGENCY: [place]  Emotional Assessment       Orientation: : Fluctuating Orientation (Suspected and/or reported Sundowners) Alcohol / Substance Use: Not Applicable Psych Involvement: No (comment)  Admission diagnosis:  Acute pancreatitis  [K85.90] Pancreatitis [K85.90] Acute gallstone pancreatitis [K85.10] Patient Active Problem List   Diagnosis Date Noted   Choledocholithiasis    Acute pancreatitis 02/19/2021   CKD stage 3 due to type 2 diabetes mellitus ([place])    Cholelithiasis    Osteoporosis 01/22/2021   Pancreatitis, acute 12/25/2020   Pancreatitis 12/25/2020   Dementia without behavioral disturbance ([place])    Stroke ([place]) 07/22/2020   TIA (transient ischemic attack) 07/22/2020   Depression 07/22/2020   Visual hallucinations 07/21/2020   NSTEMI (non-ST elevated myocardial infarction) ([place]) 04/17/2020   Change in mental status 04/15/2020   Head injury 04/15/2020   Dysuria 04/30/2019   Frequent urinary tract infections 04/30/2019   Age-related osteoporosis without current pathological fracture 03/01/2019   Dizziness 08/29/2018   UTI (urinary tract infection) 08/15/2018   Vitamin D deficiency, unspecified 06/16/2017   Hypercalcinuria 06/14/2017   SCC (squamous cell carcinoma), face 03/29/2017   History of kidney stones 03/12/2017   Breast cancer, left ([place]) 12/28/2016   Chronic cystitis 11/05/2016   Nephrolithiasis 54/98/2641   Renal colic 58/30/9407   Urge incontinence 11/05/2016   Bilateral hand pain 11/03/2016   Generalized osteoarthritis of hand 11/03/2016   Numbness and tingling in both hands 11/03/2016   Bradycardia 04/15/2016   History of colonic polyps 09/16/2015   Abdominal pain 08/18/2015   Rectal bleeding 08/18/2015   CAD (coronary artery disease) 02/17/2015   Atrial fibrillation ([place]) 02/17/2015   Diabetes mellitus with neuropathy ([place]) 02/17/2015   Hyperlipidemia 02/17/2015   Iron deficiency anemia 02/17/2015   Macular degeneration 02/17/2015   Stress 02/17/2015   Health care maintenance 02/17/2015   Diarrhea 02/17/2015   Type II diabetes mellitus ([place]) 07/13/2014   Diverticulosis 03/31/2013   GI bleeding 03/31/2013   Mitral valve prolapse 03/31/2013   Diverticulosis of intestine  without perforation or abscess without bleeding 03/31/2013   PCP:  [name], MD Pharmacy:   [facility] [address] [place] [address] Phone: [phone] Fax: [phone]     Social Determinants of Health (SDOH) Interventions    Readmission Risk Interventions Readmission Risk Prevention Plan 02/22/2021  Transportation Screening Complete  PCP or Specialist Appt within 3-5 Days Complete  HRI or Home Care Consult Complete  Social Work Consult for [place] Planning/Counseling Complete  Palliative Care Screening Complete  Medication Review Press photographer) Complete  Some recent data might be hidden

## 2021-02-22 NOTE — Progress Notes (Signed)
[facility] [place] NAME: Holly Rose    MR#:  [id number]  DATE OF BIRTH:  Oct 10, 1929  SUBJECTIVE:  CHIEF COMPLAINT:   Chief Complaint  Patient presents with   Abdominal Pain  Much more alert and awake, sitting at the edge of bed REVIEW OF SYSTEMS:  ROS unable to obtain due to her mental status DRUG ALLERGIES:   Allergies  Allergen Reactions   Lyrica [Pregabalin] Swelling    Sleepy, does not eat   Neurontin [Gabapentin] Nausea And Vomiting and Other (See Comments)    disoriented   Bactrim [Sulfamethoxazole-Trimethoprim]     Made her dizziness   VITALS:  Blood pressure 135/66, pulse 81, temperature 98.2 F (36.8 C), temperature source Oral, resp. rate 17, weight 52.1 kg, SpO2 99 %. PHYSICAL EXAMINATION:  Physical Exam 87 year old cachectic looking female lying in the bed in no acute distress.   Lungs clear to auscultation bilaterally, no wheezing rales rhonchi crepitation Cardiovascular irregularly irregular heart rate, no murmur rales or gallop Abdomen soft, benign Neuro nonfocal exam patient is awake but alert to only person.  Pleasantly confused. Skin no rash or lesion Psych: Normal mood and affect LABORATORY PANEL:  Female CBC Recent Labs  Lab 02/22/21 0446  WBC 5.6  HGB 10.1*  HCT 30.8*  PLT 135*    ------------------------------------------------------------------------------------------------------------------ Chemistries  Recent Labs  Lab 02/22/21 0446  NA 139  K 3.9  CL 113*  CO2 24  GLUCOSE 84  BUN 9  CREATININE 0.56  CALCIUM 8.2*  AST 25  ALT 22  ALKPHOS 37*  BILITOT 0.6    RADIOLOGY:  DG C-Arm 1-60 Min-No Report  Result Date: 02/21/2021 Fluoroscopy was utilized by the requesting physician.  No radiographic interpretation.   ASSESSMENT AND PLAN:  87 y.o. female with medical history significant for early dementia, type 2 diabetes mellitus, breast cancer, CVA, cholelithiasis, history of  gallstone pancreatitis and A. fib who presented to the ED for evaluation of abdominal pain  Acute gallstone pancreatitis Recurrent S/p ERCP and removal of 6 stones per Dr. Allen Norris on 6/10 continue IV fluid hydration, pain control, IV PPI and antiemetics -Await surgery input for cholecystectomy.  No follow-up notes from surgery on 6/10 or 6/11. -Tentative lap chole plan for Monday per surgeon   History of atrial fibrillation Continue metoprolol for rate control Hold apixaban for now till surgery decides on cholecystectomy   Diabetes mellitus with complications of stage IIIa chronic kidney disease Hold metformin Keep patient n.p.o. for now Blood sugar checks every 4 hours   Coronary artery disease Continue metoprolol and nitrates Hold statins due to transaminitis   History of breast cancer Continue tamoxifen   Depression Continue Remeron   palliative care following  Body mass index is 20.36 kg/m.  Net IO Since Admission: 2,917.96 mL [02/22/21 1300]      Status is: Inpatient  Remains inpatient appropriate because:Ongoing diagnostic testing needed not appropriate for outpatient work up  Dispo: The patient is from: Home              Anticipated d/c is to: Home              Patient currently is not medically stable to d/c.  Awaiting surgery decision for cholecystectomy   Difficult to place patient No  DVT prophylaxis:       SCDs Start: 02/19/21 0825     Family Communication: Updated patient's daughter-in-law over phone on 6/9  All the records are reviewed and case discussed with Care Management/Social Worker. Management plans discussed with the patient, nursing, GI and they are in agreement.  CODE STATUS: DNR Level of care: Med-Surg  TOTAL TIME TAKING CARE OF THIS PATIENT: 35 minutes.   More than 50% of the time was spent in counseling/coordination of care: YES  POSSIBLE D/C IN 2-3 DAYS, DEPENDING ON CLINICAL CONDITION.  And surgery plans   Max Sane M.D on  02/22/2021 at 1:00 PM  [facility]   CC: Primary care physician; [name], MD  Note: This dictation was prepared with Dragon dictation along with smaller phrase technology. Any transcriptional errors that result from this process are unintentional.

## 2021-02-22 NOTE — Final Progress Note (Signed)
Holly Lame, MD [facility]   [address] [place], [place] [address] Phone: [phone] Fax : [phone]   Subjective: The patient is sitting up at the edge of the bed eating without any complaints.  She denies any abdominal pain nausea vomiting fevers chills.   Objective: Vital signs in last 24 hours: Vitals:   02/21/21 1451 02/21/21 2124 02/22/21 0623 02/22/21 0731  BP: (!) 152/85 123/75 (!) 142/77 119/75  Pulse: 73 76 79 76  Resp: 16 18 16 17   Temp:  (!) 97.5 F (36.4 C)  98.2 F (36.8 C)  TempSrc:  Oral  Oral  SpO2: 92% 100%  98%  Weight:       Weight change:   Intake/Output Summary (Last 24 hours) at 02/22/2021 6712 Last data filed at 02/22/2021 0034 Gross per 24 hour  Intake 2099.01 ml  Output 900 ml  Net 1199.01 ml     Exam: General: Patient is in no apparent distress alert but not orientated.   Lab Results: @LABTEST2 @ Micro Results: Recent Results (from the past 240 hour(s))  SARS CORONAVIRUS 2 (TAT 6-24 HRS) Nasopharyngeal Nasopharyngeal Swab     Status: None   Collection Time: 02/19/21  6:18 AM   Specimen: Nasopharyngeal Swab  Result Value Ref Range Status   SARS Coronavirus 2 NEGATIVE NEGATIVE Final    Comment: (NOTE) SARS-CoV-2 target nucleic acids are NOT DETECTED.  The SARS-CoV-2 RNA is generally detectable in upper and lower respiratory specimens during the acute phase of infection. Negative results do not preclude SARS-CoV-2 infection, do not rule out co-infections with other pathogens, and should not be used as the sole basis for treatment or other patient management decisions. Negative results must be combined with clinical observations, patient history, and epidemiological information. The expected result is Negative.  Fact Sheet for Patients: SugarRoll.be  Fact Sheet for Healthcare Providers: https://www.woods-mathews.com/  This test is not yet approved or cleared by the [place] FDA and  has been authorized for detection and/or diagnosis of SARS-CoV-2 by FDA under an Emergency Use Authorization (EUA). This EUA will remain  in effect (meaning this test can be used) for the duration of the COVID-19 declaration under Se ction 564(b)(1) of the Act, 21 U.S.C. section 360bbb-3(b)(1), unless the authorization is terminated or revoked sooner.  Performed at [facility], [place] [address] [place], [place] [address]    Studies/Results: DG C-Arm 1-60 Min-No Report  Result Date: 02/21/2021 Fluoroscopy was utilized by the requesting physician.  No radiographic interpretation.   Medications: I have reviewed the patient's current medications. Scheduled Meds:  cholecalciferol  4,000 Units Oral Daily   divalproex  250 mg Oral Daily   isosorbide mononitrate  30 mg Oral Daily   magnesium oxide  200 mg Oral BID   metoprolol tartrate  25 mg Oral BID   mirtazapine  15 mg Oral QHS   multivitamin-lutein  1 capsule Oral BID   pantoprazole (PROTONIX) IV  40 mg Intravenous Q24H   tamoxifen  20 mg Oral Daily   vitamin B-12  500 mcg Oral BID   Continuous Infusions:  sodium chloride 75 mL/hr at 02/21/21 1156   PRN Meds:.acetaminophen, haloperidol lactate, morphine injection, ondansetron **OR** ondansetron (ZOFRAN) IV, traZODone   Assessment: Principal Problem:   Acute pancreatitis Active Problems:   CAD (coronary artery disease)   Atrial fibrillation (HCC)   Breast cancer, left (HCC)   Depression   Dementia without behavioral disturbance ([place])   CKD stage 3 due to type  2 diabetes mellitus ([place])   Cholelithiasis   Choledocholithiasis    Plan: This patient is status post ERCP with multiple stones removed.  The patient has been doing well without any complaints today.  Nothing further to do from a GI point of view.  I will sign off.  Please call if any further GI concerns or questions.  We would like to thank you for the opportunity to participate in the care  of [place].    LOS: 3 days   Lewayne Bunting 02/22/2021, 9:27 AM Pager [phone] 7am-5pm  Check AMION for 5pm -7am coverage and on weekends

## 2021-02-22 NOTE — Progress Notes (Signed)
Per chart review, patient has memory impairment. CSW attempted call to daughter in law [name] for high risk assessment. Left VM requesting a return call.  Oleh Genin, [place]

## 2021-02-22 NOTE — H&P (View-Only) (Signed)
Patient ID: Holly Rose, female   DOB: 1930-07-14, 87 y.o.   MRN: [id number]     [place] Hospital Day(s): 3.   Post op day(s): 1 Day Post-Op.   Interval History: Patient seen and examined, no acute events or new complaints overnight. Patient reports feeling very good today.  She denies any pain.  She reports she has been tolerating clear liquid diet.  Vital signs in last 24 hours: [min-max] current  Temp:  [97.5 F (36.4 C)-98.2 F (36.8 C)] 98 F (36.7 C) (06/11 1600) Pulse Rate:  [76-90] 90 (06/11 1600) Resp:  [16-18] 16 (06/11 1600) BP: (119-142)/(66-77) 121/73 (06/11 1600) SpO2:  [98 %-100 %] 100 % (06/11 1600)       Weight: 52.1 kg     Physical Exam:  Constitutional: alert, cooperative and no distress  Respiratory: breathing non-labored at rest  Cardiovascular: regular rate and sinus rhythm  Gastrointestinal: soft, non-tender, and non-distended  Labs:  CBC Latest Ref Rng & Units 02/22/2021 02/21/2021 02/20/2021  WBC 4.0 - 10.5 K/uL 5.6 6.7 7.6  Hemoglobin 12.0 - 15.0 g/dL 10.1(L) 11.4(L) 10.4(L)  Hematocrit 36.0 - 46.0 % 30.8(L) 34.9(L) 33.0(L)  Platelets 150 - 400 K/uL 135(L) 163 138(L)   CMP Latest Ref Rng & Units 02/22/2021 02/21/2021 02/20/2021  Glucose 70 - 99 mg/dL 84 119(H) 143(H)  BUN 8 - 23 mg/dL 9 10 16   Creatinine 0.44 - 1.00 mg/dL 0.56 0.57 0.63  Sodium 135 - 145 mmol/L 139 139 136  Potassium 3.5 - 5.1 mmol/L 3.9 3.9 3.5  Chloride 98 - 111 mmol/L 113(H) 109 106  CO2 22 - 32 mmol/L 24 23 26   Calcium 8.9 - 10.3 mg/dL 8.2(L) 8.6(L) 8.5(L)  Total Protein 6.5 - 8.1 g/dL 6.3(L) 7.4 6.4(L)  Total Bilirubin 0.3 - 1.2 mg/dL 0.6 0.6 0.6  Alkaline Phos 38 - 126 U/L 37(L) 40 39  AST 15 - 41 U/L 25 36 48(H)  ALT 0 - 44 U/L 22 28 34    Imaging studies: No new pertinent imaging studies   Assessment/Plan:  87 y.o. female with gallstone pancreatitis and choledocholithiasis 1 Day Post-Op s/p ERCP,complicated by pertinent comorbidities including history  of stroke, history of myocardial infarction, dementia, type 2 diabetes mellitus, history of breast cancer, A. fib.  Patient seems to be doing well after ERCP.  There has been no sign of pancreatitis.  I had a long discussion with the family regarding surgical management.  The family and the patient are aware of the risk of cholecystectomy and the risk of anesthesia.  Patient has been doing well and very stable.  No chest pain, shortness of breath.  Patient has been off anticoagulation.  Patient and family consented to proceed with robotic assisted laparoscopic cholecystectomy.  We will coordinate cholecystectomy for Monday.  Patient to continue care liquid diet.  Patient may have DVT prophylaxis from a standpoint.  Arnold Long, MD

## 2021-02-22 NOTE — Progress Notes (Signed)
Patient ID: Holly Rose, female   DOB: 29-Jan-1930, 87 y.o.   MRN: [id number]     August Hospital Day(s): 3.   Post op day(s): 1 Day Post-Op.   Interval History: Patient seen and examined, no acute events or new complaints overnight. Patient reports feeling very good today.  She denies any pain.  She reports she has been tolerating clear liquid diet.  Vital signs in last 24 hours: [min-max] current  Temp:  [97.5 F (36.4 C)-98.2 F (36.8 C)] 98 F (36.7 C) (06/11 1600) Pulse Rate:  [76-90] 90 (06/11 1600) Resp:  [16-18] 16 (06/11 1600) BP: (119-142)/(66-77) 121/73 (06/11 1600) SpO2:  [98 %-100 %] 100 % (06/11 1600)       Weight: 52.1 kg     Physical Exam:  Constitutional: alert, cooperative and no distress  Respiratory: breathing non-labored at rest  Cardiovascular: regular rate and sinus rhythm  Gastrointestinal: soft, non-tender, and non-distended  Labs:  CBC Latest Ref Rng & Units 02/22/2021 02/21/2021 02/20/2021  WBC 4.0 - 10.5 K/uL 5.6 6.7 7.6  Hemoglobin 12.0 - 15.0 g/dL 10.1(L) 11.4(L) 10.4(L)  Hematocrit 36.0 - 46.0 % 30.8(L) 34.9(L) 33.0(L)  Platelets 150 - 400 K/uL 135(L) 163 138(L)   CMP Latest Ref Rng & Units 02/22/2021 02/21/2021 02/20/2021  Glucose 70 - 99 mg/dL 84 119(H) 143(H)  BUN 8 - 23 mg/dL 9 10 16   Creatinine 0.44 - 1.00 mg/dL 0.56 0.57 0.63  Sodium 135 - 145 mmol/L 139 139 136  Potassium 3.5 - 5.1 mmol/L 3.9 3.9 3.5  Chloride 98 - 111 mmol/L 113(H) 109 106  CO2 22 - 32 mmol/L 24 23 26   Calcium 8.9 - 10.3 mg/dL 8.2(L) 8.6(L) 8.5(L)  Total Protein 6.5 - 8.1 g/dL 6.3(L) 7.4 6.4(L)  Total Bilirubin 0.3 - 1.2 mg/dL 0.6 0.6 0.6  Alkaline Phos 38 - 126 U/L 37(L) 40 39  AST 15 - 41 U/L 25 36 48(H)  ALT 0 - 44 U/L 22 28 34    Imaging studies: No new pertinent imaging studies   Assessment/Plan:  87 y.o. female with gallstone pancreatitis and choledocholithiasis 1 Day Post-Op s/p ERCP,complicated by pertinent comorbidities including history  of stroke, history of myocardial infarction, dementia, type 2 diabetes mellitus, history of breast cancer, A. fib.  Patient seems to be doing well after ERCP.  There has been no sign of pancreatitis.  I had a long discussion with the family regarding surgical management.  The family and the patient are aware of the risk of cholecystectomy and the risk of anesthesia.  Patient has been doing well and very stable.  No chest pain, shortness of breath.  Patient has been off anticoagulation.  Patient and family consented to proceed with robotic assisted laparoscopic cholecystectomy.  We will coordinate cholecystectomy for Monday.  Patient to continue care liquid diet.  Patient may have DVT prophylaxis from a standpoint.  Arnold Long, MD

## 2021-02-23 LAB — COMPREHENSIVE METABOLIC PANEL
ALT: 19 U/L (ref 0–44)
AST: 22 U/L (ref 15–41)
Albumin: 2.5 g/dL — ABNORMAL LOW (ref 3.5–5.0)
Alkaline Phosphatase: 35 U/L — ABNORMAL LOW (ref 38–126)
Anion gap: 2 — ABNORMAL LOW (ref 5–15)
BUN: 6 mg/dL — ABNORMAL LOW (ref 8–23)
CO2: 24 mmol/L (ref 22–32)
Calcium: 8.3 mg/dL — ABNORMAL LOW (ref 8.9–10.3)
Chloride: 114 mmol/L — ABNORMAL HIGH (ref 98–111)
Creatinine, Ser: 0.59 mg/dL (ref 0.44–1.00)
GFR, Estimated: >60 mL/min (ref >60)
Glucose, Bld: 95 mg/dL (ref 70–99)
Potassium: 4.3 mmol/L (ref 3.5–5.1)
Sodium: 140 mmol/L (ref 135–145)
Total Bilirubin: 0.5 mg/dL (ref 0.3–1.2)
Total Protein: 6.1 g/dL — ABNORMAL LOW (ref 6.5–8.1)

## 2021-02-23 LAB — CBC
HCT: 30.8 % — ABNORMAL LOW (ref 36.0–46.0)
Hemoglobin: 10.1 g/dL — ABNORMAL LOW (ref 12.0–15.0)
MCH: 29.9 pg (ref 26.0–34.0)
MCHC: 32.8 g/dL (ref 30.0–36.0)
MCV: 91.1 fL (ref 80.0–100.0)
Platelets: 129 10*3/uL — ABNORMAL LOW (ref 150–400)
RBC: 3.38 MIL/uL — ABNORMAL LOW (ref 3.87–5.11)
RDW: 15.9 % — ABNORMAL HIGH (ref 11.5–15.5)
WBC: 3.7 10*3/uL — ABNORMAL LOW (ref 4.0–10.5)
nRBC: 0 % (ref 0.0–0.2)

## 2021-02-23 LAB — GLUCOSE, CAPILLARY
Glucose-Capillary: 91 mg/dL (ref 70–99)
Glucose-Capillary: 127 mg/dL — ABNORMAL HIGH (ref 70–99)
Glucose-Capillary: 98 mg/dL (ref 70–99)
Glucose-Capillary: 107 mg/dL — ABNORMAL HIGH (ref 70–99)
Glucose-Capillary: 124 mg/dL — ABNORMAL HIGH (ref 70–99)

## 2021-02-23 MED ORDER — CHLORHEXIDINE GLUCONATE CLOTH 2 % EX PADS
6.0000 | MEDICATED_PAD | Freq: Once | CUTANEOUS | Status: AC
Start: 2021-02-23 — End: 2021-02-24
  Administered 2021-02-24: 6 via TOPICAL

## 2021-02-23 MED ORDER — CHLORHEXIDINE GLUCONATE CLOTH 2 % EX PADS
6.0000 | MEDICATED_PAD | Freq: Once | CUTANEOUS | Status: AC
  Administered 2021-02-24: 6 via TOPICAL

## 2021-02-23 NOTE — Progress Notes (Signed)
The bed1       [facility] [place] NAME: Holly Rose    MR#:  [id number]  DATE OF BIRTH:  [birth year]  SUBJECTIVE:  CHIEF COMPLAINT:   Chief Complaint  Patient presents with   Abdominal Pain  Alert, denies any complaints.  Sitter at bedside REVIEW OF SYSTEMS:  ROS unable to obtain due to her mental status DRUG ALLERGIES:   Allergies  Allergen Reactions   Lyrica [Pregabalin] Swelling    Sleepy, does not eat   Neurontin [Gabapentin] Nausea And Vomiting and Other (See Comments)    disoriented   Bactrim [Sulfamethoxazole-Trimethoprim]     Made her dizziness   VITALS:  Blood pressure (!) 120/54, pulse 74, temperature 98 F (36.7 C), temperature source Oral, resp. rate 16, weight 52.1 kg, SpO2 97 %. PHYSICAL EXAMINATION:  Physical Exam 87 year old cachectic looking female lying in the bed in no acute distress.   Lungs clear to auscultation bilaterally, no wheezing rales rhonchi crepitation Cardiovascular irregularly irregular heart rate, no murmur rales or gallop Abdomen soft, benign Neuro nonfocal exam patient is awake and alert.  Pleasantly confused. Skin no rash or lesion Psych: Normal mood and affect LABORATORY PANEL:  Female CBC Recent Labs  Lab 02/23/21 0506  WBC 3.7*  HGB 10.1*  HCT 30.8*  PLT 129*    ------------------------------------------------------------------------------------------------------------------ Chemistries  Recent Labs  Lab 02/23/21 0506  NA 140  K 4.3  CL 114*  CO2 24  GLUCOSE 95  BUN 6*  CREATININE 0.59  CALCIUM 8.3*  AST 22  ALT 19  ALKPHOS 35*  BILITOT 0.5    RADIOLOGY:  No results found. ASSESSMENT AND PLAN:  87 y.o. female with medical history significant for early dementia, type 2 diabetes mellitus, breast cancer, CVA, cholelithiasis, history of gallstone pancreatitis and A. fib who presented to the ED for evaluation of abdominal pain  Acute gallstone pancreatitis Recurrent S/p ERCP and  removal of 6 stones per Dr. Allen Norris on 6/10 continue IV fluid hydration, pain control, IV PPI and antiemetics -Laparoscopic cholecystectomy scheduled for 6/13 at 1 PM per surgeon   History of atrial fibrillation Continue metoprolol for rate control Hold apixaban for planned surgery on Monday   Diabetes mellitus with complications of stage IIIa chronic kidney disease Hold metformin Blood sugar checks every 4 hours   Coronary artery disease Continue metoprolol and nitrates Hold statins due to transaminitis   History of breast cancer Continue tamoxifen   Depression Continue Remeron   palliative care following  Body mass index is 20.36 kg/m.  Net IO Since Admission: 2,977.96 mL [02/23/21 1239]      Status is: Inpatient  Remains inpatient appropriate because:Ongoing diagnostic testing needed not appropriate for outpatient work up  Dispo: The patient is from: Home              Anticipated d/c is to: Home              Patient currently is not medically stable to d/c.   cholecystectomy scheduled for 6/13   Difficult to place patient No  DVT prophylaxis:       SCDs Start: 02/19/21 0825     Family Communication: Updated patient's daughter-in-law over phone on 6/9  All the records are reviewed and case discussed with Care Management/Social Worker. Management plans discussed with the patient, nursing, GI and they are in agreement.  CODE STATUS: DNR Level of care: Med-Surg  TOTAL TIME TAKING CARE OF THIS PATIENT: 35 minutes.  More than 50% of the time was spent in counseling/coordination of care: YES  POSSIBLE D/C IN 2-3 DAYS, DEPENDING ON CLINICAL CONDITION.  And surgery along with postop surgical recovery   Max Sane M.D on 02/23/2021 at 12:39 PM  [facility]   CC: Primary care physician; [name], MD  Note: This dictation was prepared with Dragon dictation along with smaller phrase technology. Any transcriptional errors that result from this process  are unintentional.

## 2021-02-23 NOTE — Anesthesia Postprocedure Evaluation (Signed)
Anesthesia Post Note  Patient: Holly Rose  Procedure(s) Performed: ENDOSCOPIC RETROGRADE CHOLANGIOPANCREATOGRAPHY (ERCP)  Patient location during evaluation: Endoscopy Anesthesia Type: General Level of consciousness: awake and alert Pain management: pain level controlled Vital Signs Assessment: post-procedure vital signs reviewed and stable Respiratory status: spontaneous breathing, nonlabored ventilation, respiratory function stable and patient connected to nasal cannula oxygen Cardiovascular status: blood pressure returned to baseline and stable Postop Assessment: no apparent nausea or vomiting Anesthetic complications: no   No notable events documented.   Last Vitals:  Vitals:   02/23/21 0826 02/23/21 1138  BP: 121/65 (!) 120/54  Pulse: 71 74  Resp: 16 16  Temp: 37.1 C 36.7 C  SpO2: 99% 97%    Last Pain:  Vitals:   02/23/21 1138  TempSrc: Oral  PainSc:                  Arita Miss

## 2021-02-24 ENCOUNTER — Inpatient Hospital Stay: Payer: Medicare Other | Admitting: Anesthesiology

## 2021-02-24 ENCOUNTER — Encounter
Admission: EM | Disposition: A | Discharge status: Home Health | Payer: Self-pay | Source: Home / Self Care | Attending: Internal Medicine

## 2021-02-24 ENCOUNTER — Encounter: Payer: Self-pay | Admitting: Internal Medicine

## 2021-02-24 HISTORY — PX: MASS EXCISION: SHX2000

## 2021-02-24 LAB — GLUCOSE, CAPILLARY
Glucose-Capillary: 88 mg/dL (ref 70–99)
Glucose-Capillary: 83 mg/dL (ref 70–99)
Glucose-Capillary: 178 mg/dL — ABNORMAL HIGH (ref 70–99)
Glucose-Capillary: 144 mg/dL — ABNORMAL HIGH (ref 70–99)
Glucose-Capillary: 144 mg/dL — ABNORMAL HIGH (ref 70–99)
Glucose-Capillary: 251 mg/dL — ABNORMAL HIGH (ref 70–99)

## 2021-02-24 LAB — HEMOGLOBIN A1C
Hgb A1c MFr Bld: 5.7 % — ABNORMAL HIGH (ref 4.8–5.6)
Mean Plasma Glucose: 117 mg/dL

## 2021-02-24 SURGERY — CHOLECYSTECTOMY, ROBOT-ASSISTED, LAPAROSCOPIC
Anesthesia: General

## 2021-02-24 MED ORDER — FENTANYL CITRATE (PF) 100 MCG/2ML IJ SOLN
25.0000 ug | INTRAMUSCULAR | Status: DC | PRN
  Administered 2021-02-24 (×5): 25 ug via INTRAVENOUS

## 2021-02-24 MED ORDER — FENTANYL CITRATE (PF) 100 MCG/2ML IJ SOLN
INTRAMUSCULAR | Status: DC | PRN
  Administered 2021-02-24 (×4): 25 ug via INTRAVENOUS

## 2021-02-24 MED ORDER — LIDOCAINE HCL (CARDIAC) PF 100 MG/5ML IV SOSY
PREFILLED_SYRINGE | INTRAVENOUS | Status: DC | PRN
  Administered 2021-02-24: 100 mg via INTRAVENOUS

## 2021-02-24 MED ORDER — SODIUM CHLORIDE 0.9 % IV SOLN
INTRAVENOUS | Status: AC
  Filled 2021-02-24: qty 10

## 2021-02-24 MED ORDER — ONDANSETRON HCL 4 MG/2ML IJ SOLN: 4.0000 mg | Freq: Once | INTRAMUSCULAR | Status: DC | PRN

## 2021-02-24 MED ORDER — ESMOLOL HCL 100 MG/10ML IV SOLN
INTRAVENOUS | Status: AC
  Filled 2021-02-24: qty 10

## 2021-02-24 MED ORDER — ACETAMINOPHEN 10 MG/ML IV SOLN
INTRAVENOUS | Status: DC | PRN
  Administered 2021-02-24: 1000 mg via INTRAVENOUS

## 2021-02-24 MED ORDER — LIDOCAINE HCL (PF) 2 % IJ SOLN
INTRAMUSCULAR | Status: AC
  Filled 2021-02-24: qty 5

## 2021-02-24 MED ORDER — ONDANSETRON HCL 4 MG/2ML IJ SOLN
INTRAMUSCULAR | Status: DC | PRN
  Administered 2021-02-24: 4 mg via INTRAVENOUS

## 2021-02-24 MED ORDER — LACTATED RINGERS IV SOLN: INTRAVENOUS | Status: DC | PRN

## 2021-02-24 MED ORDER — FENTANYL CITRATE (PF) 100 MCG/2ML IJ SOLN
INTRAMUSCULAR | Status: AC
  Filled 2021-02-24: qty 2

## 2021-02-24 MED ORDER — PROPOFOL 10 MG/ML IV BOLUS
INTRAVENOUS | Status: AC
  Filled 2021-02-24: qty 20

## 2021-02-24 MED ORDER — DEXAMETHASONE SODIUM PHOSPHATE 10 MG/ML IJ SOLN
INTRAMUSCULAR | Status: DC | PRN
  Administered 2021-02-24: 4 mg via INTRAVENOUS

## 2021-02-24 MED ORDER — APIXABAN 2.5 MG PO TABS
2.5000 mg | ORAL_TABLET | Freq: Two times a day (BID) | ORAL | Status: DC
  Administered 2021-02-25 – 2021-02-26 (×3): 2.5 mg via ORAL
  Filled 2021-02-24 (×4): qty 1

## 2021-02-24 MED ORDER — CEFAZOLIN (ANCEF) 1 G IV SOLR: 1.0000 g | INTRAVENOUS | Status: DC

## 2021-02-24 MED ORDER — DEXTROSE-NACL 5-0.9 % IV SOLN: INTRAVENOUS | Status: DC

## 2021-02-24 MED ORDER — ONDANSETRON HCL 4 MG/2ML IJ SOLN
INTRAMUSCULAR | Status: AC
  Filled 2021-02-24: qty 2

## 2021-02-24 MED ORDER — MIDAZOLAM HCL 2 MG/2ML IJ SOLN
INTRAMUSCULAR | Status: AC
  Filled 2021-02-24: qty 2

## 2021-02-24 MED ORDER — SODIUM CHLORIDE 0.9 % IV SOLN
1.0000 g | INTRAVENOUS | Status: AC
  Administered 2021-02-24: 2 g via INTRAVENOUS

## 2021-02-24 MED ORDER — SUGAMMADEX SODIUM 500 MG/5ML IV SOLN
INTRAVENOUS | Status: DC | PRN
  Administered 2021-02-24: 200 mg via INTRAVENOUS

## 2021-02-24 MED ORDER — HYDROCODONE-ACETAMINOPHEN 5-325 MG PO TABS: 1.0000 | ORAL_TABLET | ORAL | 0 refills | Status: AC | PRN

## 2021-02-24 MED ORDER — SODIUM CHLORIDE FLUSH 0.9 % IV SOLN
INTRAVENOUS | Status: AC
  Filled 2021-02-24: qty 3

## 2021-02-24 MED ORDER — DEXTROSE-NACL 5-0.9 % IV SOLN: INTRAVENOUS | Status: DC | PRN

## 2021-02-24 MED ORDER — BUPIVACAINE-EPINEPHRINE 0.25% -1:200000 IJ SOLN
INTRAMUSCULAR | Status: DC | PRN
  Administered 2021-02-24: 30 mL

## 2021-02-24 MED ORDER — SODIUM CHLORIDE 0.9 % IV SOLN: INTRAVENOUS | Status: DC

## 2021-02-24 MED ORDER — ESMOLOL HCL 100 MG/10ML IV SOLN
INTRAVENOUS | Status: DC | PRN
  Administered 2021-02-24: 15 mg via INTRAVENOUS

## 2021-02-24 MED ORDER — INDOCYANINE GREEN 25 MG IV SOLR
1.2500 mg | Freq: Once | INTRAVENOUS | Status: AC
  Administered 2021-02-24: 1.25 mg via INTRAVENOUS
  Filled 2021-02-24: qty 0.5

## 2021-02-24 MED ORDER — PROPOFOL 10 MG/ML IV BOLUS
INTRAVENOUS | Status: DC | PRN
  Administered 2021-02-24: 110 mg via INTRAVENOUS

## 2021-02-24 MED ORDER — ROCURONIUM BROMIDE 100 MG/10ML IV SOLN
INTRAVENOUS | Status: DC | PRN
  Administered 2021-02-24: 40 mg via INTRAVENOUS

## 2021-02-24 SURGICAL SUPPLY — 85 items
"PENCIL ELECTRO HAND CTR " (MISCELLANEOUS) IMPLANT
BAG INFUSER PRESSURE 100CC (MISCELLANEOUS) IMPLANT
BLADE SURG 15 STRL SS SAFETY (BLADE) ×2 IMPLANT
BLADE SURG SZ11 CARB STEEL (BLADE) ×5 IMPLANT
CANISTER SUCT 1200ML W/VALVE (MISCELLANEOUS) ×6 IMPLANT
CANNULA REDUC XI 12-8 STAPL (CANNULA) ×4
CANNULA REDUC XI 12-8MM STAPL (CANNULA) ×1
CANNULA REDUCER 12-8 DVNC XI (CANNULA) ×3 IMPLANT
CHLORAPREP W/TINT 26 (MISCELLANEOUS) ×12 IMPLANT
CLIP VESOLOCK MED LG 6/CT (CLIP) ×5 IMPLANT
CLOSURE WOUND 1/2 X4 (GAUZE/BANDAGES/DRESSINGS)
COTTON BALL STRL MEDIUM (GAUZE/BANDAGES/DRESSINGS) ×2 IMPLANT
COVER WAND RF STERILE (DRAPES) ×12 IMPLANT
DECANTER SPIKE VIAL GLASS SM (MISCELLANEOUS) ×6 IMPLANT
DEFOGGER SCOPE WARMER CLEARIFY (MISCELLANEOUS) ×5 IMPLANT
DERMABOND ADVANCED (GAUZE/BANDAGES/DRESSINGS) ×2
DERMABOND ADVANCED .7 DNX12 (GAUZE/BANDAGES/DRESSINGS) ×3 IMPLANT
DRAPE 3/4 80X56 (DRAPES) ×5 IMPLANT
DRAPE ARM DVNC X/XI (DISPOSABLE) ×12 IMPLANT
DRAPE COLUMN DVNC XI (DISPOSABLE) ×3 IMPLANT
DRAPE DA VINCI XI ARM (DISPOSABLE) ×20
DRAPE DA VINCI XI COLUMN (DISPOSABLE) ×5
DRAPE LAPAROTOMY 100X77 ABD (DRAPES) ×3 IMPLANT
DRSG EMULSION OIL 3X3 NADH (GAUZE/BANDAGES/DRESSINGS) ×2 IMPLANT
DRSG TEGADERM 4X4.75 (GAUZE/BANDAGES/DRESSINGS) ×7 IMPLANT
DRSG TELFA 3X8 NADH (GAUZE/BANDAGES/DRESSINGS) ×5 IMPLANT
DRSG TELFA 4X3 1S NADH ST (GAUZE/BANDAGES/DRESSINGS) ×3 IMPLANT
ELECT REM PT RETURN 9FT ADLT (ELECTROSURGICAL) ×5
ELECTRODE REM PT RTRN 9FT ADLT (ELECTROSURGICAL) ×6 IMPLANT
GAUZE SPONGE 4X4 12PLY STRL (GAUZE/BANDAGES/DRESSINGS) ×3 IMPLANT
GLOVE SURG ENC MOIS LTX SZ6.5 (GLOVE) ×12 IMPLANT
GLOVE SURG ENC MOIS LTX SZ7.5 (GLOVE) ×5 IMPLANT
GLOVE SURG UNDER LTX SZ8 (GLOVE) ×5 IMPLANT
GLOVE SURG UNDER POLY LF SZ6.5 (GLOVE) ×12 IMPLANT
GOWN STRL REUS W/ TWL LRG LVL3 (GOWN DISPOSABLE) ×12 IMPLANT
GOWN STRL REUS W/ TWL XL LVL3 (GOWN DISPOSABLE) ×3 IMPLANT
GOWN STRL REUS W/TWL LRG LVL3 (GOWN DISPOSABLE) ×25
GOWN STRL REUS W/TWL XL LVL3 (GOWN DISPOSABLE) ×5
GRASPER SUT TROCAR 14GX15 (MISCELLANEOUS) ×5 IMPLANT
HANDLE YANKAUER SUCT BULB TIP (MISCELLANEOUS) ×2 IMPLANT
IRRIGATOR SUCT 8 DISP DVNC XI (IRRIGATION / IRRIGATOR) IMPLANT
IRRIGATOR SUCTION 8MM XI DISP (IRRIGATION / IRRIGATOR)
IV NS 1000ML (IV SOLUTION)
IV NS 1000ML BAXH (IV SOLUTION) IMPLANT
KIT PINK PAD W/HEAD ARE REST (MISCELLANEOUS) ×5 IMPLANT
KIT PINK PAD W/HEAD ARM REST (MISCELLANEOUS) ×3 IMPLANT
KIT TURNOVER KIT A (KITS) ×3 IMPLANT
LABEL OR SOLS (LABEL) ×8 IMPLANT
MANIFOLD NEPTUNE II (INSTRUMENTS) ×8 IMPLANT
NDL INSUFFLATION 14GA 120MM (NEEDLE) ×3 IMPLANT
NEEDLE HYPO 22GX1.5 SAFETY (NEEDLE) ×5 IMPLANT
NEEDLE INSUFFLATION 14GA 120MM (NEEDLE) ×5 IMPLANT
NS IRRIG 500ML POUR BTL (IV SOLUTION) ×8 IMPLANT
OBTURATOR OPTICAL STANDARD 8MM (TROCAR) ×5
OBTURATOR OPTICAL STND 8 DVNC (TROCAR) ×3
OBTURATOR OPTICALSTD 8 DVNC (TROCAR) ×3 IMPLANT
PACK BASIN MINOR ARMC (MISCELLANEOUS) ×3 IMPLANT
PACK LAP CHOLECYSTECTOMY (MISCELLANEOUS) ×5 IMPLANT
PAD DRESSING TELFA 3X8 NADH (GAUZE/BANDAGES/DRESSINGS) IMPLANT
PAD PREP 24X41 OB/GYN DISP (PERSONAL CARE ITEMS) ×3 IMPLANT
PENCIL ELECTRO HAND CTR (MISCELLANEOUS) ×5 IMPLANT
POUCH SPECIMEN RETRIEVAL 10MM (ENDOMECHANICALS) ×5 IMPLANT
SEAL CANN UNIV 5-8 DVNC XI (MISCELLANEOUS) ×9 IMPLANT
SEAL XI 5MM-8MM UNIVERSAL (MISCELLANEOUS) ×15
SET TUBE SMOKE EVAC HIGH FLOW (TUBING) ×5 IMPLANT
SOLUTION ELECTROLUBE (MISCELLANEOUS) ×5 IMPLANT
SPONGE LAP 4X18 RFD (DISPOSABLE) IMPLANT
STAPLER CANNULA SEAL DVNC XI (STAPLE) ×3 IMPLANT
STAPLER CANNULA SEAL XI (STAPLE) ×5
STOCKINETTE STRL 6IN 960660 (GAUZE/BANDAGES/DRESSINGS) ×3 IMPLANT
STRIP CLOSURE SKIN 1/2X4 (GAUZE/BANDAGES/DRESSINGS) ×3 IMPLANT
SUT ETHILON 3-0 (SUTURE) ×2 IMPLANT
SUT ETHILON 4-0 (SUTURE) ×15
SUT ETHILON 4-0 FS2 18XMFL BLK (SUTURE) ×9
SUT MNCRL 4-0 (SUTURE) ×5
SUT MNCRL 4-0 27XMFL (SUTURE) ×3
SUT VIC AB 2-0 CT1 (SUTURE) ×5 IMPLANT
SUT VIC AB 3-0 SH 27 (SUTURE) ×5
SUT VIC AB 3-0 SH 27X BRD (SUTURE) ×3 IMPLANT
SUT VIC AB 4-0 FS2 27 (SUTURE) ×5 IMPLANT
SUT VICRYL 0 AB UR-6 (SUTURE) ×5 IMPLANT
SUT VICRYL+ 3-0 144IN (SUTURE) ×3 IMPLANT
SUTURE ETHLN 4-0 FS2 18XMF BLK (SUTURE) ×3 IMPLANT
SUTURE MNCRL 4-0 27XMF (SUTURE) ×3 IMPLANT
SWABSTK COMLB BENZOIN TINCTURE (MISCELLANEOUS) ×3 IMPLANT

## 2021-02-24 NOTE — Op Note (Signed)
Preoperative diagnosis: History of gallstone pancreatitis  Postoperative diagnosis: History of gallstone pancreatitis  Procedure: Robotic Assisted Laparoscopic Cholecystectomy.   Anesthesia: GETA   Surgeon: Dr. Windell Moment  Wound Classification: Clean Contaminated  Indications: Patient is a 87 y.o. female developed epigastric pain and on workup was found to have biliary pancreatitis. Robotic Assisted Laparoscopic cholecystectomy was elected after resolution of acute pancreatitis.  Findings: Critical view of safety achieved   Cystic duct and artery identified, ligated and divided Adequate hemostasis  Description of procedure: The patient was placed on the operating table in the supine position. General anesthesia was induced. A time-out was completed verifying correct patient, procedure, site, positioning, and implant(s) and/or special equipment prior to beginning this procedure. An orogastric tube was placed. The abdomen was prepped and draped in the usual sterile fashion.  An incision was made in a natural skin line below the umbilicus.  The fascia was elevated and the Veress needle inserted. Proper position was confirmed by aspiration and saline meniscus test.  The abdomen was insufflated with carbon dioxide to a pressure of 15 mmHg. The patient tolerated insufflation well. A 8-mm trocar was then inserted in optiview fashion.  The laparoscope was inserted and the abdomen inspected. No injuries from initial trocar placement were noted. Additional trocars were then inserted in the following locations: an 8-mm trocar in the left lateral abdomen, and another two 8-mm trocars to the right side of the abdomen 5 cm appart. The umbilical trocar was changed to a 12 mm trocar all under direct visualization. The abdomen was inspected and no abnormalities were found. The table was placed in the reverse Trendelenburg position with the right side up. The robotic arms were docked and target anatomy  identified. Instrument inserted under direct visualization.  Filmy adhesions between the gallbladder and omentum, duodenum and transverse colon were lysed with electrocautery. The dome of the gallbladder was grasped with a prograsp and retracted over the dome of the liver. The infundibulum was also grasped with an atraumatic grasper and retracted toward the right lower quadrant. This maneuver exposed Calot's triangle. The peritoneum overlying the gallbladder infundibulum was then incised and the cystic duct and cystic artery identified and circumferentially dissected. Critical view of safety reviewed before ligating any structure. Firefly images taken to visualize biliary ducts. The cystic duct and cystic artery were then doubly clipped and divided close to the gallbladder.  The gallbladder was then dissected from its peritoneal attachments by electrocautery. Hemostasis was checked and the gallbladder and contained stones were removed using an endoscopic retrieval bag. The gallbladder was passed off the table as a specimen.  There was no evidence of bleeding from the gallbladder fossa or cystic artery or leakage of the bile from the cystic duct stump. Secondary trocars were removed under direct vision. No bleeding was noted. The robotic arms were undoked. The scope was withdrawn and the umbilical trocar removed. The abdomen was allowed to collapse. The fascia of the 3mm trocar sites was closed with figure-of-eight 0 vicryl sutures. The skin was closed with subcuticular sutures of 4-0 monocryl and topical skin adhesive. The orogastric tube was removed.  The patient tolerated the procedure well and was taken to the postanesthesia care unit in stable condition.   Specimen: Gallbladder  Complications: None  EBL: 10 mL

## 2021-02-24 NOTE — Op Note (Signed)
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  Postoperative diagnosis: Same.  Operative procedure local excision left scalp mass with full-thickness skin graft from the left superior shoulder.  Operating surgeon: Hervey Ard, MD.  Anesthesia: General endotracheal, Marcaine 0.25% with 1: 200,000 units of epinephrine, 10 cc.  Estimated blood loss: 2 cc.  Clinical note: This 87 year old woman has just undergone a laparoscopic cholecystectomy for gallstone pancreatitis.  The week prior she had been seen in office with a enlarging mass on the left parietal scalp just off the midline.  She was felt to be a candidate for excision.  Due to the size of the mass of 2.5 cm it was felt the primary closure would not be possible.  She showed no cervical adenopathy.  Operative note: The patient was asleep from her prior cholecystectomy.  The hair around the scalp lesion was trimmed.  The scalp and the left shoulder were cleansed with ChloraPrep and draped.  Local anesthetic was infiltrated at both sites.  A 2.2 x 6 cm ellipse of skin from the shoulder was excised and defatted.  This was placed in a saline moistened sponge.  The wound was closed with staples.  Attention was turned to the scalp lesion where this was excised with 2 mm borders.  This was shaved off the underlying septal frontalis muscle.  No deep extension noted.  Hemostasis from the skin edges was electrocautery.  The graft was placed in the appropriate position and anchored circumferentially with interrupted 3-0 silk sutures.  The graft was covered with bacitracin coated Adaptic gauze followed by saline moistened cotton balls and a bolster created.  This was then covered with a Tegaderm after applying benzoin to the surrounding skin.  Patient tolerated the procedure well and was taken to recovery room in stable condition.  Plan is for in office takedown of the bolster in 1 week.

## 2021-02-24 NOTE — Anesthesia Postprocedure Evaluation (Signed)
Anesthesia Post Note  Patient: Holly Rose  Procedure(s) Performed: XI ROBOTIC ASSISTED LAPAROSCOPIC CHOLECYSTECTOMY INDOCYANINE GREEN FLUORESCENCE IMAGING (ICG) EXCISION MASS SCALP (Left)  Patient location during evaluation: PACU Anesthesia Type: General Level of consciousness: sedated Pain management: pain level controlled Vital Signs Assessment: post-procedure vital signs reviewed and stable Respiratory status: spontaneous breathing and respiratory function stable Cardiovascular status: stable Anesthetic complications: no   No notable events documented.   Last Vitals:  Vitals:   02/24/21 1027 02/24/21 1400  BP: 128/83 (!) 141/86  Pulse: 79 (!) 107  Resp: 16 14  Temp: (!) 36.2 C   SpO2: 97% 100%    Last Pain:  Vitals:   02/24/21 1027  TempSrc: Temporal  PainSc: 0-No pain                 Darris Carachure K

## 2021-02-24 NOTE — Transfer of Care (Signed)
Immediate Anesthesia Transfer of Care Note  Patient: Holly Rose  Procedure(s) Performed: XI ROBOTIC ASSISTED LAPAROSCOPIC CHOLECYSTECTOMY INDOCYANINE GREEN FLUORESCENCE IMAGING (ICG) EXCISION MASS SCALP (Left)  Patient Location: PACU  Anesthesia Type:General  Level of Consciousness: sedated  Airway & Oxygen Therapy: Patient Spontanous Breathing and Patient connected to nasal cannula oxygen  Post-op Assessment: Report given to RN and Post -op Vital signs reviewed and stable  Post vital signs: Reviewed and stable  Last Vitals:  Vitals Value Taken Time  BP 139/79 02/24/21 1445  Temp 36.4 C 02/24/21 1430  Pulse 39 02/24/21 1447  Resp 21 02/24/21 1447  SpO2 97 % 02/24/21 1447  Vitals shown include unvalidated device data.  Last Pain:  Vitals:   02/24/21 1400  TempSrc:   PainSc: 0-No pain      Patients Stated Pain Goal: 3 (97/28/20 6015)  Complications: No notable events documented.

## 2021-02-24 NOTE — Anesthesia Procedure Notes (Signed)
Procedure Name: Intubation Date/Time: 02/24/2021 11:50 AM Performed by: Justus Memory, CRNA Pre-anesthesia Checklist: Patient identified, Patient being monitored, Timeout performed, Emergency Drugs available and Suction available Patient Re-evaluated:Patient Re-evaluated prior to induction Oxygen Delivery Method: Circle system utilized Preoxygenation: Pre-oxygenation with 100% oxygen Induction Type: IV induction Ventilation: Mask ventilation without difficulty Laryngoscope Size: Mac, 3 and McGraph Grade View: Grade I Tube type: Oral Tube size: 7.0 mm Number of attempts: 1 Airway Equipment and Method: Stylet and Video-laryngoscopy Placement Confirmation: ETT inserted through vocal cords under direct vision, positive ETCO2 and breath sounds checked- equal and bilateral Secured at: 21 cm Tube secured with: Tape Dental Injury: Teeth and Oropharynx as per pre-operative assessment

## 2021-02-24 NOTE — Interval H&P Note (Signed)
History and Physical Interval Note:  02/24/2021 9:56 AM  Holly Rose  has presented today for surgery, with the diagnosis of history of gallstone pancreatitis and choledocholithiasis and a scalp mass.  The various methods of treatment have been discussed with the patient and family. After consideration of risks, benefits and other options for treatment, the patient has consented to  Procedure(s): XI ROBOTIC ASSISTED LAPAROSCOPIC CHOLECYSTECTOMY (N/A) EXCISION MASS SCALP (Left) as a surgical intervention.  The patient's history has been reviewed, patient examined, no change in status, stable for surgery.  I have reviewed the patient's chart and labs.  Patient continue without abdominal pain. Questions were answered to the patient's satisfaction.     Herbert Pun

## 2021-02-24 NOTE — H&P (Signed)
Holly Rose [id number] 04/22/1930     HPI: Mass on scapl. For excison. Based on size, may require skin graft.   Medications Prior to Admission  Medication Sig Dispense Refill Last Dose   acetaminophen (TYLENOL) 325 MG tablet Take 2 tablets (650 mg total) by mouth every 6 (six) hours as needed for mild pain (or Fever >/= 101).   prn at prn   Aflibercept 2 MG/0.05ML SOLN 1 each by Intravitreal route every 8 (eight) weeks.      apixaban (ELIQUIS) 2.5 MG TABS tablet Take 1 tablet (2.5 mg total) by mouth 2 (two) times daily. 60 tablet 1 02/18/2021 at 1800   atorvastatin (LIPITOR) 40 MG tablet Take 1 tablet (40 mg total) by mouth daily. 30 tablet 0 02/18/2021 at Unknown time   Biotin w/ Vitamins C & E (HAIR/SKIN/NAILS PO) Take 3 tablets by mouth daily. 2 in the am, 1 in the pm   02/18/2021 at Unknown time   Cholecalciferol (VITAMIN D) 50 MCG (2000 UT) CAPS Take 4,000 Units by mouth daily.   02/18/2021 at Unknown time   denosumab (PROLIA) 60 MG/ML SOSY injection Inject 60 mg into the skin every 6 (six) months.   02/03/2021   divalproex (DEPAKOTE ER) 250 MG 24 hr tablet Take 250 mg by mouth daily.   02/18/2021 at Unknown time   furosemide (LASIX) 20 MG tablet Take 1 tablet (20 mg total) by mouth daily.   02/18/2021 at Unknown time   isosorbide mononitrate (IMDUR) 30 MG 24 hr tablet Take 1 tablet (30 mg total) by mouth daily. 30 tablet 0 02/18/2021 at Unknown time   Magnesium 200 MG TABS Take 200 mg by mouth 2 (two) times a day.   02/18/2021 at Unknown time   meclizine (ANTIVERT) 12.5 MG tablet Take 1 tablet (12.5 mg total) by mouth 2 (two) times daily as needed for dizziness. 14 tablet 0 prn at prn   metFORMIN (GLUCOPHAGE-XR) 500 MG 24 hr tablet Take 1 tablet by mouth twice daily 90 tablet 0 02/18/2021 at Unknown time   metoprolol tartrate (LOPRESSOR) 25 MG tablet Take 25 mg by mouth 2 (two) times daily.   02/18/2021 at 1800   mirtazapine (REMERON) 15 MG tablet Take 15 mg by mouth at bedtime.   02/18/2021 at Unknown  time   Multiple Vitamins-Minerals (PRESERVISION AREDS 2 PO) Take 1 tablet by mouth 2 (two) times daily.    02/18/2021 at Unknown time   mupirocin ointment (BACTROBAN) 2 % Apply 1 application topically daily. 30 g 1 prn at prn   OVER THE COUNTER MEDICATION Take 2 tablets by mouth in the morning and at bedtime. Energizing iron tablets   02/18/2021 at Unknown time   Polyethyl Glycol-Propyl Glycol (SYSTANE OP) Apply 1 drop to eye 2 (two) times daily.   02/18/2021 at Unknown time   Probiotic Product (PROBIOTIC DAILY PO) Take by mouth.   02/18/2021 at Unknown time   tamoxifen (NOLVADEX) 20 MG tablet Take 1 tablet (20 mg total) by mouth daily. 90 tablet 3 02/18/2021 at Unknown time   vitamin B-12 (CYANOCOBALAMIN) 500 MCG tablet Take 500 mcg by mouth 2 (two) times a day.   02/18/2021 at Unknown time   blood glucose meter kit and supplies Per patient please dispense One touch Ultra2 meter.Use meter and supplies three times a day to check blood sugar. ICD10: E11.40 1 each 0    Blood Glucose Monitoring Suppl (FREESTYLE LITE) DEVI 2 (two) times daily.      divalproex (  DEPAKOTE) 125 MG DR tablet Take 1 tablet (125 mg total) by mouth at bedtime. (Patient not taking: Reported on 02/19/2021)   Not Taking at Unknown time   glucose blood (FREESTYLE LITE) test strip Used to check blood sugars twice a day. DX E11.9 100 each 12    Lancets (FREESTYLE) lancets 2 (two) times daily.      Allergies  Allergen Reactions   Lyrica [Pregabalin] Swelling    Sleepy, does not eat   Neurontin [Gabapentin] Nausea And Vomiting and Other (See Comments)    disoriented   Bactrim [Sulfamethoxazole-Trimethoprim]     Made her dizziness   Past Medical History:  Diagnosis Date   Allergy    Anemia    Arthritis    Atrial fibrillation (HCC) 2004   Breast cancer, left (HCC) 10/2017   Lumpectomy and rad tx's.    CAD (coronary artery disease)    Complication of anesthesia    hard to wake up with general anesthesia   Diabetes (HCC)    Dysrhythmia     A-fib   Heart attack (HCC) 2008   Heart murmur    History of kidney stones    History of sick sinus syndrome    s/p pacemaker placement   Hyperlipidemia    Macular degeneration    Neuropathy    Neuropathy    Pacemaker 06/2010   Personal history of radiation therapy 11/2017   LEFT lumpectomy    Squamous cell skin cancer    TIA (transient ischemic attack)    Past Surgical History:  Procedure Laterality Date   ABDOMINAL HYSTERECTOMY  1960's   APPENDECTOMY  1947   BLADDER SURGERY     1975 and 1995   BREAST BIOPSY Left 12/17/2016   SINGLE FRAGMENT OF ATYPICAL EPITHELIAL CELLS   BREAST BIOPSY Left 04/08/2017   [facility]   BREAST EXCISIONAL BIOPSY Left 01/18/1986   Benign microcalcifications, [employer].   BREAST LUMPECTOMY Left 05/26/2017   13 mm,T1c, N0; ER/ PR+, Her 2 neu negative, HIGH RISK by [facility] ;  Surgeon: ,  W, MD;  Location: [facility];  Service: General;  Laterality: Left;   BREAST SURGERY  1988   CARDIAC CATHETERIZATION  1989   CATARACT EXTRACTION  1997 and 1998   COLONOSCOPY WITH PROPOFOL N/A 09/12/2015   Procedure: COLONOSCOPY WITH PROPOFOL;  Surgeon: Robert T Elliott, MD;  Location: [facility];  Service: Endoscopy;  Laterality: N/A;   ERCP N/A 02/21/2021   Procedure: ENDOSCOPIC RETROGRADE CHOLANGIOPANCREATOGRAPHY (ERCP);  Surgeon: Wohl, Darren, MD;  Location: [facility] END[facility];  Service: Endoscopy;  Laterality: N/A;   IMPLANTABLE CARDIOVERTER DEFIBRILLATOR (ICD) GENERATOR CHANGE Left 03/29/2019   Procedure: PACEMAKER CHANGE OUT;  Surgeon: Paraschos, Alexander, MD;  Location: [facility] OR[facility];  Service: Cardiovascular;  Laterality: Left;   KIDNEY STONE SURGERY  2018   MOHS SURGERY  2019   forehead   pace maker  2008   RECTOCELE REPAIR  1996   TONSILLECTOMY AND ADENOIDECTOMY  1950"s   Social History   Socioeconomic History   Marital status: Married    Spouse name: Tom   Number of children: 2   Years of education: Not on file   Highest education  level: Not on file  Occupational History   Not on file  Tobacco Use   Smoking status: Former    Years: 5.00    Pack years: 0.00    Types: Cigarettes    Quit date: 09/14/1968    Years since quitting: 52.4   Smokeless tobacco: Never     Tobacco comments:    quit 1970  Vaping Use   Vaping Use: Never used  Substance and Sexual Activity   Alcohol use: No    Alcohol/week: 0.0 standard drinks   Drug use: No   Sexual activity: Not on file  Other Topics Concern   Not on file  Social History Narrative   Live home with husband in [place] Strain: Not on file  Food Insecurity: Not on file  Transportation Needs: Not on file  Physical Activity: Not on file  Stress: Not on file  Social Connections: Not on file  Intimate Partner Violence: Not on file   Social History   Social History Narrative   Live home with husband in [place]: Negative.     PE: HEENT: Negative. Lungs: Clear. Cardio: RR.   Assessment/Plan:  Proceed with planned excision of scalp mass and possible skin graft.   Forest Gleason [facility] 02/24/2021

## 2021-02-24 NOTE — Progress Notes (Addendum)
The bed1       [place] [facility] [place] NAME: Holly Rose    MR#:  [id number]  DATE OF BIRTH:  [birth year]  SUBJECTIVE:  CHIEF COMPLAINT:   Chief Complaint  Patient presents with   Abdominal Pain  No new issues.  Waiting for surgery today.  Pleasantly confused REVIEW OF SYSTEMS:  ROS unable to obtain due to her mental status DRUG ALLERGIES:   Allergies  Allergen Reactions   Lyrica [Pregabalin] Swelling    Sleepy, does not eat   Neurontin [Gabapentin] Nausea And Vomiting and Other (See Comments)    disoriented   Bactrim [Sulfamethoxazole-Trimethoprim]     Made her dizziness   VITALS:  Blood pressure (!) 141/86, pulse (!) 107, temperature (!) 97.3 F (36.3 C), resp. rate 14, height 5\' 3"  (1.6 m), weight 52.1 kg, SpO2 100 %. PHYSICAL EXAMINATION:  Physical Exam 87 year old cachectic looking female lying in the bed in no acute distress.   Lungs clear to auscultation bilaterally, no wheezing rales rhonchi crepitation Cardiovascular irregularly irregular heart rate, no murmur rales or gallop Abdomen soft, benign Neuro nonfocal exam patient is awake and alert.  Pleasantly confused. Skin no rash or lesion Psych: Normal mood and affect LABORATORY PANEL:  Female CBC Recent Labs  Lab 02/23/21 0506  WBC 3.7*  HGB 10.1*  HCT 30.8*  PLT 129*    ------------------------------------------------------------------------------------------------------------------ Chemistries  Recent Labs  Lab 02/23/21 0506  NA 140  K 4.3  CL 114*  CO2 24  GLUCOSE 95  BUN 6*  CREATININE 0.59  CALCIUM 8.3*  AST 22  ALT 19  ALKPHOS 35*  BILITOT 0.5    RADIOLOGY:  No results found. ASSESSMENT AND PLAN:  87 y.o. female with medical history significant for early dementia, type 2 diabetes mellitus, breast cancer, CVA, cholelithiasis, history of gallstone pancreatitis and A. fib who presented to the ED for evaluation of abdominal pain  Acute gallstone  pancreatitis Recurrent S/p ERCP and removal of 6 stones per Dr. Allen Norris on 6/10 continue pain meds as needed, IV PPI and antiemetics -Laparoscopic cholecystectomy scheduled for today 6/13 at 1 PM per surgeon   History of atrial fibrillation Continue metoprolol for rate control Hold apixaban for planned surgery today.  Resume postop when okay by surgery   Diabetes mellitus with complications of stage IIIa chronic kidney disease Hold metformin Blood sugar checks every 4 hours   Coronary artery disease Continue metoprolol and nitrates Hold statins due to transaminitis   History of breast cancer Continue tamoxifen   Depression Continue Remeron   palliative care following  Body mass index is 20.35 kg/m.  Net IO Since Admission: 4,322.96 mL [02/24/21 1412]      Status is: Inpatient  Remains inpatient appropriate because:Ongoing diagnostic testing needed not appropriate for outpatient work up  Dispo: The patient is from: Home              Anticipated d/c is to: Home              Patient currently is not medically stable to d/c.   cholecystectomy scheduled for today 6/13   Difficult to place patient No  DVT prophylaxis:       SCDs Start: 02/19/21 0825     Family Communication: Updated patient's daughter-in-law over phone on 6/13  All the records are reviewed and case discussed with Care Management/Social Worker. Management plans discussed with the patient, nursing, GI and they are in agreement.  CODE STATUS: DNR Level  of care: Med-Surg  TOTAL TIME TAKING CARE OF THIS PATIENT: 35 minutes.   More than 50% of the time was spent in counseling/coordination of care: YES  POSSIBLE D/C IN 2-3 DAYS, DEPENDING ON CLINICAL CONDITION.   postop surgical recovery   Max Sane M.D on 02/24/2021 at 2:12 PM  [facility]   CC: Primary care physician; [name], MD  Note: This dictation was prepared with Dragon dictation along with smaller phrase technology. Any  transcriptional errors that result from this process are unintentional.

## 2021-02-24 NOTE — Anesthesia Preprocedure Evaluation (Addendum)
Anesthesia Evaluation  Patient identified by MRN, date of birth, ID band Patient awake    Reviewed: Allergy & Precautions, NPO status , Patient's Chart, lab work & pertinent test results  History of Anesthesia Complications (+) PROLONGED EMERGENCE and history of anesthetic complications  Airway Mallampati: II       Dental  (+) Edentulous Upper   Pulmonary neg sleep apnea, neg COPD, Not current smoker, former smoker,           Cardiovascular (-) hypertension+ Past MI  + dysrhythmias Atrial Fibrillation + pacemaker + Valvular Problems/Murmurs MR      Neuro/Psych neg Seizures Depression Dementia CVA (memory difficulties)    GI/Hepatic Neg liver ROS, neg GERD  ,  Endo/Other  diabetes, Type 2, Oral Hypoglycemic Agents  Renal/GU Renal InsufficiencyRenal disease     Musculoskeletal   Abdominal   Peds  Hematology   Anesthesia Other Findings   Reproductive/Obstetrics                            Anesthesia Physical Anesthesia Plan  ASA: 3  Anesthesia Plan: General   Post-op Pain Management:    Induction: Intravenous  PONV Risk Score and Plan: 3 and Ondansetron and Dexamethasone  Airway Management Planned: Oral ETT  Additional Equipment:   Intra-op Plan:   Post-operative Plan:   Informed Consent: I have reviewed the patients History and Physical, chart, labs and discussed the procedure including the risks, benefits and alternatives for the proposed anesthesia with the patient or authorized representative who has indicated his/her understanding and acceptance.       Plan Discussed with:   Anesthesia Plan Comments:         Anesthesia Quick Evaluation

## 2021-02-24 NOTE — Discharge Instructions (Signed)
  Diet: Resume home heart healthy regular diet.   Activity: Increase activity as tolerated. Light activity and walking are encouraged. Do not drive or drink alcohol if taking narcotic pain medications.  Wound care: Do not remove head dressing until seen by Dr. Bary Castilla. May shower from neck down. Wash face, head around dressing and neck with bath cloth.   Medications: May resume all home medications. For mild to moderate pain: acetaminophen (Tylenol) or ibuprofen (if no kidney disease). Combining Tylenol with alcohol can substantially increase your risk of causing liver disease. Narcotic pain medications, if prescribed, can be used for severe pain, though may cause nausea, constipation, and drowsiness. Do not combine Tylenol and Norco within a 6 hour period as Norco contains Tylenol. If you do not need the narcotic pain medication, you do not need to fill the prescription.  Call office [phone]) at any time if any questions, worsening pain, fevers/chills, bleeding, drainage from incision site, or other concerns.

## 2021-02-25 ENCOUNTER — Other Ambulatory Visit: Admission: RE | Admit: 2021-02-25 | Payer: Medicare Other | Source: Ambulatory Visit

## 2021-02-25 ENCOUNTER — Encounter: Payer: Self-pay | Admitting: General Surgery

## 2021-02-25 LAB — CBC
HCT: 29.5 % — ABNORMAL LOW (ref 36.0–46.0)
Hemoglobin: 9.5 g/dL — ABNORMAL LOW (ref 12.0–15.0)
MCH: 29.9 pg (ref 26.0–34.0)
MCHC: 32.2 g/dL (ref 30.0–36.0)
MCV: 92.8 fL (ref 80.0–100.0)
Platelets: 137 10*3/uL — ABNORMAL LOW (ref 150–400)
RBC: 3.18 MIL/uL — ABNORMAL LOW (ref 3.87–5.11)
RDW: 15.5 % (ref 11.5–15.5)
WBC: 6.9 10*3/uL (ref 4.0–10.5)
nRBC: 0 % (ref 0.0–0.2)

## 2021-02-25 LAB — COMPREHENSIVE METABOLIC PANEL
ALT: 22 U/L (ref 0–44)
AST: 35 U/L (ref 15–41)
Albumin: 2.5 g/dL — ABNORMAL LOW (ref 3.5–5.0)
Alkaline Phosphatase: 35 U/L — ABNORMAL LOW (ref 38–126)
Anion gap: 5 (ref 5–15)
BUN: 10 mg/dL (ref 8–23)
CO2: 25 mmol/L (ref 22–32)
Calcium: 7.5 mg/dL — ABNORMAL LOW (ref 8.9–10.3)
Chloride: 110 mmol/L (ref 98–111)
Creatinine, Ser: 0.6 mg/dL (ref 0.44–1.00)
GFR, Estimated: >60 mL/min (ref >60)
Glucose, Bld: 143 mg/dL — ABNORMAL HIGH (ref 70–99)
Potassium: 4 mmol/L (ref 3.5–5.1)
Sodium: 140 mmol/L (ref 135–145)
Total Bilirubin: 0.7 mg/dL (ref 0.3–1.2)
Total Protein: 6.1 g/dL — ABNORMAL LOW (ref 6.5–8.1)

## 2021-02-25 LAB — GLUCOSE, CAPILLARY
Glucose-Capillary: 118 mg/dL — ABNORMAL HIGH (ref 70–99)
Glucose-Capillary: 124 mg/dL — ABNORMAL HIGH (ref 70–99)
Glucose-Capillary: 103 mg/dL — ABNORMAL HIGH (ref 70–99)
Glucose-Capillary: 167 mg/dL — ABNORMAL HIGH (ref 70–99)

## 2021-02-25 MED ORDER — HYDROCODONE-ACETAMINOPHEN 5-325 MG PO TABS
1.0000 | ORAL_TABLET | Freq: Four times a day (QID) | ORAL | Status: DC | PRN
  Administered 2021-02-25 – 2021-02-26 (×3): 2 via ORAL
  Filled 2021-02-25 (×3): qty 2

## 2021-02-25 MED ORDER — ATORVASTATIN CALCIUM 20 MG PO TABS
40.0000 mg | ORAL_TABLET | Freq: Every day | ORAL | Status: DC
  Administered 2021-02-25 – 2021-02-26 (×2): 40 mg via ORAL
  Filled 2021-02-25 (×3): qty 2

## 2021-02-25 NOTE — Evaluation (Signed)
Physical Therapy Evaluation Patient Details Name: Holly Rose MRN: [id number] DOB: [birth year] Today's Date: 02/25/2021   History of Present Illness  presented to ER secondary to abdominal pain; admitted for management of acute gallstone pancreatitis, s/p ERCP 6/10 and lap cholecystectomy 6/13.  Additionally, s/p L parietal scalp mass removal/biopsy with full-thickness skin graft (from L shoulder) 6/13.  Clinical Impression  Patient seated edge of bed upon arrival to session; just ambulated around nursing station with mobility specialist.  Alert and oriented to basic information; follows commands, pleasant and agreeable throughout session.  Limited recall and integration of new information due to baseline cognitive deficits.  Bilat UE/LE strength and ROM grossly symmetrical and WFL; no focal weakness appreciated.  Able to complete bed mobility with close sup; sit/stand, basic transfers and gait (100') with RW, cga/close sup.  Demonstrates reciprocal stepping pattern with fair step height/length; fair cadence and overall gait speed. Min cuing for postural extension with all transfers/gait (maintains partial trunk flexion due to abdominal pain) Would benefit from skilled PT to address above deficits and promote optimal return to PLOF.; Recommend transition to HHPT upon discharge from acute hospitalization.     Follow Up Recommendations Home health PT    Equipment Recommendations  Rolling walker with 5" wheels    Recommendations for Other Services       Precautions / Restrictions Precautions Precautions: Fall Restrictions Weight Bearing Restrictions: No      Mobility  Bed Mobility Overal bed mobility: Needs Assistance Bed Mobility: Sit to Supine       Sit to supine: Supervision   General bed mobility comments: attempted education in log-rolling for pain management; difficulty comprehending and integrating new information    Transfers Overall transfer level: Needs  assistance Equipment used: Rolling walker (2 wheeled) Transfers: Sit to/from Stand Sit to Stand: Min guard;Supervision            Ambulation/Gait Ambulation/Gait assistance: Min Gaffer (Feet): 100 Feet Assistive device: Rolling walker (2 wheeled)       General Gait Details: reciprocal stepping pattern with fair step height/length; fair cadence and overall gait speed. Min cuing for postural extension with all transfers/gait (maintains partial trunk flexion due to abdominal pain)  Stairs            Wheelchair Mobility    Modified Rankin (Stroke Patients Only)       Balance Overall balance assessment: Needs assistance Sitting-balance support: No upper extremity supported;Feet supported Sitting balance-Leahy Scale: Good     Standing balance support: Bilateral upper extremity supported Standing balance-Leahy Scale: Fair                               Pertinent Vitals/Pain Pain Assessment: Faces Faces Pain Scale: Hurts even more Pain Location: abdomen Pain Descriptors / Indicators: Grimacing Pain Intervention(s): Limited activity within patient's tolerance;Monitored during session;Repositioned    Home Living Family/patient expects to be discharged to:: Private residence [facility] at [employer]) Living Arrangements: Spouse/significant other Available Help at Discharge: Family;Available 24 hours/day Type of Home: Independent living facility Home Access: Level entry     Home Layout: One level Home Equipment: Walker - 2 wheels      Prior Function Level of Independence: Independent with assistive device(s)         Comments: Mod indep/sup with RW for household mobilization     Hand Dominance   Dominant Hand: Right    Extremity/Trunk Assessment   Upper  Extremity Assessment Upper Extremity Assessment: Overall WFL for tasks assessed    Lower Extremity Assessment Lower Extremity Assessment: Overall  WFL for tasks assessed (grossly 4/5 throughout, no focal weakness appreciated)       Communication   Communication: HOH  Cognition Arousal/Alertness: Awake/alert Behavior During Therapy: WFL for tasks assessed/performed Overall Cognitive Status: Within Functional Limits for tasks assessed                                        General Comments      Exercises     Assessment/Plan    PT Assessment Patient needs continued PT services  PT Problem List Decreased strength;Decreased range of motion;Decreased activity tolerance;Decreased balance;Decreased mobility;Decreased knowledge of use of DME;Decreased safety awareness;Decreased knowledge of precautions;Cardiopulmonary status limiting activity       PT Treatment Interventions DME instruction;Therapeutic exercise;Gait training;Functional mobility training;Therapeutic activities;Balance training;Patient/family education    PT Goals (Current goals can be found in the Care Plan section)  Acute Rehab PT Goals Patient Stated Goal: family interested in pursuing rehab prior to transition home PT Goal Formulation: With patient Time For Goal Achievement: 03/11/21 Potential to Achieve Goals: Good    Frequency Min 2X/week   Barriers to discharge        Co-evaluation               AM-PAC PT "6 Clicks" Mobility  Outcome Measure Help needed turning from your back to your side while in a flat bed without using bedrails?: None Help needed moving from lying on your back to sitting on the side of a flat bed without using bedrails?: None Help needed moving to and from a bed to a chair (including a wheelchair)?: A Little Help needed standing up from a chair using your arms (e.g., wheelchair or bedside chair)?: A Little Help needed to walk in hospital room?: A Little Help needed climbing 3-5 steps with a railing? : A Little 6 Click Score: 20    End of Session Equipment Utilized During Treatment: Gait belt Activity  Tolerance: Patient tolerated treatment well Patient left: in bed;with call bell/phone within reach;with bed alarm set Nurse Communication: Mobility status PT Visit Diagnosis: Muscle weakness (generalized) (M62.81);Difficulty in walking, not elsewhere classified (R26.2)    Time: 1610-9604 PT Time Calculation (min) (ACUTE ONLY): 11 min   Charges:   PT Evaluation $PT Eval Moderate Complexity: 1 Mod         Colt Martelle H. Owens Shark, PT, DPT, NCS 02/25/21, 3:51 PM [phone]

## 2021-02-25 NOTE — Plan of Care (Signed)

## 2021-02-25 NOTE — Progress Notes (Signed)
Patient ID: [name], female   DOB: 04/02/1930, 87 y.o.   MRN: [id number]     [facility] Hospital Day(s): 6.   Post op day(s): 1 Day Post-Op.   Interval History: Patient seen and examined, no acute events or new complaints overnight. Patient reports feeling well.  She describes some pain on the lower abdomen from the incisions.  She denies any issues with diet yesterday.  Patient reports feeling comfortable.  She also describes soreness on her head.  Vital signs in last 24 hours: [min-max] current  Temp:  [97.2 F (36.2 C)-98 F (36.7 C)] 97.7 F (36.5 C) (06/14 0403) Pulse Rate:  [77-107] 78 (06/14 0403) Resp:  [12-20] 18 (06/14 0403) BP: (111-147)/(67-97) 133/67 (06/14 0403) SpO2:  [95 %-100 %] 99 % (06/14 0403) Weight:  [52.1 kg] 52.1 kg (06/13 1027)     Height: 5\' 3"  (160 cm) Weight: 52.1 kg BMI (Calculated): 20.35   Physical Exam:  Constitutional: alert, cooperative and no distress  Head: Scalp wound covered with dressing without sign of drainage or fluid collection.  No surrounding erythema. Gastrointestinal: soft, non-tender, and non-distended.  Wounds are dry and clean  Labs:  CBC Latest Ref Rng & Units 02/25/2021 02/23/2021 02/22/2021  WBC 4.0 - 10.5 K/uL 6.9 3.7(L) 5.6  Hemoglobin 12.0 - 15.0 g/dL 9.5(L) 10.1(L) 10.1(L)  Hematocrit 36.0 - 46.0 % 29.5(L) 30.8(L) 30.8(L)  Platelets 150 - 400 K/uL 137(L) 129(L) 135(L)   CMP Latest Ref Rng & Units 02/25/2021 02/23/2021 02/22/2021  Glucose 70 - 99 mg/dL 143(H) 95 84  BUN 8 - 23 mg/dL 10 6(L) 9  Creatinine 0.44 - 1.00 mg/dL 0.60 0.59 0.56  Sodium 135 - 145 mmol/L 140 140 139  Potassium 3.5 - 5.1 mmol/L 4.0 4.3 3.9  Chloride 98 - 111 mmol/L 110 114(H) 113(H)  CO2 22 - 32 mmol/L 25 24 24   Calcium 8.9 - 10.3 mg/dL 7.5(L) 8.3(L) 8.2(L)  Total Protein 6.5 - 8.1 g/dL 6.1(L) 6.1(L) 6.3(L)  Total Bilirubin 0.3 - 1.2 mg/dL 0.7 0.5 0.6  Alkaline Phos 38 - 126 U/L 35(L) 35(L) 37(L)  AST 15 - 41 U/L 35 22 25  ALT 0  - 44 U/L 22 19 22     Imaging studies: No new pertinent imaging studies   Assessment/Plan:  87 y.o. female with history of gastroenteritis 1 Day Post-Op s/p robotic assisted laparoscopic cholecystectomy, complicated by pertinent comorbidities including history of CVA, history of MI, diabetes mellitus, hypertension, A. fib on anticoagulation and pacemaker, dementia.  Patient seems to be recovering adequately.  This morning she is comfortable.  Vital signs are stable.  Patient tolerating diet.  If patient does well with breakfast and is able to ambulate and there will be no further surgical management during this admission.  Will defer to hospitalist for assessment of stability from her medical conditions and will be adequate for discharge.  No contraindication for anticoagulation from surgical standpoint.   -Continue pain management.  Pain medication prescription sent to pharmacy  -Continue regular diet  -I encouraged the patient to ambulate  -No contraindication for anticoagulation  Arnold Long, MD

## 2021-02-25 NOTE — Care Management Important Message (Signed)
Important Message  Patient Details  Name: Holly Rose MRN: [id number] Date of Birth: 12-08-1929   Medicare Important Message Given:  Yes     Dannette Barbara 02/25/2021, 11:55 AM

## 2021-02-25 NOTE — Progress Notes (Signed)
CCMD called stating patient has orders for cardiac monitoring. There are no monitors available even at other units. MD Irene Pap notified via secure chat. Will pass along to day shift.

## 2021-02-25 NOTE — Progress Notes (Signed)
Mobility Specialist - Progress Note   02/25/21 1400  Mobility  Activity Ambulated in hall;Ambulated to bathroom  Level of Assistance Standby assist, set-up cues, supervision of patient - no hands on  Assistive Device Front wheel walker  Distance Ambulated (ft) 200 ft  Mobility Ambulated with assistance in hallway  Mobility Response Tolerated well  Mobility performed by Mobility specialist  $Mobility charge 1 Mobility    Pt supine upon arrival, utilizing RA. Spouse at bedside. Pain in abdomen with movement, meds given prior to session. MinA to get EOB. No dizziness. Ambulated to bathroom prior to ambulating 1 lap in hallway-supervision. HR 90s with O2 > 92%.   Kathee Delton Mobility Specialist 02/25/21, 2:58 PM

## 2021-02-25 NOTE — Progress Notes (Signed)
PROGRESS NOTE    Holly Rose  YOV:[id number] DOB: July 09, 1930 DOA: 02/19/2021 PCP: [name], MD   Brief Narrative:  87 y.o. female with medical history significant for early dementia, type 2 diabetes mellitus, breast cancer, CVA, cholelithiasis, history of gallstone pancreatitis and A. fib who presented to the ED for evaluation of abdominal pain.  Status post MRCP.  Evidence of choledocholithiasis and recurrent gallstone pancreatitis.  Patient status post ERCP and removal sick stones on 6/10.  Follow-up laparoscopic cholecystectomy on 6/13.  Tolerated both procedures well.  Having some postoperative pain but relatively well controlled.   Assessment & Plan:   Principal Problem:   Acute pancreatitis Active Problems:   CAD (coronary artery disease)   Atrial fibrillation (HCC)   Breast cancer, left (HCC)   Depression   Dementia without behavioral disturbance (HCC)   CKD stage 3 due to type 2 diabetes mellitus ([place])   Cholelithiasis   Choledocholithiasis  Acute gallstone pancreatitis Recurrent S/p ERCP and removal of 6 stones per Dr. Allen Norris on 6/10 Status post lap chole 6/13 Tolerated procedure well Moderate postoperative pain Plan: Multimodal pain control IV antiemetics as needed   History of atrial fibrillation Continue metoprolol for rate control Apixaban resumed   Diabetes mellitus with complications of stage IIIa chronic kidney disease Continue holding metformin Accu-Cheks before meals and at bedtime Consider addition of long-acting insulin should glucose control become challenging   Coronary artery disease Continue metoprolol and nitrates Transaminitis improved.  Can resume statin   History of breast cancer Continue tamoxifen   Depression Continue Remeron   DVT prophylaxis: Eliquis Code Status: DNR Family Communication: Daughter at bedside 6/14 Disposition Plan: Status is: Inpatient  Remains inpatient appropriate because:Inpatient level of care  appropriate due to severity of illness  Dispo: The patient is from: ALF              Anticipated d/c is to: SNF              Patient currently is not medically stable to d/c.   Difficult to place patient No       Level of care: Med-Surg  Consultants:  General surgery Palliative care  Procedures:  ERCP Laparoscopic cholecystectomy  Antimicrobials:  None   Subjective: Seen and examined.  Ambulating with daughter.  Postoperative pain moderate.  Objective: Vitals:   02/24/21 2211 02/25/21 0403 02/25/21 0931 02/25/21 1128  BP: 135/72 133/67 135/63 138/82  Pulse: 81 78 64 75  Resp: 16 18    Temp: (!) 97.5 F (36.4 C) 97.7 F (36.5 C) (!) 97.4 F (36.3 C) 97.8 F (36.6 C)  TempSrc: Oral Oral Oral Oral  SpO2: 98% 99% 99% 100%  Weight:      Height:        Intake/Output Summary (Last 24 hours) at 02/25/2021 1603 Last data filed at 02/25/2021 1300 Gross per 24 hour  Intake 1818.8 ml  Output --  Net 1818.8 ml   Filed Weights   02/19/21 0145 02/19/21 1500 02/24/21 1027  Weight: 55.8 kg 52.1 kg 52.1 kg    Examination:  General exam: No acute distress.  Appears frail Respiratory system: Clear to auscultation. Respiratory effort normal. Cardiovascular system: S1-S2, regular rate and rhythm, no murmurs, no pedal edema Gastrointestinal system: Nondistended, tender to palpation epigastrium and right upper quadrant, normal bowel sounds Central nervous system: Alert, oriented x2, no focal deficits Extremities: Symmetric 5 x 5 power. Skin: No rashes, lesions or ulcers Psychiatry: Judgement and insight appear normal. Mood &  affect appropriate.     Data Reviewed: I have personally reviewed following labs and imaging studies  CBC: Recent Labs  Lab 02/20/21 0544 02/21/21 0432 02/22/21 0446 02/23/21 0506 02/25/21 0543  WBC 7.6 6.7 5.6 3.7* 6.9  HGB 10.4* 11.4* 10.1* 10.1* 9.5*  HCT 33.0* 34.9* 30.8* 30.8* 29.5*  MCV 93.0 91.6 89.8 91.1 92.8  PLT 138* 163 135*  129* 465*   Basic Metabolic Panel: Recent Labs  Lab 02/20/21 0544 02/21/21 0432 02/22/21 0446 02/23/21 0506 02/25/21 0543  NA 136 139 139 140 140  K 3.5 3.9 3.9 4.3 4.0  CL 106 109 113* 114* 110  CO2 26 23 24 24 25   GLUCOSE 143* 119* 84 95 143*  BUN 16 10 9  6* 10  CREATININE 0.63 0.57 0.56 0.59 0.60  CALCIUM 8.5* 8.6* 8.2* 8.3* 7.5*   GFR: Estimated Creatinine Clearance: 39.2 mL/min (by C-G formula based on SCr of 0.6 mg/dL). Liver Function Tests: Recent Labs  Lab 02/20/21 0544 02/21/21 0432 02/22/21 0446 02/23/21 0506 02/25/21 0543  AST 48* 36 25 22 35  ALT 34 28 22 19 22   ALKPHOS 39 40 37* 35* 35*  BILITOT 0.6 0.6 0.6 0.5 0.7  PROT 6.4* 7.4 6.3* 6.1* 6.1*  ALBUMIN 2.7* 3.1* 2.7* 2.5* 2.5*   Recent Labs  Lab 02/19/21 0143 02/20/21 0544  LIPASE 6,969* 368*   No results for input(s): AMMONIA in the last 168 hours. Coagulation Profile: No results for input(s): INR, PROTIME in the last 168 hours. Cardiac Enzymes: No results for input(s): CKTOTAL, CKMB, CKMBINDEX, TROPONINI in the last 168 hours. BNP (last 3 results) No results for input(s): PROBNP in the last 8760 hours. HbA1C: No results for input(s): HGBA1C in the last 72 hours. CBG: Recent Labs  Lab 02/24/21 1505 02/24/21 1608 02/24/21 2051 02/25/21 0754 02/25/21 1127  GLUCAP 144* 144* 251* 118* 124*   Lipid Profile: No results for input(s): CHOL, HDL, LDLCALC, TRIG, CHOLHDL, LDLDIRECT in the last 72 hours. Thyroid Function Tests: No results for input(s): TSH, T4TOTAL, FREET4, T3FREE, THYROIDAB in the last 72 hours. Anemia Panel: No results for input(s): VITAMINB12, FOLATE, FERRITIN, TIBC, IRON, RETICCTPCT in the last 72 hours. Sepsis Labs: No results for input(s): PROCALCITON, LATICACIDVEN in the last 168 hours.  Recent Results (from the past 240 hour(s))  SARS CORONAVIRUS 2 (TAT 6-24 HRS) Nasopharyngeal Nasopharyngeal Swab     Status: None   Collection Time: 02/19/21  6:18 AM   Specimen:  Nasopharyngeal Swab  Result Value Ref Range Status   SARS Coronavirus 2 NEGATIVE NEGATIVE Final    Comment: (NOTE) SARS-CoV-2 target nucleic acids are NOT DETECTED.  The SARS-CoV-2 RNA is generally detectable in upper and lower respiratory specimens during the acute phase of infection. Negative results do not preclude SARS-CoV-2 infection, do not rule out co-infections with other pathogens, and should not be used as the sole basis for treatment or other patient management decisions. Negative results must be combined with clinical observations, patient history, and epidemiological information. The expected result is Negative.  Fact Sheet for Patients: SugarRoll.be  Fact Sheet for Healthcare Providers: https://www.woods-mathews.com/  This test is not yet approved or cleared by the [place] FDA and  has been authorized for detection and/or diagnosis of SARS-CoV-2 by FDA under an Emergency Use Authorization (EUA). This EUA will remain  in effect (meaning this test can be used) for the duration of the COVID-19 declaration under Se ction 564(b)(1) of the Act, 21 U.S.C. section 360bbb-3(b)(1), unless the authorization is  terminated or revoked sooner.  Performed at [facility], [place] [address], [place], [place] [address]          Radiology Studies: No results found.      Scheduled Meds:  apixaban  2.5 mg Oral BID   atorvastatin  40 mg Oral Daily   cholecalciferol  4,000 Units Oral Daily   divalproex  250 mg Oral Daily   insulin aspart  0-5 Units Subcutaneous QHS   insulin aspart  0-9 Units Subcutaneous TID WC   isosorbide mononitrate  30 mg Oral Daily   magnesium oxide  200 mg Oral BID   metoprolol tartrate  25 mg Oral BID   mirtazapine  15 mg Oral QHS   multivitamin-lutein  1 capsule Oral BID   pantoprazole (PROTONIX) IV  40 mg Intravenous Q24H   tamoxifen  20 mg Oral Daily   vitamin B-12  500 mcg Oral BID    Continuous Infusions:  sodium chloride       LOS: 6 days    Time spent: 25 minutes    Sidney Ace, MD [facility] Pager [phone]  If 7PM-7AM, please contact night-coverage 02/25/2021, 4:03 PM

## 2021-02-25 NOTE — Progress Notes (Signed)
Daily Progress Note   Patient Name: Holly Rose       Date: 02/25/2021 DOB: 09/13/1930  Age: 87 y.o. MRN#: [id number] Attending Physician: Sidney Ace, MD Primary Care Physician: [name], MD Admit Date: 02/19/2021  Reason for Consultation/Follow-up: Establishing goals of care  Subjective: Patient is resting in bed, no family at bedside. Since last visit she has had an ERCP and a lap-chole. She denies complaint- but winces and takes a deep breath at times while talking. She seems very happy and talkative today. She is mostly oriented, but demonstrates confusion with some statements intermittently. She states she enjoys getting out of bed and walks even though she has pain.   Given her age and frailty, I would recommend continuing Tylenol for mild pain. I would recommend changing Morphine IV for severe pain to Norco 1 tab q 6 hours for moderate pain,  and 2 tabs q 6 hours for severe pain during the first 3 days post op. Could then change to Ultram for moderate pain and Norco for severe pain.   Length of Stay: 6  Current Medications: Scheduled Meds:   apixaban  2.5 mg Oral BID   atorvastatin  40 mg Oral Daily   cholecalciferol  4,000 Units Oral Daily   divalproex  250 mg Oral Daily   insulin aspart  0-5 Units Subcutaneous QHS   insulin aspart  0-9 Units Subcutaneous TID WC   isosorbide mononitrate  30 mg Oral Daily   magnesium oxide  200 mg Oral BID   metoprolol tartrate  25 mg Oral BID   mirtazapine  15 mg Oral QHS   multivitamin-lutein  1 capsule Oral BID   pantoprazole (PROTONIX) IV  40 mg Intravenous Q24H   tamoxifen  20 mg Oral Daily   vitamin B-12  500 mcg Oral BID    Continuous Infusions:  sodium chloride      PRN Meds: acetaminophen, haloperidol lactate,  morphine injection, ondansetron **OR** ondansetron (ZOFRAN) IV, traZODone  Physical Exam Pulmonary:     Effort: Pulmonary effort is normal.  Neurological:     Mental Status: She is alert.            Vital Signs: BP 138/82 (BP Location: Left Arm)   Pulse 75   Temp 97.8 F (36.6 C) (Oral)   Resp  18   Ht 5\' 3"  (1.6 m)   Wt 52.1 kg   LMP  (LMP Unknown)   SpO2 100%   BMI 20.35 kg/m  SpO2: SpO2: 100 % O2 Device: O2 Device: Room Air O2 Flow Rate:    Intake/output summary:  Intake/Output Summary (Last 24 hours) at 02/25/2021 1210 Last data filed at 02/25/2021 0900 Gross per 24 hour  Intake 2778.8 ml  Output 15 ml  Net 2763.8 ml   LBM: Last BM Date: 02/22/21 Baseline Weight: Weight: 55.8 kg Most recent weight: Weight: 52.1 kg        Patient Active Problem List   Diagnosis Date Noted   Choledocholithiasis    Acute pancreatitis 02/19/2021   CKD stage 3 due to type 2 diabetes mellitus ([place])    Cholelithiasis    Osteoporosis 01/22/2021   Pancreatitis, acute 12/25/2020   Pancreatitis 12/25/2020   Dementia without behavioral disturbance ([place])    Stroke ([place]) 07/22/2020   TIA (transient ischemic attack) 07/22/2020   Depression 07/22/2020   Visual hallucinations 07/21/2020   NSTEMI (non-ST elevated myocardial infarction) ([place]) 04/17/2020   Change in mental status 04/15/2020   Head injury 04/15/2020   Dysuria 04/30/2019   Frequent urinary tract infections 04/30/2019   Age-related osteoporosis without current pathological fracture 03/01/2019   Dizziness 08/29/2018   UTI (urinary tract infection) 08/15/2018   Vitamin D deficiency, unspecified 06/16/2017   Hypercalcinuria 06/14/2017   SCC (squamous cell carcinoma), face 03/29/2017   History of kidney stones 03/12/2017   Breast cancer, left ([place]) 12/28/2016   Chronic cystitis 11/05/2016   Nephrolithiasis 94/49/6759   Renal colic 16/38/4665   Urge incontinence 11/05/2016   Bilateral hand pain 11/03/2016   Generalized  osteoarthritis of hand 11/03/2016   Numbness and tingling in both hands 11/03/2016   Bradycardia 04/15/2016   History of colonic polyps 09/16/2015   Abdominal pain 08/18/2015   Rectal bleeding 08/18/2015   CAD (coronary artery disease) 02/17/2015   Atrial fibrillation ([place]) 02/17/2015   Diabetes mellitus with neuropathy ([place]) 02/17/2015   Hyperlipidemia 02/17/2015   Iron deficiency anemia 02/17/2015   Macular degeneration 02/17/2015   Stress 02/17/2015   Health care maintenance 02/17/2015   Diarrhea 02/17/2015   Type II diabetes mellitus ([place]) 07/13/2014   Diverticulosis 03/31/2013   GI bleeding 03/31/2013   Mitral valve prolapse 03/31/2013   Diverticulosis of intestine without perforation or abscess without bleeding 03/31/2013    Palliative Care Assessment & Plan    Recommendations/Plan: Given her age and frailty, I would recommend continuing Tylenol for mild pain. I would recommend changing Morphine IV for severe pain to Norco 1 tab q 6 hours for moderate pain,  and 2 tabs q 6 hours for severe pain during the first 3 days post op. Could then change to Ultram for moderate pain and Norco for severe pain.   Recommend palliative to follow at D/C.     Code Status:    Code Status Orders  (From admission, onward)           Start     Ordered   02/19/21 0825  Do not attempt resuscitation (DNR)  Continuous       Question Answer Comment  In the event of cardiac or respiratory ARREST Do not call a "code blue"   In the event of cardiac or respiratory ARREST Do not perform Intubation, CPR, defibrillation or ACLS   In the event of cardiac or respiratory ARREST Use medication by any route, position, wound care, and  other measures to relive pain and suffering. May use oxygen, suction and manual treatment of airway obstruction as needed for comfort.   Comments CODE STATUS was discussed with patient's healthcare power of attorney who was at the bedside and she is a DNR.      02/19/21  0827           Code Status History     Date Active Date Inactive Code Status Order ID Comments User Context   12/25/2020 1830 12/28/2020 1644 DNR [id number]  Domenic Polite, MD ED   07/22/2020 1736 07/23/2020 1945 Full Code [id number]  Ivor Costa, MD Inpatient   07/22/2020 1309 07/22/2020 1736 DNR [id number]  Ivor Costa, MD ED   04/17/2020 0227 04/18/2020 1707 Full Code [id number]  Athena Masse, MD ED   08/15/2018 2255 08/17/2018 1644 Full Code [id number]  Lance Coon, MD Inpatient        Thank you for allowing the Palliative Medicine Team to assist in the care of this patient.       Total Time 15 min Prolonged Time Billed  no       Greater than 50%  of this time was spent counseling and coordinating care related to the above assessment and plan.  Asencion Gowda, NP  Please contact Palliative Medicine Team phone at [phone] for questions and concerns.

## 2021-02-26 LAB — SURGICAL PATHOLOGY

## 2021-02-26 LAB — GLUCOSE, CAPILLARY
Glucose-Capillary: 117 mg/dL — ABNORMAL HIGH (ref 70–99)
Glucose-Capillary: 148 mg/dL — ABNORMAL HIGH (ref 70–99)

## 2021-02-26 NOTE — Progress Notes (Signed)
Mobility Specialist - Progress Note   02/26/21 1200  Mobility  Activity Ambulated in hall  Level of Assistance Standby assist, set-up cues, supervision of patient - no hands on  Assistive Device Front wheel walker  Distance Ambulated (ft) 50 ft  Mobility Ambulated with assistance in hallway  Mobility Response Tolerated well  Mobility performed by Mobility specialist  $Mobility charge 1 Mobility    Pt lying supine upon arrival, utilizing RA. Abdominal pain, log-rolling method to sit EOB. No dizziness. Pt began ambulation into hallway then voiced increased pain in abdomen 8/10 and requested to return to bed. HR 65 bpm, O2 97%. Pt left in bed with alarm set. Lunch arrived at the end of session and was placed in front of pt.    Kathee Delton Mobility Specialist 02/26/21, 12:14 PM

## 2021-02-26 NOTE — Discharge Summary (Signed)
Physician Discharge Summary  Holly Rose:[id number] DOB: 1930/01/17 DOA: 02/19/2021  PCP: [name], MD  Admit date: 02/19/2021 Discharge date: 02/26/2021  Admitted From: ALF Disposition: ALF  Recommendations for Outpatient Follow-up:  Follow up with PCP in 1-2 weeks Follow-up general surgery 1 week  Home Health: Yes Equipment/Devices: No  Discharge Condition: Stable CODE STATUS: DNR Diet recommendation: Regular  Brief/Interim Summary: 87 y.o. female with medical history significant for early dementia, type 2 diabetes mellitus, breast cancer, CVA, cholelithiasis, history of gallstone pancreatitis and A. fib who presented to the ED for evaluation of abdominal pain.  Status post MRCP.  Evidence of choledocholithiasis and recurrent gallstone pancreatitis.  Patient status post ERCP and removal sick stones on 6/10.  Follow-up laparoscopic cholecystectomy on 6/13.  Tolerated both procedures well.  Having some postoperative pain but relatively well controlled.  On day of discharge patient has no complaints.  Lengthy conversation with the daughter at bedside.  Physical therapy and Occupational Therapy will be arranged to the assisted living for facility.  I have ordered home health services to supplement as well.  Patient will follow up with general surgery within 1 week of discharge.  No specific wound care at ALF.  General surgery will evaluate wound and do dressing change in the office.  Patient is also had issues with delirium while admitted.  I suspect this to be secondary to hospital-acquired delirium in the background of dementia.  I explained to the daughter that this is will improve in more familiar environment however may take days to weeks.  Daughter expressed understanding and appreciation for care.  Discharge Diagnoses:  Principal Problem:   Acute pancreatitis Active Problems:   CAD (coronary artery disease)   Atrial fibrillation (HCC)   Breast cancer, left (HCC)    Depression   Dementia without behavioral disturbance (HCC)   CKD stage 3 due to type 2 diabetes mellitus ([place])   Cholelithiasis   Choledocholithiasis  Acute gallstone pancreatitis Recurrent S/p ERCP and removal of 6 stones per Dr. Allen Norris on 6/10 Status post lap chole 6/13 Tolerated procedure well Moderate postoperative pain Pain well controlled at time of discharge.  Stable for discharge back to assisted living.   History of atrial fibrillation Metoprolol and apixaban resumed   Diabetes mellitus with complications of stage IIIa chronic kidney disease Can resume home regimen   Coronary artery disease Resume home regimen of beta-blocker, nitrates, statin   History of breast cancer Continue tamoxifen   Depression Continue Remeron  Discharge Instructions  Discharge Instructions     Diet - low sodium heart healthy   Complete by: As directed    Increase activity slowly   Complete by: As directed    No wound care   Complete by: As directed       Allergies as of 02/26/2021       Reactions   Lyrica [pregabalin] Swelling   Sleepy, does not eat   Neurontin [gabapentin] Nausea And Vomiting, Other (See Comments)   disoriented   Bactrim [sulfamethoxazole-trimethoprim]    Made her dizziness        Medication List     TAKE these medications    acetaminophen 325 MG tablet Commonly known as: TYLENOL Take 2 tablets (650 mg total) by mouth every 6 (six) hours as needed for mild pain (or Fever >/= 101).   Aflibercept 2 MG/0.05ML Soln 1 each by Intravitreal route every 8 (eight) weeks.   apixaban 2.5 MG Tabs tablet Commonly known as: ELIQUIS Take 1  tablet (2.5 mg total) by mouth 2 (two) times daily.   atorvastatin 40 MG tablet Commonly known as: LIPITOR Take 1 tablet (40 mg total) by mouth daily.   blood glucose meter kit and supplies Per patient please dispense One touch Ultra2 meter.Use meter and supplies three times a day to check blood sugar. ICD10: E11.40    divalproex 250 MG 24 hr tablet Commonly known as: DEPAKOTE ER Take 250 mg by mouth daily. What changed: Another medication with the same name was removed. Continue taking this medication, and follow the directions you see here.   freestyle lancets 2 (two) times daily.   FreeStyle Lite Devi 2 (two) times daily.   FREESTYLE LITE test strip Generic drug: glucose blood Used to check blood sugars twice a day. DX E11.9   furosemide 20 MG tablet Commonly known as: LASIX Take 1 tablet (20 mg total) by mouth daily.   HAIR/SKIN/NAILS PO Take 3 tablets by mouth daily. 2 in the am, 1 in the pm   HYDROcodone-acetaminophen 5-325 MG tablet Commonly known as: Norco Take 1 tablet by mouth every 4 (four) hours as needed for up to 3 days for moderate pain.   isosorbide mononitrate 30 MG 24 hr tablet Commonly known as: IMDUR Take 1 tablet (30 mg total) by mouth daily.   Magnesium 200 MG Tabs Take 200 mg by mouth 2 (two) times a day.   meclizine 12.5 MG tablet Commonly known as: ANTIVERT Take 1 tablet (12.5 mg total) by mouth 2 (two) times daily as needed for dizziness.   metFORMIN 500 MG 24 hr tablet Commonly known as: GLUCOPHAGE-XR Take 1 tablet by mouth twice daily   metoprolol tartrate 25 MG tablet Commonly known as: LOPRESSOR Take 25 mg by mouth 2 (two) times daily.   mirtazapine 15 MG tablet Commonly known as: REMERON Take 15 mg by mouth at bedtime.   mupirocin ointment 2 % Commonly known as: BACTROBAN Apply 1 application topically daily.   OVER THE COUNTER MEDICATION Take 2 tablets by mouth in the morning and at bedtime. Energizing iron tablets   PRESERVISION AREDS 2 PO Take 1 tablet by mouth 2 (two) times daily.   PROBIOTIC DAILY PO Take by mouth.   Prolia 60 MG/ML Sosy injection Generic drug: denosumab Inject 60 mg into the skin every 6 (six) months.   SYSTANE OP Apply 1 drop to eye 2 (two) times daily.   tamoxifen 20 MG tablet Commonly known as:  NOLVADEX Take 1 tablet (20 mg total) by mouth daily.   vitamin B-12 500 MCG tablet Commonly known as: CYANOCOBALAMIN Take 500 mcg by mouth 2 (two) times a day.   Vitamin D 50 MCG (2000 UT) Caps Take 4,000 Units by mouth daily.               Durable Medical Equipment  (From admission, onward)           Start     Ordered   02/26/21 0909  For home use only DME Walker rolling  Once       Question Answer Comment  Walker: With 5 Inch Wheels   Patient needs a walker to treat with the following condition Weakness      02/26/21 0908            Follow-up Information     Byrnett, Forest Gleason, MD. Go in 6 day(s).   Specialties: General Surgery, Radiology Why: Monday, June 20 at 11 AM in the office. Contact information: [place]  [place] [address] [phone]         Herbert Pun, MD Follow up in 2 week(s).   Specialty: General Surgery Contact information: [place] [address] [phone]         [name], MD. Schedule an appointment as soon as possible for a visit in 1 week(s).   Specialty: Internal Medicine Contact information: [address] [place] [address] [phone]                Allergies  Allergen Reactions   Lyrica [Pregabalin] Swelling    Sleepy, does not eat   Neurontin [Gabapentin] Nausea And Vomiting and Other (See Comments)    disoriented   Bactrim [Sulfamethoxazole-Trimethoprim]     Made her dizziness    Consultations: GI General surgery   Procedures/Studies: DG C-Arm 1-60 Min-No Report  Result Date: 02/21/2021 Fluoroscopy was utilized by the requesting physician.  No radiographic interpretation.   US ABDOMEN LIMITED RUQ (LIVER/GB)  Result Date: 02/19/2021 CLINICAL DATA:  Pancreatitis. EXAM: ULTRASOUND ABDOMEN LIMITED RIGHT UPPER QUADRANT COMPARISON:  Ultrasound 12/25/2020.  CT 12/25/2020. FINDINGS: Gallbladder: Multiple gallstones  noted. 4 mm stone noted in the gallbladder neck. Gallbladder slightly distended. Gallbladder wall thickness normal. Negative Murphy sign. Small amount of free fluid noted about the gallbladder. Common bile duct: Diameter: 6.6 mm. Multiple tiny gallstones measuring up to 5 mm gallstones noted in the common bile duct. Liver: No focal lesion identified. Within normal limits in parenchymal echogenicity. Portal vein is patent on color Doppler imaging with normal direction of blood flow towards the liver. Other: None. IMPRESSION: 1. Multiple gallstones noted. 4 mm stone noted in the gallbladder neck. Gallbladder slightly distended. Gallbladder wall thickness normal. Negative Murphy sign. Small amount of fluid noted about the gallbladder. 2. Multiple tiny gallstones noted in the common bile duct. Common bile duct is normal in caliber 6.6 mm. Electronically Signed   By: Marcello Moores  Register   On: 02/19/2021 06:17   (Echo, Carotid, EGD, Colonoscopy, ERCP)    Subjective: Seen and examined on the day of discharge.  Stable, no distress.  Pain well controlled.  Stable for discharge back to assisted living.  Discharge Exam: Vitals:   02/26/21 1048 02/26/21 1156  BP: 137/64 (!) 152/91  Pulse: 72 91  Resp: 15 16  Temp: (!) 97.4 F (36.3 C) 98.9 F (37.2 C)  SpO2: 99% 100%   Vitals:   02/25/21 2200 02/26/21 0426 02/26/21 1048 02/26/21 1156  BP: 128/66 (!) 165/92 137/64 (!) 152/91  Pulse: 88 87 72 91  Resp: 20 20 15 16   Temp: 98 F (36.7 C) 97.9 F (36.6 C) (!) 97.4 F (36.3 C) 98.9 F (37.2 C)  TempSrc: Oral Oral Oral   SpO2: 98% 100% 99% 100%  Weight:      Height:        General: Pt is alert, awake, not in acute distress Cardiovascular: RRR, S1/S2 +, no rubs, no gallops Respiratory: CTA bilaterally, no wheezing, no rhonchi Abdominal: Soft nondistended, mild tender to palpation, positive bowel sounds Extremities: no edema, no cyanosis    The results of significant diagnostics from this  hospitalization (including imaging, microbiology, ancillary and laboratory) are listed below for reference.     Microbiology: Recent Results (from the past 240 hour(s))  SARS CORONAVIRUS 2 (TAT 6-24 HRS) Nasopharyngeal Nasopharyngeal Swab     Status: None   Collection Time: 02/19/21  6:18 AM   Specimen: Nasopharyngeal Swab  Result Value Ref Range  Status   SARS Coronavirus 2 NEGATIVE NEGATIVE Final    Comment: (NOTE) SARS-CoV-2 target nucleic acids are NOT DETECTED.  The SARS-CoV-2 RNA is generally detectable in upper and lower respiratory specimens during the acute phase of infection. Negative results do not preclude SARS-CoV-2 infection, do not rule out co-infections with other pathogens, and should not be used as the sole basis for treatment or other patient management decisions. Negative results must be combined with clinical observations, patient history, and epidemiological information. The expected result is Negative.  Fact Sheet for Patients: SugarRoll.be  Fact Sheet for Healthcare Providers: https://www.woods-mathews.com/  This test is not yet approved or cleared by the [place] FDA and  has been authorized for detection and/or diagnosis of SARS-CoV-2 by FDA under an Emergency Use Authorization (EUA). This EUA will remain  in effect (meaning this test can be used) for the duration of the COVID-19 declaration under Se ction 564(b)(1) of the Act, 21 U.S.C. section 360bbb-3(b)(1), unless the authorization is terminated or revoked sooner.  Performed at [facility], [place] [address] [place], [place] [address]      Labs: BNP (last 3 results) No results for input(s): BNP in the last 8760 hours. Basic Metabolic Panel: Recent Labs  Lab 02/20/21 0544 02/21/21 0432 02/22/21 0446 02/23/21 0506 02/25/21 0543  NA 136 139 139 140 140  K 3.5 3.9 3.9 4.3 4.0  CL 106 109 113* 114* 110  CO2 26 23 24 24 25   GLUCOSE 143*  119* 84 95 143*  BUN 16 10 9  6* 10  CREATININE 0.63 0.57 0.56 0.59 0.60  CALCIUM 8.5* 8.6* 8.2* 8.3* 7.5*   Liver Function Tests: Recent Labs  Lab 02/20/21 0544 02/21/21 0432 02/22/21 0446 02/23/21 0506 02/25/21 0543  AST 48* 36 25 22 35  ALT 34 28 22 19 22   ALKPHOS 39 40 37* 35* 35*  BILITOT 0.6 0.6 0.6 0.5 0.7  PROT 6.4* 7.4 6.3* 6.1* 6.1*  ALBUMIN 2.7* 3.1* 2.7* 2.5* 2.5*   Recent Labs  Lab 02/20/21 0544  LIPASE 368*   No results for input(s): AMMONIA in the last 168 hours. CBC: Recent Labs  Lab 02/20/21 0544 02/21/21 0432 02/22/21 0446 02/23/21 0506 02/25/21 0543  WBC 7.6 6.7 5.6 3.7* 6.9  HGB 10.4* 11.4* 10.1* 10.1* 9.5*  HCT 33.0* 34.9* 30.8* 30.8* 29.5*  MCV 93.0 91.6 89.8 91.1 92.8  PLT 138* 163 135* 129* 137*   Cardiac Enzymes: No results for input(s): CKTOTAL, CKMB, CKMBINDEX, TROPONINI in the last 168 hours. BNP: Invalid input(s): POCBNP CBG: Recent Labs  Lab 02/25/21 1127 02/25/21 1628 02/25/21 2158 02/26/21 0745 02/26/21 1152  GLUCAP 124* 103* 167* 117* 148*   D-Dimer No results for input(s): DDIMER in the last 72 hours. Hgb A1c No results for input(s): HGBA1C in the last 72 hours. Lipid Profile No results for input(s): CHOL, HDL, LDLCALC, TRIG, CHOLHDL, LDLDIRECT in the last 72 hours. Thyroid function studies No results for input(s): TSH, T4TOTAL, T3FREE, THYROIDAB in the last 72 hours.  Invalid input(s): FREET3 Anemia work up No results for input(s): VITAMINB12, FOLATE, FERRITIN, TIBC, IRON, RETICCTPCT in the last 72 hours. Urinalysis    Component Value Date/Time   COLORURINE YELLOW (A) 02/21/2021 1129   APPEARANCEUR HAZY (A) 02/21/2021 1129   APPEARANCEUR Clear 06/07/2019 0940   LABSPEC 1.012 02/21/2021 1129   PHURINE 7.0 02/21/2021 1129   GLUCOSEU NEGATIVE 02/21/2021 1129   GLUCOSEU NEGATIVE 01/28/2021 1454   HGBUR NEGATIVE 02/21/2021 1129   [place]  02/21/2021 1129   BILIRUBINUR Negative 06/07/2019 0940    KETONESUR NEGATIVE 02/21/2021 1129   PROTEINUR NEGATIVE 02/21/2021 1129   UROBILINOGEN 0.2 01/28/2021 1454   NITRITE POSITIVE (A) 02/21/2021 1129   LEUKOCYTESUR LARGE (A) 02/21/2021 1129   Sepsis Labs Invalid input(s): PROCALCITONIN,  WBC,  LACTICIDVEN Microbiology Recent Results (from the past 240 hour(s))  SARS CORONAVIRUS 2 (TAT 6-24 HRS) Nasopharyngeal Nasopharyngeal Swab     Status: None   Collection Time: 02/19/21  6:18 AM   Specimen: Nasopharyngeal Swab  Result Value Ref Range Status   SARS Coronavirus 2 NEGATIVE NEGATIVE Final    Comment: (NOTE) SARS-CoV-2 target nucleic acids are NOT DETECTED.  The SARS-CoV-2 RNA is generally detectable in upper and lower respiratory specimens during the acute phase of infection. Negative results do not preclude SARS-CoV-2 infection, do not rule out co-infections with other pathogens, and should not be used as the sole basis for treatment or other patient management decisions. Negative results must be combined with clinical observations, patient history, and epidemiological information. The expected result is Negative.  Fact Sheet for Patients: SugarRoll.be  Fact Sheet for Healthcare Providers: https://www.woods-mathews.com/  This test is not yet approved or cleared by the [place] FDA and  has been authorized for detection and/or diagnosis of SARS-CoV-2 by FDA under an Emergency Use Authorization (EUA). This EUA will remain  in effect (meaning this test can be used) for the duration of the COVID-19 declaration under Se ction 564(b)(1) of the Act, 21 U.S.C. section 360bbb-3(b)(1), unless the authorization is terminated or revoked sooner.  Performed at [facility], [place] [address], [place], [place] [address]      Time coordinating discharge: Over 30 minutes  SIGNED:   Sidney Ace, MD  [facility] 02/26/2021, 1:50 PM Pager   If 7PM-7AM, please contact  night-coverage

## 2021-02-26 NOTE — Progress Notes (Signed)
Scalp wound tegaderm is intact. Left shoulder donor site dressing intact. Will see the patient in office next week for dressing change.

## 2021-02-26 NOTE — TOC Initial Note (Addendum)
Transition of Care [facility]) - Initial/Assessment Note    Patient Details  Name: Holly Rose MRN: [id number] Date of Birth: 1929/11/23  [facility] CM/SW Contact:    Beverly Sessions, RN Phone Number: 02/26/2021, 10:17 AM  Clinical Narrative:                 Patient to discharge today Patient live at independent living [place], with her husband  Daughter in Sports coach [name] lives locally and assist with taking patient to appointments  PT has assessed patient and recommends home health. Patient has RW at home  Family initially was considering using patient's 14 respite days at [facility].  They have decided for patient to return to her independent living with home health services.   [name] states that she does not have preference of home health agency.  Per Maudie Mercury at the [facility] their preference is [facility].  Referral made and accepted by Corene Cornea with [facility] to transport at discharge   Per MD no wound care required at discharge   Expected Discharge Plan: [place] Barriers to Discharge: No Barriers Identified   Patient Goals and CMS Choice Patient states their goals for this hospitalization and ongoing recovery are:: TBD after procedure on Monday CMS Medicare.gov Compare Post Acute Care list provided to:: Patient Represenative (must comment) Choice offered to / list presented to : [facility] POA / Guardian, Adult Children  Expected Discharge Plan and Services Expected Discharge Plan: [place]       Living arrangements for the past 2 months: [place]                           HH Arranged: RN, PT, OT, Nurse's Aide          Prior Living Arrangements/Services Living arrangements for the past 2 months: [place] Lives with:: Spouse Patient language and need for interpreter reviewed:: Yes Do you feel safe going back to the place where you live?: Yes      Need  for Family Participation in Patient Care: Yes (Comment) Care giver support system in place?: Yes (comment) Current home services: DME Criminal Activity/Legal Involvement Pertinent to Current Situation/Hospitalization: No - Comment as needed  Activities of Daily Living Home Assistive Devices/Equipment: Eyeglasses, Gilford Rile (specify type) ADL Screening (condition at time of admission) Patient's cognitive ability adequate to safely complete daily activities?: Yes Is the patient deaf or have difficulty hearing?: No Does the patient have difficulty seeing, even when wearing glasses/contacts?: No Does the patient have difficulty concentrating, remembering, or making decisions?: Yes Patient able to express need for assistance with ADLs?: Yes Does the patient have difficulty dressing or bathing?: Yes Independently performs ADLs?: No Communication: Independent Dressing (OT): Needs assistance Is this a change from baseline?: Change from baseline, expected to last >3 days Grooming: Needs assistance Is this a change from baseline?: Change from baseline, expected to last >3 days Feeding: Independent Bathing: Needs assistance Is this a change from baseline?: Change from baseline, expected to last >3 days Toileting: Needs assistance Is this a change from baseline?: Change from baseline, expected to last >3days In/Out Bed: Needs assistance Is this a change from baseline?: Change from baseline, expected to last >3 days Walks in Home: Needs assistance Is this a change from baseline?: Change from baseline, expected to last >3 days Does the patient have difficulty walking or climbing  stairs?: Yes Weakness of Legs: Both Weakness of Arms/Hands: None  Permission Sought/Granted Permission sought to share information with : Facility Art therapist granted to share information with : Yes, Verbal Permission Granted     Permission granted to share info w AGENCY: [place],  [place]        Emotional Assessment       Orientation: : Fluctuating Orientation (Suspected and/or reported [place]) Alcohol / Substance Use: Not Applicable Psych Involvement: No (comment)  Admission diagnosis:  Acute pancreatitis [K85.90] Pancreatitis [K85.90] Acute gallstone pancreatitis [K85.10] Patient Active Problem List   Diagnosis Date Noted   Choledocholithiasis    Acute pancreatitis 02/19/2021   CKD stage 3 due to type 2 diabetes mellitus ([place])    Cholelithiasis    Osteoporosis 01/22/2021   Pancreatitis, acute 12/25/2020   Pancreatitis 12/25/2020   Dementia without behavioral disturbance ([place])    Stroke ([place]) 07/22/2020   TIA (transient ischemic attack) 07/22/2020   Depression 07/22/2020   Visual hallucinations 07/21/2020   NSTEMI (non-ST elevated myocardial infarction) ([place]) 04/17/2020   Change in mental status 04/15/2020   Head injury 04/15/2020   Dysuria 04/30/2019   Frequent urinary tract infections 04/30/2019   Age-related osteoporosis without current pathological fracture 03/01/2019   Dizziness 08/29/2018   UTI (urinary tract infection) 08/15/2018   Vitamin D deficiency, unspecified 06/16/2017   Hypercalcinuria 06/14/2017   SCC (squamous cell carcinoma), face 03/29/2017   History of kidney stones 03/12/2017   Breast cancer, left ([place]) 12/28/2016   Chronic cystitis 11/05/2016   Nephrolithiasis 92/07/9416   Renal colic 40/81/4481   Urge incontinence 11/05/2016   Bilateral hand pain 11/03/2016   Generalized osteoarthritis of hand 11/03/2016   Numbness and tingling in both hands 11/03/2016   Bradycardia 04/15/2016   History of colonic polyps 09/16/2015   Abdominal pain 08/18/2015   Rectal bleeding 08/18/2015   CAD (coronary artery disease) 02/17/2015   Atrial fibrillation ([place]) 02/17/2015   Diabetes mellitus with neuropathy ([place]) 02/17/2015   Hyperlipidemia 02/17/2015   Iron deficiency anemia 02/17/2015   Macular  degeneration 02/17/2015   Stress 02/17/2015   Health care maintenance 02/17/2015   Diarrhea 02/17/2015   Type II diabetes mellitus ([place]) 07/13/2014   Diverticulosis 03/31/2013   GI bleeding 03/31/2013   Mitral valve prolapse 03/31/2013   Diverticulosis of intestine without perforation or abscess without bleeding 03/31/2013   PCP:  [name], MD Pharmacy:   [facility] [address] [place] - [address] [place] [address] Phone: [phone] Fax: [phone]     Social Determinants of Health (SDOH) Interventions    Readmission Risk Interventions Readmission Risk Prevention Plan 02/26/2021 02/22/2021  Transportation Screening Complete Complete  PCP or Specialist Appt within 3-5 Days - Complete  HRI or [place] - Complete  Social Work Consult for [place] Planning/Counseling - Complete  Palliative Care Screening - Complete  Medication Review Press photographer) Complete Complete  HRI or Home Care Consult Complete -  Palliative Care Screening Not Applicable -  [place] Not Applicable -  Some recent data might be hidden

## 2021-02-27 ENCOUNTER — Telehealth: Payer: Self-pay

## 2021-02-27 NOTE — Telephone Encounter (Signed)
No Transition of Care scheduled at this time. Spoke with daughter and notes patient is recovering well and now at home with care. HFU declined with PCP,  however plans to keep scheduled 3 month appt in August or sooner as needed. Scheduled to f/u with Surgeon next week. Agrees to notify office of any new questions or concerns.

## 2021-02-28 ENCOUNTER — Encounter: Payer: Self-pay | Admitting: Internal Medicine

## 2021-02-28 DIAGNOSIS — Z87442 Personal history of urinary calculi: Secondary | ICD-10-CM | POA: Diagnosis not present

## 2021-02-28 DIAGNOSIS — Z853 Personal history of malignant neoplasm of breast: Secondary | ICD-10-CM | POA: Diagnosis not present

## 2021-02-28 DIAGNOSIS — H3530 Unspecified macular degeneration: Secondary | ICD-10-CM | POA: Diagnosis not present

## 2021-02-28 DIAGNOSIS — I4891 Unspecified atrial fibrillation: Secondary | ICD-10-CM | POA: Diagnosis not present

## 2021-02-28 DIAGNOSIS — F32A Depression, unspecified: Secondary | ICD-10-CM | POA: Diagnosis not present

## 2021-02-28 DIAGNOSIS — Z48815 Encounter for surgical aftercare following surgery on the digestive system: Secondary | ICD-10-CM | POA: Diagnosis not present

## 2021-02-28 DIAGNOSIS — Z90710 Acquired absence of both cervix and uterus: Secondary | ICD-10-CM | POA: Diagnosis not present

## 2021-02-28 DIAGNOSIS — Z87891 Personal history of nicotine dependence: Secondary | ICD-10-CM | POA: Diagnosis not present

## 2021-02-28 DIAGNOSIS — D631 Anemia in chronic kidney disease: Secondary | ICD-10-CM | POA: Diagnosis not present

## 2021-02-28 DIAGNOSIS — E785 Hyperlipidemia, unspecified: Secondary | ICD-10-CM | POA: Diagnosis not present

## 2021-02-28 DIAGNOSIS — Z95810 Presence of automatic (implantable) cardiac defibrillator: Secondary | ICD-10-CM | POA: Diagnosis not present

## 2021-02-28 DIAGNOSIS — M1990 Unspecified osteoarthritis, unspecified site: Secondary | ICD-10-CM | POA: Diagnosis not present

## 2021-02-28 DIAGNOSIS — N1831 Chronic kidney disease, stage 3a: Secondary | ICD-10-CM | POA: Diagnosis not present

## 2021-02-28 DIAGNOSIS — I2510 Atherosclerotic heart disease of native coronary artery without angina pectoris: Secondary | ICD-10-CM | POA: Diagnosis not present

## 2021-02-28 DIAGNOSIS — F0390 Unspecified dementia without behavioral disturbance: Secondary | ICD-10-CM | POA: Diagnosis not present

## 2021-02-28 DIAGNOSIS — L989 Disorder of the skin and subcutaneous tissue, unspecified: Secondary | ICD-10-CM | POA: Diagnosis not present

## 2021-02-28 DIAGNOSIS — E1140 Type 2 diabetes mellitus with diabetic neuropathy, unspecified: Secondary | ICD-10-CM | POA: Diagnosis not present

## 2021-02-28 DIAGNOSIS — Z9089 Acquired absence of other organs: Secondary | ICD-10-CM | POA: Diagnosis not present

## 2021-02-28 DIAGNOSIS — K858 Other acute pancreatitis without necrosis or infection: Secondary | ICD-10-CM | POA: Diagnosis not present

## 2021-02-28 DIAGNOSIS — Z9049 Acquired absence of other specified parts of digestive tract: Secondary | ICD-10-CM | POA: Diagnosis not present

## 2021-02-28 DIAGNOSIS — Z85828 Personal history of other malignant neoplasm of skin: Secondary | ICD-10-CM | POA: Diagnosis not present

## 2021-02-28 DIAGNOSIS — Z8673 Personal history of transient ischemic attack (TIA), and cerebral infarction without residual deficits: Secondary | ICD-10-CM | POA: Diagnosis not present

## 2021-02-28 DIAGNOSIS — E1122 Type 2 diabetes mellitus with diabetic chronic kidney disease: Secondary | ICD-10-CM | POA: Diagnosis not present

## 2021-02-28 DIAGNOSIS — I252 Old myocardial infarction: Secondary | ICD-10-CM | POA: Diagnosis not present

## 2021-02-28 DIAGNOSIS — Z950 Presence of cardiac pacemaker: Secondary | ICD-10-CM | POA: Diagnosis not present

## 2021-02-28 NOTE — Telephone Encounter (Signed)
My chart response sent to [facility].

## 2021-03-03 ENCOUNTER — Encounter: Admission: RE | Payer: Self-pay | Source: Home / Self Care

## 2021-03-03 ENCOUNTER — Ambulatory Visit: Admission: RE | Admit: 2021-03-03 | Payer: Medicare Other | Source: Home / Self Care | Admitting: General Surgery

## 2021-03-03 SURGERY — EXCISION MASS
Anesthesia: Monitor Anesthesia Care | Laterality: Left

## 2021-03-04 ENCOUNTER — Telehealth: Payer: Self-pay

## 2021-03-04 NOTE — Telephone Encounter (Signed)
Holly Rose with [facility] called and states that pt's daughter in law declined OT eval-FYI

## 2021-03-04 NOTE — Telephone Encounter (Signed)
FYI

## 2021-03-05 DIAGNOSIS — N1831 Chronic kidney disease, stage 3a: Secondary | ICD-10-CM | POA: Diagnosis not present

## 2021-03-05 DIAGNOSIS — K858 Other acute pancreatitis without necrosis or infection: Secondary | ICD-10-CM | POA: Diagnosis not present

## 2021-03-05 DIAGNOSIS — E1122 Type 2 diabetes mellitus with diabetic chronic kidney disease: Secondary | ICD-10-CM | POA: Diagnosis not present

## 2021-03-05 DIAGNOSIS — D631 Anemia in chronic kidney disease: Secondary | ICD-10-CM | POA: Diagnosis not present

## 2021-03-05 DIAGNOSIS — Z48815 Encounter for surgical aftercare following surgery on the digestive system: Secondary | ICD-10-CM | POA: Diagnosis not present

## 2021-03-05 DIAGNOSIS — I2510 Atherosclerotic heart disease of native coronary artery without angina pectoris: Secondary | ICD-10-CM | POA: Diagnosis not present

## 2021-03-07 DIAGNOSIS — I2510 Atherosclerotic heart disease of native coronary artery without angina pectoris: Secondary | ICD-10-CM | POA: Diagnosis not present

## 2021-03-07 DIAGNOSIS — Z48815 Encounter for surgical aftercare following surgery on the digestive system: Secondary | ICD-10-CM | POA: Diagnosis not present

## 2021-03-07 DIAGNOSIS — D631 Anemia in chronic kidney disease: Secondary | ICD-10-CM | POA: Diagnosis not present

## 2021-03-07 DIAGNOSIS — K858 Other acute pancreatitis without necrosis or infection: Secondary | ICD-10-CM | POA: Diagnosis not present

## 2021-03-07 DIAGNOSIS — E1122 Type 2 diabetes mellitus with diabetic chronic kidney disease: Secondary | ICD-10-CM | POA: Diagnosis not present

## 2021-03-07 DIAGNOSIS — N1831 Chronic kidney disease, stage 3a: Secondary | ICD-10-CM | POA: Diagnosis not present

## 2021-03-12 DIAGNOSIS — D631 Anemia in chronic kidney disease: Secondary | ICD-10-CM | POA: Diagnosis not present

## 2021-03-12 DIAGNOSIS — I2510 Atherosclerotic heart disease of native coronary artery without angina pectoris: Secondary | ICD-10-CM | POA: Diagnosis not present

## 2021-03-12 DIAGNOSIS — N1831 Chronic kidney disease, stage 3a: Secondary | ICD-10-CM | POA: Diagnosis not present

## 2021-03-12 DIAGNOSIS — Z48815 Encounter for surgical aftercare following surgery on the digestive system: Secondary | ICD-10-CM | POA: Diagnosis not present

## 2021-03-12 DIAGNOSIS — K858 Other acute pancreatitis without necrosis or infection: Secondary | ICD-10-CM | POA: Diagnosis not present

## 2021-03-12 DIAGNOSIS — E1122 Type 2 diabetes mellitus with diabetic chronic kidney disease: Secondary | ICD-10-CM | POA: Diagnosis not present

## 2021-03-13 ENCOUNTER — Other Ambulatory Visit: Payer: Self-pay | Admitting: Internal Medicine

## 2021-03-18 ENCOUNTER — Encounter: Payer: Self-pay | Admitting: Internal Medicine

## 2021-03-18 DIAGNOSIS — R300 Dysuria: Secondary | ICD-10-CM

## 2021-03-18 NOTE — Telephone Encounter (Signed)
Confirmed no acute symptoms. No confusion, pain, fever, etc. Her care giver noted that her urine has a foul smell and she has had two episodes of incontinence today. [name] would like to come pick up a urine cup just to confirm that she does not have a UTI. [name] did agree to take her to acute care if she develops any acute symptoms. Are you okay with her coming to get a cup and collecting a sample tomorrow?

## 2021-03-18 NOTE — Telephone Encounter (Signed)
As we discussed, ok with checking urine and agree with need for evaluation if symptoms change, progress, etc.

## 2021-03-19 ENCOUNTER — Other Ambulatory Visit (INDEPENDENT_AMBULATORY_CARE_PROVIDER_SITE_OTHER): Payer: Medicare Other

## 2021-03-19 ENCOUNTER — Other Ambulatory Visit: Payer: Self-pay

## 2021-03-19 DIAGNOSIS — R300 Dysuria: Secondary | ICD-10-CM

## 2021-03-19 LAB — URINALYSIS, ROUTINE W REFLEX MICROSCOPIC
Bilirubin Urine: NEGATIVE
Hgb urine dipstick: NEGATIVE
Ketones, ur: NEGATIVE
Leukocytes,Ua: NEGATIVE
Nitrite: POSITIVE — AB
Specific Gravity, Urine: 1.015 (ref 1.000–1.030)
Total Protein, Urine: NEGATIVE
Urine Glucose: NEGATIVE
Urobilinogen, UA: 0.2 (ref 0.0–1.0)
pH: 5.5 (ref 5.0–8.0)

## 2021-03-19 NOTE — Telephone Encounter (Signed)
Hat was picked up with urine cup

## 2021-03-19 NOTE — Telephone Encounter (Signed)
Patients daughter in law is aware of below. Urine has been ordered and cup placed up front for her to pick up.

## 2021-03-20 ENCOUNTER — Ambulatory Visit: Payer: Medicare Other

## 2021-03-21 ENCOUNTER — Encounter: Payer: Self-pay | Admitting: Internal Medicine

## 2021-03-21 DIAGNOSIS — Z48815 Encounter for surgical aftercare following surgery on the digestive system: Secondary | ICD-10-CM | POA: Diagnosis not present

## 2021-03-21 DIAGNOSIS — I2510 Atherosclerotic heart disease of native coronary artery without angina pectoris: Secondary | ICD-10-CM | POA: Diagnosis not present

## 2021-03-21 DIAGNOSIS — K858 Other acute pancreatitis without necrosis or infection: Secondary | ICD-10-CM | POA: Diagnosis not present

## 2021-03-21 DIAGNOSIS — A498 Other bacterial infections of unspecified site: Secondary | ICD-10-CM

## 2021-03-21 DIAGNOSIS — D631 Anemia in chronic kidney disease: Secondary | ICD-10-CM | POA: Diagnosis not present

## 2021-03-21 DIAGNOSIS — N1831 Chronic kidney disease, stage 3a: Secondary | ICD-10-CM | POA: Diagnosis not present

## 2021-03-21 DIAGNOSIS — E1122 Type 2 diabetes mellitus with diabetic chronic kidney disease: Secondary | ICD-10-CM | POA: Diagnosis not present

## 2021-03-21 LAB — URINE CULTURE
MICRO NUMBER:: 12086821
SPECIMEN QUALITY:: ADEQUATE

## 2021-03-24 ENCOUNTER — Encounter: Payer: Self-pay | Admitting: Podiatry

## 2021-03-24 ENCOUNTER — Other Ambulatory Visit: Payer: Self-pay

## 2021-03-24 ENCOUNTER — Ambulatory Visit (INDEPENDENT_AMBULATORY_CARE_PROVIDER_SITE_OTHER): Payer: Medicare Other | Admitting: Podiatry

## 2021-03-24 DIAGNOSIS — M79672 Pain in left foot: Secondary | ICD-10-CM | POA: Diagnosis not present

## 2021-03-24 DIAGNOSIS — M79671 Pain in right foot: Secondary | ICD-10-CM

## 2021-03-24 DIAGNOSIS — Q828 Other specified congenital malformations of skin: Secondary | ICD-10-CM

## 2021-03-24 DIAGNOSIS — Z7901 Long term (current) use of anticoagulants: Secondary | ICD-10-CM

## 2021-03-24 DIAGNOSIS — L97521 Non-pressure chronic ulcer of other part of left foot limited to breakdown of skin: Secondary | ICD-10-CM

## 2021-03-24 MED ORDER — CEPHALEXIN 500 MG PO CAPS: 500.0000 mg | ORAL_CAPSULE | Freq: Two times a day (BID) | ORAL | 0 refills | Status: AC

## 2021-03-24 NOTE — Progress Notes (Signed)
  Subjective:  Patient ID: Holly Rose, female    DOB: 1930/05/23,  MRN: [id number]  Chief Complaint  Patient presents with   Foot Pain    Right foot pain    87 y.o. female presents with the above complaint. History confirmed with patient.  Calluses become painful again significantly the one in the base of the fifth met.  Her ulcerations have healed with [name] since last time I saw her.  She received her diabetic shoes and these been very helpful for her.  Objective:  Physical Exam: warm, good capillary refill and normal DP and PT pulses.  Large preulcerative hyperkeratotic lesions to segment 2 through 4 bilateral and plantar left heel and fifth metatarsal base right painful to palpation  Assessment:   1. Porokeratosis   2. Ulcer of left foot, limited to breakdown of skin ([place])   3. Chronic anticoagulation      Plan:  Patient was evaluated and treated and all questions answered.  All symptomatic hyperkeratoses were safely debrided with a sterile #15 blade to patient's level of comfort without incident. We discussed preventative and palliative care of these lesions including supportive and accommodative shoegear, padding, prefabricated and custom molded accommodative orthoses, use of a pumice stone and lotions/creams daily.  She did have some bleeding from the plantar left heel as well as the fifth metatarsal lateral base.  Pressure dressing with Lumicain was applied and she will remove this at home and reapply Band-Aid and Neosporin.  Return if symptoms worsen or fail to improve.

## 2021-03-24 NOTE — Telephone Encounter (Signed)
PT daughter called to advise that she has not heard anything about the keflex and would like to find out what is going on with it and would like a call when its been called in.

## 2021-03-24 NOTE — Telephone Encounter (Signed)
Culture results:   I just received the culture results and the urine culture does reveal an infection.  The only antibiotic allergy I have listed is bactrim (sulfa).  If this is correct, let me know and I would like to send in a prescription for keflex 500mg .  She would take this twice a day for 5 days.  I would also like for her to take a probiotic daily while she is on the antibiotic and for twoweeks after completing the antibiotic. Let me know how she is doing.     [name]  Written by [name], MD on 03/21/2021  8:38 PM EDT

## 2021-03-25 ENCOUNTER — Ambulatory Visit (INDEPENDENT_AMBULATORY_CARE_PROVIDER_SITE_OTHER): Payer: Medicare Other

## 2021-03-25 VITALS — Ht 63.0 in | Wt 114.0 lb

## 2021-03-25 DIAGNOSIS — Z0000 Encounter for general adult medical examination without abnormal findings: Secondary | ICD-10-CM | POA: Diagnosis not present

## 2021-03-25 NOTE — Patient Instructions (Addendum)
Holly Rose , Thank you for taking time to come for your Medicare Wellness Visit. I appreciate your ongoing commitment to your health goals. Please review the following plan we discussed and let me know if I can assist you in the future.   These are the goals we discussed:  Goals       Patient Stated     Follow up with Primary Care Provider (pt-stated)      Maintain healthy lifestyle         This is a list of the screening recommended for you and due dates:  Health Maintenance  Topic Date Due   Urine Protein Check  04/19/2019   Zoster (Shingles) Vaccine (1 of 2) 06/25/2021*   Tetanus Vaccine  03/25/2022*   Flu Shot  04/14/2021   COVID-19 Vaccine (5 - Booster for Pfizer series) 06/01/2021   Hemoglobin A1C  08/24/2021   Eye exam for diabetics  11/18/2021   Complete foot exam   03/24/2022   DEXA scan (bone density measurement)  Completed   Pneumonia vaccines  Completed   HPV Vaccine  Aged Out  *Topic was postponed. The date shown is not the original due date.    Advanced directives: on file  Conditions/risks identified: none new  Follow up in one year for your annual wellness visit    Preventive Care 65 Years and Older, Female Preventive care refers to lifestyle choices and visits with your health care provider that can promote health and wellness. What does preventive care include? A yearly physical exam. This is also called an annual well check. Dental exams once or twice a year. Routine eye exams. Ask your health care provider how often you should have your eyes checked. Personal lifestyle choices, including: Daily care of your teeth and gums. Regular physical activity. Eating a healthy diet. Avoiding tobacco and drug use. Limiting alcohol use. Practicing safe sex. Taking low-dose aspirin every day. Taking vitamin and mineral supplements as recommended by your health care provider. What happens during an annual well check? The services and screenings done by  your health care provider during your annual well check will depend on your age, overall health, lifestyle risk factors, and family history of disease. Counseling  Your health care provider may ask you questions about your: Alcohol use. Tobacco use. Drug use. Emotional well-being. Home and relationship well-being. Sexual activity. Eating habits. History of falls. Memory and ability to understand (cognition). Work and work Statistician. Reproductive health. Screening  You may have the following tests or measurements: Height, weight, and BMI. Blood pressure. Lipid and cholesterol levels. These may be checked every 5 years, or more frequently if you are over 37 years old. Skin check. Lung cancer screening. You may have this screening every year starting at age 55 if you have a 30-pack-year history of smoking and currently smoke or have quit within the past 15 years. Fecal occult blood test (FOBT) of the stool. You may have this test every year starting at age 2. Flexible sigmoidoscopy or colonoscopy. You may have a sigmoidoscopy every 5 years or a colonoscopy every 10 years starting at age 39. Hepatitis C blood test. Hepatitis B blood test. Sexually transmitted disease (STD) testing. Diabetes screening. This is done by checking your blood sugar (glucose) after you have not eaten for a while (fasting). You may have this done every 1-3 years. Bone density scan. This is done to screen for osteoporosis. You may have this done starting at age 50. Mammogram. This may be  done every 1-2 years. Talk to your health care provider about how often you should have regular mammograms. Talk with your health care provider about your test results, treatment options, and if necessary, the need for more tests. Vaccines  Your health care provider may recommend certain vaccines, such as: Influenza vaccine. This is recommended every year. Tetanus, diphtheria, and acellular pertussis (Tdap, Td) vaccine. You  may need a Td booster every 10 years. Zoster vaccine. You may need this after age 67. Pneumococcal 13-valent conjugate (PCV13) vaccine. One dose is recommended after age 63. Pneumococcal polysaccharide (PPSV23) vaccine. One dose is recommended after age 60. Talk to your health care provider about which screenings and vaccines you need and how often you need them. This information is not intended to replace advice given to you by your health care provider. Make sure you discuss any questions you have with your health care provider. Document Released: 09/27/2015 Document Revised: 05/20/2016 Document Reviewed: 07/02/2015 Elsevier Interactive Patient Education  2017 [employer] Prevention in the Home Falls can cause injuries. They can happen to people of all ages. There are many things you can do to make your home safe and to help prevent falls. What can I do on the outside of my home? Regularly fix the edges of walkways and driveways and fix any cracks. Remove anything that might make you trip as you walk through a door, such as a raised step or threshold. Trim any bushes or trees on the path to your home. Use bright outdoor lighting. Clear any walking paths of anything that might make someone trip, such as rocks or tools. Regularly check to see if handrails are loose or broken. Make sure that both sides of any steps have handrails. Any raised decks and porches should have guardrails on the edges. Have any leaves, snow, or ice cleared regularly. Use sand or salt on walking paths during winter. Clean up any spills in your garage right away. This includes oil or grease spills. What can I do in the bathroom? Use night lights. Install grab bars by the toilet and in the tub and shower. Do not use towel bars as grab bars. Use non-skid mats or decals in the tub or shower. If you need to sit down in the shower, use a plastic, non-slip stool. Keep the floor dry. Clean up any water that spills  on the floor as soon as it happens. Remove soap buildup in the tub or shower regularly. Attach bath mats securely with double-sided non-slip rug tape. Do not have throw rugs and other things on the floor that can make you trip. What can I do in the bedroom? Use night lights. Make sure that you have a light by your bed that is easy to reach. Do not use any sheets or blankets that are too big for your bed. They should not hang down onto the floor. Have a firm chair that has side arms. You can use this for support while you get dressed. Do not have throw rugs and other things on the floor that can make you trip. What can I do in the kitchen? Clean up any spills right away. Avoid walking on wet floors. Keep items that you use a lot in easy-to-reach places. If you need to reach something above you, use a strong step stool that has a grab bar. Keep electrical cords out of the way. Do not use floor polish or wax that makes floors slippery. If you must use wax,  use non-skid floor wax. Do not have throw rugs and other things on the floor that can make you trip. What can I do with my stairs? Do not leave any items on the stairs. Make sure that there are handrails on both sides of the stairs and use them. Fix handrails that are broken or loose. Make sure that handrails are as long as the stairways. Check any carpeting to make sure that it is firmly attached to the stairs. Fix any carpet that is loose or worn. Avoid having throw rugs at the top or bottom of the stairs. If you do have throw rugs, attach them to the floor with carpet tape. Make sure that you have a light switch at the top of the stairs and the bottom of the stairs. If you do not have them, ask someone to add them for you. What else can I do to help prevent falls? Wear shoes that: Do not have high heels. Have rubber bottoms. Are comfortable and fit you well. Are closed at the toe. Do not wear sandals. If you use a stepladder: Make  sure that it is fully opened. Do not climb a closed stepladder. Make sure that both sides of the stepladder are locked into place. Ask someone to hold it for you, if possible. Clearly mark and make sure that you can see: Any grab bars or handrails. First and last steps. Where the edge of each step is. Use tools that help you move around (mobility aids) if they are needed. These include: Canes. Walkers. Scooters. Crutches. Turn on the lights when you go into a dark area. Replace any light bulbs as soon as they burn out. Set up your furniture so you have a clear path. Avoid moving your furniture around. If any of your floors are uneven, fix them. If there are any pets around you, be aware of where they are. Review your medicines with your doctor. Some medicines can make you feel dizzy. This can increase your chance of falling. Ask your doctor what other things that you can do to help prevent falls. This information is not intended to replace advice given to you by your health care provider. Make sure you discuss any questions you have with your health care provider. Document Released: 06/27/2009 Document Revised: 02/06/2016 Document Reviewed: 10/05/2014 Elsevier Interactive Patient Education  2017 [employer].

## 2021-03-25 NOTE — Progress Notes (Signed)
Subjective:   Melanni A Brierley is a 87 y.o. female who presents for Medicare Annual (Subsequent) preventive examination.  Review of Systems    No ROS.  Medicare Wellness Virtual Visit.  Visual/audio telehealth visit, UTA vital signs.   See social history for additional risk factors.   Cardiac Risk Factors include: diabetes mellitus;advanced age (>75mn, >>1women)     Objective:    Today's Vitals   03/25/21 1319  Weight: 114 lb (51.7 kg)  Height: 5' 3"  (1.6 m)   Body mass index is 20.19 kg/m.  Advanced Directives 03/25/2021 02/19/2021 02/19/2021 01/21/2021 12/26/2020 12/25/2020 08/23/2020  Does Patient Have a Medical Advance Directive? Yes - No Yes - No Yes  Type of [place] - - [place] - - [place]  Does patient want to make changes to medical advance directive? No - Patient declined - - - - - -  Copy of HT[place] Chart? Yes - validated most recent copy scanned in chart (See row information) - - - - - No - copy requested  Would patient like information on creating a medical advance directive? - No - Patient declined - - No - Patient declined - No - Patient declined    Current Medications (verified) Outpatient Encounter Medications as of 03/25/2021  Medication Sig   acetaminophen (TYLENOL) 325 MG tablet Take 2 tablets (650 mg total) by mouth every 6 (six) hours as needed for mild pain (or Fever >/= 101).   Aflibercept 2 MG/0.05ML SOLN 1 each by Intravitreal route every 8 (eight) weeks.   apixaban (ELIQUIS) 2.5 MG TABS tablet Take 1 tablet (2.5 mg total) by mouth 2 (two) times daily.   atorvastatin (LIPITOR) 40 MG tablet Take 1 tablet (40 mg total) by mouth daily.   Biotin w/ Vitamins C & E (HAIR/SKIN/NAILS PO) Take 3 tablets by mouth daily. 2 in the am, 1 in the pm   blood glucose meter kit and supplies Per patient please dispense One touch Ultra2  meter.Use meter and supplies three times a day to check blood sugar. ICD10: E11.40   Blood Glucose Monitoring Suppl (FREESTYLE LITE) DEVI 2 (two) times daily.   cephALEXin (KEFLEX) 500 MG capsule Take 1 capsule (500 mg total) by mouth 2 (two) times daily for 5 days.   Cholecalciferol (VITAMIN D) 50 MCG (2000 UT) CAPS Take 4,000 Units by mouth daily.   denosumab (PROLIA) 60 MG/ML SOSY injection Inject 60 mg into the skin every 6 (six) months.   divalproex (DEPAKOTE ER) 250 MG 24 hr tablet Take 250 mg by mouth daily.   furosemide (LASIX) 20 MG tablet Take 1 tablet (20 mg total) by mouth daily.   glucose blood (FREESTYLE LITE) test strip Used to check blood sugars twice a day. DX E11.9   isosorbide mononitrate (IMDUR) 30 MG 24 hr tablet Take 1 tablet (30 mg total) by mouth daily.   Lancets (FREESTYLE) lancets 2 (two) times daily.   Magnesium 200 MG TABS Take 200 mg by mouth 2 (two) times a day.   meclizine (ANTIVERT) 12.5 MG tablet Take 1 tablet (12.5 mg total) by mouth 2 (two) times daily as needed for dizziness.   metFORMIN (GLUCOPHAGE-XR) 500 MG 24 hr tablet Take 1 tablet by mouth twice daily   metoprolol tartrate (LOPRESSOR) 25 MG tablet Take 25 mg by mouth 2 (two) times daily.   mirtazapine (REMERON) 15 MG tablet Take 15 mg  by mouth at bedtime.   Multiple Vitamins-Minerals (PRESERVISION AREDS 2 PO) Take 1 tablet by mouth 2 (two) times daily.    mupirocin ointment (BACTROBAN) 2 % Apply 1 application topically daily.   OVER THE COUNTER MEDICATION Take 2 tablets by mouth in the morning and at bedtime. Energizing iron tablets   Polyethyl Glycol-Propyl Glycol (SYSTANE OP) Apply 1 drop to eye 2 (two) times daily.   Probiotic Product (PROBIOTIC DAILY PO) Take by mouth.   tamoxifen (NOLVADEX) 20 MG tablet Take 1 tablet (20 mg total) by mouth daily.   vitamin B-12 (CYANOCOBALAMIN) 500 MCG tablet Take 500 mcg by mouth 2 (two) times a day.   No facility-administered encounter medications on file as of  03/25/2021.    Allergies (verified) Lyrica [pregabalin], Neurontin [gabapentin], and Bactrim [sulfamethoxazole-trimethoprim]   History: Past Medical History:  Diagnosis Date   Allergy    Anemia    Arthritis    Atrial fibrillation ([place]) 2004   Breast cancer, left ([place]) 10/2017   Lumpectomy and rad tx's.    CAD (coronary artery disease)    Complication of anesthesia    hard to wake up with general anesthesia   Diabetes ([place])    Dysrhythmia    A-fib   Heart attack ([place]) 2008   Heart murmur    History of kidney stones    History of sick sinus syndrome    s/p pacemaker placement   Hyperlipidemia    Macular degeneration    Neuropathy    Neuropathy    Pacemaker 06/2010   Personal history of radiation therapy 11/2017   LEFT lumpectomy    Squamous cell skin cancer    TIA (transient ischemic attack)    Past Surgical History:  Procedure Laterality Date   ABDOMINAL HYSTERECTOMY  1960's   [place] Left 12/17/2016   SINGLE FRAGMENT OF ATYPICAL EPITHELIAL CELLS   BREAST BIOPSY [facility]/08/2017   [facility]   BREAST EXCISIONAL BIOPSY Left 01/18/1986   Benign microcalcifications, [employer].   BREAST LUMPECTOMY Left 05/26/2017   13 mm,T1c, N0; ER/ PR+, Her 2 neu negative, HIGH RISK by Mammoprint ;  Surgeon: [name], MD;  Location: [facility] ORS;  Service: General;  Laterality: Left;   [place] PROPOFOL N/A 09/12/2015   Procedure: COLONOSCOPY WITH PROPOFOL;  Surgeon: [name], MD;  Location[facility];  Service: Endoscopy;  Laterality: N/A;   ERCP N/A 02/21/2021   Procedure: ENDOSCOPIC RETROGRADE CHOLANGIOPANCREATOGRAPHY (ERCP);  Surgeon: Lucilla Lame, MD;  Location[facility];  Service: Endoscopy;  Laterality: N/A;   IMPLANTABLE CARDIOVERTER DEFIBRILLATOR (ICD) GENERATOR CHANGE Left 03/29/2019    Procedure: PACEMAKER CHANGE OUT;  Surgeon: [name], MD;  Location: [facility] ORS;  Service: Cardiovascular;  Laterality: Left;   KIDNEY STONE SURGERY  2018   MASS EXCISION Left 02/24/2021   Procedure: EXCISION MASS SCALP;  Surgeon: [name], MD;  Location: [facility] ORS;  Service: General;  Laterality: Left;   MOHS SURGERY  2019   forehead   pace maker  2008   [place]   TONSILLECTOMY AND ADENOIDECTOMY  83"s   Family History  Problem Relation Age of Onset   Stroke Mother    Diabetes Mother    Cancer Maternal Aunt  Breast Cancer   Breast cancer Maternal Aunt    Arthritis Maternal Grandmother    Cancer Maternal Aunt        Lung Cancer   Diabetes Son    Cancer Son    Kidney disease Neg Hx    Bladder Cancer Neg Hx    Social History   Socioeconomic History   Marital status: Married    Spouse name: Tom   Number of children: 2   Years of education: Not on file   Highest education level: Not on file  Occupational History   Not on file  Tobacco Use   Smoking status: Former    Years: 5.00    Pack years: 0.00    Types: Cigarettes    Quit date: 09/14/1968    Years since quitting: 52.5   Smokeless tobacco: Never   Tobacco comments:    quit 1970  Vaping Use   Vaping Use: Never used  Substance and Sexual Activity   Alcohol use: No    Alcohol/week: 0.0 standard drinks   Drug use: No   Sexual activity: Not on file  Other Topics Concern   Not on file  Social History Narrative   Live home with husband in [place] Resource Strain: Low Risk    Difficulty of Paying Living Expenses: Not hard at all  Food Insecurity: No Food Insecurity   Worried About Charity fundraiser in the Last Year: Never true   Arboriculturist in the Last Year: Never true  Transportation Needs: No Transportation Needs   Lack of Transportation (Medical): No   Lack of Transportation (Non-Medical): No  Physical  Activity: Insufficiently Active   Days of Exercise per Week: 2 days   Minutes of Exercise per Session: 20 min  Stress: No Stress Concern Present   Feeling of Stress : Not at all  Social Connections: Moderately Integrated   Frequency of Communication with Friends and Family: Three times a week   Frequency of Social Gatherings with Friends and Family: Twice a week   Attends Religious Services: More than 4 times per year   Active Member of [employer] or Organizations: No   Attends Music therapist: Never   Marital Status: Married    Tobacco Counseling Counseling given: Not Answered Tobacco comments: quit 1970   Clinical Intake:  Pre-visit preparation completed: Yes        Diabetes: Yes (Followed by pcp)  How often do you need to have someone help you when you read instructions, pamphlets, or other written materials from your doctor or pharmacy?: 4 - Often    Interpreter Needed?: No      Activities of Daily Living In your present state of health, do you have any difficulty performing the following activities: 03/25/2021 02/19/2021  Hearing? N N  Vision? N N  Difficulty concentrating or making decisions? N Y  Walking or climbing stairs? Y Y  Dressing or bathing? N Y  Doing errands, shopping? [place] Don[place]  Preparing Food and eating ? Y -  Using the Toilet? N -  In the past six months, have you accidently leaked urine? N -  Do you have problems with loss of bowel control? N -  Managing your Medications? Y -  Comment Husband and daughter assist -  Managing your Finances? Y -  Comment Husband assist -  Housekeeping or managing your Housekeeping? Y -  Comment Husband and daughter -  Some recent data might be hidden    Patient Care Team: [name], MD as PCP - General (Internal Medicine) [name], MD (Internal Medicine) Bary Castilla, Forest Gleason, MD (General Surgery) [name], MD as Consulting Physician (Oncology)  Indicate any recent Medical  Services you may have received from other than [facility] providers in the past year (date may be approximate).     Assessment:   This is a routine wellness examination for [place].  I connected with Manisha today by telephone and verified that I am speaking with the correct person using two identifiers. Location patient: home Location provider: work Persons participating in the virtual visit: patient, Marine scientist.    I discussed the limitations, risks, security and privacy concerns of performing an evaluation and management service by telephone and the availability of in person appointments. The patient expressed understanding and verbally consented to this telephonic visit.    Interactive audio and video telecommunications were attempted between this provider and patient, however failed, due to patient having technical difficulties OR patient did not have access to video capability.  We continued and completed visit with audio only.  Some vital signs may be absent or patient reported.   Hearing/Vision screen Hearing Screening - Comments:: Patient is able to hear conversational tones without difficulty.  No issues reported. Vision Screening - Comments:: [facility]  Wears corrective lenses  Injections administered Macular degeneration  Cataract extraction, bilateral  Visual acuity not assessed per patient preference since they have regular follow up with the ophthalmologist  Dietary issues and exercise activities discussed: Current Exercise Habits: Home exercise routine, Time (Minutes): 20, Frequency (Times/Week): 2, Weekly Exercise (Minutes/Week): 40, Intensity: Mild Healthy diet Good water intake   Goals Addressed               This Visit's Progress     Patient Stated     Follow up with Primary Care Provider (pt-stated)        Maintain healthy lifestyle        Depression Screen PHQ 2/9 Scores 03/25/2021 05/28/2020 04/15/2020 03/19/2020 10/16/2019 03/13/2019 09/29/2017   PHQ - 2 Score 0 0 0 0 0 0 0  PHQ- 9 Score - - - - 0 - -    Fall Risk Fall Risk  03/25/2021 07/16/2020 05/28/2020 05/01/2020 04/15/2020  Falls in the past year? 0 0 0 - 0  Comment - - - - -  Number falls in past yr: 0 - 0 - 0  Injury with Fall? 0 - 0 - 0  Risk for fall due to : - - - History of fall(s) -  Follow up Falls evaluation completed Falls evaluation completed Falls evaluation completed Falls evaluation completed Falls evaluation completed    [place]: Handrails in use when climbing stairs?Yes Home free of loose throw rugs in walkways, pet beds, electrical cords, etc? Yes  Adequate lighting in your home to reduce risk of falls? Yes   ASSISTIVE DEVICES UTILIZED TO PREVENT FALLS: Life alert? Yes  Use of a cane, walker or w/c? Yes  Grab bars in the bathroom? Yes  Shower chair or bench in shower? Yes  Elevated toilet seat or a handicapped toilet? Yes   TIMED UP AND GO: Was the test performed? No .   Cognitive Function: Patient is alert. Dementia Dx. Unable to perform MMSE/6CIT. MMSE - Mini Mental State Exam 04/15/2020  Orientation to time 4  Orientation to Place 3  Registration 3  Attention/ Calculation 5  Recall 2  Language- name 2 objects 2  Language- repeat 1  Language- follow 3 step command 3  Language- read & follow direction 1  Write a sentence 1  Copy design 0  Total score 25     6CIT Screen 03/19/2020 03/13/2019 04/29/2017  What Year? 0 points 0 points 0 points  What month? 0 points 0 points 0 points  What time? - 0 points 0 points  Count back from 20 - 0 points 0 points  Months in reverse - 0 points 0 points  Repeat phrase - 0 points 0 points  Total Score - 0 0    Immunizations Immunization History  Administered Date(s) Administered   Fluad Quad(high Dose 65+) 06/19/2019, 07/29/2020   Influenza, High Dose Seasonal PF 06/03/2016, 07/13/2017, 05/27/2018   Influenza, Seasonal, Injecte, Preservative Fre 06/24/2007,  06/16/2011   Influenza,inj,Quad PF,6+ Mos 06/11/2015   Influenza-Unspecified 06/11/2015, 06/28/2020   PFIZER(Purple Top)SARS-COV-2 Vaccination 09/19/2019, 10/18/2019, 06/20/2020, 01/29/2021   Pneumococcal Conjugate-13 11/16/2014   Pneumococcal Polysaccharide-23 06/24/2007   Td 09/14/2009   Zoster, Live 09/04/2013    TDAP status: Due, Education has been provided regarding the importance of this vaccine. Advised may receive this vaccine at local pharmacy or Health Dept. Aware to provide a copy of the vaccination record if obtained from local pharmacy or Health Dept. Verbalized acceptance and understanding. Deferred.   Shingles vaccine- Due, Education has been provided regarding the importance of this vaccine. Advised may receive this vaccine at local pharmacy or Health Dept. Aware to provide a copy of the vaccination record if obtained from local pharmacy or Health Dept. Verbalized acceptance and understanding. Deferred.   Health Maintenance Health Maintenance  Topic Date Due   URINE MICROALBUMIN  04/19/2019   Zoster Vaccines- Shingrix (1 of 2) 06/25/2021 (Originally 11/19/1948)   TETANUS/TDAP  03/25/2022 (Originally 09/15/2019)   INFLUENZA VACCINE  04/14/2021   COVID-19 Vaccine (5 - Booster for Pfizer series) 06/01/2021   HEMOGLOBIN A1C  08/24/2021   OPHTHALMOLOGY EXAM  11/18/2021   FOOT EXAM  03/24/2022   DEXA SCAN  Completed   PNA vac Low Risk Adult  Completed   HPV VACCINES  Aged Out   Colorectal cancer screening: No longer required.   Mammogram- scheduled 04/03/21.  Bone density- completed 11/2020. Prolia.   Lung Cancer Screening: (Low Dose CT Chest recommended if Age 63-80 years, 30 pack-year currently smoking OR have quit w/in 15years.) does not qualify.   Hepatitis C Screening: does not qualify  Vision Screening: Recommended annual ophthalmology exams for early detection of glaucoma and other disorders of the eye. Is the patient up to date with their annual eye exam?  Yes .    Dental Screening: Recommended annual dental exams for proper oral hygiene.  Community Resource Referral / Chronic Care Management: CRR required this visit?  No   CCM required this visit?  No      Plan:   Keep all routine maintenance appointments.   I have personally reviewed and noted the following in the patient's chart:   Medical and social history Use of alcohol, tobacco or illicit drugs  Current medications and supplements including opioid prescriptions. Patient is not currently taking opioid.  Functional ability and status Nutritional status Physical activity Advanced directives List of other physicians Hospitalizations, surgeries, and ER visits in previous 12 months Vitals Screenings to include cognitive, depression, and falls Referrals and appointments  In addition, I have reviewed and discussed with patient certain preventive protocols, quality metrics,  and best practice recommendations. A written personalized care plan for preventive services as well as general preventive health recommendations were provided to patient via mychart.     Varney Biles, LPN   [id number]

## 2021-03-26 ENCOUNTER — Other Ambulatory Visit: Payer: Self-pay | Admitting: Oncology

## 2021-03-26 DIAGNOSIS — K858 Other acute pancreatitis without necrosis or infection: Secondary | ICD-10-CM | POA: Diagnosis not present

## 2021-03-26 DIAGNOSIS — D631 Anemia in chronic kidney disease: Secondary | ICD-10-CM | POA: Diagnosis not present

## 2021-03-26 DIAGNOSIS — N1831 Chronic kidney disease, stage 3a: Secondary | ICD-10-CM | POA: Diagnosis not present

## 2021-03-26 DIAGNOSIS — Z48815 Encounter for surgical aftercare following surgery on the digestive system: Secondary | ICD-10-CM | POA: Diagnosis not present

## 2021-03-26 DIAGNOSIS — I2510 Atherosclerotic heart disease of native coronary artery without angina pectoris: Secondary | ICD-10-CM | POA: Diagnosis not present

## 2021-03-26 DIAGNOSIS — E1122 Type 2 diabetes mellitus with diabetic chronic kidney disease: Secondary | ICD-10-CM | POA: Diagnosis not present

## 2021-03-28 DIAGNOSIS — H353221 Exudative age-related macular degeneration, left eye, with active choroidal neovascularization: Secondary | ICD-10-CM | POA: Diagnosis not present

## 2021-03-30 ENCOUNTER — Other Ambulatory Visit: Payer: Self-pay | Admitting: Oncology

## 2021-03-30 DIAGNOSIS — Z48815 Encounter for surgical aftercare following surgery on the digestive system: Secondary | ICD-10-CM | POA: Diagnosis not present

## 2021-03-30 DIAGNOSIS — M1990 Unspecified osteoarthritis, unspecified site: Secondary | ICD-10-CM | POA: Diagnosis not present

## 2021-03-30 DIAGNOSIS — Z90710 Acquired absence of both cervix and uterus: Secondary | ICD-10-CM | POA: Diagnosis not present

## 2021-03-30 DIAGNOSIS — Z9089 Acquired absence of other organs: Secondary | ICD-10-CM | POA: Diagnosis not present

## 2021-03-30 DIAGNOSIS — E1140 Type 2 diabetes mellitus with diabetic neuropathy, unspecified: Secondary | ICD-10-CM | POA: Diagnosis not present

## 2021-03-30 DIAGNOSIS — Z87891 Personal history of nicotine dependence: Secondary | ICD-10-CM | POA: Diagnosis not present

## 2021-03-30 DIAGNOSIS — D631 Anemia in chronic kidney disease: Secondary | ICD-10-CM | POA: Diagnosis not present

## 2021-03-30 DIAGNOSIS — I2510 Atherosclerotic heart disease of native coronary artery without angina pectoris: Secondary | ICD-10-CM | POA: Diagnosis not present

## 2021-03-30 DIAGNOSIS — Z853 Personal history of malignant neoplasm of breast: Secondary | ICD-10-CM | POA: Diagnosis not present

## 2021-03-30 DIAGNOSIS — I252 Old myocardial infarction: Secondary | ICD-10-CM | POA: Diagnosis not present

## 2021-03-30 DIAGNOSIS — F32A Depression, unspecified: Secondary | ICD-10-CM | POA: Diagnosis not present

## 2021-03-30 DIAGNOSIS — E1122 Type 2 diabetes mellitus with diabetic chronic kidney disease: Secondary | ICD-10-CM | POA: Diagnosis not present

## 2021-03-30 DIAGNOSIS — I4891 Unspecified atrial fibrillation: Secondary | ICD-10-CM | POA: Diagnosis not present

## 2021-03-30 DIAGNOSIS — N1831 Chronic kidney disease, stage 3a: Secondary | ICD-10-CM | POA: Diagnosis not present

## 2021-03-30 DIAGNOSIS — L989 Disorder of the skin and subcutaneous tissue, unspecified: Secondary | ICD-10-CM | POA: Diagnosis not present

## 2021-03-30 DIAGNOSIS — F0390 Unspecified dementia without behavioral disturbance: Secondary | ICD-10-CM | POA: Diagnosis not present

## 2021-03-30 DIAGNOSIS — Z85828 Personal history of other malignant neoplasm of skin: Secondary | ICD-10-CM | POA: Diagnosis not present

## 2021-03-30 DIAGNOSIS — H3530 Unspecified macular degeneration: Secondary | ICD-10-CM | POA: Diagnosis not present

## 2021-03-30 DIAGNOSIS — Z95810 Presence of automatic (implantable) cardiac defibrillator: Secondary | ICD-10-CM | POA: Diagnosis not present

## 2021-03-30 DIAGNOSIS — Z8673 Personal history of transient ischemic attack (TIA), and cerebral infarction without residual deficits: Secondary | ICD-10-CM | POA: Diagnosis not present

## 2021-03-30 DIAGNOSIS — Z9049 Acquired absence of other specified parts of digestive tract: Secondary | ICD-10-CM | POA: Diagnosis not present

## 2021-03-30 DIAGNOSIS — Z87442 Personal history of urinary calculi: Secondary | ICD-10-CM | POA: Diagnosis not present

## 2021-03-30 DIAGNOSIS — K858 Other acute pancreatitis without necrosis or infection: Secondary | ICD-10-CM | POA: Diagnosis not present

## 2021-03-30 DIAGNOSIS — Z950 Presence of cardiac pacemaker: Secondary | ICD-10-CM | POA: Diagnosis not present

## 2021-03-30 DIAGNOSIS — E785 Hyperlipidemia, unspecified: Secondary | ICD-10-CM | POA: Diagnosis not present

## 2021-03-31 ENCOUNTER — Encounter: Payer: Self-pay | Admitting: Oncology

## 2021-04-01 DIAGNOSIS — H353211 Exudative age-related macular degeneration, right eye, with active choroidal neovascularization: Secondary | ICD-10-CM | POA: Diagnosis not present

## 2021-04-03 ENCOUNTER — Ambulatory Visit
Admission: RE | Admit: 2021-04-03 | Discharge: 2021-04-03 | Disposition: A | Discharge status: Home / Self Care | Payer: Medicare Other | Source: Ambulatory Visit | Attending: Oncology | Admitting: Oncology

## 2021-04-03 ENCOUNTER — Other Ambulatory Visit: Payer: Self-pay

## 2021-04-03 DIAGNOSIS — D631 Anemia in chronic kidney disease: Secondary | ICD-10-CM | POA: Diagnosis not present

## 2021-04-03 DIAGNOSIS — Z1231 Encounter for screening mammogram for malignant neoplasm of breast: Secondary | ICD-10-CM | POA: Insufficient documentation

## 2021-04-03 DIAGNOSIS — E1122 Type 2 diabetes mellitus with diabetic chronic kidney disease: Secondary | ICD-10-CM | POA: Diagnosis not present

## 2021-04-03 DIAGNOSIS — N1831 Chronic kidney disease, stage 3a: Secondary | ICD-10-CM | POA: Diagnosis not present

## 2021-04-03 DIAGNOSIS — K858 Other acute pancreatitis without necrosis or infection: Secondary | ICD-10-CM | POA: Diagnosis not present

## 2021-04-03 DIAGNOSIS — I2510 Atherosclerotic heart disease of native coronary artery without angina pectoris: Secondary | ICD-10-CM | POA: Diagnosis not present

## 2021-04-03 DIAGNOSIS — Z48815 Encounter for surgical aftercare following surgery on the digestive system: Secondary | ICD-10-CM | POA: Diagnosis not present

## 2021-04-09 DIAGNOSIS — Z48815 Encounter for surgical aftercare following surgery on the digestive system: Secondary | ICD-10-CM | POA: Diagnosis not present

## 2021-04-09 DIAGNOSIS — I2510 Atherosclerotic heart disease of native coronary artery without angina pectoris: Secondary | ICD-10-CM | POA: Diagnosis not present

## 2021-04-09 DIAGNOSIS — N1831 Chronic kidney disease, stage 3a: Secondary | ICD-10-CM | POA: Diagnosis not present

## 2021-04-09 DIAGNOSIS — K858 Other acute pancreatitis without necrosis or infection: Secondary | ICD-10-CM | POA: Diagnosis not present

## 2021-04-09 DIAGNOSIS — D631 Anemia in chronic kidney disease: Secondary | ICD-10-CM | POA: Diagnosis not present

## 2021-04-09 DIAGNOSIS — E1122 Type 2 diabetes mellitus with diabetic chronic kidney disease: Secondary | ICD-10-CM | POA: Diagnosis not present

## 2021-04-16 DIAGNOSIS — I2510 Atherosclerotic heart disease of native coronary artery without angina pectoris: Secondary | ICD-10-CM | POA: Diagnosis not present

## 2021-04-16 DIAGNOSIS — D631 Anemia in chronic kidney disease: Secondary | ICD-10-CM | POA: Diagnosis not present

## 2021-04-16 DIAGNOSIS — K858 Other acute pancreatitis without necrosis or infection: Secondary | ICD-10-CM | POA: Diagnosis not present

## 2021-04-16 DIAGNOSIS — Z48815 Encounter for surgical aftercare following surgery on the digestive system: Secondary | ICD-10-CM | POA: Diagnosis not present

## 2021-04-16 DIAGNOSIS — N1831 Chronic kidney disease, stage 3a: Secondary | ICD-10-CM | POA: Diagnosis not present

## 2021-04-16 DIAGNOSIS — E1122 Type 2 diabetes mellitus with diabetic chronic kidney disease: Secondary | ICD-10-CM | POA: Diagnosis not present

## 2021-04-17 ENCOUNTER — Telehealth: Payer: Self-pay

## 2021-04-17 NOTE — Telephone Encounter (Signed)
Placed call to [facility] to get an updated medication list for pt. LMTCB

## 2021-04-23 ENCOUNTER — Other Ambulatory Visit: Payer: Self-pay | Admitting: Internal Medicine

## 2021-04-29 DIAGNOSIS — F29 Unspecified psychosis not due to a substance or known physiological condition: Secondary | ICD-10-CM | POA: Diagnosis not present

## 2021-04-29 DIAGNOSIS — F39 Unspecified mood [affective] disorder: Secondary | ICD-10-CM | POA: Diagnosis not present

## 2021-04-29 DIAGNOSIS — F5105 Insomnia due to other mental disorder: Secondary | ICD-10-CM | POA: Diagnosis not present

## 2021-04-30 ENCOUNTER — Encounter: Payer: Self-pay | Admitting: Internal Medicine

## 2021-05-01 ENCOUNTER — Encounter: Payer: Self-pay | Admitting: Internal Medicine

## 2021-05-01 ENCOUNTER — Ambulatory Visit (INDEPENDENT_AMBULATORY_CARE_PROVIDER_SITE_OTHER): Payer: Medicare Other | Admitting: Internal Medicine

## 2021-05-01 ENCOUNTER — Other Ambulatory Visit: Payer: Self-pay

## 2021-05-01 VITALS — BP 128/62 | HR 86 | Temp 98.0°F | Ht 62.99 in | Wt 114.0 lb

## 2021-05-01 DIAGNOSIS — E1140 Type 2 diabetes mellitus with diabetic neuropathy, unspecified: Secondary | ICD-10-CM | POA: Diagnosis not present

## 2021-05-01 DIAGNOSIS — I25119 Atherosclerotic heart disease of native coronary artery with unspecified angina pectoris: Secondary | ICD-10-CM | POA: Diagnosis not present

## 2021-05-01 DIAGNOSIS — I4821 Permanent atrial fibrillation: Secondary | ICD-10-CM | POA: Diagnosis not present

## 2021-05-01 DIAGNOSIS — R441 Visual hallucinations: Secondary | ICD-10-CM | POA: Diagnosis not present

## 2021-05-01 DIAGNOSIS — C50912 Malignant neoplasm of unspecified site of left female breast: Secondary | ICD-10-CM | POA: Diagnosis not present

## 2021-05-01 DIAGNOSIS — F439 Reaction to severe stress, unspecified: Secondary | ICD-10-CM

## 2021-05-01 DIAGNOSIS — J439 Emphysema, unspecified: Secondary | ICD-10-CM | POA: Diagnosis not present

## 2021-05-01 DIAGNOSIS — I700 Atherosclerosis of aorta: Secondary | ICD-10-CM

## 2021-05-01 DIAGNOSIS — E785 Hyperlipidemia, unspecified: Secondary | ICD-10-CM | POA: Diagnosis not present

## 2021-05-01 NOTE — Telephone Encounter (Signed)
Called [name]. Advised that we could do an EKG on [place] if she would like. She is unsure if they are going to start the buspar or not. Going to discuss with Dr [name] today at appt and will do EKG depending on decision to start medication

## 2021-05-01 NOTE — Progress Notes (Signed)
Patient ID: Holly Rose, female   DOB: 1930-07-20, 87 y.o.   MRN: [id number]   Subjective:    Patient ID: Holly Rose, female    DOB: [birth year], 87 y.o.   MRN: [id number]  HPI This visit occurred during the SARS-CoV-2 public health emergency.  Safety protocols were in place, including screening questions prior to the visit, additional usage of staff PPE, and extensive cleaning of exam room while observing appropriate contact time as indicated for disinfecting solutions.   Patient here for a scheduled follow up.  She is accompanied by her daughter-n-law.  History obtained from both of them.  Admitted 02/19/21 - 02/26/21 - presented to ER for evaluation of abdominal pain.  S/p MRCP.  Evidence of choledocholithiasis and recurrent gallstone pancreatitis.  S/p ERCP and removal of stones 02/21/21.  F/u lap chole on 6/13.  Has done well post procedures.  Eating.  No abdominal pain.  No nausea or vomiting reported.  No chest pain or sob reported.  No increased cough or chest congestion.  S/p removal of squamous cell CA from her scalp.  Followed by Dr Bary Castilla.  Seeing Dr Nicolasa Ducking for f/u anxiety, etc. Notes reviewed.  Maintained on depakote, remeron and buspar.  Overall it appears things are stable.   Past Medical History:  Diagnosis Date   Allergy    Anemia    Arthritis    Atrial fibrillation ([place]) 2004   Breast cancer, left ([place]) 10/2017   Lumpectomy and rad tx's.    CAD (coronary artery disease)    Complication of anesthesia    hard to wake up with general anesthesia   Diabetes ([place])    Dysrhythmia    A-fib   Heart attack ([place]) 2008   Heart murmur    History of kidney stones    History of sick sinus syndrome    s/p pacemaker placement   Hyperlipidemia    Macular degeneration    Neuropathy    Neuropathy    Pacemaker 06/2010   Personal history of radiation therapy 11/2017   LEFT lumpectomy    Squamous cell skin cancer    TIA (transient ischemic attack)    Past Surgical History:   Procedure Laterality Date   ABDOMINAL HYSTERECTOMY  1960's   [place] Left 12/17/2016   SINGLE FRAGMENT OF ATYPICAL EPITHELIAL CELLS   BREAST BIOPSY [facility] 04/08/2017   [facility]   BREAST EXCISIONAL BIOPSY Left 01/18/1986   Benign microcalcifications, [employer].   BREAST LUMPECTOMY Left 05/26/2017   13 mm,T1c, N0; ER/ PR+, Her 2 neu negative, HIGH RISK by Mammoprint ;  Surgeon: [name], MD;  Location: [facility] ORS;  Service: General;  Laterality: Left;   [place] PROPOFOL N/A 09/12/2015   Procedure: COLONOSCOPY WITH PROPOFOL;  Surgeon: [name], MD;  [facility]: [facility] ENDOSCOPY;  Service: Endoscopy;  Laterality: N/A;   ERCP N/A 02/21/2021   Procedure: ENDOSCOPIC RETROGRADE CHOLANGIOPANCREATOGRAPHY (ERCP);  Surgeon: Lucilla Lame, MD;  Location: [facility] ENDOSCOPY;  Service: Endoscopy;  Laterality: N/A;   IMPLANTABLE CARDIOVERTER DEFIBRILLATOR (ICD) GENERATOR CHANGE Left 03/29/2019   Procedure: PACEMAKER CHANGE OUT;  Surgeon: [name], MD;  Location: [facility] ORS;  Service: Cardiovascular;  Laterality: Left;   KIDNEY STONE SURGERY  2018   MASS EXCISION Left 02/24/2021   Procedure: EXCISION  MASS SCALP;  Surgeon: [name], MD;  Location: [facility] ORS;  Service: General;  Laterality: Left;   MOHS SURGERY  2019   forehead   pace maker  2008   [place]   TONSILLECTOMY AND ADENOIDECTOMY  3"s   Family History  Problem Relation Age of Onset   Stroke Mother    Diabetes Mother    Cancer Maternal Aunt        Breast Cancer   Breast cancer Maternal Aunt    Arthritis Maternal Grandmother    Cancer Maternal Aunt        Lung Cancer   Diabetes Son    Cancer Son    Kidney disease Neg Hx    Bladder Cancer Neg Hx    Social History   Socioeconomic History   Marital status: Married    Spouse name:  Tom   Number of children: 2   Years of education: Not on file   Highest education level: Not on file  Occupational History   Not on file  Tobacco Use   Smoking status: Former    Years: 5.00    Types: Cigarettes    Quit date: 09/14/1968    Years since quitting: 52.6   Smokeless tobacco: Never   Tobacco comments:    quit 1970  Vaping Use   Vaping Use: Never used  Substance and Sexual Activity   Alcohol use: No    Alcohol/week: 0.0 standard drinks   Drug use: No   Sexual activity: Not on file  Other Topics Concern   Not on file  Social History Narrative   Live home with husband in [place] Resource Strain: Low Risk    Difficulty of Paying Living Expenses: Not hard at all  Food Insecurity: No Food Insecurity   Worried About Charity fundraiser in the Last Year: Never true   Arboriculturist in the Last Year: Never true  Transportation Needs: No Transportation Needs   Lack of Transportation (Medical): No   Lack of Transportation (Non-Medical): No  Physical Activity: Insufficiently Active   Days of Exercise per Week: 2 days   Minutes of Exercise per Session: 20 min  Stress: No Stress Concern Present   Feeling of Stress : Not at all  Social Connections: Moderately Integrated   Frequency of Communication with Friends and Family: Three times a week   Frequency of Social Gatherings with Friends and Family: Twice a week   Attends Religious Services: More than 4 times per year   Active Member of [employer] or Organizations: No   Attends Archivist Meetings: Never   Marital Status: Married     Review of Systems  Constitutional:  Negative for appetite change and unexpected weight change.  HENT:  Negative for congestion and sinus pressure.   Respiratory:  Negative for cough, chest tightness and shortness of breath.   Cardiovascular:  Negative for chest pain, palpitations and leg swelling.  Gastrointestinal:  Negative  for abdominal pain, diarrhea, nausea and vomiting.  Genitourinary:  Negative for difficulty urinating and dysuria.  Musculoskeletal:  Negative for joint swelling and myalgias.  Skin:  Negative for color change and rash.  Neurological:  Negative for dizziness, light-headedness and headaches.  Psychiatric/Behavioral:  Negative for dysphoric mood.        Seeing Dr Nicolasa Ducking for f/u anxiety.        Objective:    Physical Exam Vitals  reviewed.  Constitutional:      General: She is not in acute distress.    Appearance: Normal appearance.  HENT:     Head: Normocephalic and atraumatic.     Right Ear: External ear normal.     Left Ear: External ear normal.  Eyes:     General: No scleral icterus.       Right eye: No discharge.        Left eye: No discharge.     Conjunctiva/sclera: Conjunctivae normal.  Neck:     Thyroid: No thyromegaly.  Cardiovascular:     Rate and Rhythm: Normal rate and regular rhythm.  Pulmonary:     Effort: No respiratory distress.     Breath sounds: Normal breath sounds. No wheezing.  Abdominal:     General: Bowel sounds are normal.     Palpations: Abdomen is soft.     Tenderness: There is no abdominal tenderness.  Musculoskeletal:        General: No swelling or tenderness.     Cervical back: Neck supple. No tenderness.  Lymphadenopathy:     Cervical: No cervical adenopathy.  Skin:    Findings: No erythema or rash.  Neurological:     Mental Status: She is alert.  Psychiatric:        Mood and Affect: Mood normal.        Behavior: Behavior normal.    BP 128/62   Pulse 86   Temp 98 F (36.7 C)   Ht 5' 2.99" (1.6 m)   Wt 114 lb (51.7 kg)   LMP  (LMP Unknown)   SpO2 96%   BMI 20.20 kg/m  Wt Readings from Last 3 Encounters:  05/01/21 114 lb (51.7 kg)  03/25/21 114 lb (51.7 kg)  02/24/21 114 lb 13.8 oz (52.1 kg)    Outpatient Encounter Medications as of 05/01/2021  Medication Sig   acetaminophen (TYLENOL) 325 MG tablet Take 2 tablets (650 mg total)  by mouth every 6 (six) hours as needed for mild pain (or Fever >/= 101).   Aflibercept 2 MG/0.05ML SOLN 1 each by Intravitreal route every 8 (eight) weeks.   apixaban (ELIQUIS) 2.5 MG TABS tablet Take 1 tablet (2.5 mg total) by mouth 2 (two) times daily.   atorvastatin (LIPITOR) 40 MG tablet Take 1 tablet (40 mg total) by mouth daily.   Biotin w/ Vitamins C & E (HAIR/SKIN/NAILS PO) Take 3 tablets by mouth daily. 2 in the am, 1 in the pm   blood glucose meter kit and supplies Per patient please dispense One touch Ultra2 meter.Use meter and supplies three times a day to check blood sugar. ICD10: E11.40   Blood Glucose Monitoring Suppl (FREESTYLE LITE) DEVI 2 (two) times daily.   Cholecalciferol (VITAMIN D) 50 MCG (2000 UT) CAPS Take 4,000 Units by mouth daily.   denosumab (PROLIA) 60 MG/ML SOSY injection Inject 60 mg into the skin every 6 (six) months.   divalproex (DEPAKOTE ER) 250 MG 24 hr tablet Take 250 mg by mouth daily.   furosemide (LASIX) 20 MG tablet Take 1 tablet (20 mg total) by mouth daily.   glucose blood (FREESTYLE LITE) test strip Used to check blood sugars twice a day. DX E11.9   isosorbide mononitrate (IMDUR) 30 MG 24 hr tablet Take 1 tablet (30 mg total) by mouth daily.   Lancets (FREESTYLE) lancets 2 (two) times daily.   Magnesium 200 MG TABS Take 200 mg by mouth 2 (two) times a day.   meclizine (  ANTIVERT) 12.5 MG tablet Take 1 tablet (12.5 mg total) by mouth 2 (two) times daily as needed for dizziness.   metFORMIN (GLUCOPHAGE-XR) 500 MG 24 hr tablet Take 1 tablet by mouth twice daily   metoprolol tartrate (LOPRESSOR) 25 MG tablet Take 25 mg by mouth 2 (two) times daily.   mirtazapine (REMERON) 15 MG tablet Take 15 mg by mouth at bedtime.   Multiple Vitamins-Minerals (PRESERVISION AREDS 2 PO) Take 1 tablet by mouth 2 (two) times daily.    mupirocin ointment (BACTROBAN) 2 % Apply 1 application topically daily.   Polyethyl Glycol-Propyl Glycol (SYSTANE OP) Apply 1 drop to eye 2  (two) times daily.   Probiotic Product (PROBIOTIC DAILY PO) Take by mouth.   tamoxifen (NOLVADEX) 20 MG tablet Take 1 tablet by mouth once daily   vitamin B-12 (CYANOCOBALAMIN) 500 MCG tablet Take 500 mcg by mouth 2 (two) times a day.   [DISCONTINUED] OVER THE COUNTER MEDICATION Take 2 tablets by mouth in the morning and at bedtime. Energizing iron tablets   No facility-administered encounter medications on file as of 05/01/2021.     Lab Results  Component Value Date   WBC 6.9 02/25/2021   HGB 9.5 (L) 02/25/2021   HCT 29.5 (L) 02/25/2021   PLT 137 (L) 02/25/2021   GLUCOSE 143 (H) 02/25/2021   CHOL 99 07/23/2020   TRIG 60 07/23/2020   HDL 41 07/23/2020   LDLCALC 46 07/23/2020   ALT 22 02/25/2021   AST 35 02/25/2021   NA 140 02/25/2021   K 4.0 02/25/2021   CL 110 02/25/2021   CREATININE 0.60 02/25/2021   BUN 10 02/25/2021   CO2 25 02/25/2021   TSH 2.46 01/28/2021   INR 1.4 (H) 12/26/2020   HGBA1C 5.7 (H) 02/22/2021   MICROALBUR <0.7 05/01/2021    MM 3D SCREEN BREAST BILATERAL  Result Date: 04/04/2021 CLINICAL DATA:  Screening. EXAM: DIGITAL SCREENING BILATERAL MAMMOGRAM WITH TOMOSYNTHESIS AND CAD TECHNIQUE: Bilateral screening digital craniocaudal and mediolateral oblique mammograms were obtained. Bilateral screening digital breast tomosynthesis was performed. The images were evaluated with computer-aided detection. COMPARISON:  Previous exam(s). ACR Breast Density Category b: There are scattered areas of fibroglandular density. FINDINGS: There are no findings suspicious for malignancy. IMPRESSION: No mammographic evidence of malignancy. A result letter of this screening mammogram will be mailed directly to the patient. RECOMMENDATION: Screening mammogram in one year. (Code:[id number]-B-01Y) BI-RADS CATEGORY  1: Negative. Electronically Signed   By: Lajean Manes M.D.   On: 04/04/2021 15:14      Assessment & Plan:   Problem List Items Addressed This Visit     Aortic atherosclerosis  ([place])    Continue lipitor.       Atrial fibrillation ([facility])    Continue eliquis and metoprolol.  Stable.  Follow.       Breast cancer, left ([place])    S/p XRT. Continue tamoxifen.       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  Currently without chest pain.  Follow.        Emphysema lung ([facility])    Changes c/w emphysema - noted on CT.  Breathing stable.  No increased cough or congestion.  No sob.  Follow.       Hyperlipidemia    Continue lipitor.  Follow lipid panel and liver function tests.       Stress    Has good support.  Follow.       Type II  diabetes mellitus ([place]) - Primary    Continue metformin - given elevated blood sugars.  Eating.  Follow met b and a1c.       Relevant Orders   Microalbumin / creatinine urine ratio (Completed)   Visual hallucinations    Continues on depakote and remeron.  Followed by Dr Nicolasa Ducking.  Stable.          [name], MD

## 2021-05-02 ENCOUNTER — Encounter: Payer: Self-pay | Admitting: Internal Medicine

## 2021-05-02 DIAGNOSIS — F39 Unspecified mood [affective] disorder: Secondary | ICD-10-CM | POA: Diagnosis not present

## 2021-05-02 DIAGNOSIS — F5105 Insomnia due to other mental disorder: Secondary | ICD-10-CM | POA: Diagnosis not present

## 2021-05-02 LAB — MICROALBUMIN / CREATININE URINE RATIO
Creatinine,U: 22 mg/dL
Microalb Creat Ratio: 3.2 mg/g (ref 0.0–30.0)
Microalb, Ur: <0.7 mg/dL (ref 0.0–1.9)

## 2021-05-05 ENCOUNTER — Encounter: Payer: Self-pay | Admitting: Internal Medicine

## 2021-05-05 NOTE — Telephone Encounter (Signed)
Do we have small butterfly needles?  I do not know.  Please notify and if she is going to get labs here than needs attached labs - cbc, met c and valproic acid level (total plus free).  Also needs a1c ,B12, ferritin and lipid panel.

## 2021-05-06 ENCOUNTER — Telehealth: Payer: Self-pay

## 2021-05-06 DIAGNOSIS — D649 Anemia, unspecified: Secondary | ICD-10-CM

## 2021-05-06 DIAGNOSIS — Z79899 Other long term (current) drug therapy: Secondary | ICD-10-CM

## 2021-05-06 DIAGNOSIS — E1165 Type 2 diabetes mellitus with hyperglycemia: Secondary | ICD-10-CM

## 2021-05-06 DIAGNOSIS — E538 Deficiency of other specified B group vitamins: Secondary | ICD-10-CM

## 2021-05-06 DIAGNOSIS — D509 Iron deficiency anemia, unspecified: Secondary | ICD-10-CM

## 2021-05-06 DIAGNOSIS — R413 Other amnesia: Secondary | ICD-10-CM

## 2021-05-06 DIAGNOSIS — E7800 Pure hypercholesterolemia, unspecified: Secondary | ICD-10-CM

## 2021-05-06 NOTE — Telephone Encounter (Signed)
cbc, met c and valproic acid level (total plus free).  Also needs a1c ,B12, ferritin and lipid panel.  valpor

## 2021-05-11 ENCOUNTER — Encounter: Payer: Self-pay | Admitting: Internal Medicine

## 2021-05-11 DIAGNOSIS — I700 Atherosclerosis of aorta: Secondary | ICD-10-CM | POA: Insufficient documentation

## 2021-05-11 DIAGNOSIS — J439 Emphysema, unspecified: Secondary | ICD-10-CM | POA: Insufficient documentation

## 2021-05-11 NOTE — Assessment & Plan Note (Signed)
Continue eliquis and metoprolol.  Stable.  Follow.

## 2021-05-11 NOTE — Assessment & Plan Note (Signed)
Continue lipitor.  Follow lipid panel and liver function tests.   

## 2021-05-11 NOTE — Assessment & Plan Note (Signed)
S/p XRT. Continue tamoxifen.

## 2021-05-11 NOTE — Assessment & Plan Note (Signed)
Continue metformin - given elevated blood sugars.  Eating.  Follow met b and a1c.  

## 2021-05-11 NOTE — Assessment & Plan Note (Signed)
Has good support.  Follow.

## 2021-05-11 NOTE — Assessment & Plan Note (Signed)
Changes c/w emphysema - noted on CT.  Breathing stable.  No increased cough or congestion.  No sob.  Follow.

## 2021-05-11 NOTE — Assessment & Plan Note (Signed)
Continue isosorbide, metoprolol and lipitor.  Currently without chest pain.  Follow.

## 2021-05-11 NOTE — Assessment & Plan Note (Signed)
Continue lipitor  ?

## 2021-05-11 NOTE — Assessment & Plan Note (Signed)
Continues on depakote and remeron.  Followed by Dr Nicolasa Ducking.  Stable.

## 2021-05-13 DIAGNOSIS — H353221 Exudative age-related macular degeneration, left eye, with active choroidal neovascularization: Secondary | ICD-10-CM | POA: Diagnosis not present

## 2021-05-15 ENCOUNTER — Telehealth: Payer: Self-pay | Admitting: *Deleted

## 2021-05-15 ENCOUNTER — Other Ambulatory Visit (INDEPENDENT_AMBULATORY_CARE_PROVIDER_SITE_OTHER): Payer: Medicare Other

## 2021-05-15 ENCOUNTER — Telehealth: Payer: Self-pay | Admitting: Internal Medicine

## 2021-05-15 ENCOUNTER — Other Ambulatory Visit: Payer: Self-pay

## 2021-05-15 DIAGNOSIS — Z79899 Other long term (current) drug therapy: Secondary | ICD-10-CM

## 2021-05-15 DIAGNOSIS — D509 Iron deficiency anemia, unspecified: Secondary | ICD-10-CM | POA: Diagnosis not present

## 2021-05-15 DIAGNOSIS — I25110 Atherosclerotic heart disease of native coronary artery with unstable angina pectoris: Secondary | ICD-10-CM | POA: Diagnosis not present

## 2021-05-15 DIAGNOSIS — R413 Other amnesia: Secondary | ICD-10-CM

## 2021-05-15 DIAGNOSIS — E538 Deficiency of other specified B group vitamins: Secondary | ICD-10-CM

## 2021-05-15 DIAGNOSIS — I4811 Longstanding persistent atrial fibrillation: Secondary | ICD-10-CM | POA: Diagnosis not present

## 2021-05-15 DIAGNOSIS — E1165 Type 2 diabetes mellitus with hyperglycemia: Secondary | ICD-10-CM | POA: Diagnosis not present

## 2021-05-15 DIAGNOSIS — I214 Non-ST elevation (NSTEMI) myocardial infarction: Secondary | ICD-10-CM | POA: Diagnosis not present

## 2021-05-15 DIAGNOSIS — E7800 Pure hypercholesterolemia, unspecified: Secondary | ICD-10-CM

## 2021-05-15 DIAGNOSIS — I495 Sick sinus syndrome: Secondary | ICD-10-CM | POA: Diagnosis not present

## 2021-05-15 DIAGNOSIS — I341 Nonrheumatic mitral (valve) prolapse: Secondary | ICD-10-CM | POA: Diagnosis not present

## 2021-05-15 LAB — CBC WITH DIFFERENTIAL/PLATELET
Basophils Absolute: 0 10*3/uL (ref 0.0–0.1)
Basophils Relative: 0.7 % (ref 0.0–3.0)
Eosinophils Absolute: 0 10*3/uL (ref 0.0–0.7)
Eosinophils Relative: 0.9 % (ref 0.0–5.0)
HCT: 25.3 % — ABNORMAL LOW (ref 36.0–46.0)
Hemoglobin: 8 g/dL — CL (ref 12.0–15.0)
Lymphocytes Relative: 29.7 % (ref 12.0–46.0)
Lymphs Abs: 1.1 10*3/uL (ref 0.7–4.0)
MCHC: 31.6 g/dL (ref 30.0–36.0)
MCV: 90.7 fl (ref 78.0–100.0)
Monocytes Absolute: 0.3 10*3/uL (ref 0.1–1.0)
Monocytes Relative: 9.1 % (ref 3.0–12.0)
Neutro Abs: 2.2 10*3/uL (ref 1.4–7.7)
Neutrophils Relative %: 59.6 % (ref 43.0–77.0)
Platelets: 144 10*3/uL — ABNORMAL LOW (ref 150.0–400.0)
RBC: 2.79 Mil/uL — ABNORMAL LOW (ref 3.87–5.11)
RDW: 16.9 % — ABNORMAL HIGH (ref 11.5–15.5)
WBC: 3.7 10*3/uL — ABNORMAL LOW (ref 4.0–10.5)

## 2021-05-15 LAB — LIPID PANEL
Cholesterol: 94 mg/dL (ref 0–200)
HDL: 48.7 mg/dL
LDL Cholesterol: 37 mg/dL (ref 0–99)
NonHDL: 45.35
Total CHOL/HDL Ratio: 2
Triglycerides: 41 mg/dL (ref 0.0–149.0)
VLDL: 8.2 mg/dL (ref 0.0–40.0)

## 2021-05-15 LAB — COMPREHENSIVE METABOLIC PANEL
ALT: 17 U/L (ref 0–35)
AST: 24 U/L (ref 0–37)
Albumin: 3.2 g/dL — ABNORMAL LOW (ref 3.5–5.2)
Alkaline Phosphatase: 34 U/L — ABNORMAL LOW (ref 39–117)
BUN: 23 mg/dL (ref 6–23)
CO2: 28 mEq/L (ref 19–32)
Calcium: 9.7 mg/dL (ref 8.4–10.5)
Chloride: 101 mEq/L (ref 96–112)
Creatinine, Ser: 0.72 mg/dL (ref 0.40–1.20)
GFR: 74.09 mL/min (ref >60.00)
Glucose, Bld: 141 mg/dL — ABNORMAL HIGH (ref 70–99)
Potassium: 4.3 mEq/L (ref 3.5–5.1)
Sodium: 135 mEq/L (ref 135–145)
Total Bilirubin: 0.3 mg/dL (ref 0.2–1.2)
Total Protein: 6.9 g/dL (ref 6.0–8.3)

## 2021-05-15 LAB — FERRITIN: Ferritin: 5.5 ng/mL — ABNORMAL LOW (ref 10.0–291.0)

## 2021-05-15 LAB — VITAMIN B12: Vitamin B-12: >1550 pg/mL — ABNORMAL HIGH (ref 211–911)

## 2021-05-15 LAB — HEMOGLOBIN A1C: Hgb A1c MFr Bld: 5.9 % (ref 4.6–6.5)

## 2021-05-15 NOTE — Telephone Encounter (Signed)
Notify - iron deficient.  I did talk to Dr Grayland Ormond. His office will be calling with an appt.  I want her to start ferrous sulfate '325mg'$  bid.  If any problems, let us know.  If any acute change, needs to be evaluated.

## 2021-05-15 NOTE — Telephone Encounter (Signed)
Patient dropped off her paperwork for her handicap placcard.It is up front in Dr.Scott's color folder.

## 2021-05-15 NOTE — Telephone Encounter (Signed)
Please call [name] and notify that Ms Maharaj hgb has decreased - 8.0.  iron is low.  Is she taking any iron supplements?  Any bleeding or GI symptoms?  I am going to notify Dr Grayland Ormond of results as well.  Her overall sugar control is ok.  Cholesterol ok.  B12 ok.

## 2021-05-15 NOTE — Telephone Encounter (Signed)
CRITICAL VALUE STICKER  CRITICAL VALUE: Hemoglobin- 8.0 (L)  RECEIVER (on-site recipient of call): Jari Favre, CMA/XT  DATE & TIME NOTIFIED: 05/15/21 @ 9:44am  MESSENGER (representative from lab): Flonnie Overman  MD NOTIFIED: Dr.Scott  TIME OF NOTIFICATION: 9:53am   RESPONSE: see results

## 2021-05-15 NOTE — Telephone Encounter (Signed)
I called and spoke with [name]. She stated that she does not take any iron supplements & she also not having any GI symptoms that they are aware of. I let them know that you were also notifying Dr. Grayland Ormond.

## 2021-05-15 NOTE — Telephone Encounter (Signed)
Handicap form in quick sign

## 2021-05-15 NOTE — Telephone Encounter (Signed)
I spoke with Holly Rose & she stated that she would get patient the OTC ferrous sulfate '325mg'$  to take BID. Also advised that if any acute changes that patient needs to be evaluated. They will be expecting a call from Dr. Gary Fleet office.

## 2021-05-16 LAB — VALPROIC ACID LEVEL: Valproic Acid Lvl: 22.1 mg/L — ABNORMAL LOW (ref 50.0–100.0)

## 2021-05-16 NOTE — Telephone Encounter (Signed)
Singed and placed in box.

## 2021-05-16 NOTE — Telephone Encounter (Signed)
Mailed to daughter in law per request.

## 2021-05-20 DIAGNOSIS — H353211 Exudative age-related macular degeneration, right eye, with active choroidal neovascularization: Secondary | ICD-10-CM | POA: Diagnosis not present

## 2021-05-21 ENCOUNTER — Encounter: Payer: Self-pay | Admitting: Podiatry

## 2021-05-21 ENCOUNTER — Other Ambulatory Visit: Payer: Self-pay

## 2021-05-21 ENCOUNTER — Ambulatory Visit (INDEPENDENT_AMBULATORY_CARE_PROVIDER_SITE_OTHER): Payer: Medicare Other | Admitting: Podiatry

## 2021-05-21 DIAGNOSIS — I25119 Atherosclerotic heart disease of native coronary artery with unspecified angina pectoris: Secondary | ICD-10-CM

## 2021-05-21 DIAGNOSIS — M79609 Pain in unspecified limb: Secondary | ICD-10-CM

## 2021-05-21 DIAGNOSIS — D689 Coagulation defect, unspecified: Secondary | ICD-10-CM

## 2021-05-21 DIAGNOSIS — D2372 Other benign neoplasm of skin of left lower limb, including hip: Secondary | ICD-10-CM | POA: Diagnosis not present

## 2021-05-21 DIAGNOSIS — E1142 Type 2 diabetes mellitus with diabetic polyneuropathy: Secondary | ICD-10-CM | POA: Diagnosis not present

## 2021-05-21 DIAGNOSIS — B351 Tinea unguium: Secondary | ICD-10-CM | POA: Diagnosis not present

## 2021-05-21 DIAGNOSIS — L97521 Non-pressure chronic ulcer of other part of left foot limited to breakdown of skin: Secondary | ICD-10-CM | POA: Diagnosis not present

## 2021-05-21 DIAGNOSIS — D2371 Other benign neoplasm of skin of right lower limb, including hip: Secondary | ICD-10-CM

## 2021-05-21 MED ORDER — CLINDAMYCIN HCL 150 MG PO CAPS: 150.0000 mg | ORAL_CAPSULE | Freq: Three times a day (TID) | ORAL | 1 refills | Status: DC

## 2021-05-21 MED ORDER — MUPIROCIN 2 % EX OINT: 1.0000 "application " | TOPICAL_OINTMENT | Freq: Two times a day (BID) | CUTANEOUS | 0 refills | Status: DC

## 2021-05-21 NOTE — Progress Notes (Signed)
She presents today chief complaint of painful elongated toenails of the painful toe hallux left.  Her daughter-in-law explains that her toe is rubbing the end of her shoe and it is causing it to develop an ulcer.  Objective: Vital signs are stable alert and oriented x3.  Pulses are palpable.  She has a long thick yellow dystrophic-like mycotic nails right foot is good left foot demonstrates hallux that is extended at the level of the interphalangeal joint due to a previous stroke that she has had that causes the extensor tendon to pull aggressively.  This wound does encompassed the majority of the distal aspect of her great toe there is no purulence no malodor I debrided a lot of skin here it does not probe to bone.  Her toenails are long thick yellow dystrophic onychomycotic I trimmed all of those trimmed all of the remaining benign skin lesions plantar aspect of the foot.  Assessment: Ulceration hallux left due to an extended hallux, benign skin lesions bilateral and pain in limb secondary to onychomycosis.  Plan: Discussed etiology pathology and surgical therapies debrided all reactive hyperkeratotic tissue for her today placed padding for her today.  Also provided her with Bactroban ointment and clindamycin.  Discussed open toed shoes we will follow-up with her in t 2 weeks.

## 2021-05-24 NOTE — Progress Notes (Signed)
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  INTERVAL HISTORY: Patient returns to clinic today for repeat laboratory work, further evaluation, and consideration of IV Venofer.  She has mild chronic fatigue, but otherwise feels well.  She continues to tolerate tamoxifen without significant side effects.  She has no neurologic complaints. She denies any recent fevers or illnesses. She has a good appetite and denies weight loss.  She denies any chest pain, shortness of breath, cough, or hemoptysis.  She denies any nausea, vomiting, constipation, or diarrhea. She has no urinary complaints.  Patient offers no further specific complaints today.  REVIEW OF SYSTEMS:   Review of Systems  Constitutional:  Positive for malaise/fatigue. Negative for fever and weight loss.  Respiratory: Negative.  Negative for cough and shortness of breath.   Cardiovascular: Negative.  Negative for chest pain and leg swelling.  Gastrointestinal: Negative.  Negative for abdominal pain, blood in stool and melena.  Genitourinary: Negative.  Negative for dysuria.  Musculoskeletal: Negative.  Negative for back pain.  Skin: Negative.  Negative for rash.  Neurological: Negative.  Negative for sensory change, focal weakness, weakness and headaches.  Psychiatric/Behavioral: Negative.  The patient is not nervous/anxious.    As per HPI. Otherwise, a complete review of systems is negative.  PAST MEDICAL HISTORY: Past Medical History:  Diagnosis Date   Allergy    Anemia     Arthritis    Atrial fibrillation ([place]) 2004   Breast cancer, left ([place]) 10/2017   Lumpectomy and rad tx's.    CAD (coronary artery disease)    Complication of anesthesia    hard to wake up with general anesthesia   Diabetes ([place])    Dysrhythmia    A-fib   Heart attack ([place]) 2008   Heart murmur    History of kidney stones    History of sick sinus syndrome    s/p pacemaker placement   Hyperlipidemia    Macular degeneration    Neuropathy    Neuropathy    Pacemaker 06/2010   Personal history of radiation therapy 11/2017   LEFT lumpectomy    Squamous cell skin cancer    TIA (transient ischemic attack)     PAST SURGICAL HISTORY: Past Surgical History:  Procedure Laterality Date   ABDOMINAL HYSTERECTOMY  1960's   [place] Left 12/17/2016   SINGLE FRAGMENT OF ATYPICAL EPITHELIAL CELLS   BREAST BIOPSY Left 04/08/2017   [facility]   BREAST EXCISIONAL BIOPSY Left 01/18/1986   Benign microcalcifications, [employer].   BREAST LUMPECTOMY Left 05/26/2017   13 mm,T1c, N0; ER/ PR+, Her 2 neu negative, HIGH RISK by Mammoprint ;  Surgeon: [name], MD;  Location: [facility] ORS;  Service: General;  Laterality: Left;   [place] PROPOFOL N/A 09/12/2015   Procedure: COLONOSCOPY WITH PROPOFOL;  Surgeon: [name], MD;  Location: [facility] ENDOSCOPY;  Service: Endoscopy;  Laterality: N/A;  ERCP N/A 02/21/2021   Procedure: ENDOSCOPIC RETROGRADE CHOLANGIOPANCREATOGRAPHY (ERCP);  Surgeon: Lucilla Lame, MD;  Location: [facility] ENDOSCOPY;  Service: Endoscopy;  Laterality: N/A;   IMPLANTABLE CARDIOVERTER DEFIBRILLATOR (ICD) GENERATOR CHANGE Left 03/29/2019   Procedure: PACEMAKER CHANGE OUT;  Surgeon: [name], MD;  Location: [facility] ORS;  Service: Cardiovascular;  Laterality: Left;   KIDNEY STONE SURGERY  2018   MASS EXCISION  Left 02/24/2021   Procedure: EXCISION MASS SCALP;  Surgeon: [name], MD;  Location: [facility] ORS;  Service: General;  Laterality: Left;   MOHS SURGERY  2019   forehead   pace maker  2008   [place]   TONSILLECTOMY AND ADENOIDECTOMY  1950"s    FAMILY HISTORY: Family History  Problem Relation Age of Onset   Stroke Mother    Diabetes Mother    Cancer Maternal Aunt        Breast Cancer   Breast cancer Maternal Aunt    Arthritis Maternal Grandmother    Cancer Maternal Aunt        Lung Cancer   Diabetes Son    Cancer Son    Kidney disease Neg Hx    Bladder Cancer Neg Hx     ADVANCED DIRECTIVES (Y/N):  N  HEALTH MAINTENANCE: Social History   Tobacco Use   Smoking status: Former    Years: 5.00    Types: Cigarettes    Quit date: 09/14/1968    Years since quitting: 52.7   Smokeless tobacco: Never   Tobacco comments:    quit 1970  Vaping Use   Vaping Use: Never used  Substance Use Topics   Alcohol use: No    Alcohol/week: 0.0 standard drinks   Drug use: No     Colonoscopy:  PAP:  Bone density:  Lipid panel:  Allergies  Allergen Reactions   Lyrica [Pregabalin] Swelling    Sleepy, does not eat   Neurontin [Gabapentin] Nausea And Vomiting and Other (See Comments)    disoriented   Bactrim [Sulfamethoxazole-Trimethoprim]     Made her dizziness    Current Outpatient Medications  Medication Sig Dispense Refill   acetaminophen (TYLENOL) 325 MG tablet Take 2 tablets (650 mg total) by mouth every 6 (six) hours as needed for mild pain (or Fever >/= 101).     Aflibercept 2 MG/0.05ML SOLN 1 each by Intravitreal route every 8 (eight) weeks.     apixaban (ELIQUIS) 2.5 MG TABS tablet Take 1 tablet (2.5 mg total) by mouth 2 (two) times daily. 60 tablet 1   atorvastatin (LIPITOR) 40 MG tablet Take 1 tablet (40 mg total) by mouth daily. 30 tablet 0   Biotin w/ Vitamins C & E (HAIR/SKIN/NAILS PO) Take 3 tablets by mouth daily. 2 in the am, 1 in the pm      blood glucose meter kit and supplies Per patient please dispense One touch Ultra2 meter.Use meter and supplies three times a day to check blood sugar. ICD10: E11.40 1 each 0   Blood Glucose Monitoring Suppl (FREESTYLE LITE) DEVI 2 (two) times daily.     busPIRone (BUSPAR) 5 MG tablet Take 5 mg by mouth daily as needed.     Cholecalciferol (VITAMIN D) 50 MCG (2000 UT) CAPS Take 4,000 Units by mouth daily.     clindamycin (CLEOCIN) 150 MG capsule Take 1 capsule (150 mg total) by mouth 3 (three) times daily. 30 capsule 1   denosumab (PROLIA) 60 MG/ML SOSY injection Inject 60 mg into the skin every 6 (  six) months.     divalproex (DEPAKOTE ER) 500 MG 24 hr tablet Take 500 mg by mouth daily.     furosemide (LASIX) 20 MG tablet Take 1 tablet (20 mg total) by mouth daily.     glucose blood (FREESTYLE LITE) test strip Used to check blood sugars twice a day. DX E11.9 100 each 12   isosorbide mononitrate (IMDUR) 30 MG 24 hr tablet Take 1 tablet (30 mg total) by mouth daily. 30 tablet 0   Lancets (FREESTYLE) lancets 2 (two) times daily.     Magnesium 200 MG TABS Take 200 mg by mouth 2 (two) times a day.     meclizine (ANTIVERT) 12.5 MG tablet Take 1 tablet (12.5 mg total) by mouth 2 (two) times daily as needed for dizziness. 14 tablet 0   metFORMIN (GLUCOPHAGE-XR) 500 MG 24 hr tablet Take 1 tablet by mouth twice daily 90 tablet 0   metoprolol tartrate (LOPRESSOR) 25 MG tablet Take 25 mg by mouth 2 (two) times daily.     mirtazapine (REMERON) 15 MG tablet Take 15 mg by mouth at bedtime.     Multiple Vitamins-Minerals (PRESERVISION AREDS 2 PO) Take 1 tablet by mouth 2 (two) times daily.      mupirocin ointment (BACTROBAN) 2 % Apply 1 application topically 2 (two) times daily. 22 g 0   Polyethyl Glycol-Propyl Glycol (SYSTANE OP) Apply 1 drop to eye 2 (two) times daily.     Probiotic Product (PROBIOTIC DAILY PO) Take by mouth.     tamoxifen (NOLVADEX) 20 MG tablet Take 1 tablet by mouth once daily 90 tablet 3    vitamin B-12 (CYANOCOBALAMIN) 500 MCG tablet Take 500 mcg by mouth 2 (two) times a day.     No current facility-administered medications for this visit.    OBJECTIVE: Vitals:   05/29/21 1334  BP: (!) 96/48  Pulse: 73  Resp: 16  Temp: 97.7 F (36.5 C)  SpO2: 98%     Body mass index is 20.15 kg/m.    ECOG FS:1 - Symptomatic but completely ambulatory  General: Well-developed, well-nourished, no acute distress.  Sitting in a wheelchair. Eyes: Pink conjunctiva, anicteric sclera. HEENT: Normocephalic, moist mucous membranes. Lungs: No audible wheezing or coughing. Heart: Regular rate and rhythm. Abdomen: Soft, nontender, no obvious distention. Musculoskeletal: No edema, cyanosis, or clubbing. Neuro: Alert, answering all questions appropriately. Cranial nerves grossly intact. Skin: No rashes or petechiae noted. Psych: Normal affect.    LAB RESULTS:  Lab Results  Component Value Date   NA 135 05/15/2021   K 4.3 05/15/2021   CL 101 05/15/2021   CO2 28 05/15/2021   GLUCOSE 141 (H) 05/15/2021   BUN 23 05/15/2021   CREATININE 0.72 05/15/2021   CALCIUM 9.7 05/15/2021   PROT 6.9 05/15/2021   ALBUMIN 3.2 (L) 05/15/2021   AST 24 05/15/2021   ALT 17 05/15/2021   ALKPHOS 34 (L) 05/15/2021   BILITOT 0.3 05/15/2021   GFRNONAA >60 02/25/2021   GFRAA >60 04/17/2020    Lab Results  Component Value Date   WBC 4.3 05/29/2021   NEUTROABS 2.4 05/29/2021   HGB 8.9 (L) 05/29/2021   HCT 28.9 (L) 05/29/2021   MCV 94.1 05/29/2021   PLT 150 05/29/2021   Lab Results  Component Value Date   IRON 57 05/29/2021   TIBC 365 05/29/2021   IRONPCTSAT 16 05/29/2021   Lab Results  Component Value Date   FERRITIN 8 (L) 05/29/2021     STUDIES: No results found.  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  PLAN:    1.  Pathologic stage Ia ER/PR positive, HER-2 negative invasive carcinoma of the  upper outer quadrant of the left breast: Although patient had high risk Mammaprint, it was previously decided by the patient that given her advanced age that pursuing chemotherapy may be more detrimental than beneficial.  She completed adjuvant XRT. Given patient's osteoporosis, she was placed on tamoxifen which she will continue for a minimum of 5 years completing in 2024.  Given her high risk MammaPrint, could possibly consider extending treatment for 7 to 10 years.  Patient's most recent mammogram on April 03, 2021 was reported as BI-RADS 2.  Repeat in July 2023.     2.  Osteoporosis: Patient's most recent bone mineral density on December 05, 2020 was significantly worse with a T score of -3.4.  Fosamax was discontinued and patient received Prolia in May 2022.  Her next dose will occur in approximately November 2022, but this can be given at her next clinic visit in 4 months.  Continue calcium and vitamin D supplementation.  Repeat bone mineral density in March 2023.   3.  Iron deficiency anemia: Patient's hemoglobin and ferritin have trended down.  Proceed with 200 mg IV Venofer today.  Patient will return to clinic 4 times over the next 2 weeks to receive additional infusions.  She would then return to clinic in 4 months with repeat laboratory work, further evaluation, and consideration of treatment if needed.  Patient expressed understanding and was in agreement with this plan. She also understands that She can call clinic at any time with any questions, concerns, or complaints.   Cancer Staging Breast cancer, left [facility]) Staging form: Breast, AJCC 8th Edition - Pathologic stage from 06/27/2017: Stage IA (pT1c, pN0, cM0, G1, ER+, PR+, HER2-) - Signed by [name], MD on 06/27/2017 Neoadjuvant therapy: No Multigene prognostic tests performed: MammaPrint Histologic grading system: 3 grade system Laterality: Left   [name], MD   05/30/2021 5:21 PM

## 2021-05-28 ENCOUNTER — Other Ambulatory Visit: Payer: Self-pay

## 2021-05-28 DIAGNOSIS — D509 Iron deficiency anemia, unspecified: Secondary | ICD-10-CM

## 2021-05-28 DIAGNOSIS — D649 Anemia, unspecified: Secondary | ICD-10-CM

## 2021-05-29 ENCOUNTER — Inpatient Hospital Stay (HOSPITAL_BASED_OUTPATIENT_CLINIC_OR_DEPARTMENT_OTHER): Payer: Medicare Other | Admitting: Oncology

## 2021-05-29 ENCOUNTER — Other Ambulatory Visit: Payer: Self-pay

## 2021-05-29 ENCOUNTER — Inpatient Hospital Stay: Payer: Medicare Other

## 2021-05-29 ENCOUNTER — Encounter: Payer: Self-pay | Admitting: Oncology

## 2021-05-29 ENCOUNTER — Inpatient Hospital Stay: Payer: Medicare Other | Attending: Oncology

## 2021-05-29 VITALS — BP 96/48 | HR 73 | Temp 97.7°F | Resp 16 | Wt 113.7 lb

## 2021-05-29 DIAGNOSIS — I2510 Atherosclerotic heart disease of native coronary artery without angina pectoris: Secondary | ICD-10-CM | POA: Insufficient documentation

## 2021-05-29 DIAGNOSIS — R011 Cardiac murmur, unspecified: Secondary | ICD-10-CM | POA: Diagnosis not present

## 2021-05-29 DIAGNOSIS — Z170 Estrogen receptor positive status [ER+]: Secondary | ICD-10-CM | POA: Insufficient documentation

## 2021-05-29 DIAGNOSIS — I4891 Unspecified atrial fibrillation: Secondary | ICD-10-CM | POA: Diagnosis not present

## 2021-05-29 DIAGNOSIS — Z79810 Long term (current) use of selective estrogen receptor modulators (SERMs): Secondary | ICD-10-CM | POA: Diagnosis not present

## 2021-05-29 DIAGNOSIS — E119 Type 2 diabetes mellitus without complications: Secondary | ICD-10-CM | POA: Insufficient documentation

## 2021-05-29 DIAGNOSIS — R5383 Other fatigue: Secondary | ICD-10-CM | POA: Diagnosis not present

## 2021-05-29 DIAGNOSIS — Z8673 Personal history of transient ischemic attack (TIA), and cerebral infarction without residual deficits: Secondary | ICD-10-CM | POA: Diagnosis not present

## 2021-05-29 DIAGNOSIS — Z79899 Other long term (current) drug therapy: Secondary | ICD-10-CM | POA: Insufficient documentation

## 2021-05-29 DIAGNOSIS — C50412 Malignant neoplasm of upper-outer quadrant of left female breast: Secondary | ICD-10-CM | POA: Insufficient documentation

## 2021-05-29 DIAGNOSIS — E785 Hyperlipidemia, unspecified: Secondary | ICD-10-CM | POA: Insufficient documentation

## 2021-05-29 DIAGNOSIS — Z923 Personal history of irradiation: Secondary | ICD-10-CM | POA: Insufficient documentation

## 2021-05-29 DIAGNOSIS — D509 Iron deficiency anemia, unspecified: Secondary | ICD-10-CM | POA: Insufficient documentation

## 2021-05-29 DIAGNOSIS — Z801 Family history of malignant neoplasm of trachea, bronchus and lung: Secondary | ICD-10-CM | POA: Insufficient documentation

## 2021-05-29 DIAGNOSIS — Z803 Family history of malignant neoplasm of breast: Secondary | ICD-10-CM | POA: Diagnosis not present

## 2021-05-29 DIAGNOSIS — M810 Age-related osteoporosis without current pathological fracture: Secondary | ICD-10-CM | POA: Insufficient documentation

## 2021-05-29 DIAGNOSIS — Z87891 Personal history of nicotine dependence: Secondary | ICD-10-CM | POA: Insufficient documentation

## 2021-05-29 DIAGNOSIS — Z85828 Personal history of other malignant neoplasm of skin: Secondary | ICD-10-CM | POA: Insufficient documentation

## 2021-05-29 DIAGNOSIS — M129 Arthropathy, unspecified: Secondary | ICD-10-CM | POA: Diagnosis not present

## 2021-05-29 DIAGNOSIS — G629 Polyneuropathy, unspecified: Secondary | ICD-10-CM | POA: Insufficient documentation

## 2021-05-29 LAB — CBC WITH DIFFERENTIAL/PLATELET
Abs Immature Granulocytes: 0.01 10*3/uL (ref 0.00–0.07)
Basophils Absolute: 0 10*3/uL (ref 0.0–0.1)
Basophils Relative: 1 %
Eosinophils Absolute: 0.1 10*3/uL (ref 0.0–0.5)
Eosinophils Relative: 1 %
HCT: 28.9 % — ABNORMAL LOW (ref 36.0–46.0)
Hemoglobin: 8.9 g/dL — ABNORMAL LOW (ref 12.0–15.0)
Immature Granulocytes: 0 %
Lymphocytes Relative: 35 %
Lymphs Abs: 1.5 10*3/uL (ref 0.7–4.0)
MCH: 29 pg (ref 26.0–34.0)
MCHC: 30.8 g/dL (ref 30.0–36.0)
MCV: 94.1 fL (ref 80.0–100.0)
Monocytes Absolute: 0.3 10*3/uL (ref 0.1–1.0)
Monocytes Relative: 8 %
Neutro Abs: 2.4 10*3/uL (ref 1.7–7.7)
Neutrophils Relative %: 55 %
Platelets: 150 10*3/uL (ref 150–400)
RBC: 3.07 MIL/uL — ABNORMAL LOW (ref 3.87–5.11)
RDW: 17.9 % — ABNORMAL HIGH (ref 11.5–15.5)
WBC: 4.3 10*3/uL (ref 4.0–10.5)
nRBC: 0 % (ref 0.0–0.2)

## 2021-05-29 LAB — IRON AND TIBC
Iron: 57 ug/dL (ref 28–170)
Saturation Ratios: 16 % (ref 10.4–31.8)
TIBC: 365 ug/dL (ref 250–450)
UIBC: 308 ug/dL

## 2021-05-29 LAB — FERRITIN: Ferritin: 8 ng/mL — ABNORMAL LOW (ref 11–307)

## 2021-05-29 MED ORDER — SODIUM CHLORIDE 0.9 % IV SOLN
Freq: Once | INTRAVENOUS | Status: AC
  Filled 2021-05-29: qty 250

## 2021-05-29 MED ORDER — IRON SUCROSE 20 MG/ML IV SOLN
200.0000 mg | Freq: Once | INTRAVENOUS | Status: AC
  Administered 2021-05-29: 200 mg via INTRAVENOUS
  Filled 2021-05-29: qty 10

## 2021-05-29 MED ORDER — SODIUM CHLORIDE 0.9 % IV SOLN: 200.0000 mg | Freq: Once | INTRAVENOUS | Status: DC

## 2021-05-29 NOTE — Patient Instructions (Signed)

## 2021-05-29 NOTE — Progress Notes (Signed)
Pt and family have no new concerns at this time. Pt daughter states pt restarted Hemeplex 2 weeks ago.

## 2021-05-30 ENCOUNTER — Encounter: Payer: Self-pay | Admitting: Oncology

## 2021-05-30 DIAGNOSIS — Z20822 Contact with and (suspected) exposure to covid-19: Secondary | ICD-10-CM | POA: Diagnosis not present

## 2021-06-02 ENCOUNTER — Other Ambulatory Visit: Payer: Self-pay | Admitting: Internal Medicine

## 2021-06-03 ENCOUNTER — Other Ambulatory Visit: Payer: Self-pay

## 2021-06-03 ENCOUNTER — Inpatient Hospital Stay: Payer: Medicare Other

## 2021-06-03 VITALS — BP 95/58 | HR 78 | Temp 97.0°F | Resp 18

## 2021-06-03 DIAGNOSIS — D509 Iron deficiency anemia, unspecified: Secondary | ICD-10-CM | POA: Diagnosis not present

## 2021-06-03 DIAGNOSIS — C50412 Malignant neoplasm of upper-outer quadrant of left female breast: Secondary | ICD-10-CM | POA: Diagnosis not present

## 2021-06-03 DIAGNOSIS — Z79899 Other long term (current) drug therapy: Secondary | ICD-10-CM | POA: Diagnosis not present

## 2021-06-03 DIAGNOSIS — Z79810 Long term (current) use of selective estrogen receptor modulators (SERMs): Secondary | ICD-10-CM | POA: Diagnosis not present

## 2021-06-03 DIAGNOSIS — M810 Age-related osteoporosis without current pathological fracture: Secondary | ICD-10-CM | POA: Diagnosis not present

## 2021-06-03 DIAGNOSIS — Z170 Estrogen receptor positive status [ER+]: Secondary | ICD-10-CM | POA: Diagnosis not present

## 2021-06-03 MED ORDER — IRON SUCROSE 20 MG/ML IV SOLN
200.0000 mg | Freq: Once | INTRAVENOUS | Status: AC
Start: 2021-06-03 — End: 2021-06-03
  Administered 2021-06-03: 200 mg via INTRAVENOUS
  Filled 2021-06-03: qty 10

## 2021-06-03 MED ORDER — SODIUM CHLORIDE 0.9 % IV SOLN: 200.0000 mg | Freq: Once | INTRAVENOUS | Status: DC

## 2021-06-03 MED ORDER — SODIUM CHLORIDE 0.9 % IV SOLN
Freq: Once | INTRAVENOUS | Status: AC
  Filled 2021-06-03: qty 250

## 2021-06-04 ENCOUNTER — Ambulatory Visit (INDEPENDENT_AMBULATORY_CARE_PROVIDER_SITE_OTHER): Payer: Medicare Other | Admitting: Podiatry

## 2021-06-04 ENCOUNTER — Encounter: Payer: Self-pay | Admitting: Podiatry

## 2021-06-04 DIAGNOSIS — L97521 Non-pressure chronic ulcer of other part of left foot limited to breakdown of skin: Secondary | ICD-10-CM | POA: Diagnosis not present

## 2021-06-04 DIAGNOSIS — E1142 Type 2 diabetes mellitus with diabetic polyneuropathy: Secondary | ICD-10-CM

## 2021-06-04 NOTE — Progress Notes (Signed)
She presents with her daughter today for follow-up of her ulcerative lesion distal aspect of the hallux left.  She states that they have been continuing to wrap it and dress it on a daily basis.  Objective: Vital signs are stable she is alert and oriented x3 she does demonstrate a extended hallux which is resulted in a ulceration to the distal aspect of the toe.  However the erythema cellulitis drainage and odor that was present previously has subsided completely and only now demonstrates reactive hyperkeratosis surrounding the ulceration.  The ulceration was debrided today to bleeding which has improved considerably.  Assessment: Well-healing ulceration.  Plan: Debrided today redressed today dressed a compressive dressing dispensed silicone toe caps I will follow-up with her in a few weeks.

## 2021-06-06 ENCOUNTER — Other Ambulatory Visit: Payer: Self-pay

## 2021-06-06 ENCOUNTER — Inpatient Hospital Stay: Payer: Medicare Other

## 2021-06-06 VITALS — BP 108/70 | HR 84 | Temp 97.2°F | Resp 18

## 2021-06-06 DIAGNOSIS — Z170 Estrogen receptor positive status [ER+]: Secondary | ICD-10-CM | POA: Diagnosis not present

## 2021-06-06 DIAGNOSIS — C50412 Malignant neoplasm of upper-outer quadrant of left female breast: Secondary | ICD-10-CM | POA: Diagnosis not present

## 2021-06-06 DIAGNOSIS — M810 Age-related osteoporosis without current pathological fracture: Secondary | ICD-10-CM

## 2021-06-06 DIAGNOSIS — Z79899 Other long term (current) drug therapy: Secondary | ICD-10-CM | POA: Diagnosis not present

## 2021-06-06 DIAGNOSIS — D509 Iron deficiency anemia, unspecified: Secondary | ICD-10-CM | POA: Diagnosis not present

## 2021-06-06 DIAGNOSIS — Z79810 Long term (current) use of selective estrogen receptor modulators (SERMs): Secondary | ICD-10-CM | POA: Diagnosis not present

## 2021-06-06 MED ORDER — SODIUM CHLORIDE 0.9 % IV SOLN: 200.0000 mg | Freq: Once | INTRAVENOUS | Status: DC

## 2021-06-06 MED ORDER — SODIUM CHLORIDE 0.9 % IV SOLN
Freq: Once | INTRAVENOUS | Status: AC
Start: 2021-06-06 — End: 2021-06-06
  Filled 2021-06-06: qty 250

## 2021-06-06 MED ORDER — IRON SUCROSE 20 MG/ML IV SOLN
200.0000 mg | Freq: Once | INTRAVENOUS | Status: AC
  Administered 2021-06-06: 200 mg via INTRAVENOUS
  Filled 2021-06-06: qty 10

## 2021-06-09 ENCOUNTER — Inpatient Hospital Stay: Payer: Medicare Other

## 2021-06-09 ENCOUNTER — Other Ambulatory Visit: Payer: Self-pay

## 2021-06-09 VITALS — BP 103/49 | HR 81 | Temp 97.3°F | Resp 18

## 2021-06-09 DIAGNOSIS — D509 Iron deficiency anemia, unspecified: Secondary | ICD-10-CM | POA: Diagnosis not present

## 2021-06-09 DIAGNOSIS — Z79810 Long term (current) use of selective estrogen receptor modulators (SERMs): Secondary | ICD-10-CM | POA: Diagnosis not present

## 2021-06-09 DIAGNOSIS — M810 Age-related osteoporosis without current pathological fracture: Secondary | ICD-10-CM

## 2021-06-09 DIAGNOSIS — C50412 Malignant neoplasm of upper-outer quadrant of left female breast: Secondary | ICD-10-CM | POA: Diagnosis not present

## 2021-06-09 DIAGNOSIS — Z79899 Other long term (current) drug therapy: Secondary | ICD-10-CM | POA: Diagnosis not present

## 2021-06-09 DIAGNOSIS — Z170 Estrogen receptor positive status [ER+]: Secondary | ICD-10-CM | POA: Diagnosis not present

## 2021-06-09 MED ORDER — IRON SUCROSE 20 MG/ML IV SOLN
200.0000 mg | Freq: Once | INTRAVENOUS | Status: AC
  Administered 2021-06-09: 200 mg via INTRAVENOUS
  Filled 2021-06-09: qty 10

## 2021-06-09 MED ORDER — SODIUM CHLORIDE 0.9 % IV SOLN: 200.0000 mg | Freq: Once | INTRAVENOUS | Status: DC

## 2021-06-09 MED ORDER — SODIUM CHLORIDE 0.9 % IV SOLN
Freq: Once | INTRAVENOUS | Status: AC
  Filled 2021-06-09: qty 250

## 2021-06-09 NOTE — Patient Instructions (Signed)
[facility]  Discharge Instructions: Thank you for choosing [facility] to provide your oncology and hematology care.  If you have a lab appointment with the Cancer Center, please go directly to the Cancer Center and check in at the registration area.  Wear comfortable clothing and clothing appropriate for easy access to any Portacath or PICC line.   We strive to give you quality time with your provider. You may need to reschedule your appointment if you arrive late (15 or more minutes).  Arriving late affects you and other patients whose appointments are after yours.  Also, if you miss three or more appointments without notifying the office, you may be dismissed from the clinic at the provider's discretion.      For prescription refill requests, have your pharmacy contact our office and allow 72 hours for refills to be completed.    Today you received the following chemotherapy and/or immunotherapy agents VENOFER      To help prevent nausea and vomiting after your treatment, we encourage you to take your nausea medication as directed.  BELOW ARE SYMPTOMS THAT SHOULD BE REPORTED IMMEDIATELY: *FEVER GREATER THAN 100.4 F (38 C) OR HIGHER *CHILLS OR SWEATING *NAUSEA AND VOMITING THAT IS NOT CONTROLLED WITH YOUR NAUSEA MEDICATION *UNUSUAL SHORTNESS OF BREATH *UNUSUAL BRUISING OR BLEEDING *URINARY PROBLEMS (pain or burning when urinating, or frequent urination) *BOWEL PROBLEMS (unusual diarrhea, constipation, pain near the anus) TENDERNESS IN MOUTH AND THROAT WITH OR WITHOUT PRESENCE OF ULCERS (sore throat, sores in mouth, or a toothache) UNUSUAL RASH, SWELLING OR PAIN  UNUSUAL VAGINAL DISCHARGE OR ITCHING   Items with * indicate a potential emergency and should be followed up as soon as possible or go to the Emergency Department if any problems should occur.  Please show the CHEMOTHERAPY ALERT CARD or IMMUNOTHERAPY ALERT CARD at check-in to  the Emergency Department and triage nurse.  Should you have questions after your visit or need to cancel or reschedule your appointment, please contact [facility]  [phone] and follow the prompts.  Office hours are 8:00 a.m. to 4:30 p.m. Monday - Friday. Please note that voicemails left after 4:00 p.m. may not be returned until the following business day.  We are closed weekends and major holidays. You have access to a nurse at all times for urgent questions. Please call the main number to the clinic [phone] and follow the prompts.  For any non-urgent questions, you may also contact your provider using MyChart. We now offer e-Visits for anyone 18 and older to request care online for non-urgent symptoms. For details visit [email or web address].   Also download the MyChart app! Go to the app store, search "MyChart", open the app, select [facility], and log in with your MyChart username and password.  Due to Covid, a mask is required upon entering the hospital/clinic. If you do not have a mask, one will be given to you upon arrival. For doctor visits, patients may have 1 support person aged 18 or older with them. For treatment visits, patients cannot have anyone with them due to current Covid guidelines and our immunocompromised population.   Iron Sucrose Injection What is this medication? IRON SUCROSE (EYE ern SOO krose) treats low levels of iron (iron deficiency anemia) in people with kidney disease. Iron is a mineral that plays an important role in making red blood cells, which carry oxygen from your lungs to the rest of your body. This medicine may   be used for other purposes; ask your health care provider or pharmacist if you have questions. COMMON BRAND NAME(S): Venofer What should I tell my care team before I take this medication? They need to know if you have any of these conditions: Anemia not caused by low iron levels Heart disease High levels  of iron in the blood Kidney disease Liver disease An unusual or allergic reaction to iron, other medications, foods, dyes, or preservatives Pregnant or trying to get pregnant Breast-feeding How should I use this medication? This medication is for infusion into a vein. It is given in a hospital or clinic setting. Talk to your care team about the use of this medication in children. While this medication may be prescribed for children as young as 2 years for selected conditions, precautions do apply. Overdosage: If you think you have taken too much of this medicine contact a poison control center or emergency room at once. NOTE: This medicine is only for you. Do not share this medicine with others. What if I miss a dose? It is important not to miss your dose. Call your care team if you are unable to keep an appointment. What may interact with this medication? Do not take this medication with any of the following: Deferoxamine Dimercaprol Other iron products This medication may also interact with the following: Chloramphenicol Deferasirox This list may not describe all possible interactions. Give your health care provider a list of all the medicines, herbs, non-prescription drugs, or dietary supplements you use. Also tell them if you smoke, drink alcohol, or use illegal drugs. Some items may interact with your medicine. What should I watch for while using this medication? Visit your care team regularly. Tell your care team if your symptoms do not start to get better or if they get worse. You may need blood work done while you are taking this medication. You may need to follow a special diet. Talk to your care team. Foods that contain iron include: whole grains/cereals, dried fruits, beans, or peas, leafy green vegetables, and organ meats (liver, kidney). What side effects may I notice from receiving this medication? Side effects that you should report to your care team as soon as  possible: Allergic reactions-skin rash, itching, hives, swelling of the face, lips, tongue, or throat Low blood pressure-dizziness, feeling faint or lightheaded, blurry vision Shortness of breath Side effects that usually do not require medical attention (report to your care team if they continue or are bothersome): Flushing Headache Joint pain Muscle pain Nausea Pain, redness, or irritation at injection site This list may not describe all possible side effects. Call your doctor for medical advice about side effects. You may report side effects to FDA at 1-800-FDA-1088. Where should I keep my medication? This medication is given in a hospital or clinic and will not be stored at home. NOTE: This sheet is a summary. It may not cover all possible information. If you have questions about this medicine, talk to your doctor, pharmacist, or health care provider.  2022 Elsevier/Gold Standard (2020-11-26 12:52:06)  

## 2021-06-11 DIAGNOSIS — F5105 Insomnia due to other mental disorder: Secondary | ICD-10-CM | POA: Diagnosis not present

## 2021-06-11 DIAGNOSIS — F29 Unspecified psychosis not due to a substance or known physiological condition: Secondary | ICD-10-CM | POA: Diagnosis not present

## 2021-06-11 DIAGNOSIS — F39 Unspecified mood [affective] disorder: Secondary | ICD-10-CM | POA: Diagnosis not present

## 2021-06-12 ENCOUNTER — Inpatient Hospital Stay: Payer: Medicare Other

## 2021-06-12 VITALS — BP 104/68 | HR 92 | Temp 97.0°F | Resp 18

## 2021-06-12 DIAGNOSIS — M810 Age-related osteoporosis without current pathological fracture: Secondary | ICD-10-CM

## 2021-06-12 DIAGNOSIS — D509 Iron deficiency anemia, unspecified: Secondary | ICD-10-CM | POA: Diagnosis not present

## 2021-06-12 DIAGNOSIS — Z79810 Long term (current) use of selective estrogen receptor modulators (SERMs): Secondary | ICD-10-CM | POA: Diagnosis not present

## 2021-06-12 DIAGNOSIS — Z79899 Other long term (current) drug therapy: Secondary | ICD-10-CM | POA: Diagnosis not present

## 2021-06-12 DIAGNOSIS — C50412 Malignant neoplasm of upper-outer quadrant of left female breast: Secondary | ICD-10-CM | POA: Diagnosis not present

## 2021-06-12 DIAGNOSIS — Z170 Estrogen receptor positive status [ER+]: Secondary | ICD-10-CM | POA: Diagnosis not present

## 2021-06-12 MED ORDER — IRON SUCROSE 20 MG/ML IV SOLN
200.0000 mg | Freq: Once | INTRAVENOUS | Status: AC
  Administered 2021-06-12: 200 mg via INTRAVENOUS
  Filled 2021-06-12: qty 10

## 2021-06-12 MED ORDER — SODIUM CHLORIDE 0.9 % IV SOLN: 200.0000 mg | Freq: Once | INTRAVENOUS | Status: DC

## 2021-06-12 MED ORDER — SODIUM CHLORIDE 0.9 % IV SOLN
Freq: Once | INTRAVENOUS | Status: AC
Start: 2021-06-12 — End: 2021-06-12
  Filled 2021-06-12: qty 250

## 2021-06-12 NOTE — Patient Instructions (Signed)

## 2021-06-23 DIAGNOSIS — H353211 Exudative age-related macular degeneration, right eye, with active choroidal neovascularization: Secondary | ICD-10-CM | POA: Diagnosis not present

## 2021-06-25 DIAGNOSIS — Z23 Encounter for immunization: Secondary | ICD-10-CM | POA: Diagnosis not present

## 2021-06-30 DIAGNOSIS — H353221 Exudative age-related macular degeneration, left eye, with active choroidal neovascularization: Secondary | ICD-10-CM | POA: Diagnosis not present

## 2021-07-07 ENCOUNTER — Ambulatory Visit (INDEPENDENT_AMBULATORY_CARE_PROVIDER_SITE_OTHER): Payer: Medicare Other | Admitting: Podiatry

## 2021-07-07 ENCOUNTER — Other Ambulatory Visit: Payer: Self-pay

## 2021-07-07 ENCOUNTER — Encounter: Payer: Self-pay | Admitting: Podiatry

## 2021-07-07 DIAGNOSIS — E1142 Type 2 diabetes mellitus with diabetic polyneuropathy: Secondary | ICD-10-CM

## 2021-07-07 DIAGNOSIS — L97521 Non-pressure chronic ulcer of other part of left foot limited to breakdown of skin: Secondary | ICD-10-CM | POA: Diagnosis not present

## 2021-07-07 NOTE — Progress Notes (Signed)
She presents today for follow-up of her hallux left which is doing much better is just a small area of hyperkeratosis to the and.  She has an area that is concerned about on the right foot beneath the first metatarsal phalangeal joint and the tip of the second stump on the left foot.  Objective: Vital signs are stable she is alert and oriented x3 all of these areas today once debrided demonstrate superficial ulcerations but no major open wounds.  Pulses are remaining palpable and capillary fill time is immediate.  Assessment chronic ulcerations hallux left subfirst right and second toe left  Plan: Continue current dressing therapies follow-up with me in 6 weeks

## 2021-07-14 DIAGNOSIS — Z23 Encounter for immunization: Secondary | ICD-10-CM | POA: Diagnosis not present

## 2021-07-21 ENCOUNTER — Other Ambulatory Visit: Payer: Self-pay | Admitting: Internal Medicine

## 2021-07-21 DIAGNOSIS — H353211 Exudative age-related macular degeneration, right eye, with active choroidal neovascularization: Secondary | ICD-10-CM | POA: Diagnosis not present

## 2021-07-28 ENCOUNTER — Encounter: Payer: Self-pay | Admitting: Internal Medicine

## 2021-07-28 ENCOUNTER — Encounter: Payer: Self-pay | Admitting: Podiatry

## 2021-07-28 DIAGNOSIS — C50912 Malignant neoplasm of unspecified site of left female breast: Secondary | ICD-10-CM

## 2021-07-28 DIAGNOSIS — E1140 Type 2 diabetes mellitus with diabetic neuropathy, unspecified: Secondary | ICD-10-CM

## 2021-07-28 DIAGNOSIS — R443 Hallucinations, unspecified: Secondary | ICD-10-CM

## 2021-07-28 DIAGNOSIS — E785 Hyperlipidemia, unspecified: Secondary | ICD-10-CM

## 2021-07-28 DIAGNOSIS — I4821 Permanent atrial fibrillation: Secondary | ICD-10-CM

## 2021-07-28 NOTE — Telephone Encounter (Signed)
Orders placed for labs

## 2021-07-29 ENCOUNTER — Telehealth: Payer: Self-pay | Admitting: Internal Medicine

## 2021-07-29 NOTE — Telephone Encounter (Signed)
Lap orders dropped off for Dr [name] . Form is up front in Dr [employer] color folder.

## 2021-07-30 ENCOUNTER — Other Ambulatory Visit: Payer: Self-pay

## 2021-07-30 ENCOUNTER — Ambulatory Visit: Payer: Medicare Other | Admitting: Podiatry

## 2021-07-30 ENCOUNTER — Encounter: Payer: Self-pay | Admitting: Podiatry

## 2021-07-30 ENCOUNTER — Ambulatory Visit (INDEPENDENT_AMBULATORY_CARE_PROVIDER_SITE_OTHER): Payer: Medicare Other | Admitting: Podiatry

## 2021-07-30 DIAGNOSIS — E1142 Type 2 diabetes mellitus with diabetic polyneuropathy: Secondary | ICD-10-CM

## 2021-07-30 DIAGNOSIS — L97521 Non-pressure chronic ulcer of other part of left foot limited to breakdown of skin: Secondary | ICD-10-CM | POA: Diagnosis not present

## 2021-07-30 MED ORDER — AMOXICILLIN-POT CLAVULANATE 875-125 MG PO TABS: 1.0000 | ORAL_TABLET | Freq: Two times a day (BID) | ORAL | 0 refills | Status: DC

## 2021-07-30 NOTE — Telephone Encounter (Signed)
Labs received and ordered. See my chart message.

## 2021-07-30 NOTE — Progress Notes (Signed)
She presents today for follow-up of ulceration hallux and second stump left foot.  Her daughter-in-law states that they have been dressing it but she continues to wear her 3 shoes and she thinks that that is related to holding the second stump medially.  Objective: Pulses remain palpable the hallux is irritated distally and the medial aspect of the second stump does demonstrate an ulceration of her very thin skin which probes to proximal phalanx.  There is no cellulitis no purulence no malodor it appears to be clean.  Assessment ulceration hallux and second stump.  Plan: At this point I started her on Augmentin encouraged them to keep the foot dry and clean to dress it lightly with Bactroban ointment and to continue to use the Darco shoe not any type of regular shoe wear.  I will follow-up with her in 2 weeks I also prescribed Augmentin.

## 2021-07-31 IMAGING — MG MM DIGITAL SCREENING BILAT W/ TOMO AND CAD
6 of 12 series · 6 of 36 positions shown · non-contrast
Comparison: Previous exam(s).

CLINICAL DATA: Screening.

EXAM:
DIGITAL SCREENING BILATERAL MAMMOGRAM WITH TOMOSYNTHESIS AND CAD
TECHNIQUE: Bilateral screening digital craniocaudal and mediolateral oblique
mammograms were obtained. Bilateral screening digital breast
tomosynthesis was performed. The images were evaluated with
computer-aided detection.

[L MLO synth-2D (1 of 3)]
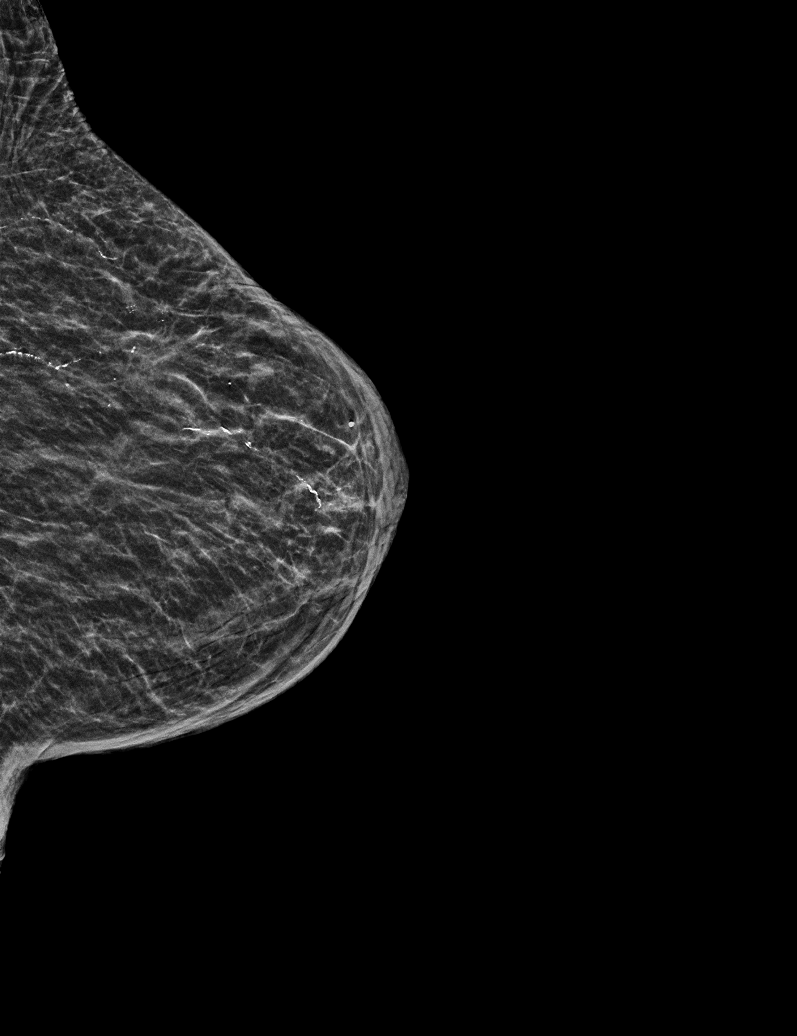

[L CC synth-2D]
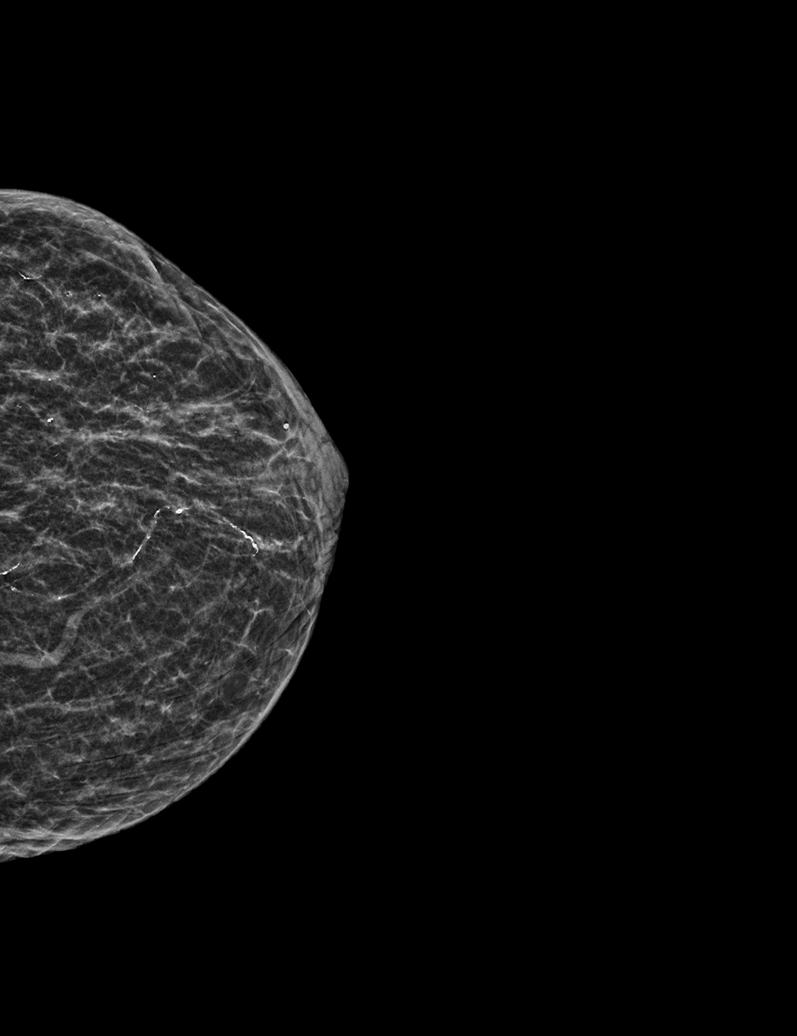

[L MLO synth-2D (2 of 3)]
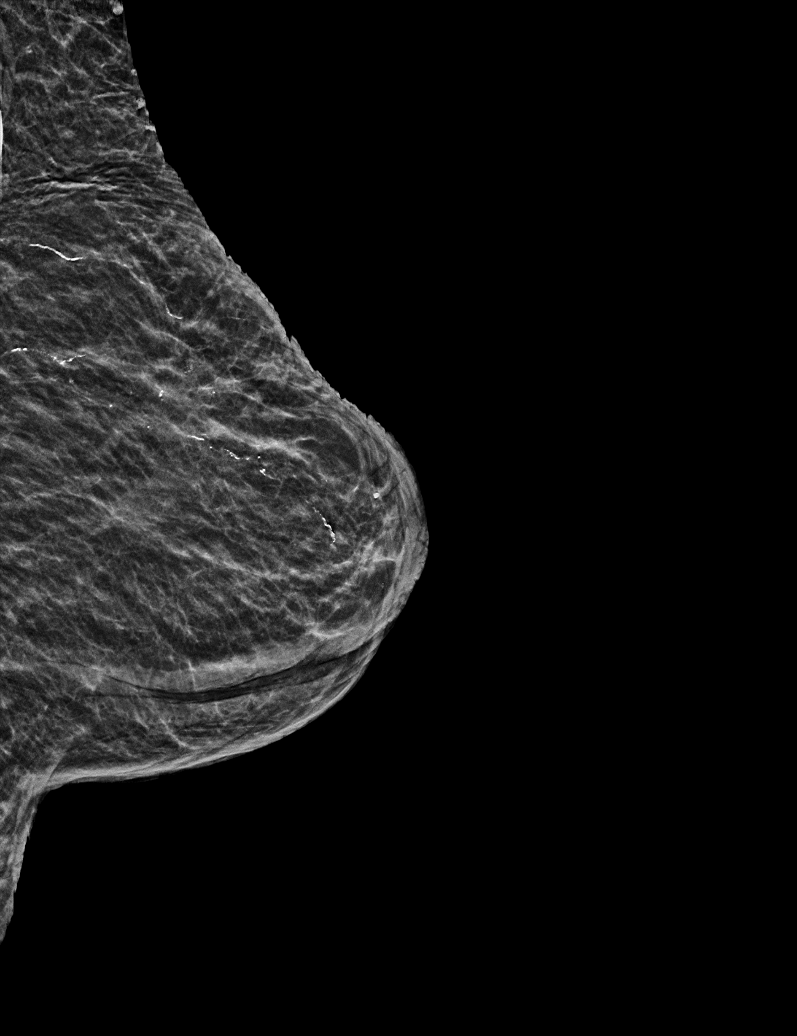

[L MLO synth-2D (3 of 3)]
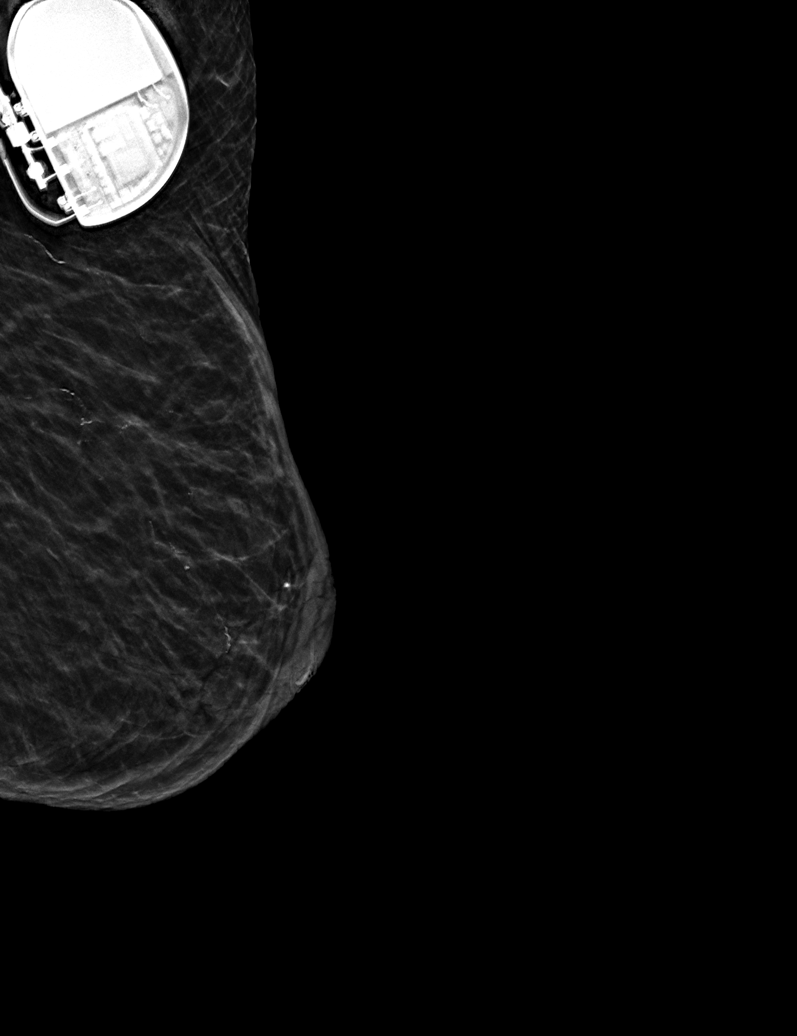

[R MLO synth-2D]
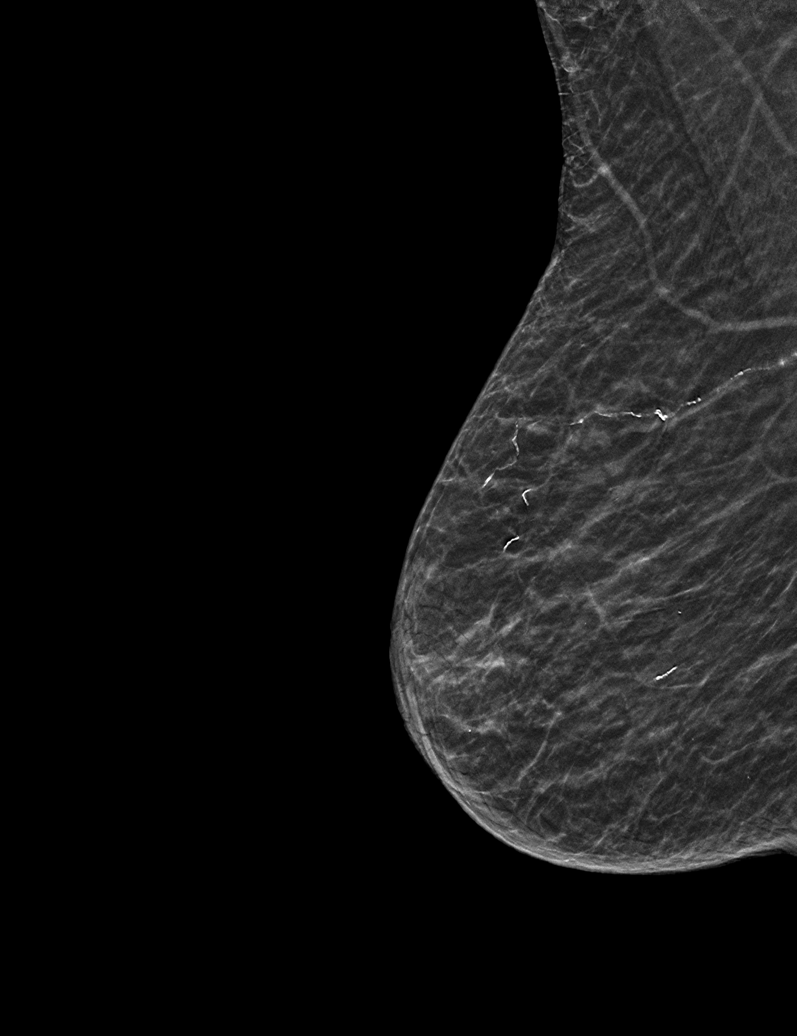

[R CC synth-2D]
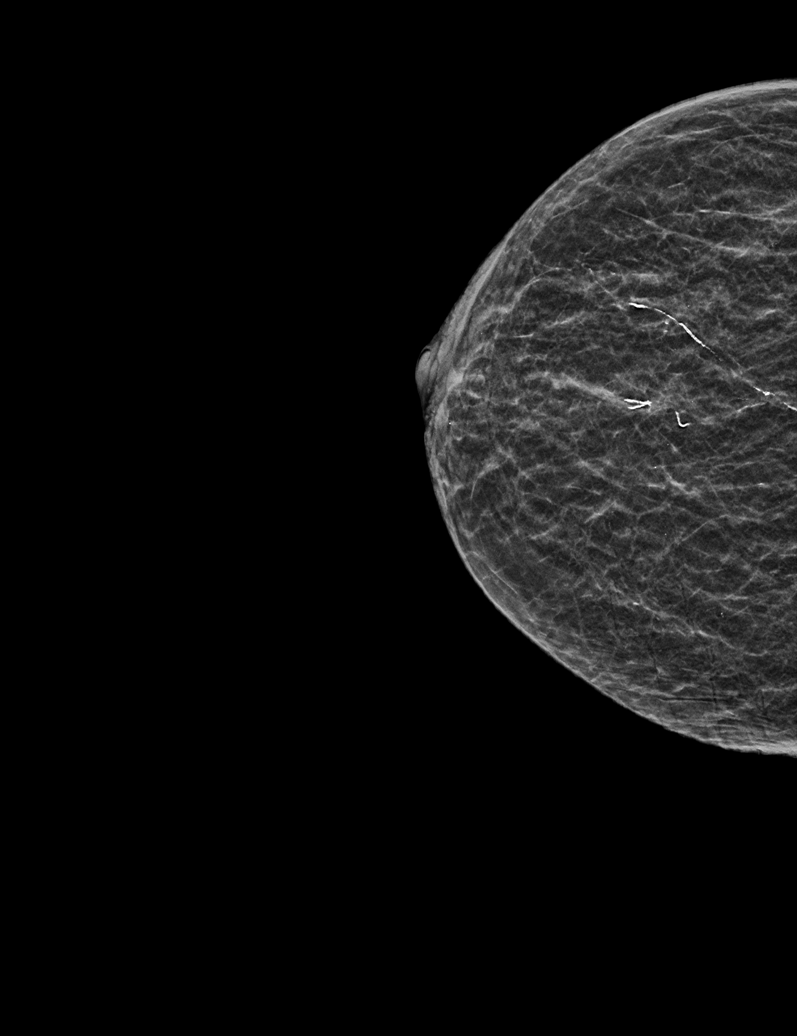

[6 of 36 positions shown; findings below may reference images not displayed]

ACR Breast Density Category b: There are scattered areas of
fibroglandular density.
FINDINGS: There are no findings suspicious for malignancy.
IMPRESSION: No mammographic evidence of malignancy. A result letter of this
screening mammogram will be mailed directly to the patient.

RECOMMENDATION:
Screening mammogram in one year. (Code:51-O-LD2)

BI-RADS CATEGORY  1: Negative.

## 2021-08-06 DIAGNOSIS — Z20828 Contact with and (suspected) exposure to other viral communicable diseases: Secondary | ICD-10-CM | POA: Diagnosis not present

## 2021-08-11 ENCOUNTER — Other Ambulatory Visit: Payer: Self-pay | Admitting: *Deleted

## 2021-08-11 DIAGNOSIS — C50412 Malignant neoplasm of upper-outer quadrant of left female breast: Secondary | ICD-10-CM

## 2021-08-11 DIAGNOSIS — M810 Age-related osteoporosis without current pathological fracture: Secondary | ICD-10-CM

## 2021-08-11 DIAGNOSIS — D509 Iron deficiency anemia, unspecified: Secondary | ICD-10-CM

## 2021-08-11 NOTE — Progress Notes (Signed)
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  INTERVAL HISTORY: Patient returns to clinic today for repeat laboratory work, further evaluation, and consideration of Venofer and Prolia.  She continues to have chronic fatigue, but otherwise feels well.  She is tolerating tamoxifen without significant side effects. She has no neurologic complaints. She denies any recent fevers or illnesses. She has a good appetite and denies weight loss.  She denies any chest pain, shortness of breath, cough, or hemoptysis.  She denies any nausea, vomiting, constipation, or diarrhea. She has no urinary complaints.  Patient offers no further specific complaints today.  REVIEW OF SYSTEMS:   Review of Systems  Constitutional:  Positive for malaise/fatigue. Negative for fever and weight loss.  Respiratory: Negative.  Negative for cough and shortness of breath.   Cardiovascular: Negative.  Negative for chest pain and leg swelling.  Gastrointestinal: Negative.  Negative for abdominal pain, blood in stool and melena.  Genitourinary: Negative.  Negative for dysuria.  Musculoskeletal: Negative.  Negative for back pain.  Skin: Negative.  Negative for rash.  Neurological: Negative.  Negative for sensory change, focal weakness, weakness and headaches.  Psychiatric/Behavioral: Negative.  The patient is not nervous/anxious.    As per HPI. Otherwise, a complete review of systems is negative.  PAST MEDICAL HISTORY: Past Medical History:  Diagnosis Date   Allergy    Anemia     Arthritis    Atrial fibrillation ([place]) 2004   Breast cancer, left ([place]) 10/2017   Lumpectomy and rad tx's.    CAD (coronary artery disease)    Complication of anesthesia    hard to wake up with general anesthesia   Diabetes ([place])    Dysrhythmia    A-fib   Heart attack ([place]) 2008   Heart murmur    History of kidney stones    History of sick sinus syndrome    s/p pacemaker placement   Hyperlipidemia    Macular degeneration    Neuropathy    Neuropathy    Pacemaker 06/2010   Personal history of radiation therapy 11/2017   LEFT lumpectomy    Squamous cell skin cancer    TIA (transient ischemic attack)     PAST SURGICAL HISTORY: Past Surgical History:  Procedure Laterality Date   ABDOMINAL HYSTERECTOMY  1960's   [place] Left 12/17/2016   SINGLE FRAGMENT OF ATYPICAL EPITHELIAL CELLS   BREAST BIOPSY Left 04/08/2017   [facility]   BREAST EXCISIONAL BIOPSY Left 01/18/1986   Benign microcalcifications, [employer].   BREAST LUMPECTOMY Left 05/26/2017   13 mm,T1c, N0; ER/ PR+, Her 2 neu negative, HIGH RISK by Mammoprint ;  Surgeon: [name], MD;  Location: [facility] ORS;  Service: General;  Laterality: Left;   [place] PROPOFOL N/A 09/12/2015   Procedure: COLONOSCOPY WITH PROPOFOL;  Surgeon: [name], MD;  Location: [facility] ENDOSCOPY;  Service: Endoscopy;  Laterality: N/A;  ERCP N/A 02/21/2021   Procedure: ENDOSCOPIC RETROGRADE CHOLANGIOPANCREATOGRAPHY (ERCP);  Surgeon: Lucilla Lame, MD;  Location: [facility] ENDOSCOPY;  Service: Endoscopy;  Laterality: N/A;   IMPLANTABLE CARDIOVERTER DEFIBRILLATOR (ICD) GENERATOR CHANGE Left 03/29/2019   Procedure: PACEMAKER CHANGE OUT;  Surgeon: [name], MD;  Location: [facility] ORS;  Service: Cardiovascular;  Laterality: Left;   KIDNEY STONE SURGERY  2018   MASS EXCISION  Left 02/24/2021   Procedure: EXCISION MASS SCALP;  Surgeon: [name], MD;  Location: [facility] ORS;  Service: General;  Laterality: Left;   MOHS SURGERY  2019   forehead   pace maker  2008   [place]   TONSILLECTOMY AND ADENOIDECTOMY  1950"s    FAMILY HISTORY: Family History  Problem Relation Age of Onset   Stroke Mother    Diabetes Mother    Cancer Maternal Aunt        Breast Cancer   Breast cancer Maternal Aunt    Arthritis Maternal Grandmother    Cancer Maternal Aunt        Lung Cancer   Diabetes Son    Cancer Son    Kidney disease Neg Hx    Bladder Cancer Neg Hx     ADVANCED DIRECTIVES (Y/N):  N  HEALTH MAINTENANCE: Social History   Tobacco Use   Smoking status: Former    Years: 5.00    Types: Cigarettes    Quit date: 09/14/1968    Years since quitting: 52.9   Smokeless tobacco: Never   Tobacco comments:    quit 1970  Vaping Use   Vaping Use: Never used  Substance Use Topics   Alcohol use: No    Alcohol/week: 0.0 standard drinks   Drug use: No     Colonoscopy:  PAP:  Bone density:  Lipid panel:  Allergies  Allergen Reactions   Lyrica [Pregabalin] Swelling    Sleepy, does not eat   Neurontin [Gabapentin] Nausea And Vomiting and Other (See Comments)    disoriented   Bactrim [Sulfamethoxazole-Trimethoprim]     Made her dizziness    Current Outpatient Medications  Medication Sig Dispense Refill   acetaminophen (TYLENOL) 325 MG tablet Take 2 tablets (650 mg total) by mouth every 6 (six) hours as needed for mild pain (or Fever >/= 101).     Aflibercept 2 MG/0.05ML SOLN 1 each by Intravitreal route every 8 (eight) weeks.     amoxicillin-clavulanate (AUGMENTIN) 875-125 MG tablet Take 1 tablet by mouth 2 (two) times daily. 20 tablet 0   apixaban (ELIQUIS) 2.5 MG TABS tablet Take 1 tablet (2.5 mg total) by mouth 2 (two) times daily. 60 tablet 1   atorvastatin (LIPITOR) 40 MG tablet Take 1 tablet (40 mg total) by mouth daily. 30 tablet 0    atorvastatin (LIPITOR) 40 MG tablet Take 1 tablet by mouth daily.     Biotin w/ Vitamins C & E (HAIR/SKIN/NAILS PO) Take 3 tablets by mouth daily. 2 in the am, 1 in the pm     blood glucose meter kit and supplies Per patient please dispense One touch Ultra2 meter.Use meter and supplies three times a day to check blood sugar. ICD10: E11.40 1 each 0   Blood Glucose Monitoring Suppl (FREESTYLE LITE) DEVI 2 (two) times daily.     Cholecalciferol (VITAMIN D) 50 MCG (2000 UT) CAPS Take 4,000 Units by mouth daily.     denosumab (PROLIA) 60 MG/ML SOSY injection Inject 60 mg into the skin every 6 (six) months.  divalproex (DEPAKOTE ER) 500 MG 24 hr tablet Take 500 mg by mouth daily.     furosemide (LASIX) 20 MG tablet Take 1 tablet (20 mg total) by mouth daily.     glucose blood (FREESTYLE LITE) test strip Used to check blood sugars twice a day. DX E11.9 100 each 12   isosorbide mononitrate (IMDUR) 30 MG 24 hr tablet Take 1 tablet (30 mg total) by mouth daily. 30 tablet 0   Lancets (FREESTYLE) lancets 2 (two) times daily.     Magnesium 200 MG TABS Take 200 mg by mouth 2 (two) times a day.     meclizine (ANTIVERT) 12.5 MG tablet Take 1 tablet (12.5 mg total) by mouth 2 (two) times daily as needed for dizziness. 14 tablet 0   metFORMIN (GLUCOPHAGE-XR) 500 MG 24 hr tablet Take 1 tablet by mouth twice daily 90 tablet 0   metoprolol tartrate (LOPRESSOR) 25 MG tablet Take 25 mg by mouth 2 (two) times daily.     mirtazapine (REMERON) 15 MG tablet Take 15 mg by mouth at bedtime.     Multiple Vitamins-Minerals (PRESERVISION AREDS 2 PO) Take 1 tablet by mouth 2 (two) times daily.      mupirocin ointment (BACTROBAN) 2 % Apply 1 application topically 2 (two) times daily. 22 g 0   Polyethyl Glycol-Propyl Glycol (SYSTANE OP) Apply 1 drop to eye 2 (two) times daily.     Probiotic Product (PROBIOTIC DAILY PO) Take by mouth.     tamoxifen (NOLVADEX) 20 MG tablet Take 1 tablet by mouth once daily 90 tablet 3    vitamin B-12 (CYANOCOBALAMIN) 500 MCG tablet Take 500 mcg by mouth 2 (two) times a day.     No current facility-administered medications for this visit.    OBJECTIVE: Vitals:   08/12/21 1311  BP: (!) 89/50  Pulse: 66  Resp: 18  Temp: (!) 97.2 F (36.2 C)  SpO2: 99%     There is no height or weight on file to calculate BMI.    ECOG FS:1 - Symptomatic but completely ambulatory  General: Well-developed, well-nourished, no acute distress.  Sitting in a wheelchair. Eyes: Pink conjunctiva, anicteric sclera. HEENT: Normocephalic, moist mucous membranes. Breast: Exam deferred today. Lungs: No audible wheezing or coughing. Heart: Regular rate and rhythm. Abdomen: Soft, nontender, no obvious distention. Musculoskeletal: No edema, cyanosis, or clubbing. Neuro: Alert, answering all questions appropriately. Cranial nerves grossly intact. Skin: No rashes or petechiae noted. Psych: Normal affect.   LAB RESULTS:  Lab Results  Component Value Date   NA 134 (L) 08/12/2021   K 4.0 08/12/2021   CL 97 (L) 08/12/2021   CO2 27 08/12/2021   GLUCOSE 155 (H) 08/12/2021   BUN 23 08/12/2021   CREATININE 0.72 08/12/2021   CALCIUM 9.8 08/12/2021   PROT 7.2 08/12/2021   ALBUMIN 3.1 (L) 08/12/2021   AST 28 08/12/2021   ALT 17 08/12/2021   ALKPHOS 34 (L) 08/12/2021   BILITOT 0.4 08/12/2021   GFRNONAA >60 08/12/2021   GFRAA >60 04/17/2020    Lab Results  Component Value Date   WBC 4.1 08/12/2021   NEUTROABS 2.5 08/12/2021   HGB 10.2 (L) 08/12/2021   HCT 31.7 (L) 08/12/2021   MCV 96.1 08/12/2021   PLT 130 (L) 08/12/2021   Lab Results  Component Value Date   IRON 52 08/12/2021   TIBC 251 08/12/2021   IRONPCTSAT 21 08/12/2021   Lab Results  Component Value Date   FERRITIN 60 08/12/2021  STUDIES: No results found.   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  PLAN:    1.  Pathologic  stage Ia ER/PR positive, HER-2 negative invasive carcinoma of the upper outer quadrant of the left breast: Although patient had high risk Mammaprint, it was previously decided by the patient that given her advanced age that pursuing chemotherapy may be more detrimental than beneficial.  She completed adjuvant XRT. Given patient's osteoporosis, she was placed on tamoxifen which she will continue for a minimum of 5 years completing in 2024.  Her most recent mammogram on April 03, 2021 was reported as BI-RADS 2.  Repeat in July 2023.   2.  Osteoporosis: Patient's most recent bone mineral density on December 05, 2020 was significantly worse with a T score of -3.4.  Fosamax was discontinued and patient was initiated on Prolia in May 2022.  Tamoxifen as above.  Proceed with Prolia today.  Continue calcium and vitamin D supplementation.  Repeat bone mineral density in March 2023 with follow-up several days later.   3.  Iron deficiency anemia: Patient's hemoglobin remains decreased, but significantly improved to 10.2.  Her iron stores are within normal limits today.  She does not require additional IV Venofer.  Patient last received treatment on June 12, 2021.  Return to clinic in 4 months after her bone mineral density for repeat laboratory work, further evaluation, and consideration of treatment if needed. 4.  Thrombocytopenia: Mild, monitor.  Patient expressed understanding and was in agreement with this plan. She also understands that She can call clinic at any time with any questions, concerns, or complaints.    Cancer Staging  Breast cancer, left [facility]) Staging form: Breast, AJCC 8th Edition - Pathologic stage from 06/27/2017: Stage IA (pT1c, pN0, cM0, G1, ER+, PR+, HER2-) - Signed by [name], MD on 06/27/2017 Neoadjuvant therapy: No Multigene prognostic tests performed: MammaPrint Histologic grading system: 3 grade system Laterality: Left   [name], MD   08/13/2021 5:08  AM

## 2021-08-12 ENCOUNTER — Inpatient Hospital Stay (HOSPITAL_BASED_OUTPATIENT_CLINIC_OR_DEPARTMENT_OTHER): Payer: Medicare Other | Admitting: Oncology

## 2021-08-12 ENCOUNTER — Other Ambulatory Visit: Payer: Self-pay

## 2021-08-12 ENCOUNTER — Inpatient Hospital Stay: Payer: Medicare Other | Attending: Oncology

## 2021-08-12 ENCOUNTER — Inpatient Hospital Stay: Payer: Medicare Other

## 2021-08-12 VITALS — BP 89/50 | HR 66 | Temp 97.2°F | Resp 18

## 2021-08-12 DIAGNOSIS — E785 Hyperlipidemia, unspecified: Secondary | ICD-10-CM | POA: Insufficient documentation

## 2021-08-12 DIAGNOSIS — Z79810 Long term (current) use of selective estrogen receptor modulators (SERMs): Secondary | ICD-10-CM | POA: Insufficient documentation

## 2021-08-12 DIAGNOSIS — Z170 Estrogen receptor positive status [ER+]: Secondary | ICD-10-CM | POA: Insufficient documentation

## 2021-08-12 DIAGNOSIS — G629 Polyneuropathy, unspecified: Secondary | ICD-10-CM | POA: Diagnosis not present

## 2021-08-12 DIAGNOSIS — I252 Old myocardial infarction: Secondary | ICD-10-CM | POA: Diagnosis not present

## 2021-08-12 DIAGNOSIS — D509 Iron deficiency anemia, unspecified: Secondary | ICD-10-CM

## 2021-08-12 DIAGNOSIS — Z923 Personal history of irradiation: Secondary | ICD-10-CM | POA: Diagnosis not present

## 2021-08-12 DIAGNOSIS — M810 Age-related osteoporosis without current pathological fracture: Secondary | ICD-10-CM

## 2021-08-12 DIAGNOSIS — E119 Type 2 diabetes mellitus without complications: Secondary | ICD-10-CM | POA: Insufficient documentation

## 2021-08-12 DIAGNOSIS — Z803 Family history of malignant neoplasm of breast: Secondary | ICD-10-CM | POA: Insufficient documentation

## 2021-08-12 DIAGNOSIS — Z801 Family history of malignant neoplasm of trachea, bronchus and lung: Secondary | ICD-10-CM | POA: Insufficient documentation

## 2021-08-12 DIAGNOSIS — C50412 Malignant neoplasm of upper-outer quadrant of left female breast: Secondary | ICD-10-CM | POA: Insufficient documentation

## 2021-08-12 DIAGNOSIS — I2510 Atherosclerotic heart disease of native coronary artery without angina pectoris: Secondary | ICD-10-CM | POA: Insufficient documentation

## 2021-08-12 DIAGNOSIS — I4891 Unspecified atrial fibrillation: Secondary | ICD-10-CM | POA: Insufficient documentation

## 2021-08-12 DIAGNOSIS — Z87442 Personal history of urinary calculi: Secondary | ICD-10-CM | POA: Insufficient documentation

## 2021-08-12 DIAGNOSIS — Z8673 Personal history of transient ischemic attack (TIA), and cerebral infarction without residual deficits: Secondary | ICD-10-CM | POA: Diagnosis not present

## 2021-08-12 LAB — CBC WITH DIFFERENTIAL/PLATELET
Abs Immature Granulocytes: 0.01 10*3/uL (ref 0.00–0.07)
Basophils Absolute: 0 10*3/uL (ref 0.0–0.1)
Basophils Relative: 0 %
Eosinophils Absolute: 0 10*3/uL (ref 0.0–0.5)
Eosinophils Relative: 1 %
HCT: 31.7 % — ABNORMAL LOW (ref 36.0–46.0)
Hemoglobin: 10.2 g/dL — ABNORMAL LOW (ref 12.0–15.0)
Immature Granulocytes: 0 %
Lymphocytes Relative: 31 %
Lymphs Abs: 1.3 10*3/uL (ref 0.7–4.0)
MCH: 30.9 pg (ref 26.0–34.0)
MCHC: 32.2 g/dL (ref 30.0–36.0)
MCV: 96.1 fL (ref 80.0–100.0)
Monocytes Absolute: 0.3 10*3/uL (ref 0.1–1.0)
Monocytes Relative: 7 %
Neutro Abs: 2.5 10*3/uL (ref 1.7–7.7)
Neutrophils Relative %: 61 %
Platelets: 130 10*3/uL — ABNORMAL LOW (ref 150–400)
RBC: 3.3 MIL/uL — ABNORMAL LOW (ref 3.87–5.11)
RDW: 16.3 % — ABNORMAL HIGH (ref 11.5–15.5)
WBC: 4.1 10*3/uL (ref 4.0–10.5)
nRBC: 0 % (ref 0.0–0.2)

## 2021-08-12 LAB — FERRITIN: Ferritin: 60 ng/mL (ref 11–307)

## 2021-08-12 LAB — COMPREHENSIVE METABOLIC PANEL
ALT: 17 U/L (ref 0–44)
AST: 28 U/L (ref 15–41)
Albumin: 3.1 g/dL — ABNORMAL LOW (ref 3.5–5.0)
Alkaline Phosphatase: 34 U/L — ABNORMAL LOW (ref 38–126)
Anion gap: 10 (ref 5–15)
BUN: 23 mg/dL (ref 8–23)
CO2: 27 mmol/L (ref 22–32)
Calcium: 9.8 mg/dL (ref 8.9–10.3)
Chloride: 97 mmol/L — ABNORMAL LOW (ref 98–111)
Creatinine, Ser: 0.72 mg/dL (ref 0.44–1.00)
GFR, Estimated: >60 mL/min (ref >60)
Glucose, Bld: 155 mg/dL — ABNORMAL HIGH (ref 70–99)
Potassium: 4 mmol/L (ref 3.5–5.1)
Sodium: 134 mmol/L — ABNORMAL LOW (ref 135–145)
Total Bilirubin: 0.4 mg/dL (ref 0.3–1.2)
Total Protein: 7.2 g/dL (ref 6.5–8.1)

## 2021-08-12 LAB — IRON AND TIBC
Iron: 52 ug/dL (ref 28–170)
Saturation Ratios: 21 % (ref 10.4–31.8)
TIBC: 251 ug/dL (ref 250–450)
UIBC: 199 ug/dL

## 2021-08-12 MED ORDER — DENOSUMAB 60 MG/ML ~~LOC~~ SOSY
60.0000 mg | PREFILLED_SYRINGE | Freq: Once | SUBCUTANEOUS | Status: AC
  Administered 2021-08-12: 60 mg via SUBCUTANEOUS

## 2021-08-12 NOTE — Progress Notes (Signed)
Pt and family have no concerns at this time. Pt does have 2 wounds on left foot being treated. Just completeed course of abx and currently using antibacterial ointment on the wounds. Pt family is closely monitoring healing process.

## 2021-08-13 ENCOUNTER — Other Ambulatory Visit: Payer: Self-pay | Admitting: Podiatry

## 2021-08-13 ENCOUNTER — Encounter: Payer: Self-pay | Admitting: Podiatry

## 2021-08-13 ENCOUNTER — Ambulatory Visit (INDEPENDENT_AMBULATORY_CARE_PROVIDER_SITE_OTHER): Payer: Medicare Other | Admitting: Podiatry

## 2021-08-13 ENCOUNTER — Telehealth: Payer: Self-pay | Admitting: Oncology

## 2021-08-13 ENCOUNTER — Encounter: Payer: Self-pay | Admitting: Oncology

## 2021-08-13 DIAGNOSIS — L97521 Non-pressure chronic ulcer of other part of left foot limited to breakdown of skin: Secondary | ICD-10-CM

## 2021-08-13 DIAGNOSIS — E1142 Type 2 diabetes mellitus with diabetic polyneuropathy: Secondary | ICD-10-CM

## 2021-08-13 DIAGNOSIS — D689 Coagulation defect, unspecified: Secondary | ICD-10-CM

## 2021-08-13 NOTE — Progress Notes (Signed)
She presents today for follow-up of her ulceration second toe stump and the hallux right fo left foot.  Seems to be doing much better her daughter-in-law says.  Objective: Vital signs are stable she alert oriented x3 there is no erythema edema cellulitis drainage or odor granulation tissue to each wound site.  Area was debrided today no excessive bleeding.  Assessment: Well-healing ulcerative foot.  Plan: I redressed today dressed a compressive dressing still attempting to get home health to assist in dressing and we are also going to follow-up with her and her husband in 1 week for routine nail debridement.

## 2021-08-13 NOTE — Telephone Encounter (Signed)
Spoke with [name] (patient's relative) about the addition of lab work in MARCH 2023 and the change in appointment with Dr. Grayland Ormond. She stated that those changes are ok and that she would see all of them updated in MyChart.

## 2021-08-15 DIAGNOSIS — Z89422 Acquired absence of other left toe(s): Secondary | ICD-10-CM | POA: Diagnosis not present

## 2021-08-15 DIAGNOSIS — D2371 Other benign neoplasm of skin of right lower limb, including hip: Secondary | ICD-10-CM | POA: Diagnosis not present

## 2021-08-15 DIAGNOSIS — B351 Tinea unguium: Secondary | ICD-10-CM | POA: Diagnosis not present

## 2021-08-15 DIAGNOSIS — D2372 Other benign neoplasm of skin of left lower limb, including hip: Secondary | ICD-10-CM | POA: Diagnosis not present

## 2021-08-15 DIAGNOSIS — Z9842 Cataract extraction status, left eye: Secondary | ICD-10-CM | POA: Diagnosis not present

## 2021-08-15 DIAGNOSIS — Z95810 Presence of automatic (implantable) cardiac defibrillator: Secondary | ICD-10-CM | POA: Diagnosis not present

## 2021-08-15 DIAGNOSIS — Z9841 Cataract extraction status, right eye: Secondary | ICD-10-CM | POA: Diagnosis not present

## 2021-08-15 DIAGNOSIS — M1990 Unspecified osteoarthritis, unspecified site: Secondary | ICD-10-CM | POA: Diagnosis not present

## 2021-08-15 DIAGNOSIS — I2510 Atherosclerotic heart disease of native coronary artery without angina pectoris: Secondary | ICD-10-CM | POA: Diagnosis not present

## 2021-08-15 DIAGNOSIS — I252 Old myocardial infarction: Secondary | ICD-10-CM | POA: Diagnosis not present

## 2021-08-15 DIAGNOSIS — Z7984 Long term (current) use of oral hypoglycemic drugs: Secondary | ICD-10-CM | POA: Diagnosis not present

## 2021-08-15 DIAGNOSIS — C50412 Malignant neoplasm of upper-outer quadrant of left female breast: Secondary | ICD-10-CM | POA: Diagnosis not present

## 2021-08-15 DIAGNOSIS — M810 Age-related osteoporosis without current pathological fracture: Secondary | ICD-10-CM | POA: Diagnosis not present

## 2021-08-15 DIAGNOSIS — Z7901 Long term (current) use of anticoagulants: Secondary | ICD-10-CM | POA: Diagnosis not present

## 2021-08-15 DIAGNOSIS — D509 Iron deficiency anemia, unspecified: Secondary | ICD-10-CM | POA: Diagnosis not present

## 2021-08-15 DIAGNOSIS — I495 Sick sinus syndrome: Secondary | ICD-10-CM | POA: Diagnosis not present

## 2021-08-15 DIAGNOSIS — E785 Hyperlipidemia, unspecified: Secondary | ICD-10-CM | POA: Diagnosis not present

## 2021-08-15 DIAGNOSIS — L97522 Non-pressure chronic ulcer of other part of left foot with fat layer exposed: Secondary | ICD-10-CM | POA: Diagnosis not present

## 2021-08-15 DIAGNOSIS — Z170 Estrogen receptor positive status [ER+]: Secondary | ICD-10-CM | POA: Diagnosis not present

## 2021-08-15 DIAGNOSIS — Z950 Presence of cardiac pacemaker: Secondary | ICD-10-CM | POA: Diagnosis not present

## 2021-08-15 DIAGNOSIS — I4891 Unspecified atrial fibrillation: Secondary | ICD-10-CM | POA: Diagnosis not present

## 2021-08-15 DIAGNOSIS — D689 Coagulation defect, unspecified: Secondary | ICD-10-CM | POA: Diagnosis not present

## 2021-08-15 DIAGNOSIS — H3530 Unspecified macular degeneration: Secondary | ICD-10-CM | POA: Diagnosis not present

## 2021-08-15 DIAGNOSIS — E11621 Type 2 diabetes mellitus with foot ulcer: Secondary | ICD-10-CM | POA: Diagnosis not present

## 2021-08-15 DIAGNOSIS — E1142 Type 2 diabetes mellitus with diabetic polyneuropathy: Secondary | ICD-10-CM | POA: Diagnosis not present

## 2021-08-17 ENCOUNTER — Telehealth: Payer: Self-pay | Admitting: Internal Medicine

## 2021-08-17 ENCOUNTER — Encounter: Payer: Self-pay | Admitting: Internal Medicine

## 2021-08-17 NOTE — Telephone Encounter (Signed)
Opened in error

## 2021-08-18 DIAGNOSIS — H353221 Exudative age-related macular degeneration, left eye, with active choroidal neovascularization: Secondary | ICD-10-CM | POA: Diagnosis not present

## 2021-08-20 ENCOUNTER — Ambulatory Visit (INDEPENDENT_AMBULATORY_CARE_PROVIDER_SITE_OTHER): Payer: Medicare Other | Admitting: Podiatry

## 2021-08-20 ENCOUNTER — Telehealth: Payer: Self-pay | Admitting: *Deleted

## 2021-08-20 ENCOUNTER — Other Ambulatory Visit: Payer: Self-pay

## 2021-08-20 ENCOUNTER — Ambulatory Visit: Payer: Medicare Other | Admitting: Podiatry

## 2021-08-20 DIAGNOSIS — L97524 Non-pressure chronic ulcer of other part of left foot with necrosis of bone: Secondary | ICD-10-CM

## 2021-08-20 DIAGNOSIS — M79609 Pain in unspecified limb: Secondary | ICD-10-CM | POA: Diagnosis not present

## 2021-08-20 DIAGNOSIS — L97522 Non-pressure chronic ulcer of other part of left foot with fat layer exposed: Secondary | ICD-10-CM

## 2021-08-20 DIAGNOSIS — B351 Tinea unguium: Secondary | ICD-10-CM | POA: Diagnosis not present

## 2021-08-20 DIAGNOSIS — I25119 Atherosclerotic heart disease of native coronary artery with unspecified angina pectoris: Secondary | ICD-10-CM

## 2021-08-20 MED ORDER — AMOXICILLIN-POT CLAVULANATE 875-125 MG PO TABS: 1.0000 | ORAL_TABLET | Freq: Two times a day (BID) | ORAL | 0 refills | Status: DC

## 2021-08-20 NOTE — Telephone Encounter (Signed)
Cheryll Dessert called (patient's daughter) LM on VM - requesting new wound care order to be faxed to [facility] for dressing changes every day instead of every other day since bone is now exposed.

## 2021-08-21 ENCOUNTER — Encounter: Payer: Self-pay | Admitting: Podiatry

## 2021-08-21 ENCOUNTER — Encounter: Payer: Self-pay | Admitting: Internal Medicine

## 2021-08-21 NOTE — Progress Notes (Signed)
  Subjective:  Patient ID: Holly Rose, female    DOB: Aug 16, 1930,  MRN: [id number]  Chief Complaint  Patient presents with   Diabetic Ulcer     diabetic foot care / OKAY PER DR. HYATT    87 y.o. female presents with the above complaint. History confirmed with patient.  Here today for her at risk diabetic foot care visit as well as ongoing wound care of the left foot  Objective:  Physical Exam: Both feet are warm and well perfused and she has weakly palpable pulses, thickened elongated nails x8 with Yellow discolored nails with subungual debris, left hallux and amputation site of second toe have a full-thickness ulceration.  The second toe ulceration extends directly to bone.   Assessment:   1. Skin ulcer of left great toe with fat layer exposed ([place])   2. 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  Unfortunate her ulcerations continue to worsen.  This very difficult to manage due to her multiple comorbidities of that I think preclude her from having a reasonable outcome with wound care and as well as surgical intervention for this.  Discussed with her daughter we could consider partial bony debridement and excision of the wound under local anesthesia only.  They will consider this.  In the meantime I will put her back on Augmentin.  Alternatively may need to keep her on suppressive antibiotics for quite some period time which is also risky in a patient her age.  I will discuss her case with [name].  We also placed an order for home wound care and fax this to the agency.  Discussed the etiology and treatment options for the condition in detail with the patient. Educated patient on the topical and oral treatment options for mycotic nails. Recommended debridement of the nails today. Sharp and mechanical debridement performed of all painful and mycotic nails today. Nails debrided in  length and thickness using a nail nipper to level of comfort. Discussed treatment options including appropriate shoe gear. Follow up as needed for painful nails.    Return in about 1 week (around 08/27/2021) for with Dr Milinda Pointer.

## 2021-08-22 DIAGNOSIS — E1142 Type 2 diabetes mellitus with diabetic polyneuropathy: Secondary | ICD-10-CM | POA: Diagnosis not present

## 2021-08-22 DIAGNOSIS — M810 Age-related osteoporosis without current pathological fracture: Secondary | ICD-10-CM | POA: Diagnosis not present

## 2021-08-22 DIAGNOSIS — L97522 Non-pressure chronic ulcer of other part of left foot with fat layer exposed: Secondary | ICD-10-CM | POA: Diagnosis not present

## 2021-08-22 DIAGNOSIS — C50412 Malignant neoplasm of upper-outer quadrant of left female breast: Secondary | ICD-10-CM | POA: Diagnosis not present

## 2021-08-22 DIAGNOSIS — D509 Iron deficiency anemia, unspecified: Secondary | ICD-10-CM | POA: Diagnosis not present

## 2021-08-22 DIAGNOSIS — E11621 Type 2 diabetes mellitus with foot ulcer: Secondary | ICD-10-CM | POA: Diagnosis not present

## 2021-08-25 DIAGNOSIS — H353211 Exudative age-related macular degeneration, right eye, with active choroidal neovascularization: Secondary | ICD-10-CM | POA: Diagnosis not present

## 2021-08-26 NOTE — Telephone Encounter (Signed)
Orders have been faxed

## 2021-08-27 ENCOUNTER — Encounter: Payer: Self-pay | Admitting: Podiatry

## 2021-08-27 ENCOUNTER — Other Ambulatory Visit: Payer: Self-pay

## 2021-08-27 ENCOUNTER — Ambulatory Visit (INDEPENDENT_AMBULATORY_CARE_PROVIDER_SITE_OTHER): Payer: Medicare Other | Admitting: Podiatry

## 2021-08-27 DIAGNOSIS — E1142 Type 2 diabetes mellitus with diabetic polyneuropathy: Secondary | ICD-10-CM | POA: Diagnosis not present

## 2021-08-27 DIAGNOSIS — L97524 Non-pressure chronic ulcer of other part of left foot with necrosis of bone: Secondary | ICD-10-CM | POA: Diagnosis not present

## 2021-08-27 DIAGNOSIS — L97522 Non-pressure chronic ulcer of other part of left foot with fat layer exposed: Secondary | ICD-10-CM

## 2021-08-27 MED ORDER — AMOXICILLIN-POT CLAVULANATE 875-125 MG PO TABS: 1.0000 | ORAL_TABLET | Freq: Two times a day (BID) | ORAL | 0 refills | Status: DC

## 2021-08-27 NOTE — Progress Notes (Signed)
She presents today with her daughter-in-law for follow-up for ulceration first and second toes of the left foot.  The hallux left has gone on to heal very good the daughter-in-law says however she still getting some drainage out of the second amputated stump site.  Objective: Vital signs are stable she is alert and oriented x3.  She actually has good strong palpable pulses to the left foot the hallux appears to be healing very nicely however she still has a superficial ulceration that does in 1 spot probe deep to bone with some yellow serosanguineous type drainage.  There is no purulence no malodor associated with it.  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  Plan: We will continue her on antibiotics to see if we can get that to subside and continue to dress daily with Iodosorb.  She will dress them either daily or every other day.  She will continue the use of her Darco shoe.  We refilled her Augmentin and we will keep her on this for another 2 weeks should this not alleviate her ulcerative lesion then we will consider and ostectomy.

## 2021-08-29 DIAGNOSIS — D509 Iron deficiency anemia, unspecified: Secondary | ICD-10-CM | POA: Diagnosis not present

## 2021-08-29 DIAGNOSIS — C50412 Malignant neoplasm of upper-outer quadrant of left female breast: Secondary | ICD-10-CM | POA: Diagnosis not present

## 2021-08-29 DIAGNOSIS — L97522 Non-pressure chronic ulcer of other part of left foot with fat layer exposed: Secondary | ICD-10-CM | POA: Diagnosis not present

## 2021-08-29 DIAGNOSIS — E11621 Type 2 diabetes mellitus with foot ulcer: Secondary | ICD-10-CM | POA: Diagnosis not present

## 2021-08-29 DIAGNOSIS — M810 Age-related osteoporosis without current pathological fracture: Secondary | ICD-10-CM | POA: Diagnosis not present

## 2021-08-29 DIAGNOSIS — E1142 Type 2 diabetes mellitus with diabetic polyneuropathy: Secondary | ICD-10-CM | POA: Diagnosis not present

## 2021-09-01 ENCOUNTER — Other Ambulatory Visit: Payer: Self-pay

## 2021-09-01 ENCOUNTER — Encounter: Payer: Self-pay | Admitting: Internal Medicine

## 2021-09-01 ENCOUNTER — Other Ambulatory Visit (INDEPENDENT_AMBULATORY_CARE_PROVIDER_SITE_OTHER): Payer: Medicare Other

## 2021-09-01 DIAGNOSIS — E785 Hyperlipidemia, unspecified: Secondary | ICD-10-CM

## 2021-09-01 DIAGNOSIS — E1140 Type 2 diabetes mellitus with diabetic neuropathy, unspecified: Secondary | ICD-10-CM

## 2021-09-01 DIAGNOSIS — R443 Hallucinations, unspecified: Secondary | ICD-10-CM | POA: Diagnosis not present

## 2021-09-01 LAB — HEPATIC FUNCTION PANEL
ALT: 14 U/L (ref 0–35)
AST: 20 U/L (ref 0–37)
Albumin: 3.2 g/dL — ABNORMAL LOW (ref 3.5–5.2)
Alkaline Phosphatase: 32 U/L — ABNORMAL LOW (ref 39–117)
Bilirubin, Direct: 0.1 mg/dL (ref 0.0–0.3)
Total Bilirubin: 0.4 mg/dL (ref 0.2–1.2)
Total Protein: 7 g/dL (ref 6.0–8.3)

## 2021-09-01 LAB — BASIC METABOLIC PANEL
BUN: 23 mg/dL (ref 6–23)
CO2: 26 mEq/L (ref 19–32)
Calcium: 9.8 mg/dL (ref 8.4–10.5)
Chloride: 102 mEq/L (ref 96–112)
Creatinine, Ser: 0.66 mg/dL (ref 0.40–1.20)
GFR: 77.57 mL/min (ref >60.00)
Glucose, Bld: 111 mg/dL — ABNORMAL HIGH (ref 70–99)
Potassium: 4.1 mEq/L (ref 3.5–5.1)
Sodium: 137 mEq/L (ref 135–145)

## 2021-09-01 LAB — LIPID PANEL
Cholesterol: 119 mg/dL (ref 0–200)
HDL: 53 mg/dL (ref >39.00)
LDL Cholesterol: 55 mg/dL (ref 0–99)
NonHDL: 65.62
Total CHOL/HDL Ratio: 2
Triglycerides: 55 mg/dL (ref 0.0–149.0)
VLDL: 11 mg/dL (ref 0.0–40.0)

## 2021-09-01 LAB — TSH: TSH: 5.64 u[IU]/mL — ABNORMAL HIGH (ref 0.35–5.50)

## 2021-09-01 LAB — HEMOGLOBIN A1C: Hgb A1c MFr Bld: 6.2 % (ref 4.6–6.5)

## 2021-09-02 ENCOUNTER — Encounter: Payer: Self-pay | Admitting: Internal Medicine

## 2021-09-02 ENCOUNTER — Ambulatory Visit (INDEPENDENT_AMBULATORY_CARE_PROVIDER_SITE_OTHER): Payer: Medicare Other | Admitting: Internal Medicine

## 2021-09-02 ENCOUNTER — Other Ambulatory Visit: Payer: Self-pay

## 2021-09-02 DIAGNOSIS — E785 Hyperlipidemia, unspecified: Secondary | ICD-10-CM

## 2021-09-02 DIAGNOSIS — E559 Vitamin D deficiency, unspecified: Secondary | ICD-10-CM | POA: Diagnosis not present

## 2021-09-02 DIAGNOSIS — L97529 Non-pressure chronic ulcer of other part of left foot with unspecified severity: Secondary | ICD-10-CM

## 2021-09-02 DIAGNOSIS — I4821 Permanent atrial fibrillation: Secondary | ICD-10-CM | POA: Diagnosis not present

## 2021-09-02 DIAGNOSIS — C50912 Malignant neoplasm of unspecified site of left female breast: Secondary | ICD-10-CM

## 2021-09-02 DIAGNOSIS — J439 Emphysema, unspecified: Secondary | ICD-10-CM

## 2021-09-02 DIAGNOSIS — D509 Iron deficiency anemia, unspecified: Secondary | ICD-10-CM | POA: Diagnosis not present

## 2021-09-02 DIAGNOSIS — I700 Atherosclerosis of aorta: Secondary | ICD-10-CM | POA: Diagnosis not present

## 2021-09-02 DIAGNOSIS — I25119 Atherosclerotic heart disease of native coronary artery with unspecified angina pectoris: Secondary | ICD-10-CM | POA: Diagnosis not present

## 2021-09-02 DIAGNOSIS — E1165 Type 2 diabetes mellitus with hyperglycemia: Secondary | ICD-10-CM | POA: Diagnosis not present

## 2021-09-02 LAB — VALPROIC ACID LEVEL: Valproic Acid Lvl: 38.8 mg/L — ABNORMAL LOW (ref 50.0–100.0)

## 2021-09-02 NOTE — Progress Notes (Signed)
Patient ID: Holly Rose, female   DOB: 1930/03/13, 87 y.o.   MRN: [id number]   Subjective:    Patient ID: Holly Rose, female    DOB: Sep 29, 1929, 87 y.o.   MRN: [id number]  This visit occurred during the SARS-CoV-2 public health emergency.  Safety protocols were in place, including screening questions prior to the visit, additional usage of staff PPE, and extensive cleaning of exam room while observing appropriate contact time as indicated for disinfecting solutions.   Patient here for a scheduled follow up.   HPI F/u regarding history of afib, breast cancer, diabetes, cholesterol and toe/foot ulcer.  She is seeing podiatry regularly.  Home health coming out now.  Wound being followed. On augmentin.  Tolerating.  Care givers coming in to the home.  Discussed palliative care.  She wants to discuss wit her husband.  Will notify me if desires.  Still has issues with delusions and hallucinations.  Seeing Dr Nicolasa Ducking.  On remeron and depakote. Depakote level just checked.  Discussed with Dr Nicolasa Ducking. No changes recommended at this time.  Eating.  No nausea or vomiting reported.  Weight is down.  No abdominal pain reported.  Discussed labs.     Past Medical History:  Diagnosis Date   Allergy    Anemia    Arthritis    Atrial fibrillation ([place]) 2004   Breast cancer, left ([place]) 10/2017   Lumpectomy and rad tx's.    CAD (coronary artery disease)    Complication of anesthesia    hard to wake up with general anesthesia   Diabetes ([place])    Dysrhythmia    A-fib   Heart attack ([place]) 2008   Heart murmur    History of kidney stones    History of sick sinus syndrome    s/p pacemaker placement   Hyperlipidemia    Macular degeneration    Neuropathy    Neuropathy    Pacemaker 06/2010   Personal history of radiation therapy 11/2017   LEFT lumpectomy    Squamous cell skin cancer    TIA (transient ischemic attack)    Past Surgical History:  Procedure Laterality Date   ABDOMINAL HYSTERECTOMY   1960's   [place] Left 12/17/2016   SINGLE FRAGMENT OF ATYPICAL EPITHELIAL CELLS   BREAST BIOPSY Left 04/08/2017   [facility]   BREAST EXCISIONAL BIOPSY Left 01/18/1986   Benign microcalcifications, [employer].   BREAST LUMPECTOMY Left 05/26/2017   13 mm,T1c, N0; ER/ PR+, Her 2 neu negative, HIGH RISK by Mammoprint ;  Surgeon: [name], MD;  Location: [facility] ORS;  Service: General;  Laterality: Left;   [place] PROPOFOL N/A 09/12/2015   Procedure: COLONOSCOPY WITH PROPOFOL;  Surgeon: [name], MD;  Location: [facility] ENDOSCOPY;  Service: Endoscopy;  Laterality: N/A;   ERCP N/A 02/21/2021   Procedure: ENDOSCOPIC RETROGRADE CHOLANGIOPANCREATOGRAPHY (ERCP);  Surgeon: Lucilla Lame, MD;  Location: [facility] ENDOSCOPY;  Service: Endoscopy;  Laterality: N/A;   IMPLANTABLE CARDIOVERTER DEFIBRILLATOR (ICD) GENERATOR CHANGE Left 03/29/2019   Procedure: PACEMAKER CHANGE OUT;  Surgeon: [name], MD;  Location: [facility] ORS;  Service: Cardiovascular;  Laterality: Left;   KIDNEY STONE SURGERY  2018   MASS EXCISION Left 02/24/2021   Procedure: EXCISION MASS SCALP;  Surgeon: [name], MD;  Location:  [facility] ORS;  Service: General;  Laterality: Left;   MOHS SURGERY  2019   forehead   pace maker  2008   [place]   TONSILLECTOMY AND ADENOIDECTOMY  33"s   Family History  Problem Relation Age of Onset   Stroke Mother    Diabetes Mother    Cancer Maternal Aunt        Breast Cancer   Breast cancer Maternal Aunt    Arthritis Maternal Grandmother    Cancer Maternal Aunt        Lung Cancer   Diabetes Son    Cancer Son    Kidney disease Neg Hx    Bladder Cancer Neg Hx    Social History   Socioeconomic History   Marital status: Married    Spouse name: Tom   Number of children: 2   Years of education:  Not on file   Highest education level: Not on file  Occupational History   Not on file  Tobacco Use   Smoking status: Former    Years: 5.00    Types: Cigarettes    Quit date: 09/14/1968    Years since quitting: 53.0   Smokeless tobacco: Never   Tobacco comments:    quit 1970  Vaping Use   Vaping Use: Never used  Substance and Sexual Activity   Alcohol use: No    Alcohol/week: 0.0 standard drinks   Drug use: No   Sexual activity: Not on file  Other Topics Concern   Not on file  Social History Narrative   Live home with husband in [place] Resource Strain: Low Risk    Difficulty of Paying Living Expenses: Not hard at all  Food Insecurity: No Food Insecurity   Worried About Charity fundraiser in the Last Year: Never true   Arboriculturist in the Last Year: Never true  Transportation Needs: No Transportation Needs   Lack of Transportation (Medical): No   Lack of Transportation (Non-Medical): No  Physical Activity: Insufficiently Active   Days of Exercise per Week: 2 days   Minutes of Exercise per Session: 20 min  Stress: No Stress Concern Present   Feeling of Stress : Not at all  Social Connections: Moderately Integrated   Frequency of Communication with Friends and Family: Three times a week   Frequency of Social Gatherings with Friends and Family: Twice a week   Attends Religious Services: More than 4 times per year   Active Member of [employer] or Organizations: No   Attends Archivist Meetings: Never   Marital Status: Married     Review of Systems  Constitutional:  Positive for fatigue.       Some weight loss as outlined.    HENT:  Negative for congestion and sinus pressure.   Respiratory:  Negative for cough and chest tightness.        Breathing stable.   Cardiovascular:  Negative for chest pain, palpitations and leg swelling.  Gastrointestinal:  Negative for abdominal pain, diarrhea, nausea and  vomiting.  Genitourinary:  Negative for difficulty urinating and dysuria.  Musculoskeletal:  Negative for joint swelling and myalgias.  Skin:  Negative for color change and rash.       Being followed by foot ulceration as outlined.    Neurological:  Negative for dizziness, light-headedness and headaches.  Psychiatric/Behavioral:  Negative for agitation and dysphoric mood.  Objective:     BP 110/62    Pulse 72    Temp 97.6 F (36.4 C)    Resp 16    Ht _0  (1.6 m)    Wt 109 lb 9.6 oz (49.7 kg)    LMP  (LMP Unknown)    SpO2 97%    BMI 19.41 kg/m  Wt Readings from Last 3 Encounters:  09/02/21 109 lb 9.6 oz (49.7 kg)  05/29/21 113 lb 11.2 oz (51.6 kg)  05/01/21 114 lb (51.7 kg)    Physical Exam Vitals reviewed.  Constitutional:      General: She is not in acute distress.    Appearance: Normal appearance.  HENT:     Head: Normocephalic and atraumatic.     Right Ear: External ear normal.     Left Ear: External ear normal.  Eyes:     General: No scleral icterus.       Right eye: No discharge.        Left eye: No discharge.     Conjunctiva/sclera: Conjunctivae normal.  Neck:     Thyroid: No thyromegaly.  Cardiovascular:     Rate and Rhythm: Normal rate and regular rhythm.  Pulmonary:     Effort: No respiratory distress.     Breath sounds: Normal breath sounds. No wheezing.  Abdominal:     General: Bowel sounds are normal.     Palpations: Abdomen is soft.     Tenderness: There is no abdominal tenderness.  Musculoskeletal:        General: No swelling or tenderness.     Cervical back: Neck supple. No tenderness.  Lymphadenopathy:     Cervical: No cervical adenopathy.  Skin:    Findings: No rash.     Comments: Foot bandages - foot ulcer.    Neurological:     Mental Status: She is alert.  Psychiatric:        Mood and Affect: Mood normal.        Behavior: Behavior normal.     Outpatient Encounter Medications as of 09/02/2021  Medication Sig   acetaminophen  (TYLENOL) 325 MG tablet Take 2 tablets (650 mg total) by mouth every 6 (six) hours as needed for mild pain (or Fever >/= 101).   Aflibercept 2 MG/0.05ML SOLN 1 each by Intravitreal route every 8 (eight) weeks.   apixaban (ELIQUIS) 2.5 MG TABS tablet Take 1 tablet (2.5 mg total) by mouth 2 (two) times daily.   atorvastatin (LIPITOR) 40 MG tablet Take 1 tablet (40 mg total) by mouth daily.   atorvastatin (LIPITOR) 40 MG tablet Take 1 tablet by mouth daily.   Biotin w/ Vitamins C & E (HAIR/SKIN/NAILS PO) Take 3 tablets by mouth daily. 2 in the am, 1 in the pm   blood glucose meter kit and supplies Per patient please dispense One touch Ultra2 meter.Use meter and supplies three times a day to check blood sugar. ICD10: E11.40   Blood Glucose Monitoring Suppl (FREESTYLE LITE) DEVI 2 (two) times daily.   Cholecalciferol (VITAMIN D) 50 MCG (2000 UT) CAPS Take 4,000 Units by mouth daily.   denosumab (PROLIA) 60 MG/ML SOSY injection Inject 60 mg into the skin every 6 (six) months.   divalproex (DEPAKOTE ER) 500 MG 24 hr tablet Take 500 mg by mouth daily.   furosemide (LASIX) 20 MG tablet Take 1 tablet (20 mg total) by mouth daily.   glucose blood (FREESTYLE LITE) test strip Used to check blood sugars twice a day. DX  E11.9   isosorbide mononitrate (IMDUR) 30 MG 24 hr tablet Take 1 tablet (30 mg total) by mouth daily.   Lancets (FREESTYLE) lancets 2 (two) times daily.   Magnesium 200 MG TABS Take 200 mg by mouth 2 (two) times a day.   meclizine (ANTIVERT) 12.5 MG tablet Take 1 tablet (12.5 mg total) by mouth 2 (two) times daily as needed for dizziness.   metoprolol tartrate (LOPRESSOR) 25 MG tablet Take 25 mg by mouth 2 (two) times daily.   mirtazapine (REMERON) 15 MG tablet Take 15 mg by mouth at bedtime.   Multiple Vitamins-Minerals (PRESERVISION AREDS 2 PO) Take 1 tablet by mouth 2 (two) times daily.    mupirocin ointment (BACTROBAN) 2 % Apply 1 application topically 2 (two) times daily.   Polyethyl  Glycol-Propyl Glycol (SYSTANE OP) Apply 1 drop to eye 2 (two) times daily.   Probiotic Product (PROBIOTIC DAILY PO) Take by mouth.   tamoxifen (NOLVADEX) 20 MG tablet Take 1 tablet by mouth once daily   vitamin B-12 (CYANOCOBALAMIN) 500 MCG tablet Take 500 mcg by mouth 2 (two) times a day.   [DISCONTINUED] amoxicillin-clavulanate (AUGMENTIN) 875-125 MG tablet Take 1 tablet by mouth 2 (two) times daily.   [DISCONTINUED] metFORMIN (GLUCOPHAGE-XR) 500 MG 24 hr tablet Take 1 tablet by mouth twice daily   No facility-administered encounter medications on file as of 09/02/2021.     Lab Results  Component Value Date   WBC 4.1 08/12/2021   HGB 10.2 (L) 08/12/2021   HCT 31.7 (L) 08/12/2021   PLT 130 (L) 08/12/2021   GLUCOSE 111 (H) 09/01/2021   CHOL 119 09/01/2021   TRIG 55.0 09/01/2021   HDL 53.00 09/01/2021   LDLCALC 55 09/01/2021   ALT 14 09/01/2021   AST 20 09/01/2021   NA 137 09/01/2021   K 4.1 09/01/2021   CL 102 09/01/2021   CREATININE 0.66 09/01/2021   BUN 23 09/01/2021   CO2 26 09/01/2021   TSH 5.64 (H) 09/01/2021   INR 1.4 (H) 12/26/2020   HGBA1C 6.2 09/01/2021   MICROALBUR <0.7 05/01/2021    MM 3D SCREEN BREAST BILATERAL  Result Date: 04/04/2021 CLINICAL DATA:  Screening. EXAM: DIGITAL SCREENING BILATERAL MAMMOGRAM WITH TOMOSYNTHESIS AND CAD TECHNIQUE: Bilateral screening digital craniocaudal and mediolateral oblique mammograms were obtained. Bilateral screening digital breast tomosynthesis was performed. The images were evaluated with computer-aided detection. COMPARISON:  Previous exam(s). ACR Breast Density Category b: There are scattered areas of fibroglandular density. FINDINGS: There are no findings suspicious for malignancy. IMPRESSION: No mammographic evidence of malignancy. A result letter of this screening mammogram will be mailed directly to the patient. RECOMMENDATION: Screening mammogram in one year. (Code:[id number]-B-01Y) BI-RADS CATEGORY  1: Negative. Electronically  Signed   By: Lajean Manes M.D.   On: 04/04/2021 15:14      Assessment & Plan:   Problem List Items Addressed This Visit     Aortic atherosclerosis ([place])    Continue lipitor.       Atrial fibrillation (HCC)    Continue eliquis and metoprolol.  Stable.  Follow.       Breast cancer, left ([place])    S/p XRT. Continue tamoxifen.       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  Currently without chest pain.  Follow.        Emphysema lung (HCC)    Changes c/w emphysema - noted on CT.  Breathing stable.  No increased cough or congestion.  No sob.  Follow.       Foot ulcer ([place])    Being followed by podiatry.  Dressing changes.  Home health.  Follow.  Given persistent       Hyperlipidemia    Continue lipitor.  Follow lipid panel and liver function tests.       Iron deficiency anemia    Follow cbc and iron studies.        Type II diabetes mellitus (HCC)    Continue metformin - given elevated blood sugars.  Eating.  Follow met b and a1c.       Vitamin D deficiency, unspecified    Follow vitamin D level.          [name], MD

## 2021-09-04 DIAGNOSIS — Z20822 Contact with and (suspected) exposure to covid-19: Secondary | ICD-10-CM | POA: Diagnosis not present

## 2021-09-05 DIAGNOSIS — Z20822 Contact with and (suspected) exposure to covid-19: Secondary | ICD-10-CM | POA: Diagnosis not present

## 2021-09-09 ENCOUNTER — Other Ambulatory Visit: Payer: Self-pay | Admitting: Internal Medicine

## 2021-09-10 ENCOUNTER — Encounter: Payer: Self-pay | Admitting: Podiatry

## 2021-09-10 ENCOUNTER — Other Ambulatory Visit: Payer: Self-pay

## 2021-09-10 ENCOUNTER — Ambulatory Visit (INDEPENDENT_AMBULATORY_CARE_PROVIDER_SITE_OTHER): Payer: Medicare Other | Admitting: Podiatry

## 2021-09-10 DIAGNOSIS — L97524 Non-pressure chronic ulcer of other part of left foot with necrosis of bone: Secondary | ICD-10-CM | POA: Diagnosis not present

## 2021-09-10 DIAGNOSIS — F29 Unspecified psychosis not due to a substance or known physiological condition: Secondary | ICD-10-CM | POA: Diagnosis not present

## 2021-09-10 DIAGNOSIS — L84 Corns and callosities: Secondary | ICD-10-CM

## 2021-09-10 DIAGNOSIS — E1142 Type 2 diabetes mellitus with diabetic polyneuropathy: Secondary | ICD-10-CM

## 2021-09-10 DIAGNOSIS — F5105 Insomnia due to other mental disorder: Secondary | ICD-10-CM | POA: Diagnosis not present

## 2021-09-10 DIAGNOSIS — L97522 Non-pressure chronic ulcer of other part of left foot with fat layer exposed: Secondary | ICD-10-CM

## 2021-09-10 DIAGNOSIS — F39 Unspecified mood [affective] disorder: Secondary | ICD-10-CM | POA: Diagnosis not present

## 2021-09-10 MED ORDER — AMOXICILLIN 500 MG PO CAPS: 500.0000 mg | ORAL_CAPSULE | Freq: Three times a day (TID) | ORAL | 0 refills | Status: AC

## 2021-09-11 NOTE — Progress Notes (Signed)
She presents today for follow-up of the ulcerations and osteomyelitis to the second digital stump left foot.  Her daughter-in-law states that it appears that the hallux on the right side is starting to be rubbed as well.  Objective: She is alert pulses are palpable she does have reactive hyperkeratosis to the distal aspect of the hallux right as well as the hallux left hallux left still demonstrates a superficial ulceration which was debrided today.  I also debrided the ulcerative lesion to the second stump.  Currently nothing is open and she looks like she is doing quite well at this point.  She continues to take her Augmentin.  Assessment: Osteomyelitis and chronic ulcerations left foot due to preulcerative lesion hallux right foot.  Plan: At this point as long as we can continue to keep her on a low-dose of antibiotic I think that the best choice I do not think that she would be able to undergo surgery at this point.  We are going to take away the clavulanic acid and see if we can maintain a skin integrity without infection.  We will do 500 mg twice a day of amoxicillin.  If this works we will drop down to 250 mg a day the next time I see her in 2 weeks.  Continue to use dry sterile compressive dressing daily.

## 2021-09-12 ENCOUNTER — Encounter: Payer: Self-pay | Admitting: Internal Medicine

## 2021-09-12 DIAGNOSIS — L97509 Non-pressure chronic ulcer of other part of unspecified foot with unspecified severity: Secondary | ICD-10-CM | POA: Insufficient documentation

## 2021-09-12 NOTE — Assessment & Plan Note (Signed)
Follow vitamin D level.  

## 2021-09-12 NOTE — Assessment & Plan Note (Signed)
Follow cbc and iron studies.  

## 2021-09-12 NOTE — Assessment & Plan Note (Signed)
Continue lipitor  ?

## 2021-09-12 NOTE — Assessment & Plan Note (Signed)
Continue eliquis and metoprolol.  Stable.  Follow.

## 2021-09-12 NOTE — Assessment & Plan Note (Signed)
Continue lipitor.  Follow lipid panel and liver function tests.   

## 2021-09-12 NOTE — Assessment & Plan Note (Signed)
Continue isosorbide, metoprolol and lipitor.  Currently without chest pain.  Follow.

## 2021-09-12 NOTE — Assessment & Plan Note (Signed)
S/p XRT. Continue tamoxifen.

## 2021-09-12 NOTE — Assessment & Plan Note (Addendum)
Being followed by podiatry.  Dressing changes.  Home health.  Follow.  Given persistent

## 2021-09-12 NOTE — Assessment & Plan Note (Signed)
Changes c/w emphysema - noted on CT.  Breathing stable.  No increased cough or congestion.  No sob.  Follow.

## 2021-09-12 NOTE — Assessment & Plan Note (Signed)
Continue metformin - given elevated blood sugars.  Eating.  Follow met b and a1c.  

## 2021-09-14 DIAGNOSIS — E1142 Type 2 diabetes mellitus with diabetic polyneuropathy: Secondary | ICD-10-CM | POA: Diagnosis not present

## 2021-09-14 DIAGNOSIS — M1990 Unspecified osteoarthritis, unspecified site: Secondary | ICD-10-CM | POA: Diagnosis not present

## 2021-09-14 DIAGNOSIS — Z95810 Presence of automatic (implantable) cardiac defibrillator: Secondary | ICD-10-CM | POA: Diagnosis not present

## 2021-09-14 DIAGNOSIS — D2372 Other benign neoplasm of skin of left lower limb, including hip: Secondary | ICD-10-CM | POA: Diagnosis not present

## 2021-09-14 DIAGNOSIS — I495 Sick sinus syndrome: Secondary | ICD-10-CM | POA: Diagnosis not present

## 2021-09-14 DIAGNOSIS — Z9841 Cataract extraction status, right eye: Secondary | ICD-10-CM | POA: Diagnosis not present

## 2021-09-14 DIAGNOSIS — D689 Coagulation defect, unspecified: Secondary | ICD-10-CM | POA: Diagnosis not present

## 2021-09-14 DIAGNOSIS — Z89422 Acquired absence of other left toe(s): Secondary | ICD-10-CM | POA: Diagnosis not present

## 2021-09-14 DIAGNOSIS — Z9842 Cataract extraction status, left eye: Secondary | ICD-10-CM | POA: Diagnosis not present

## 2021-09-14 DIAGNOSIS — D2371 Other benign neoplasm of skin of right lower limb, including hip: Secondary | ICD-10-CM | POA: Diagnosis not present

## 2021-09-14 DIAGNOSIS — Z7901 Long term (current) use of anticoagulants: Secondary | ICD-10-CM | POA: Diagnosis not present

## 2021-09-14 DIAGNOSIS — M810 Age-related osteoporosis without current pathological fracture: Secondary | ICD-10-CM | POA: Diagnosis not present

## 2021-09-14 DIAGNOSIS — D509 Iron deficiency anemia, unspecified: Secondary | ICD-10-CM | POA: Diagnosis not present

## 2021-09-14 DIAGNOSIS — H3530 Unspecified macular degeneration: Secondary | ICD-10-CM | POA: Diagnosis not present

## 2021-09-14 DIAGNOSIS — I252 Old myocardial infarction: Secondary | ICD-10-CM | POA: Diagnosis not present

## 2021-09-14 DIAGNOSIS — I2510 Atherosclerotic heart disease of native coronary artery without angina pectoris: Secondary | ICD-10-CM | POA: Diagnosis not present

## 2021-09-14 DIAGNOSIS — L97522 Non-pressure chronic ulcer of other part of left foot with fat layer exposed: Secondary | ICD-10-CM | POA: Diagnosis not present

## 2021-09-14 DIAGNOSIS — I4891 Unspecified atrial fibrillation: Secondary | ICD-10-CM | POA: Diagnosis not present

## 2021-09-14 DIAGNOSIS — Z170 Estrogen receptor positive status [ER+]: Secondary | ICD-10-CM | POA: Diagnosis not present

## 2021-09-14 DIAGNOSIS — B351 Tinea unguium: Secondary | ICD-10-CM | POA: Diagnosis not present

## 2021-09-14 DIAGNOSIS — C50412 Malignant neoplasm of upper-outer quadrant of left female breast: Secondary | ICD-10-CM | POA: Diagnosis not present

## 2021-09-14 DIAGNOSIS — E785 Hyperlipidemia, unspecified: Secondary | ICD-10-CM | POA: Diagnosis not present

## 2021-09-14 DIAGNOSIS — E11621 Type 2 diabetes mellitus with foot ulcer: Secondary | ICD-10-CM | POA: Diagnosis not present

## 2021-09-14 DIAGNOSIS — Z7984 Long term (current) use of oral hypoglycemic drugs: Secondary | ICD-10-CM | POA: Diagnosis not present

## 2021-09-14 DIAGNOSIS — Z950 Presence of cardiac pacemaker: Secondary | ICD-10-CM | POA: Diagnosis not present

## 2021-09-17 DIAGNOSIS — E11621 Type 2 diabetes mellitus with foot ulcer: Secondary | ICD-10-CM | POA: Diagnosis not present

## 2021-09-17 DIAGNOSIS — L97522 Non-pressure chronic ulcer of other part of left foot with fat layer exposed: Secondary | ICD-10-CM | POA: Diagnosis not present

## 2021-09-17 DIAGNOSIS — C50412 Malignant neoplasm of upper-outer quadrant of left female breast: Secondary | ICD-10-CM | POA: Diagnosis not present

## 2021-09-17 DIAGNOSIS — D509 Iron deficiency anemia, unspecified: Secondary | ICD-10-CM | POA: Diagnosis not present

## 2021-09-17 DIAGNOSIS — M810 Age-related osteoporosis without current pathological fracture: Secondary | ICD-10-CM | POA: Diagnosis not present

## 2021-09-17 DIAGNOSIS — E1142 Type 2 diabetes mellitus with diabetic polyneuropathy: Secondary | ICD-10-CM | POA: Diagnosis not present

## 2021-09-22 DIAGNOSIS — H353211 Exudative age-related macular degeneration, right eye, with active choroidal neovascularization: Secondary | ICD-10-CM | POA: Diagnosis not present

## 2021-09-24 ENCOUNTER — Other Ambulatory Visit: Payer: Self-pay

## 2021-09-24 ENCOUNTER — Encounter: Payer: Self-pay | Admitting: Podiatry

## 2021-09-24 ENCOUNTER — Encounter: Payer: Self-pay | Admitting: Oncology

## 2021-09-24 ENCOUNTER — Ambulatory Visit (INDEPENDENT_AMBULATORY_CARE_PROVIDER_SITE_OTHER): Payer: Medicare Other | Admitting: Podiatry

## 2021-09-24 ENCOUNTER — Encounter: Payer: Self-pay | Admitting: Internal Medicine

## 2021-09-24 DIAGNOSIS — L97522 Non-pressure chronic ulcer of other part of left foot with fat layer exposed: Secondary | ICD-10-CM

## 2021-09-24 DIAGNOSIS — L84 Corns and callosities: Secondary | ICD-10-CM

## 2021-09-24 DIAGNOSIS — E1140 Type 2 diabetes mellitus with diabetic neuropathy, unspecified: Secondary | ICD-10-CM

## 2021-09-24 DIAGNOSIS — E1142 Type 2 diabetes mellitus with diabetic polyneuropathy: Secondary | ICD-10-CM | POA: Diagnosis not present

## 2021-09-24 DIAGNOSIS — L97524 Non-pressure chronic ulcer of other part of left foot with necrosis of bone: Secondary | ICD-10-CM

## 2021-09-24 DIAGNOSIS — L97529 Non-pressure chronic ulcer of other part of left foot with unspecified severity: Secondary | ICD-10-CM

## 2021-09-24 MED ORDER — AMOXICILLIN 250 MG PO CAPS: 250.0000 mg | ORAL_CAPSULE | Freq: Three times a day (TID) | ORAL | 1 refills | Status: DC

## 2021-09-24 NOTE — Progress Notes (Signed)
She presents today for follow-up of her ulceration and osteomyelitis to the second digit of her left foot.  Her daughter-in-law states that it looks like it is doing better.  She goes on to say that she is having some loose bowel movements but contributes that not only to possibly the antibiotic but also to her metformin.  States that her blood sugars have recently been running in the 80s.  Objective: Vital signs are stable alert and oriented x3.  There is no erythema edema cellulitis drainage or odor currently the wounds have healed and she has a dry flaky skin and the ulceration to the stump of the second toe is currently closed at this point.  I debrided all of the reactive skin tissue today making sure that there was nothing open.  Assessment: Osteomyelitis stump second toe left benign skin lesions bilateral closed ulcerative lesion medial aspect of the second stump.  Plan: Placed padding today redressed the foot and address Pat dry sterile compressive dressing and we are going to decrease her amoxicillin from 500 twice a day to 250 twice a day.  I will follow-up with her in 2 weeks her daughter-in-law will call me with questions or concerns.

## 2021-09-24 NOTE — Telephone Encounter (Signed)
Order placed for CCM referral.  Also see message for response regarding metformin.

## 2021-09-24 NOTE — Telephone Encounter (Signed)
See my chart messages.  Please call Holly Rose.  Ms [name] is having persistent diarrhea.  Needs stool checked for c.diff.

## 2021-09-25 ENCOUNTER — Telehealth: Payer: Self-pay

## 2021-09-25 NOTE — Chronic Care Management (AMB) (Signed)
Chronic Care Management   Note  09/25/2021 Name: JAZMARIE BIEVER MRN: [id number] DOB: Jan 07, 1930  Tacarra A Ledonne is a 88 y.o. year old female who is a primary care patient of [name], MD. I reached out to [employer] by phone today in response to a referral sent by Ms. Florella A Mayse's PCP.  Ms. Olarte was given information about Chronic Care Management services today including:  CCM service includes personalized support from designated clinical staff supervised by her physician, including individualized plan of care and coordination with other care providers 24/7 contact phone numbers for assistance for urgent and routine care needs. Service will only be billed when office clinical staff spend 20 minutes or more in a month to coordinate care. Only one practitioner may furnish and bill the service in a calendar month. The patient may stop CCM services at any time (effective at the end of the month) by phone call to the office staff. The patient is responsible for co-pay (up to 20% after annual deductible is met) if co-pay is required by the individual health plan.   Patient agreed to services and verbal consent obtained.   Follow up plan: Telephone appointment with care management team member scheduled for:09/30/2021  Noreene Larsson, [address], [place], [place] [id number] Direct Dial: [phone] [email or web address] Website: [email or web address]

## 2021-09-26 NOTE — Telephone Encounter (Signed)
Called Holly Rose. She would prefer to hold off on stool studies for now because diarrhea has improved significantly since stopping metformin and abx. She would like to see how she does over the weekend and let me know Monday if she would like to proceed with stool studies.

## 2021-09-29 ENCOUNTER — Ambulatory Visit: Payer: Medicare Other

## 2021-09-29 ENCOUNTER — Ambulatory Visit: Payer: Medicare Other | Admitting: Oncology

## 2021-09-29 ENCOUNTER — Other Ambulatory Visit: Payer: Medicare Other

## 2021-09-30 ENCOUNTER — Telehealth: Payer: Medicare Other

## 2021-09-30 DIAGNOSIS — L97522 Non-pressure chronic ulcer of other part of left foot with fat layer exposed: Secondary | ICD-10-CM | POA: Diagnosis not present

## 2021-09-30 DIAGNOSIS — E1142 Type 2 diabetes mellitus with diabetic polyneuropathy: Secondary | ICD-10-CM | POA: Diagnosis not present

## 2021-09-30 DIAGNOSIS — D509 Iron deficiency anemia, unspecified: Secondary | ICD-10-CM | POA: Diagnosis not present

## 2021-09-30 DIAGNOSIS — E11621 Type 2 diabetes mellitus with foot ulcer: Secondary | ICD-10-CM | POA: Diagnosis not present

## 2021-09-30 DIAGNOSIS — C50412 Malignant neoplasm of upper-outer quadrant of left female breast: Secondary | ICD-10-CM | POA: Diagnosis not present

## 2021-09-30 DIAGNOSIS — M810 Age-related osteoporosis without current pathological fracture: Secondary | ICD-10-CM | POA: Diagnosis not present

## 2021-10-01 ENCOUNTER — Ambulatory Visit (INDEPENDENT_AMBULATORY_CARE_PROVIDER_SITE_OTHER): Payer: Medicare Other

## 2021-10-01 DIAGNOSIS — E1140 Type 2 diabetes mellitus with diabetic neuropathy, unspecified: Secondary | ICD-10-CM

## 2021-10-02 NOTE — Chronic Care Management (AMB) (Signed)
°Chronic Care Management  ° ° Clinical Social Work Note ° °10/02/2021 °Name: Holly Rose MRN: [id number] DOB: 01/18/1930 ° °Holly Rose is a 88 y.o. year old female who is a primary care patient of Scott, Charlene, MD. The CCM team was consulted to assist the patient with chronic disease management and/or care coordination needs related to: Community Resources .  ° °Collaboration with patient's daughter in law and Palliative Care  for initial visit in response to provider referral for social work chronic care management and care coordination services.  ° °Consent to Services:  °The patient was given the following information about Chronic Care Management services today, agreed to services, and gave verbal consent: 1. CCM service includes personalized support from designated clinical staff supervised by the primary care provider, including individualized plan of care and coordination with other care providers 2. 24/7 contact phone numbers for assistance for urgent and routine care needs. 3. Service will only be billed when office clinical staff spend 20 minutes or more in a month to coordinate care. 4. Only one practitioner may furnish and bill the service in a calendar month. 5.The patient may stop CCM services at any time (effective at the end of the month) by phone call to the office staff. 6. The patient will be responsible for cost sharing (co-pay) of up to 20% of the service fee (after annual deductible is met). Patient agreed to services and consent obtained. ° °Patient agreed to services and consent obtained.  ° °Assessment: Review of patient past medical history, allergies, medications, and health status, including review of relevant consultants reports was performed today as part of a comprehensive evaluation and provision of chronic care management and care coordination services.    ° °SDOH (Social Determinants of Health) assessments and interventions performed:   ° °Advanced Directives Status:  Not addressed in this encounter. ° °CCM Care Plan ° °Allergies  °Allergen Reactions  ° Lyrica [Pregabalin] Swelling  °  Sleepy, does not eat  ° Neurontin [Gabapentin] Nausea And Vomiting and Other (See Comments)  °  disoriented  ° Bactrim [Sulfamethoxazole-Trimethoprim]   °  Made her dizziness  ° ° °Outpatient Encounter Medications as of 10/01/2021  °Medication Sig Note  ° acetaminophen (TYLENOL) 325 MG tablet Take 2 tablets (650 mg total) by mouth every 6 (six) hours as needed for mild pain (or Fever >/= 101).   ° Aflibercept 2 MG/0.05ML SOLN 1 each by Intravitreal route every 8 (eight) weeks. 02/19/2021: Pt gets injections every 8 weeks in both eyes  ° amoxicillin (AMOXIL) 250 MG capsule Take 1 capsule (250 mg total) by mouth 3 (three) times daily.   ° apixaban (ELIQUIS) 2.5 MG TABS tablet Take 1 tablet (2.5 mg total) by mouth 2 (two) times daily.   ° atorvastatin (LIPITOR) 40 MG tablet Take 1 tablet (40 mg total) by mouth daily.   ° atorvastatin (LIPITOR) 40 MG tablet Take 1 tablet by mouth daily.   ° Biotin w/ Vitamins C & E (HAIR/SKIN/NAILS PO) Take 3 tablets by mouth daily. 2 in the am, 1 in the pm   ° blood glucose meter kit and supplies Per patient please dispense One touch Ultra2 meter.Use meter and supplies three times a day to check blood sugar. ICD10: E11.40   ° Blood Glucose Monitoring Suppl (FREESTYLE LITE) DEVI 2 (two) times daily.   ° Cholecalciferol (VITAMIN D) 50 MCG (2000 UT) CAPS Take 4,000 Units by mouth daily.   ° denosumab (PROLIA) 60 MG/ML SOSY injection Inject   60 mg into the skin every 6 (six) months.   ° divalproex (DEPAKOTE ER) 250 MG 24 hr tablet Take 250 mg by mouth daily.   ° divalproex (DEPAKOTE ER) 500 MG 24 hr tablet Take 500 mg by mouth daily.   ° furosemide (LASIX) 20 MG tablet Take 1 tablet (20 mg total) by mouth daily.   ° glucose blood (FREESTYLE LITE) test strip Used to check blood sugars twice a day. DX E11.9   ° isosorbide mononitrate (IMDUR) 30 MG 24 hr tablet Take 1 tablet (30  mg total) by mouth daily.   ° Lancets (FREESTYLE) lancets 2 (two) times daily.   ° Magnesium 200 MG TABS Take 200 mg by mouth 2 (two) times a day.   ° meclizine (ANTIVERT) 12.5 MG tablet Take 1 tablet (12.5 mg total) by mouth 2 (two) times daily as needed for dizziness.   ° metFORMIN (GLUCOPHAGE-XR) 500 MG 24 hr tablet Take 1 tablet by mouth twice daily   ° metoprolol tartrate (LOPRESSOR) 25 MG tablet Take 25 mg by mouth 2 (two) times daily.   ° mirtazapine (REMERON) 15 MG tablet Take 15 mg by mouth at bedtime.   ° Multiple Vitamins-Minerals (PRESERVISION AREDS 2 PO) Take 1 tablet by mouth 2 (two) times daily.    ° mupirocin ointment (BACTROBAN) 2 % Apply 1 application topically 2 (two) times daily.   ° Polyethyl Glycol-Propyl Glycol (SYSTANE OP) Apply 1 drop to eye 2 (two) times daily.   ° Probiotic Product (PROBIOTIC DAILY PO) Take by mouth.   ° tamoxifen (NOLVADEX) 20 MG tablet Take 1 tablet by mouth once daily   ° vitamin B-12 (CYANOCOBALAMIN) 500 MCG tablet Take 500 mcg by mouth 2 (two) times a day.   ° °No facility-administered encounter medications on file as of 10/01/2021.  ° ° °Patient Active Problem List  ° Diagnosis Date Noted  ° Foot ulcer (HCC) 09/12/2021  ° Aortic atherosclerosis (HCC) 05/11/2021  ° Emphysema lung (HCC) 05/11/2021  ° Choledocholithiasis   ° Acute pancreatitis 02/19/2021  ° Cholelithiasis   ° Osteoporosis 01/22/2021  ° Pancreatitis, acute 12/25/2020  ° Pancreatitis 12/25/2020  ° Dementia without behavioral disturbance (HCC)   ° Stroke (HCC) 07/22/2020  ° TIA (transient ischemic attack) 07/22/2020  ° Depression 07/22/2020  ° Visual hallucinations 07/21/2020  ° NSTEMI (non-ST elevated myocardial infarction) (HCC) 04/17/2020  ° Change in mental status 04/15/2020  ° Head injury 04/15/2020  ° Dysuria 04/30/2019  ° Frequent urinary tract infections 04/30/2019  ° Age-related osteoporosis without current pathological fracture 03/01/2019  ° Dizziness 08/29/2018  ° UTI (urinary tract infection)  08/15/2018  ° Vitamin D deficiency, unspecified 06/16/2017  ° Hypercalcinuria 06/14/2017  ° SCC (squamous cell carcinoma), face 03/29/2017  ° History of kidney stones 03/12/2017  ° Breast cancer, left (HCC) 12/28/2016  ° Chronic cystitis 11/05/2016  ° Nephrolithiasis 11/05/2016  ° Renal colic 11/05/2016  ° Urge incontinence 11/05/2016  ° Bilateral hand pain 11/03/2016  ° Generalized osteoarthritis of hand 11/03/2016  ° Numbness and tingling in both hands 11/03/2016  ° Bradycardia 04/15/2016  ° History of colonic polyps 09/16/2015  ° Abdominal pain 08/18/2015  ° Rectal bleeding 08/18/2015  ° Coronary artery disease with angina pectoris (HCC) 02/17/2015  ° Atrial fibrillation (HCC) 02/17/2015  ° Diabetes mellitus with neuropathy (HCC) 02/17/2015  ° Hyperlipidemia 02/17/2015  ° Iron deficiency anemia 02/17/2015  ° Macular degeneration 02/17/2015  ° Stress 02/17/2015  ° Health care maintenance 02/17/2015  ° Diarrhea 02/17/2015  ° Type II   diabetes mellitus (HCC) 07/13/2014  ° Diverticulosis 03/31/2013  ° GI bleeding 03/31/2013  ° Mitral valve prolapse 03/31/2013  ° Diverticulosis of intestine without perforation or abscess without bleeding 03/31/2013  ° ° °Conditions to be addressed/monitored:   °diabetes, neuropathy, foot ulceration osteomyelitis and memory deficits °Care Plan : General Social Work (Adult)  °Updates made by ,  M, LCSW since 10/02/2021 12:00 AM  °  ° °Problem: CHL AMB "PATIENT-SPECIFIC PROBLEM"   °Note:   °CARE PLAN ENTRY °(see longitudinal plan of care for additional care plan information) ° °Current Barriers:  °Patient with diabetes, neuropathy, foot ulceration and osteomyelitis and memory deficits in need of assistance with connection to community resources  °Knowledge deficits and need for support, education and care coordination related to community resources support  °Memory Deficits ° °Clinical Goal(s)  °Over the next 30 days, patient will follow up with Palliative Care for symptom  management ° °Interventions provided by LCSW:  °Assessed patient's care coordination needs related to in home care needs and consult for palliative care and discussed ongoing care management follow up  °Spoke with patient's daughter in law-[name] who has HCPOA and Durable POA °Patient diagnosed with Dementia and symptoms seem to be progressing, per daughter in law °Daughter in law and spouse recognize need for additional care °Confirmed that patient and her spouse reside [place] [place] and have a caregiver that comes in 6 days a week for 4 hours °Daughter interested in adding in Palliative Care, referral discussed with daughter in law °Collaboration phone call to [facility]-referral completed for palliative care °Advised [facility] that the majority of information gathered will need to be supplied by daughter in law as patient is easily agitated °Active listening / Reflection utilized   °Caregiver stress acknowledged, self care emphasized  ° ° °Patient Self Care Activities & Deficits:  °Patient is unable to independently navigate community resource options without care coordination support  °Patient's daughter in law will follow up with Palliative Care °Unable to perform ADLs independently °Unable to perform IADLs independently °Strong family or social support ° °Initial goal documentation ° ° °  °  ° °Follow Up Plan: SW will follow up with patient by phone over the next 14 business days °     ° , LCSW °[place] °[phone] ° ° ° °

## 2021-10-02 NOTE — Patient Instructions (Signed)
Visit Information  Thank you for taking time to visit with me today. Please don't hesitate to contact me if I can be of assistance to you before our next scheduled telephone appointment.  Following are the goals we discussed today:  - follow-up on any referrals for help I am given - think ahead to make sure my need does not become an emergency  Our next appointment is by telephone on 10/15/21 at 2pm  Please call the care guide team at [phone] if you need to cancel or reschedule your appointment.   If you are experiencing a Mental Health or [place] or need someone to talk to, please call the Suicide and Crisis Lifeline: 988   Following is a copy of your full plan of care:  Care Plan : General Social Work (Adult)  Updates made by [employer], Darla Lesches, LCSW since 10/02/2021 12:00 AM     Problem: CHL AMB "PATIENT-SPECIFIC PROBLEM"   Note:   CARE PLAN ENTRY (see longitudinal plan of care for additional care plan information)  Current Barriers:  Patient with diabetes, neuropathy, foot ulceration and osteomyelitis and memory deficits in need of assistance with connection to community resources  Knowledge deficits and need for support, education and care coordination related to community resources support  Memory Deficits  Clinical Goal(s)  Over the next 30 days, patient will follow up with Palliative Care for symptom management  Interventions provided by LCSW:  Assessed patient's care coordination needs related to in home care needs and consult for palliative care and discussed ongoing care management follow up  Spoke with patient's daughter in law-[name] who has HCPOA and Durable POA Patient diagnosed with Dementia and symptoms seem to be progressing, per daughter in law Daughter in Sports coach and spouse recognize need for additional care Confirmed that patient and her spouse reside in [place] ([facility] [place]) and have a caregiver that comes in 6 days a week for 4  hours Daughter interested in adding in [place], referral discussed with daughter in Designer, multimedia phone call to [employer] [facility] completed for palliative care Advised [facility] that the majority of information gathered will need to be supplied by daughter in law as patient is easily agitated Active listening / Reflection utilized   Caregiver stress acknowledged, self care emphasized    Patient Self Care Activities & Deficits:  Patient is unable to independently navigate community resource options without care coordination support  Patient's daughter in law will follow up with Palliative Care Unable to perform ADLs independently Unable to perform IADLs independently Strong family or social support  Initial goal documentation       Ms. Hurless was given information about Care Management services by the embedded care coordination team including:  Care Management services include personalized support from designated clinical staff supervised by her physician, including individualized plan of care and coordination with other care providers 24/7 contact phone numbers for assistance for urgent and routine care needs. The patient may stop CCM services at any time (effective at the end of the month) by phone call to the office staff.  Patient agreed to services and verbal consent obtained.   Patient verbalizes understanding of instructions and care plan provided today and agrees to view in [place]. Active MyChart status confirmed with patient.    Telephone follow up appointment with care management team member scheduled for: 10/15/21  Elliot Gurney, [place] [facility] [phone]

## 2021-10-06 ENCOUNTER — Encounter: Payer: Self-pay | Admitting: Podiatry

## 2021-10-06 DIAGNOSIS — H353221 Exudative age-related macular degeneration, left eye, with active choroidal neovascularization: Secondary | ICD-10-CM | POA: Diagnosis not present

## 2021-10-08 ENCOUNTER — Ambulatory Visit: Payer: Medicare Other | Admitting: Podiatry

## 2021-10-08 ENCOUNTER — Telehealth: Payer: Medicare Other

## 2021-10-09 ENCOUNTER — Telehealth: Payer: Medicare Other

## 2021-10-09 ENCOUNTER — Telehealth: Payer: Self-pay | Admitting: Student

## 2021-10-09 NOTE — Telephone Encounter (Signed)
Spoke with patient's daughter-in-law Samyah Bilbo, regarding the Palliative referral/services and she was in agreement with beginning services.  I have scheduled a Telehealth Consult for 10/14/21 @ 12:30 PM

## 2021-10-14 ENCOUNTER — Other Ambulatory Visit: Payer: Medicare Other | Admitting: Student

## 2021-10-21 ENCOUNTER — Other Ambulatory Visit: Payer: Medicare Other | Admitting: Student

## 2021-10-21 ENCOUNTER — Encounter: Payer: Self-pay | Admitting: Oncology

## 2021-10-21 ENCOUNTER — Other Ambulatory Visit: Payer: Self-pay

## 2021-10-21 DIAGNOSIS — Z515 Encounter for palliative care: Secondary | ICD-10-CM | POA: Diagnosis not present

## 2021-10-21 DIAGNOSIS — R630 Anorexia: Secondary | ICD-10-CM

## 2021-10-21 DIAGNOSIS — F0390 Unspecified dementia without behavioral disturbance: Secondary | ICD-10-CM

## 2021-10-21 NOTE — Progress Notes (Signed)
[place] [facility] Consult Note Telephone: [phone]  Fax: [phone]   Date of encounter: 10/21/21 12:56 PM PATIENT NAME: Holly Rose [id number]   [phone] (home)  DOB: 1930-02-19 MRN: [id number] PRIMARY CARE PROVIDER:    [name], MD,  [address] [place] [address] [phone]  REFERRING PROVIDER:   [name], [address] [place],  [place] [address] [phone]  RESPONSIBLE PARTY:    Contact Information     Name Relation Home Work [place] Relative   [place] [phone]  [phone]   [facility] Daughter   [phone]   Malachy Chamber   [phone]         Due to the COVID-19 crisis, this visit was done via telemedicine from my office and it was initiated and consent by this patient and or family.  I connected with  Holly Rose OR PROXY on 10/31/21 by telephone enabled telemedicine application and verified that I am speaking with the correct person using two identifiers.   I discussed the limitations of evaluation and management by telemedicine. The patient expressed understanding and agreed to proceed.   This home visit was completed via telephone due to the patient's inability to connect via an audiovisual connection or their refusal to have an in-person visit. This connection was agreed to by the patient. I verified that I am speaking with the correct person using two identifiers.                                   ASSESSMENT AND PLAN / RECOMMENDATIONS:   Advance Care Planning/Goals of Care: Goals include to maximize quality of life and symptom management. Patient/health care surrogate gave his/her permission to discuss.Our advance care planning conversation included a discussion about:    The value and importance of advance care planning   Experiences with loved ones who have been seriously ill or have died  Exploration of personal, cultural or spiritual beliefs that might influence medical decisions  Exploration of goals of care in the event of a sudden injury or illness  HCPOA Holly Rose, Holly Rose and Holly Rose. Decision not to resuscitate or to de-escalate disease focused treatments due to poor prognosis. CODE STATUS: DNR  Family would like for patient to remain in the home with additional support.  Education provided on palliative medicine versus hospice services.  Ongoing assessment for changes and declines.  Palliative medicine will continue to provide ongoing support  Symptom Management/Plan:  Dementia-patient requires assistance with adl's. Family and caregivers to reorient and redirect as needed. Monitor for falls/safety. Patient with agitation and hallucinations-continue Depakote and mirtazapine as directed.   Appetite-patient with decline in appetite. Continue mirtazapine QHS; offer foods patient enjoys.    Follow up Palliative Care Visit: Palliative care will continue to follow for complex medical decision making, advance care planning, and clarification of goals. Return in 2 weeks or prn.  I spent 30 minutes providing this consultation. More than 50% of the time in this consultation was spent in counseling and care coordination.  This visit was coded based on medical decision making (MDM).  PPS: 50%  HOSPICE ELIGIBILITY/DIAGNOSIS: TBD  Chief Complaint: Palliative Medicine initial consult.  HISTORY OF PRESENT ILLNESS:  Holly Rose is a 88 y.o. year old female  with dementia, T2DM, depression, stroke,  TIA, hallucinations, anemia, osteomyelitis of foot, breast CA-on tamoxifen, hx of NSTEMI.   Patient resides at home with husband.  She lives at the [facility] [place].  Daughter-in-law who is healthcare power of attorney, states patient has had worsening dementia since  November-December 2022.  She is also having increased hallucinations at night and gets up during the night.  Daughter-in-law Holly Rose states patient is up between 2 and 3 each morning and gets dressed for the day.  Husband does not report patient being agitated at this time.  Patient does have caregivers in the home 4 hours daily x 86 days a week.  Patient has weight loss in the past 1.5 years.  Her weight was 122 pounds April 2022; most recent weight is 109 pounds.  She is drinking boost nutritional supplement during the day. Family checks blood sugars each morning; usually around 90 mg/dL. She does require assistance with ADLs.  She uses a walker for ambulation, sometimes forgets walker.  No recent falls reported.  Patient had been having increased diarrhea, loose stools.  Her metformin was discontinued and antibiotic for wound/foot infection was stopped. Diarrhea has subsequently improved.  Home health completed about 2 to 3 weeks ago; family is performing foot care. HPI and ROS obtained from family due to her dementia.  History obtained from review of EMR, discussion with primary team, and interview with family, facility staff/caregiver and/or Holly Rose.  I reviewed available labs, medications, imaging, studies and related documents from the EMR.  Records reviewed and summarized above.      Physical Exam:  Deferred due to this being a telephonic visit.   CURRENT PROBLEM LIST:  Patient Active Problem List   Diagnosis Date Noted   Foot ulcer ([place]) 09/12/2021   Aortic atherosclerosis ([place]) 05/11/2021   Emphysema lung ([place]) 05/11/2021   Choledocholithiasis    Acute pancreatitis 02/19/2021   Cholelithiasis    Osteoporosis 01/22/2021   Pancreatitis, acute 12/25/2020   Pancreatitis 12/25/2020   Dementia without behavioral disturbance ([place])    Stroke ([place]) 07/22/2020   TIA (transient ischemic attack) 07/22/2020   Depression 07/22/2020   Visual hallucinations 07/21/2020   NSTEMI (non-ST elevated  myocardial infarction) ([place]) 04/17/2020   Change in mental status 04/15/2020   Head injury 04/15/2020   Dysuria 04/30/2019   Frequent urinary tract infections 04/30/2019   Age-related osteoporosis without current pathological fracture 03/01/2019   Dizziness 08/29/2018   UTI (urinary tract infection) 08/15/2018   Vitamin D deficiency, unspecified 06/16/2017   Hypercalcinuria 06/14/2017   SCC (squamous cell carcinoma), face 03/29/2017   History of kidney stones 03/12/2017   Breast cancer, left ([place]) 12/28/2016   Chronic cystitis 11/05/2016   Nephrolithiasis 87/68/1157   Renal colic 26/20/3559   Urge incontinence 11/05/2016   Bilateral hand pain 11/03/2016   Generalized osteoarthritis of hand 11/03/2016   Numbness and tingling in both hands 11/03/2016   Bradycardia 04/15/2016   History of colonic polyps 09/16/2015   Abdominal pain 08/18/2015   Rectal bleeding 08/18/2015   Coronary artery disease with angina pectoris ([place]) 02/17/2015   Atrial fibrillation ([place]) 02/17/2015   Diabetes mellitus with neuropathy ([place]) 02/17/2015   Hyperlipidemia 02/17/2015   Iron deficiency anemia 02/17/2015   Macular degeneration 02/17/2015   Stress 02/17/2015   Health care maintenance 02/17/2015   Diarrhea 02/17/2015   Type II diabetes mellitus ([place]) 07/13/2014   Diverticulosis 03/31/2013   GI bleeding 03/31/2013   Mitral valve prolapse 03/31/2013   Diverticulosis of intestine without perforation  or abscess without bleeding 03/31/2013   PAST MEDICAL HISTORY:  Active Ambulatory Problems    Diagnosis Date Noted   Coronary artery disease with angina pectoris ([place]) 02/17/2015   Atrial fibrillation ([place]) 02/17/2015   Diabetes mellitus with neuropathy ([place]) 02/17/2015   Hyperlipidemia 02/17/2015   Iron deficiency anemia 02/17/2015   Macular degeneration 02/17/2015   Stress 02/17/2015   Health care maintenance 02/17/2015   Diarrhea 02/17/2015   Abdominal pain 08/18/2015   Rectal bleeding  08/18/2015   History of colonic polyps 09/16/2015   Diverticulosis 03/31/2013   GI bleeding 03/31/2013   Mitral valve prolapse 03/31/2013   Type II diabetes mellitus ([place]) 07/13/2014   Bradycardia 04/15/2016   Diverticulosis of intestine without perforation or abscess without bleeding 03/31/2013   Breast cancer, left ([place]) 12/28/2016   History of kidney stones 03/12/2017   Bilateral hand pain 11/03/2016   Chronic cystitis 11/05/2016   Generalized osteoarthritis of hand 11/03/2016   Hypercalcinuria 06/14/2017   Nephrolithiasis 11/05/2016   Numbness and tingling in both hands 03/49/1791   Renal colic 50/56/9794   SCC (squamous cell carcinoma), face 03/29/2017   Urge incontinence 11/05/2016   Vitamin D deficiency, unspecified 06/16/2017   UTI (urinary tract infection) 08/15/2018   Dizziness 08/29/2018   Age-related osteoporosis without current pathological fracture 03/01/2019   Dysuria 04/30/2019   Frequent urinary tract infections 04/30/2019   Change in mental status 04/15/2020   Head injury 04/15/2020   NSTEMI (non-ST elevated myocardial infarction) ([place]) 04/17/2020   Visual hallucinations 07/21/2020   Stroke ([place]) 07/22/2020   TIA (transient ischemic attack) 07/22/2020   Depression 07/22/2020   Dementia without behavioral disturbance ([place])    Pancreatitis, acute 12/25/2020   Pancreatitis 12/25/2020   Osteoporosis 01/22/2021   Acute pancreatitis 02/19/2021   Cholelithiasis    Choledocholithiasis    Aortic atherosclerosis ([place]) 05/11/2021   Emphysema lung ([place]) 05/11/2021   Foot ulcer ([place]) 09/12/2021   Resolved Ambulatory Problems    Diagnosis Date Noted   Left breast lump 11/10/2016   Hematoma of breast 12/28/2016   Atrial fibrillation with RVR ([place]) 04/17/2020   CKD stage 3 due to type 2 diabetes mellitus ([place])    Past Medical History:  Diagnosis Date   Allergy    Anemia    Arthritis    CAD (coronary artery disease)    Complication of anesthesia    Diabetes  ([place])    Dysrhythmia    Heart attack ([place]) 2008   Heart murmur    History of sick sinus syndrome    Neuropathy    Neuropathy    Pacemaker 06/2010   Personal history of radiation therapy 11/2017   Squamous cell skin cancer    SOCIAL HX:  Social History   Tobacco Use   Smoking status: Former    Years: 5.00    Types: Cigarettes    Quit date: 09/14/1968    Years since quitting: 53.1   Smokeless tobacco: Never   Tobacco comments:    quit 1970  Substance Use Topics   Alcohol use: No    Alcohol/week: 0.0 standard drinks   FAMILY HX:  Family History  Problem Relation Age of Onset   Stroke Mother    Diabetes Mother    Cancer Maternal Aunt        Breast Cancer   Breast cancer Maternal Aunt    Arthritis Maternal Grandmother    Cancer Maternal Aunt        Lung Cancer   Diabetes Son  Cancer Son    Kidney disease Neg Hx    Bladder Cancer Neg Hx       ALLERGIES:  Allergies  Allergen Reactions   Lyrica [Pregabalin] Swelling    Sleepy, does not eat   Neurontin [Gabapentin] Nausea And Vomiting and Other (See Comments)    disoriented   Bactrim [Sulfamethoxazole-Trimethoprim]     Made her dizziness     PERTINENT MEDICATIONS:  Outpatient Encounter Medications as of 10/21/2021  Medication Sig   acetaminophen (TYLENOL) 325 MG tablet Take 2 tablets (650 mg total) by mouth every 6 (six) hours as needed for mild pain (or Fever >/= 101).   Aflibercept 2 MG/0.05ML SOLN 1 each by Intravitreal route every 8 (eight) weeks.   amoxicillin (AMOXIL) 250 MG capsule Take 1 capsule (250 mg total) by mouth 3 (three) times daily.   apixaban (ELIQUIS) 2.5 MG TABS tablet Take 1 tablet (2.5 mg total) by mouth 2 (two) times daily.   atorvastatin (LIPITOR) 40 MG tablet Take 1 tablet (40 mg total) by mouth daily.   atorvastatin (LIPITOR) 40 MG tablet Take 1 tablet by mouth daily.   Biotin w/ Vitamins C & E (HAIR/SKIN/NAILS PO) Take 3 tablets by mouth daily. 2 in the am, 1 in the pm   blood glucose  meter kit and supplies Per patient please dispense One touch Ultra2 meter.Use meter and supplies three times a day to check blood sugar. ICD10: E11.40   Blood Glucose Monitoring Suppl (FREESTYLE LITE) DEVI 2 (two) times daily.   Cholecalciferol (VITAMIN D) 50 MCG (2000 UT) CAPS Take 4,000 Units by mouth daily.   denosumab (PROLIA) 60 MG/ML SOSY injection Inject 60 mg into the skin every 6 (six) months.   divalproex (DEPAKOTE ER) 250 MG 24 hr tablet Take 250 mg by mouth daily.   divalproex (DEPAKOTE ER) 500 MG 24 hr tablet Take 500 mg by mouth daily.   furosemide (LASIX) 20 MG tablet Take 1 tablet (20 mg total) by mouth daily.   glucose blood (FREESTYLE LITE) test strip Used to check blood sugars twice a day. DX E11.9   isosorbide mononitrate (IMDUR) 30 MG 24 hr tablet Take 1 tablet (30 mg total) by mouth daily.   Lancets (FREESTYLE) lancets 2 (two) times daily.   Magnesium 200 MG TABS Take 200 mg by mouth 2 (two) times a day.   meclizine (ANTIVERT) 12.5 MG tablet Take 1 tablet (12.5 mg total) by mouth 2 (two) times daily as needed for dizziness.   metFORMIN (GLUCOPHAGE-XR) 500 MG 24 hr tablet Take 1 tablet by mouth twice daily   metoprolol tartrate (LOPRESSOR) 25 MG tablet Take 25 mg by mouth 2 (two) times daily.   mirtazapine (REMERON) 15 MG tablet Take 15 mg by mouth at bedtime.   Multiple Vitamins-Minerals (PRESERVISION AREDS 2 PO) Take 1 tablet by mouth 2 (two) times daily.    mupirocin ointment (BACTROBAN) 2 % Apply 1 application topically 2 (two) times daily.   Polyethyl Glycol-Propyl Glycol (SYSTANE OP) Apply 1 drop to eye 2 (two) times daily.   Probiotic Product (PROBIOTIC DAILY PO) Take by mouth.   tamoxifen (NOLVADEX) 20 MG tablet Take 1 tablet by mouth once daily   vitamin B-12 (CYANOCOBALAMIN) 500 MCG tablet Take 500 mcg by mouth 2 (two) times a day.   No facility-administered encounter medications on file as of 10/21/2021.   Thank you for the opportunity to participate in the care  of Holly Rose.  The palliative care team will continue to  follow. Please call our office at [phone] if we can be of additional assistance.   Ezekiel Slocumb, NP   COVID-19 PATIENT SCREENING TOOL Asked and negative response unless otherwise noted:  Have you had symptoms of covid, tested positive or been in contact with someone with symptoms/positive test in the past 5-10 days? No

## 2021-10-27 DIAGNOSIS — Z20822 Contact with and (suspected) exposure to covid-19: Secondary | ICD-10-CM | POA: Diagnosis not present

## 2021-11-03 DIAGNOSIS — H353211 Exudative age-related macular degeneration, right eye, with active choroidal neovascularization: Secondary | ICD-10-CM | POA: Diagnosis not present

## 2021-11-05 ENCOUNTER — Encounter: Payer: Self-pay | Admitting: Oncology

## 2021-11-05 ENCOUNTER — Encounter: Payer: Self-pay | Admitting: Internal Medicine

## 2021-11-05 ENCOUNTER — Other Ambulatory Visit: Payer: Self-pay

## 2021-11-05 ENCOUNTER — Other Ambulatory Visit: Payer: Medicare Other | Admitting: Student

## 2021-11-05 ENCOUNTER — Telehealth: Payer: Self-pay | Admitting: Internal Medicine

## 2021-11-05 ENCOUNTER — Ambulatory Visit (INDEPENDENT_AMBULATORY_CARE_PROVIDER_SITE_OTHER): Payer: Medicare Other | Admitting: *Deleted

## 2021-11-05 DIAGNOSIS — D509 Iron deficiency anemia, unspecified: Secondary | ICD-10-CM

## 2021-11-05 DIAGNOSIS — F0390 Unspecified dementia without behavioral disturbance: Secondary | ICD-10-CM

## 2021-11-05 DIAGNOSIS — R413 Other amnesia: Secondary | ICD-10-CM

## 2021-11-05 DIAGNOSIS — L97529 Non-pressure chronic ulcer of other part of left foot with unspecified severity: Secondary | ICD-10-CM | POA: Diagnosis not present

## 2021-11-05 DIAGNOSIS — Z515 Encounter for palliative care: Secondary | ICD-10-CM | POA: Diagnosis not present

## 2021-11-05 DIAGNOSIS — R630 Anorexia: Secondary | ICD-10-CM

## 2021-11-05 DIAGNOSIS — E1140 Type 2 diabetes mellitus with diabetic neuropathy, unspecified: Secondary | ICD-10-CM

## 2021-11-05 NOTE — Telephone Encounter (Signed)
Received a CCM from Education officer, museum.  Documented in note was that [name] had a fall.  Please call Holly Rose and see when fell and if she is doing ok.  Any problems?

## 2021-11-05 NOTE — Progress Notes (Signed)
[facility] Consult Note Telephone: [phone]  Fax: [phone]    Date of encounter: 11/05/21 10:14 AM PATIENT NAME: Holly Rose   [phone] (home)  DOB: 05/02/1930 MRN: [id number] PRIMARY CARE PROVIDER:    [name], MD,  [address] [place] [address] [phone]  REFERRING PROVIDER:   Einar Rose, [address] [place],  [place] [address] [phone]  RESPONSIBLE PARTY:    Contact Information     Name Relation Home Work [place] Relative   [place] [phone]  [phone]   [facility] Daughter   [phone]   Malachy Chamber   [phone]        I met face to face with patient and family in the home. Palliative Care was asked to follow this patient by consultation request of  Holly Pheasant, MD to address advance care planning and complex medical decision making. This is a follow up visit.                                   ASSESSMENT AND PLAN / RECOMMENDATIONS:   Advance Care Planning/Goals of Care: Goals include to maximize quality of life and symptom management. Patient/health care surrogate gave his/her permission to discuss. Our advance care planning conversation included a discussion about:    The value and importance of advance care planning  Experiences with loved ones who have been seriously ill or have died  Exploration of personal, cultural or spiritual beliefs that might influence medical decisions  Exploration of goals of care in the event of a sudden injury or illness  CODE STATUS: DNR  Symptom Management/Plan:  Dementia-patient requires assistance with adl's. Family and caregivers to reorient and redirect as needed. Monitor for falls/safety; encourage walker with ambulation. Patient with agitation and hallucinations-continue  Depakote and mirtazapine as directed.   Appetite-patient with fair appetite; continue mirtazapine QHS.  Encourage foods that patient enjoys.  Left foot wounds, hx of osteomyelitis-family to continue foot care, soaks as directed per Wound care/Dr. Milinda Rose; follow up as needed.   Anemia-patient with difficulty getting out of the home to appointments; daughter in law asks if palliative nurse can obtain labs before next appointment with Dr. Grayland Rose.   Follow up Palliative Care Visit: Palliative care will continue to follow for complex medical decision making, advance care planning, and clarification of goals. Return 6-8 weeks or prn.  This visit was coded based on medical decision making (MDM).  PPS: 50%  HOSPICE ELIGIBILITY/DIAGNOSIS: TBD  Chief Complaint: Palliative Medicine follow up visit.   HISTORY OF PRESENT ILLNESS:  Holly Rose is a 88 y.o. year old female  with dementia, T2DM, depression, stroke, TIA, hallucinations, anemia, osteomyelitis of foot, breast CA-on tamoxifen, hx of NSTEMI.    Patient had a fall last night.  Denies any pain or discomfort at this time.  She takes Tylenol for occasional pain.  She has daily caregivers; she does forget her walker at times.  Patient with agitation and hallucinations; daughter-in-law states that hallucinations do not appear to bother patient.  She endorses a fair appetite.  Daughter-in-law has been completing wound care to patient's left foot. Patient with pleasant mood; cooperative with assessment. 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 Follows up with Dr. Grayland Rose d/t her anemia and hx  of breast cancer; will need labs before upcoming appointment. A 10-point review of systems is negative, except for the pertinent positives and negatives detailed in the HPI.  History obtained from review of EMR, discussion with primary team, and interview with family, facility staff/caregiver and/or Ms. Holly Rose.  I reviewed available labs,  medications, imaging, studies and related documents from the EMR.  Records reviewed and summarized above.    Physical Exam:  Pulse 62, resp 16, b/p 104/68, sats 95% on room air Constitutional: NAD General: frail appearing, thin EYES: anicteric sclera, lids intact, no discharge  ENMT: intact hearing, oral mucous membranes moist, dentition intact CV: S1S2, RRR, 1+LE edema Pulmonary: LCTA, no increased work of breathing, no cough, room air Abdomen: normo-active BS + 4 quadrants, soft and non tender GU: deferred MSK: moves all extremities, ambulatory Skin: warm and dry, no rashes on visible skin, tip of 2nd left toe with hanging scab,  and left great toe. Neuro: generalized weakness,  A & O to person, familiars Psych: non-anxious affect, pleasant Hem/lymph/immuno: no widespread bruising   Thank you for the opportunity to participate in the care of Ms. Holly Rose.  The palliative care team will continue to follow. Please call our office at [phone] if we can be of additional assistance.   Holly Slocumb, NP   COVID-19 PATIENT SCREENING TOOL Asked and negative response unless otherwise noted:   Have you had symptoms of covid, tested positive or been in contact with someone with symptoms/positive test in the past 5-10 days? No

## 2021-11-05 NOTE — Chronic Care Management (AMB) (Signed)
Chronic Care Management    Clinical Social Work Note  11/05/2021 Name: Holly Rose MRN: [id number] DOB: [birth year]  Holly Rose is a 88 y.o. year old female who is a primary care patient of [name], MD. The CCM team was consulted to assist the patient with chronic disease management and/or care coordination needs related to: [employer] .   Collaboration with patient's daughter in law  for follow up visit in response to provider referral for social work chronic care management and care coordination services.   Consent to Services:  The patient was given information about Chronic Care Management services, agreed to services, and gave verbal consent prior to initiation of services.  Please see initial visit note for detailed documentation.   Patient agreed to services and consent obtained.   Assessment: Review of patient past medical history, allergies, medications, and health status, including review of relevant consultants reports was performed today as part of a comprehensive evaluation and provision of chronic care management and care coordination services.     SDOH (Social Determinants of Health) assessments and interventions performed:    Advanced Directives Status: Not addressed in this encounter.  CCM Care Plan  Allergies  Allergen Reactions   Lyrica [Pregabalin] Swelling    Sleepy, does not eat   Neurontin [Gabapentin] Nausea And Vomiting and Other (See Comments)    disoriented   Bactrim [Sulfamethoxazole-Trimethoprim]     Made her dizziness    Outpatient Encounter Medications as of 11/05/2021  Medication Sig Note   acetaminophen (TYLENOL) 325 MG tablet Take 2 tablets (650 mg total) by mouth every 6 (six) hours as needed for mild pain (or Fever >/= 101).    Aflibercept 2 MG/0.05ML SOLN 1 each by Intravitreal route every 8 (eight) weeks. 02/19/2021: Pt gets injections every 8 weeks in both eyes   amoxicillin (AMOXIL) 250 MG capsule Take 1 capsule  (250 mg total) by mouth 3 (three) times daily.    apixaban (ELIQUIS) 2.5 MG TABS tablet Take 1 tablet (2.5 mg total) by mouth 2 (two) times daily.    atorvastatin (LIPITOR) 40 MG tablet Take 1 tablet (40 mg total) by mouth daily.    atorvastatin (LIPITOR) 40 MG tablet Take 1 tablet by mouth daily.    Biotin w/ Vitamins C & E (HAIR/SKIN/NAILS PO) Take 3 tablets by mouth daily. 2 in the am, 1 in the pm    blood glucose meter kit and supplies Per patient please dispense One touch Ultra2 meter.Use meter and supplies three times a day to check blood sugar. ICD10: E11.40    Blood Glucose Monitoring Suppl (FREESTYLE LITE) DEVI 2 (two) times daily.    Cholecalciferol (VITAMIN D) 50 MCG (2000 UT) CAPS Take 4,000 Units by mouth daily.    denosumab (PROLIA) 60 MG/ML SOSY injection Inject 60 mg into the skin every 6 (six) months.    divalproex (DEPAKOTE ER) 250 MG 24 hr tablet Take 250 mg by mouth daily.    divalproex (DEPAKOTE ER) 500 MG 24 hr tablet Take 500 mg by mouth daily.    furosemide (LASIX) 20 MG tablet Take 1 tablet (20 mg total) by mouth daily.    glucose blood (FREESTYLE LITE) test strip Used to check blood sugars twice a day. DX E11.9    isosorbide mononitrate (IMDUR) 30 MG 24 hr tablet Take 1 tablet (30 mg total) by mouth daily.    Lancets (FREESTYLE) lancets 2 (two) times daily.    Magnesium 200 MG TABS Take 200  mg by mouth 2 (two) times a day. (Patient not taking: Reported on 11/05/2021)    meclizine (ANTIVERT) 12.5 MG tablet Take 1 tablet (12.5 mg total) by mouth 2 (two) times daily as needed for dizziness.    metFORMIN (GLUCOPHAGE-XR) 500 MG 24 hr tablet Take 1 tablet by mouth twice daily (Patient not taking: Reported on 11/05/2021)    metoprolol tartrate (LOPRESSOR) 25 MG tablet Take 25 mg by mouth 2 (two) times daily.    mirtazapine (REMERON) 15 MG tablet Take 15 mg by mouth at bedtime.    Multiple Vitamins-Minerals (PRESERVISION AREDS 2 PO) Take 1 tablet by mouth 2 (two) times daily.      mupirocin ointment (BACTROBAN) 2 % Apply 1 application topically 2 (two) times daily.    Polyethyl Glycol-Propyl Glycol (SYSTANE OP) Apply 1 drop to eye 2 (two) times daily.    Probiotic Product (PROBIOTIC DAILY PO) Take by mouth.    tamoxifen (NOLVADEX) 20 MG tablet Take 1 tablet by mouth once daily    vitamin B-12 (CYANOCOBALAMIN) 500 MCG tablet Take 500 mcg by mouth 2 (two) times a day.    No facility-administered encounter medications on file as of 11/05/2021.    Patient Active Problem List   Diagnosis Date Noted   Foot ulcer ([place]) 09/12/2021   Aortic atherosclerosis ([place]) 05/11/2021   Emphysema lung ([place]) 05/11/2021   Choledocholithiasis    Acute pancreatitis 02/19/2021   Cholelithiasis    Osteoporosis 01/22/2021   Pancreatitis, acute 12/25/2020   Pancreatitis 12/25/2020   Dementia without behavioral disturbance ([place])    Stroke ([place]) 07/22/2020   TIA (transient ischemic attack) 07/22/2020   Depression 07/22/2020   Visual hallucinations 07/21/2020   NSTEMI (non-ST elevated myocardial infarction) ([place]) 04/17/2020   Change in mental status 04/15/2020   Head injury 04/15/2020   Dysuria 04/30/2019   Frequent urinary tract infections 04/30/2019   Age-related osteoporosis without current pathological fracture 03/01/2019   Dizziness 08/29/2018   UTI (urinary tract infection) 08/15/2018   Vitamin D deficiency, unspecified 06/16/2017   Hypercalcinuria 06/14/2017   SCC (squamous cell carcinoma), face 03/29/2017   History of kidney stones 03/12/2017   Breast cancer, left ([place]) 12/28/2016   Chronic cystitis 11/05/2016   Nephrolithiasis 43/15/4008   Renal colic 67/61/9509   Urge incontinence 11/05/2016   Bilateral hand pain 11/03/2016   Generalized osteoarthritis of hand 11/03/2016   Numbness and tingling in both hands 11/03/2016   Bradycardia 04/15/2016   History of colonic polyps 09/16/2015   Abdominal pain 08/18/2015   Rectal bleeding 08/18/2015   Coronary artery disease  with angina pectoris ([place]) 02/17/2015   Atrial fibrillation ([place]) 02/17/2015   Diabetes mellitus with neuropathy ([place]) 02/17/2015   Hyperlipidemia 02/17/2015   Iron deficiency anemia 02/17/2015   Macular degeneration 02/17/2015   Stress 02/17/2015   Health care maintenance 02/17/2015   Diarrhea 02/17/2015   Type II diabetes mellitus ([place]) 07/13/2014   Diverticulosis 03/31/2013   GI bleeding 03/31/2013   Mitral valve prolapse 03/31/2013   Diverticulosis of intestine without perforation or abscess without bleeding 03/31/2013    Conditions to be addressed/monitored: Dementia; Memory Deficits  Care Plan : General Social Work (Adult)  Updates made by Vern Claude, LCSW since 11/05/2021 12:00 AM     Problem: CHL AMB "PATIENT-SPECIFIC PROBLEM"   Note:   CARE PLAN ENTRY (see longitudinal plan of care for additional care plan information)  Current Barriers:  Patient with diabetes, neuropathy, foot ulceration and osteomyelitis and memory deficits in need of  assistance with connection to community resources  Knowledge deficits and need for support, education and care coordination related to community resources support  Memory Deficits  Clinical Goal(s)  Over the next 30 days, patient will follow up with Palliative Care for symptom management  Interventions provided by LCSW:  Assessed patient's care coordination needs related to in home care needs and consult for palliative care and discussed ongoing care management follow up  Spoke with patient's daughter in law-[name] who has HCPOA and Durable POA Patient suffered a fall last night Daughter in law continues to confirm that patient and her spouse reside in [place] and have a caregiver that comes in 6 days a week for 4 hours. She is looking into adding a caregiver on Sundays as well for full week coverage to ensure safety Patient now receiving Palliative Care services, confirmed that they will be able to  draw labs in the home as well Patient's daughter in law confirmed having  no additional needs community resource needs as this time  Patient Self Care Activities & Deficits:  Patient is unable to independently navigate community resource options without care coordination support  Patient's daughter in law will follow up with Palliative Care Unable to perform ADLs independently Unable to perform IADLs independently Strong family or social support  Please see past updates related to this goal by clicking on the "Past Updates" button in the selected goal         Follow Up Plan:  Client's daughter will contact this social worker with any additional community resource needs.      [address] [place] [phone]

## 2021-11-05 NOTE — Patient Instructions (Signed)
Visit Information  Thank you for taking time to visit with me today. Please don't hesitate to contact me if I can be of assistance to you before our next scheduled telephone appointment.  Following are the goals we discussed today:  - follow-up on any referrals for help I am given - think ahead to make sure my need does not become an emergency     If you are experiencing a Mental Health or [place] or need someone to talk to, please call the Suicide and Crisis Lifeline: 988   Patient verbalizes understanding of instructions and care plan provided today and agrees to view in [place]. Active MyChart status confirmed with patient.    No further follow up required: patient's daughter in law to call this Education officer, museum with any additional community resource needs  [employer], [place] [phone]

## 2021-11-06 NOTE — Telephone Encounter (Signed)
She fell yesterday morning. She thought she was in her grandchilds bedrooms and slid out of bed trying to get up by herself. [facility] came and got her up. No injury noted. [name] says patient has not been complaining. The NP from [facility] came out yesterday and did her assessment and will return in 6 weeks. Advised lee to let us know if they need anything.

## 2021-11-11 DIAGNOSIS — F39 Unspecified mood [affective] disorder: Secondary | ICD-10-CM | POA: Diagnosis not present

## 2021-11-11 DIAGNOSIS — F29 Unspecified psychosis not due to a substance or known physiological condition: Secondary | ICD-10-CM | POA: Diagnosis not present

## 2021-11-11 DIAGNOSIS — F5105 Insomnia due to other mental disorder: Secondary | ICD-10-CM | POA: Diagnosis not present

## 2021-11-12 ENCOUNTER — Emergency Department: Payer: Medicare Other

## 2021-11-12 ENCOUNTER — Other Ambulatory Visit: Payer: Self-pay

## 2021-11-12 ENCOUNTER — Encounter: Payer: Self-pay | Admitting: Internal Medicine

## 2021-11-12 ENCOUNTER — Emergency Department
Admission: EM | Admit: 2021-11-12 | Discharge: 2021-11-12 | Disposition: A | Discharge status: Home / Self Care | Payer: Medicare Other | Attending: Emergency Medicine | Admitting: Emergency Medicine

## 2021-11-12 DIAGNOSIS — R410 Disorientation, unspecified: Secondary | ICD-10-CM | POA: Diagnosis not present

## 2021-11-12 DIAGNOSIS — R52 Pain, unspecified: Secondary | ICD-10-CM | POA: Diagnosis not present

## 2021-11-12 DIAGNOSIS — S22080A Wedge compression fracture of T11-T12 vertebra, initial encounter for closed fracture: Secondary | ICD-10-CM | POA: Insufficient documentation

## 2021-11-12 DIAGNOSIS — M4316 Spondylolisthesis, lumbar region: Secondary | ICD-10-CM | POA: Diagnosis not present

## 2021-11-12 DIAGNOSIS — E119 Type 2 diabetes mellitus without complications: Secondary | ICD-10-CM | POA: Insufficient documentation

## 2021-11-12 DIAGNOSIS — M545 Low back pain, unspecified: Secondary | ICD-10-CM | POA: Diagnosis not present

## 2021-11-12 DIAGNOSIS — I2510 Atherosclerotic heart disease of native coronary artery without angina pectoris: Secondary | ICD-10-CM | POA: Insufficient documentation

## 2021-11-12 DIAGNOSIS — M549 Dorsalgia, unspecified: Secondary | ICD-10-CM | POA: Diagnosis not present

## 2021-11-12 DIAGNOSIS — W19XXXA Unspecified fall, initial encounter: Secondary | ICD-10-CM | POA: Diagnosis not present

## 2021-11-12 DIAGNOSIS — I700 Atherosclerosis of aorta: Secondary | ICD-10-CM | POA: Diagnosis not present

## 2021-11-12 DIAGNOSIS — Z7901 Long term (current) use of anticoagulants: Secondary | ICD-10-CM | POA: Insufficient documentation

## 2021-11-12 DIAGNOSIS — R109 Unspecified abdominal pain: Secondary | ICD-10-CM

## 2021-11-12 DIAGNOSIS — S299XXA Unspecified injury of thorax, initial encounter: Secondary | ICD-10-CM | POA: Diagnosis present

## 2021-11-12 DIAGNOSIS — I4891 Unspecified atrial fibrillation: Secondary | ICD-10-CM | POA: Diagnosis not present

## 2021-11-12 DIAGNOSIS — R8281 Pyuria: Secondary | ICD-10-CM | POA: Diagnosis not present

## 2021-11-12 DIAGNOSIS — M47816 Spondylosis without myelopathy or radiculopathy, lumbar region: Secondary | ICD-10-CM | POA: Diagnosis not present

## 2021-11-12 LAB — BASIC METABOLIC PANEL
Anion gap: 6 (ref 5–15)
BUN: 21 mg/dL (ref 8–23)
CO2: 27 mmol/L (ref 22–32)
Calcium: 8.9 mg/dL (ref 8.9–10.3)
Chloride: 105 mmol/L (ref 98–111)
Creatinine, Ser: 0.62 mg/dL (ref 0.44–1.00)
GFR, Estimated: >60 mL/min (ref >60)
Glucose, Bld: 96 mg/dL (ref 70–99)
Potassium: 4 mmol/L (ref 3.5–5.1)
Sodium: 138 mmol/L (ref 135–145)

## 2021-11-12 LAB — URINALYSIS, ROUTINE W REFLEX MICROSCOPIC
Bacteria, UA: NONE SEEN
Bilirubin Urine: NEGATIVE
Glucose, UA: NEGATIVE mg/dL
Hgb urine dipstick: NEGATIVE
Ketones, ur: NEGATIVE mg/dL
Nitrite: NEGATIVE
Protein, ur: NEGATIVE mg/dL
Specific Gravity, Urine: 1.011 (ref 1.005–1.030)
pH: 8 (ref 5.0–8.0)

## 2021-11-12 LAB — CBC WITH DIFFERENTIAL/PLATELET
Abs Immature Granulocytes: 0.02 10*3/uL (ref 0.00–0.07)
Basophils Absolute: 0 10*3/uL (ref 0.0–0.1)
Basophils Relative: 0 %
Eosinophils Absolute: 0 10*3/uL (ref 0.0–0.5)
Eosinophils Relative: 1 %
HCT: 33.3 % — ABNORMAL LOW (ref 36.0–46.0)
Hemoglobin: 10.4 g/dL — ABNORMAL LOW (ref 12.0–15.0)
Immature Granulocytes: 0 %
Lymphocytes Relative: 20 %
Lymphs Abs: 1.3 10*3/uL (ref 0.7–4.0)
MCH: 30 pg (ref 26.0–34.0)
MCHC: 31.2 g/dL (ref 30.0–36.0)
MCV: 96 fL (ref 80.0–100.0)
Monocytes Absolute: 0.7 10*3/uL (ref 0.1–1.0)
Monocytes Relative: 10 %
Neutro Abs: 4.4 10*3/uL (ref 1.7–7.7)
Neutrophils Relative %: 69 %
Platelets: 176 10*3/uL (ref 150–400)
RBC: 3.47 MIL/uL — ABNORMAL LOW (ref 3.87–5.11)
RDW: 15.5 % (ref 11.5–15.5)
WBC: 6.5 10*3/uL (ref 4.0–10.5)
nRBC: 0 % (ref 0.0–0.2)

## 2021-11-12 MED ORDER — LIDOCAINE 5 % EX PTCH: 1.0000 | MEDICATED_PATCH | Freq: Two times a day (BID) | CUTANEOUS | 0 refills | Status: DC

## 2021-11-12 MED ORDER — FOSFOMYCIN TROMETHAMINE 3 G PO PACK
3.0000 g | PACK | Freq: Once | ORAL | Status: AC
  Administered 2021-11-12: 3 g via ORAL
  Filled 2021-11-12: qty 3

## 2021-11-12 MED ORDER — ACETAMINOPHEN 500 MG PO TABS
1000.0000 mg | ORAL_TABLET | Freq: Once | ORAL | Status: AC
  Administered 2021-11-12: 1000 mg via ORAL
  Filled 2021-11-12: qty 2

## 2021-11-12 MED ORDER — LIDOCAINE 5 % EX PTCH
1.0000 | MEDICATED_PATCH | CUTANEOUS | Status: DC
  Administered 2021-11-12: 1 via TRANSDERMAL
  Filled 2021-11-12: qty 1

## 2021-11-12 NOTE — ED Notes (Signed)
Patient assisted to car via wheelchair by [place], [place]. ?

## 2021-11-12 NOTE — ED Provider Notes (Addendum)
[facility] Provider Note    Event Date/Time   First MD Initiated Contact with Patient 11/12/21 [phone]     (approximate)   History   Back Pain and Abdominal Pain   HPI  Holly Rose is a 88 y.o. female who presents to the ED for evaluation of Back Pain and Abdominal Pain   I reviewed PCP visit from 12/20.  History of A-fib on Eliquis, CAD DM, HLD. Resides at a local independent living facility.  Patient presents to the ED for evaluation of left-sided back, flank and abdominal pain for the past 1 week.  She reportedly fell at her facility 1 week ago, security had to help her get up at that time but she was not evaluated.   Patient is somewhat confused and disoriented, reportedly at her baseline with dementia.  Much of the history is provided by patient's daughter-in-law who arrives shortly after the patient.  Husband of the patient and called the daughter-in-law because patient could not stand up from a chair today because she was feeling so much left-sided abdominal and back pain.  She has been ambulatory since her fall, but has had increasing pain to her left side over the past few days.  Physical Exam   Triage Vital Signs: ED Triage Vitals  Enc Vitals Group     BP      Pulse      Resp      Temp      Temp src      SpO2      Weight      Height      Head Circumference      Peak Flow      Pain Score      Pain Loc      Pain Edu?      Excl. in [place]?     Most recent vital signs: Vitals:   11/12/21 0438 11/12/21 0656  BP: (!) 149/80 124/68  Pulse: 82 85  Resp: 16 13  Temp: 98.1 F (36.7 C)   SpO2: 100% 97%    General: Awake, no distress.  Pleasant and disoriented to time and location. CV:  Good peripheral perfusion.  Resp:  Normal effort.  Abd:  No distention.  Mild diffuse lower abdominal and left-sided abdominal tenderness without peritoneal features. No signs of trauma to the back or flank. MSK:  No deformity noted.  No signs of  trauma no spinal step-offs or midline tenderness Neuro:  No focal deficits appreciated. Cranial nerves II through XII intact 5/5 strength and sensation in all 4 extremities Other:     ED Results / Procedures / Treatments   Labs (all labs ordered are listed, but only abnormal results are displayed) Labs Reviewed  URINALYSIS, ROUTINE W REFLEX MICROSCOPIC - Abnormal; Notable for the following components:      Result Value   Color, Urine YELLOW (*)    APPearance HAZY (*)    Leukocytes,Ua MODERATE (*)    All other components within normal limits  CBC WITH DIFFERENTIAL/PLATELET - Abnormal; Notable for the following components:   RBC 3.47 (*)    Hemoglobin 10.4 (*)    HCT 33.3 (*)    All other components within normal limits  URINE CULTURE  BASIC METABOLIC PANEL    EKG   RADIOLOGY Plain film of the lumbar back reviewed by me without evidence of fracture or dislocation  Official radiology report(s): CT ABDOMEN PELVIS WO CONTRAST  Result Date: 11/12/2021 CLINICAL  DATA:  88 year old female with low back, left lower quadrant and flank pain after a fall 2 weeks ago. EXAM: CT ABDOMEN AND PELVIS WITHOUT CONTRAST TECHNIQUE: Multidetector CT imaging of the abdomen and pelvis was performed following the standard protocol without IV contrast. RADIATION DOSE REDUCTION: This exam was performed according to the departmental dose-optimization program which includes automated exposure control, adjustment of the mA and/or kV according to patient size and/or use of iterative reconstruction technique. COMPARISON:  CT Abdomen and Pelvis 12/25/2020. FINDINGS: Lower chest: Small pleural effusions, greater on the right and not significantly changed from last year. Cardiac pacemaker leads. Mild cardiomegaly. No pericardial effusion. Mild lung base atelectasis and scarring without consolidation. Hepatobiliary: Diminutive or absent gallbladder now. Negative noncontrast liver. Pancreas: Pancreatic inflammation and  peripancreatic fluid have resolved. Spleen: Trace residual perisplenic fluid, otherwise negative. Adrenals/Urinary Tract: Negative. No nephrolithiasis or convincing renal obstruction. Numerous pelvic phleboliths. Mostly decompressed bladder today. Stomach/Bowel: Redundant distal large bowel with retained stool and sigmoid diverticulosis in the pelvis. No definite active inflammation. Small volume free fluid in the left gutter, but decreased from last year. Redundant transverse colon with retained stool. Appendix not identified, could be diminutive or absent. Decompressed terminal ileum. No dilated small bowel. Decompressed stomach and duodenum. No free air. Vascular/Lymphatic: Aortoiliac calcified atherosclerosis. Normal caliber abdominal aorta. Vascular patency is not evaluated in the absence of IV contrast. No lymphadenopathy identified. Reproductive: Surgically absent uterus, diminutive or absent ovaries. Other: Virtually resolved pelvic free fluid from last year. Musculoskeletal: Osteopenia. T12 superior endplate compression fracture with 20-25% central loss of vertebral body height and superior endplate deformity (series 6, image 55), new from the CT last year. No retropulsion. T12 pedicles and posterior elements appear intact. Visible T10, T11 levels, and lumbar vertebrae appear intact. Lumbar spine appears stable with lower lumbar facet arthropathy. No other No acute osseous abnormality identified. IMPRESSION: 1. T12 superior endplate compression fracture is new from last year and suspicious for subacute injury in this setting. 20-25% loss of vertebral body height with no retropulsion or complicating features. 2. No other acute or inflammatory process identified in the noncontrast abdomen or pelvis. - mostly resolved sequelae of pancreatitis since last year, with a small volume of residual perisplenic and left pericolic gutter fluid. - small chronic pleural effusions, greater on the right. - Aortic  atherosclerosis (ICD10-I70.0). Electronically Signed   By: Genevie Ann M.D.   On: 11/12/2021 07:01   DG Lumbar Spine Complete  Result Date: 11/12/2021 CLINICAL DATA:  88 year old female with low back, left lower quadrant and flank pain after a fall 2 weeks ago. EXAM: LUMBAR SPINE - COMPLETE 4+ VIEW COMPARISON:  CT Abdomen and Pelvis today. FINDINGS: Normal lumbar segmentation. T12 superior endplate compression, better detailed by CT reported separately. Relatively preserved lumbar lordosis. Mild grade 1 anterolisthesis of L4 on L5 with moderate facet arthropathy at L4-L5 and L5-S1. no pars fracture. Grossly intact visible sacrum and SI joints. Relatively preserved lumbar disc spaces. Calcified aortic atherosclerosis. Nonobstructed visible bowel gas pattern with retained stool. IMPRESSION: 1. T12 compression fracture, see CT Abdomen and Pelvis reported separately. 2. No acute osseous abnormality identified in the lumbar spine. 3. Lower lumbar facet arthropathy in the setting of grade 1 anterolisthesis at L4-L5. Electronically Signed   By: Genevie Ann M.D.   On: 11/12/2021 07:03    PROCEDURES and INTERVENTIONS:  Procedures  Medications  fosfomycin (MONUROL) packet 3 g (has no administration in time range)  lidocaine (LIDODERM) 5 % 1 patch (has no  administration in time range)  acetaminophen (TYLENOL) tablet 1,000 mg (1,000 mg Oral Given 11/12/21 0529)     IMPRESSION / MDM / ASSESSMENT AND PLAN / ED COURSE  I reviewed the triage vital signs and the nursing notes.  Pleasantly demented 88 year old woman presents to the ED with few days of left-sided abdominal and back pain after a fall that occurred 1 week ago.  She looks well to me without distress, neurologic or vascular deficits.  No clear signs of trauma.  Has vague and poorly localizing tenderness throughout her left-sided abdomen, flank and back.  Blood work returning reassuring without leukocytosis or significant hemoglobin drop.  Normocytic anemia around  baseline.  Awaiting imaging reads, urine and remainder of blood work at the time of signout to oncoming provider.  Concern for acute urinary tract infection, ureterolithiasis, lumbar fracture or SBO.  Imaging returns just prior to signout.  Her urine shows pyuria concerning for infection.  Difficult to ascertain symptoms considering her disorientation.  We will send this for culture and give her a one-time dose of fosfomycin. Imaging of the abdomen and back with evidence of a T12 compression deformity.  I offered her bracing to the patient, but they declined considering her confusion and disorientation.  This is reasonable.  Her pain is controlled and I am not sure that she would tolerate the brace very well.  Clinical Course as of 11/12/21 1610  Wed Nov 12, 2021  0536 I chat with daughter-in-law at the bedside and reassessed the patient [DS]  [place].  Pain is controlled.  We discussed T12 compression deformity.  We discussed UA with pyuria and possibility of UTI.  We discussed fosfomycin.  I offered bracing for her back, but patient and daughter-in-law declines considering her dementia and confusion, which think is reasonable considering how well her pain is controlled. [DS]    Clinical Course User Index [DS] Vladimir Crofts, MD     FINAL CLINICAL IMPRESSION(S) / ED DIAGNOSES   Final diagnoses:  Left sided abdominal pain  Compression fracture of T12 vertebra, initial encounter ([place])  Pyuria     Rx / DC Orders   ED Discharge Orders          Ordered    lidocaine (LIDODERM) 5 %  Every 12 hours        11/12/21 0711             Note:  This document was prepared using Dragon voice recognition software and may include unintentional dictation errors.   Vladimir Crofts, MD 11/12/21 [place], [place], MD 11/12/21 [phone]

## 2021-11-12 NOTE — ED Notes (Signed)
Patient to xray.

## 2021-11-12 NOTE — ED Triage Notes (Signed)
Pt to ED by EMS from home with c/o lower back pain and LLQ abdominal pain. Pt endorses fall 2 weeks ago. Arrives A+O to baseline, VSS, NADN. ?

## 2021-11-12 NOTE — ED Notes (Signed)
Pt to CT

## 2021-11-12 NOTE — Telephone Encounter (Signed)
Please notify [name], I do not mind seeing in rx for lidocaine patches, but salonpas is over the counter and is probably going to be cheaper.  Let me know if I need to send in rx.  ?

## 2021-11-12 NOTE — Discharge Instructions (Addendum)
Use Tylenol for pain and fevers.  Up to 1000 mg per dose, up to 4 times per day.  Do not take more than 4000 mg of Tylenol/acetaminophen within 24 hours.. ? ?Please use lidocaine patches at your site of pain.  Apply 1 patch at a time, leave on for 12 hours, then remove for 12 hours.  12 hours on, 12 hours off.  Do not apply more than 1 patch at a time. ? ?Her urine test showed the possibility of a bladder infection.  This was sent for a culture and she was given a one-time dose of fosfomycin antibiotic to treat this.  Does not require any further antibiotics at this time. ? ?She has a compression fracture to her T12 vertebrae.  Use above pain medications. ?

## 2021-11-12 NOTE — ED Notes (Signed)
Patient daughter signed paper copy of discharge due to pin pad not working. ?

## 2021-11-14 LAB — URINE CULTURE: Culture: >=100000 — AB

## 2021-11-19 ENCOUNTER — Ambulatory Visit: Payer: Medicare Other | Admitting: Podiatry

## 2021-11-19 DIAGNOSIS — Z23 Encounter for immunization: Secondary | ICD-10-CM | POA: Diagnosis not present

## 2021-11-24 ENCOUNTER — Encounter: Payer: Self-pay | Admitting: Podiatry

## 2021-11-24 ENCOUNTER — Other Ambulatory Visit: Payer: Self-pay

## 2021-11-24 ENCOUNTER — Encounter: Payer: Self-pay | Admitting: Internal Medicine

## 2021-11-24 ENCOUNTER — Ambulatory Visit (INDEPENDENT_AMBULATORY_CARE_PROVIDER_SITE_OTHER): Payer: Medicare Other | Admitting: Podiatry

## 2021-11-24 DIAGNOSIS — D2371 Other benign neoplasm of skin of right lower limb, including hip: Secondary | ICD-10-CM | POA: Diagnosis not present

## 2021-11-24 DIAGNOSIS — E1142 Type 2 diabetes mellitus with diabetic polyneuropathy: Secondary | ICD-10-CM | POA: Diagnosis not present

## 2021-11-24 DIAGNOSIS — D2372 Other benign neoplasm of skin of left lower limb, including hip: Secondary | ICD-10-CM

## 2021-11-24 DIAGNOSIS — M79609 Pain in unspecified limb: Secondary | ICD-10-CM | POA: Diagnosis not present

## 2021-11-24 DIAGNOSIS — L03032 Cellulitis of left toe: Secondary | ICD-10-CM

## 2021-11-24 DIAGNOSIS — L97524 Non-pressure chronic ulcer of other part of left foot with necrosis of bone: Secondary | ICD-10-CM | POA: Diagnosis not present

## 2021-11-24 DIAGNOSIS — L02612 Cutaneous abscess of left foot: Secondary | ICD-10-CM | POA: Diagnosis not present

## 2021-11-24 DIAGNOSIS — B351 Tinea unguium: Secondary | ICD-10-CM | POA: Diagnosis not present

## 2021-11-24 NOTE — Progress Notes (Signed)
She presents today with her daughter-in-law for follow-up.  States that the antibiotics started to give her diarrhea so they discontinue the antibiotics completely.  At this point she states that she is starting to develop some purulence around the toe.  They continue to dress it on a daily or every other day basis.  Her nails are long and painful. ? ?Objective: Pulses remain palpable bilateral.  Her nails are thick yellow dystrophic clinically mycotic multiple reactive hyperkeratotic lesions once debrided demonstrate no purulence with the exception of the one of the distal stump of the second toe left foot.  There was some thick purulence today which I think sample and sent for culture and sensitivity and Gram stain. ? ?Assessment: Pain in limb secondary to onychomycosis and chronic osteomyelitis second toe proximal phalanx left foot. ? ?Plan: I debrided her nails today 1 through 5 right and 1 4 and 5 left.  I redressed the foot today and I corresponded with Dr. Sherryle Lis requesting that he perform a partial phalangectomy of the proximal phalanx.  He will get back to me and we will discuss this for a in office procedure. ?

## 2021-11-25 DIAGNOSIS — I495 Sick sinus syndrome: Secondary | ICD-10-CM | POA: Diagnosis not present

## 2021-11-25 DIAGNOSIS — R600 Localized edema: Secondary | ICD-10-CM | POA: Diagnosis not present

## 2021-11-25 DIAGNOSIS — I4811 Longstanding persistent atrial fibrillation: Secondary | ICD-10-CM | POA: Diagnosis not present

## 2021-11-25 DIAGNOSIS — I341 Nonrheumatic mitral (valve) prolapse: Secondary | ICD-10-CM | POA: Diagnosis not present

## 2021-11-25 DIAGNOSIS — E7800 Pure hypercholesterolemia, unspecified: Secondary | ICD-10-CM | POA: Diagnosis not present

## 2021-11-25 DIAGNOSIS — E1169 Type 2 diabetes mellitus with other specified complication: Secondary | ICD-10-CM | POA: Diagnosis not present

## 2021-11-25 DIAGNOSIS — E1140 Type 2 diabetes mellitus with diabetic neuropathy, unspecified: Secondary | ICD-10-CM | POA: Diagnosis not present

## 2021-11-25 DIAGNOSIS — E785 Hyperlipidemia, unspecified: Secondary | ICD-10-CM | POA: Diagnosis not present

## 2021-11-25 DIAGNOSIS — I25110 Atherosclerotic heart disease of native coronary artery with unstable angina pectoris: Secondary | ICD-10-CM | POA: Diagnosis not present

## 2021-11-25 NOTE — Telephone Encounter (Signed)
Please call.  Per note, states sugar this am 161, but readings have been in low 100.  If just one reading or just a few readings, then would hold on restarting and follow sugars.  Keep me posted on how they are doing.   ?

## 2021-11-26 NOTE — Telephone Encounter (Signed)
It was just the one sugar reading. Will continue to hold metformin. Patient is doing well has f/u next week ?

## 2021-11-27 LAB — WOUND CULTURE

## 2021-11-30 ENCOUNTER — Encounter: Payer: Self-pay | Admitting: Internal Medicine

## 2021-12-01 ENCOUNTER — Ambulatory Visit (INDEPENDENT_AMBULATORY_CARE_PROVIDER_SITE_OTHER): Payer: Medicare Other | Admitting: Internal Medicine

## 2021-12-01 ENCOUNTER — Other Ambulatory Visit: Payer: Self-pay

## 2021-12-01 ENCOUNTER — Other Ambulatory Visit: Payer: Self-pay | Admitting: *Deleted

## 2021-12-01 DIAGNOSIS — I25119 Atherosclerotic heart disease of native coronary artery with unspecified angina pectoris: Secondary | ICD-10-CM

## 2021-12-01 DIAGNOSIS — I4821 Permanent atrial fibrillation: Secondary | ICD-10-CM

## 2021-12-01 DIAGNOSIS — J439 Emphysema, unspecified: Secondary | ICD-10-CM

## 2021-12-01 DIAGNOSIS — I700 Atherosclerosis of aorta: Secondary | ICD-10-CM | POA: Diagnosis not present

## 2021-12-01 DIAGNOSIS — E785 Hyperlipidemia, unspecified: Secondary | ICD-10-CM | POA: Diagnosis not present

## 2021-12-01 DIAGNOSIS — M810 Age-related osteoporosis without current pathological fracture: Secondary | ICD-10-CM

## 2021-12-01 DIAGNOSIS — F439 Reaction to severe stress, unspecified: Secondary | ICD-10-CM

## 2021-12-01 DIAGNOSIS — C50912 Malignant neoplasm of unspecified site of left female breast: Secondary | ICD-10-CM

## 2021-12-01 DIAGNOSIS — F320 Major depressive disorder, single episode, mild: Secondary | ICD-10-CM | POA: Diagnosis not present

## 2021-12-01 DIAGNOSIS — E1140 Type 2 diabetes mellitus with diabetic neuropathy, unspecified: Secondary | ICD-10-CM | POA: Diagnosis not present

## 2021-12-01 DIAGNOSIS — D509 Iron deficiency anemia, unspecified: Secondary | ICD-10-CM

## 2021-12-01 DIAGNOSIS — Z170 Estrogen receptor positive status [ER+]: Secondary | ICD-10-CM

## 2021-12-01 NOTE — Progress Notes (Signed)
Patient ID: Holly Rose, female   DOB: Dec 24, 1929, 88 y.o.   MRN: [id number] ? ? ?Subjective:  ? ? Patient ID: Holly Rose, female    DOB: 1929/10/10, 88 y.o.   MRN: [id number] ? ?This visit occurred during the SARS-CoV-2 public health emergency.  Safety protocols were in place, including screening questions prior to the visit, additional usage of staff PPE, and extensive cleaning of exam room while observing appropriate contact time as indicated for disinfecting solutions.  ? ?Patient here for follow up appt  .  ? ?HPI ?She is accompanied by her daughter-n-law.  History obtained from both of them.  Here to follow up regarding her blood sugars and blood pressure.  Established with palliative care.  This is going well.  Sitters.  Denies chest pain or sob.  No increased cough or congestion.  Eating.  Caretakers make breakfast.  No abdominal pain or bowel change reported.  Sees Dr Ubaldo Glassing.  Evaluated 11/25/21 - stable.  Pacemaker.  Eliquis.  Fall last week.  No head injury.  Doing well.  No residual problems.  Overall feels good. Seeing Dr Nicolasa Ducking.  ? ? ?Past Medical History:  ?Diagnosis Date  ? Allergy   ? Anemia   ? Arthritis   ? Atrial fibrillation ([place]) 2004  ? Breast cancer, left ([place]) 10/2017  ? Lumpectomy and rad tx's.   ? CAD (coronary artery disease)   ? Complication of anesthesia   ? hard to wake up with general anesthesia  ? Diabetes ([place])   ? Dysrhythmia   ? A-fib  ? Heart attack [facility]) 2008  ? Heart murmur   ? History of kidney stones   ? History of sick sinus syndrome   ? s/p pacemaker placement  ? Hyperlipidemia   ? Macular degeneration   ? Neuropathy   ? Neuropathy   ? Pacemaker 06/2010  ? Personal history of radiation therapy 11/2017  ? LEFT lumpectomy   ? Squamous cell skin cancer   ? TIA (transient ischemic attack)   ? ?Past Surgical History:  ?Procedure Laterality Date  ? ABDOMINAL HYSTERECTOMY  1960's  ? APPENDECTOMY  1947  ? BLADDER SURGERY    ? 1975 and 1995  ? BREAST BIOPSY Left 12/17/2016  ?  SINGLE FRAGMENT OF ATYPICAL EPITHELIAL CELLS  ? BREAST BIOPSY Left 04/08/2017  ? [place]  ? BREAST EXCISIONAL BIOPSY Left 01/18/1986  ? Benign microcalcifications, [employer].  ? BREAST LUMPECTOMY Left 05/26/2017  ? 13 mm,T1c, N0; ER/ PR+, Her 2 neu negative, HIGH RISK by Mammoprint ;  Surgeon: [name], MD;  Location: [facility] ORS;  Service: General;  Laterality: Left;  ? [place]  ? CARDIAC CATHETERIZATION  1989  ? CATARACT EXTRACTION  1997 and 1998  ? COLONOSCOPY WITH PROPOFOL N/A 09/12/2015  ? Procedure: COLONOSCOPY WITH PROPOFOL;  Surgeon: [name], MD;  Location: [facility] ENDOSCOPY;  Service: Endoscopy;  Laterality: N/A;  ? ERCP N/A 02/21/2021  ? Procedure: ENDOSCOPIC RETROGRADE CHOLANGIOPANCREATOGRAPHY (ERCP);  Surgeon: Lucilla Lame, MD;  Location: [facility] ENDOSCOPY;  Service: Endoscopy;  Laterality: N/A;  ? IMPLANTABLE CARDIOVERTER DEFIBRILLATOR (ICD) GENERATOR CHANGE Left 03/29/2019  ? Procedure: PACEMAKER CHANGE OUT;  Surgeon: [name], MD;  Location: [facility] ORS;  Service: Cardiovascular;  Laterality: Left;  ? KIDNEY STONE SURGERY  2018  ? MASS EXCISION Left 02/24/2021  ? Procedure: EXCISION MASS SCALP;  Surgeon: [name], MD;  Location: [facility] ORS;  Service: General;  Laterality: Left;  ? MOHS SURGERY  2019  ? forehead  ? pace maker  2008  ? [place]  ? TONSILLECTOMY AND ADENOIDECTOMY  1950"s  ? ?Family History  ?Problem Relation Age of Onset  ? Stroke Mother   ? Diabetes Mother   ? Cancer Maternal Aunt   ?     Breast Cancer  ? Breast cancer Maternal Aunt   ? Arthritis Maternal Grandmother   ? Cancer Maternal Aunt   ?     Lung Cancer  ? Diabetes Son   ? Cancer Son   ? Kidney disease Neg Hx   ? Bladder Cancer Neg Hx   ? ?Social History  ? ?Socioeconomic History  ? Marital status: Married  ?  Spouse name: Holly Rose  ? Number of children: 2  ? Years of education: Not on file  ? Highest education level: Not on file  ?Occupational History  ? Not on file  ?Tobacco Use   ? Smoking status: Former  ?  Years: 5.00  ?  Types: Cigarettes  ?  Quit date: 09/14/1968  ?  Years since quitting: 53.2  ? Smokeless tobacco: Never  ? Tobacco comments:  ?  quit 1970  ?Vaping Use  ? Vaping Use: Never used  ?Substance and Sexual Activity  ? Alcohol use: No  ?  Alcohol/week: 0.0 standard drinks  ? Drug use: No  ? Sexual activity: Not on file  ?Other Topics Concern  ? Not on file  ?Social History Narrative  ? Live home with husband in [place]  ? ?Social Determinants of Health  ? ?Financial Resource Strain: Low Risk   ? Difficulty of Paying Living Expenses: Not hard at all  ?Food Insecurity: No Food Insecurity  ? Worried About Charity fundraiser in the Last Year: Never true  ? Ran Out of Food in the Last Year: Never true  ?Transportation Needs: No Transportation Needs  ? Lack of Transportation (Medical): No  ? Lack of Transportation (Non-Medical): No  ?Physical Activity: Insufficiently Active  ? Days of Exercise per Week: 2 days  ? Minutes of Exercise per Session: 20 min  ?Stress: No Stress Concern Present  ? Feeling of Stress : Not at all  ?Social Connections: Moderately Integrated  ? Frequency of Communication with Friends and Family: Three times a week  ? Frequency of Social Gatherings with Friends and Family: Twice a week  ? Attends Religious Services: More than 4 times per year  ? Active Member of Clubs or Organizations: No  ? Attends Archivist Meetings: Never  ? Marital Status: Married  ? ? ? ?Review of Systems  ?Constitutional:  Negative for appetite change and unexpected weight change.  ?HENT:  Negative for congestion and sinus pressure.   ?Respiratory:  Negative for cough, chest tightness and shortness of breath.   ?Cardiovascular:  Negative for chest pain, palpitations and leg swelling.  ?Gastrointestinal:  Negative for abdominal pain, diarrhea, nausea and vomiting.  ?Genitourinary:  Negative for difficulty urinating and dysuria.  ?Musculoskeletal:  Negative for joint  swelling and myalgias.  ?Skin:  Negative for color change and rash.  ?Neurological:  Negative for dizziness, light-headedness and headaches.  ?Psychiatric/Behavioral:  Negative for agitation and dysphoric mood.   ? ?   ?Objective:  ?  ? ?BP 104/70   Pulse 84   Temp 97.9 ?F (36.6 ?C)   Resp 16   Ht '5\' 2"'$  (1.575 m)   Wt 115 lb 6.4 oz (52.3 kg)   LMP  (  LMP Unknown)   SpO2 98%   BMI 21.11 kg/m?  ?Wt Readings from Last 3 Encounters:  ?12/01/21 115 lb 6.4 oz (52.3 kg)  ?11/12/21 110 lb 3.7 oz (50 kg)  ?09/02/21 109 lb 9.6 oz (49.7 kg)  ? ? ?Physical Exam ?Vitals reviewed.  ?Constitutional:   ?   General: She is not in acute distress. ?   Appearance: Normal appearance.  ?HENT:  ?   Head: Normocephalic and atraumatic.  ?   Right Ear: External ear normal.  ?   Left Ear: External ear normal.  ?Eyes:  ?   General: No scleral icterus.    ?   Right eye: No discharge.     ?   Left eye: No discharge.  ?   Conjunctiva/sclera: Conjunctivae normal.  ?Neck:  ?   Thyroid: No thyromegaly.  ?Cardiovascular:  ?   Rate and Rhythm: Normal rate and regular rhythm.  ?Pulmonary:  ?   Effort: No respiratory distress.  ?   Breath sounds: Normal breath sounds. No wheezing.  ?Abdominal:  ?   General: Bowel sounds are normal.  ?   Palpations: Abdomen is soft.  ?   Tenderness: There is no abdominal tenderness.  ?Musculoskeletal:     ?   General: No swelling or tenderness.  ?   Cervical back: Neck supple. No tenderness.  ?Lymphadenopathy:  ?   Cervical: No cervical adenopathy.  ?Skin: ?   Findings: No erythema or rash.  ?Neurological:  ?   Mental Status: She is alert.  ?Psychiatric:     ?   Mood and Affect: Mood normal.     ?   Behavior: Behavior normal.  ? ? ? ?Outpatient Encounter Medications as of 12/01/2021  ?Medication Sig  ? acetaminophen (TYLENOL) 325 MG tablet Take 2 tablets (650 mg total) by mouth every 6 (six) hours as needed for mild pain (or Fever >/= 101).  ? Aflibercept 2 MG/0.05ML SOLN 1 each by Intravitreal route every 8  (eight) weeks.  ? apixaban (ELIQUIS) 2.5 MG TABS tablet Take 1 tablet (2.5 mg total) by mouth 2 (two) times daily.  ? atorvastatin (LIPITOR) 40 MG tablet Take 1 tablet (40 mg total) by mouth daily.  ? atorvastatin (LIPI

## 2021-12-02 DIAGNOSIS — H353221 Exudative age-related macular degeneration, left eye, with active choroidal neovascularization: Secondary | ICD-10-CM | POA: Diagnosis not present

## 2021-12-05 DIAGNOSIS — Z20822 Contact with and (suspected) exposure to covid-19: Secondary | ICD-10-CM | POA: Diagnosis not present

## 2021-12-07 ENCOUNTER — Encounter: Payer: Self-pay | Admitting: Internal Medicine

## 2021-12-07 ENCOUNTER — Telehealth: Payer: Self-pay | Admitting: Internal Medicine

## 2021-12-07 NOTE — Assessment & Plan Note (Signed)
Changes c/w emphysema - noted on CT.  Breathing stable.  No increased cough or congestion.  No sob.  Follow.  ?

## 2021-12-07 NOTE — Assessment & Plan Note (Signed)
Follow cbc and iron studies.  

## 2021-12-07 NOTE — Assessment & Plan Note (Signed)
Continue isosorbide, metoprolol and lipitor.  Currently without chest pain.  Follow.   ?

## 2021-12-07 NOTE — Assessment & Plan Note (Signed)
Has good support.  Seeing Dr Nicolasa Ducking.  ?

## 2021-12-07 NOTE — Assessment & Plan Note (Signed)
Being followed by Dr Nicolasa Ducking.  Continues on remeron and depakote.  ?

## 2021-12-07 NOTE — Assessment & Plan Note (Signed)
Continue eliquis and metoprolol.  Stable.  Follow.  ?

## 2021-12-07 NOTE — Telephone Encounter (Signed)
Please schedule fasting labs in 3-4 weeks.  Looks like it was not scheduled at check out.  I have placed orders for labs.  ?

## 2021-12-07 NOTE — Assessment & Plan Note (Signed)
Continue lipitor.  Follow lipid panel and liver function tests.   

## 2021-12-07 NOTE — Assessment & Plan Note (Signed)
Continue lipitor  ?

## 2021-12-07 NOTE — Assessment & Plan Note (Addendum)
Continue metformin.   Eating.  Follow met b and a1c.  ?

## 2021-12-07 NOTE — Assessment & Plan Note (Signed)
S/p XRT. Continue tamoxifen.  ?

## 2021-12-08 ENCOUNTER — Other Ambulatory Visit: Payer: Medicare Other

## 2021-12-08 ENCOUNTER — Inpatient Hospital Stay: Payer: Medicare Other | Attending: Oncology

## 2021-12-08 ENCOUNTER — Other Ambulatory Visit: Payer: Self-pay

## 2021-12-08 DIAGNOSIS — C50412 Malignant neoplasm of upper-outer quadrant of left female breast: Secondary | ICD-10-CM | POA: Insufficient documentation

## 2021-12-08 DIAGNOSIS — D509 Iron deficiency anemia, unspecified: Secondary | ICD-10-CM

## 2021-12-08 DIAGNOSIS — Z170 Estrogen receptor positive status [ER+]: Secondary | ICD-10-CM | POA: Diagnosis not present

## 2021-12-08 DIAGNOSIS — M810 Age-related osteoporosis without current pathological fracture: Secondary | ICD-10-CM

## 2021-12-08 LAB — CBC WITH DIFFERENTIAL/PLATELET
Abs Immature Granulocytes: 0.01 10*3/uL (ref 0.00–0.07)
Basophils Absolute: 0 10*3/uL (ref 0.0–0.1)
Basophils Relative: 0 %
Eosinophils Absolute: 0.1 10*3/uL (ref 0.0–0.5)
Eosinophils Relative: 2 %
HCT: 32.9 % — ABNORMAL LOW (ref 36.0–46.0)
Hemoglobin: 10.4 g/dL — ABNORMAL LOW (ref 12.0–15.0)
Immature Granulocytes: 0 %
Lymphocytes Relative: 34 %
Lymphs Abs: 1.5 10*3/uL (ref 0.7–4.0)
MCH: 30.2 pg (ref 26.0–34.0)
MCHC: 31.6 g/dL (ref 30.0–36.0)
MCV: 95.6 fL (ref 80.0–100.0)
Monocytes Absolute: 0.3 10*3/uL (ref 0.1–1.0)
Monocytes Relative: 7 %
Neutro Abs: 2.5 10*3/uL (ref 1.7–7.7)
Neutrophils Relative %: 57 %
Platelets: 136 10*3/uL — ABNORMAL LOW (ref 150–400)
RBC: 3.44 MIL/uL — ABNORMAL LOW (ref 3.87–5.11)
RDW: 15.9 % — ABNORMAL HIGH (ref 11.5–15.5)
WBC: 4.4 10*3/uL (ref 4.0–10.5)
nRBC: 0 % (ref 0.0–0.2)

## 2021-12-08 LAB — COMPREHENSIVE METABOLIC PANEL
ALT: 13 U/L (ref 0–44)
AST: 21 U/L (ref 15–41)
Albumin: 2.8 g/dL — ABNORMAL LOW (ref 3.5–5.0)
Alkaline Phosphatase: 36 U/L — ABNORMAL LOW (ref 38–126)
Anion gap: 6 (ref 5–15)
BUN: 25 mg/dL — ABNORMAL HIGH (ref 8–23)
CO2: 26 mmol/L (ref 22–32)
Calcium: 9 mg/dL (ref 8.9–10.3)
Chloride: 103 mmol/L (ref 98–111)
Creatinine, Ser: 0.64 mg/dL (ref 0.44–1.00)
GFR, Estimated: >60 mL/min (ref >60)
Glucose, Bld: 146 mg/dL — ABNORMAL HIGH (ref 70–99)
Potassium: 3.7 mmol/L (ref 3.5–5.1)
Sodium: 135 mmol/L (ref 135–145)
Total Bilirubin: 0.2 mg/dL — ABNORMAL LOW (ref 0.3–1.2)
Total Protein: 7.1 g/dL (ref 6.5–8.1)

## 2021-12-08 LAB — FERRITIN: Ferritin: 14 ng/mL (ref 11–307)

## 2021-12-09 ENCOUNTER — Ambulatory Visit: Payer: Medicare Other | Admitting: Oncology

## 2021-12-09 ENCOUNTER — Ambulatory Visit: Payer: Medicare Other

## 2021-12-09 ENCOUNTER — Other Ambulatory Visit: Payer: Medicare Other

## 2021-12-09 DIAGNOSIS — R059 Cough, unspecified: Secondary | ICD-10-CM | POA: Diagnosis not present

## 2021-12-09 DIAGNOSIS — Z20822 Contact with and (suspected) exposure to covid-19: Secondary | ICD-10-CM | POA: Diagnosis not present

## 2021-12-09 DIAGNOSIS — R051 Acute cough: Secondary | ICD-10-CM | POA: Diagnosis not present

## 2021-12-14 NOTE — Progress Notes (Signed)
?Holly Rose  ?[phone] [phone] ? ?ID: [name] OB: 1930/08/21  MR#: [id number]  WGN#:[id number] ? ?Patient Care Team: ?[name], MD as PCP - General (Internal Medicine) ?[name], MD (Internal Medicine) ?[name], MD (General Surgery) ?[name], MD as Consulting Physician (Oncology) ?[place], [place] as Education officer, museum ? 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 ? ?INTERVAL HISTORY: Patient returns to clinic today for further evaluation and consideration of additional Venofer.  Her daughter reports that her dementia is getting worse and they family plans to move her to a memory care unit later this week.  She otherwise feels well.  She is tolerating tamoxifen without significant side effects. She has no neurologic complaints. She denies any recent fevers or illnesses. She has a good appetite and denies weight loss.  She denies any chest pain, shortness of breath, cough, or hemoptysis.  She denies any nausea, vomiting, constipation, or diarrhea. She has no urinary complaints.  Patient offers no specific complaints today. ? ?REVIEW OF SYSTEMS:   ?Review of Systems  ?Constitutional: Negative.  Negative for fever, malaise/fatigue and weight loss.  ?Respiratory: Negative.  Negative for cough and shortness of breath.   ?Cardiovascular: Negative.  Negative for chest pain and leg swelling.  ?Gastrointestinal: Negative.  Negative for abdominal pain, blood in stool and melena.  ?Genitourinary: Negative.  Negative for dysuria.  ?Musculoskeletal: Negative.  Negative for back pain.  ?Skin: Negative.  Negative for rash.  ?Neurological: Negative.  Negative for sensory change, focal weakness, weakness and headaches.  ?Psychiatric/Behavioral:  Positive for memory loss. The patient is not nervous/anxious.   ? ?As per HPI. Otherwise, a complete  review of systems is negative. ? ?PAST MEDICAL HISTORY: ?Past Medical History:  ?Diagnosis Date  ? Allergy   ? Anemia   ? Arthritis   ? Atrial fibrillation ([place]) 2004  ? Breast cancer, left ([place]) 10/2017  ? Lumpectomy and rad tx's.   ? CAD (coronary artery disease)   ? Complication of anesthesia   ? hard to wake up with general anesthesia  ? Diabetes ([place])   ? Dysrhythmia   ? A-fib  ? Heart attack [facility]) 2008  ? Heart murmur   ? History of kidney stones   ? History of sick sinus syndrome   ? s/p pacemaker placement  ? Hyperlipidemia   ? Macular degeneration   ? Neuropathy   ? Neuropathy   ? Pacemaker 06/2010  ? Personal history of radiation therapy 11/2017  ? LEFT lumpectomy   ? Squamous cell skin cancer   ? TIA (transient ischemic attack)   ? ? ?PAST SURGICAL HISTORY: ?Past Surgical History:  ?Procedure Laterality Date  ? ABDOMINAL HYSTERECTOMY  1960's  ? APPENDECTOMY  1947  ? BLADDER SURGERY    ? 1975 and 1995  ? BREAST BIOPSY Left 12/17/2016  ? SINGLE FRAGMENT OF ATYPICAL EPITHELIAL CELLS  ? BREAST BIOPSY Left 04/08/2017  ? [place]  ? BREAST EXCISIONAL BIOPSY Left 01/18/1986  ? Benign microcalcifications, [employer].  ? BREAST LUMPECTOMY Left 05/26/2017  ? 13 mm,T1c, N0; ER/ PR+, Her 2 neu negative, HIGH RISK by Mammoprint ;  Surgeon: [name], MD;  Location: [facility] ORS;  Service: General;  Laterality: Left;  ? [place]  ? CARDIAC CATHETERIZATION  1989  ? CATARACT EXTRACTION  1997 and 1998  ? COLONOSCOPY WITH PROPOFOL N/A 09/12/2015  ?  Procedure: COLONOSCOPY WITH PROPOFOL;  Surgeon: [name], MD;  Location: [facility] ENDOSCOPY;  Service: Endoscopy;  Laterality: N/A;  ? ERCP N/A 02/21/2021  ? Procedure: ENDOSCOPIC RETROGRADE CHOLANGIOPANCREATOGRAPHY (ERCP);  Surgeon: Lucilla Lame, MD;  Location: [facility] ENDOSCOPY;  Service: Endoscopy;  Laterality: N/A;  ? IMPLANTABLE CARDIOVERTER DEFIBRILLATOR (ICD) GENERATOR CHANGE Left 03/29/2019  ? Procedure: PACEMAKER CHANGE OUT;  Surgeon: [name], MD;  Location: [facility] ORS;  Service: Cardiovascular;  Laterality: Left;  ? KIDNEY STONE SURGERY  2018  ? MASS EXCISION Left 02/24/2021  ? Procedure: EXCISION MASS SCALP;  Surgeon: [name], MD;  Location: [facility] ORS;  Service: General;  Laterality: Left;  ? MOHS SURGERY  2019  ? forehead  ? pace maker  2008  ? [place]  ? TONSILLECTOMY AND ADENOIDECTOMY  1950"s  ? ? ?FAMILY HISTORY: ?Family History  ?Problem Relation Age of Onset  ? Stroke Mother   ? Diabetes Mother   ? Cancer Maternal Aunt   ?     Breast Cancer  ? Breast cancer Maternal Aunt   ? Arthritis Maternal Grandmother   ? Cancer Maternal Aunt   ?     Lung Cancer  ? Diabetes Son   ? Cancer Son   ? Kidney disease Neg Hx   ? Bladder Cancer Neg Hx   ? ? ?ADVANCED DIRECTIVES (Y/N):  N ? ?HEALTH MAINTENANCE: ?Social History  ? ?Tobacco Use  ? Smoking status: Former  ?  Years: 5.00  ?  Types: Cigarettes  ?  Quit date: 09/14/1968  ?  Years since quitting: 53.2  ? Smokeless tobacco: Never  ? Tobacco comments:  ?  quit 1970  ?Vaping Use  ? Vaping Use: Never used  ?Substance Use Topics  ? Alcohol use: No  ?  Alcohol/week: 0.0 standard drinks  ? Drug use: No  ? ? ? Colonoscopy: ? PAP: ? Bone density: ? Lipid panel: ? ?Allergies  ?Allergen Reactions  ? Lyrica [Pregabalin] Swelling  ?  Sleepy, does not eat  ? Neurontin [Gabapentin] Nausea And Vomiting and Other (See Comments)  ?  disoriented  ? Bactrim [Sulfamethoxazole-Trimethoprim]   ?  Made her dizziness  ? ? ?Current Outpatient Medications  ?Medication Sig Dispense Refill  ? acetaminophen (TYLENOL) 325 MG tablet Take 2 tablets (650 mg total) by mouth every 6 (six) hours as needed for mild pain (or Fever >/= 101).    ? Aflibercept 2 MG/0.05ML SOLN 1 each by Intravitreal route every 8 (eight) weeks.    ? apixaban (ELIQUIS) 2.5 MG TABS tablet Take 1 tablet (2.5 mg total) by mouth 2 (two) times daily. 60 tablet 1  ? atorvastatin (LIPITOR) 40 MG tablet Take 1 tablet (40 mg total) by mouth  daily. 30 tablet 0  ? atorvastatin (LIPITOR) 40 MG tablet Take 1 tablet by mouth daily.    ? blood glucose meter kit and supplies Per patient please dispense One touch Ultra2 meter.Use meter and supplies three times a day to check blood sugar. ICD10: E11.40 1 each 0  ? Blood Glucose Monitoring Suppl (FREESTYLE LITE) DEVI 2 (two) times daily.    ? Cholecalciferol (VITAMIN D) 50 MCG (2000 UT) CAPS Take 4,000 Units by mouth daily.    ? denosumab (PROLIA) 60 MG/ML SOSY injection Inject 60 mg into the skin every 6 (six) months.    ? divalproex (DEPAKOTE ER) 250 MG 24 hr tablet Take 250 mg by mouth daily.    ? divalproex (DEPAKOTE ER) 500  MG 24 hr tablet Take 500 mg by mouth daily.    ? furosemide (LASIX) 20 MG tablet Take 1 tablet (20 mg total) by mouth daily.    ? glucose blood (FREESTYLE LITE) test strip Used to check blood sugars twice a day. DX E11.9 100 each 12  ? isosorbide mononitrate (IMDUR) 30 MG 24 hr tablet Take 1 tablet (30 mg total) by mouth daily. 30 tablet 0  ? Lancets (FREESTYLE) lancets 2 (two) times daily.    ? lidocaine (LIDODERM) 5 % Place 1 patch onto the skin every 12 (twelve) hours. Remove & Discard patch within 12 hours or as directed by MD 10 patch 0  ? meclizine (ANTIVERT) 12.5 MG tablet Take 1 tablet (12.5 mg total) by mouth 2 (two) times daily as needed for dizziness. 14 tablet 0  ? metoprolol tartrate (LOPRESSOR) 25 MG tablet Take 25 mg by mouth 2 (two) times daily.    ? mirtazapine (REMERON) 15 MG tablet Take 15 mg by mouth at bedtime.    ? Multiple Vitamins-Minerals (PRESERVISION AREDS 2 PO) Take 1 tablet by mouth 2 (two) times daily.     ? mupirocin ointment (BACTROBAN) 2 % Apply 1 application topically 2 (two) times daily. 22 g 0  ? Polyethyl Glycol-Propyl Glycol (SYSTANE OP) Apply 1 drop to eye 2 (two) times daily.    ? Probiotic Product (PROBIOTIC DAILY PO) Take by mouth.    ? tamoxifen (NOLVADEX) 20 MG tablet Take 1 tablet by mouth once daily 90 tablet 3  ? vitamin B-12  (CYANOCOBALAMIN) 500 MCG tablet Take 500 mcg by mouth 2 (two) times a day.    ? ?No current facility-administered medications for this visit.  ? ? ?OBJECTIVE: ?Vitals:  ? 12/16/21 1258  ?BP: 106/63  ?Pulse: 78  ?Resp: 16  ?Temp: (!) 9

## 2021-12-15 ENCOUNTER — Encounter: Payer: Self-pay | Admitting: Oncology

## 2021-12-15 ENCOUNTER — Encounter: Payer: Self-pay | Admitting: Internal Medicine

## 2021-12-15 NOTE — Progress Notes (Signed)
Pt may have to move to memory care or 24 hour care. Pt left facility walking and no one noticed. Not sure if she is taking her dementia meds. ?

## 2021-12-16 ENCOUNTER — Inpatient Hospital Stay: Payer: Medicare Other

## 2021-12-16 ENCOUNTER — Inpatient Hospital Stay: Payer: Medicare Other | Attending: Oncology | Admitting: Oncology

## 2021-12-16 ENCOUNTER — Other Ambulatory Visit: Payer: Self-pay

## 2021-12-16 VITALS — BP 106/63 | HR 78 | Temp 96.7°F | Resp 16 | Ht 62.0 in | Wt 116.5 lb

## 2021-12-16 VITALS — BP 121/72 | HR 74

## 2021-12-16 DIAGNOSIS — Z8673 Personal history of transient ischemic attack (TIA), and cerebral infarction without residual deficits: Secondary | ICD-10-CM | POA: Diagnosis not present

## 2021-12-16 DIAGNOSIS — Z803 Family history of malignant neoplasm of breast: Secondary | ICD-10-CM | POA: Diagnosis not present

## 2021-12-16 DIAGNOSIS — Z79810 Long term (current) use of selective estrogen receptor modulators (SERMs): Secondary | ICD-10-CM | POA: Insufficient documentation

## 2021-12-16 DIAGNOSIS — Z87891 Personal history of nicotine dependence: Secondary | ICD-10-CM | POA: Diagnosis not present

## 2021-12-16 DIAGNOSIS — M810 Age-related osteoporosis without current pathological fracture: Secondary | ICD-10-CM

## 2021-12-16 DIAGNOSIS — Z8052 Family history of malignant neoplasm of bladder: Secondary | ICD-10-CM | POA: Insufficient documentation

## 2021-12-16 DIAGNOSIS — I2510 Atherosclerotic heart disease of native coronary artery without angina pectoris: Secondary | ICD-10-CM | POA: Diagnosis not present

## 2021-12-16 DIAGNOSIS — D509 Iron deficiency anemia, unspecified: Secondary | ICD-10-CM | POA: Diagnosis not present

## 2021-12-16 DIAGNOSIS — I4891 Unspecified atrial fibrillation: Secondary | ICD-10-CM | POA: Insufficient documentation

## 2021-12-16 DIAGNOSIS — E119 Type 2 diabetes mellitus without complications: Secondary | ICD-10-CM | POA: Diagnosis not present

## 2021-12-16 DIAGNOSIS — C50412 Malignant neoplasm of upper-outer quadrant of left female breast: Secondary | ICD-10-CM | POA: Diagnosis not present

## 2021-12-16 DIAGNOSIS — I252 Old myocardial infarction: Secondary | ICD-10-CM | POA: Diagnosis not present

## 2021-12-16 DIAGNOSIS — Z170 Estrogen receptor positive status [ER+]: Secondary | ICD-10-CM | POA: Diagnosis not present

## 2021-12-16 DIAGNOSIS — F0390 Unspecified dementia without behavioral disturbance: Secondary | ICD-10-CM | POA: Diagnosis not present

## 2021-12-16 DIAGNOSIS — E785 Hyperlipidemia, unspecified: Secondary | ICD-10-CM | POA: Insufficient documentation

## 2021-12-16 DIAGNOSIS — Z7901 Long term (current) use of anticoagulants: Secondary | ICD-10-CM | POA: Insufficient documentation

## 2021-12-16 DIAGNOSIS — Z801 Family history of malignant neoplasm of trachea, bronchus and lung: Secondary | ICD-10-CM | POA: Diagnosis not present

## 2021-12-16 DIAGNOSIS — M129 Arthropathy, unspecified: Secondary | ICD-10-CM | POA: Insufficient documentation

## 2021-12-16 DIAGNOSIS — Z79899 Other long term (current) drug therapy: Secondary | ICD-10-CM | POA: Insufficient documentation

## 2021-12-16 DIAGNOSIS — D696 Thrombocytopenia, unspecified: Secondary | ICD-10-CM | POA: Diagnosis not present

## 2021-12-16 MED ORDER — IRON SUCROSE 20 MG/ML IV SOLN
200.0000 mg | Freq: Once | INTRAVENOUS | Status: AC
  Administered 2021-12-16: 200 mg via INTRAVENOUS
  Filled 2021-12-16: qty 10

## 2021-12-16 MED ORDER — SODIUM CHLORIDE 0.9 % IV SOLN: 200.0000 mg | Freq: Once | INTRAVENOUS | Status: DC

## 2021-12-16 MED ORDER — SODIUM CHLORIDE 0.9 % IV SOLN
Freq: Once | INTRAVENOUS | Status: AC
  Filled 2021-12-16: qty 250

## 2021-12-16 NOTE — Patient Instructions (Signed)

## 2021-12-16 NOTE — Telephone Encounter (Signed)
Will need form to complete ?

## 2021-12-16 NOTE — Telephone Encounter (Signed)
Signed and placed in box.   

## 2021-12-16 NOTE — Telephone Encounter (Signed)
Form completed. Meds reviewed with Holly Rose and updated med list attached to form. Will send after signature. ?

## 2021-12-17 ENCOUNTER — Other Ambulatory Visit: Payer: Medicare Other | Admitting: Student

## 2021-12-17 ENCOUNTER — Telehealth: Payer: Self-pay | Admitting: Internal Medicine

## 2021-12-17 DIAGNOSIS — F03911 Unspecified dementia, unspecified severity, with agitation: Secondary | ICD-10-CM | POA: Diagnosis not present

## 2021-12-17 DIAGNOSIS — K5900 Constipation, unspecified: Secondary | ICD-10-CM

## 2021-12-17 DIAGNOSIS — Z515 Encounter for palliative care: Secondary | ICD-10-CM

## 2021-12-17 DIAGNOSIS — L97529 Non-pressure chronic ulcer of other part of left foot with unspecified severity: Secondary | ICD-10-CM

## 2021-12-17 NOTE — Telephone Encounter (Signed)
Form faxed

## 2021-12-17 NOTE — Telephone Encounter (Signed)
Holly Rose has osteoporosis.  She has been getting prolia injections at the cancer center.  Last 08/12/21.  Given her underlying medication issues, she is no longer going to f/u at the cancer center.  I would like for her to start getting prolia injections here.  She will be due in May.  What do I need to do - to get this arranged?  Thanks   ?

## 2021-12-17 NOTE — Progress Notes (Signed)
? ? ?Manufacturing engineer ?Community Palliative Care Consult Note ?Telephone: [phone]  ?Fax: [phone]  ? ? ?Date of encounter: 12/17/21 ?10:19 AM ?PATIENT NAME: Holly Rose ?[address] ?[place] [address]   ?[phone] (home)  ?DOB: 1930/01/10 ?MRN: [id number] ?PRIMARY CARE PROVIDER:    ?[name], MD,  ?[address] ?[place] [address] ?[phone] ? ?REFERRING PROVIDER:   ?[name], MD ?[address] ?[address] ?[place],  [place] [address] ?[phone] ? ?RESPONSIBLE PARTY:    ?Contact Information   ? ? Name Relation Home Work Mobile  ? Blomquist,[name] Relative   [phone]  ? Jody, Aguinaga Spouse [phone]  [phone]  ? Newlin,Frankie Daughter   [phone]  ? Malachy Chamber   [phone]  ? ?  ? ? ? ?I met face to face with patient and family in the home. Palliative Care was asked to follow this patient by consultation request of  [name], MD to address advance care planning and complex medical decision making. This is a follow up visit. ? ?                                 ASSESSMENT AND PLAN / RECOMMENDATIONS:  ? ?Advance Care Planning/Goals of Care: Goals include to maximize quality of life and symptom management. Patient/health care surrogate gave his/her permission to discuss. ?Our advance care planning conversation included a discussion about:    ?The value and importance of advance care planning  ?Experiences with loved ones who have been seriously ill or have died  ?Exploration of personal, cultural or spiritual beliefs that might influence medical decisions  ?Exploration of goals of care in the event of a sudden injury or illness  ?Identification of a healthcare agent  ?Review and updating or creation of an  advance directive document . ?Decision not to resuscitate or to de-escalate disease focused treatments due to poor prognosis. ?CODE STATUS: DNR ? ?Pateint resides on [facility]; she recently wandered  out of her home. She will be moving to a memory unit tomorrow. Palliative Medicine will contineu to provide ongoing support.  ? ?Symptom Management/Plan: ? ?Dementia-patient with worsening confusion, wandered out of home. She will be moving to memory unit tomorrow. Continue Depakote and mirtazapine as directed. Will monitor for behaviors, adjustment to new environment. Monitor for falls/safety. ? ?Left foot wound-continue dressing change twice weekly. Follow up with wound care as directed. ? ?Constipation-start colace 100 mg daily. Encourage adequate fluids.  ? ? ?Follow up Palliative Care Visit: Palliative care will continue to follow for complex medical decision making, advance care planning, and clarification of goals. Return 4 weeks or prn. ? ? ?This visit was coded based on medical decision making (MDM). ? ?PPS: 50% ? ?HOSPICE ELIGIBILITY/DIAGNOSIS: TBD ? ?Chief Complaint: Palliative Medicine follow up visit.  ? ?HISTORY OF PRESENT ILLNESS:  [name] is a 88 y.o. year old female  with  with dementia, T2DM, depression, stroke, TIA, hallucinations, anemia, osteomyelitis of foot, breast CA-on tamoxifen, hx of NSTEMI.    ? ?Patient reports doing okay. Family reports increased confusion. She recently wandered out of the home. She will be moving to memory unit tomorrow. No worsening hallucinations reported. She had an iron infusion yesterday; she is to follow up with oncology PRN. Family continues to care for wounds to left great toe and 2nd great toe, dressing changed twice weekly. She continues to go to wound center routinely.  She is s/p fall out of bed in past 3 weeks; she also was found to have T12 compression fracture after falling in past 6 weeks. She denies pain, shortness of breath, nausea. She does report constipation. Endorses good appetite. She is not sleeping well per caregiver. Family and caregiver contribute to HPI and ROS due to her dementia. ? ?History obtained from review of EMR, discussion  with primary team, and interview with family, facility staff/caregiver and/or Ms. Holly Rose.  ?I reviewed available labs, medications, imaging, studies and related documents from the EMR.  Records reviewed and summarized above.  ? ? ?Physical Exam: ?Weight: 116 pounds ?Pulse 76, resp 16, b/p 122/60, sats 99% on room air ?Constitutional: NAD ?General: frail appearing, thin ?EYES: anicteric sclera, lids intact, no discharge  ?ENMT: intact hearing, oral mucous membranes moist, dentition intact ?CV: S1S2, RRR, no LE edema ?Pulmonary: LCTA, no increased work of breathing, no cough, room air ?Abdomen:  normo-active BS + 4 quadrants, soft and non tender, no ascites ?GU: deferred ?MSK: moves all extremities, ambulatory with walker ?Skin: warm and dry, no rashes, wound to left great toe and 2nd toe ?Neuro:  no generalized weakness, A & O to person, familiars, forgetful ?Psych: non-anxious affect, pleasant ?Hem/lymph/immuno: no widespread bruising ? ? ?Thank you for the opportunity to participate in the care of Ms. Holly Rose.  The palliative care team will continue to follow. Please call our office at [phone] if we can be of additional assistance.  ? ?Ezekiel Slocumb, NP  ? ?COVID-19 PATIENT SCREENING TOOL ?Asked and negative response unless otherwise noted:  ? ?Have you had symptoms of covid, tested positive or been in contact with someone with symptoms/positive test in the past 5-10 days? No ? ?

## 2021-12-18 NOTE — Telephone Encounter (Signed)
[employer] Approval submitted. ?

## 2021-12-22 DIAGNOSIS — R7611 Nonspecific reaction to tuberculin skin test without active tuberculosis: Secondary | ICD-10-CM | POA: Diagnosis not present

## 2021-12-23 DIAGNOSIS — Z170 Estrogen receptor positive status [ER+]: Secondary | ICD-10-CM | POA: Diagnosis not present

## 2021-12-23 DIAGNOSIS — F03918 Unspecified dementia, unspecified severity, with other behavioral disturbance: Secondary | ICD-10-CM | POA: Diagnosis not present

## 2021-12-23 DIAGNOSIS — R7303 Prediabetes: Secondary | ICD-10-CM | POA: Diagnosis not present

## 2021-12-23 DIAGNOSIS — I4811 Longstanding persistent atrial fibrillation: Secondary | ICD-10-CM | POA: Diagnosis not present

## 2021-12-23 DIAGNOSIS — N183 Chronic kidney disease, stage 3 unspecified: Secondary | ICD-10-CM | POA: Diagnosis not present

## 2021-12-23 DIAGNOSIS — E1122 Type 2 diabetes mellitus with diabetic chronic kidney disease: Secondary | ICD-10-CM | POA: Diagnosis not present

## 2021-12-23 DIAGNOSIS — Z23 Encounter for immunization: Secondary | ICD-10-CM | POA: Diagnosis not present

## 2021-12-23 DIAGNOSIS — M791 Myalgia, unspecified site: Secondary | ICD-10-CM | POA: Diagnosis not present

## 2021-12-23 DIAGNOSIS — C50412 Malignant neoplasm of upper-outer quadrant of left female breast: Secondary | ICD-10-CM | POA: Diagnosis not present

## 2021-12-23 DIAGNOSIS — R296 Repeated falls: Secondary | ICD-10-CM | POA: Diagnosis not present

## 2021-12-23 DIAGNOSIS — I25110 Atherosclerotic heart disease of native coronary artery with unstable angina pectoris: Secondary | ICD-10-CM | POA: Diagnosis not present

## 2021-12-23 DIAGNOSIS — T466X5A Adverse effect of antihyperlipidemic and antiarteriosclerotic drugs, initial encounter: Secondary | ICD-10-CM | POA: Diagnosis not present

## 2021-12-23 DIAGNOSIS — I495 Sick sinus syndrome: Secondary | ICD-10-CM | POA: Diagnosis not present

## 2021-12-30 ENCOUNTER — Other Ambulatory Visit: Payer: Medicare Other

## 2022-01-02 NOTE — Telephone Encounter (Signed)
Patient approved for [facility] but patient has been moved to Memory care at [facility] patient daughter advised she would be staying there and Dr. Ouida Sills has taken over her care. Daughter stated she would not be bringing her mother out of memory care. Daughter and family thankful for PCP care patient no longer able to function outside memory care per daughter. ?

## 2022-01-05 DIAGNOSIS — Z20822 Contact with and (suspected) exposure to covid-19: Secondary | ICD-10-CM | POA: Diagnosis not present

## 2022-01-06 ENCOUNTER — Telehealth: Payer: Self-pay | Admitting: *Deleted

## 2022-01-06 NOTE — Telephone Encounter (Signed)
Opened in error refer back to note from 12/16/21 ?

## 2022-01-09 DIAGNOSIS — R051 Acute cough: Secondary | ICD-10-CM | POA: Diagnosis not present

## 2022-01-09 DIAGNOSIS — R059 Cough, unspecified: Secondary | ICD-10-CM | POA: Diagnosis not present

## 2022-01-09 DIAGNOSIS — Z20822 Contact with and (suspected) exposure to covid-19: Secondary | ICD-10-CM | POA: Diagnosis not present

## 2022-01-13 DIAGNOSIS — Z20822 Contact with and (suspected) exposure to covid-19: Secondary | ICD-10-CM | POA: Diagnosis not present

## 2022-01-14 ENCOUNTER — Encounter: Payer: Self-pay | Admitting: Podiatry

## 2022-01-19 DIAGNOSIS — Z23 Encounter for immunization: Secondary | ICD-10-CM | POA: Diagnosis not present

## 2022-01-19 DIAGNOSIS — F0280 Dementia in other diseases classified elsewhere without behavioral disturbance: Secondary | ICD-10-CM | POA: Diagnosis not present

## 2022-01-19 DIAGNOSIS — G309 Alzheimer's disease, unspecified: Secondary | ICD-10-CM | POA: Diagnosis not present

## 2022-01-19 DIAGNOSIS — G20 Parkinson's disease: Secondary | ICD-10-CM | POA: Diagnosis not present

## 2022-01-29 DIAGNOSIS — F3340 Major depressive disorder, recurrent, in remission, unspecified: Secondary | ICD-10-CM | POA: Diagnosis not present

## 2022-01-29 DIAGNOSIS — F03918 Unspecified dementia, unspecified severity, with other behavioral disturbance: Secondary | ICD-10-CM | POA: Diagnosis not present

## 2022-02-03 ENCOUNTER — Other Ambulatory Visit: Payer: Self-pay

## 2022-02-03 ENCOUNTER — Emergency Department (EMERGENCY_DEPARTMENT_HOSPITAL)
Admission: EM | Admit: 2022-02-03 | Discharge: 2022-02-03 | Disposition: A | Discharge status: Home / Self Care | Payer: Medicare Other | Source: Home / Self Care | Attending: Student in an Organized Health Care Education/Training Program | Admitting: Student in an Organized Health Care Education/Training Program

## 2022-02-03 DIAGNOSIS — Z87891 Personal history of nicotine dependence: Secondary | ICD-10-CM | POA: Diagnosis not present

## 2022-02-03 DIAGNOSIS — I495 Sick sinus syndrome: Secondary | ICD-10-CM | POA: Diagnosis present

## 2022-02-03 DIAGNOSIS — Z923 Personal history of irradiation: Secondary | ICD-10-CM | POA: Diagnosis not present

## 2022-02-03 DIAGNOSIS — E785 Hyperlipidemia, unspecified: Secondary | ICD-10-CM | POA: Diagnosis not present

## 2022-02-03 DIAGNOSIS — A4151 Sepsis due to Escherichia coli [E. coli]: Secondary | ICD-10-CM | POA: Diagnosis not present

## 2022-02-03 DIAGNOSIS — F03918 Unspecified dementia, unspecified severity, with other behavioral disturbance: Secondary | ICD-10-CM | POA: Diagnosis present

## 2022-02-03 DIAGNOSIS — E872 Acidosis, unspecified: Secondary | ICD-10-CM | POA: Diagnosis not present

## 2022-02-03 DIAGNOSIS — Z90710 Acquired absence of both cervix and uterus: Secondary | ICD-10-CM | POA: Diagnosis not present

## 2022-02-03 DIAGNOSIS — Z66 Do not resuscitate: Secondary | ICD-10-CM | POA: Diagnosis not present

## 2022-02-03 DIAGNOSIS — R451 Restlessness and agitation: Secondary | ICD-10-CM | POA: Insufficient documentation

## 2022-02-03 DIAGNOSIS — I2510 Atherosclerotic heart disease of native coronary artery without angina pectoris: Secondary | ICD-10-CM | POA: Diagnosis present

## 2022-02-03 DIAGNOSIS — N390 Urinary tract infection, site not specified: Secondary | ICD-10-CM | POA: Diagnosis not present

## 2022-02-03 DIAGNOSIS — Z89422 Acquired absence of other left toe(s): Secondary | ICD-10-CM | POA: Diagnosis not present

## 2022-02-03 DIAGNOSIS — Z853 Personal history of malignant neoplasm of breast: Secondary | ICD-10-CM | POA: Diagnosis not present

## 2022-02-03 DIAGNOSIS — R404 Transient alteration of awareness: Secondary | ICD-10-CM | POA: Diagnosis not present

## 2022-02-03 DIAGNOSIS — E1169 Type 2 diabetes mellitus with other specified complication: Secondary | ICD-10-CM | POA: Diagnosis not present

## 2022-02-03 DIAGNOSIS — F03911 Unspecified dementia, unspecified severity, with agitation: Secondary | ICD-10-CM | POA: Diagnosis not present

## 2022-02-03 DIAGNOSIS — F05 Delirium due to known physiological condition: Secondary | ICD-10-CM | POA: Diagnosis not present

## 2022-02-03 DIAGNOSIS — Z1152 Encounter for screening for COVID-19: Secondary | ICD-10-CM | POA: Diagnosis not present

## 2022-02-03 DIAGNOSIS — R569 Unspecified convulsions: Secondary | ICD-10-CM | POA: Diagnosis not present

## 2022-02-03 DIAGNOSIS — I252 Old myocardial infarction: Secondary | ICD-10-CM | POA: Diagnosis not present

## 2022-02-03 DIAGNOSIS — I700 Atherosclerosis of aorta: Secondary | ICD-10-CM | POA: Diagnosis not present

## 2022-02-03 DIAGNOSIS — N3000 Acute cystitis without hematuria: Secondary | ICD-10-CM | POA: Diagnosis not present

## 2022-02-03 DIAGNOSIS — R456 Violent behavior: Secondary | ICD-10-CM | POA: Insufficient documentation

## 2022-02-03 DIAGNOSIS — I4891 Unspecified atrial fibrillation: Secondary | ICD-10-CM | POA: Diagnosis not present

## 2022-02-03 DIAGNOSIS — Z85828 Personal history of other malignant neoplasm of skin: Secondary | ICD-10-CM | POA: Diagnosis not present

## 2022-02-03 DIAGNOSIS — H3530 Unspecified macular degeneration: Secondary | ICD-10-CM | POA: Diagnosis present

## 2022-02-03 DIAGNOSIS — Z515 Encounter for palliative care: Secondary | ICD-10-CM | POA: Diagnosis not present

## 2022-02-03 DIAGNOSIS — Z950 Presence of cardiac pacemaker: Secondary | ICD-10-CM | POA: Diagnosis not present

## 2022-02-03 DIAGNOSIS — R4182 Altered mental status, unspecified: Secondary | ICD-10-CM | POA: Diagnosis not present

## 2022-02-03 DIAGNOSIS — R410 Disorientation, unspecified: Secondary | ICD-10-CM | POA: Diagnosis not present

## 2022-02-03 DIAGNOSIS — D696 Thrombocytopenia, unspecified: Secondary | ICD-10-CM | POA: Diagnosis not present

## 2022-02-03 DIAGNOSIS — Z8673 Personal history of transient ischemic attack (TIA), and cerebral infarction without residual deficits: Secondary | ICD-10-CM | POA: Diagnosis not present

## 2022-02-03 DIAGNOSIS — I499 Cardiac arrhythmia, unspecified: Secondary | ICD-10-CM | POA: Diagnosis not present

## 2022-02-03 DIAGNOSIS — D6959 Other secondary thrombocytopenia: Secondary | ICD-10-CM | POA: Diagnosis present

## 2022-02-03 DIAGNOSIS — Z87442 Personal history of urinary calculi: Secondary | ICD-10-CM | POA: Diagnosis not present

## 2022-02-03 DIAGNOSIS — R9431 Abnormal electrocardiogram [ECG] [EKG]: Secondary | ICD-10-CM | POA: Diagnosis not present

## 2022-02-03 DIAGNOSIS — A419 Sepsis, unspecified organism: Secondary | ICD-10-CM | POA: Diagnosis not present

## 2022-02-03 LAB — CBC
HCT: 35.5 % — ABNORMAL LOW (ref 36.0–46.0)
Hemoglobin: 10.8 g/dL — ABNORMAL LOW (ref 12.0–15.0)
MCH: 29.5 pg (ref 26.0–34.0)
MCHC: 30.4 g/dL (ref 30.0–36.0)
MCV: 97 fL (ref 80.0–100.0)
Platelets: 164 K/uL (ref 150–400)
RBC: 3.66 MIL/uL — ABNORMAL LOW (ref 3.87–5.11)
RDW: 16.4 % — ABNORMAL HIGH (ref 11.5–15.5)
WBC: 3.6 K/uL — ABNORMAL LOW (ref 4.0–10.5)
nRBC: 0 % (ref 0.0–0.2)

## 2022-02-03 LAB — COMPREHENSIVE METABOLIC PANEL
ALT: 16 U/L (ref 0–44)
AST: 26 U/L (ref 15–41)
Albumin: 2.7 g/dL — ABNORMAL LOW (ref 3.5–5.0)
Alkaline Phosphatase: 35 U/L — ABNORMAL LOW (ref 38–126)
Anion gap: 7 (ref 5–15)
BUN: 19 mg/dL (ref 8–23)
CO2: 22 mmol/L (ref 22–32)
Calcium: 9.1 mg/dL (ref 8.9–10.3)
Chloride: 109 mmol/L (ref 98–111)
Creatinine, Ser: 0.81 mg/dL (ref 0.44–1.00)
GFR, Estimated: >60 mL/min (ref >60)
Glucose, Bld: 189 mg/dL — ABNORMAL HIGH (ref 70–99)
Potassium: 4.1 mmol/L (ref 3.5–5.1)
Sodium: 138 mmol/L (ref 135–145)
Total Bilirubin: 0.5 mg/dL (ref 0.3–1.2)
Total Protein: 6.8 g/dL (ref 6.5–8.1)

## 2022-02-03 LAB — ETHANOL: Alcohol, Ethyl (B): <10 mg/dL (ref <10)

## 2022-02-03 LAB — ACETAMINOPHEN LEVEL: Acetaminophen (Tylenol), Serum: <10 ug/mL — ABNORMAL LOW (ref 10–30)

## 2022-02-03 LAB — SALICYLATE LEVEL: Salicylate Lvl: <7 mg/dL — ABNORMAL LOW (ref 7.0–30.0)

## 2022-02-03 LAB — VALPROIC ACID LEVEL: Valproic Acid Lvl: 35 ug/mL — ABNORMAL LOW (ref 50.0–100.0)

## 2022-02-03 NOTE — ED Notes (Signed)
Psychiatry at bedside.

## 2022-02-03 NOTE — ED Notes (Signed)
Dr. Izola Price at bedside at this time.

## 2022-02-03 NOTE — ED Notes (Signed)
Patient transferred to 19 hallway, no signs of distress at this time, daughter -in- law at the bedside.

## 2022-02-03 NOTE — ED Notes (Signed)
Pt's daughter- in -law requesting to be discharged and follow up with pt's PCP.  EDP made aware.
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
## 2022-02-03 NOTE — ED Notes (Signed)
Urine sample misplaced by lab.  New sample to be obtain for UA.
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
## 2022-02-03 NOTE — Discharge Instructions (Signed)
Recommend that Dr. Nicolasa Ducking or Dr. Ouida Sills be consulted regarding consideration for very small dose antipsychotic for agitation/sundowning. Recommend increasing Buspar to 10 mg twice daily.

## 2022-02-03 NOTE — ED Notes (Signed)
Patient up to the bathroom, no signs of distress, Patient assisted per w/c, Patient alert and oriented, Thanked nurse for assistance,. Will continue to monitor.,

## 2022-02-03 NOTE — ED Notes (Signed)
Patient's daughter-in-law voiced understanding of discharge instructions, Patient calm and cooperative, no complaints, left via car with all belongings.

## 2022-02-04 ENCOUNTER — Encounter: Payer: Self-pay | Admitting: Oncology

## 2022-02-05 DIAGNOSIS — R410 Disorientation, unspecified: Secondary | ICD-10-CM | POA: Diagnosis not present

## 2022-02-05 DIAGNOSIS — N390 Urinary tract infection, site not specified: Secondary | ICD-10-CM | POA: Diagnosis not present

## 2022-02-06 ENCOUNTER — Non-Acute Institutional Stay: Payer: Medicare Other | Admitting: Student

## 2022-02-06 ENCOUNTER — Inpatient Hospital Stay
Admission: EM | Admit: 2022-02-06 | Discharge: 2022-02-10 | DRG: 872 | Disposition: A | Discharge status: Skilled Nursing Facility | Payer: Medicare Other | Source: Skilled Nursing Facility | Attending: Internal Medicine | Admitting: Internal Medicine

## 2022-02-06 ENCOUNTER — Emergency Department: Payer: Medicare Other

## 2022-02-06 ENCOUNTER — Encounter: Payer: Self-pay | Admitting: Internal Medicine

## 2022-02-06 DIAGNOSIS — R410 Disorientation, unspecified: Secondary | ICD-10-CM

## 2022-02-06 DIAGNOSIS — Z803 Family history of malignant neoplasm of breast: Secondary | ICD-10-CM

## 2022-02-06 DIAGNOSIS — Z8673 Personal history of transient ischemic attack (TIA), and cerebral infarction without residual deficits: Secondary | ICD-10-CM

## 2022-02-06 DIAGNOSIS — Z66 Do not resuscitate: Secondary | ICD-10-CM | POA: Diagnosis present

## 2022-02-06 DIAGNOSIS — I495 Sick sinus syndrome: Secondary | ICD-10-CM | POA: Diagnosis present

## 2022-02-06 DIAGNOSIS — N3000 Acute cystitis without hematuria: Secondary | ICD-10-CM | POA: Diagnosis present

## 2022-02-06 DIAGNOSIS — I2510 Atherosclerotic heart disease of native coronary artery without angina pectoris: Secondary | ICD-10-CM | POA: Diagnosis present

## 2022-02-06 DIAGNOSIS — Z1152 Encounter for screening for COVID-19: Secondary | ICD-10-CM | POA: Diagnosis not present

## 2022-02-06 DIAGNOSIS — I4891 Unspecified atrial fibrillation: Secondary | ICD-10-CM | POA: Diagnosis present

## 2022-02-06 DIAGNOSIS — R4182 Altered mental status, unspecified: Secondary | ICD-10-CM | POA: Diagnosis not present

## 2022-02-06 DIAGNOSIS — Z823 Family history of stroke: Secondary | ICD-10-CM

## 2022-02-06 DIAGNOSIS — D6959 Other secondary thrombocytopenia: Secondary | ICD-10-CM | POA: Diagnosis present

## 2022-02-06 DIAGNOSIS — A419 Sepsis, unspecified organism: Secondary | ICD-10-CM | POA: Diagnosis present

## 2022-02-06 DIAGNOSIS — E785 Hyperlipidemia, unspecified: Secondary | ICD-10-CM | POA: Diagnosis present

## 2022-02-06 DIAGNOSIS — Z89422 Acquired absence of other left toe(s): Secondary | ICD-10-CM

## 2022-02-06 DIAGNOSIS — Z79899 Other long term (current) drug therapy: Secondary | ICD-10-CM

## 2022-02-06 DIAGNOSIS — Z515 Encounter for palliative care: Secondary | ICD-10-CM | POA: Diagnosis not present

## 2022-02-06 DIAGNOSIS — Z87891 Personal history of nicotine dependence: Secondary | ICD-10-CM | POA: Diagnosis not present

## 2022-02-06 DIAGNOSIS — A4151 Sepsis due to Escherichia coli [E. coli]: Secondary | ICD-10-CM | POA: Diagnosis present

## 2022-02-06 DIAGNOSIS — F03911 Unspecified dementia, unspecified severity, with agitation: Secondary | ICD-10-CM

## 2022-02-06 DIAGNOSIS — Z85828 Personal history of other malignant neoplasm of skin: Secondary | ICD-10-CM

## 2022-02-06 DIAGNOSIS — I252 Old myocardial infarction: Secondary | ICD-10-CM

## 2022-02-06 DIAGNOSIS — R569 Unspecified convulsions: Secondary | ICD-10-CM | POA: Diagnosis not present

## 2022-02-06 DIAGNOSIS — N390 Urinary tract infection, site not specified: Secondary | ICD-10-CM | POA: Diagnosis not present

## 2022-02-06 DIAGNOSIS — Z87442 Personal history of urinary calculi: Secondary | ICD-10-CM

## 2022-02-06 DIAGNOSIS — Z8249 Family history of ischemic heart disease and other diseases of the circulatory system: Secondary | ICD-10-CM

## 2022-02-06 DIAGNOSIS — E872 Acidosis, unspecified: Secondary | ICD-10-CM | POA: Diagnosis present

## 2022-02-06 DIAGNOSIS — Z923 Personal history of irradiation: Secondary | ICD-10-CM | POA: Diagnosis not present

## 2022-02-06 DIAGNOSIS — H3530 Unspecified macular degeneration: Secondary | ICD-10-CM | POA: Diagnosis present

## 2022-02-06 DIAGNOSIS — D696 Thrombocytopenia, unspecified: Secondary | ICD-10-CM

## 2022-02-06 DIAGNOSIS — F03918 Unspecified dementia, unspecified severity, with other behavioral disturbance: Secondary | ICD-10-CM | POA: Diagnosis not present

## 2022-02-06 DIAGNOSIS — E1169 Type 2 diabetes mellitus with other specified complication: Secondary | ICD-10-CM | POA: Diagnosis present

## 2022-02-06 DIAGNOSIS — Z90710 Acquired absence of both cervix and uterus: Secondary | ICD-10-CM | POA: Diagnosis not present

## 2022-02-06 DIAGNOSIS — Z882 Allergy status to sulfonamides status: Secondary | ICD-10-CM

## 2022-02-06 DIAGNOSIS — Z801 Family history of malignant neoplasm of trachea, bronchus and lung: Secondary | ICD-10-CM

## 2022-02-06 DIAGNOSIS — Z7901 Long term (current) use of anticoagulants: Secondary | ICD-10-CM

## 2022-02-06 DIAGNOSIS — Z833 Family history of diabetes mellitus: Secondary | ICD-10-CM

## 2022-02-06 DIAGNOSIS — Z950 Presence of cardiac pacemaker: Secondary | ICD-10-CM | POA: Diagnosis not present

## 2022-02-06 DIAGNOSIS — R404 Transient alteration of awareness: Secondary | ICD-10-CM | POA: Diagnosis not present

## 2022-02-06 DIAGNOSIS — F05 Delirium due to known physiological condition: Secondary | ICD-10-CM | POA: Diagnosis not present

## 2022-02-06 DIAGNOSIS — Z888 Allergy status to other drugs, medicaments and biological substances status: Secondary | ICD-10-CM

## 2022-02-06 DIAGNOSIS — Z853 Personal history of malignant neoplasm of breast: Secondary | ICD-10-CM

## 2022-02-06 DIAGNOSIS — I499 Cardiac arrhythmia, unspecified: Secondary | ICD-10-CM | POA: Diagnosis not present

## 2022-02-06 DIAGNOSIS — Z8261 Family history of arthritis: Secondary | ICD-10-CM

## 2022-02-06 DIAGNOSIS — I700 Atherosclerosis of aorta: Secondary | ICD-10-CM | POA: Diagnosis not present

## 2022-02-06 LAB — RESP PANEL BY RT-PCR (FLU A&B, COVID) ARPGX2
Influenza A by PCR: NEGATIVE
Influenza B by PCR: NEGATIVE
SARS Coronavirus 2 by RT PCR: NEGATIVE

## 2022-02-06 LAB — CBC WITH DIFFERENTIAL/PLATELET
Abs Immature Granulocytes: 0.01 10*3/uL (ref 0.00–0.07)
Basophils Absolute: 0 10*3/uL (ref 0.0–0.1)
Basophils Relative: 0 %
Eosinophils Absolute: 0 10*3/uL (ref 0.0–0.5)
Eosinophils Relative: 0 %
HCT: 35 % — ABNORMAL LOW (ref 36.0–46.0)
Hemoglobin: 11 g/dL — ABNORMAL LOW (ref 12.0–15.0)
Immature Granulocytes: 0 %
Lymphocytes Relative: 5 %
Lymphs Abs: 0.4 10*3/uL — ABNORMAL LOW (ref 0.7–4.0)
MCH: 29.6 pg (ref 26.0–34.0)
MCHC: 31.4 g/dL (ref 30.0–36.0)
MCV: 94.3 fL (ref 80.0–100.0)
Monocytes Absolute: 0.1 10*3/uL (ref 0.1–1.0)
Monocytes Relative: 2 %
Neutro Abs: 6.9 10*3/uL (ref 1.7–7.7)
Neutrophils Relative %: 93 %
Platelets: 179 10*3/uL (ref 150–400)
RBC: 3.71 MIL/uL — ABNORMAL LOW (ref 3.87–5.11)
RDW: 16.3 % — ABNORMAL HIGH (ref 11.5–15.5)
WBC: 7.5 10*3/uL (ref 4.0–10.5)
nRBC: 0 % (ref 0.0–0.2)

## 2022-02-06 LAB — COMPREHENSIVE METABOLIC PANEL
ALT: 15 U/L (ref 0–44)
AST: 27 U/L (ref 15–41)
Albumin: 2.8 g/dL — ABNORMAL LOW (ref 3.5–5.0)
Alkaline Phosphatase: 47 U/L (ref 38–126)
Anion gap: 10 (ref 5–15)
BUN: 23 mg/dL (ref 8–23)
CO2: 24 mmol/L (ref 22–32)
Calcium: 9.7 mg/dL (ref 8.9–10.3)
Chloride: 103 mmol/L (ref 98–111)
Creatinine, Ser: 0.8 mg/dL (ref 0.44–1.00)
GFR, Estimated: >60 mL/min (ref >60)
Glucose, Bld: 109 mg/dL — ABNORMAL HIGH (ref 70–99)
Potassium: 4.5 mmol/L (ref 3.5–5.1)
Sodium: 137 mmol/L (ref 135–145)
Total Bilirubin: 0.8 mg/dL (ref 0.3–1.2)
Total Protein: 7.3 g/dL (ref 6.5–8.1)

## 2022-02-06 LAB — LACTIC ACID, PLASMA
Lactic Acid, Venous: 2.8 mmol/L (ref 0.5–1.9)
Lactic Acid, Venous: 2.8 mmol/L (ref 0.5–1.9)

## 2022-02-06 LAB — GLUCOSE, CAPILLARY: Glucose-Capillary: 184 mg/dL — ABNORMAL HIGH (ref 70–99)

## 2022-02-06 LAB — TROPONIN I (HIGH SENSITIVITY)
Troponin I (High Sensitivity): 19 ng/L — ABNORMAL HIGH (ref <18)
Troponin I (High Sensitivity): 29 ng/L — ABNORMAL HIGH (ref <18)

## 2022-02-06 LAB — URINALYSIS, COMPLETE (UACMP) WITH MICROSCOPIC
Bilirubin Urine: NEGATIVE
Glucose, UA: NEGATIVE mg/dL
Hgb urine dipstick: NEGATIVE
Ketones, ur: NEGATIVE mg/dL
Nitrite: POSITIVE — AB
Protein, ur: 30 mg/dL — AB
Specific Gravity, Urine: 1.014 (ref 1.005–1.030)
Squamous Epithelial / HPF: NONE SEEN (ref 0–5)
WBC, UA: >50 WBC/hpf — ABNORMAL HIGH (ref 0–5)
pH: 6 (ref 5.0–8.0)

## 2022-02-06 LAB — APTT: aPTT: 27 seconds (ref 24–36)

## 2022-02-06 LAB — PROTIME-INR
INR: 1.3 — ABNORMAL HIGH (ref 0.8–1.2)
Prothrombin Time: 16.2 seconds — ABNORMAL HIGH (ref 11.4–15.2)

## 2022-02-06 LAB — BRAIN NATRIURETIC PEPTIDE: B Natriuretic Peptide: 221.6 pg/mL — ABNORMAL HIGH (ref 0.0–100.0)

## 2022-02-06 MED ORDER — ACETAMINOPHEN 325 MG RE SUPP
RECTAL | Status: AC
  Administered 2022-02-06: 650 mg via RECTAL
  Filled 2022-02-06: qty 3

## 2022-02-06 MED ORDER — LACTATED RINGERS IV SOLN: INTRAVENOUS | Status: DC

## 2022-02-06 MED ORDER — ACETAMINOPHEN 650 MG RE SUPP
650.0000 mg | Freq: Four times a day (QID) | RECTAL | Status: DC | PRN
Start: 2022-02-06 — End: 2022-02-10

## 2022-02-06 MED ORDER — SODIUM CHLORIDE 0.9 % IV SOLN
1.0000 g | INTRAVENOUS | Status: DC
  Administered 2022-02-06: 1 g via INTRAVENOUS
  Filled 2022-02-06: qty 10

## 2022-02-06 MED ORDER — LACTATED RINGERS IV BOLUS (SEPSIS)
1000.0000 mL | Freq: Once | INTRAVENOUS | Status: AC
  Administered 2022-02-06: 1000 mL via INTRAVENOUS

## 2022-02-06 MED ORDER — ATORVASTATIN CALCIUM 20 MG PO TABS
40.0000 mg | ORAL_TABLET | Freq: Every day | ORAL | Status: DC
  Administered 2022-02-07 – 2022-02-10 (×4): 40 mg via ORAL
  Filled 2022-02-06 (×4): qty 2

## 2022-02-06 MED ORDER — ACETAMINOPHEN 325 MG PO TABS
650.0000 mg | ORAL_TABLET | Freq: Four times a day (QID) | ORAL | Status: DC | PRN
  Administered 2022-02-07: 650 mg via ORAL
  Filled 2022-02-06: qty 2

## 2022-02-06 MED ORDER — DOCUSATE SODIUM 100 MG PO CAPS
100.0000 mg | ORAL_CAPSULE | Freq: Every day | ORAL | Status: DC
  Administered 2022-02-07 – 2022-02-10 (×4): 100 mg via ORAL
  Filled 2022-02-06 (×4): qty 1

## 2022-02-06 MED ORDER — VITAMIN D 25 MCG (1000 UNIT) PO TABS
2000.0000 [IU] | ORAL_TABLET | Freq: Every day | ORAL | Status: DC
  Administered 2022-02-07 – 2022-02-10 (×4): 2000 [IU] via ORAL
  Filled 2022-02-06 (×4): qty 2

## 2022-02-06 MED ORDER — RISAQUAD PO CAPS
ORAL_CAPSULE | Freq: Every day | ORAL | Status: DC
  Administered 2022-02-07 – 2022-02-10 (×4): 1 via ORAL
  Filled 2022-02-06 (×4): qty 1

## 2022-02-06 MED ORDER — APIXABAN 2.5 MG PO TABS
2.5000 mg | ORAL_TABLET | Freq: Two times a day (BID) | ORAL | Status: DC
  Administered 2022-02-06 – 2022-02-10 (×7): 2.5 mg via ORAL
  Filled 2022-02-06 (×8): qty 1

## 2022-02-06 MED ORDER — LORATADINE 10 MG PO TABS
10.0000 mg | ORAL_TABLET | Freq: Every day | ORAL | Status: DC
  Administered 2022-02-07 – 2022-02-10 (×4): 10 mg via ORAL
  Filled 2022-02-06 (×4): qty 1

## 2022-02-06 MED ORDER — VANCOMYCIN HCL IN DEXTROSE 1-5 GM/200ML-% IV SOLN
1000.0000 mg | Freq: Once | INTRAVENOUS | Status: AC
  Administered 2022-02-06: 1000 mg via INTRAVENOUS
  Filled 2022-02-06: qty 200

## 2022-02-06 MED ORDER — CYANOCOBALAMIN 500 MCG PO TABS
500.0000 ug | ORAL_TABLET | Freq: Every day | ORAL | Status: DC
  Administered 2022-02-07 – 2022-02-10 (×4): 500 ug via ORAL
  Filled 2022-02-06 (×4): qty 1

## 2022-02-06 MED ORDER — INSULIN ASPART 100 UNIT/ML IJ SOLN
0.0000 [IU] | Freq: Three times a day (TID) | INTRAMUSCULAR | Status: DC
  Filled 2022-02-06: qty 1

## 2022-02-06 MED ORDER — ONDANSETRON HCL 4 MG PO TABS: 4.0000 mg | ORAL_TABLET | Freq: Four times a day (QID) | ORAL | Status: DC | PRN

## 2022-02-06 MED ORDER — DIVALPROEX SODIUM ER 500 MG PO TB24
750.0000 mg | ORAL_TABLET | Freq: Every day | ORAL | Status: DC
  Administered 2022-02-06 – 2022-02-07 (×2): 750 mg via ORAL
  Filled 2022-02-06 (×2): qty 1

## 2022-02-06 MED ORDER — ACETAMINOPHEN 325 MG RE SUPP
650.0000 mg | Freq: Once | RECTAL | Status: AC
Start: 2022-02-06 — End: 2022-02-06

## 2022-02-06 MED ORDER — INSULIN ASPART 100 UNIT/ML IJ SOLN: 0.0000 [IU] | Freq: Every day | INTRAMUSCULAR | Status: DC

## 2022-02-06 MED ORDER — METOPROLOL TARTRATE 5 MG/5ML IV SOLN: 5.0000 mg | Freq: Once | INTRAVENOUS | Status: DC

## 2022-02-06 MED ORDER — SODIUM CHLORIDE 0.9 % IV SOLN
2.0000 g | Freq: Once | INTRAVENOUS | Status: AC
  Administered 2022-02-06: 2 g via INTRAVENOUS
  Filled 2022-02-06: qty 12.5

## 2022-02-06 MED ORDER — BUSPIRONE HCL 15 MG PO TABS
7.5000 mg | ORAL_TABLET | Freq: Two times a day (BID) | ORAL | Status: DC
  Administered 2022-02-06 – 2022-02-08 (×4): 7.5 mg via ORAL
  Filled 2022-02-06 (×4): qty 1

## 2022-02-06 MED ORDER — HALOPERIDOL LACTATE 5 MG/ML IJ SOLN
1.0000 mg | Freq: Four times a day (QID) | INTRAMUSCULAR | Status: DC | PRN
  Administered 2022-02-06 – 2022-02-09 (×6): 1 mg via INTRAVENOUS
  Filled 2022-02-06 (×7): qty 1

## 2022-02-06 MED ORDER — ONDANSETRON HCL 4 MG/2ML IJ SOLN: 4.0000 mg | Freq: Four times a day (QID) | INTRAMUSCULAR | Status: DC | PRN

## 2022-02-06 MED ORDER — MIRTAZAPINE 15 MG PO TABS
15.0000 mg | ORAL_TABLET | Freq: Every day | ORAL | Status: DC
  Administered 2022-02-06 – 2022-02-07 (×2): 15 mg via ORAL
  Filled 2022-02-06 (×2): qty 1

## 2022-02-06 MED ORDER — SODIUM CHLORIDE 0.9 % IV SOLN: 1.0000 g | INTRAVENOUS | Status: DC
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
## 2022-02-06 NOTE — H&P (Signed)
History and Physical    Patient: Holly Rose:[id number] DOB: Dec 15, 1929 DOA: 02/06/2022 DOS: the patient was seen and examined on 02/06/2022 PCP: Kirk Ruths, MD  Patient coming from: [facility]  Chief Complaint:  Chief Complaint  Patient presents with   Fever    Resident from [facility] at [place], sent here by staff for fever (103.5 rectal here) and increased confusion/agitation; Tachycardic in 140s upon arrival, patient is also very agitated and combative (history of dementia)   HPI: Holly Rose is a 88 y.o. female with medical history significant of dementia memory care, stroke, CAD, type 2 diabetes mellitus, pancreatitis, atrial fibrillation.  She presents to the hospital with fever of 103 elevated heart rate and some agitation.  Patient's family stated that she was shaking and she was rigid and complained of pain over at her memory care.  She has been having a strong odor on her urine for couple days.  She has been not feeling well for few days.  She does have 24/7 care at [facility]  In the ER she was found to have a high fever, positive urine analysis, tachycardia and lactic acidosis.  Hospitalist services were contacted for further evaluation.  Review of Systems: Review of Systems  Constitutional:  Positive for chills, fever and malaise/fatigue.  HENT:  Negative for congestion, hearing loss and sore throat.   Eyes:  Negative for blurred vision.  Respiratory:  Negative for cough and shortness of breath.   Cardiovascular:  Negative for chest pain.  Gastrointestinal:  Negative for abdominal pain, nausea and vomiting.  Genitourinary:  Negative for dysuria and hematuria.  Musculoskeletal:  Negative for myalgias.  Skin:  Negative for rash.  Neurological:  Negative for focal weakness.  Endo/Heme/Allergies:  Bruises/bleeds easily.  Psychiatric/Behavioral:  Positive for memory loss.    Past Medical History:  Diagnosis Date   Allergy     Anemia    Arthritis    Atrial fibrillation ([place]) 2004   Breast cancer, left ([place]) 10/2017   Lumpectomy and rad tx's.    CAD (coronary artery disease)    Complication of anesthesia    hard to wake up with general anesthesia   Diabetes ([place])    Dysrhythmia    A-fib   Heart attack ([place]) 2008   Heart murmur    History of kidney stones    History of sick sinus syndrome    s/p pacemaker placement   Hyperlipidemia    Macular degeneration    Neuropathy    Neuropathy    Pacemaker 06/2010   Personal history of radiation therapy 11/2017   LEFT lumpectomy    Squamous cell skin cancer    TIA (transient ischemic attack)    Past Surgical History:  Procedure Laterality Date   ABDOMINAL HYSTERECTOMY  1960's   [place] Left 12/17/2016   SINGLE FRAGMENT OF ATYPICAL EPITHELIAL CELLS   BREAST BIOPSY Left 04/08/2017   [facility]   BREAST EXCISIONAL BIOPSY Left 01/18/1986   Benign microcalcifications, [employer].   BREAST LUMPECTOMY Left 05/26/2017   13 mm,T1c, N0; ER/ PR+, Her 2 neu negative, HIGH RISK by [place] ;  Surgeon: [name], MD;  Location: [facility] ORS;  Service: General;  Laterality: Left;   [place] PROPOFOL N/A  09/12/2015   Procedure: COLONOSCOPY WITH PROPOFOL;  Surgeon: [name], MD;  Location: [facility] ENDOSCOPY;  Service: Endoscopy;  Laterality: N/A;   ERCP N/A 02/21/2021   Procedure: ENDOSCOPIC RETROGRADE CHOLANGIOPANCREATOGRAPHY (ERCP);  Surgeon: Lucilla Lame, MD;  Location: [facility] ENDOSCOPY;  Service: Endoscopy;  Laterality: N/A;   IMPLANTABLE CARDIOVERTER DEFIBRILLATOR (ICD) GENERATOR CHANGE Left 03/29/2019   Procedure: PACEMAKER CHANGE OUT;  Surgeon: [name], MD;  Location: [facility] ORS;  Service: Cardiovascular;  Laterality: Left;   KIDNEY STONE SURGERY  2018   MASS EXCISION Left  02/24/2021   Procedure: EXCISION MASS SCALP;  Surgeon: [name], MD;  Location: [facility] ORS;  Service: General;  Laterality: Left;   MOHS SURGERY  2019   forehead   pace maker  2008   partial toe amputaions Left    2nd and 3rd toes   [place] ADENOIDECTOMY  1950"s   Social History:  reports that she quit smoking about 53 years ago. Her smoking use included cigarettes. She has never used smokeless tobacco. She reports that she does not drink alcohol and does not use drugs.  Allergies  Allergen Reactions   Lyrica [Pregabalin] Swelling    Sleepy, does not eat   Neurontin [Gabapentin] Nausea And Vomiting and Other (See Comments)    disoriented   Bactrim [Sulfamethoxazole-Trimethoprim]     Made her dizziness    Family History  Problem Relation Age of Onset   Stroke Mother    Diabetes Mother    CAD Father    Lung cancer Brother    Cancer Brother    Arthritis Maternal Grandmother    Diabetes Son    Cancer Son    Cancer Maternal Aunt        Breast Cancer   Breast cancer Maternal Aunt    Cancer Maternal Aunt        Lung Cancer   Kidney disease Neg Hx    Bladder Cancer Neg Hx     Prior to Admission medications   Medication Sig Start Date End Date Taking? Authorizing Provider  acetaminophen (TYLENOL) 325 MG tablet Take 2 tablets (650 mg total) by mouth every 6 (six) hours as needed for mild pain (or Fever >/= 101). 12/28/20   Domenic Polite, MD  apixaban (ELIQUIS) 2.5 MG TABS tablet Take 1 tablet (2.5 mg total) by mouth 2 (two) times daily. 07/23/20   Lorella Nimrod, MD  atorvastatin (LIPITOR) 40 MG tablet Take 1 tablet (40 mg total) by mouth daily. 04/18/20   Loletha Grayer, MD  busPIRone (BUSPAR) 7.5 MG tablet Take 7.5 mg by mouth 2 (two) times daily.    [provider]  cetirizine (ZYRTEC) 10 MG tablet Take 10 mg by mouth daily.    [provider]  Cholecalciferol (VITAMIN D) 50 MCG (2000 UT) CAPS Take 2,000 Units by  mouth daily.    [provider]  divalproex (DEPAKOTE ER) 250 MG 24 hr tablet Take 250 mg by mouth at bedtime. (Take with '500mg'$  tablet to equal '750mg'$  total)    [provider]  divalproex (DEPAKOTE ER) 500 MG 24 hr tablet Take 500 mg by mouth at bedtime. (Take with '250mg'$  tablet to equal '750mg'$  total)    [provider]  docusate sodium (COLACE) 100 MG capsule Take 100 mg by mouth daily.    [provider]  furosemide (LASIX) 20 MG tablet Take 1 tablet (20 mg total) by mouth daily. 12/28/20   Domenic Polite, MD  isosorbide mononitrate (IMDUR) 30 MG 24 hr tablet Take 1 tablet (30 mg total) by mouth daily. 04/18/20   Loletha Grayer, MD  metoprolol tartrate (LOPRESSOR) 25 MG tablet Take 25 mg by mouth 2 (two) times daily. 06/21/20   [provider]  mirtazapine (REMERON) 15 MG tablet Take 15 mg by mouth at bedtime. 11/17/20   [provider]  Probiotic Product (PROBIOTIC DAILY PO) Take 1 capsule by mouth daily.    [provider]  tamoxifen (NOLVADEX) 20 MG tablet Take 1 tablet by mouth once daily 03/31/21   [name], MD  vitamin B-12 (CYANOCOBALAMIN) 500 MCG tablet Take 500 mcg by mouth 2 (two) times a day.    [provider]    Physical Exam: Vitals:   02/06/22 1605 02/06/22 1630 02/06/22 1700  BP: (!) 112/57 113/64 107/62  Pulse:  66 (!) 109  Resp: (!) 25 12 (!) 26  Temp: (!) 103.5 F (39.7 C)    TempSrc: Rectal    SpO2: 96% 96% 97%  Weight: 56 kg     Physical Exam HENT:     Head: Normocephalic.     Mouth/Throat:     Pharynx: No oropharyngeal exudate.  Eyes:     General: Lids are normal.     Conjunctiva/sclera: Conjunctivae normal.  Cardiovascular:     Rate and Rhythm: Normal rate and regular rhythm.     Heart sounds: Normal heart sounds, S1 normal and S2 normal.  Pulmonary:     Breath sounds: No decreased breath sounds, wheezing, rhonchi or rales.  Abdominal:     Palpations: Abdomen is soft.      Tenderness: There is no abdominal tenderness.  Musculoskeletal:     Right lower leg: No swelling.     Left lower leg: No swelling.  Skin:    General: Skin is warm.     Comments: Bruising lower extremities.  Neurological:     Mental Status: She is alert.     Comments: Answers questions appropriately.  Able to straight leg raise to command.  Power 4+ out of 5 bilaterally.    Data Reviewed: Glucose 109, troponin 19, lactic acid 2.8, white blood cell count 7.5, hemoglobin 11.0, platelet count 179  Assessment and Plan: 1.  Sepsis, present on admission.  Patient with tachycardia and fever and acute cystitis without hematuria.  ER physician gave vancomycin and Maxipime.  I will change antibiotics to Rocephin.  Follow-up blood cultures and urine cultures.  2.  Acute cystitis without hematuria.  Follow-up urine culture ordered by me.  Continue Rocephin.  Initial antibiotics ordered by ER physician.  3.  Lactic acidosis.  Repeat lactate ordered.  Continue IV fluids.  4.  Type 2 diabetes mellitus with hyperlipidemia.  Add on hemoglobin A1c.  Sliding scale insulin for now.  Continue cholesterol medication  5.  Atrial fibrillation with rapid ventricular response.  Improved with IV fluid hydration.  Continue Eliquis for anticoagulation.  With blood pressure being on the low side I will hold metoprolol for right now.  6.  Dementia with behavioral disturbance.  Haldol as needed just in case she sundown's here in the hospital.  Patient's daughter states that she did sundown the last time she was in the hospital.  She will have 24/7 care here in the hospital while the patient is here.  Continue Remeron, Depakote and BuSpar.  7.  Patient is a DO NOT RESUSCITATE      Advance Care Planning:   Code Status: DNR  Consults: None  Family Communication: Spoke with daughter-in-law at the bedside  Severity of Illness: The appropriate patient status for this patient is INPATIENT. Inpatient status is judged  to be reasonable and necessary in order to provide the required intensity of service to ensure the patient's safety. The patient's presenting symptoms, physical exam findings, and initial radiographic and laboratory data in the context of their chronic comorbidities is felt to place them at high risk for further clinical deterioration. Furthermore, it is not anticipated that the patient will be medically stable for discharge from the hospital within 2 midnights of admission.   * I certify that at the point of admission it is my clinical judgment that the patient will require inpatient hospital care spanning beyond 2 midnights from the point of admission due to high intensity of service, high risk for further deterioration and high frequency of surveillance required.*  Author: Loletha Grayer, MD 02/06/2022 6:11 PM  For on call review [email or web address].

## 2022-02-06 NOTE — Progress Notes (Signed)
CODE SEPSIS - PHARMACY COMMUNICATION  **Broad Spectrum Antibiotics should be administered within 1 hour of Sepsis diagnosis**  Time Code Sepsis Called/Page Received: 1615  Antibiotics Ordered: vancomycin, cefepime  Time of 1st antibiotic administration: 1642  Additional action taken by pharmacy: none required  If necessary, Name of Provider/Nurse Contacted: N/A    Dallie Piles ,PharmD Clinical Pharmacist  02/06/2022  4:13 PM

## 2022-02-06 NOTE — Assessment & Plan Note (Addendum)
Twice a day oral Haldol and Seroquel at night.

## 2022-02-06 NOTE — Sepsis Progress Note (Signed)
eLink is following this Code Sepsis. °

## 2022-02-06 NOTE — Assessment & Plan Note (Signed)
We will add on a hemoglobin A1c and put on sliding scale insulin.  Continue cholesterol medication

## 2022-02-06 NOTE — Progress Notes (Signed)
PHARMACY -  BRIEF ANTIBIOTIC NOTE   Pharmacy has received consult(s) for vancomycin and cefepime from an ED provider.  The patient's profile has been reviewed for ht/wt/allergies/indication/available labs.    One time order(s) placed for   1) cefepime 2 grams IV x 1  2) vancomycin 1000 mg IV x 1  Further antibiotics/pharmacy consults should be ordered by admitting physician if indicated.                       Thank you, Dallie Piles 02/06/2022  4:11 PM

## 2022-02-06 NOTE — Progress Notes (Unsigned)
[facility] Consult Note Telephone: [phone]  Fax: [phone]    Date of encounter: 02/06/22 5:18 PM PATIENT NAME: Holly Rose   [phone] (home)  DOB: May 08, 1930 MRN: [id number] PRIMARY CARE PROVIDER:    Kirk Ruths, MD,  [place] [id number] [phone]  REFERRING PROVIDER:   Kirk Ruths, MD [facility] [place],  [place] [address] [phone]  RESPONSIBLE PARTY:    Contact Information     Name Relation Home Work [place] Relative   [phone]   Joshlynn, Alfonzo [phone]  [phone]   Newlin,Frankie Daughter   [phone]        I met face to face with patient and family in *** home/facility. Palliative Care was asked to follow this patient by consultation request of  Kirk Ruths, MD to address advance care planning and complex medical decision making. This is a follow up visit.                                   ASSESSMENT AND PLAN / RECOMMENDATIONS:   Advance Care Planning/Goals of Care: Goals include to maximize quality of life and symptom management. Patient/health care surrogate gave his/her permission to discuss. Our advance care planning conversation included a discussion about:    The value and importance of advance care planning  Experiences with loved ones who have been seriously ill or have died  Exploration of personal, cultural or spiritual beliefs that might influence medical decisions  Exploration of goals of care in the event of a sudden injury or illness  Identification of a healthcare agent  Review and updating or creation of an  advance directive document . Decision not to resuscitate or to de-escalate disease focused treatments due to poor prognosis. CODE STATUS:  Symptom Management/Plan:    Follow up  Palliative Care Visit: Palliative care will continue to follow for complex medical decision making, advance care planning, and clarification of goals. Return *** weeks or prn.  I spent *** minutes providing this consultation. More than 50% of the time in this consultation was spent in counseling and care coordination.  This visit was coded based on medical decision making (MDM).***  PPS: ***0%  HOSPICE ELIGIBILITY/DIAGNOSIS: TBD  Chief Complaint: ***  HISTORY OF PRESENT ILLNESS:  Holly Rose is a 88 y.o. year old female  with *** .   History obtained from review of EMR, discussion with primary team, and interview with family, facility staff/caregiver and/or Holly Rose.  I reviewed available labs, medications, imaging, studies and related documents from the EMR.  Records reviewed and summarized above.   ROS  *** General: NAD EYES: denies vision changes ENMT: denies dysphagia Cardiovascular: denies chest pain, denies DOE Pulmonary: denies cough, denies increased SOB Abdomen: endorses good appetite, denies constipation, endorses continence of bowel GU: denies dysuria, endorses continence of urine MSK:  denies increased weakness,  no falls reported Skin: denies rashes or wounds Neurological: denies pain, denies insomnia Psych: Endorses positive mood Heme/lymph/immuno: denies bruises, abnormal bleeding  Physical Exam: Current and past weights: Constitutional: NAD General: frail appearing, thin/WNWD/obese  EYES: anicteric sclera, lids intact, no discharge  ENMT: intact hearing, oral mucous membranes moist, dentition intact CV: S1S2, RRR, no LE edema Pulmonary: LCTA, no increased work of breathing,  no cough, room air Abdomen: intake 100%, normo-active BS + 4 quadrants, soft and non tender, no ascites GU: deferred MSK: no sarcopenia, moves all extremities, ambulatory Skin: warm and dry, no rashes or wounds on visible skin Neuro:  no generalized weakness,  no cognitive  impairment Psych: non-anxious affect, A and O x 3 Hem/lymph/immuno: no widespread bruising   Thank you for the opportunity to participate in the care of Holly Rose.  The palliative care team will continue to follow. Please call our office at [phone] if we can be of additional assistance.   Ezekiel Slocumb, NP   COVID-19 PATIENT SCREENING TOOL Asked and negative response unless otherwise noted:   Have you had symptoms of covid, tested positive or been in contact with someone with symptoms/positive test in the past 5-10 days?

## 2022-02-06 NOTE — ED Triage Notes (Signed)
Resident from [facility] at [place], sent here by staff for fever (103.5 rectal here) and increased confusion/agitation; Tachycardic in 140s upon arrival, patient is also very agitated and combative (history of dementia)

## 2022-02-06 NOTE — Assessment & Plan Note (Signed)
Patient meets sepsis criteria on presentation with fever of 103 and tachycardia with acute cystitis without hematuria.  ER physician gave vancomycin and Maxipime.  I will change antibiotics over to Rocephin.  Chest x-ray negative.  COVID test and influenza negative

## 2022-02-06 NOTE — Assessment & Plan Note (Addendum)
Lactic acid normalized.

## 2022-02-06 NOTE — ED Notes (Signed)
Informed RN bed assigned 

## 2022-02-06 NOTE — Assessment & Plan Note (Signed)
Follow-up urine culture.  Continue Rocephin.

## 2022-02-06 NOTE — Assessment & Plan Note (Signed)
Improved with IV fluid hydration.  Continue IV fluids.  Restart low-dose Toprol-XL at night.  Continue Eliquis for anticoagulation.

## 2022-02-07 DIAGNOSIS — A4151 Sepsis due to Escherichia coli [E. coli]: Secondary | ICD-10-CM | POA: Diagnosis not present

## 2022-02-07 DIAGNOSIS — E872 Acidosis, unspecified: Secondary | ICD-10-CM | POA: Diagnosis not present

## 2022-02-07 DIAGNOSIS — I4891 Unspecified atrial fibrillation: Secondary | ICD-10-CM | POA: Diagnosis not present

## 2022-02-07 DIAGNOSIS — D696 Thrombocytopenia, unspecified: Secondary | ICD-10-CM

## 2022-02-07 DIAGNOSIS — N3000 Acute cystitis without hematuria: Secondary | ICD-10-CM | POA: Diagnosis not present

## 2022-02-07 LAB — BLOOD CULTURE ID PANEL (REFLEXED) - BCID2

## 2022-02-07 LAB — PROCALCITONIN: Procalcitonin: 7.59 ng/mL

## 2022-02-07 LAB — CBC
HCT: 32.2 % — ABNORMAL LOW (ref 36.0–46.0)
Hemoglobin: 10.1 g/dL — ABNORMAL LOW (ref 12.0–15.0)
MCH: 30.1 pg (ref 26.0–34.0)
MCHC: 31.4 g/dL (ref 30.0–36.0)
MCV: 95.8 fL (ref 80.0–100.0)
Platelets: 125 K/uL — ABNORMAL LOW (ref 150–400)
RBC: 3.36 MIL/uL — ABNORMAL LOW (ref 3.87–5.11)
RDW: 16.7 % — ABNORMAL HIGH (ref 11.5–15.5)
WBC: 11.6 K/uL — ABNORMAL HIGH (ref 4.0–10.5)
nRBC: 0 % (ref 0.0–0.2)

## 2022-02-07 LAB — BASIC METABOLIC PANEL WITH GFR
Anion gap: 7 (ref 5–15)
BUN: 23 mg/dL (ref 8–23)
CO2: 25 mmol/L (ref 22–32)
Calcium: 9.2 mg/dL (ref 8.9–10.3)
Chloride: 108 mmol/L (ref 98–111)
Creatinine, Ser: 0.67 mg/dL (ref 0.44–1.00)
GFR, Estimated: >60 mL/min
Glucose, Bld: 169 mg/dL — ABNORMAL HIGH (ref 70–99)
Potassium: 4 mmol/L (ref 3.5–5.1)
Sodium: 140 mmol/L (ref 135–145)

## 2022-02-07 LAB — HEMOGLOBIN A1C
Hgb A1c MFr Bld: 6.2 % — ABNORMAL HIGH (ref 4.8–5.6)
Mean Plasma Glucose: 131.24 mg/dL

## 2022-02-07 LAB — PROTIME-INR
INR: 1.7 — ABNORMAL HIGH (ref 0.8–1.2)
Prothrombin Time: 19.5 s — ABNORMAL HIGH (ref 11.4–15.2)

## 2022-02-07 LAB — CORTISOL-AM, BLOOD: Cortisol - AM: 9.7 ug/dL (ref 6.7–22.6)

## 2022-02-07 LAB — GLUCOSE, CAPILLARY
Glucose-Capillary: 131 mg/dL — ABNORMAL HIGH (ref 70–99)
Glucose-Capillary: 108 mg/dL — ABNORMAL HIGH (ref 70–99)

## 2022-02-07 MED ORDER — SODIUM CHLORIDE 0.9 % IV SOLN
2.0000 g | INTRAVENOUS | Status: DC
  Administered 2022-02-07 – 2022-02-09 (×3): 2 g via INTRAVENOUS
  Filled 2022-02-07 (×3): qty 20

## 2022-02-07 MED ORDER — METOPROLOL SUCCINATE ER 25 MG PO TB24
12.5000 mg | ORAL_TABLET | Freq: Every day | ORAL | Status: DC
  Administered 2022-02-07 – 2022-02-09 (×2): 12.5 mg via ORAL
  Filled 2022-02-07 (×2): qty 1

## 2022-02-07 NOTE — TOC Initial Note (Signed)
Transition of Care [facility]) - Initial/Assessment Note    Patient Details  Name: Holly Rose MRN: [id number] Date of Birth: Mar 26, 1930  Transition of Care [facility]) CM/SW Contact:    Magnus Ivan, LCSW Phone Number: 02/07/2022, 2:38 PM  Clinical Narrative:            Patient not fully oriented. CSW called patient's daughter in law Holly Rose for assessment. Patient lives at [facility] in their [place] Unit. Holly Rose stated patient has been in the Memory Care Unit for about 6 weeks, prior to that she was in [place] with her spouse.  PCP is Dr. Ouida Sills. Patient has a RW she uses as needed. [facility] in the past per chart. Holly Rose provides transportation if needed.  Holly Rose stated their plan is for patient to return to her ALF when medically ready. CSW left message for Joelene Millin at [facility]. Will do FL2 with DC medications when patient is DC.  Holly Rose had some concerns about their experience in the ED recently, CSW provided contact information for Office of Patient Experience to [place].        Expected Discharge Plan: Memory Care Barriers to Discharge: Continued Medical Work up   Patient Goals and CMS Choice Patient states their goals for this hospitalization and ongoing recovery are:: return to [facility] CMS Medicare.gov Compare Post Acute Care list provided to:: Patient Represenative (must comment) Choice offered to / list presented to : Adult Children  Expected Discharge Plan and Services Expected Discharge Plan: Memory Care       Living arrangements for the past 2 months: Single Family Home                                      Prior Living Arrangements/Services Living arrangements for the past 2 months: Single Family Home Lives with:: Facility Resident Patient language and need for interpreter reviewed:: Yes Do you feel safe going back to the place where you live?: Yes      Need for Family  Participation in Patient Care: Yes (Comment) Care giver support system in place?: Yes (comment) Current home services: DME Criminal Activity/Legal Involvement Pertinent to Current Situation/Hospitalization: No - Comment as needed  Activities of Daily Living Home Assistive Devices/Equipment: Walker (specify type) ADL Screening (condition at time of admission) Patient's cognitive ability adequate to safely complete daily activities?: No Is the patient deaf or have difficulty hearing?: No Does the patient have difficulty seeing, even when wearing glasses/contacts?: No Does the patient have difficulty concentrating, remembering, or making decisions?: Yes Patient able to express need for assistance with ADLs?: Yes Does the patient have difficulty dressing or bathing?: Yes Independently performs ADLs?: No Communication: Independent Dressing (OT): Needs assistance Grooming: Needs assistance Feeding: Needs assistance Toileting: Needs assistance In/Out Bed: Needs assistance Walks in Home: Needs assistance Does the patient have difficulty walking or climbing stairs?: No Weakness of Legs: None Weakness of Arms/Hands: None  Permission Sought/Granted Permission sought to share information with : Chartered certified accountant granted to share information with : Yes, Verbal Permission Granted (by Holly Rose (daughter in law))     Permission granted to share info w AGENCY: [facility]        Emotional Assessment       Orientation: : Fluctuating Orientation (Suspected and/or reported Sundowners) Alcohol / Substance Use: Not Applicable Psych Involvement: No (comment)  Admission diagnosis:  Sepsis [facility]) [A41.9] Patient Active Problem List   Diagnosis Date Noted   Sepsis due to Escherichia coli (E. coli) ([place]) 02/07/2022   Thrombocytopenia ([place]) 02/07/2022   Sepsis ([place]) 02/06/2022   Acute cystitis without hematuria 02/06/2022   Lactic acidosis 02/06/2022   DNR (do not resuscitate)     Dementia with behavioral disturbance ([place]) 02/03/2022   Foot ulcer ([place]) 09/12/2021   Aortic atherosclerosis ([place]) 05/11/2021   Emphysema lung ([place]) 05/11/2021   Choledocholithiasis    Acute pancreatitis 02/19/2021   Cholelithiasis    Osteoporosis 01/22/2021   Pancreatitis, acute 12/25/2020   Pancreatitis 12/25/2020   Dementia without behavioral disturbance ([place])    Stroke ([place]) 07/22/2020   TIA (transient ischemic attack) 07/22/2020   Depression, major, single episode, mild (HCC) 07/22/2020   Visual hallucinations 07/21/2020   NSTEMI (non-ST elevated myocardial infarction) ([place]) 04/17/2020   Atrial fibrillation with RVR ([place]) 04/17/2020   Change in mental status 04/15/2020   Head injury 04/15/2020   Dysuria 04/30/2019   Frequent urinary tract infections 04/30/2019   Age-related osteoporosis without current pathological fracture 03/01/2019   Dizziness 08/29/2018   UTI (urinary tract infection) 08/15/2018   Vitamin D deficiency, unspecified 06/16/2017   Hypercalcinuria 06/14/2017   SCC (squamous cell carcinoma), face 03/29/2017   History of kidney stones 03/12/2017   Breast cancer, left ([place]) 12/28/2016   Chronic cystitis 11/05/2016   Nephrolithiasis 09/47/0962   Renal colic 83/66/2947   Urge incontinence 11/05/2016   Bilateral hand pain 11/03/2016   Generalized osteoarthritis of hand 11/03/2016   Numbness and tingling in both hands 11/03/2016   Bradycardia 04/15/2016   History of colonic polyps 09/16/2015   Abdominal pain 08/18/2015   Rectal bleeding 08/18/2015   Coronary artery disease with angina pectoris ([place]) 02/17/2015   Atrial fibrillation with rapid ventricular response ([place]) 02/17/2015   Diabetes mellitus with neuropathy ([place]) 02/17/2015   Hyperlipidemia 02/17/2015   Iron deficiency anemia 02/17/2015   Macular degeneration 02/17/2015   Stress 02/17/2015   Health care maintenance 02/17/2015   Diarrhea 02/17/2015   Type 2 diabetes mellitus with hyperlipidemia  ([place]) 07/13/2014   Diverticulosis 03/31/2013   GI bleeding 03/31/2013   Mitral valve prolapse 03/31/2013   Diverticulosis of intestine without perforation or abscess without bleeding 03/31/2013   PCP:  Kirk Ruths, MD Pharmacy:   [facility] [place], [place] - [address] [place] [address] Phone: [phone] Fax: [phone]     Social Determinants of Health (SDOH) Interventions    Readmission Risk Interventions    02/26/2021   10:04 AM 02/22/2021    3:33 PM  Readmission Risk Prevention Plan  Transportation Screening Complete Complete  PCP or Specialist Appt within 3-5 Days  Complete  HRI or [place]  Complete  Social Work Consult for [place] Planning/Counseling  Complete  Palliative Care Screening  Complete  Medication Review Press photographer) Complete Complete  HRI or [place] Complete   [place] Not Applicable

## 2022-02-07 NOTE — Progress Notes (Signed)
  Progress Note   Patient: Holly Rose GNF:[id number] DOB: 1929/12/24 DOA: 02/06/2022     1 DOS: the patient was seen and examined on 02/07/2022     Assessment and Plan: * Sepsis due to Escherichia coli (E. coli) ([place]) Present on admission.  E. coli growing in blood cultures.  Likely urine source.  Patient had tachycardia and fever on presentation.  Still awaiting urine culture.  Continue Rocephin.  Need to follow-up sensitivities on E. coli prior to any disposition.  Acute cystitis without hematuria Follow-up urine culture.  Continue Rocephin.  Lactic acidosis Gentle IV fluids.  Type 2 diabetes mellitus with hyperlipidemia (HCC) Hemoglobin A1c good at 6.2.  Will discontinue sliding scale insulin.  Continue cholesterol medication  Atrial fibrillation with rapid ventricular response (HCC) Improved with IV fluid hydration.  Continue IV fluids.  Restart low-dose Toprol-XL at night.  Continue Eliquis for anticoagulation.  Dementia with behavioral disturbance ([place]) Patient's mental status currently okay.  Patient's daughter-in-law states that she does get altered mental status at times especially in the hospital.  We will give as needed Haldol.  On Depakote and BuSpar.  Thrombocytopenia ([place]) Likely secondary to sepsis continue to monitor.        Subjective: Patient awakened from sleep.  States she feels a little sleepy.  Otherwise did not offer any complaints.  She was able to straight leg raise.  Physical Exam: Vitals:   02/07/22 0017 02/07/22 0503 02/07/22 0744 02/07/22 1232  BP: (!) 104/53 127/86 (!) 111/55 90/70  Pulse: 78 89 69 68  Resp: '16 16 17 18  '$ Temp: 98 F (36.7 C) 97.8 F (36.6 C)    TempSrc:      SpO2: 95% 100% 99% 93%  Weight:      Height:       Physical Exam HENT:     Head: Normocephalic.     Mouth/Throat:     Pharynx: No oropharyngeal exudate.  Eyes:     General: Lids are normal.     Conjunctiva/sclera: Conjunctivae normal.  Cardiovascular:      Rate and Rhythm: Normal rate and regular rhythm.     Heart sounds: Normal heart sounds, S1 normal and S2 normal.  Pulmonary:     Breath sounds: No decreased breath sounds, wheezing, rhonchi or rales.  Abdominal:     Palpations: Abdomen is soft.     Tenderness: There is abdominal tenderness in the suprapubic area.  Musculoskeletal:     Right lower leg: No swelling.     Left lower leg: No swelling.  Skin:    General: Skin is warm.     Comments: Bruising lower extremities.  Neurological:     Mental Status: She is alert.     Comments: Answers questions appropriately.  Able to straight leg raise to command.     Data Reviewed: 3 out of 4 blood cultures positive for E. coli, creatinine 0.67, lactic acid 2.8, procalcitonin 7.59, white blood cell count 11.6, hemoglobin 10.1, platelet count 125  Family Communication: Spoke with daughter-in-law  Disposition: Status is: Inpatient Remains inpatient appropriate because: Being treated for E. coli sepsis with IV antibiotics Planned Discharge Destination: Back to memory care at [facility] [place]: Loletha Grayer, MD 02/07/2022 12:34 PM  For on call review [email or web address].

## 2022-02-07 NOTE — Assessment & Plan Note (Addendum)
Present on admission.  E. coli growing in blood cultures.  Likely urine source.  Patient had tachycardia and fever on presentation.  Still awaiting urine culture.  Continue Rocephin.  Need to follow-up sensitivities on E. coli prior to any disposition.

## 2022-02-07 NOTE — Plan of Care (Signed)
Patient is alert and oriented x 1. Patient was admitted for UTI, very confused, agitated, and can be physically aggressive. Patient has a Charity fundraiser. Haldol given for agitation as needed. Antibiotics given per MD order. No signs of distress noted. Will continue to monitor.

## 2022-02-07 NOTE — Assessment & Plan Note (Signed)
Likely secondary to sepsis continue to monitor.

## 2022-02-08 ENCOUNTER — Other Ambulatory Visit: Payer: Self-pay

## 2022-02-08 DIAGNOSIS — N3000 Acute cystitis without hematuria: Secondary | ICD-10-CM | POA: Diagnosis not present

## 2022-02-08 DIAGNOSIS — R410 Disorientation, unspecified: Secondary | ICD-10-CM | POA: Diagnosis not present

## 2022-02-08 DIAGNOSIS — E872 Acidosis, unspecified: Secondary | ICD-10-CM | POA: Diagnosis not present

## 2022-02-08 DIAGNOSIS — A4151 Sepsis due to Escherichia coli [E. coli]: Secondary | ICD-10-CM | POA: Diagnosis not present

## 2022-02-08 MED ORDER — RISPERIDONE 0.5 MG PO TABS: 1.0000 mg | ORAL_TABLET | Freq: Every evening | ORAL | Status: DC

## 2022-02-08 MED ORDER — HALOPERIDOL 0.5 MG PO TABS
1.0000 mg | ORAL_TABLET | ORAL | Status: DC
  Administered 2022-02-08 – 2022-02-10 (×4): 1 mg via ORAL
  Filled 2022-02-08 (×4): qty 2

## 2022-02-08 MED ORDER — QUETIAPINE FUMARATE 25 MG PO TABS
12.5000 mg | ORAL_TABLET | ORAL | Status: AC
  Administered 2022-02-08: 12.5 mg via ORAL
  Filled 2022-02-08: qty 1

## 2022-02-08 MED ORDER — QUETIAPINE FUMARATE 25 MG PO TABS: 25.0000 mg | ORAL_TABLET | ORAL | Status: DC

## 2022-02-08 MED ORDER — QUETIAPINE FUMARATE 25 MG PO TABS: 50.0000 mg | ORAL_TABLET | Freq: Every day | ORAL | Status: DC

## 2022-02-08 NOTE — Progress Notes (Signed)
  Progress Note   Patient: Holly Rose [id number] DOB: Feb 27, 1930 DOA: 02/06/2022     2 DOS: the patient was seen and examined on 02/08/2022     Assessment and Plan: * Acute delirium The patient did not sleep last night.  Patient is on as needed IV Haldol.  Oral Haldol added by psychiatry.  Larger dose of Seroquel at night.  Sepsis due to Escherichia coli (E. coli) ([place]) Present on admission.  E. coli growing in blood cultures and urine cultures.  Still awaiting sensitivities.  Patient had tachycardia and fever on presentation.  Continue Rocephin.    Acute cystitis without hematuria Continue Rocephin.  Lactic acidosis Discontinue IV fluids with acute delirium.  Type 2 diabetes mellitus with hyperlipidemia (HCC) Hemoglobin A1c good at 6.2.  Continue cholesterol medication  Atrial fibrillation with rapid ventricular response (HCC) Continue low-dose Toprol-XL at night.  Continue Eliquis for anticoagulation.  Dementia with behavioral disturbance (HCC) Twice a day oral Haldol and Seroquel at night.  Other medications discontinued.  Thrombocytopenia ([place]) Likely secondary to sepsis continue to monitor.        Subjective: When I saw the patient today she was answering questions appropriately.  Apparently had a rough night and did not sleep at all.  Admitted for E. coli sepsis.  Physical Exam: Vitals:   02/07/22 1243 02/07/22 1544 02/07/22 2033 02/08/22 1029  BP: (!) 105/48 (!) 109/55 119/72 121/65  Pulse:  70 86 70  Resp:  17  14  Temp:  98.2 F (36.8 C) 98 F (36.7 C) 97.9 F (36.6 C)  TempSrc:   Oral Oral  SpO2:  100% 99% 100%  Weight:      Height:       Physical Exam HENT:     Head: Normocephalic.     Mouth/Throat:     Pharynx: No oropharyngeal exudate.  Eyes:     General: Lids are normal.     Conjunctiva/sclera: Conjunctivae normal.  Cardiovascular:     Rate and Rhythm: Normal rate and regular rhythm.     Heart sounds: Normal heart sounds, S1 normal  and S2 normal.  Pulmonary:     Breath sounds: No decreased breath sounds, wheezing, rhonchi or rales.  Abdominal:     Palpations: Abdomen is soft.     Tenderness: There is no abdominal tenderness.  Musculoskeletal:     Right lower leg: No swelling.     Left lower leg: No swelling.  Skin:    General: Skin is warm.     Comments: Bruising lower extremities.  Neurological:     Mental Status: She is alert.     Comments: Answers questions appropriately.     Data Reviewed: E. coli and urine culture and blood cultures.  Sensitivities still pending.  Family Communication: Spoke with daughter-in-law at the bedside  Disposition: Status is: Inpatient Remains inpatient appropriate because: Treating for E. coli sepsis.  Still awaiting sensitivities.  Also has acute in-hospital delirium today. Planned Discharge Destination: Back to memory care once stable.   Author: Loletha Grayer, MD 02/08/2022 1:38 PM  For on call review [email or web address].

## 2022-02-08 NOTE — Assessment & Plan Note (Addendum)
The patient slept last night and still sleeping this morning.  Back on the Seroquel dose down to 25 mg at night.  Continue Haldol twice a day during the day.  The patient did have periods where she was able to answer questions appropriately

## 2022-02-08 NOTE — Consult Note (Addendum)
[facility] Consult   Reason for Consult: Delirium and dementia Referring Physician:  Loletha Grayer, MD Patient Identification: Holly Rose MRN:  [id number] Principal Diagnosis: Sepsis due to Escherichia coli (E. coli) ([place]) Diagnosis:  Principal Problem:   Sepsis due to Escherichia coli (E. coli) ([place]) Active Problems:   Atrial fibrillation with rapid ventricular response (HCC)   Type 2 diabetes mellitus with hyperlipidemia (HCC)   Atrial fibrillation with RVR ([place])   Dementia with behavioral disturbance ([place])   Sepsis ([place])   Acute cystitis without hematuria   Lactic acidosis   Thrombocytopenia ([place])   Total Time spent with patient: 15 minutes  Subjective:   Aarohi A Linehan is a 88 y.o. female patient admitted with sepsis due to a UTI.  She resides in a memory care nursing Home at [facility].  She has a history of stroke, coronary artery disease, type 2 diabetes, pancreatitis, A-fib, and prolonged QT.  She has seen Dr. Nicolasa Ducking for sundowning who has her on Remeron and Depakote.  She does have a family member present and I believe it is her daughter-in-law.  She did see Barbaraann Share our nurse practitioner from psychiatry.  She continues to sundown and be agitated, especially in the evening.  Daughter-in-law states that she is not sleeping very well and responds to internal stimuli. She did receive Haldol this morning and she seems to be doing better on that.  It does not sound like Remeron and the Depakote have been very effective.  Her daughter-in-law informs me that she is also hallucinating at night.  We had a discussion about antipsychotics, prolonged QT interval, and the risk versus reward.  I told her that she would probably do better on Haldol and Seroquel and she agreed.  HPI:  Holly Rose is a 88 y.o. female with medical history significant of dementia memory care, stroke, CAD, type 2 diabetes mellitus, pancreatitis, atrial fibrillation.  She presents to the  hospital with fever of 103 elevated heart rate and some agitation.  Patient's family stated that she was shaking and she was rigid and complained of pain over at her memory care.  She has been having a strong odor on her urine for couple days.  She has been not feeling well for few days.  She does have 24/7 care at [facility].  In the ER she was found to have a high fever, positive urine analysis, tachycardia and lactic acidosis.  Hospitalist services were contacted for further evaluation.  Past Psychiatric History: Dementia with behavioral disturbance and has been recently treated by Dr. Nicolasa Ducking.  Risk to Self:   Risk to Others:   Prior Inpatient Therapy:   Prior Outpatient Therapy:    Past Medical History:  Past Medical History:  Diagnosis Date   Allergy    Anemia    Arthritis    Atrial fibrillation ([place]) 2004   Breast cancer, left ([place]) 10/2017   Lumpectomy and rad tx's.    CAD (coronary artery disease)    Complication of anesthesia    hard to wake up with general anesthesia   Diabetes ([place])    Dysrhythmia    A-fib   Heart attack ([place]) 2008   Heart murmur    History of kidney stones    History of sick sinus syndrome    s/p pacemaker placement   Hyperlipidemia    Macular degeneration    Neuropathy    Neuropathy    Pacemaker 06/2010   Personal history of radiation therapy 11/2017  LEFT lumpectomy    Squamous cell skin cancer    TIA (transient ischemic attack)     Past Surgical History:  Procedure Laterality Date   ABDOMINAL HYSTERECTOMY  1960's   Startex and 1995   BREAST BIOPSY Left 12/17/2016   SINGLE FRAGMENT OF ATYPICAL EPITHELIAL CELLS   BREAST BIOPSY Left 04/08/2017   [facility]   BREAST EXCISIONAL BIOPSY Left 01/18/1986   Benign microcalcifications, [employer].   BREAST LUMPECTOMY Left 05/26/2017   13 mm,T1c, N0; ER/ PR+, Her 2 neu negative, HIGH RISK by Mammoprint ;  Surgeon: [name], MD;  Location: [facility] ORS;   Service: General;  Laterality: Left;   [place] PROPOFOL N/A 09/12/2015   Procedure: COLONOSCOPY WITH PROPOFOL;  Surgeon: [name], MD;  Location: [facility] ENDOSCOPY;  Service: Endoscopy;  Laterality: N/A;   ERCP N/A 02/21/2021   Procedure: ENDOSCOPIC RETROGRADE CHOLANGIOPANCREATOGRAPHY (ERCP);  Surgeon: Lucilla Lame, MD;  Location: [facility] ENDOSCOPY;  Service: Endoscopy;  Laterality: N/A;   IMPLANTABLE CARDIOVERTER DEFIBRILLATOR (ICD) GENERATOR CHANGE Left 03/29/2019   Procedure: PACEMAKER CHANGE OUT;  Surgeon: [name], MD;  Location: [facility] ORS;  Service: Cardiovascular;  Laterality: Left;   KIDNEY STONE SURGERY  2018   MASS EXCISION Left 02/24/2021   Procedure: EXCISION MASS SCALP;  Surgeon: [name], MD;  Location: [facility] ORS;  Service: General;  Laterality: Left;   MOHS SURGERY  2019   forehead   pace maker  2008   partial toe amputaions Left    2nd and 3rd toes   RECTOCELE REPAIR  1996   TONSILLECTOMY AND ADENOIDECTOMY  1950"s   Family History:  Family History  Problem Relation Age of Onset   Stroke Mother    Diabetes Mother    CAD Father    Lung cancer Brother    Cancer Brother    Arthritis Maternal Grandmother    Diabetes Son    Cancer Son    Cancer Maternal Aunt        Breast Cancer   Breast cancer Maternal Aunt    Cancer Maternal Aunt        Lung Cancer   Kidney disease Neg Hx    Bladder Cancer Neg Hx    Family Psychiatric  History: Unremarkable Social History:  Social History   Substance and Sexual Activity  Alcohol Use No   Alcohol/week: 0.0 standard drinks     Social History   Substance and Sexual Activity  Drug Use No    Social History   Socioeconomic History   Marital status: Married    Spouse name: Tom   Number of children: 2   Years of education: Not on file   Highest education level: Not on file  Occupational History    Not on file  Tobacco Use   Smoking status: Former    Years: 5.00    Types: Cigarettes    Quit date: 09/14/1968    Years since quitting: 53.4   Smokeless tobacco: Never   Tobacco comments:    quit 1970  Vaping Use   Vaping Use: Never used  Substance and Sexual Activity   Alcohol use: No    Alcohol/week: 0.0 standard drinks   Drug use: No   Sexual activity: Not on file  Other Topics Concern   Not on file  Social  History Narrative   Live home with husband in [place]   Social Determinants of Health   Financial Resource Strain: Low Risk    Difficulty of Paying Living Expenses: Not hard at all  Food Insecurity: No Food Insecurity   Worried About Charity fundraiser in the Last Year: Never true   Arboriculturist in the Last Year: Never true  Transportation Needs: No Transportation Needs   Lack of Transportation (Medical): No   Lack of Transportation (Non-Medical): No  Physical Activity: Insufficiently Active   Days of Exercise per Week: 2 days   Minutes of Exercise per Session: 20 min  Stress: No Stress Concern Present   Feeling of Stress : Not at all  Social Connections: Moderately Integrated   Frequency of Communication with Friends and Family: Three times a week   Frequency of Social Gatherings with Friends and Family: Twice a week   Attends Religious Services: More than 4 times per year   Active Member of [employer] or Organizations: No   Attends Archivist Meetings: Never   Marital Status: Married   Additional Social History:    Allergies:   Allergies  Allergen Reactions   Lyrica [Pregabalin] Swelling    Sleepy, does not eat   Neurontin [Gabapentin] Nausea And Vomiting and Other (See Comments)    disoriented   Bactrim [Sulfamethoxazole-Trimethoprim]     Made her dizziness    Labs:  Results for orders placed or performed during the hospital encounter of 02/06/22 (from the past 48 hour(s))  Blood Culture (routine x 2)     Status: Abnormal  (Preliminary result)   Collection Time: 02/06/22  4:09 PM   Specimen: BLOOD LEFT HAND  Result Value Ref Range   Specimen Description      BLOOD LEFT HAND Performed at [facility], [address] [place], [place] [address]    Special Requests      BOTTLES DRAWN AEROBIC AND ANAEROBIC Blood Culture results may not be optimal due to an inadequate volume of blood received in culture bottles Performed at [facility], [address], [place], [place] [address]    Culture  Setup Time      GRAM NEGATIVE RODS IN BOTH AEROBIC AND ANAEROBIC BOTTLES Organism ID to follow CRITICAL RESULT CALLED TO, READ BACK BY AND VERIFIED WITH: JASON ROBBINS '@0634'$  02/07/22 MJU Performed at [facility], [address] [place], [place] [address]    Culture ESCHERICHIA COLI (A)    Report Status PENDING   Blood Culture ID Panel (Reflexed)     Status: Abnormal   Collection Time: 02/06/22  4:09 PM  Result Value Ref Range   Enterococcus faecalis NOT DETECTED NOT DETECTED   Enterococcus Faecium NOT DETECTED NOT DETECTED   Listeria monocytogenes NOT DETECTED NOT DETECTED   Staphylococcus species NOT DETECTED NOT DETECTED   Staphylococcus aureus (BCID) NOT DETECTED NOT DETECTED   Staphylococcus epidermidis NOT DETECTED NOT DETECTED   Staphylococcus lugdunensis NOT DETECTED NOT DETECTED   Streptococcus species NOT DETECTED NOT DETECTED   Streptococcus agalactiae NOT DETECTED NOT DETECTED   Streptococcus pneumoniae NOT DETECTED NOT DETECTED   Streptococcus pyogenes NOT DETECTED NOT DETECTED   A.calcoaceticus-baumannii NOT DETECTED NOT DETECTED   Bacteroides fragilis NOT DETECTED NOT DETECTED   Enterobacterales DETECTED (A) NOT DETECTED    Comment: Enterobacterales represent a large order of gram negative bacteria, not a single organism. CRITICAL RESULT CALLED TO, READ BACK BY AND VERIFIED WITH: JASON ROBBINS @  4825 02/07/22 MJU    Enterobacter cloacae complex NOT DETECTED NOT  DETECTED   Escherichia coli DETECTED (A) NOT DETECTED    Comment: CRITICAL RESULT CALLED TO, READ BACK BY AND VERIFIED WITH: JASON ROBBINS '@0634'$  02/07/22 MJU    Klebsiella aerogenes NOT DETECTED NOT DETECTED   Klebsiella oxytoca NOT DETECTED NOT DETECTED   Klebsiella pneumoniae NOT DETECTED NOT DETECTED   Proteus species NOT DETECTED NOT DETECTED   Salmonella species NOT DETECTED NOT DETECTED   Serratia marcescens NOT DETECTED NOT DETECTED   Haemophilus influenzae NOT DETECTED NOT DETECTED   Neisseria meningitidis NOT DETECTED NOT DETECTED   Pseudomonas aeruginosa NOT DETECTED NOT DETECTED   Stenotrophomonas maltophilia NOT DETECTED NOT DETECTED   Candida albicans NOT DETECTED NOT DETECTED   Candida auris NOT DETECTED NOT DETECTED   Candida glabrata NOT DETECTED NOT DETECTED   Candida krusei NOT DETECTED NOT DETECTED   Candida parapsilosis NOT DETECTED NOT DETECTED   Candida tropicalis NOT DETECTED NOT DETECTED   Cryptococcus neoformans/gattii NOT DETECTED NOT DETECTED   CTX-M ESBL NOT DETECTED NOT DETECTED   Carbapenem resistance IMP NOT DETECTED NOT DETECTED   Carbapenem resistance KPC NOT DETECTED NOT DETECTED   Carbapenem resistance NDM NOT DETECTED NOT DETECTED   Carbapenem resist OXA 48 LIKE NOT DETECTED NOT DETECTED   Carbapenem resistance VIM NOT DETECTED NOT DETECTED    Comment: Performed at [facility], [address] [place],  [address]  Resp Panel by RT-PCR (Flu A&B, Covid) Anterior Nasal Swab     Status: None   Collection Time: 02/06/22  4:28 PM   Specimen: Anterior Nasal Swab  Result Value Ref Range   SARS Coronavirus 2 by RT PCR NEGATIVE NEGATIVE    Comment: (NOTE) SARS-CoV-2 target nucleic acids are NOT DETECTED.  The SARS-CoV-2 RNA is generally detectable in upper respiratory specimens during the acute phase of infection. The lowest concentration of SARS-CoV-2 viral copies this assay can detect is 138 copies/mL. A negative result does not  preclude SARS-Cov-2 infection and should not be used as the sole basis for treatment or other patient management decisions. A negative result may occur with  improper specimen collection/handling, submission of specimen other than nasopharyngeal swab, presence of viral mutation(s) within the areas targeted by this assay, and inadequate number of viral copies(<138 copies/mL). A negative result must be combined with clinical observations, patient history, and epidemiological information. The expected result is Negative.  Fact Sheet for Patients:  EntrepreneurPulse.com.au  Fact Sheet for Healthcare Providers:  IncredibleEmployment.be  This test is no t yet approved or cleared by the [place] FDA and  has been authorized for detection and/or diagnosis of SARS-CoV-2 by FDA under an Emergency Use Authorization (EUA). This EUA will remain  in effect (meaning this test can be used) for the duration of the COVID-19 declaration under Section 564(b)(1) of the Act, 21 U.S.C.section 360bbb-3(b)(1), unless the authorization is terminated  or revoked sooner.       Influenza A by PCR NEGATIVE NEGATIVE   Influenza B by PCR NEGATIVE NEGATIVE    Comment: (NOTE) The Xpert Xpress SARS-CoV-2/FLU/RSV plus assay is intended as an aid in the diagnosis of influenza from Nasopharyngeal swab specimens and should not be used as a sole basis for treatment. Nasal washings and aspirates are unacceptable for Xpert Xpress SARS-CoV-2/FLU/RSV testing.  Fact Sheet for Patients: EntrepreneurPulse.com.au  Fact Sheet for Healthcare Providers: IncredibleEmployment.be  This test is not yet approved or cleared by the [place] FDA and has  been authorized for detection and/or diagnosis of SARS-CoV-2 by FDA under an Emergency Use Authorization (EUA). This EUA will remain in effect (meaning this test can be used) for the duration of  the COVID-19 declaration under Section 564(b)(1) of the Act, 21 U.S.C. section 360bbb-3(b)(1), unless the authorization is terminated or revoked.  Performed at [facility], [address] [place], [place] [address]   Lactic acid, plasma     Status: Abnormal   Collection Time: 02/06/22  4:28 PM  Result Value Ref Range   Lactic Acid, Venous 2.8 (HH) 0.5 - 1.9 mmol/L    Comment: CRITICAL RESULT CALLED TO, READ BACK BY AND VERIFIED WITH KATE MCCAFFETY 02/06/22 @ 1657 BY SB Performed at [facility], [address] [place], [place] [address]   Comprehensive metabolic panel     Status: Abnormal   Collection Time: 02/06/22  4:28 PM  Result Value Ref Range   Sodium 137 135 - 145 mmol/L   Potassium 4.5 3.5 - 5.1 mmol/L   Chloride 103 98 - 111 mmol/L   CO2 24 22 - 32 mmol/L   Glucose, Bld 109 (H) 70 - 99 mg/dL    Comment: Glucose reference range applies only to samples taken after fasting for at least 8 hours.   BUN 23 8 - 23 mg/dL   Creatinine, Ser 0.80 0.44 - 1.00 mg/dL   Calcium 9.7 8.9 - 10.3 mg/dL   Total Protein 7.3 6.5 - 8.1 g/dL   Albumin 2.8 (L) 3.5 - 5.0 g/dL   AST 27 15 - 41 U/L   ALT 15 0 - 44 U/L   Alkaline Phosphatase 47 38 - 126 U/L   Total Bilirubin 0.8 0.3 - 1.2 mg/dL   GFR, Estimated >60 >60 mL/min    Comment: (NOTE) Calculated using the CKD-EPI Creatinine Equation (2021)    Anion gap 10 5 - 15    Comment: Performed at [facility], [address] [place], [place] [address]  CBC with Differential     Status: Abnormal   Collection Time: 02/06/22  4:28 PM  Result Value Ref Range   WBC 7.5 4.0 - 10.5 K/uL   RBC 3.71 (L) 3.87 - 5.11 MIL/uL   Hemoglobin 11.0 (L) 12.0 - 15.0 g/dL   HCT 35.0 (L) 36.0 - 46.0 %   MCV 94.3 80.0 - 100.0 fL   MCH 29.6 26.0 - 34.0 pg   MCHC 31.4 30.0 - 36.0 g/dL   RDW 16.3 (H) 11.5 - 15.5 %   Platelets 179 150 - 400 K/uL   nRBC 0.0 0.0 - 0.2 %   Neutrophils Relative % 93 %   Neutro Abs 6.9 1.7 - 7.7  K/uL   Lymphocytes Relative 5 %   Lymphs Abs 0.4 (L) 0.7 - 4.0 K/uL   Monocytes Relative 2 %   Monocytes Absolute 0.1 0.1 - 1.0 K/uL   Eosinophils Relative 0 %   Eosinophils Absolute 0.0 0.0 - 0.5 K/uL   Basophils Relative 0 %   Basophils Absolute 0.0 0.0 - 0.1 K/uL   Immature Granulocytes 0 %   Abs Immature Granulocytes 0.01 0.00 - 0.07 K/uL    Comment: Performed at [facility], [address] [place], [place] [address]  Protime-INR     Status: Abnormal   Collection Time: 02/06/22  4:28 PM  Result Value Ref Range   Prothrombin Time 16.2 (H) 11.4 - 15.2 seconds   INR 1.3 (H) 0.8 - 1.2    Comment: (  NOTE) INR goal varies based on device and disease states. Performed at [facility], [address] [place], [place] [address]   APTT     Status: None   Collection Time: 02/06/22  4:28 PM  Result Value Ref Range   aPTT 27 24 - 36 seconds    Comment: Performed at [facility], [address] [place], [place] [address]  Blood Culture (routine x 2)     Status: None (Preliminary result)   Collection Time: 02/06/22  4:28 PM   Specimen: BLOOD RIGHT FOREARM  Result Value Ref Range   Specimen Description BLOOD RIGHT FOREARM    Special Requests      BOTTLES DRAWN AEROBIC AND ANAEROBIC Blood Culture adequate volume   Culture  Setup Time      GRAM NEGATIVE RODS IN BOTH AEROBIC AND ANAEROBIC BOTTLES CRITICAL RESULT CALLED TO, READ BACK BY AND VERIFIED WITH: JASON ROBBINS '@0634'$  02/07/22 MJU Performed at [facility], [address] [place], [place] [address]    Culture GRAM NEGATIVE RODS    Report Status PENDING   Urinalysis, Complete w Microscopic Urine, Catheterized     Status: Abnormal   Collection Time: 02/06/22  4:28 PM  Result Value Ref Range   Color, Urine YELLOW (A) YELLOW   APPearance CLOUDY (A) CLEAR   Specific Gravity, Urine 1.014 1.005 - 1.030   pH 6.0 5.0 - 8.0   Glucose, UA NEGATIVE NEGATIVE mg/dL   Hgb urine dipstick NEGATIVE  NEGATIVE   Bilirubin Urine NEGATIVE NEGATIVE   Ketones, ur NEGATIVE NEGATIVE mg/dL   Protein, ur 30 (A) NEGATIVE mg/dL   Nitrite POSITIVE (A) NEGATIVE   Leukocytes,Ua LARGE (A) NEGATIVE   RBC / HPF 0-5 0 - 5 RBC/hpf   WBC, UA >50 (H) 0 - 5 WBC/hpf   Bacteria, UA RARE (A) NONE SEEN   Squamous Epithelial / LPF NONE SEEN 0 - 5   WBC Clumps PRESENT     Comment: Performed at [facility], [address] [place], [place] [address]  Brain natriuretic peptide     Status: Abnormal   Collection Time: 02/06/22  4:28 PM  Result Value Ref Range   B Natriuretic Peptide 221.6 (H) 0.0 - 100.0 pg/mL    Comment: Performed at [facility], [place], [place] [address]  Troponin I (High Sensitivity)     Status: Abnormal   Collection Time: 02/06/22  4:28 PM  Result Value Ref Range   Troponin I (High Sensitivity) 19 (H) <18 ng/L    Comment: (NOTE) Elevated high sensitivity troponin I (hsTnI) values and significant  changes across serial measurements may suggest ACS but many other  chronic and acute conditions are known to elevate hsTnI results.  Refer to the "Links" section for chest pain algorithms and additional  guidance. Performed at [facility], [address], [place], [place] [address]   Urine Culture     Status: Abnormal (Preliminary result)   Collection Time: 02/06/22  6:14 PM   Specimen: Urine, Clean Catch  Result Value Ref Range   Specimen Description      URINE, CLEAN CATCH Performed at [facility], [address], [place], [place] [address]    Special Requests      Normal Performed at [facility], [address] [place],  [address]    Culture >=100,000 COLONIES/mL GRAM NEGATIVE RODS (A)    Report Status PENDING   Lactic acid, plasma  Status: Abnormal   Collection Time: 02/06/22  7:16 PM  Result Value Ref Range   Lactic Acid, Venous 2.8 (HH) 0.5 - 1.9 mmol/L    Comment: CRITICAL VALUE NOTED. VALUE IS  CONSISTENT WITH PREVIOUSLY REPORTED/CALLED VALUE DLB Performed at [facility], [place], [place] [address]   Troponin I (High Sensitivity)     Status: Abnormal   Collection Time: 02/06/22  7:16 PM  Result Value Ref Range   Troponin I (High Sensitivity) 29 (H) <18 ng/L    Comment: (NOTE) Elevated high sensitivity troponin I (hsTnI) values and significant  changes across serial measurements may suggest ACS but many other  chronic and acute conditions are known to elevate hsTnI results.  Refer to the "Links" section for chest pain algorithms and additional  guidance. Performed at [facility], [address] [place], [place] [address]   Glucose, capillary     Status: Abnormal   Collection Time: 02/06/22 10:16 PM  Result Value Ref Range   Glucose-Capillary 184 (H) 70 - 99 mg/dL    Comment: Glucose reference range applies only to samples taken after fasting for at least 8 hours.  Protime-INR     Status: Abnormal   Collection Time: 02/07/22  4:28 AM  Result Value Ref Range   Prothrombin Time 19.5 (H) 11.4 - 15.2 seconds   INR 1.7 (H) 0.8 - 1.2    Comment: (NOTE) INR goal varies based on device and disease states. Performed at [facility], [address] [place],  [place] [address]   Cortisol-am, blood     Status: None   Collection Time: 02/07/22  4:28 AM  Result Value Ref Range   Cortisol - AM 9.7 6.7 - 22.6 ug/dL    Comment: Performed at [facility] [address] [place], [place] [address]  Procalcitonin     Status: None   Collection Time: 02/07/22  4:28 AM  Result Value Ref Range   Procalcitonin 7.59 ng/mL    Comment:        Interpretation: PCT > 2 ng/mL: Systemic infection (sepsis) is likely, unless other causes are known. (NOTE)       Sepsis PCT Algorithm           Lower Respiratory Tract                                      Infection PCT Algorithm    ----------------------------     ----------------------------          PCT < 0.25 ng/mL                PCT < 0.10 ng/mL          Strongly encourage             Strongly discourage   discontinuation of antibiotics    initiation of antibiotics    ----------------------------     -----------------------------       PCT 0.25 - 0.50 ng/mL            PCT 0.10 - 0.25 ng/mL               OR       >80% decrease in PCT            Discourage initiation of  antibiotics      Encourage discontinuation           of antibiotics    ----------------------------     -----------------------------         PCT >= 0.50 ng/mL              PCT 0.26 - 0.50 ng/mL               AND       <80% decrease in PCT              Encourage initiation of                                             antibiotics       Encourage continuation           of antibiotics    ----------------------------     -----------------------------        PCT >= 0.50 ng/mL                  PCT > 0.50 ng/mL               AND         increase in PCT                  Strongly encourage                                      initiation of antibiotics    Strongly encourage escalation           of antibiotics                                     -----------------------------                                           PCT <= 0.25 ng/mL                                                 OR                                        > 80% decrease in PCT                                      Discontinue / Do not initiate                                             antibiotics  Performed at [facility], [address], [place], [place] [address]   CBC     Status: Abnormal   Collection Time: 02/07/22  4:28 AM  Result Value Ref Range   WBC 11.6 (H) 4.0 - 10.5 K/uL   RBC 3.36 (L) 3.87 - 5.11 MIL/uL   Hemoglobin 10.1 (L) 12.0 - 15.0 g/dL   HCT 32.2 (L) 36.0 - 46.0 %   MCV 95.8 80.0 - 100.0 fL   MCH 30.1 26.0 - 34.0 pg   MCHC 31.4 30.0 - 36.0 g/dL   RDW 16.7 (H) 11.5  - 15.5 %   Platelets 125 (L) 150 - 400 K/uL   nRBC 0.0 0.0 - 0.2 %    Comment: Performed at [facility], [address] [place], [place] [address]  Basic metabolic panel     Status: Abnormal   Collection Time: 02/07/22  4:28 AM  Result Value Ref Range   Sodium 140 135 - 145 mmol/L   Potassium 4.0 3.5 - 5.1 mmol/L   Chloride 108 98 - 111 mmol/L   CO2 25 22 - 32 mmol/L   Glucose, Bld 169 (H) 70 - 99 mg/dL    Comment: Glucose reference range applies only to samples taken after fasting for at least 8 hours.   BUN 23 8 - 23 mg/dL   Creatinine, Ser 0.67 0.44 - 1.00 mg/dL   Calcium 9.2 8.9 - 10.3 mg/dL   GFR, Estimated >60 >60 mL/min    Comment: (NOTE) Calculated using the CKD-EPI Creatinine Equation (2021)    Anion gap 7 5 - 15    Comment: Performed at [facility], [address] [place], [place] [address]  Hemoglobin A1c     Status: Abnormal   Collection Time: 02/07/22  4:28 AM  Result Value Ref Range   Hgb A1c MFr Bld 6.2 (H) 4.8 - 5.6 %    Comment: (NOTE) Pre diabetes:          5.7%-6.4%  Diabetes:              >6.4%  Glycemic control for   <7.0% adults with diabetes    Mean Plasma Glucose 131.24 mg/dL    Comment: Performed at [facility] [address], [place], [place] [address]  Glucose, capillary     Status: Abnormal   Collection Time: 02/07/22  7:59 AM  Result Value Ref Range   Glucose-Capillary 131 (H) 70 - 99 mg/dL    Comment: Glucose reference range applies only to samples taken after fasting for at least 8 hours.  Glucose, capillary     Status: Abnormal   Collection Time: 02/07/22 12:28 PM  Result Value Ref Range   Glucose-Capillary 108 (H) 70 - 99 mg/dL    Comment: Glucose reference range applies only to samples taken after fasting for at least 8 hours.    Current Facility-Administered Medications  Medication Dose Route Frequency Provider Last Rate Last Admin   acetaminophen (TYLENOL) tablet 650 mg  650 mg Oral Q6H PRN Loletha Grayer, MD   650 mg at 02/07/22 0217   Or   acetaminophen (TYLENOL) suppository 650 mg  650 mg Rectal Q6H PRN Loletha Grayer, MD       acidophilus (RISAQUAD) capsule   Oral Daily Loletha Grayer, MD   1 capsule at 02/08/22 1034   apixaban (ELIQUIS) tablet 2.5 mg  2.5 mg Oral BID Loletha Grayer, MD   2.5 mg at 02/08/22 1034   atorvastatin (LIPITOR) tablet 40 mg  40 mg Oral Daily Loletha Grayer, MD   40 mg at 02/08/22 1034   cefTRIAXone (ROCEPHIN) 2 g in sodium chloride 0.9 %  100 mL IVPB  2 g Intravenous Q24H Loletha Grayer, MD   Stopped at 02/07/22 2328   cholecalciferol (VITAMIN D3) tablet 2,000 Units  2,000 Units Oral Daily Loletha Grayer, MD   2,000 Units at 02/08/22 1034   docusate sodium (COLACE) capsule 100 mg  100 mg Oral Daily Loletha Grayer, MD   100 mg at 02/08/22 1034   haloperidol (HALDOL) tablet 1 mg  1 mg Oral [id number] Chestina Komatsu Edward, DO       haloperidol lactate (HALDOL) injection 1 mg  1 mg Intravenous Q6H PRN Loletha Grayer, MD   1 mg at 02/08/22 0759   loratadine (CLARITIN) tablet 10 mg  10 mg Oral Daily Loletha Grayer, MD   10 mg at 02/08/22 1034   metoprolol succinate (TOPROL-XL) 24 hr tablet 12.5 mg  12.5 mg Oral QHS Wieting, Jaheem Hedgepath, MD   12.5 mg at 02/07/22 2139   ondansetron (ZOFRAN) tablet 4 mg  4 mg Oral Q6H PRN Loletha Grayer, MD       Or   ondansetron [facility] injection 4 mg  4 mg Intravenous Q6H PRN Wieting, Clifford Coudriet, MD       QUEtiapine (SEROQUEL) tablet 50 mg  50 mg Oral QHS Ireanna Finlayson Edward, DO       vitamin B-12 (CYANOCOBALAMIN) tablet 500 mcg  500 mcg Oral Daily Loletha Grayer, MD   500 mcg at 02/08/22 1034    Musculoskeletal: Strength & Muscle Tone: decreased Gait & Station:  Unobserved Patient leans: N/A            Psychiatric Specialty Exam:  Presentation  General Appearance: Appropriate for Environment  Eye Contact:Good  Speech:Clear and Coherent  Speech Volume:Normal  Handedness:No data  recorded  Mood and Affect  Mood:Euthymic  Affect:Congruent   Thought Process  Thought Processes:Disorganized (dementia)  Descriptions of Associations:Loose  Orientation:Partial  Thought Content:Delusions  History of Schizophrenia/Schizoaffective disorder:No  Duration of Psychotic Symptoms:No data recorded Hallucinations:No data recorded Ideas of Reference:None (denies)  Suicidal Thoughts:No data recorded Homicidal Thoughts:No data recorded  Sensorium  Memory:Immediate Poor; Recent Poor  Judgment:Impaired  Insight:Poor   Executive Functions  Concentration:Fair  Attention Span:Fair  [place]   Psychomotor Activity  Psychomotor Activity:No data recorded  Assets  Assets:Financial Resources/Insurance; Housing; Social Support; Resilience   Sleep  Sleep:No data recorded  Physical Exam: Physical Exam Vitals and nursing note reviewed.  Constitutional:      Appearance: Normal appearance. She is normal weight.  Neurological:     General: No focal deficit present.     Mental Status: She is alert and oriented to person, place, and time.  Psychiatric:        Attention and Perception: She is inattentive.        Mood and Affect: Mood and affect normal.        Speech: Speech normal.        Behavior: Behavior normal. Behavior is cooperative.        Thought Content: Thought content normal.        Cognition and Memory: Cognition is impaired. Memory is impaired.        Judgment: Judgment is inappropriate.   Review of Systems  Constitutional: Negative.   HENT: Negative.    Eyes: Negative.   Respiratory: Negative.    Cardiovascular: Negative.   Gastrointestinal: Negative.   Genitourinary: Negative.   Musculoskeletal: Negative.   Skin: Negative.   Neurological: Negative.   Endo/Heme/Allergies: Negative.   Psychiatric/Behavioral:  Positive for hallucinations and  memory loss. The patient has insomnia.   Blood  pressure 121/65, pulse 70, temperature 97.9 F (36.6 C), temperature source Oral, resp. rate 14, height '5\' 3"'$  (1.6 m), weight 53.2 kg, SpO2 100 %. Body mass index is 20.78 kg/m.  Treatment Plan Summary: Daily contact with patient to assess and evaluate symptoms and progress in treatment, Medication management, and Plan discontinue BuSpar.  Discontinue Risperdal.  Discontinue Remeron.  Start Haldol 1 mg twice a day and Seroquel 50 mg at bedtime.  Increase as needed.  Disposition:  As above, continue medical care.  [place], DO 02/08/2022 11:06 AM

## 2022-02-09 DIAGNOSIS — A4151 Sepsis due to Escherichia coli [E. coli]: Secondary | ICD-10-CM | POA: Diagnosis not present

## 2022-02-09 DIAGNOSIS — N3000 Acute cystitis without hematuria: Secondary | ICD-10-CM | POA: Diagnosis not present

## 2022-02-09 DIAGNOSIS — R410 Disorientation, unspecified: Secondary | ICD-10-CM | POA: Diagnosis not present

## 2022-02-09 DIAGNOSIS — E872 Acidosis, unspecified: Secondary | ICD-10-CM | POA: Diagnosis not present

## 2022-02-09 LAB — BASIC METABOLIC PANEL
Anion gap: 7 (ref 5–15)
BUN: 19 mg/dL (ref 8–23)
CO2: 26 mmol/L (ref 22–32)
Calcium: 9.6 mg/dL (ref 8.9–10.3)
Chloride: 108 mmol/L (ref 98–111)
Creatinine, Ser: 0.58 mg/dL (ref 0.44–1.00)
GFR, Estimated: >60 mL/min (ref >60)
Glucose, Bld: 88 mg/dL (ref 70–99)
Potassium: 4.2 mmol/L (ref 3.5–5.1)
Sodium: 141 mmol/L (ref 135–145)

## 2022-02-09 LAB — CBC
HCT: 35.8 % — ABNORMAL LOW (ref 36.0–46.0)
Hemoglobin: 11.2 g/dL — ABNORMAL LOW (ref 12.0–15.0)
MCH: 29.5 pg (ref 26.0–34.0)
MCHC: 31.3 g/dL (ref 30.0–36.0)
MCV: 94.2 fL (ref 80.0–100.0)
Platelets: 137 10*3/uL — ABNORMAL LOW (ref 150–400)
RBC: 3.8 MIL/uL — ABNORMAL LOW (ref 3.87–5.11)
RDW: 16.3 % — ABNORMAL HIGH (ref 11.5–15.5)
WBC: 6.7 10*3/uL (ref 4.0–10.5)
nRBC: 0 % (ref 0.0–0.2)

## 2022-02-09 LAB — CULTURE, BLOOD (ROUTINE X 2): Special Requests: ADEQUATE

## 2022-02-09 LAB — LACTIC ACID, PLASMA: Lactic Acid, Venous: 0.8 mmol/L (ref 0.5–1.9)

## 2022-02-09 MED ORDER — QUETIAPINE FUMARATE 25 MG PO TABS
25.0000 mg | ORAL_TABLET | Freq: Every day | ORAL | Status: DC
Start: 2022-02-09 — End: 2022-02-10
  Administered 2022-02-09: 25 mg via ORAL
  Filled 2022-02-09: qty 1

## 2022-02-09 NOTE — Care Management Important Message (Signed)
Important Message  Patient Details  Name: Holly Rose MRN: [id number] Date of Birth: 14-Apr-1930   Medicare Important Message Given:  Yes     Dannette Barbara 02/09/2022, 10:25 AM

## 2022-02-09 NOTE — Progress Notes (Signed)
Progress Note   Patient: Holly Rose RCV:[id number] DOB: 1929-10-22 DOA: 02/06/2022     3 DOS: the patient was seen and examined on 02/09/2022     Assessment and Plan: * Acute delirium The patient slept last night and still sleeping this morning.  Back on the Seroquel dose down to 25 mg at night.  Continue Haldol twice a day during the day.  The patient did have periods where she was able to answer questions appropriately  Sepsis due to Escherichia coli (E. coli) ([place]) Present on admission.  E. coli growing in blood cultures and urine cultures.  Sensitivities on 1 blood culture are back.  Urine culture sensitivity still pending..  Patient had tachycardia and fever on presentation.  Continue Rocephin.  Likely will switch over to Keflex upon disposition.  Acute cystitis without hematuria Continue Rocephin.  E. coli and Citrobacter growing out of the urine cultures  Lactic acidosis Lactic acid normalized.  Type 2 diabetes mellitus with hyperlipidemia (HCC) Hemoglobin A1c good at 6.2.  Continue cholesterol medication  Atrial fibrillation with rapid ventricular response (HCC) Continue low-dose Toprol-XL at night.  Continue Eliquis for anticoagulation.  Dementia with behavioral disturbance (HCC) Twice a day oral Haldol and Seroquel at night.   Thrombocytopenia ([place]) Likely secondary to sepsis continue to monitor.        Subjective: Patient slept last night and was sleeping this morning.  With the blood draw she we did get a little agitated this morning.  For me she kept her eyes closed and answers some questions and did not offer any complaints.  Physical Exam: Vitals:   02/07/22 2033 02/08/22 1029 02/09/22 0643 02/09/22 0737  BP: 119/72 121/65 118/79 130/64  Pulse: 86 70 85 73  Resp:  14  16  Temp: 98 F (36.7 C) 97.9 F (36.6 C)  98 F (36.7 C)  TempSrc: Oral Oral  Oral  SpO2: 99% 100%  95%  Weight:      Height:       Physical Exam HENT:     Head:  Normocephalic.     Mouth/Throat:     Pharynx: No oropharyngeal exudate.  Eyes:     General: Lids are normal.     Conjunctiva/sclera: Conjunctivae normal.  Cardiovascular:     Rate and Rhythm: Normal rate. Rhythm irregularly irregular.     Heart sounds: Normal heart sounds, S1 normal and S2 normal.  Pulmonary:     Breath sounds: No decreased breath sounds, wheezing, rhonchi or rales.  Abdominal:     Palpations: Abdomen is soft.     Tenderness: There is no abdominal tenderness.  Musculoskeletal:     Right lower leg: No swelling.     Left lower leg: No swelling.  Skin:    General: Skin is warm.     Findings: No rash.     Comments: Bruising lower extremities.  Neurological:     Mental Status: She is lethargic.     Comments: Answers questions appropriately.     Data Reviewed: E. coli and Citrobacter growing out of urine culture.  Sensitivities pending E. coli growing out of blood cultures.  Sensitivities back on one of the bottles. Lactic acid is 0.8, white blood cell count 6.7, platelet count up to 137, hemoglobin 11.2  Family Communication: Spoke with daughter-in-law on phone  Disposition: Status is: Inpatient Remains inpatient appropriate because: Her facility will not take back on the holiday Monday.  We will cut back on Seroquel dose tonight since sleeping this  morning. Planned Discharge Destination: Her memory care facility  Author: Loletha Grayer, MD 02/09/2022 12:39 PM  For on call review [email or web address].

## 2022-02-09 NOTE — TOC Progression Note (Signed)
Transition of Care [facility]) - Progression Note    Patient Details  Name: KILYN MARAGH MRN: [id number] Date of Birth: 1930/03/25  Transition of Care [facility]) CM/SW [place], RN Phone Number: 02/09/2022, 12:21 PM  Clinical Narrative:  As per village of [place], they are not able to accommodate patient's return today.  They will need to wait until tomorrow when pharmacy and other departements return.     Expected Discharge Plan: Memory Care Barriers to Discharge: Continued Medical Work up  Expected Discharge Plan and Services Expected Discharge Plan: Memory Care       Living arrangements for the past 2 months: Single Family Home                                       Social Determinants of Health (SDOH) Interventions    Readmission Risk Interventions    02/26/2021   10:04 AM 02/22/2021    3:33 PM  Readmission Risk Prevention Plan  Transportation Screening Complete Complete  PCP or Specialist Appt within 3-5 Days  Complete  HRI or [place]  Complete  Social Work Consult for [place] Planning/Counseling  Complete  Palliative Care Screening  Complete  Medication Review Press photographer) Complete Complete  HRI or [place] Complete   [place] Not Applicable

## 2022-02-10 DIAGNOSIS — N3000 Acute cystitis without hematuria: Secondary | ICD-10-CM | POA: Diagnosis not present

## 2022-02-10 DIAGNOSIS — E872 Acidosis, unspecified: Secondary | ICD-10-CM | POA: Diagnosis not present

## 2022-02-10 DIAGNOSIS — R410 Disorientation, unspecified: Secondary | ICD-10-CM | POA: Diagnosis not present

## 2022-02-10 DIAGNOSIS — A4151 Sepsis due to Escherichia coli [E. coli]: Secondary | ICD-10-CM | POA: Diagnosis not present

## 2022-02-10 LAB — URINE CULTURE
Culture: >=100000 — AB
Special Requests: NORMAL

## 2022-02-10 MED ORDER — CEPHALEXIN 500 MG PO CAPS: 500.0000 mg | ORAL_CAPSULE | Freq: Three times a day (TID) | ORAL | 0 refills | Status: AC

## 2022-02-10 MED ORDER — CEPHALEXIN 500 MG PO CAPS: 500.0000 mg | ORAL_CAPSULE | Freq: Three times a day (TID) | ORAL | Status: DC

## 2022-02-10 MED ORDER — HALOPERIDOL 1 MG PO TABS: 1.0000 mg | ORAL_TABLET | ORAL | 0 refills | Status: AC

## 2022-02-10 MED ORDER — METOPROLOL SUCCINATE ER 25 MG PO TB24: 12.5000 mg | ORAL_TABLET | Freq: Every day | ORAL | 0 refills | Status: AC

## 2022-02-10 MED ORDER — QUETIAPINE FUMARATE 25 MG PO TABS: ORAL_TABLET | ORAL | 0 refills | Status: AC

## 2022-02-10 NOTE — Progress Notes (Signed)
[facility]) Hospital Liaison note:  This patient is currently enrolled in [facility] outpatient-based Palliative Care. Will continue to follow for disposition.  Please call with any outpatient palliative questions or concerns.  Thank you, Lorelee Market, LPN [facility] Liaison [phone]

## 2022-02-10 NOTE — NC FL2 (Signed)
[place] LEVEL OF CARE SCREENING TOOL     IDENTIFICATION  Patient Name: Holly Rose Birthdate: [birth year] Sex: female Admission Date (Current Location): 02/06/2022  [facility] and [place] Number:  Engineering geologist and Address:  [facility], [address] [place],  [address]      Provider Number: [id number]  Attending Physician Name and Address:  Loletha Grayer, MD  Relative Name and Phone Number:  [place], [place] (Relative)   [phone] (Mobile)    Current Level of Care: Hospital Recommended Level of Care: Memory Care Prior Approval Number:    Date Approved/Denied:   PASRR Number:    Discharge Plan: Other (Comment) (memory care)    Current Diagnoses: Patient Active Problem List   Diagnosis Date Noted   Acute delirium 02/08/2022   Sepsis due to Escherichia coli (E. coli) ([place]) 02/07/2022   Thrombocytopenia ([place]) 02/07/2022   Sepsis ([place]) 02/06/2022   Acute cystitis without hematuria 02/06/2022   Lactic acidosis 02/06/2022   DNR (do not resuscitate)    Dementia with behavioral disturbance ([place]) 02/03/2022   Foot ulcer ([place]) 09/12/2021   Aortic atherosclerosis ([place]) 05/11/2021   Emphysema lung ([place]) 05/11/2021   Choledocholithiasis    Acute pancreatitis 02/19/2021   Cholelithiasis    Osteoporosis 01/22/2021   Pancreatitis, acute 12/25/2020   Pancreatitis 12/25/2020   Dementia without behavioral disturbance ([place])    Stroke ([place]) 07/22/2020   TIA (transient ischemic attack) 07/22/2020   Depression, major, single episode, mild ([place]) 07/22/2020   Visual hallucinations 07/21/2020   NSTEMI (non-ST elevated myocardial infarction) ([place]) 04/17/2020   Atrial fibrillation with RVR ([place]) 04/17/2020   Change in mental status 04/15/2020   Head injury 04/15/2020   Dysuria 04/30/2019   Frequent urinary tract infections 04/30/2019   Age-related osteoporosis without current pathological fracture 03/01/2019   Dizziness  08/29/2018   UTI (urinary tract infection) 08/15/2018   Vitamin D deficiency, unspecified 06/16/2017   Hypercalcinuria 06/14/2017   SCC (squamous cell carcinoma), face 03/29/2017   History of kidney stones 03/12/2017   Breast cancer, left ([place]) 12/28/2016   Chronic cystitis 11/05/2016   Nephrolithiasis 57/32/2025   Renal colic 42/70/6237   Urge incontinence 11/05/2016   Bilateral hand pain 11/03/2016   Generalized osteoarthritis of hand 11/03/2016   Numbness and tingling in both hands 11/03/2016   Bradycardia 04/15/2016   History of colonic polyps 09/16/2015   Abdominal pain 08/18/2015   Rectal bleeding 08/18/2015   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   Atrial fibrillation with rapid ventricular response ([place]) 02/17/2015   Diabetes mellitus with neuropathy ([place]) 02/17/2015   Hyperlipidemia 02/17/2015   Iron deficiency anemia 02/17/2015   Macular degeneration 02/17/2015   Stress 02/17/2015   Health care maintenance 02/17/2015   Diarrhea 02/17/2015   Type 2 diabetes mellitus with hyperlipidemia ([place]) 07/13/2014   Diverticulosis 03/31/2013   GI bleeding 03/31/2013   Mitral valve prolapse 03/31/2013   Diverticulosis of intestine without perforation or abscess without bleeding 03/31/2013    Orientation RESPIRATION BLADDER Height & Weight     Self  Normal Incontinent Weight: 53.2 kg Height:  '5\' 3"'$  (160 cm)  BEHAVIORAL SYMPTOMS/MOOD NEUROLOGICAL BOWEL NUTRITION STATUS      Incontinent Diet (regular)  AMBULATORY STATUS COMMUNICATION OF NEEDS Skin     Verbally Normal                       Personal Care Assistance Level of Assistance  Bathing, Feeding, Dressing  Bathing Assistance: Limited assistance Feeding assistance: Limited assistance Dressing Assistance: Limited assistance     Functional Limitations Info  Sight, Hearing, Speech Sight Info: Impaired Hearing Info: Adequate Speech Info: Adequate    SPECIAL CARE FACTORS FREQUENCY                        Contractures Contractures Info: Not present    Additional Factors Info  Code Status, Allergies Code Status Info: DNR Allergies Info: Lyrica (pregabalin) Not Specified Intolerance Swelling Sleepy, does not eat  Neurontin (gabapentin) Not Specified Intolerance Nausea And Vomiting, Other (See Comments) disoriented  Bactrim (sulfamethoxazole-trimethoprim)           Current Medications (02/10/2022):  This is the current hospital active medication list Current Facility-Administered Medications  Medication Dose Route Frequency Provider Last Rate Last Admin   acetaminophen (TYLENOL) tablet 650 mg  650 mg Oral Q6H PRN Loletha Grayer, MD   650 mg at 02/07/22 0217   Or   acetaminophen (TYLENOL) suppository 650 mg  650 mg Rectal Q6H PRN Loletha Grayer, MD       acidophilus (RISAQUAD) capsule   Oral Daily Loletha Grayer, MD   1 capsule at 02/10/22 0931   apixaban (ELIQUIS) tablet 2.5 mg  2.5 mg Oral BID Loletha Grayer, MD   2.5 mg at 02/10/22 0931   atorvastatin (LIPITOR) tablet 40 mg  40 mg Oral Daily Loletha Grayer, MD   40 mg at 02/10/22 0931   cephALEXin (KEFLEX) capsule 500 mg  500 mg Oral Q8H Wieting, Richard, MD       cholecalciferol (VITAMIN D3) tablet 2,000 Units  2,000 Units Oral Daily Loletha Grayer, MD   2,000 Units at 02/10/22 0931   docusate sodium (COLACE) capsule 100 mg  100 mg Oral Daily Loletha Grayer, MD   100 mg at 02/10/22 0931   haloperidol (HALDOL) tablet 1 mg  1 mg Oral [id number] [place], DO   1 mg at 02/10/22 9532   haloperidol lactate (HALDOL) injection 1 mg  1 mg Intravenous Q6H PRN Loletha Grayer, MD   1 mg at 02/09/22 2247   loratadine (CLARITIN) tablet 10 mg  10 mg Oral Daily Loletha Grayer, MD   10 mg at 02/10/22 0931   metoprolol succinate (TOPROL-XL) 24 hr tablet 12.5 mg  12.5 mg Oral QHS Wieting, Richard, MD   12.5 mg at 02/09/22 2105   ondansetron (ZOFRAN) tablet 4 mg  4 mg Oral Q6H PRN Loletha Grayer, MD        Or   ondansetron [facility]) injection 4 mg  4 mg Intravenous Q6H PRN Wieting, Richard, MD       QUEtiapine (SEROQUEL) tablet 25 mg  25 mg Oral QHS Loletha Grayer, MD   25 mg at 02/09/22 2105   vitamin B-12 (CYANOCOBALAMIN) tablet 500 mcg  500 mcg Oral Daily Loletha Grayer, MD   500 mcg at 02/10/22 0233     Discharge Medications: Please see discharge summary for a list of discharge medications.  Relevant Imaging Results:  Relevant Lab Results:   Additional Information [id number]  Pete Pelt, RN

## 2022-02-10 NOTE — Discharge Summary (Signed)
Physician Discharge Summary   Patient: Holly Rose MRN: [id number] DOB: [birth year]  Admit date:     02/06/2022  Discharge date: 02/10/22  Discharge Physician: Loletha Grayer   PCP: Kirk Ruths, MD   Recommendations at discharge:   Follow-up PCP 5 days  Discharge Diagnoses: Principal Problem:   Acute delirium Active Problems:   Sepsis due to Escherichia coli (E. coli) (HCC)   Acute cystitis without hematuria   Lactic acidosis   Type 2 diabetes mellitus with hyperlipidemia (HCC)   Atrial fibrillation with rapid ventricular response (HCC)   Dementia with behavioral disturbance (HCC)   Sepsis (HCC)   Atrial fibrillation with RVR (HCC)   Thrombocytopenia [facility])   Hospital Course: 88 year old female with past medical history of breast cancer, atrial fibrillation, type 2 diabetes mellitus, macular degeneration, pacemaker, TIA.  She was admitted to the hospital on 02/06/2022 with fever and admitted for clinical sepsis.  Patient was started on IV Rocephin.  Blood cultures and urine cultures growing E. coli.  Urine culture also growing Citrobacter.  The patient will be switched over to Keflex for total of 7-day course.  The patient received 4 doses of Rocephin here in the hospital.  With her dementia she also had acute delirium and her medications needed to be changed to Seroquel at night and Haldol during the day.  The patient slept well on the night of 02/08/2022 with high-dose Seroquel.  We will prescribe Seroquel 25 mg at night but can give an extra half dose if not sleeping with the 25 mg.  The patient is a DO NOT RESUSCITATE.  Patient clinically stable for discharge on 02/10/2022.  Assessment and Plan: * Acute delirium The patient slept 2 nights ago with high-dose Seroquel but was groggy in the morning.  Dose decreased down to 25 mg she slept some last night.  I will prescribe 25 mg of Seroquel at night but can take an extra half pill (12.5 mg if not sleeping).  Explained black  box warning on these medications to daughter-in-law and she is willing to undergo the risk in order for the patient to sleep.  Sepsis due to Escherichia coli (E. coli) ([place]) Present on admission.  E. coli growing in blood cultures and urine cultures.  Sensitivities are back and sensitive to the Rocephin that she received here in the hospital.  The patient received 4 doses of Rocephin here in the hospital.  We will give another 3 days of Keflex upon discharge.  Acute cystitis without hematuria Continue Rocephin.  E. coli and Citrobacter growing out of the urine cultures, both sensitive to Rocephin and Keflex.  Lactic acidosis Lactic acid normalized.  Type 2 diabetes mellitus with hyperlipidemia (HCC) Hemoglobin A1c good at 6.2.  Continue cholesterol medication.  No need for diabetic medication.  Atrial fibrillation with rapid ventricular response (HCC) Continue low-dose Toprol-XL at night.  Continue Eliquis for anticoagulation.  Dementia with behavioral disturbance (HCC) Twice a day oral Haldol and Seroquel at night.   Thrombocytopenia ([place]) Likely secondary to sepsis continue to monitor.  Recommend checking as outpatient in 2 weeks.         Consultants: Psychiatry Procedures performed: None Disposition: Back to memory care unit Diet recommendation:  Regular diet DISCHARGE MEDICATION: Allergies as of 02/10/2022       Reactions   Lyrica [pregabalin] Swelling   Sleepy, does not eat   Neurontin [gabapentin] Nausea And Vomiting, Other (See Comments)   disoriented   Bactrim [sulfamethoxazole-trimethoprim]    Made  her dizziness        Medication List     STOP taking these medications    busPIRone 7.5 MG tablet Commonly known as: BUSPAR   divalproex 250 MG 24 hr tablet Commonly known as: DEPAKOTE ER   divalproex 500 MG 24 hr tablet Commonly known as: DEPAKOTE ER   fluconazole 100 MG tablet Commonly known as: DIFLUCAN   furosemide 20 MG tablet Commonly known  as: LASIX   metoprolol tartrate 25 MG tablet Commonly known as: LOPRESSOR   mirtazapine 15 MG tablet Commonly known as: REMERON       TAKE these medications    acetaminophen 325 MG tablet Commonly known as: TYLENOL Take 2 tablets (650 mg total) by mouth every 6 (six) hours as needed for mild pain (or Fever >/= 101).   apixaban 2.5 MG Tabs tablet Commonly known as: ELIQUIS Take 1 tablet (2.5 mg total) by mouth 2 (two) times daily.   atorvastatin 40 MG tablet Commonly known as: LIPITOR Take 1 tablet (40 mg total) by mouth daily.   cephALEXin 500 MG capsule Commonly known as: KEFLEX Take 1 capsule (500 mg total) by mouth every 8 (eight) hours.   cetirizine 10 MG tablet Commonly known as: ZYRTEC Take 10 mg by mouth daily.   docusate sodium 100 MG capsule Commonly known as: COLACE Take 100 mg by mouth daily.   haloperidol 1 MG tablet Commonly known as: HALDOL Take 1 tablet (1 mg total) by mouth 2 (two) times daily at 8 am and 4 pm.   isosorbide mononitrate 30 MG 24 hr tablet Commonly known as: IMDUR Take 1 tablet (30 mg total) by mouth daily.   meclizine 12.5 MG tablet Commonly known as: ANTIVERT Take 12.5 mg by mouth 3 (three) times daily as needed for dizziness.   metoprolol succinate 25 MG 24 hr tablet Commonly known as: TOPROL-XL Take 0.5 tablets (12.5 mg total) by mouth at bedtime.   PROBIOTIC DAILY PO Take 1 capsule by mouth daily.   QUEtiapine 25 MG tablet Commonly known as: SEROQUEL Take one capsule '25mg'$  po nightly  (Can give an extra half pill if not sleeping)   tamoxifen 20 MG tablet Commonly known as: NOLVADEX Take 1 tablet by mouth once daily   vitamin B-12 500 MCG tablet Commonly known as: CYANOCOBALAMIN Take 500 mcg by mouth 2 (two) times a day.   Vitamin D 50 MCG (2000 UT) Caps Take 2,000 Units by mouth daily.        Follow-up Information     Kirk Ruths, MD Follow up in 5 day(s).   Specialty: Internal Medicine Contact  information: [place] [address] [phone]                Discharge Exam: Danley Danker Weights   02/06/22 1605 02/06/22 2120  Weight: 56 kg 53.2 kg   Physical Exam HENT:     Head: Normocephalic.     Mouth/Throat:     Pharynx: No oropharyngeal exudate.  Eyes:     General: Lids are normal.     Conjunctiva/sclera: Conjunctivae normal.  Cardiovascular:     Rate and Rhythm: Normal rate. Rhythm irregularly irregular.     Heart sounds: Normal heart sounds, S1 normal and S2 normal.  Pulmonary:     Breath sounds: No decreased breath sounds, wheezing, rhonchi or rales.  Abdominal:     Palpations: Abdomen is soft.     Tenderness: There is no abdominal  tenderness.  Musculoskeletal:     Right lower leg: No swelling.     Left lower leg: No swelling.  Skin:    General: Skin is warm.     Findings: No rash.     Comments: Bruising lower extremities.  Neurological:     Mental Status: She is lethargic.     Comments: Answers questions appropriately.  Able to straight leg raise to command.     Condition at discharge: fair  The results of significant diagnostics from this hospitalization (including imaging, microbiology, ancillary and laboratory) are listed below for reference.   Imaging Studies: DG Chest Port 1 View  Result Date: 02/06/2022 CLINICAL DATA:  Sepsis. EXAM: PORTABLE CHEST 1 VIEW COMPARISON:  04/16/2020 FINDINGS: Single lead pacer tip: Right ventricle. Atherosclerotic calcification of the aortic arch. The lungs appear clear. There is evidence of calcific rotator cuff tendinopathy. No blunting of the costophrenic angles. IMPRESSION: No acute thoracic findings. 1.  Aortic Atherosclerosis (ICD10-I70.0). 2. Suspected calcific rotator cuff tendinopathy bilaterally. Electronically Signed   By: Van Clines M.D.   On: 02/06/2022 16:57    Microbiology: Results for orders placed or performed during the hospital encounter of  02/06/22  Blood Culture (routine x 2)     Status: Abnormal   Collection Time: 02/06/22  4:09 PM   Specimen: BLOOD LEFT HAND  Result Value Ref Range Status   Specimen Description   Final    BLOOD LEFT HAND Performed at [facility], [address], [place], [place] [address]    Special Requests   Final    BOTTLES DRAWN AEROBIC AND ANAEROBIC Blood Culture results may not be optimal due to an inadequate volume of blood received in culture bottles Performed at [facility], [address] [place], [place] [address]    Culture  Setup Time   Final    GRAM NEGATIVE RODS IN BOTH AEROBIC AND ANAEROBIC BOTTLES Organism ID to follow CRITICAL RESULT CALLED TO, READ BACK BY AND VERIFIED WITH: JASON ROBBINS '@0634'$  02/07/22 MJU Performed at [facility], [address] [place], [place] [address]    Culture ESCHERICHIA COLI (A)  Final   Report Status 02/09/2022 FINAL  Final   Organism ID, Bacteria ESCHERICHIA COLI  Final      Susceptibility   Escherichia coli - MIC*    AMPICILLIN >=32 RESISTANT Resistant     CEFAZOLIN <=4 SENSITIVE Sensitive     CEFEPIME <=0.12 SENSITIVE Sensitive     CEFTAZIDIME <=1 SENSITIVE Sensitive     CEFTRIAXONE <=0.25 SENSITIVE Sensitive     CIPROFLOXACIN <=0.25 SENSITIVE Sensitive     GENTAMICIN <=1 SENSITIVE Sensitive     IMIPENEM <=0.25 SENSITIVE Sensitive     TRIMETH/SULFA <=20 SENSITIVE Sensitive     AMPICILLIN/SULBACTAM >=32 RESISTANT Resistant     PIP/TAZO <=4 SENSITIVE Sensitive     * ESCHERICHIA COLI  Blood Culture ID Panel (Reflexed)     Status: Abnormal   Collection Time: 02/06/22  4:09 PM  Result Value Ref Range Status   Enterococcus faecalis NOT DETECTED NOT DETECTED Final   Enterococcus Faecium NOT DETECTED NOT DETECTED Final   Listeria monocytogenes NOT DETECTED NOT DETECTED Final   Staphylococcus species NOT DETECTED NOT DETECTED Final   Staphylococcus aureus (BCID) NOT DETECTED NOT DETECTED Final   Staphylococcus  epidermidis NOT DETECTED NOT DETECTED Final   Staphylococcus lugdunensis NOT DETECTED NOT DETECTED Final   Streptococcus species NOT DETECTED NOT DETECTED Final   Streptococcus agalactiae NOT DETECTED NOT DETECTED  Final   Streptococcus pneumoniae NOT DETECTED NOT DETECTED Final   Streptococcus pyogenes NOT DETECTED NOT DETECTED Final   A.calcoaceticus-baumannii NOT DETECTED NOT DETECTED Final   Bacteroides fragilis NOT DETECTED NOT DETECTED Final   Enterobacterales DETECTED (A) NOT DETECTED Final    Comment: Enterobacterales represent a large order of gram negative bacteria, not a single organism. CRITICAL RESULT CALLED TO, READ BACK BY AND VERIFIED WITH: JASON ROBBINS '@0634'$  02/07/22 MJU    Enterobacter cloacae complex NOT DETECTED NOT DETECTED Final   Escherichia coli DETECTED (A) NOT DETECTED Final    Comment: CRITICAL RESULT CALLED TO, READ BACK BY AND VERIFIED WITH: JASON ROBBINS '@0634'$  02/07/22 MJU    Klebsiella aerogenes NOT DETECTED NOT DETECTED Final   Klebsiella oxytoca NOT DETECTED NOT DETECTED Final   Klebsiella pneumoniae NOT DETECTED NOT DETECTED Final   Proteus species NOT DETECTED NOT DETECTED Final   Salmonella species NOT DETECTED NOT DETECTED Final   Serratia marcescens NOT DETECTED NOT DETECTED Final   Haemophilus influenzae NOT DETECTED NOT DETECTED Final   Neisseria meningitidis NOT DETECTED NOT DETECTED Final   Pseudomonas aeruginosa NOT DETECTED NOT DETECTED Final   Stenotrophomonas maltophilia NOT DETECTED NOT DETECTED Final   Candida albicans NOT DETECTED NOT DETECTED Final   Candida auris NOT DETECTED NOT DETECTED Final   Candida glabrata NOT DETECTED NOT DETECTED Final   Candida krusei NOT DETECTED NOT DETECTED Final   Candida parapsilosis NOT DETECTED NOT DETECTED Final   Candida tropicalis NOT DETECTED NOT DETECTED Final   Cryptococcus neoformans/gattii NOT DETECTED NOT DETECTED Final   CTX-M ESBL NOT DETECTED NOT DETECTED Final   Carbapenem resistance  IMP NOT DETECTED NOT DETECTED Final   Carbapenem resistance KPC NOT DETECTED NOT DETECTED Final   Carbapenem resistance NDM NOT DETECTED NOT DETECTED Final   Carbapenem resist OXA 48 LIKE NOT DETECTED NOT DETECTED Final   Carbapenem resistance VIM NOT DETECTED NOT DETECTED Final    Comment: Performed at [facility], [address] [place], [place] [address]  Resp Panel by RT-PCR (Flu A&B, Covid) Anterior Nasal Swab     Status: None   Collection Time: 02/06/22  4:28 PM   Specimen: Anterior Nasal Swab  Result Value Ref Range Status   SARS Coronavirus 2 by RT PCR NEGATIVE NEGATIVE Final    Comment: (NOTE) SARS-CoV-2 target nucleic acids are NOT DETECTED.  The SARS-CoV-2 RNA is generally detectable in upper respiratory specimens during the acute phase of infection. The lowest concentration of SARS-CoV-2 viral copies this assay can detect is 138 copies/mL. A negative result does not preclude SARS-Cov-2 infection and should not be used as the sole basis for treatment or other patient management decisions. A negative result may occur with  improper specimen collection/handling, submission of specimen other than nasopharyngeal swab, presence of viral mutation(s) within the areas targeted by this assay, and inadequate number of viral copies(<138 copies/mL). A negative result must be combined with clinical observations, patient history, and epidemiological information. The expected result is Negative.  Fact Sheet for Patients:  EntrepreneurPulse.com.au  Fact Sheet for Healthcare Providers:  IncredibleEmployment.be  This test is no t yet approved or cleared by the [place] FDA and  has been authorized for detection and/or diagnosis of SARS-CoV-2 by FDA under an Emergency Use Authorization (EUA). This EUA will remain  in effect (meaning this test can be used) for the duration of the COVID-19 declaration under Section 564(b)(1) of the Act,  21 U.S.C.section 360bbb-3(b)(1), unless the authorization is terminated  or revoked sooner.       Influenza A by PCR NEGATIVE NEGATIVE Final   Influenza B by PCR NEGATIVE NEGATIVE Final    Comment: (NOTE) The Xpert Xpress SARS-CoV-2/FLU/RSV plus assay is intended as an aid in the diagnosis of influenza from Nasopharyngeal swab specimens and should not be used as a sole basis for treatment. Nasal washings and aspirates are unacceptable for Xpert Xpress SARS-CoV-2/FLU/RSV testing.  Fact Sheet for Patients: EntrepreneurPulse.com.au  Fact Sheet for Healthcare Providers: IncredibleEmployment.be  This test is not yet approved or cleared by the [place] FDA and has been authorized for detection and/or diagnosis of SARS-CoV-2 by FDA under an Emergency Use Authorization (EUA). This EUA will remain in effect (meaning this test can be used) for the duration of the COVID-19 declaration under Section 564(b)(1) of the Act, 21 U.S.C. section 360bbb-3(b)(1), unless the authorization is terminated or revoked.  Performed at [facility], [address] [place], [place] [address]   Blood Culture (routine x 2)     Status: Abnormal   Collection Time: 02/06/22  4:28 PM   Specimen: BLOOD RIGHT FOREARM  Result Value Ref Range Status   Specimen Description   Final    BLOOD RIGHT FOREARM Performed at [facility], [address], [place], [place] [address]    Special Requests   Final    BOTTLES DRAWN AEROBIC AND ANAEROBIC Blood Culture adequate volume Performed at [facility], [address] [place], [place] [address]    Culture  Setup Time   Final    GRAM NEGATIVE RODS IN BOTH AEROBIC AND ANAEROBIC BOTTLES CRITICAL RESULT CALLED TO, READ BACK BY AND VERIFIED WITH: JASON ROBBINS '@0634'$  02/07/22 MJU Performed at [facility], [address] [place], [place] [address]    Culture (A)  Final    ESCHERICHIA  COLI SUSCEPTIBILITIES PERFORMED ON PREVIOUS CULTURE WITHIN THE LAST 5 DAYS. Performed at [facility], [place] [address], [place], [place] [address]    Report Status 02/09/2022 FINAL  Final  Urine Culture     Status: Abnormal   Collection Time: 02/06/22  6:14 PM   Specimen: Urine, Clean Catch  Result Value Ref Range Status   Specimen Description   Final    URINE, CLEAN CATCH Performed at [facility], [address], [place], [place] [address]    Special Requests   Final    Normal Performed at [facility], [place], [place] [address]    Culture (A)  Final    >=100,000 COLONIES/mL CITROBACTER KOSERI >=100,000 COLONIES/mL ESCHERICHIA COLI    Report Status 02/10/2022 FINAL  Final   Organism ID, Bacteria CITROBACTER KOSERI (A)  Final   Organism ID, Bacteria ESCHERICHIA COLI (A)  Final      Susceptibility   Citrobacter koseri - MIC*    CEFAZOLIN <=4 SENSITIVE Sensitive     CEFEPIME <=0.12 SENSITIVE Sensitive     CEFTRIAXONE <=0.25 SENSITIVE Sensitive     CIPROFLOXACIN <=0.25 SENSITIVE Sensitive     GENTAMICIN <=1 SENSITIVE Sensitive     IMIPENEM <=0.25 SENSITIVE Sensitive     NITROFURANTOIN 32 SENSITIVE Sensitive     TRIMETH/SULFA <=20 SENSITIVE Sensitive     PIP/TAZO 8 SENSITIVE Sensitive     * >=100,000 COLONIES/mL CITROBACTER KOSERI   Escherichia coli - MIC*    AMPICILLIN >=32 RESISTANT Resistant     CEFAZOLIN <=4 SENSITIVE Sensitive     CEFEPIME <=0.12 SENSITIVE Sensitive  CEFTRIAXONE <=0.25 SENSITIVE Sensitive     CIPROFLOXACIN <=0.25 SENSITIVE Sensitive     GENTAMICIN <=1 SENSITIVE Sensitive     IMIPENEM <=0.25 SENSITIVE Sensitive     NITROFURANTOIN <=16 SENSITIVE Sensitive     TRIMETH/SULFA <=20 SENSITIVE Sensitive     AMPICILLIN/SULBACTAM 16 INTERMEDIATE Intermediate     PIP/TAZO <=4 SENSITIVE Sensitive     * >=100,000 COLONIES/mL ESCHERICHIA COLI    Labs: CBC: Recent Labs  Lab 02/03/22 1101 02/06/22 1628  02/07/22 0428 02/09/22 0804  WBC 3.6* 7.5 11.6* 6.7  NEUTROABS  --  6.9  --   --   HGB 10.8* 11.0* 10.1* 11.2*  HCT 35.5* 35.0* 32.2* 35.8*  MCV 97.0 94.3 95.8 94.2  PLT 164 179 125* 782*   Basic Metabolic Panel: Recent Labs  Lab 02/03/22 1101 02/06/22 1628 02/07/22 0428 02/09/22 0804  NA 138 137 140 141  K 4.1 4.5 4.0 4.2  CL 109 103 108 108  CO2 '22 24 25 26  '$ GLUCOSE 189* 109* 169* 88  BUN '19 23 23 19  '$ CREATININE 0.81 0.80 0.67 0.58  CALCIUM 9.1 9.7 9.2 9.6   Liver Function Tests: Recent Labs  Lab 02/03/22 1101 02/06/22 1628  AST 26 27  ALT 16 15  ALKPHOS 35* 47  BILITOT 0.5 0.8  PROT 6.8 7.3  ALBUMIN 2.7* 2.8*   CBG: Recent Labs  Lab 02/06/22 2216 02/07/22 0759 02/07/22 1228  GLUCAP 184* 131* 108*    Discharge time spent: greater than 30 minutes.  Signed: Loletha Grayer, MD [facility] 02/10/2022

## 2022-02-10 NOTE — TOC Progression Note (Signed)
Transition of Care [facility]) - Progression Note    Patient Details  Name: Holly Rose MRN: [id number] Date of Birth: [birth year]  [facility] of [facility] CM/SW Contact  Pete Pelt, RN Phone Number: 02/10/2022, 11:53 AM  Clinical Narrative:   Patient discharging to [facility] today  Facility will transport     Expected Discharge Plan: Memory Care Barriers to Discharge: Continued Medical Work up  Expected Discharge Plan and Services Expected Discharge Plan: [place] arrangements for the past 2 months: Single Family Home Expected Discharge Date: 02/10/22                                     Social Determinants of Health (SDOH) Interventions    Readmission Risk Interventions    02/26/2021   10:04 AM 02/22/2021    3:33 PM  Readmission Risk Prevention Plan  Transportation Screening Complete Complete  PCP or Specialist Appt within 3-5 Days  Complete  HRI or [place]  Complete  Social Work Consult for [place] Planning/Counseling  Complete  Palliative Care Screening  Complete  Medication Review Press photographer) Complete Complete  HRI or [place] Complete   [place] Not Applicable

## 2022-02-10 NOTE — Consult Note (Signed)
[facility] CM Inpatient Consult   02/10/2022  Aldina A Hetzer [birth year] [id number]  [place] [facility] CM)   Patient chart has been reviewed with noted high risk score for unplanned readmissions.  Patient assessed for community [place] Management follow up needs. Per review, patient to return to [facility] [place]. No [facility] CM needs.  Of note, [facility] Care Management services does not replace or interfere with any services that are arranged by inpatient case management or social work.    Netta Cedars, MSN, RN [facility] Liaison Toll free office [phone]

## 2022-02-10 NOTE — Progress Notes (Signed)
Pt d/c home via [place] transportation. IV removed intact. VSS. Spoke with RN Tabitha via phone and went over education. All belongings sent with pt.

## 2022-02-11 DIAGNOSIS — F03918 Unspecified dementia, unspecified severity, with other behavioral disturbance: Secondary | ICD-10-CM | POA: Diagnosis not present

## 2022-02-11 DIAGNOSIS — E1122 Type 2 diabetes mellitus with diabetic chronic kidney disease: Secondary | ICD-10-CM | POA: Diagnosis not present

## 2022-02-11 DIAGNOSIS — Z593 Problems related to living in residential institution: Secondary | ICD-10-CM | POA: Diagnosis not present

## 2022-02-11 DIAGNOSIS — N183 Chronic kidney disease, stage 3 unspecified: Secondary | ICD-10-CM | POA: Diagnosis not present

## 2022-02-12 DIAGNOSIS — F01C2 Vascular dementia, severe, with psychotic disturbance: Secondary | ICD-10-CM | POA: Diagnosis not present

## 2022-02-12 DIAGNOSIS — F419 Anxiety disorder, unspecified: Secondary | ICD-10-CM | POA: Diagnosis not present

## 2022-03-03 DIAGNOSIS — I495 Sick sinus syndrome: Secondary | ICD-10-CM | POA: Diagnosis not present

## 2022-03-03 DIAGNOSIS — Z950 Presence of cardiac pacemaker: Secondary | ICD-10-CM | POA: Diagnosis not present

## 2022-03-03 DIAGNOSIS — Z593 Problems related to living in residential institution: Secondary | ICD-10-CM | POA: Diagnosis not present

## 2022-03-03 DIAGNOSIS — E1122 Type 2 diabetes mellitus with diabetic chronic kidney disease: Secondary | ICD-10-CM | POA: Diagnosis not present

## 2022-03-03 DIAGNOSIS — F3340 Major depressive disorder, recurrent, in remission, unspecified: Secondary | ICD-10-CM | POA: Diagnosis not present

## 2022-03-03 DIAGNOSIS — E785 Hyperlipidemia, unspecified: Secondary | ICD-10-CM | POA: Diagnosis not present

## 2022-03-03 DIAGNOSIS — I4811 Longstanding persistent atrial fibrillation: Secondary | ICD-10-CM | POA: Diagnosis not present

## 2022-03-03 DIAGNOSIS — I25110 Atherosclerotic heart disease of native coronary artery with unstable angina pectoris: Secondary | ICD-10-CM | POA: Diagnosis not present

## 2022-03-03 DIAGNOSIS — R279 Unspecified lack of coordination: Secondary | ICD-10-CM | POA: Diagnosis not present

## 2022-03-03 DIAGNOSIS — N183 Chronic kidney disease, stage 3 unspecified: Secondary | ICD-10-CM | POA: Diagnosis not present

## 2022-03-03 DIAGNOSIS — F03918 Unspecified dementia, unspecified severity, with other behavioral disturbance: Secondary | ICD-10-CM | POA: Diagnosis not present

## 2022-03-03 DIAGNOSIS — R296 Repeated falls: Secondary | ICD-10-CM | POA: Diagnosis not present

## 2022-03-03 DIAGNOSIS — Z9181 History of falling: Secondary | ICD-10-CM | POA: Diagnosis not present

## 2022-03-03 DIAGNOSIS — Z853 Personal history of malignant neoplasm of breast: Secondary | ICD-10-CM | POA: Diagnosis not present

## 2022-03-03 DIAGNOSIS — M6281 Muscle weakness (generalized): Secondary | ICD-10-CM | POA: Diagnosis not present

## 2022-03-03 DIAGNOSIS — M791 Myalgia, unspecified site: Secondary | ICD-10-CM | POA: Diagnosis not present

## 2022-03-03 DIAGNOSIS — R2689 Other abnormalities of gait and mobility: Secondary | ICD-10-CM | POA: Diagnosis not present

## 2022-03-03 DIAGNOSIS — M810 Age-related osteoporosis without current pathological fracture: Secondary | ICD-10-CM | POA: Diagnosis not present

## 2022-03-05 DIAGNOSIS — R296 Repeated falls: Secondary | ICD-10-CM | POA: Diagnosis not present

## 2022-03-05 DIAGNOSIS — R2689 Other abnormalities of gait and mobility: Secondary | ICD-10-CM | POA: Diagnosis not present

## 2022-03-05 DIAGNOSIS — F03918 Unspecified dementia, unspecified severity, with other behavioral disturbance: Secondary | ICD-10-CM | POA: Diagnosis not present

## 2022-03-05 DIAGNOSIS — R279 Unspecified lack of coordination: Secondary | ICD-10-CM | POA: Diagnosis not present

## 2022-03-05 DIAGNOSIS — M6281 Muscle weakness (generalized): Secondary | ICD-10-CM | POA: Diagnosis not present

## 2022-03-05 DIAGNOSIS — I4811 Longstanding persistent atrial fibrillation: Secondary | ICD-10-CM | POA: Diagnosis not present

## 2022-03-10 DIAGNOSIS — M6281 Muscle weakness (generalized): Secondary | ICD-10-CM | POA: Diagnosis not present

## 2022-03-10 DIAGNOSIS — R296 Repeated falls: Secondary | ICD-10-CM | POA: Diagnosis not present

## 2022-03-10 DIAGNOSIS — F03918 Unspecified dementia, unspecified severity, with other behavioral disturbance: Secondary | ICD-10-CM | POA: Diagnosis not present

## 2022-03-10 DIAGNOSIS — R279 Unspecified lack of coordination: Secondary | ICD-10-CM | POA: Diagnosis not present

## 2022-03-10 DIAGNOSIS — R2689 Other abnormalities of gait and mobility: Secondary | ICD-10-CM | POA: Diagnosis not present

## 2022-03-10 DIAGNOSIS — I4811 Longstanding persistent atrial fibrillation: Secondary | ICD-10-CM | POA: Diagnosis not present

## 2022-03-11 ENCOUNTER — Ambulatory Visit: Payer: Medicare Other | Admitting: Podiatry

## 2022-03-12 DIAGNOSIS — I4811 Longstanding persistent atrial fibrillation: Secondary | ICD-10-CM | POA: Diagnosis not present

## 2022-03-12 DIAGNOSIS — M6281 Muscle weakness (generalized): Secondary | ICD-10-CM | POA: Diagnosis not present

## 2022-03-12 DIAGNOSIS — R279 Unspecified lack of coordination: Secondary | ICD-10-CM | POA: Diagnosis not present

## 2022-03-12 DIAGNOSIS — R2689 Other abnormalities of gait and mobility: Secondary | ICD-10-CM | POA: Diagnosis not present

## 2022-03-12 DIAGNOSIS — R296 Repeated falls: Secondary | ICD-10-CM | POA: Diagnosis not present

## 2022-03-12 DIAGNOSIS — F03918 Unspecified dementia, unspecified severity, with other behavioral disturbance: Secondary | ICD-10-CM | POA: Diagnosis not present

## 2022-03-13 DIAGNOSIS — F03918 Unspecified dementia, unspecified severity, with other behavioral disturbance: Secondary | ICD-10-CM | POA: Diagnosis not present

## 2022-03-13 DIAGNOSIS — M6281 Muscle weakness (generalized): Secondary | ICD-10-CM | POA: Diagnosis not present

## 2022-03-13 DIAGNOSIS — R2689 Other abnormalities of gait and mobility: Secondary | ICD-10-CM | POA: Diagnosis not present

## 2022-03-13 DIAGNOSIS — R279 Unspecified lack of coordination: Secondary | ICD-10-CM | POA: Diagnosis not present

## 2022-03-13 DIAGNOSIS — I4811 Longstanding persistent atrial fibrillation: Secondary | ICD-10-CM | POA: Diagnosis not present

## 2022-03-13 DIAGNOSIS — R296 Repeated falls: Secondary | ICD-10-CM | POA: Diagnosis not present

## 2022-03-16 DIAGNOSIS — F01C2 Vascular dementia, severe, with psychotic disturbance: Secondary | ICD-10-CM | POA: Diagnosis not present

## 2022-03-16 DIAGNOSIS — F419 Anxiety disorder, unspecified: Secondary | ICD-10-CM | POA: Diagnosis not present

## 2022-03-18 DIAGNOSIS — Z9181 History of falling: Secondary | ICD-10-CM | POA: Diagnosis not present

## 2022-03-18 DIAGNOSIS — N183 Chronic kidney disease, stage 3 unspecified: Secondary | ICD-10-CM | POA: Diagnosis not present

## 2022-03-18 DIAGNOSIS — M6281 Muscle weakness (generalized): Secondary | ICD-10-CM | POA: Diagnosis not present

## 2022-03-18 DIAGNOSIS — F3340 Major depressive disorder, recurrent, in remission, unspecified: Secondary | ICD-10-CM | POA: Diagnosis not present

## 2022-03-18 DIAGNOSIS — E785 Hyperlipidemia, unspecified: Secondary | ICD-10-CM | POA: Diagnosis not present

## 2022-03-18 DIAGNOSIS — M810 Age-related osteoporosis without current pathological fracture: Secondary | ICD-10-CM | POA: Diagnosis not present

## 2022-03-18 DIAGNOSIS — I25110 Atherosclerotic heart disease of native coronary artery with unstable angina pectoris: Secondary | ICD-10-CM | POA: Diagnosis not present

## 2022-03-18 DIAGNOSIS — R279 Unspecified lack of coordination: Secondary | ICD-10-CM | POA: Diagnosis not present

## 2022-03-18 DIAGNOSIS — F419 Anxiety disorder, unspecified: Secondary | ICD-10-CM | POA: Diagnosis not present

## 2022-03-18 DIAGNOSIS — I495 Sick sinus syndrome: Secondary | ICD-10-CM | POA: Diagnosis not present

## 2022-03-18 DIAGNOSIS — I4811 Longstanding persistent atrial fibrillation: Secondary | ICD-10-CM | POA: Diagnosis not present

## 2022-03-18 DIAGNOSIS — M791 Myalgia, unspecified site: Secondary | ICD-10-CM | POA: Diagnosis not present

## 2022-03-18 DIAGNOSIS — F03918 Unspecified dementia, unspecified severity, with other behavioral disturbance: Secondary | ICD-10-CM | POA: Diagnosis not present

## 2022-03-18 DIAGNOSIS — Z950 Presence of cardiac pacemaker: Secondary | ICD-10-CM | POA: Diagnosis not present

## 2022-03-18 DIAGNOSIS — R2689 Other abnormalities of gait and mobility: Secondary | ICD-10-CM | POA: Diagnosis not present

## 2022-03-18 DIAGNOSIS — R296 Repeated falls: Secondary | ICD-10-CM | POA: Diagnosis not present

## 2022-03-18 DIAGNOSIS — E1122 Type 2 diabetes mellitus with diabetic chronic kidney disease: Secondary | ICD-10-CM | POA: Diagnosis not present

## 2022-03-18 DIAGNOSIS — Z593 Problems related to living in residential institution: Secondary | ICD-10-CM | POA: Diagnosis not present

## 2022-03-18 DIAGNOSIS — Z853 Personal history of malignant neoplasm of breast: Secondary | ICD-10-CM | POA: Diagnosis not present

## 2022-03-19 DIAGNOSIS — R296 Repeated falls: Secondary | ICD-10-CM | POA: Diagnosis not present

## 2022-03-19 DIAGNOSIS — Z9181 History of falling: Secondary | ICD-10-CM | POA: Diagnosis not present

## 2022-03-19 DIAGNOSIS — R279 Unspecified lack of coordination: Secondary | ICD-10-CM | POA: Diagnosis not present

## 2022-03-19 DIAGNOSIS — F03918 Unspecified dementia, unspecified severity, with other behavioral disturbance: Secondary | ICD-10-CM | POA: Diagnosis not present

## 2022-03-19 DIAGNOSIS — M6281 Muscle weakness (generalized): Secondary | ICD-10-CM | POA: Diagnosis not present

## 2022-03-19 DIAGNOSIS — R2689 Other abnormalities of gait and mobility: Secondary | ICD-10-CM | POA: Diagnosis not present

## 2022-03-20 DIAGNOSIS — R279 Unspecified lack of coordination: Secondary | ICD-10-CM | POA: Diagnosis not present

## 2022-03-20 DIAGNOSIS — M6281 Muscle weakness (generalized): Secondary | ICD-10-CM | POA: Diagnosis not present

## 2022-03-20 DIAGNOSIS — R2689 Other abnormalities of gait and mobility: Secondary | ICD-10-CM | POA: Diagnosis not present

## 2022-03-20 DIAGNOSIS — R296 Repeated falls: Secondary | ICD-10-CM | POA: Diagnosis not present

## 2022-03-20 DIAGNOSIS — Z9181 History of falling: Secondary | ICD-10-CM | POA: Diagnosis not present

## 2022-03-20 DIAGNOSIS — F03918 Unspecified dementia, unspecified severity, with other behavioral disturbance: Secondary | ICD-10-CM | POA: Diagnosis not present

## 2022-03-23 DIAGNOSIS — Z9181 History of falling: Secondary | ICD-10-CM | POA: Diagnosis not present

## 2022-03-23 DIAGNOSIS — R296 Repeated falls: Secondary | ICD-10-CM | POA: Diagnosis not present

## 2022-03-23 DIAGNOSIS — F03918 Unspecified dementia, unspecified severity, with other behavioral disturbance: Secondary | ICD-10-CM | POA: Diagnosis not present

## 2022-03-23 DIAGNOSIS — R2689 Other abnormalities of gait and mobility: Secondary | ICD-10-CM | POA: Diagnosis not present

## 2022-03-23 DIAGNOSIS — R279 Unspecified lack of coordination: Secondary | ICD-10-CM | POA: Diagnosis not present

## 2022-03-23 DIAGNOSIS — M6281 Muscle weakness (generalized): Secondary | ICD-10-CM | POA: Diagnosis not present

## 2022-03-25 DIAGNOSIS — M6281 Muscle weakness (generalized): Secondary | ICD-10-CM | POA: Diagnosis not present

## 2022-03-25 DIAGNOSIS — R279 Unspecified lack of coordination: Secondary | ICD-10-CM | POA: Diagnosis not present

## 2022-03-25 DIAGNOSIS — R2689 Other abnormalities of gait and mobility: Secondary | ICD-10-CM | POA: Diagnosis not present

## 2022-03-25 DIAGNOSIS — F03918 Unspecified dementia, unspecified severity, with other behavioral disturbance: Secondary | ICD-10-CM | POA: Diagnosis not present

## 2022-03-25 DIAGNOSIS — Z9181 History of falling: Secondary | ICD-10-CM | POA: Diagnosis not present

## 2022-03-25 DIAGNOSIS — R296 Repeated falls: Secondary | ICD-10-CM | POA: Diagnosis not present

## 2022-03-27 ENCOUNTER — Non-Acute Institutional Stay: Payer: Medicare Other | Admitting: Student

## 2022-03-27 DIAGNOSIS — R296 Repeated falls: Secondary | ICD-10-CM | POA: Diagnosis not present

## 2022-03-27 DIAGNOSIS — Z515 Encounter for palliative care: Secondary | ICD-10-CM

## 2022-03-27 DIAGNOSIS — F03918 Unspecified dementia, unspecified severity, with other behavioral disturbance: Secondary | ICD-10-CM | POA: Diagnosis not present

## 2022-03-27 DIAGNOSIS — M6281 Muscle weakness (generalized): Secondary | ICD-10-CM | POA: Diagnosis not present

## 2022-03-27 DIAGNOSIS — R279 Unspecified lack of coordination: Secondary | ICD-10-CM | POA: Diagnosis not present

## 2022-03-27 DIAGNOSIS — Z9181 History of falling: Secondary | ICD-10-CM | POA: Diagnosis not present

## 2022-03-27 DIAGNOSIS — R2689 Other abnormalities of gait and mobility: Secondary | ICD-10-CM | POA: Diagnosis not present

## 2022-03-30 NOTE — Progress Notes (Signed)
[facility] Consult Note Telephone: [phone]  Fax: [phone]    Date of encounter: 03/27/2022  PATIENT NAME: Holly Rose   [phone] (home)  DOB: Jul 05, 1930 MRN: [id number] PRIMARY CARE PROVIDER:    Kirk Ruths, MD,  [place] [address] [phone]  REFERRING PROVIDER:   Kirk Ruths, MD [facility] [place],  [place] [address] [phone]  RESPONSIBLE PARTY:    Contact Information     Name Relation Home Work [place] Relative   [phone]   [name], [name] [phone]  [phone]   [facility] Daughter   [phone]        I met face to face with patient in the facility. Palliative Care was asked to follow this patient by consultation request of  Kirk Ruths, MD to address advance care planning and complex medical decision making. This is a follow up visit.                                   ASSESSMENT AND PLAN / RECOMMENDATIONS:   Advance Care Planning/Goals of Care: Goals include to maximize quality of life and symptom management. Patient/health care surrogate gave his/her permission to discuss.  CODE STATUS: DNR  Symptom Management/Plan:  Dementia- patient with agitation. She is adjusting to AL. She does still become agitated when husband visits and leaves, but is usually able to be redirected. Patient with recent medication adjustments per psychiatry. Continue haldol 26m BID, olanzapine 2.5 mg BID and 5 mg QHS. Staff to reorient and redirect as needed, monitor for falls/safety. Monitor for cognitive and functional declines. Continue daily caregivers per family.   Follow up Palliative Care Visit: Palliative care will continue to follow for complex medical decision making, advance care planning, and clarification of goals. Return in 8  weeks or prn.   This visit was coded based on medical decision making (MDM).  PPS: 50%  HOSPICE ELIGIBILITY/DIAGNOSIS: TBD  Chief Complaint: Palliative Medicine follow up visit.   HISTORY OF PRESENT ILLNESS:  Ether A PDanielsonis a 88y.o. year old female  with  dementia, T2DM, depression, stroke, TIA, hallucinations, anemia, osteomyelitis of foot, breast CA-on tamoxifen, hx of NSTEMI.       Patient resides at [facility]. Staff report patient adjusting to move. She has daily caregivers; husband also visits daily. Patient with agitation when husband leaves, but is usually able to be redirected. She is followed by psychiatry; recent medication adjustments. She does ambulate ad lib about unit. She is able to answer direct questions. She denies pain, shortness of breath, nausea, constipation. No worsening hallucinations reported. No recent falls/injury or UTI's. Staff contributes to HPI due to patient's dementia.   History obtained from review of EMR, discussion with primary team, and interview with family, facility staff/caregiver and/or Ms. PRonne Binning  I reviewed available labs, medications, imaging, studies and related documents from the EMR.  Records reviewed and summarized above.   Physical Exam: Weight: 120 pounds Pulse 78, resp 16, sats 98% on room air Constitutional: NAD General: frail appearing, thin EYES: anicteric sclera, lids intact, no discharge  ENMT: intact hearing, oral mucous membranes moist, dentition intact CV: S1S2, RRR, no LE edema Pulmonary: LCTA, no increased work of breathing, no cough, room air Abdomen: intake 100%, normo-active BS +  4 quadrants, soft and non tender, no ascites GU: deferred MSK: no sarcopenia, moves all extremities, ambulatory Skin: warm and dry, no rashes or wounds on visible skin Neuro:  no generalized weakness,  no cognitive impairment Psych: non-anxious affect, A and O x 3 Hem/lymph/immuno: no widespread bruising   Thank you for the  opportunity to participate in the care of [name].  The palliative care team will continue to follow. Please call our office at [phone] if we can be of additional assistance.   Ezekiel Slocumb, NP   COVID-19 PATIENT SCREENING TOOL Asked and negative response unless otherwise noted:   Have you had symptoms of covid, tested positive or been in contact with someone with symptoms/positive test in the past 5-10 days? No

## 2022-04-01 DIAGNOSIS — R296 Repeated falls: Secondary | ICD-10-CM | POA: Diagnosis not present

## 2022-04-01 DIAGNOSIS — R2689 Other abnormalities of gait and mobility: Secondary | ICD-10-CM | POA: Diagnosis not present

## 2022-04-01 DIAGNOSIS — R279 Unspecified lack of coordination: Secondary | ICD-10-CM | POA: Diagnosis not present

## 2022-04-01 DIAGNOSIS — F03918 Unspecified dementia, unspecified severity, with other behavioral disturbance: Secondary | ICD-10-CM | POA: Diagnosis not present

## 2022-04-01 DIAGNOSIS — M6281 Muscle weakness (generalized): Secondary | ICD-10-CM | POA: Diagnosis not present

## 2022-04-01 DIAGNOSIS — Z9181 History of falling: Secondary | ICD-10-CM | POA: Diagnosis not present

## 2022-04-02 ENCOUNTER — Ambulatory Visit: Payer: Medicare Other | Admitting: Internal Medicine

## 2022-04-02 DIAGNOSIS — R296 Repeated falls: Secondary | ICD-10-CM | POA: Diagnosis not present

## 2022-04-02 DIAGNOSIS — F03918 Unspecified dementia, unspecified severity, with other behavioral disturbance: Secondary | ICD-10-CM | POA: Diagnosis not present

## 2022-04-02 DIAGNOSIS — M6281 Muscle weakness (generalized): Secondary | ICD-10-CM | POA: Diagnosis not present

## 2022-04-02 DIAGNOSIS — Z9181 History of falling: Secondary | ICD-10-CM | POA: Diagnosis not present

## 2022-04-02 DIAGNOSIS — R279 Unspecified lack of coordination: Secondary | ICD-10-CM | POA: Diagnosis not present

## 2022-04-02 DIAGNOSIS — R2689 Other abnormalities of gait and mobility: Secondary | ICD-10-CM | POA: Diagnosis not present

## 2022-04-03 DIAGNOSIS — R296 Repeated falls: Secondary | ICD-10-CM | POA: Diagnosis not present

## 2022-04-03 DIAGNOSIS — F03918 Unspecified dementia, unspecified severity, with other behavioral disturbance: Secondary | ICD-10-CM | POA: Diagnosis not present

## 2022-04-03 DIAGNOSIS — R279 Unspecified lack of coordination: Secondary | ICD-10-CM | POA: Diagnosis not present

## 2022-04-03 DIAGNOSIS — R2689 Other abnormalities of gait and mobility: Secondary | ICD-10-CM | POA: Diagnosis not present

## 2022-04-03 DIAGNOSIS — Z9181 History of falling: Secondary | ICD-10-CM | POA: Diagnosis not present

## 2022-04-03 DIAGNOSIS — M6281 Muscle weakness (generalized): Secondary | ICD-10-CM | POA: Diagnosis not present

## 2022-04-06 DIAGNOSIS — R296 Repeated falls: Secondary | ICD-10-CM | POA: Diagnosis not present

## 2022-04-06 DIAGNOSIS — F03918 Unspecified dementia, unspecified severity, with other behavioral disturbance: Secondary | ICD-10-CM | POA: Diagnosis not present

## 2022-04-06 DIAGNOSIS — M6281 Muscle weakness (generalized): Secondary | ICD-10-CM | POA: Diagnosis not present

## 2022-04-06 DIAGNOSIS — R279 Unspecified lack of coordination: Secondary | ICD-10-CM | POA: Diagnosis not present

## 2022-04-06 DIAGNOSIS — R2689 Other abnormalities of gait and mobility: Secondary | ICD-10-CM | POA: Diagnosis not present

## 2022-04-06 DIAGNOSIS — Z9181 History of falling: Secondary | ICD-10-CM | POA: Diagnosis not present

## 2022-04-08 DIAGNOSIS — R2689 Other abnormalities of gait and mobility: Secondary | ICD-10-CM | POA: Diagnosis not present

## 2022-04-08 DIAGNOSIS — R279 Unspecified lack of coordination: Secondary | ICD-10-CM | POA: Diagnosis not present

## 2022-04-08 DIAGNOSIS — F03918 Unspecified dementia, unspecified severity, with other behavioral disturbance: Secondary | ICD-10-CM | POA: Diagnosis not present

## 2022-04-08 DIAGNOSIS — R296 Repeated falls: Secondary | ICD-10-CM | POA: Diagnosis not present

## 2022-04-08 DIAGNOSIS — Z9181 History of falling: Secondary | ICD-10-CM | POA: Diagnosis not present

## 2022-04-08 DIAGNOSIS — M6281 Muscle weakness (generalized): Secondary | ICD-10-CM | POA: Diagnosis not present

## 2022-04-09 DIAGNOSIS — Z9181 History of falling: Secondary | ICD-10-CM | POA: Diagnosis not present

## 2022-04-09 DIAGNOSIS — R279 Unspecified lack of coordination: Secondary | ICD-10-CM | POA: Diagnosis not present

## 2022-04-09 DIAGNOSIS — R2689 Other abnormalities of gait and mobility: Secondary | ICD-10-CM | POA: Diagnosis not present

## 2022-04-09 DIAGNOSIS — M6281 Muscle weakness (generalized): Secondary | ICD-10-CM | POA: Diagnosis not present

## 2022-04-09 DIAGNOSIS — F03918 Unspecified dementia, unspecified severity, with other behavioral disturbance: Secondary | ICD-10-CM | POA: Diagnosis not present

## 2022-04-09 DIAGNOSIS — R296 Repeated falls: Secondary | ICD-10-CM | POA: Diagnosis not present

## 2022-04-14 DIAGNOSIS — R410 Disorientation, unspecified: Secondary | ICD-10-CM | POA: Diagnosis not present

## 2022-04-14 DIAGNOSIS — N390 Urinary tract infection, site not specified: Secondary | ICD-10-CM | POA: Diagnosis not present

## 2022-04-15 DIAGNOSIS — I4811 Longstanding persistent atrial fibrillation: Secondary | ICD-10-CM | POA: Diagnosis not present

## 2022-04-15 DIAGNOSIS — Z593 Problems related to living in residential institution: Secondary | ICD-10-CM | POA: Diagnosis not present

## 2022-04-15 DIAGNOSIS — E1122 Type 2 diabetes mellitus with diabetic chronic kidney disease: Secondary | ICD-10-CM | POA: Diagnosis not present

## 2022-04-15 DIAGNOSIS — N183 Chronic kidney disease, stage 3 unspecified: Secondary | ICD-10-CM | POA: Diagnosis not present

## 2022-04-15 DIAGNOSIS — Z9181 History of falling: Secondary | ICD-10-CM | POA: Diagnosis not present

## 2022-04-15 DIAGNOSIS — M6281 Muscle weakness (generalized): Secondary | ICD-10-CM | POA: Diagnosis not present

## 2022-04-15 DIAGNOSIS — Z950 Presence of cardiac pacemaker: Secondary | ICD-10-CM | POA: Diagnosis not present

## 2022-04-15 DIAGNOSIS — F419 Anxiety disorder, unspecified: Secondary | ICD-10-CM | POA: Diagnosis not present

## 2022-04-15 DIAGNOSIS — F3340 Major depressive disorder, recurrent, in remission, unspecified: Secondary | ICD-10-CM | POA: Diagnosis not present

## 2022-04-15 DIAGNOSIS — E785 Hyperlipidemia, unspecified: Secondary | ICD-10-CM | POA: Diagnosis not present

## 2022-04-15 DIAGNOSIS — M791 Myalgia, unspecified site: Secondary | ICD-10-CM | POA: Diagnosis not present

## 2022-04-15 DIAGNOSIS — R296 Repeated falls: Secondary | ICD-10-CM | POA: Diagnosis not present

## 2022-04-15 DIAGNOSIS — Z853 Personal history of malignant neoplasm of breast: Secondary | ICD-10-CM | POA: Diagnosis not present

## 2022-04-15 DIAGNOSIS — R279 Unspecified lack of coordination: Secondary | ICD-10-CM | POA: Diagnosis not present

## 2022-04-15 DIAGNOSIS — F03918 Unspecified dementia, unspecified severity, with other behavioral disturbance: Secondary | ICD-10-CM | POA: Diagnosis not present

## 2022-04-15 DIAGNOSIS — M810 Age-related osteoporosis without current pathological fracture: Secondary | ICD-10-CM | POA: Diagnosis not present

## 2022-04-15 DIAGNOSIS — F01C2 Vascular dementia, severe, with psychotic disturbance: Secondary | ICD-10-CM | POA: Diagnosis not present

## 2022-04-15 DIAGNOSIS — I25110 Atherosclerotic heart disease of native coronary artery with unstable angina pectoris: Secondary | ICD-10-CM | POA: Diagnosis not present

## 2022-04-15 DIAGNOSIS — R2689 Other abnormalities of gait and mobility: Secondary | ICD-10-CM | POA: Diagnosis not present

## 2022-04-17 DIAGNOSIS — Z9181 History of falling: Secondary | ICD-10-CM | POA: Diagnosis not present

## 2022-04-17 DIAGNOSIS — R296 Repeated falls: Secondary | ICD-10-CM | POA: Diagnosis not present

## 2022-04-17 DIAGNOSIS — R2689 Other abnormalities of gait and mobility: Secondary | ICD-10-CM | POA: Diagnosis not present

## 2022-04-17 DIAGNOSIS — F03918 Unspecified dementia, unspecified severity, with other behavioral disturbance: Secondary | ICD-10-CM | POA: Diagnosis not present

## 2022-04-17 DIAGNOSIS — M6281 Muscle weakness (generalized): Secondary | ICD-10-CM | POA: Diagnosis not present

## 2022-04-17 DIAGNOSIS — R279 Unspecified lack of coordination: Secondary | ICD-10-CM | POA: Diagnosis not present

## 2022-04-20 DIAGNOSIS — Z9181 History of falling: Secondary | ICD-10-CM | POA: Diagnosis not present

## 2022-04-20 DIAGNOSIS — F01C2 Vascular dementia, severe, with psychotic disturbance: Secondary | ICD-10-CM | POA: Diagnosis not present

## 2022-04-20 DIAGNOSIS — F03918 Unspecified dementia, unspecified severity, with other behavioral disturbance: Secondary | ICD-10-CM | POA: Diagnosis not present

## 2022-04-20 DIAGNOSIS — R279 Unspecified lack of coordination: Secondary | ICD-10-CM | POA: Diagnosis not present

## 2022-04-20 DIAGNOSIS — R2689 Other abnormalities of gait and mobility: Secondary | ICD-10-CM | POA: Diagnosis not present

## 2022-04-20 DIAGNOSIS — M6281 Muscle weakness (generalized): Secondary | ICD-10-CM | POA: Diagnosis not present

## 2022-04-20 DIAGNOSIS — R296 Repeated falls: Secondary | ICD-10-CM | POA: Diagnosis not present

## 2022-04-20 DIAGNOSIS — F419 Anxiety disorder, unspecified: Secondary | ICD-10-CM | POA: Diagnosis not present

## 2022-04-22 DIAGNOSIS — Z9181 History of falling: Secondary | ICD-10-CM | POA: Diagnosis not present

## 2022-04-22 DIAGNOSIS — R279 Unspecified lack of coordination: Secondary | ICD-10-CM | POA: Diagnosis not present

## 2022-04-22 DIAGNOSIS — F03918 Unspecified dementia, unspecified severity, with other behavioral disturbance: Secondary | ICD-10-CM | POA: Diagnosis not present

## 2022-04-22 DIAGNOSIS — M6281 Muscle weakness (generalized): Secondary | ICD-10-CM | POA: Diagnosis not present

## 2022-04-22 DIAGNOSIS — R296 Repeated falls: Secondary | ICD-10-CM | POA: Diagnosis not present

## 2022-04-22 DIAGNOSIS — R2689 Other abnormalities of gait and mobility: Secondary | ICD-10-CM | POA: Diagnosis not present

## 2022-04-24 DIAGNOSIS — R2689 Other abnormalities of gait and mobility: Secondary | ICD-10-CM | POA: Diagnosis not present

## 2022-04-24 DIAGNOSIS — R279 Unspecified lack of coordination: Secondary | ICD-10-CM | POA: Diagnosis not present

## 2022-04-24 DIAGNOSIS — F03918 Unspecified dementia, unspecified severity, with other behavioral disturbance: Secondary | ICD-10-CM | POA: Diagnosis not present

## 2022-04-24 DIAGNOSIS — M6281 Muscle weakness (generalized): Secondary | ICD-10-CM | POA: Diagnosis not present

## 2022-04-24 DIAGNOSIS — R296 Repeated falls: Secondary | ICD-10-CM | POA: Diagnosis not present

## 2022-04-24 DIAGNOSIS — Z9181 History of falling: Secondary | ICD-10-CM | POA: Diagnosis not present

## 2022-04-28 DIAGNOSIS — F03918 Unspecified dementia, unspecified severity, with other behavioral disturbance: Secondary | ICD-10-CM | POA: Diagnosis not present

## 2022-04-28 DIAGNOSIS — Z9181 History of falling: Secondary | ICD-10-CM | POA: Diagnosis not present

## 2022-04-28 DIAGNOSIS — R296 Repeated falls: Secondary | ICD-10-CM | POA: Diagnosis not present

## 2022-04-28 DIAGNOSIS — M6281 Muscle weakness (generalized): Secondary | ICD-10-CM | POA: Diagnosis not present

## 2022-04-28 DIAGNOSIS — R279 Unspecified lack of coordination: Secondary | ICD-10-CM | POA: Diagnosis not present

## 2022-04-28 DIAGNOSIS — R2689 Other abnormalities of gait and mobility: Secondary | ICD-10-CM | POA: Diagnosis not present

## 2022-04-30 DIAGNOSIS — R2689 Other abnormalities of gait and mobility: Secondary | ICD-10-CM | POA: Diagnosis not present

## 2022-04-30 DIAGNOSIS — R279 Unspecified lack of coordination: Secondary | ICD-10-CM | POA: Diagnosis not present

## 2022-04-30 DIAGNOSIS — R296 Repeated falls: Secondary | ICD-10-CM | POA: Diagnosis not present

## 2022-04-30 DIAGNOSIS — F03918 Unspecified dementia, unspecified severity, with other behavioral disturbance: Secondary | ICD-10-CM | POA: Diagnosis not present

## 2022-04-30 DIAGNOSIS — M6281 Muscle weakness (generalized): Secondary | ICD-10-CM | POA: Diagnosis not present

## 2022-04-30 DIAGNOSIS — Z9181 History of falling: Secondary | ICD-10-CM | POA: Diagnosis not present

## 2022-05-04 DIAGNOSIS — M6281 Muscle weakness (generalized): Secondary | ICD-10-CM | POA: Diagnosis not present

## 2022-05-04 DIAGNOSIS — R296 Repeated falls: Secondary | ICD-10-CM | POA: Diagnosis not present

## 2022-05-04 DIAGNOSIS — F03918 Unspecified dementia, unspecified severity, with other behavioral disturbance: Secondary | ICD-10-CM | POA: Diagnosis not present

## 2022-05-04 DIAGNOSIS — R2689 Other abnormalities of gait and mobility: Secondary | ICD-10-CM | POA: Diagnosis not present

## 2022-05-04 DIAGNOSIS — R279 Unspecified lack of coordination: Secondary | ICD-10-CM | POA: Diagnosis not present

## 2022-05-04 DIAGNOSIS — Z9181 History of falling: Secondary | ICD-10-CM | POA: Diagnosis not present

## 2022-05-06 DIAGNOSIS — Z170 Estrogen receptor positive status [ER+]: Secondary | ICD-10-CM | POA: Diagnosis not present

## 2022-05-06 DIAGNOSIS — E785 Hyperlipidemia, unspecified: Secondary | ICD-10-CM | POA: Diagnosis not present

## 2022-05-06 DIAGNOSIS — C50412 Malignant neoplasm of upper-outer quadrant of left female breast: Secondary | ICD-10-CM | POA: Diagnosis not present

## 2022-05-06 DIAGNOSIS — R296 Repeated falls: Secondary | ICD-10-CM | POA: Diagnosis not present

## 2022-05-06 DIAGNOSIS — R279 Unspecified lack of coordination: Secondary | ICD-10-CM | POA: Diagnosis not present

## 2022-05-06 DIAGNOSIS — I495 Sick sinus syndrome: Secondary | ICD-10-CM | POA: Diagnosis not present

## 2022-05-06 DIAGNOSIS — E1122 Type 2 diabetes mellitus with diabetic chronic kidney disease: Secondary | ICD-10-CM | POA: Diagnosis not present

## 2022-05-06 DIAGNOSIS — R2689 Other abnormalities of gait and mobility: Secondary | ICD-10-CM | POA: Diagnosis not present

## 2022-05-06 DIAGNOSIS — Z9181 History of falling: Secondary | ICD-10-CM | POA: Diagnosis not present

## 2022-05-06 DIAGNOSIS — Z593 Problems related to living in residential institution: Secondary | ICD-10-CM | POA: Diagnosis not present

## 2022-05-06 DIAGNOSIS — I4811 Longstanding persistent atrial fibrillation: Secondary | ICD-10-CM | POA: Diagnosis not present

## 2022-05-06 DIAGNOSIS — M6281 Muscle weakness (generalized): Secondary | ICD-10-CM | POA: Diagnosis not present

## 2022-05-06 DIAGNOSIS — F03918 Unspecified dementia, unspecified severity, with other behavioral disturbance: Secondary | ICD-10-CM | POA: Diagnosis not present

## 2022-05-08 DIAGNOSIS — R296 Repeated falls: Secondary | ICD-10-CM | POA: Diagnosis not present

## 2022-05-08 DIAGNOSIS — Z9181 History of falling: Secondary | ICD-10-CM | POA: Diagnosis not present

## 2022-05-08 DIAGNOSIS — M6281 Muscle weakness (generalized): Secondary | ICD-10-CM | POA: Diagnosis not present

## 2022-05-08 DIAGNOSIS — F03918 Unspecified dementia, unspecified severity, with other behavioral disturbance: Secondary | ICD-10-CM | POA: Diagnosis not present

## 2022-05-08 DIAGNOSIS — R2689 Other abnormalities of gait and mobility: Secondary | ICD-10-CM | POA: Diagnosis not present

## 2022-05-08 DIAGNOSIS — R279 Unspecified lack of coordination: Secondary | ICD-10-CM | POA: Diagnosis not present

## 2022-05-11 DIAGNOSIS — R279 Unspecified lack of coordination: Secondary | ICD-10-CM | POA: Diagnosis not present

## 2022-05-11 DIAGNOSIS — R296 Repeated falls: Secondary | ICD-10-CM | POA: Diagnosis not present

## 2022-05-11 DIAGNOSIS — R2689 Other abnormalities of gait and mobility: Secondary | ICD-10-CM | POA: Diagnosis not present

## 2022-05-11 DIAGNOSIS — F03918 Unspecified dementia, unspecified severity, with other behavioral disturbance: Secondary | ICD-10-CM | POA: Diagnosis not present

## 2022-05-11 DIAGNOSIS — M6281 Muscle weakness (generalized): Secondary | ICD-10-CM | POA: Diagnosis not present

## 2022-05-11 DIAGNOSIS — Z9181 History of falling: Secondary | ICD-10-CM | POA: Diagnosis not present

## 2022-05-14 DIAGNOSIS — R296 Repeated falls: Secondary | ICD-10-CM | POA: Diagnosis not present

## 2022-05-14 DIAGNOSIS — Z9181 History of falling: Secondary | ICD-10-CM | POA: Diagnosis not present

## 2022-05-14 DIAGNOSIS — F03918 Unspecified dementia, unspecified severity, with other behavioral disturbance: Secondary | ICD-10-CM | POA: Diagnosis not present

## 2022-05-14 DIAGNOSIS — R279 Unspecified lack of coordination: Secondary | ICD-10-CM | POA: Diagnosis not present

## 2022-05-14 DIAGNOSIS — M6281 Muscle weakness (generalized): Secondary | ICD-10-CM | POA: Diagnosis not present

## 2022-05-14 DIAGNOSIS — R2689 Other abnormalities of gait and mobility: Secondary | ICD-10-CM | POA: Diagnosis not present

## 2022-05-15 DIAGNOSIS — I495 Sick sinus syndrome: Secondary | ICD-10-CM | POA: Diagnosis not present

## 2022-05-15 DIAGNOSIS — Z593 Problems related to living in residential institution: Secondary | ICD-10-CM | POA: Diagnosis not present

## 2022-05-15 DIAGNOSIS — R296 Repeated falls: Secondary | ICD-10-CM | POA: Diagnosis not present

## 2022-05-15 DIAGNOSIS — M791 Myalgia, unspecified site: Secondary | ICD-10-CM | POA: Diagnosis not present

## 2022-05-15 DIAGNOSIS — F3340 Major depressive disorder, recurrent, in remission, unspecified: Secondary | ICD-10-CM | POA: Diagnosis not present

## 2022-05-15 DIAGNOSIS — Z950 Presence of cardiac pacemaker: Secondary | ICD-10-CM | POA: Diagnosis not present

## 2022-05-15 DIAGNOSIS — E1122 Type 2 diabetes mellitus with diabetic chronic kidney disease: Secondary | ICD-10-CM | POA: Diagnosis not present

## 2022-05-15 DIAGNOSIS — M810 Age-related osteoporosis without current pathological fracture: Secondary | ICD-10-CM | POA: Diagnosis not present

## 2022-05-15 DIAGNOSIS — I25110 Atherosclerotic heart disease of native coronary artery with unstable angina pectoris: Secondary | ICD-10-CM | POA: Diagnosis not present

## 2022-05-15 DIAGNOSIS — Z9181 History of falling: Secondary | ICD-10-CM | POA: Diagnosis not present

## 2022-05-15 DIAGNOSIS — N183 Chronic kidney disease, stage 3 unspecified: Secondary | ICD-10-CM | POA: Diagnosis not present

## 2022-05-15 DIAGNOSIS — Z853 Personal history of malignant neoplasm of breast: Secondary | ICD-10-CM | POA: Diagnosis not present

## 2022-05-15 DIAGNOSIS — E785 Hyperlipidemia, unspecified: Secondary | ICD-10-CM | POA: Diagnosis not present

## 2022-05-15 DIAGNOSIS — F03918 Unspecified dementia, unspecified severity, with other behavioral disturbance: Secondary | ICD-10-CM | POA: Diagnosis not present

## 2022-05-15 DIAGNOSIS — R279 Unspecified lack of coordination: Secondary | ICD-10-CM | POA: Diagnosis not present

## 2022-05-15 DIAGNOSIS — F419 Anxiety disorder, unspecified: Secondary | ICD-10-CM | POA: Diagnosis not present

## 2022-05-15 DIAGNOSIS — R2689 Other abnormalities of gait and mobility: Secondary | ICD-10-CM | POA: Diagnosis not present

## 2022-05-15 DIAGNOSIS — M6281 Muscle weakness (generalized): Secondary | ICD-10-CM | POA: Diagnosis not present

## 2022-05-20 DIAGNOSIS — Z9181 History of falling: Secondary | ICD-10-CM | POA: Diagnosis not present

## 2022-05-20 DIAGNOSIS — M6281 Muscle weakness (generalized): Secondary | ICD-10-CM | POA: Diagnosis not present

## 2022-05-20 DIAGNOSIS — F03918 Unspecified dementia, unspecified severity, with other behavioral disturbance: Secondary | ICD-10-CM | POA: Diagnosis not present

## 2022-05-20 DIAGNOSIS — R2689 Other abnormalities of gait and mobility: Secondary | ICD-10-CM | POA: Diagnosis not present

## 2022-05-20 DIAGNOSIS — R279 Unspecified lack of coordination: Secondary | ICD-10-CM | POA: Diagnosis not present

## 2022-05-20 DIAGNOSIS — R296 Repeated falls: Secondary | ICD-10-CM | POA: Diagnosis not present

## 2022-05-21 DIAGNOSIS — Z9181 History of falling: Secondary | ICD-10-CM | POA: Diagnosis not present

## 2022-05-21 DIAGNOSIS — R2689 Other abnormalities of gait and mobility: Secondary | ICD-10-CM | POA: Diagnosis not present

## 2022-05-21 DIAGNOSIS — M6281 Muscle weakness (generalized): Secondary | ICD-10-CM | POA: Diagnosis not present

## 2022-05-21 DIAGNOSIS — F03918 Unspecified dementia, unspecified severity, with other behavioral disturbance: Secondary | ICD-10-CM | POA: Diagnosis not present

## 2022-05-21 DIAGNOSIS — R279 Unspecified lack of coordination: Secondary | ICD-10-CM | POA: Diagnosis not present

## 2022-05-21 DIAGNOSIS — R296 Repeated falls: Secondary | ICD-10-CM | POA: Diagnosis not present

## 2022-05-22 DIAGNOSIS — R279 Unspecified lack of coordination: Secondary | ICD-10-CM | POA: Diagnosis not present

## 2022-05-22 DIAGNOSIS — Z9181 History of falling: Secondary | ICD-10-CM | POA: Diagnosis not present

## 2022-05-22 DIAGNOSIS — R2689 Other abnormalities of gait and mobility: Secondary | ICD-10-CM | POA: Diagnosis not present

## 2022-05-22 DIAGNOSIS — F03918 Unspecified dementia, unspecified severity, with other behavioral disturbance: Secondary | ICD-10-CM | POA: Diagnosis not present

## 2022-05-22 DIAGNOSIS — M6281 Muscle weakness (generalized): Secondary | ICD-10-CM | POA: Diagnosis not present

## 2022-05-22 DIAGNOSIS — R296 Repeated falls: Secondary | ICD-10-CM | POA: Diagnosis not present

## 2022-05-25 DIAGNOSIS — F419 Anxiety disorder, unspecified: Secondary | ICD-10-CM | POA: Diagnosis not present

## 2022-05-25 DIAGNOSIS — F01C2 Vascular dementia, severe, with psychotic disturbance: Secondary | ICD-10-CM | POA: Diagnosis not present

## 2022-05-26 DIAGNOSIS — R296 Repeated falls: Secondary | ICD-10-CM | POA: Diagnosis not present

## 2022-05-26 DIAGNOSIS — Z9181 History of falling: Secondary | ICD-10-CM | POA: Diagnosis not present

## 2022-05-26 DIAGNOSIS — R279 Unspecified lack of coordination: Secondary | ICD-10-CM | POA: Diagnosis not present

## 2022-05-26 DIAGNOSIS — M6281 Muscle weakness (generalized): Secondary | ICD-10-CM | POA: Diagnosis not present

## 2022-05-26 DIAGNOSIS — R2689 Other abnormalities of gait and mobility: Secondary | ICD-10-CM | POA: Diagnosis not present

## 2022-05-26 DIAGNOSIS — F03918 Unspecified dementia, unspecified severity, with other behavioral disturbance: Secondary | ICD-10-CM | POA: Diagnosis not present

## 2022-05-28 DIAGNOSIS — M6281 Muscle weakness (generalized): Secondary | ICD-10-CM | POA: Diagnosis not present

## 2022-05-28 DIAGNOSIS — F03918 Unspecified dementia, unspecified severity, with other behavioral disturbance: Secondary | ICD-10-CM | POA: Diagnosis not present

## 2022-05-28 DIAGNOSIS — R296 Repeated falls: Secondary | ICD-10-CM | POA: Diagnosis not present

## 2022-05-28 DIAGNOSIS — Z9181 History of falling: Secondary | ICD-10-CM | POA: Diagnosis not present

## 2022-05-28 DIAGNOSIS — R279 Unspecified lack of coordination: Secondary | ICD-10-CM | POA: Diagnosis not present

## 2022-05-28 DIAGNOSIS — R2689 Other abnormalities of gait and mobility: Secondary | ICD-10-CM | POA: Diagnosis not present

## 2022-05-29 ENCOUNTER — Non-Acute Institutional Stay: Payer: Medicare Other | Admitting: Student

## 2022-05-29 DIAGNOSIS — Z515 Encounter for palliative care: Secondary | ICD-10-CM | POA: Diagnosis not present

## 2022-05-29 DIAGNOSIS — R2689 Other abnormalities of gait and mobility: Secondary | ICD-10-CM | POA: Diagnosis not present

## 2022-05-29 DIAGNOSIS — R296 Repeated falls: Secondary | ICD-10-CM | POA: Diagnosis not present

## 2022-05-29 DIAGNOSIS — M6281 Muscle weakness (generalized): Secondary | ICD-10-CM | POA: Diagnosis not present

## 2022-05-29 DIAGNOSIS — F03918 Unspecified dementia, unspecified severity, with other behavioral disturbance: Secondary | ICD-10-CM

## 2022-05-29 DIAGNOSIS — Z9181 History of falling: Secondary | ICD-10-CM | POA: Diagnosis not present

## 2022-05-29 DIAGNOSIS — R279 Unspecified lack of coordination: Secondary | ICD-10-CM | POA: Diagnosis not present

## 2022-05-30 NOTE — Progress Notes (Signed)
[facility] Consult Note Telephone: [phone]  Fax: [phone]    Date of encounter: 05/29/2022  PATIENT NAME: Holly Rose [place] [address]   [phone] (home)  DOB: [birth year] MRN: [id number] PRIMARY CARE PROVIDER:    Kirk Ruths, MD,  [place] [address] [phone]  REFERRING PROVIDER:   Kirk Ruths, MD [place] [facility] [place],  [place] [address] [phone]  RESPONSIBLE PARTY:    Contact Information     Name Relation Home Work [place] Relative   [phone]   Vollie, Aaron [phone]  [phone]   [facility] Daughter   [phone]        I met face to face with patient in the facility. Palliative Care was asked to follow this patient by consultation request of  Holly Ruths, MD to address advance care planning and complex medical decision making. This is a follow up visit.                                   ASSESSMENT AND PLAN / RECOMMENDATIONS:   Advance Care Planning/Goals of Care: Goals include to maximize quality of life and symptom management. Patient/health care surrogate gave his/her permission to discuss.  CODE STATUS: DNR  Palliative Medicine will continue to provide supportive care, symptom management as needed.   Symptom Management/Plan:  Dementia with behavioral disturbance-patient resides on AL unit. She requires assistance with all adl's. Her mood is labile. Agitation/anxiety noted. Continue haldol 1 mg BID, olanzapine BID, lorazepam 0.5m TID and PRN recently started. Staff report medication being effective so far. staff to reorient and redirect as needed. Monitor for falls/safety. Continue daily caregivers. [facility]   Follow up Palliative Care Visit: Palliative care will continue to follow for complex  medical decision making, advance care planning, and clarification of goals. Return in 8 weeks or prn.  This visit was coded based on medical decision making (MDM).  PPS: 50%  HOSPICE ELIGIBILITY/DIAGNOSIS: TBD  Chief Complaint: Palliative Medicine follow up visit.   HISTORY OF PRESENT ILLNESS:  Holly Rose a 88y.o. year old female  with dementia, T2DM, depression, stroke, TIA, hallucinations, anemia, osteomyelitis of foot, breast CA-on tamoxifen, hx of NSTEMI.      [facility]. She is s/p fall in the past week where she had went to bathroom unassisted. No apparent injury reported. Patient does ambulate ad lib about unit. She has daily caregivers, husband visits daily. Patient with labile mood. She does exhibit anxiety, agitation at times, occasional combativeness. Her appetite remains good; eating 75-100% of meals. No recent infections.   Patient received sitting in common area. She is cooperative today. She denies pain, shortness of breath, nausea, constipation. HPI obtained primarily from staff d/t her dementia. She is able to answer direct questions.   History obtained from review of EMR, discussion with primary team, and interview with family, facility staff/caregiver and/or [name]  I reviewed available labs, medications, imaging, studies and related documents from the EMR.  Records reviewed and summarized above.    Physical Exam: Pulse 76, resp 16, b/p120/70,  sats 97% on room air Weight: 116 pounds Constitutional: NAD General: frail appearing, thin EYES: anicteric sclera, lids intact, no discharge  ENMT: intact hearing,  oral mucous membranes moist CV: S1S2, RRR, trace LE edema Pulmonary: LCTA, no increased work of breathing, no cough, room air Abdomen: normo-active BS + 4 quadrants, soft and non tender GU: deferred MSK: moves all extremities, ambulatory Skin: warm and dry, no rashes or wounds on visible skin Neuro: + generalized weakness,  A & O to person, familiars Psych: non-anxious affect Hem/lymph/immuno: no widespread bruising   Thank you for the opportunity to participate in the care of Holly Rose.  The palliative care team will continue to follow. Please call our office at [phone] if we can be of additional assistance.   Ezekiel Slocumb, NP   COVID-19 PATIENT SCREENING TOOL Asked and negative response unless otherwise noted:   Have you had symptoms of covid, tested positive or been in contact with someone with symptoms/positive test in the past 5-10 days? No

## 2022-06-02 DIAGNOSIS — F03918 Unspecified dementia, unspecified severity, with other behavioral disturbance: Secondary | ICD-10-CM | POA: Diagnosis not present

## 2022-06-02 DIAGNOSIS — Z9181 History of falling: Secondary | ICD-10-CM | POA: Diagnosis not present

## 2022-06-02 DIAGNOSIS — R296 Repeated falls: Secondary | ICD-10-CM | POA: Diagnosis not present

## 2022-06-02 DIAGNOSIS — R2689 Other abnormalities of gait and mobility: Secondary | ICD-10-CM | POA: Diagnosis not present

## 2022-06-02 DIAGNOSIS — R279 Unspecified lack of coordination: Secondary | ICD-10-CM | POA: Diagnosis not present

## 2022-06-02 DIAGNOSIS — M6281 Muscle weakness (generalized): Secondary | ICD-10-CM | POA: Diagnosis not present

## 2022-06-04 DIAGNOSIS — F03918 Unspecified dementia, unspecified severity, with other behavioral disturbance: Secondary | ICD-10-CM | POA: Diagnosis not present

## 2022-06-04 DIAGNOSIS — Z9181 History of falling: Secondary | ICD-10-CM | POA: Diagnosis not present

## 2022-06-04 DIAGNOSIS — R279 Unspecified lack of coordination: Secondary | ICD-10-CM | POA: Diagnosis not present

## 2022-06-04 DIAGNOSIS — R2689 Other abnormalities of gait and mobility: Secondary | ICD-10-CM | POA: Diagnosis not present

## 2022-06-04 DIAGNOSIS — R296 Repeated falls: Secondary | ICD-10-CM | POA: Diagnosis not present

## 2022-06-04 DIAGNOSIS — M6281 Muscle weakness (generalized): Secondary | ICD-10-CM | POA: Diagnosis not present

## 2022-06-05 DIAGNOSIS — F03918 Unspecified dementia, unspecified severity, with other behavioral disturbance: Secondary | ICD-10-CM | POA: Diagnosis not present

## 2022-06-05 DIAGNOSIS — M6281 Muscle weakness (generalized): Secondary | ICD-10-CM | POA: Diagnosis not present

## 2022-06-05 DIAGNOSIS — R296 Repeated falls: Secondary | ICD-10-CM | POA: Diagnosis not present

## 2022-06-05 DIAGNOSIS — R2689 Other abnormalities of gait and mobility: Secondary | ICD-10-CM | POA: Diagnosis not present

## 2022-06-05 DIAGNOSIS — Z9181 History of falling: Secondary | ICD-10-CM | POA: Diagnosis not present

## 2022-06-05 DIAGNOSIS — R279 Unspecified lack of coordination: Secondary | ICD-10-CM | POA: Diagnosis not present

## 2022-06-05 IMAGING — DX DG CHEST 1V PORT
1 series · 1 of 1 positions shown · non-contrast
Comparison: 04/16/2020

CLINICAL DATA: Sepsis.

EXAM:
PORTABLE CHEST 1 VIEW

[chest ap]
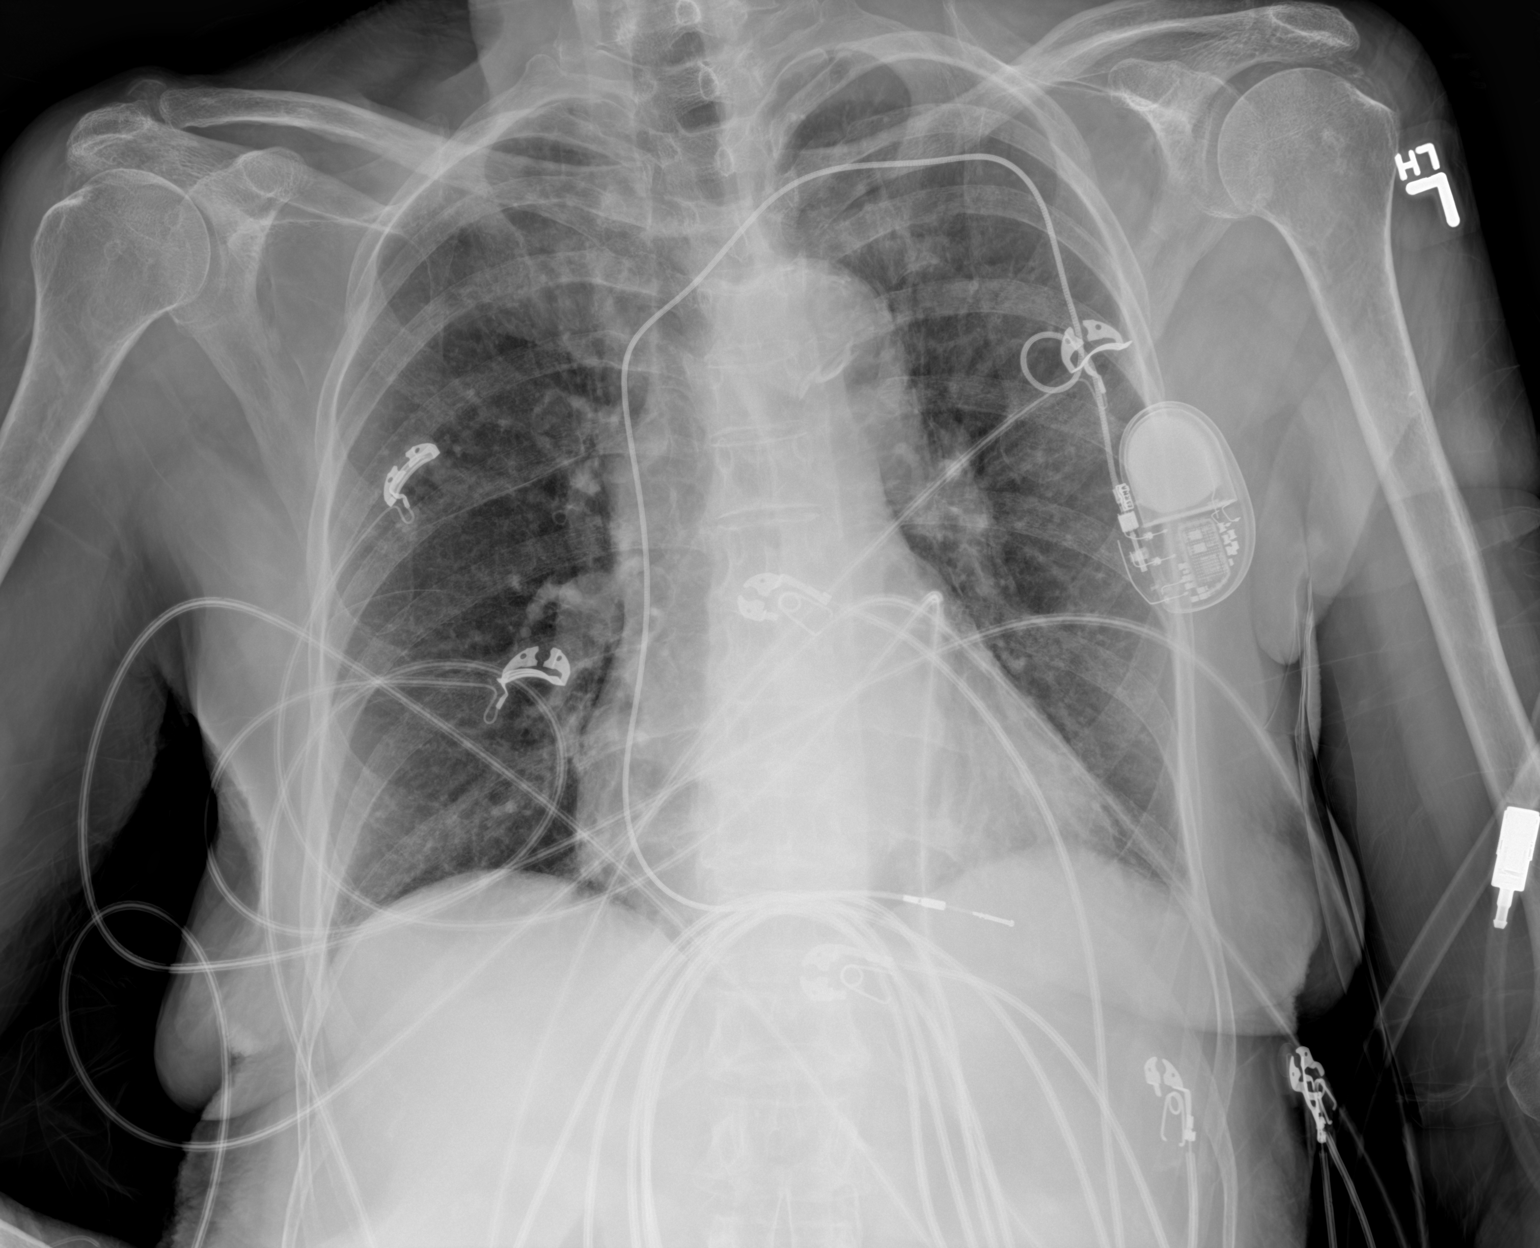

[1 of 1 positions shown; findings below may reference images not displayed]

FINDINGS: Single lead pacer tip: Right ventricle. Atherosclerotic
calcification of the aortic arch.

The lungs appear clear. There is evidence of calcific rotator cuff
tendinopathy. No blunting of the costophrenic angles.
IMPRESSION: No acute thoracic findings.

1.  Aortic Atherosclerosis ([id number].Y).
2. Suspected calcific rotator cuff tendinopathy bilaterally.

## 2022-06-09 DIAGNOSIS — R2689 Other abnormalities of gait and mobility: Secondary | ICD-10-CM | POA: Diagnosis not present

## 2022-06-09 DIAGNOSIS — R410 Disorientation, unspecified: Secondary | ICD-10-CM | POA: Diagnosis not present

## 2022-06-09 DIAGNOSIS — R296 Repeated falls: Secondary | ICD-10-CM | POA: Diagnosis not present

## 2022-06-09 DIAGNOSIS — M6281 Muscle weakness (generalized): Secondary | ICD-10-CM | POA: Diagnosis not present

## 2022-06-09 DIAGNOSIS — Z9181 History of falling: Secondary | ICD-10-CM | POA: Diagnosis not present

## 2022-06-09 DIAGNOSIS — N390 Urinary tract infection, site not specified: Secondary | ICD-10-CM | POA: Diagnosis not present

## 2022-06-09 DIAGNOSIS — R279 Unspecified lack of coordination: Secondary | ICD-10-CM | POA: Diagnosis not present

## 2022-06-09 DIAGNOSIS — F03918 Unspecified dementia, unspecified severity, with other behavioral disturbance: Secondary | ICD-10-CM | POA: Diagnosis not present

## 2022-06-10 DIAGNOSIS — R2689 Other abnormalities of gait and mobility: Secondary | ICD-10-CM | POA: Diagnosis not present

## 2022-06-10 DIAGNOSIS — F03918 Unspecified dementia, unspecified severity, with other behavioral disturbance: Secondary | ICD-10-CM | POA: Diagnosis not present

## 2022-06-10 DIAGNOSIS — Z9181 History of falling: Secondary | ICD-10-CM | POA: Diagnosis not present

## 2022-06-10 DIAGNOSIS — R296 Repeated falls: Secondary | ICD-10-CM | POA: Diagnosis not present

## 2022-06-10 DIAGNOSIS — R279 Unspecified lack of coordination: Secondary | ICD-10-CM | POA: Diagnosis not present

## 2022-06-10 DIAGNOSIS — M6281 Muscle weakness (generalized): Secondary | ICD-10-CM | POA: Diagnosis not present

## 2022-06-12 DIAGNOSIS — Z9181 History of falling: Secondary | ICD-10-CM | POA: Diagnosis not present

## 2022-06-12 DIAGNOSIS — F03918 Unspecified dementia, unspecified severity, with other behavioral disturbance: Secondary | ICD-10-CM | POA: Diagnosis not present

## 2022-06-12 DIAGNOSIS — R296 Repeated falls: Secondary | ICD-10-CM | POA: Diagnosis not present

## 2022-06-12 DIAGNOSIS — R279 Unspecified lack of coordination: Secondary | ICD-10-CM | POA: Diagnosis not present

## 2022-06-12 DIAGNOSIS — R2689 Other abnormalities of gait and mobility: Secondary | ICD-10-CM | POA: Diagnosis not present

## 2022-06-12 DIAGNOSIS — M6281 Muscle weakness (generalized): Secondary | ICD-10-CM | POA: Diagnosis not present

## 2022-06-15 DIAGNOSIS — I25110 Atherosclerotic heart disease of native coronary artery with unstable angina pectoris: Secondary | ICD-10-CM | POA: Diagnosis not present

## 2022-06-15 DIAGNOSIS — N183 Chronic kidney disease, stage 3 unspecified: Secondary | ICD-10-CM | POA: Diagnosis not present

## 2022-06-15 DIAGNOSIS — F419 Anxiety disorder, unspecified: Secondary | ICD-10-CM | POA: Diagnosis not present

## 2022-06-15 DIAGNOSIS — Z9181 History of falling: Secondary | ICD-10-CM | POA: Diagnosis not present

## 2022-06-15 DIAGNOSIS — M6281 Muscle weakness (generalized): Secondary | ICD-10-CM | POA: Diagnosis not present

## 2022-06-15 DIAGNOSIS — Z853 Personal history of malignant neoplasm of breast: Secondary | ICD-10-CM | POA: Diagnosis not present

## 2022-06-15 DIAGNOSIS — E785 Hyperlipidemia, unspecified: Secondary | ICD-10-CM | POA: Diagnosis not present

## 2022-06-15 DIAGNOSIS — Z950 Presence of cardiac pacemaker: Secondary | ICD-10-CM | POA: Diagnosis not present

## 2022-06-15 DIAGNOSIS — F3340 Major depressive disorder, recurrent, in remission, unspecified: Secondary | ICD-10-CM | POA: Diagnosis not present

## 2022-06-15 DIAGNOSIS — I4811 Longstanding persistent atrial fibrillation: Secondary | ICD-10-CM | POA: Diagnosis not present

## 2022-06-15 DIAGNOSIS — I495 Sick sinus syndrome: Secondary | ICD-10-CM | POA: Diagnosis not present

## 2022-06-15 DIAGNOSIS — M810 Age-related osteoporosis without current pathological fracture: Secondary | ICD-10-CM | POA: Diagnosis not present

## 2022-06-15 DIAGNOSIS — R2689 Other abnormalities of gait and mobility: Secondary | ICD-10-CM | POA: Diagnosis not present

## 2022-06-15 DIAGNOSIS — M791 Myalgia, unspecified site: Secondary | ICD-10-CM | POA: Diagnosis not present

## 2022-06-15 DIAGNOSIS — R296 Repeated falls: Secondary | ICD-10-CM | POA: Diagnosis not present

## 2022-06-15 DIAGNOSIS — Z593 Problems related to living in residential institution: Secondary | ICD-10-CM | POA: Diagnosis not present

## 2022-06-15 DIAGNOSIS — R279 Unspecified lack of coordination: Secondary | ICD-10-CM | POA: Diagnosis not present

## 2022-06-15 DIAGNOSIS — E1122 Type 2 diabetes mellitus with diabetic chronic kidney disease: Secondary | ICD-10-CM | POA: Diagnosis not present

## 2022-06-15 DIAGNOSIS — F03918 Unspecified dementia, unspecified severity, with other behavioral disturbance: Secondary | ICD-10-CM | POA: Diagnosis not present

## 2022-06-17 DIAGNOSIS — F03918 Unspecified dementia, unspecified severity, with other behavioral disturbance: Secondary | ICD-10-CM | POA: Diagnosis not present

## 2022-06-17 DIAGNOSIS — R296 Repeated falls: Secondary | ICD-10-CM | POA: Diagnosis not present

## 2022-06-17 DIAGNOSIS — R279 Unspecified lack of coordination: Secondary | ICD-10-CM | POA: Diagnosis not present

## 2022-06-17 DIAGNOSIS — M6281 Muscle weakness (generalized): Secondary | ICD-10-CM | POA: Diagnosis not present

## 2022-06-17 DIAGNOSIS — Z9181 History of falling: Secondary | ICD-10-CM | POA: Diagnosis not present

## 2022-06-17 DIAGNOSIS — R2689 Other abnormalities of gait and mobility: Secondary | ICD-10-CM | POA: Diagnosis not present

## 2022-06-18 DIAGNOSIS — R296 Repeated falls: Secondary | ICD-10-CM | POA: Diagnosis not present

## 2022-06-18 DIAGNOSIS — Z9181 History of falling: Secondary | ICD-10-CM | POA: Diagnosis not present

## 2022-06-18 DIAGNOSIS — R2689 Other abnormalities of gait and mobility: Secondary | ICD-10-CM | POA: Diagnosis not present

## 2022-06-18 DIAGNOSIS — M6281 Muscle weakness (generalized): Secondary | ICD-10-CM | POA: Diagnosis not present

## 2022-06-18 DIAGNOSIS — R279 Unspecified lack of coordination: Secondary | ICD-10-CM | POA: Diagnosis not present

## 2022-06-18 DIAGNOSIS — F03918 Unspecified dementia, unspecified severity, with other behavioral disturbance: Secondary | ICD-10-CM | POA: Diagnosis not present

## 2022-06-19 ENCOUNTER — Non-Acute Institutional Stay: Payer: Medicare Other | Admitting: Student

## 2022-06-19 DIAGNOSIS — N390 Urinary tract infection, site not specified: Secondary | ICD-10-CM | POA: Diagnosis not present

## 2022-06-19 DIAGNOSIS — F03918 Unspecified dementia, unspecified severity, with other behavioral disturbance: Secondary | ICD-10-CM | POA: Diagnosis not present

## 2022-06-19 DIAGNOSIS — Z515 Encounter for palliative care: Secondary | ICD-10-CM

## 2022-06-19 NOTE — Progress Notes (Signed)
  [facility] Consult Note Telephone: [phone]  Fax: [phone]    Date of encounter: 06/19/22 11:01 AM PATIENT NAME: Holly Rose [address] [place] [address]   [phone] (home)  DOB: 03/29/1930 MRN: [id number] PRIMARY CARE PROVIDER:    Anderson, Marshall W, MD,  [address] [facility] [place] [address] [phone]  REFERRING PROVIDER:   Anderson, Marshall W, MD [address] [facility] [place],  [place] [address] [phone]  RESPONSIBLE PARTY:    Contact Information     Name Relation Home Work Mobile   Roblero,[name] Relative   [phone]   Drabik,Samuel T Spouse [phone]  [phone]   Newlin,Frankie Daughter   [phone]        I met face to face with patient in the facility. Palliative Care was asked to follow this patient by consultation request of  Anderson, Marshall W, MD to address advance care planning and complex medical decision making. This is a follow up visit.                                   ASSESSMENT AND PLAN / RECOMMENDATIONS:   Advance Care Planning/Goals of Care: Goals include to maximize quality of life and symptom management. Patient/health care surrogate gave his/her permission to discuss. CODE STATUS: DNR  Palliative Medicine will continue to provide supportive care, symptom management as needed. Spoke with DIL [name].   Symptom Management/Plan:  Dementia w/ behavioral disturbances-patient resides on AL unit. Patient with anxiety and agitation. Staff during the day report her behaviors have been stable; behaviors worsen in the evening/late night. DIL also reports notes per caregivers report worsening behaviors at night. Recommend adding haldol 1 mg at 8pm; continue 1mg at 8am and 4 pm. Continue lorazepam and olanzapine as directed. staff to reorient and redirect as needed. Monitor for falls/safety. Continue daily caregivers.  She is followed by [facility]. Discussed with nurse Tabitha.  UTI-urine culture reviewed. Continue Macrobid BID X 7 days as ordered per PCP.   Follow up Palliative Care Visit: Palliative care will continue to follow for complex medical decision making, advance care planning, and clarification of goals. Return in 4-6 weeks or prn.  This visit was coded based on medical decision making (MDM).  PPS: 50%  HOSPICE ELIGIBILITY/DIAGNOSIS: TBD  Chief Complaint: Palliative Medicine follow up visit.   HISTORY OF PRESENT ILLNESS:  Holly Rose is a 88 y.o. year old female  with dementia, T2DM, depression, stroke, TIA, hallucinations, anemia, osteomyelitis of foot, breast CA-on tamoxifen, hx of NSTEMI.       Patient resides at [facility] on AL unit. DIL report worsening behaviors in the evening/night per her private caregiver notes. Staff report behaviors being better managed during the day. She has been in w/c due to having increased weakness and confusion. Staff report this is improving since starting on antibiotics for UTI. She denies pain, shortness of breath, nausea, constipation. Her appetite has been good. Patient received sitting in common area. She is calm, cooperative. No anxiety or agitation observed. Staff contribute to HPI due to her dementia.  History obtained from review of EMR, discussion with primary team, and interview with family, facility staff/caregiver and/or Ms. Keating.  I reviewed available labs, medications, imaging, studies and related documents from the EMR.  Records reviewed and summarized above.   ROS  ROS obtained primarily   from staff d/t her dementia. She is able to answer direct questions.   Physical Exam: Weight: 125.5 pounds Pulse 80, resp 16, sats 97% on room Constitutional: NAD General: frail appearing, thin EYES: anicteric sclera, lids intact, no discharge  ENMT: intact hearing, oral mucous membranes moist CV: S1S2, RRR, no LE edema Pulmonary:  LCTA, no increased work of breathing, no cough, room air Abdomen: normo-active BS + 4 quadrants, soft and non tender GU: deferred MSK: moves all extremities, ambulatory Skin: warm and dry, no rashes or wounds on visible skin Neuro: + generalized weakness Psych: non-anxious affect, A and O to person, familiars Hem/lymph/immuno: no widespread bruising   Thank you for the opportunity to participate in the care of Holly Rose.  The palliative care team will continue to follow. Please call our office at [phone] if we can be of additional assistance.    S , NP   COVID-19 PATIENT SCREENING TOOL Asked and negative response unless otherwise noted:   Have you had symptoms of covid, tested positive or been in contact with someone with symptoms/positive test in the past 5-10 days? No  

## 2022-06-22 DIAGNOSIS — F419 Anxiety disorder, unspecified: Secondary | ICD-10-CM | POA: Diagnosis not present

## 2022-06-22 DIAGNOSIS — F01C2 Vascular dementia, severe, with psychotic disturbance: Secondary | ICD-10-CM | POA: Diagnosis not present

## 2022-06-24 DIAGNOSIS — Z9181 History of falling: Secondary | ICD-10-CM | POA: Diagnosis not present

## 2022-06-24 DIAGNOSIS — M6281 Muscle weakness (generalized): Secondary | ICD-10-CM | POA: Diagnosis not present

## 2022-06-24 DIAGNOSIS — R296 Repeated falls: Secondary | ICD-10-CM | POA: Diagnosis not present

## 2022-06-24 DIAGNOSIS — R2689 Other abnormalities of gait and mobility: Secondary | ICD-10-CM | POA: Diagnosis not present

## 2022-06-24 DIAGNOSIS — R279 Unspecified lack of coordination: Secondary | ICD-10-CM | POA: Diagnosis not present

## 2022-06-24 DIAGNOSIS — F03918 Unspecified dementia, unspecified severity, with other behavioral disturbance: Secondary | ICD-10-CM | POA: Diagnosis not present

## 2022-06-25 DIAGNOSIS — F03918 Unspecified dementia, unspecified severity, with other behavioral disturbance: Secondary | ICD-10-CM | POA: Diagnosis not present

## 2022-06-25 DIAGNOSIS — R2689 Other abnormalities of gait and mobility: Secondary | ICD-10-CM | POA: Diagnosis not present

## 2022-06-25 DIAGNOSIS — R279 Unspecified lack of coordination: Secondary | ICD-10-CM | POA: Diagnosis not present

## 2022-06-25 DIAGNOSIS — R296 Repeated falls: Secondary | ICD-10-CM | POA: Diagnosis not present

## 2022-06-25 DIAGNOSIS — M6281 Muscle weakness (generalized): Secondary | ICD-10-CM | POA: Diagnosis not present

## 2022-06-25 DIAGNOSIS — Z9181 History of falling: Secondary | ICD-10-CM | POA: Diagnosis not present

## 2022-06-30 DIAGNOSIS — R2689 Other abnormalities of gait and mobility: Secondary | ICD-10-CM | POA: Diagnosis not present

## 2022-06-30 DIAGNOSIS — R296 Repeated falls: Secondary | ICD-10-CM | POA: Diagnosis not present

## 2022-06-30 DIAGNOSIS — M6281 Muscle weakness (generalized): Secondary | ICD-10-CM | POA: Diagnosis not present

## 2022-06-30 DIAGNOSIS — Z9181 History of falling: Secondary | ICD-10-CM | POA: Diagnosis not present

## 2022-06-30 DIAGNOSIS — F03918 Unspecified dementia, unspecified severity, with other behavioral disturbance: Secondary | ICD-10-CM | POA: Diagnosis not present

## 2022-06-30 DIAGNOSIS — R279 Unspecified lack of coordination: Secondary | ICD-10-CM | POA: Diagnosis not present

## 2022-07-01 DIAGNOSIS — T466X5A Adverse effect of antihyperlipidemic and antiarteriosclerotic drugs, initial encounter: Secondary | ICD-10-CM | POA: Diagnosis not present

## 2022-07-01 DIAGNOSIS — M791 Myalgia, unspecified site: Secondary | ICD-10-CM | POA: Diagnosis not present

## 2022-07-01 DIAGNOSIS — F3340 Major depressive disorder, recurrent, in remission, unspecified: Secondary | ICD-10-CM | POA: Diagnosis not present

## 2022-07-01 DIAGNOSIS — I495 Sick sinus syndrome: Secondary | ICD-10-CM | POA: Diagnosis not present

## 2022-07-01 DIAGNOSIS — Z593 Problems related to living in residential institution: Secondary | ICD-10-CM | POA: Diagnosis not present

## 2022-07-01 DIAGNOSIS — F03918 Unspecified dementia, unspecified severity, with other behavioral disturbance: Secondary | ICD-10-CM | POA: Diagnosis not present

## 2022-07-02 DIAGNOSIS — R2689 Other abnormalities of gait and mobility: Secondary | ICD-10-CM | POA: Diagnosis not present

## 2022-07-02 DIAGNOSIS — R279 Unspecified lack of coordination: Secondary | ICD-10-CM | POA: Diagnosis not present

## 2022-07-02 DIAGNOSIS — F03918 Unspecified dementia, unspecified severity, with other behavioral disturbance: Secondary | ICD-10-CM | POA: Diagnosis not present

## 2022-07-02 DIAGNOSIS — M6281 Muscle weakness (generalized): Secondary | ICD-10-CM | POA: Diagnosis not present

## 2022-07-02 DIAGNOSIS — R296 Repeated falls: Secondary | ICD-10-CM | POA: Diagnosis not present

## 2022-07-02 DIAGNOSIS — Z9181 History of falling: Secondary | ICD-10-CM | POA: Diagnosis not present

## 2022-07-15 DIAGNOSIS — R8290 Unspecified abnormal findings in urine: Secondary | ICD-10-CM | POA: Diagnosis not present

## 2022-07-15 DIAGNOSIS — N399 Disorder of urinary system, unspecified: Secondary | ICD-10-CM | POA: Diagnosis not present

## 2022-07-15 DIAGNOSIS — R350 Frequency of micturition: Secondary | ICD-10-CM | POA: Diagnosis not present

## 2022-07-24 ENCOUNTER — Non-Acute Institutional Stay: Payer: Medicare Other | Admitting: Student

## 2022-07-24 DIAGNOSIS — Z515 Encounter for palliative care: Secondary | ICD-10-CM | POA: Diagnosis not present

## 2022-07-24 DIAGNOSIS — F03918 Unspecified dementia, unspecified severity, with other behavioral disturbance: Secondary | ICD-10-CM | POA: Diagnosis not present

## 2022-07-24 NOTE — Progress Notes (Signed)
[facility] Consult Note Telephone: [phone]  Fax: [phone]    Date of encounter: 07/24/22 3:11 PM PATIENT NAME: Holly Rose [address]   [phone] (home)  DOB: 03-09-1930 MRN: [id number] PRIMARY CARE PROVIDER:    Kirk Ruths, MD,  [place] [address] [phone]  REFERRING PROVIDER:   Kirk Ruths, MD [place] [facility] [place],  [place] [address] [phone]  RESPONSIBLE PARTY:    Contact Information     Name Relation Home Work [place] Relative   [phone]   Palmer, Shorey [phone]  [phone]   [facility] Daughter   [phone]        I met face to face with patient in the facility. Palliative Care was asked to follow this patient by consultation request of  Holly Ruths, MD to address advance care planning and complex medical decision making. This is a follow up visit.                                   ASSESSMENT AND PLAN / RECOMMENDATIONS:   Advance Care Planning/Goals of Care: Goals include to maximize quality of life and symptom management. Patient/health care surrogate gave his/her permission to discuss. Our advance care planning conversation included a discussion about:    The value and importance of advance care planning  Experiences with loved ones who have been seriously ill or have died  Exploration of personal, cultural or spiritual beliefs that might influence medical decisions  Exploration of goals of care in the event of a sudden injury or illness  CODE STATUS: DNR  Education provided on Palliative Medicine vs hospice services. Will provide supportive care, symptom management as needed. Spoke with DIL; discussed functional and cognitive declines.   Symptom Management/Plan:  Dementia with behavioral disturbances-Patient  resides on AL unit. She is no longer ambulating; w/c used for locomotion. Behaviors usually worse later in the evening. Monitor for falls/safety as patient has very poor safety awareness. Continue daily caregivers. Staff to reorient/redirect as needed. Will monitor for further cognitive and functional declines, weight loss. She is followed by [facility]. Continue haldol 26m TID, lorazepam 0.5 mg TID & lorazepam 0.5 mg QD PRN, olanzapine 2.5 mg BID & 5 mg daily.    Follow up Palliative Care Visit: Palliative care will continue to follow for complex medical decision making, advance care planning, and clarification of goals. Return in 6-8 weeks or prn.   This visit was coded based on medical decision making (MDM).  PPS: 40%  HOSPICE ELIGIBILITY/DIAGNOSIS: TBD  Chief Complaint: Palliative Medicine follow up visit.   HISTORY OF PRESENT ILLNESS:  Holly Rose a 88y.o. year old female  with dementia, T2DM, depression, stroke, TIA, hallucinations, anemia, osteomyelitis of foot, breast CA-on tamoxifen, hx of NSTEMI.       [facility]. Patient with functional and cognitive decline over the past month. She has several falls back to back. Is now in w/c. She has increased weakness, poor safety awareness, attempts to get up unassisted. She requires more cueing, redirection. No worsening of hallucinations. She is still eating okay, although requires assist with feeding now. Patient received eating breakfast. Today she ate 100% of breakfast, HHA assisted with feeding. She was assisted to living  area after breakfast. She responds to questions, calm and cooperative during visit. She has worsening behaviors in the evening. Caregivers from 4-11pm, 11-7 am. Patient's husband will be moving to AL. Patient's weight was 118 pounds on 11/1. Last month's weight of 125.5 was an actual error; 9/1 weight 116.8 pounds, 8/1 121.9 pounds.  History obtained from review of EMR, discussion  with primary team, and interview with family, facility staff/caregiver and/or Ms. Holly Rose.  I reviewed available labs, medications, imaging, studies and related documents from the EMR.  Records reviewed and summarized above.   ROS  ROS obtained primarily from staff d/t her dementia. She is able to answer direct questions.    Physical Exam: Weight: 118 pounds.  Pulse 80, resp 16, b/p 120/68 Constitutional: NAD General: frail appearing, thin EYES: anicteric sclera, lids intact, no discharge  ENMT: intact hearing, oral mucous membranes moist CV: S1S2, RRR, no LE edema Pulmonary: LCTA, no increased work of breathing, no cough, room air Abdomen: normo-active BS + 4 quadrants, soft and non tender, no ascites. GU: deferred MSK: no sarcopenia, moves all extremities Skin: warm and dry, no rashes or wounds on visible skin Neuro: +generalized weakness,  + cognitive impairment Psych: non-anxious affect, A and O x to person, familiars Hem/lymph/immuno: no widespread bruising   Thank you for the opportunity to participate in the care of Ms. Holly Rose. Please call our office at [phone] if we can be of additional assistance.   Holly Slocumb, NP   COVID-19 PATIENT SCREENING TOOL Asked and negative response unless otherwise noted:   Have you had symptoms of covid, tested positive or been in contact with someone with symptoms/positive test in the past 5-10 days? No

## 2022-08-03 DIAGNOSIS — Z853 Personal history of malignant neoplasm of breast: Secondary | ICD-10-CM | POA: Diagnosis not present

## 2022-08-03 DIAGNOSIS — I69818 Other symptoms and signs involving cognitive functions following other cerebrovascular disease: Secondary | ICD-10-CM | POA: Diagnosis not present

## 2022-08-03 DIAGNOSIS — N183 Chronic kidney disease, stage 3 unspecified: Secondary | ICD-10-CM | POA: Diagnosis not present

## 2022-08-03 DIAGNOSIS — H409 Unspecified glaucoma: Secondary | ICD-10-CM | POA: Diagnosis not present

## 2022-08-03 DIAGNOSIS — E1122 Type 2 diabetes mellitus with diabetic chronic kidney disease: Secondary | ICD-10-CM | POA: Diagnosis not present

## 2022-08-03 DIAGNOSIS — F0153 Vascular dementia, unspecified severity, with mood disturbance: Secondary | ICD-10-CM | POA: Diagnosis not present

## 2022-08-03 DIAGNOSIS — Z950 Presence of cardiac pacemaker: Secondary | ICD-10-CM | POA: Diagnosis not present

## 2022-08-03 DIAGNOSIS — M810 Age-related osteoporosis without current pathological fracture: Secondary | ICD-10-CM | POA: Diagnosis not present

## 2022-08-03 DIAGNOSIS — Z8673 Personal history of transient ischemic attack (TIA), and cerebral infarction without residual deficits: Secondary | ICD-10-CM | POA: Diagnosis not present

## 2022-08-03 DIAGNOSIS — I495 Sick sinus syndrome: Secondary | ICD-10-CM | POA: Diagnosis not present

## 2022-08-03 DIAGNOSIS — E785 Hyperlipidemia, unspecified: Secondary | ICD-10-CM | POA: Diagnosis not present

## 2022-08-03 DIAGNOSIS — J302 Other seasonal allergic rhinitis: Secondary | ICD-10-CM | POA: Diagnosis not present

## 2022-08-03 DIAGNOSIS — I4891 Unspecified atrial fibrillation: Secondary | ICD-10-CM | POA: Diagnosis not present

## 2022-08-03 DIAGNOSIS — Z87440 Personal history of urinary (tract) infections: Secondary | ICD-10-CM | POA: Diagnosis not present

## 2022-08-03 DIAGNOSIS — I252 Old myocardial infarction: Secondary | ICD-10-CM | POA: Diagnosis not present

## 2022-08-03 DIAGNOSIS — I25119 Atherosclerotic heart disease of native coronary artery with unspecified angina pectoris: Secondary | ICD-10-CM | POA: Diagnosis not present

## 2022-08-04 DIAGNOSIS — I69818 Other symptoms and signs involving cognitive functions following other cerebrovascular disease: Secondary | ICD-10-CM | POA: Diagnosis not present

## 2022-08-04 DIAGNOSIS — I25119 Atherosclerotic heart disease of native coronary artery with unspecified angina pectoris: Secondary | ICD-10-CM | POA: Diagnosis not present

## 2022-08-04 DIAGNOSIS — I252 Old myocardial infarction: Secondary | ICD-10-CM | POA: Diagnosis not present

## 2022-08-04 DIAGNOSIS — E1122 Type 2 diabetes mellitus with diabetic chronic kidney disease: Secondary | ICD-10-CM | POA: Diagnosis not present

## 2022-08-04 DIAGNOSIS — I4891 Unspecified atrial fibrillation: Secondary | ICD-10-CM | POA: Diagnosis not present

## 2022-08-04 DIAGNOSIS — F0153 Vascular dementia, unspecified severity, with mood disturbance: Secondary | ICD-10-CM | POA: Diagnosis not present

## 2022-08-05 DIAGNOSIS — I4891 Unspecified atrial fibrillation: Secondary | ICD-10-CM | POA: Diagnosis not present

## 2022-08-05 DIAGNOSIS — I69818 Other symptoms and signs involving cognitive functions following other cerebrovascular disease: Secondary | ICD-10-CM | POA: Diagnosis not present

## 2022-08-05 DIAGNOSIS — E1122 Type 2 diabetes mellitus with diabetic chronic kidney disease: Secondary | ICD-10-CM | POA: Diagnosis not present

## 2022-08-05 DIAGNOSIS — F0153 Vascular dementia, unspecified severity, with mood disturbance: Secondary | ICD-10-CM | POA: Diagnosis not present

## 2022-08-05 DIAGNOSIS — I252 Old myocardial infarction: Secondary | ICD-10-CM | POA: Diagnosis not present

## 2022-08-05 DIAGNOSIS — I25119 Atherosclerotic heart disease of native coronary artery with unspecified angina pectoris: Secondary | ICD-10-CM | POA: Diagnosis not present

## 2022-08-06 DIAGNOSIS — I25119 Atherosclerotic heart disease of native coronary artery with unspecified angina pectoris: Secondary | ICD-10-CM | POA: Diagnosis not present

## 2022-08-06 DIAGNOSIS — E1122 Type 2 diabetes mellitus with diabetic chronic kidney disease: Secondary | ICD-10-CM | POA: Diagnosis not present

## 2022-08-06 DIAGNOSIS — I69818 Other symptoms and signs involving cognitive functions following other cerebrovascular disease: Secondary | ICD-10-CM | POA: Diagnosis not present

## 2022-08-06 DIAGNOSIS — I252 Old myocardial infarction: Secondary | ICD-10-CM | POA: Diagnosis not present

## 2022-08-06 DIAGNOSIS — F0153 Vascular dementia, unspecified severity, with mood disturbance: Secondary | ICD-10-CM | POA: Diagnosis not present

## 2022-08-06 DIAGNOSIS — I4891 Unspecified atrial fibrillation: Secondary | ICD-10-CM | POA: Diagnosis not present

## 2022-08-07 DIAGNOSIS — F0153 Vascular dementia, unspecified severity, with mood disturbance: Secondary | ICD-10-CM | POA: Diagnosis not present

## 2022-08-07 DIAGNOSIS — I25119 Atherosclerotic heart disease of native coronary artery with unspecified angina pectoris: Secondary | ICD-10-CM | POA: Diagnosis not present

## 2022-08-07 DIAGNOSIS — I252 Old myocardial infarction: Secondary | ICD-10-CM | POA: Diagnosis not present

## 2022-08-07 DIAGNOSIS — I4891 Unspecified atrial fibrillation: Secondary | ICD-10-CM | POA: Diagnosis not present

## 2022-08-07 DIAGNOSIS — E1122 Type 2 diabetes mellitus with diabetic chronic kidney disease: Secondary | ICD-10-CM | POA: Diagnosis not present

## 2022-08-07 DIAGNOSIS — I69818 Other symptoms and signs involving cognitive functions following other cerebrovascular disease: Secondary | ICD-10-CM | POA: Diagnosis not present

## 2022-08-08 DIAGNOSIS — I4891 Unspecified atrial fibrillation: Secondary | ICD-10-CM | POA: Diagnosis not present

## 2022-08-08 DIAGNOSIS — I252 Old myocardial infarction: Secondary | ICD-10-CM | POA: Diagnosis not present

## 2022-08-08 DIAGNOSIS — E1122 Type 2 diabetes mellitus with diabetic chronic kidney disease: Secondary | ICD-10-CM | POA: Diagnosis not present

## 2022-08-08 DIAGNOSIS — I69818 Other symptoms and signs involving cognitive functions following other cerebrovascular disease: Secondary | ICD-10-CM | POA: Diagnosis not present

## 2022-08-08 DIAGNOSIS — I25119 Atherosclerotic heart disease of native coronary artery with unspecified angina pectoris: Secondary | ICD-10-CM | POA: Diagnosis not present

## 2022-08-08 DIAGNOSIS — F0153 Vascular dementia, unspecified severity, with mood disturbance: Secondary | ICD-10-CM | POA: Diagnosis not present

## 2022-08-11 DIAGNOSIS — F0153 Vascular dementia, unspecified severity, with mood disturbance: Secondary | ICD-10-CM | POA: Diagnosis not present

## 2022-08-11 DIAGNOSIS — N183 Chronic kidney disease, stage 3 unspecified: Secondary | ICD-10-CM | POA: Diagnosis not present

## 2022-08-11 DIAGNOSIS — E1122 Type 2 diabetes mellitus with diabetic chronic kidney disease: Secondary | ICD-10-CM | POA: Diagnosis not present

## 2022-08-11 DIAGNOSIS — I25119 Atherosclerotic heart disease of native coronary artery with unspecified angina pectoris: Secondary | ICD-10-CM | POA: Diagnosis not present

## 2022-08-11 DIAGNOSIS — I4891 Unspecified atrial fibrillation: Secondary | ICD-10-CM | POA: Diagnosis not present

## 2022-08-11 DIAGNOSIS — I252 Old myocardial infarction: Secondary | ICD-10-CM | POA: Diagnosis not present

## 2022-08-11 DIAGNOSIS — I69818 Other symptoms and signs involving cognitive functions following other cerebrovascular disease: Secondary | ICD-10-CM | POA: Diagnosis not present

## 2022-08-14 DEATH — deceased
